# Patient Record
Sex: Female | Born: 1945 | Race: White | Hispanic: No | Marital: Married | Smoking: Never smoker
Health system: Southern US, Community
[De-identification: ages and names within clinical notes are randomized; demographics above are authoritative.]

## PROBLEM LIST (undated history)

## (undated) DIAGNOSIS — E119 Type 2 diabetes mellitus without complications: Secondary | ICD-10-CM

## (undated) DIAGNOSIS — C55 Malignant neoplasm of uterus, part unspecified: Secondary | ICD-10-CM

## (undated) DIAGNOSIS — Z950 Presence of cardiac pacemaker: Secondary | ICD-10-CM

## (undated) DIAGNOSIS — E782 Mixed hyperlipidemia: Secondary | ICD-10-CM

## (undated) DIAGNOSIS — I2510 Atherosclerotic heart disease of native coronary artery without angina pectoris: Secondary | ICD-10-CM

## (undated) DIAGNOSIS — E785 Hyperlipidemia, unspecified: Secondary | ICD-10-CM

## (undated) DIAGNOSIS — I509 Heart failure, unspecified: Secondary | ICD-10-CM

## (undated) DIAGNOSIS — I10 Essential (primary) hypertension: Secondary | ICD-10-CM

## (undated) DIAGNOSIS — G4730 Sleep apnea, unspecified: Secondary | ICD-10-CM

## (undated) DIAGNOSIS — J189 Pneumonia, unspecified organism: Secondary | ICD-10-CM

## (undated) DIAGNOSIS — I119 Hypertensive heart disease without heart failure: Secondary | ICD-10-CM

## (undated) DIAGNOSIS — J45909 Unspecified asthma, uncomplicated: Secondary | ICD-10-CM

## (undated) DIAGNOSIS — N200 Calculus of kidney: Secondary | ICD-10-CM

## (undated) DIAGNOSIS — I480 Paroxysmal atrial fibrillation: Secondary | ICD-10-CM

## (undated) DIAGNOSIS — G459 Transient cerebral ischemic attack, unspecified: Secondary | ICD-10-CM

## (undated) HISTORY — DX: Hyperlipidemia, unspecified: E78.5

## (undated) HISTORY — DX: Essential (primary) hypertension: I10

## (undated) HISTORY — DX: Transient cerebral ischemic attack, unspecified: G45.9

## (undated) HISTORY — PX: LAPAROSCOPIC CHOLECYSTECTOMY: SUR755

## (undated) HISTORY — DX: Malignant neoplasm of uterus, part unspecified: C55

## (undated) HISTORY — PX: INSERT / REPLACE / REMOVE PACEMAKER: SUR710

---
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
## 1973-03-17 HISTORY — PX: VAGINAL HYSTERECTOMY: SUR661

## 2003-03-18 HISTORY — PX: CORONARY ARTERY BYPASS GRAFT: SHX141

## 2003-03-18 HISTORY — PX: CARDIAC CATHETERIZATION: SHX172

## 2003-09-10 ENCOUNTER — Inpatient Hospital Stay (HOSPITAL_COMMUNITY): Admission: EM | Admit: 2003-09-10 | Discharge: 2003-09-15 | Payer: Self-pay | Admitting: Emergency Medicine

## 2003-10-18 ENCOUNTER — Inpatient Hospital Stay (HOSPITAL_COMMUNITY)
Admission: RE | Admit: 2003-10-18 | Discharge: 2003-10-26 | Payer: Self-pay | Admitting: Thoracic Surgery (Cardiothoracic Vascular Surgery)

## 2005-10-10 ENCOUNTER — Ambulatory Visit: Payer: Self-pay | Admitting: Cardiology

## 2010-05-23 ENCOUNTER — Emergency Department (HOSPITAL_COMMUNITY): Payer: Medicaid Other

## 2010-05-23 ENCOUNTER — Observation Stay (HOSPITAL_COMMUNITY)
Admission: EM | Admit: 2010-05-23 | Discharge: 2010-05-27 | Disposition: A | Discharge status: Home / Self Care | Payer: Medicaid Other | Attending: Internal Medicine | Admitting: Internal Medicine

## 2010-05-23 DIAGNOSIS — I10 Essential (primary) hypertension: Secondary | ICD-10-CM | POA: Insufficient documentation

## 2010-05-23 DIAGNOSIS — I2510 Atherosclerotic heart disease of native coronary artery without angina pectoris: Secondary | ICD-10-CM | POA: Insufficient documentation

## 2010-05-23 DIAGNOSIS — G4733 Obstructive sleep apnea (adult) (pediatric): Secondary | ICD-10-CM | POA: Insufficient documentation

## 2010-05-23 DIAGNOSIS — Z7982 Long term (current) use of aspirin: Secondary | ICD-10-CM | POA: Insufficient documentation

## 2010-05-23 DIAGNOSIS — Z79899 Other long term (current) drug therapy: Secondary | ICD-10-CM | POA: Insufficient documentation

## 2010-05-23 DIAGNOSIS — R6889 Other general symptoms and signs: Secondary | ICD-10-CM | POA: Insufficient documentation

## 2010-05-23 DIAGNOSIS — Z794 Long term (current) use of insulin: Secondary | ICD-10-CM | POA: Insufficient documentation

## 2010-05-23 DIAGNOSIS — Z23 Encounter for immunization: Secondary | ICD-10-CM | POA: Insufficient documentation

## 2010-05-23 DIAGNOSIS — E119 Type 2 diabetes mellitus without complications: Secondary | ICD-10-CM | POA: Insufficient documentation

## 2010-05-23 DIAGNOSIS — G459 Transient cerebral ischemic attack, unspecified: Principal | ICD-10-CM | POA: Insufficient documentation

## 2010-05-23 LAB — URINALYSIS, ROUTINE W REFLEX MICROSCOPIC
Hgb urine dipstick: NEGATIVE
Urobilinogen, UA: 1 mg/dL (ref 0.0–1.0)

## 2010-05-23 LAB — COMPREHENSIVE METABOLIC PANEL
Albumin: 3.9 g/dL (ref 3.5–5.2)
Alkaline Phosphatase: 69 U/L (ref 39–117)
BUN: 13 mg/dL (ref 6–23)
CO2: 31 mEq/L (ref 19–32)
Creatinine, Ser: 0.77 mg/dL (ref 0.4–1.2)
GFR calc non Af Amer: >60 mL/min (ref >60)
Potassium: 3.7 mEq/L (ref 3.5–5.1)
Sodium: 135 mEq/L (ref 135–145)
Total Bilirubin: 0.5 mg/dL (ref 0.3–1.2)

## 2010-05-23 LAB — APTT: aPTT: 29 seconds (ref 24–37)

## 2010-05-23 LAB — TROPONIN I: Troponin I: 0.01 ng/mL (ref 0.00–0.06)

## 2010-05-23 LAB — CBC
RBC: 4.39 MIL/uL (ref 3.87–5.11)
WBC: 11.4 10*3/uL — ABNORMAL HIGH (ref 4.0–10.5)

## 2010-05-23 LAB — PROTIME-INR
INR: 0.98 (ref 0.00–1.49)
Prothrombin Time: 13.2 seconds (ref 11.6–15.2)

## 2010-05-23 LAB — CK TOTAL AND CKMB (NOT AT ARMC)
CK, MB: 1.4 ng/mL (ref 0.3–4.0)
Relative Index: INVALID (ref 0.0–2.5)

## 2010-05-23 LAB — DIFFERENTIAL
Basophils Relative: 0 % (ref 0–1)
Eosinophils Absolute: 0.1 10*3/uL (ref 0.0–0.7)

## 2010-05-24 ENCOUNTER — Other Ambulatory Visit (HOSPITAL_COMMUNITY): Payer: Self-pay

## 2010-05-24 ENCOUNTER — Encounter (HOSPITAL_COMMUNITY): Payer: Self-pay | Admitting: Radiology

## 2010-05-24 ENCOUNTER — Inpatient Hospital Stay (HOSPITAL_COMMUNITY): Payer: Medicaid Other

## 2010-05-24 LAB — LIPID PANEL
HDL: 42 mg/dL (ref >39)
Triglycerides: 614 mg/dL — ABNORMAL HIGH (ref <150)
VLDL: UNDETERMINED mg/dL (ref 0–40)

## 2010-05-24 LAB — BRAIN NATRIURETIC PEPTIDE: Pro B Natriuretic peptide (BNP): 51 pg/mL (ref 0.0–100.0)

## 2010-05-24 LAB — GLUCOSE, CAPILLARY
Glucose-Capillary: 134 mg/dL — ABNORMAL HIGH (ref 70–99)
Glucose-Capillary: 137 mg/dL — ABNORMAL HIGH (ref 70–99)
Glucose-Capillary: 141 mg/dL — ABNORMAL HIGH (ref 70–99)

## 2010-05-24 LAB — URINE CULTURE
Culture  Setup Time: 201203082029
Culture: NO GROWTH

## 2010-05-24 LAB — HEMOGLOBIN A1C: Mean Plasma Glucose: 177 mg/dL — ABNORMAL HIGH (ref <117)

## 2010-05-24 MED ORDER — GADOBENATE DIMEGLUMINE 529 MG/ML IV SOLN
20.0000 mL | Freq: Once | INTRAVENOUS | Status: AC
  Administered 2010-05-24: 20 mL via INTRAVENOUS

## 2010-05-25 ENCOUNTER — Observation Stay (HOSPITAL_COMMUNITY): Payer: Medicaid Other

## 2010-05-25 DIAGNOSIS — G459 Transient cerebral ischemic attack, unspecified: Secondary | ICD-10-CM

## 2010-05-25 LAB — COMPREHENSIVE METABOLIC PANEL
Alkaline Phosphatase: 59 U/L (ref 39–117)
BUN: 13 mg/dL (ref 6–23)
Calcium: 9.5 mg/dL (ref 8.4–10.5)
Creatinine, Ser: 0.69 mg/dL (ref 0.4–1.2)
Glucose, Bld: 158 mg/dL — ABNORMAL HIGH (ref 70–99)
Total Protein: 6.3 g/dL (ref 6.0–8.3)

## 2010-05-25 LAB — CBC
HCT: 37.7 % (ref 36.0–46.0)
MCH: 29.5 pg (ref 26.0–34.0)
MCHC: 32.9 g/dL (ref 30.0–36.0)
MCV: 89.5 fL (ref 78.0–100.0)
RDW: 14.8 % (ref 11.5–15.5)

## 2010-05-25 LAB — GLUCOSE, CAPILLARY

## 2010-05-25 LAB — DIFFERENTIAL
Eosinophils Relative: 2 % (ref 0–5)
Lymphocytes Relative: 42 % (ref 12–46)
Lymphs Abs: 3.7 10*3/uL (ref 0.7–4.0)
Monocytes Absolute: 0.6 10*3/uL (ref 0.1–1.0)

## 2010-05-25 LAB — PHOSPHORUS: Phosphorus: 5.2 mg/dL — ABNORMAL HIGH (ref 2.3–4.6)

## 2010-05-25 LAB — MAGNESIUM: Magnesium: 1.8 mg/dL (ref 1.5–2.5)

## 2010-05-26 LAB — PHOSPHORUS: Phosphorus: 4.6 mg/dL (ref 2.3–4.6)

## 2010-05-26 LAB — BASIC METABOLIC PANEL
BUN: 12 mg/dL (ref 6–23)
Chloride: 101 mEq/L (ref 96–112)
Potassium: 4 mEq/L (ref 3.5–5.1)

## 2010-05-26 LAB — MAGNESIUM: Magnesium: 1.9 mg/dL (ref 1.5–2.5)

## 2010-05-26 LAB — GLUCOSE, CAPILLARY: Glucose-Capillary: 195 mg/dL — ABNORMAL HIGH (ref 70–99)

## 2010-05-27 ENCOUNTER — Other Ambulatory Visit (HOSPITAL_COMMUNITY): Payer: Self-pay

## 2010-06-17 NOTE — H&P (Signed)
NAMEBUFFY, EHLER              ACCOUNT NO.:  [id number]  MEDICAL RECORD NO.:  [id number]           PATIENT TYPE:  E  LOCATION:  [facility]                         FACILITY:  [facility]  PHYSICIAN:  Rise Patience, MDDATE OF BIRTH:  [birth year]  DATE OF ADMISSION:  05/23/2010 DATE OF DISCHARGE:                             HISTORY & PHYSICAL   PRIMARY CARE PHYSICIAN:  Dr. [name].  CHIEF COMPLAINT:  Left-sided numbness.  HISTORY OF PRESENT ILLNESS:  A 65 year old female with history of CAD status post CABG, history of atrial fibrillation status post Maze therapy, history of diabetes mellitus type 2, has been experiencing left- sided numbness for last 24 hours, the patient states whenever she stands up, she experiences whole left upper and lower extremity and body getting numb with also some facial numbness.  If she lies on flat, the symptoms resolves.  She has not lost functional for upper or lower extremities, did not loose consciousness, had no difficulty with swallowing, speaking, or any visual symptoms.  Denies any headaches at this time.  Denies chest pain, shortness of breath, cough or phlegm, abdominal pain, dysuria, discharge, or diarrhea.  The patient had a CT of the head which was negative.  Dr. Jannifer Franklin of Neurology had already seen the patient and has advised observation to rule out TIA with MRI and MRA of the brain.  PAST MEDICAL HISTORY:  History of CAD status post CABG, atrial fibrillation status post Maze therapy, presently in AFib, history of hypertension, and diabetes mellitus type 2.  PAST SURGICAL HISTORY:  CAD, status post CABG, Maze therapy, and cholecystectomy.  MEDICATIONS PRIOR TO ADMISSION: 1. Metoprolol 50 mg p.o. daily. 2. Lovaza 1 g 4 capsules daily. 3. NovoLog mix 70/30, takes 20 units subcutaneous twice daily. 4. Vitamin B 1000 units daily. 5. Aspirin 81 mg daily. 6. Vitamin B12. 7. Potassium chloride. 8. Furosemide 40 mg daily. 9.  Metformin 500 mg twice daily.  ALLERGIES:  No known drug allergies.  FAMILY HISTORY:  Significant for the patient's dad having dementia and stroke.  SOCIAL HISTORY:  The patient lives with her husband.  Denies smoking cigarette, drink alcohol, or use illegal drugs.  REVIEW OF SYSTEMS:  As per history of present illness, nothing else significant.  PHYSICAL EXAMINATION:  GENERAL:  The patient examined at bedside, not in acute distress. VITAL SIGNS:  Blood pressure is 148/85, pulse is 26 per minute, temperature 97.5, respirations 18, and O2 sat is 97%. HEENT:  Anicteric.  No pallor.  No facial asymmetry.  Tongue is midline. NECK:  No neck rigidity. CHEST:  Bilateral air entry present.  No rhonchi.  No crepitation. HEART:  S1 and S2 heard. ABDOMEN:  Soft and nontender.  Bowel sounds heard. CNS:  Alert, awake, oriented to time, place, and person.  Moves upper and lower extremities 5/5.  There is no pronator drift.  There is no dysarthria or ataxia.  These were done when the patient was sitting and lying down. EXTREMITIES:  Peripheral pulses felt.  No edema.  LABORATORY DATA:  EKG shows atrial fibrillation with rate around 108 beats per minute with nonspecific ST-T changes.  There has been another EKG done later which shows atrial fibrillation, rate around 72 beats per minute with nonspecific ST-T changes.  CT of the head without contrast shows no acute intracranial abnormalities.  Chest x-rays shows thyromegaly, mild pulmonary vascular congestion, and diffuse interstitial prominence.  Interstitial prominence could be chronic or could reflect mild interstitial pulmonary edema.  CBC, WBC is 11.4, hemoglobin 13.2, hematocrit 39.1, and platelets 286.  PT/INR is 13.2 and 0.9.  Basic metabolic panel, sodium 980, potassium 3.5, chloride 97, carbon dioxide 31, glucose 145, BUN 13, creatinine 0.7, total bilirubin 0.5, alk phos 69, AST 40, ALT 34, total bilirubin 7.3, albumin 3.9, calcium  8.2, CK is 60, CK-MB 1.4, troponin 0.1.  UA is negative for glucose, ketones 15, otherwise negative nitrites and leukocytes.  ASSESSMENT: 1. Possible transient ischemic attack. 2. Abnormal chest x-ray. 3. History of diabetes mellitus type 2. 4. History of coronary artery disease status post coronary artery     bypass graft. 5. History of atrial fibrillation status post Maze therapy.  Presently     heart rate is moderately controlled. 6. History of hyperlipidemia.  PLAN: 1. At this time, we will admit the patient to telemetry. 2. For her possible TIA, that has been already evaluated by Dr.     Jannifer Franklin.  We will be getting MRI and MRA of the brain, 2D echo,     carotid Doppler and at this time I did discuss with Dr. Jannifer Franklin,     about AFib.  We will observe the patient, not to start on any     Coumadin at this time. 3. Abnormal chest x-ray.  I am going to check BNP.  The patient is not     short of breath.  The patient does say that she gets cough everyday     in the morning probably has mild bronchitis.  I am going to start     Avelox 400 mg p.o. daily and we will also get a CT chest without     contrast to rule out any further abnormal disease of the lung like     interstitial lung disease.  If the BNP is high, then probably we     will need to follow her 2D echo. 4. Further recommendations as condition evolves. 5. The patient is a full code.     Rise Patience, MD     ANK/MEDQ  D:  05/23/2010  T:  05/24/2010  Job:  [id number]  cc:   [name]  Electronically Signed by Gean Birchwood MD on 06/17/2010 07:39:23 AM

## 2010-06-18 NOTE — Consult Note (Signed)
[name], Paul              ACCOUNT NO.:  [id number]  MEDICAL RECORD NO.:  [id number]           PATIENT TYPE:  I  LOCATION:  3012                         FACILITY:  [facility]  PHYSICIAN:  Jill Alexanders, M.D.  DATE OF BIRTH:  11-22-1945  DATE OF CONSULTATION:  05/23/2010 DATE OF DISCHARGE:                                CONSULTATION   HISTORY OF PRESENT ILLNESS:  [name] is a 65 year old right- handed white female born on 01-17-1946.  This patient does have history diabetes and hypertension, comes into the emergency room for evaluation of episodes of left-sided numbness that began this morning. The patient was last seen normal on the evening of the May 22, 2010. The patient claims when she woke this morning she noted some right occipital headache.  Since she got out of bed, the patient felt numbness on the face, arm, and leg on the left.  When the patient tried to walk, she would veer to the left, felt as if her left foot was floppy.  The patient did not fall.  The patient notes when she lies down the numbness goes away, but comes back again when she stands up.  The patient denies any visual field changes, still has a bit of a headache on the top of her head.  The patient has undergone a CT scan of the brain that is unremarkable, but does show calcification within the posterior circulation vessels.  Neurology was asked to see this patient for further evaluation.  The patient has no symptoms while lying down.  NIH stroke scale score is zero.  PAST MEDICAL HISTORY:  Significant for: 1. Diabetes. 2. TIA events with left-sided numbness. 3. CABG procedure. 4. Obesity. 5. Hysterectomy. 6. Gallbladder resection. 7. Multiple episodes of pneumonia. 8. History of left face shingles in the past.  MEDICATIONS: 1. NPH 20 units daily. 2. Lasix 40 mg daily. 3. Metformin 500 mg twice daily. 4. Metoprolol 50 mg daily. 5. Potassium 10 mEq daily. 6. Vitamin D 1000 units  daily. 7. B complex vitamins 1 tablet daily. 8. Aspirin 81 mg daily. 9. Lovaza 1000 mg 2 tablets twice daily.  The patient has no known allergies.  SOCIAL HISTORY:  The patient is married; is retired; lives in the [place] area; has two children.  One daughter has degenerative arthritis.  One sister has obesity.  Does not smoke or drink.  FAMILY MEDICAL HISTORY:  Notable that mother is still living with history of coronary artery disease, status post CABG procedure.  Father died with Alzheimer disease, strokes, heart attack.  The patient has one brother, four sisters, siblings have hypertension.  REVIEW OF SYSTEMS:  Notable for no recent fevers, chills.  The patient does note headache.  Denies shortness of breath, chest pains, abdominal pain, nausea, vomiting.  The patient denies any blackout episodes or seizure event.  The patient denies any preexisting history of cerebrovascular disease.  PHYSICAL EXAMINATION:  VITAL SIGNS:  Blood pressure is 136/75, heart rate 68, respiratory rate 20, temperature afebrile.GENERAL:  The patient is a moderately to markedly obese white female who is alert and  cooperative at the time of examination. HEENT:  Head is atraumatic.  Eyes, pupils are equal, round, and reactive to light. NECK:  Supple.  No carotid bruits noted. RESPIRATORY:  Clear. CARDIOVASCULAR:  Regular rate and rhythm.  No obvious murmurs or rubs noted. EXTREMITIES:  Without significant edema. NEUROLOGIC:  Cranial nerves as above.  Facial symmetry is present.  The patient has good sensation of face to pinprick, soft touch bilaterally. She has good strength in facial muscle, muscles of head and trunk, and shoulder shrug bilaterally.  Speech is well enunciated, not aphasic. Motor testing reveals 5/5 strength in all fours.  Good symmetric motor tone is noted throughout.  Sensory testing is intact to pinprick, soft touch, and vibratory sensation throughout.  The  patient has good finger- nose-finger, heel-to-shin.  Gait was not tested.  Deep tendon reflexes are depressed but symmetric.  Toes neutral bilaterally.  No drift seen on the arms or legs.  No evidence of extinction to double simultaneous stimulation is noted.  LABORATORY DATA:  Notable for a white count of 11.4, hemoglobin of 13.2, hematocrit 39.1, MCV of 89.1, platelets of 286, INR 0.98.  Sodium 135, potassium 3.7, chloride of 97, CO2 of 31, glucose of 145, BUN of 13, creatinine of 0.77, total bili 0.5, alk phosphatase 69, SGOT 40, SGPT of 34, total protein 7.3, albumin 3.9, calcium 8.7.  Specific gravity 1.025, pH 5.5, 15 mg/dL ketones, otherwise unremarkable.  IMPRESSION: 1. Probable transient ischemic attack events. 2. Diabetes. 3. Hypertension.  This patient has had multiple events today.  Apparently, all are with standing involving the left face, arm, and leg with some possible weakness on that side as well and gait instability.  The symptoms resolve with lying down.  This patient could be having minor drops in blood pressure with standing associated with a fixed vessel stenosis that resulted in symptomatology.  The patient will deserve further workup for stroke.  NIH stroke scale score is zero.  TPA is not indicated secondary to minimal deficit and duration of symptoms.  1. The patient will be admitted for evaluation. 2. MRI of the head. 3. MRI angiogram of the head and neck. 4. 2D echocardiogram. 5. Aspirin therapy for now.  The patient will be followed while in-house.     Jill Alexanders, M.D.     CKW/MEDQ  D:  05/23/2010  T:  05/24/2010  Job:  [id number]  cc:   [name] [facility]  Electronically Signed by Roxy Cedar M.D. on 06/18/2010 08:29:36 AM

## 2010-06-25 NOTE — Discharge Summary (Signed)
[name], [name]              ACCOUNT NO.:  [id number]  MEDICAL RECORD NO.:  [id number]           PATIENT TYPE:  I  LOCATION:  3012                         FACILITY:  [facility]  PHYSICIAN:  Leana Gamer, MDDATE OF BIRTH:  03/04/1946  DATE OF ADMISSION:  05/23/2010 DATE OF DISCHARGE:  05/27/2010                              DISCHARGE SUMMARY   DISCHARGE DISPOSITION:  Home.  FINAL DISCHARGE DIAGNOSES: 1. Transient ischemic attack. 2. Atrial fibrillation. 3. Diabetes type 2. 4. Hypertension. 5. Abnormal lipids. 6. Coronary artery disease, status post CABG. 7. Numbness on the left lower extremity. 8. Chronic diastolic dysfunction. 9. Right external carotid artery stenosis.  DISCHARGE MEDICATIONS: 1. Aspirin 325 mg p.o. daily. 2. Pravastatin 10 mg p.o. nightly. 3. Lasix 40 mg p.o. daily. 4. Lovaza 1 g 4 capsules p.o. daily. 5. Metformin 500 mg p.o. b.i.d. 6. Metoprolol 50 mg p.o. daily. 7. NovoLog 70/30 20 units subcutaneously b.i.d. 8. Potassium 10 mEq p.o. daily. 9. Vitamin B12 over-the-counter 1 tab p.o. daily. 10.Vitamin D 1000 units p.o. daily.  CONSULTANTS:  Pramod P. Leonie Man, MD, Neurology.  PROCEDURES:  None.  DIAGNOSTIC STUDIES: 1. A 2-D echocardiogram which shows systolic function retained with an     ejection fraction of 65-70% and mild left ventricular hypertrophy.     Impression:  Grade 2 diastolic dysfunction. 2. Bilateral carotid duplex which shows no evidence of left external     carotid artery stenosis and the right ECA demonstrates stenosis.     The vertebral flow is antegrade bilaterally. 3. Two-view chest x-ray on admission which shows mild pulmonary     vascular congestion diffuse interstitial prominence which could be     chronic and could reflect mild interstitial pulmonary edema. 4. CT of the head without contrast which shows no acute intracranial     abnormality. 5. CT of the chest without contrast which shows mild cardiomegaly and  biatrial prominence, aortic valve calcifications and mitral annular     stenosis.  Prior CABG.  Negative for pulmonary edema.  Linear scar     atelectasis in the lingula. 6. MRI of the brain with and without contrast which shows mild     atherosclerotic disease in the carotid artery bilaterally without     significant carotid stenosis. 7. Probable severe stenosis at the origin of the right vertebral     artery, this could be due to artifact. 8. MRA of the head and neck which shows mild atherosclerotic disease     in the carotid artery bilaterally without carotid stenosis.     Probable severe stenosis at the origin of the right vertebral     artery, although this could be due to artifact.  CODE STATUS:  Full code.  ALLERGIES:  No known drug allergies.  PRIMARY CARE PHYSICIAN:  Dr. [name].  CHIEF COMPLAINT:  Left-sided numbness and tingling.  HISTORY OF PRESENT ILLNESS:  Please refer to the H and P by Dr. Hal Hope for details of the HPI.  However in short, this patient is a 65 year old with multiple risk factors including atrial fibrillation and diabetes who presented with complaints of left-sided numbness  for 24 hours.  HOSPITAL COURSE: 1. TIA.  The patient presented with left-sided numbness which resolved     spontaneously.  The patient went to a workup to evaluate for stroke     and no CVA was found.  Risk stratification was done on the patient     and modifications include adding a statin to her medications.  The     patient was in atrial fibrillation when she arrived to the hospital     and I discussed the possibilities of Coumadin with the patient.     The patient is adamant that she does not want to start Coumadin at     this time as she had a maze procedure and feels that keeping her     heart rate under control is as much she needs to do.  She has     agreed that she will discuss with her primary care physician, Dr.     [name] who is known for some time  and between Dr. Melina Copa     and Dr. Alvan Dame, we will make a decision about her being on Coumadin.     Thus at this time, this patient is being discharged without     Coumadin.  However, her heart rate is well controlled at 66 and     please note that currently the patient is in sinus rhythm. 2. Diabetes type 2.  The patient is continued on her usual     medications, and I will defer to her primary care physician to     further titrate her medications as outpatient.  However, her     hemoglobin A1c was elevated to 7.8 showing poor control of diabetes     as an outpatient. 3. Hyperlipidemia.  The patient was found to have markedly elevated     triglycerides of 614 which is a independent risk factor for both     coronary artery disease and CVA.  The patient is on Lovaza.  I     spoke with the patient about starting the statin and we are going     to start her on Zocor 10 mg p.o. nightly.  The patient should have     her lipids rechecked in about 4 weeks with further titration of the     medication for maximum benefits of the medication. 4. Hypertension.  Blood pressures were adequately controlled with the     usual medications with a systolic in the range of 970Y.  CONDITION AT THE TIME OF DISCHARGE:  Stable.  PHYSICAL EXAMINATION:  VITAL SIGNS:  Temperature is 98, heart rate 66, blood pressure 123/75, respiratory rate 16, O2 sats 95% on room air. HEENT:  She is normocephalic, atraumatic.  Pupils are equally round and reactive to light and accommodation.  Extraocular movements are intact. Oropharynx is moist.  No exudate, erythema or lesions noted. NECK:  Trachea is midline.  No masses, no thyromegaly, no JVD, no carotid bruit. RESPIRATORY:  The patient has a normal respiratory effort with equal excursion bilaterally.  No wheezing or rhonchi noted. CARDIOVASCULAR:  She has got a normal S1 and S2.  No murmurs, rubs or gallops noted.  PMI is nondisplaced.  No heaves, thrills or rubs  on palpation. ABDOMEN:  Obese, soft, nontender and nondistended.  No masses, no hepatosplenomegaly. EXTREMITIES:  No clubbing, cyanosis or edema. PSYCHIATRIC:  She is alert and oriented x3.  Good insight and cognition. Good recent and remote recall.  FOLLOWUP:  The  patient is to follow up with her primary care physician, Dr. [name] in 1 week for further discussion regarding use of Coumadin.  The patient was scheduled to have a MR of her CT scan here in the hospital to evaluate for any spinal stenosis.  However, due to her claustrophobia, she actively refused the MR even with sedation.  So I will defer to her primary care physician, Dr. Melina Copa for reordering that MR of the cervical spine in an open MRI if she deems it necessary.  DIETARY RESTRICTIONS:  The patient should be on a heart-healthy diabetic diet.  PHYSICAL RESTRICTIONS:  Activity as tolerated.     Leana Gamer, MD     MAM/MEDQ  D:  05/27/2010  T:  05/28/2010  Job:  [id number]  cc:   [name]  Electronically Signed by Liston Alba MD on 06/25/2010 07:58:38 PM

## 2010-12-10 DIAGNOSIS — Z0271 Encounter for disability determination: Secondary | ICD-10-CM

## 2012-09-28 ENCOUNTER — Encounter: Payer: Self-pay | Admitting: Cardiovascular Disease

## 2012-09-28 ENCOUNTER — Encounter (HOSPITAL_COMMUNITY): Payer: Self-pay | Admitting: Cardiovascular Disease

## 2012-09-28 ENCOUNTER — Ambulatory Visit (INDEPENDENT_AMBULATORY_CARE_PROVIDER_SITE_OTHER): Payer: Medicare Other | Admitting: Cardiovascular Disease

## 2012-09-28 VITALS — BP 160/90 | Ht 64.5 in | Wt 239.6 lb

## 2012-09-28 DIAGNOSIS — R0989 Other specified symptoms and signs involving the circulatory and respiratory systems: Secondary | ICD-10-CM

## 2012-09-28 DIAGNOSIS — E119 Type 2 diabetes mellitus without complications: Secondary | ICD-10-CM | POA: Insufficient documentation

## 2012-09-28 DIAGNOSIS — I4891 Unspecified atrial fibrillation: Secondary | ICD-10-CM

## 2012-09-28 DIAGNOSIS — R0609 Other forms of dyspnea: Secondary | ICD-10-CM

## 2012-09-28 DIAGNOSIS — I2510 Atherosclerotic heart disease of native coronary artery without angina pectoris: Secondary | ICD-10-CM

## 2012-09-28 DIAGNOSIS — R5381 Other malaise: Secondary | ICD-10-CM

## 2012-09-28 DIAGNOSIS — R5383 Other fatigue: Secondary | ICD-10-CM

## 2012-09-28 DIAGNOSIS — Z6836 Body mass index (BMI) 36.0-36.9, adult: Secondary | ICD-10-CM | POA: Insufficient documentation

## 2012-09-28 DIAGNOSIS — I480 Paroxysmal atrial fibrillation: Secondary | ICD-10-CM | POA: Insufficient documentation

## 2012-09-28 DIAGNOSIS — E6609 Other obesity due to excess calories: Secondary | ICD-10-CM | POA: Insufficient documentation

## 2012-09-28 DIAGNOSIS — E782 Mixed hyperlipidemia: Secondary | ICD-10-CM | POA: Insufficient documentation

## 2012-09-28 DIAGNOSIS — Z951 Presence of aortocoronary bypass graft: Secondary | ICD-10-CM | POA: Insufficient documentation

## 2012-09-28 MED ORDER — FENOFIBRATE 160 MG PO TABS: 160.0000 mg | ORAL_TABLET | Freq: Every day | ORAL | Status: DC

## 2012-09-28 MED ORDER — METOPROLOL SUCCINATE ER 50 MG PO TB24: ORAL_TABLET | ORAL | Status: DC

## 2012-09-28 MED ORDER — LOSARTAN POTASSIUM 50 MG PO TABS: 50.0000 mg | ORAL_TABLET | Freq: Every day | ORAL | Status: DC

## 2012-09-28 MED ORDER — OMEGA-3-ACID ETHYL ESTERS 1 G PO CAPS: 2.0000 g | ORAL_CAPSULE | Freq: Two times a day (BID) | ORAL | Status: DC

## 2012-09-28 NOTE — Patient Instructions (Signed)
Your physician recommends that you return for lab work in: 2-3 weeks.  Your physician has recommended that you have a sleep study. This test records several body functions during sleep, including: brain activity, eye movement, oxygen and carbon dioxide blood levels, heart rate and rhythm, breathing rate and rhythm, the flow of air through your mouth and nose, snoring, body muscle movements, and chest and belly movement.  Your physician has requested that you have an echocardiogram. Echocardiography is a painless test that uses sound waves to create images of your heart. It provides your doctor with information about the size and shape of your heart and how well your heart's chambers and valves are working. This procedure takes approximately one hour. There are no restrictions for this procedure.  Your physician has requested that you have a lexiscan myoview. For further information please visit [email or web address]. Please follow instruction sheet, as given.  Your physician has recommended you make the following change in your medication: [place]. START NEW PRESRIPTION  FOR LOSARTAN 50 MG. METOPROLOL 50 MG TAKE 1 AND 1/2 TABLET DAILY. These prescriptions has already been sent to your pharmacy.  Your physician recommends that you schedule a follow-up appointment in: 4 WEEKS.

## 2012-09-28 NOTE — Progress Notes (Signed)
Patient ID: [name], female   DOB: 01/14/1946, 67 y.o.   MRN: [id number]     PATIENT PROFILE:  [name] is a 67 year old female who is referred to me courtesy of Dr. [name] for cardiology evaluation.   HPI: [name] is a 13 -year-old female who admits to a history of diabetes mellitus since 2005. Apparently he also underwent CABG revascularization surgery x2 at that time by Dr. Roxy Manns. She states she also had a means procedure for atrial fibrillation. Remotely she had been followed by Dr. began to appear she does not recall any recent stress test or echo studies she states approximately ago she experienced 2 episodes of palpitations where she felt her heart was temporarily out of rhythm. She denied associated chest tightness. She does note some shortness of breath. On further questioning, the patient does admit to poor sleep. She wakes up several times per night. She does snore loudly. Her sleep is nonrestorative.  She does note some mild leg swelling.  Past Medical History  Diagnosis Date  . Hypertension   . Hyperlipidemia   . Uterine cancer   . Coronary artery disease     Past Surgical History  Procedure Laterality Date  . Abdominal hysterectomy    . Coronary artery bypass graft  2005  . Gallblader      No Known Allergies  Current Outpatient Prescriptions  Medication Sig Dispense Refill  . aspirin 325 MG EC tablet Take 325 mg by mouth daily.      . B Complex-Biotin-FA (B-COMPLEX PO) Take by mouth.      . Cholecalciferol (VITAMIN D3) 2000 UNITS TABS Take by mouth daily.      [name] co-enzyme Q-10 30 MG capsule Take 30 mg by mouth 3 (three) times daily.      . furosemide (LASIX) 40 MG tablet Take 40 mg by mouth 2 (two) times daily. Prn      . gabapentin (NEURONTIN) 100 MG capsule Take 100 mg by mouth daily. 1-2 tablets      . insulin aspart protamine- aspart (NOVOLOG MIX 70/30) (70-30) 100 UNIT/ML injection Inject into the skin 2 (two) times daily with a  meal. 25 units      . metFORMIN (GLUCOPHAGE) 500 MG tablet Take 500 mg by mouth 2 (two) times daily with a meal. 2 tablets      . potassium chloride (K-DUR,KLOR-CON) 10 MEQ tablet Take 10 mEq by mouth daily.      . fenofibrate 160 MG tablet Take 1 tablet (160 mg total) by mouth daily.  30 tablet  6  . losartan (COZAAR) 50 MG tablet Take 1 tablet (50 mg total) by mouth daily.  30 tablet  3  . metoprolol succinate (TOPROL XL) 50 MG 24 hr tablet Take 1 & 1/2 tablet daily.  45 tablet  6  . omega-3 acid ethyl esters (LOVAZA) 1 G capsule Take 2 capsules (2 g total) by mouth 2 (two) times daily.  120 capsule  6   No current facility-administered medications for this visit.    Socially she is married to [employer] She denied 47 years. Has 2 children one grandchild. She is retired. She completed ninth grade education. Has no history of tobacco use. She is not drink alcohol. She does walk approximately 2-3 times per week.  Family History  Problem Relation Age of Onset  . Hypertension Mother   . Hyperlipidemia Mother   . Heart disease Mother   . Diabetes Father   .  Stroke Father   . Heart disease Father   . Hypertension Sister     3 sisters  . Arthritis Brother   . Heart attack Maternal Grandfather   . Stroke Paternal Grandfather   . Hypertension Daughter     ROS is negative for fever chills or night sweats. She admits to long-standing obesity per she denies presyncope or syncope. She denies wheezing she denies recent chest tightness. She is status post CABG surgery 9 years ago. She does admit to anginal indigestion. She denies change in bowel or bladder habits. She does note some mild leg swelling. She denies paresthesias. Other system review is negative.  PE BP 160/90 General: Alert, oriented, no distress. Normocephalic atraumatic.  Skin: normal turgor, no rashes HEENT: Normocephalic, atraumatic. Pupils round and reactive; sclera anicteric; bilateral xanthelasmas. Extraocular muscles  are intact. Fundi mild arteriolar narrowing without hemorrhages or exudates. Nose without nasal septal hypertrophy Mouth/Parynx benign; Mallinpatti scale at least a 3. Neck: No JVD, no carotid briuts Lungs: clear to ausculatation and percussion; no wheezing or rales Heart: RRR, s1 s2 normal 6 systolic murmur per Abdomen: Moderate obesity;soft, nontender; no hepatosplenomehaly, BS+; abdominal aorta nontender and not dilated by palpation. Pulses 2+; no bruits. Extremities: Trace edema bilaterally, no clubbinbg cyanosis, Homan's sign negative  Neurologic: grossly nonfocal Psychologic: Normal affect  ECG: Sinus rhythm at 69 beats per minute. QTc interval 445 ms.  LABS:  BMET    Component Value Date/Time   NA 138 05/26/2010 0348   K 4.0 HEMOLYZED SPECIMEN, RESULTS MAY BE AFFECTED 05/26/2010 0348   CL 101 05/26/2010 0348   CO2 28 05/26/2010 0348   GLUCOSE 131* 05/26/2010 0348   BUN 12 05/26/2010 0348   CREATININE 0.63 05/26/2010 0348   CALCIUM 9.2 05/26/2010 0348   GFRNONAA >60 05/26/2010 0348   GFRAA  Value: >60        The eGFR has been calculated using the MDRD equation. This calculation has not been validated in all clinical situations. eGFR's persistently <60 mL/min signify possible Chronic Kidney Disease. 05/26/2010 0348     Hepatic Function Panel     Component Value Date/Time   PROT 6.3 05/25/2010 0450   ALBUMIN 3.3* 05/25/2010 0450   AST 43* 05/25/2010 0450   ALT 35 05/25/2010 0450   ALKPHOS 59 05/25/2010 0450   BILITOT 0.6 05/25/2010 0450     CBC    Component Value Date/Time   WBC 8.8 05/25/2010 0450   RBC 4.21 05/25/2010 0450   HGB 12.4 05/25/2010 0450   HCT 37.7 05/25/2010 0450   PLT 253 05/25/2010 0450   MCV 89.5 05/25/2010 0450   MCH 29.5 05/25/2010 0450   MCHC 32.9 05/25/2010 0450   RDW 14.8 05/25/2010 0450   LYMPHSABS 3.7 05/25/2010 0450   MONOABS 0.6 05/25/2010 0450   EOSABS 0.2 05/25/2010 0450   BASOSABS 0.0 05/25/2010 0450     BNP    Component Value Date/Time   PROBNP  51.0 05/24/2010 0013    Lipid Panel     Component Value Date/Time   CHOL  Value: 223        ATP III CLASSIFICATION:  <200     mg/dL   Desirable  200-239  mg/dL   Borderline High  >=240    mg/dL   High       * 05/24/2010 0535   TRIG 614* 05/24/2010 0535   HDL 42 05/24/2010 0535   CHOLHDL 5.3 05/24/2010 0535   VLDL UNABLE TO CALCULATE IF TRIGLYCERIDE OVER  400 mg/dL 05/24/2010 0535   LDLCALC  Value: UNABLE TO CALCULATE IF TRIGLYCERIDE OVER 400 mg/dL        Total Cholesterol/HDL:CHD Risk Coronary Heart Disease Risk Table                     Men   Women  1/2 Average Risk   3.4   3.3  Average Risk       5.0   4.4  2 X Average Risk   9.6   7.1  3 X Average Risk  23.4   11.0        Use the calculated Patient Ratio above and the CHD Risk Table to determine the patient's CHD Risk.        ATP III CLASSIFICATION (LDL):  <100     mg/dL   Optimal  100-129  mg/dL   Near or Above                    Optimal  130-159  mg/dL   Borderline  160-189  mg/dL   High  >190     mg/dL   Very High 05/24/2010 0535     RADIOLOGY: No results found.   ASSESSMENT AND PLAN:  My impression is that Ms. Kato is a 67 year old female who has at least a 9 year history of type 2 diabetes mellitus and is status post CBG surgery x2 by Dr. Roxy Manns in 2005. She did have a history of atrial fibrillation and underwent a Maze procedure at the time of her surgery. She has noticed some recurrent episodes of sensation that her heart rate has been out of rhythm. Her EKG today, however, does show sinus rhythm. Recent laboratory by Dr. Jenny Reichmann about her has shown marked hyperlipidemia with total cholesterol 228, HDL cholesterol 41. Triglycerides were 750. On NMR lipid profile she had an LDL small particle number of 1347 her LDL particle number was [id number]. Presently, I'm recommending the addition of fenofibrate 160 mg per medical regimen and increasing her low positive for capsules daily in 8 to capsule twice a day regimen. I scheduled her for a 2-D echo Doppler study  to reassess her LV systolic and diastolic function as well as valvular architecture. She does have a 2/6 systolic murmur. She is now 9 years status post CBG anesthetization surgery and I'm scheduling her for a nuclear perfusion scan. I am  highly suspect that she may have significant sleep apnea which may be contributory to her potential for paroxysmal atrial fibrillation. I scheduled her for a diagnostic polysomnogram. Blood pressure is elevated. I am initiating ARB therapy particularly with her diabetes and we'll start her on losartan 50 mg daily. I will further titrate her beta blocker and increase her Toprol to 75 mg daily. I will see her in 4 weeks for followup evaluation.    Troy Sine, MD, [facility] 09/28/2012 12:34 PM

## 2012-09-29 ENCOUNTER — Encounter: Payer: Self-pay | Admitting: Cardiovascular Disease

## 2012-10-01 ENCOUNTER — Encounter: Payer: Self-pay | Admitting: Cardiovascular Disease

## 2012-10-05 ENCOUNTER — Ambulatory Visit (HOSPITAL_COMMUNITY)
Admission: RE | Admit: 2012-10-05 | Discharge: 2012-10-05 | Disposition: A | Discharge status: Home / Self Care | Payer: Medicare Other | Source: Ambulatory Visit | Attending: Cardiology | Admitting: Cardiology

## 2012-10-05 DIAGNOSIS — I2510 Atherosclerotic heart disease of native coronary artery without angina pectoris: Secondary | ICD-10-CM | POA: Insufficient documentation

## 2012-10-05 DIAGNOSIS — E785 Hyperlipidemia, unspecified: Secondary | ICD-10-CM | POA: Insufficient documentation

## 2012-10-05 DIAGNOSIS — I4891 Unspecified atrial fibrillation: Secondary | ICD-10-CM

## 2012-10-05 NOTE — Progress Notes (Signed)
2D Echo Performed 10/05/2012    Marygrace Drought, RCS

## 2012-10-07 ENCOUNTER — Ambulatory Visit (HOSPITAL_COMMUNITY)
Admission: RE | Admit: 2012-10-07 | Discharge: 2012-10-07 | Disposition: A | Discharge status: Home / Self Care | Payer: Medicare Other | Source: Ambulatory Visit | Attending: Cardiovascular Disease | Admitting: Cardiovascular Disease

## 2012-10-07 DIAGNOSIS — R079 Chest pain, unspecified: Secondary | ICD-10-CM | POA: Insufficient documentation

## 2012-10-07 DIAGNOSIS — R5381 Other malaise: Secondary | ICD-10-CM | POA: Insufficient documentation

## 2012-10-07 DIAGNOSIS — R5383 Other fatigue: Secondary | ICD-10-CM | POA: Insufficient documentation

## 2012-10-07 DIAGNOSIS — R42 Dizziness and giddiness: Secondary | ICD-10-CM | POA: Insufficient documentation

## 2012-10-07 DIAGNOSIS — I4891 Unspecified atrial fibrillation: Secondary | ICD-10-CM

## 2012-10-07 DIAGNOSIS — R0602 Shortness of breath: Secondary | ICD-10-CM | POA: Insufficient documentation

## 2012-10-07 DIAGNOSIS — I2510 Atherosclerotic heart disease of native coronary artery without angina pectoris: Secondary | ICD-10-CM

## 2012-10-07 DIAGNOSIS — R0609 Other forms of dyspnea: Secondary | ICD-10-CM | POA: Insufficient documentation

## 2012-10-07 DIAGNOSIS — R0989 Other specified symptoms and signs involving the circulatory and respiratory systems: Secondary | ICD-10-CM | POA: Insufficient documentation

## 2012-10-07 DIAGNOSIS — R002 Palpitations: Secondary | ICD-10-CM | POA: Insufficient documentation

## 2012-10-07 MED ORDER — TECHNETIUM TC 99M SESTAMIBI GENERIC - CARDIOLITE
30.0000 | Freq: Once | INTRAVENOUS | Status: AC | PRN
  Administered 2012-10-07: 30 via INTRAVENOUS

## 2012-10-07 MED ORDER — TECHNETIUM TC 99M SESTAMIBI GENERIC - CARDIOLITE
10.0000 | Freq: Once | INTRAVENOUS | Status: AC | PRN
  Administered 2012-10-07: 10 via INTRAVENOUS

## 2012-10-07 NOTE — Procedures (Addendum)
[facility] [address] [place] [address] [phone]  Cardiology Nuclear Med Study  [name] is a 67 y.o. female     MRN : [id number]     DOB: January 24, 1946  Procedure Date: 10/07/2012  Nuclear Med Background Indication for Stress Test:  Graft Patency History:  CAD, CABG x2 2005 Cardiac Risk Factors: Family History - CAD, Hypertension, IDDM Type 2, Lipids, Obesity and TIA  Symptoms:  Chest Pain, Dizziness, DOE, Fatigue, Light-Headedness, Palpitations and SOB   Nuclear Pre-Procedure Caffeine/Decaff Intake:  7:00pm NPO After: 5:00am   IV Site: R Forearm  IV 0.9% NS with Angio Cath:  22g  Chest Size (in):  n/a IV Started by: Azucena Cecil, RN  Height: _0  (1.626 m)  Cup Size: C  BMI:  Body mass index is 41.18 kg/(m^2). Weight:  240 lb (108.863 kg)   Tech Comments:  Pt held metoprolol 24 hrs prior to testing    Nuclear Med Study 1 or 2 day study: 1 day  Stress Test Type:  [place] Provider:  Shelva Majestic, MD   Resting Radionuclide: Technetium 31mSestamibi  Resting Radionuclide Dose: 10.8 mCi   Stress Radionuclide:  Technetium 945mestamibi  Stress Radionuclide Dose: 30.2 mCi           Stress Protocol Rest HR: 74 Stress HR: 137  Rest BP: 208/96 Stress BP: 216/99  Exercise Time (min): 4:00 METS: 5.8   Predicted Max HR: 154 bpm % Max HR: 88.96 bpm Rate Pressure Product: 29592  Dose of Adenosine (mg):  n/a Dose of Lexiscan: n/a mg  Dose of Atropine (mg): n/a Dose of Dobutamine: n/a mcg/kg/min (at max HR)  Stress Test Technologist: TeLeane ParaCCT Nuclear Technologist: RoOtho PerlCNMT   Rest Procedure:  Myocardial perfusion imaging was performed at rest 45 minutes following the intravenous administration of Technetium 9934mstamibi. Stress Procedure:  The patient performed treadmill exercise using a Bruce  Protocol for 4 minutes. The patient stopped due to SOB and Leg Fatigue  and denied any chest pain.  There were no significant ST-T wave changes.  Technetium 3m85mtamibi was injected at peak exercise and myocardial perfusion imaging was performed after a brief delay.  Transient Ischemic Dilatation (Normal <1.22):  0.90 Lung/Heart Ratio (Normal <0.45):  0.41 QGS EDV:  n/a ml QGS ESV:  n/a ml LV Ejection Fraction: Study not gated  Signed by      Rest ECG: NSR - Normal EKG  Stress ECG: No ischemic changes with runs of NSVT  QPS Raw Data Images:  Normal; no motion artifact; normal heart/lung ratio. Stress Images:  There is decreased uptake in the anterior wall. Rest Images:  There is decreased uptake in the anterior wall. Subtraction (SDS):  There is a fixed anteriour defect that is most consistent with breast attenuation.  Impression Exercise Capacity:  Fair exercise capacity. BP Response:  Normal blood pressure response. Clinical Symptoms:  No significant symptoms noted. ECG Impression:  No significant ST segment change suggestive of ischemia. Comparison with Prior Nuclear Study: No images to compare  Overall Impression:  Low risk stress nuclear study Breast attenuation artifact.  LV Wall Motion:  NL LV Function; NL Wall Motion   [name]  10/07/2012 6:15 PM

## 2012-10-08 DIAGNOSIS — G4733 Obstructive sleep apnea (adult) (pediatric): Secondary | ICD-10-CM

## 2012-10-11 ENCOUNTER — Encounter: Payer: Self-pay | Admitting: *Deleted

## 2012-10-25 ENCOUNTER — Ambulatory Visit (INDEPENDENT_AMBULATORY_CARE_PROVIDER_SITE_OTHER): Payer: Medicare Other | Admitting: Cardiovascular Disease

## 2012-10-25 ENCOUNTER — Ambulatory Visit: Payer: Medicaid Other | Admitting: Cardiovascular Disease

## 2012-10-25 ENCOUNTER — Encounter: Payer: Self-pay | Admitting: Cardiovascular Disease

## 2012-10-25 VITALS — BP 134/64 | HR 63 | Ht 64.0 in | Wt 237.8 lb

## 2012-10-25 DIAGNOSIS — I2510 Atherosclerotic heart disease of native coronary artery without angina pectoris: Secondary | ICD-10-CM

## 2012-10-25 DIAGNOSIS — E782 Mixed hyperlipidemia: Secondary | ICD-10-CM

## 2012-10-25 DIAGNOSIS — R5381 Other malaise: Secondary | ICD-10-CM

## 2012-10-25 DIAGNOSIS — I4891 Unspecified atrial fibrillation: Secondary | ICD-10-CM

## 2012-10-25 DIAGNOSIS — E119 Type 2 diabetes mellitus without complications: Secondary | ICD-10-CM

## 2012-10-25 NOTE — Progress Notes (Signed)
Patient ID: [name], female   DOB: 11/23/1945, 67 y.o.   MRN: [id number]    HPI: [name] is a 43 -year-old female who I saw for initial cardiology evaluation on 09/29/1998 1420 courtesy of Dr. [name].  [name] admits to a history of diabetes mellitus since 2005. She underwent CABG revascularization surgery x2 with a maze procedure for atrial fibrillation at that time by Dr. Roxy Manns.  She experienced 2 episodes of palpitations where she felt her heart was temporarily out of rhythm. She denied associated chest tightness. She does note some shortness of breath. On further questioning, the patient does admit to poor sleep. She wakes up several times per night. She does snore loudly. Her sleep is nonrestorative.  She does note some mild leg swelling. Since her initial evaluation, she underwent a 2-D echo Doppler study on 10/05/2012. This showed normal systolic function with an ejection fraction of 55-60% appear she did have mild tissue Doppler abnormality. Her aortic valve was trileaflet and sclerotic with very mild stenosis with a mean gradient of 10 and a peak gradient of 20 mm respectively. There is trivial AR. She also had posterior calcified mitral annulus with mild MR. A nuclear perfusion study was done 9 years status post bypass surgery and this revealed mild breast attenuation but otherwise normal perfusion. Study is not gated to assess EF. She was in sinus rhythm but did have ectopy. When I last saw [name], I further titrate her Toprol to 75 mg and started her on losartan 50 mg daily. She states her blood pressure has been well-controlled. She is unaware of any significant palpitations.  She went for a diagnostic polysomnogram last week to evaluate for obstructive sleep apnea.  Past Medical History  Diagnosis Date  . Hypertension   . Hyperlipidemia   . Uterine cancer   . Coronary artery disease     Past Surgical History  Procedure Laterality Date  . Abdominal  hysterectomy    . Coronary artery bypass graft  2005  . Gallblader      No Known Allergies  Current Outpatient Prescriptions  Medication Sig Dispense Refill  . aspirin 325 MG EC tablet Take 325 mg by mouth daily.      . B Complex-Biotin-FA (B-COMPLEX PO) Take by mouth.      . Cholecalciferol (VITAMIN D3) 2000 UNITS TABS Take by mouth daily.      [name] co-enzyme Q-10 30 MG capsule Take 30 mg by mouth 3 (three) times daily.      . fenofibrate 160 MG tablet Take 1 tablet (160 mg total) by mouth daily.  30 tablet  6  . furosemide (LASIX) 40 MG tablet Take 40 mg by mouth 2 (two) times daily. Prn      . gabapentin (NEURONTIN) 100 MG capsule Take 100 mg by mouth daily. 1-2 tablets      . insulin aspart protamine- aspart (NOVOLOG MIX 70/30) (70-30) 100 UNIT/ML injection Inject into the skin 2 (two) times daily with a meal. 25 units      . losartan (COZAAR) 50 MG tablet Take 1 tablet (50 mg total) by mouth daily.  30 tablet  3  . metFORMIN (GLUCOPHAGE) 500 MG tablet Take 500 mg by mouth 2 (two) times daily with a meal. 2 tablets      . metoprolol succinate (TOPROL XL) 50 MG 24 hr tablet Take 1 & 1/2 tablet daily.  45 tablet  6  . omega-3 acid ethyl esters (LOVAZA) 1 G  capsule Take 2 capsules (2 g total) by mouth 2 (two) times daily.  120 capsule  6  . potassium chloride (K-DUR,KLOR-CON) 10 MEQ tablet Take 10 mEq by mouth daily.       No current facility-administered medications for this visit.    Socially she is married to [employer] She denied 47 years. Has 2 children one grandchild. She is retired. She completed ninth grade education. Has no history of tobacco use. She is not drink alcohol. She does walk approximately 2-3 times per week.  Family History  Problem Relation Age of Onset  . Hypertension Mother   . Hyperlipidemia Mother   . Heart disease Mother   . Diabetes Father   . Stroke Father   . Heart disease Father   . Hypertension Sister     3 sisters  . Arthritis Brother   .  Heart attack Maternal Grandfather   . Stroke Paternal Grandfather   . Hypertension Daughter     ROS is negative for fever chills or night sweats. She admits to long-standing obesity per she denies presyncope or syncope. She denies wheezing she denies recent chest tightness. She is status post CABG surgery 9 years ago. Her palpitations have improved with increased beta blocker therapy . She denies change in bowel or bladder habits. She does note some mild leg swelling. She denies paresthesias. Other system review is negative.  PE BP 134/64  Pulse 63  Ht _0  (1.626 m)  Wt 237 lb 12.8 oz (107.865 kg)  BMI 40.8 kg/m2 General: Alert, oriented, no distress. Normocephalic atraumatic.  Skin: normal turgor, no rashes HEENT: Normocephalic, atraumatic. Pupils round and reactive; sclera anicteric; bilateral xanthelasmas. Extraocular muscles are intact. Fundi mild arteriolar narrowing without hemorrhages or exudates. Nose without nasal septal hypertrophy Mouth/Parynx benign; Mallinpatti scale at least a 3. Neck: No JVD, no carotid briuts Lungs: clear to ausculatation and percussion; no wheezing or rales Heart: RRR, s1 s2 normal 1/6 systolic murmur aortic area. Abdomen: Moderate obesity;soft, nontender; no hepatosplenomehaly, BS+; abdominal aorta nontender and not dilated by palpation. Pulses 2+; no bruits. Extremities: Trivial edema bilaterally, no clubbinbg cyanosis, Homan's sign negative  Neurologic: grossly nonfocal Psychologic: Normal affect  ECG: Sinus rhythm at 69 beats per minute. QTc interval 445 ms.  LABS:  BMET    Component Value Date/Time   NA 138 05/26/2010 0348   K 4.0 HEMOLYZED SPECIMEN, RESULTS MAY BE AFFECTED 05/26/2010 0348   CL 101 05/26/2010 0348   CO2 28 05/26/2010 0348   GLUCOSE 131* 05/26/2010 0348   BUN 12 05/26/2010 0348   CREATININE 0.63 05/26/2010 0348   CALCIUM 9.2 05/26/2010 0348   GFRNONAA >60 05/26/2010 0348   GFRAA  Value: >60        The eGFR has been calculated  using the MDRD equation. This calculation has not been validated in all clinical situations. eGFR's persistently <60 mL/min signify possible Chronic Kidney Disease. 05/26/2010 0348     Hepatic Function Panel     Component Value Date/Time   PROT 6.3 05/25/2010 0450   ALBUMIN 3.3* 05/25/2010 0450   AST 43* 05/25/2010 0450   ALT 35 05/25/2010 0450   ALKPHOS 59 05/25/2010 0450   BILITOT 0.6 05/25/2010 0450     CBC    Component Value Date/Time   WBC 8.8 05/25/2010 0450   RBC 4.21 05/25/2010 0450   HGB 12.4 05/25/2010 0450   HCT 37.7 05/25/2010 0450   PLT 253 05/25/2010 0450   MCV 89.5 05/25/2010 0450  MCH 29.5 05/25/2010 0450   MCHC 32.9 05/25/2010 0450   RDW 14.8 05/25/2010 0450   LYMPHSABS 3.7 05/25/2010 0450   MONOABS 0.6 05/25/2010 0450   EOSABS 0.2 05/25/2010 0450   BASOSABS 0.0 05/25/2010 0450     BNP    Component Value Date/Time   PROBNP 51.0 05/24/2010 0013    Lipid Panel     Component Value Date/Time   CHOL  Value: 223        ATP III CLASSIFICATION:  <200     mg/dL   Desirable  200-239  mg/dL   Borderline High  >=240    mg/dL   High       * 05/24/2010 0535   TRIG 614* 05/24/2010 0535   HDL 42 05/24/2010 0535   CHOLHDL 5.3 05/24/2010 0535   VLDL UNABLE TO CALCULATE IF TRIGLYCERIDE OVER 400 mg/dL 05/24/2010 0535   LDLCALC  Value: UNABLE TO CALCULATE IF TRIGLYCERIDE OVER 400 mg/dL        Total Cholesterol/HDL:CHD Risk Coronary Heart Disease Risk Table                     Men   Women  1/2 Average Risk   3.4   3.3  Average Risk       5.0   4.4  2 X Average Risk   9.6   7.1  3 X Average Risk  23.4   11.0        Use the calculated Patient Ratio above and the CHD Risk Table to determine the patient's CHD Risk.        ATP III CLASSIFICATION (LDL):  <100     mg/dL   Optimal  100-129  mg/dL   Near or Above                    Optimal  130-159  mg/dL   Borderline  160-189  mg/dL   High  >190     mg/dL   Very High 05/24/2010 0535     RADIOLOGY: No results found.   ASSESSMENT AND PLAN:  My impression is  that Ms. Wignall is a 67 year old female who has at least a 9 year history of type 2 diabetes mellitus and is status post CBG surgery x2 by Dr. Roxy Manns in 2005. She did have a history of atrial fibrillation and underwent a Maze procedure at the time of her surgery. She had noticed some recurrent episodes of sensation that her heart rate has been out of rhythm. Her EKG one month ago and today continues to show sinus rhythm she did have PACs.  Her palpitations have improved with her increased Toprol dose to 75 mg daily. Her blood pressure today is significantly improved with the addition of losartan to medical regimen.  Laboratory by Dr. Melina Copa had shown marked hyperlipidemia with total cholesterol 228, HDL cholesterol 41. Triglycerides were 750. On NMR lipid profile she had an LDL small particle number of 1347 her LDL particle number was [id number]. She is tolerating the addition of fenofibrate 160 mg per medical regimen and increased fish oil. A recent nuclear perfusion study suggest continued graft patency. Her echo Doppler study shows normal systolic function with aortic sclerosis and very mild aortic stenosis as well as posterior mitral annular calcification. We did discuss the importance of weight loss. She also did have a sleep study done last week. We will need to obtain the results of this study. I will see her back in  the in 6 months for followup evaluation but she tells me Dr. Melina Copa will be rechecking laboratory.  Troy Sine, MD, [facility] 10/25/2012 11:17 AM

## 2012-10-25 NOTE — Patient Instructions (Signed)
Your physician recommends that you schedule a follow-up appointment in: 6 MONTHS. No changes has been made today.

## 2012-11-11 DIAGNOSIS — G4730 Sleep apnea, unspecified: Secondary | ICD-10-CM

## 2013-02-01 ENCOUNTER — Other Ambulatory Visit: Payer: Self-pay | Admitting: Cardiovascular Disease

## 2013-02-01 NOTE — Telephone Encounter (Signed)
Rx was sent to pharmacy electronically.

## 2013-08-09 ENCOUNTER — Other Ambulatory Visit: Payer: Self-pay | Admitting: Cardiovascular Disease

## 2013-08-09 NOTE — Telephone Encounter (Signed)
Rx was sent to pharmacy electronically.

## 2013-10-03 ENCOUNTER — Ambulatory Visit (INDEPENDENT_AMBULATORY_CARE_PROVIDER_SITE_OTHER): Payer: Medicare Other | Admitting: Cardiovascular Disease

## 2013-10-03 VITALS — BP 138/72 | HR 72 | Ht 64.0 in | Wt 239.0 lb

## 2013-10-03 DIAGNOSIS — I25119 Atherosclerotic heart disease of native coronary artery with unspecified angina pectoris: Secondary | ICD-10-CM

## 2013-10-03 DIAGNOSIS — I480 Paroxysmal atrial fibrillation: Secondary | ICD-10-CM

## 2013-10-03 DIAGNOSIS — I2510 Atherosclerotic heart disease of native coronary artery without angina pectoris: Secondary | ICD-10-CM

## 2013-10-03 DIAGNOSIS — I4891 Unspecified atrial fibrillation: Secondary | ICD-10-CM

## 2013-10-03 DIAGNOSIS — E782 Mixed hyperlipidemia: Secondary | ICD-10-CM

## 2013-10-03 DIAGNOSIS — I209 Angina pectoris, unspecified: Secondary | ICD-10-CM

## 2013-10-03 DIAGNOSIS — E119 Type 2 diabetes mellitus without complications: Secondary | ICD-10-CM

## 2013-10-03 NOTE — Patient Instructions (Addendum)
Your physician recommends that you schedule a follow-up appointment in: ONE YEAR with Dr. Claiborne Billings

## 2013-10-05 ENCOUNTER — Encounter: Payer: Self-pay | Admitting: Cardiovascular Disease

## 2013-10-05 NOTE — Progress Notes (Signed)
Patient ID: [name], female   DOB: 03-28-1945, 68 y.o.   MRN: [id number]    HPI: [name] is a 68 -year-old female who resides for one-year follow-up cardiology evaluation.  She is followed by Dr. Ellene Route for primary care.  [name] admits to a history of diabetes mellitus since 2005. She underwent CABG revascularization surgery x2 with a maze procedure for atrial fibrillation at that time by Dr. Roxy Manns.  She experienced 2 episodes of palpitations where she felt her heart was temporarily out of rhythm. She denied associated chest tightness. She does note some shortness of breath. . She underwent a 2-D echo Doppler study on 10/05/2012 which showed normal systolic function with an ejection fraction of 55-60% with mild tissue Doppler abnormality. Her aortic valve was trileaflet and sclerotic with very mild stenosis with a mean gradient of 10 and a peak gradient of 20 mm respectively. There is trivial AR. She also had posterior calcified mitral annulus with mild MR. A nuclear perfusion study was done 9 years status post bypass surgery and this revealed mild breast attenuation but otherwise normal perfusion. Study is not gated to assess EF. She was in sinus rhythm but did have ectopy. Last year, I further titrate her Toprol to 75 mg and started her on losartan 50 mg daily. She states her blood pressure has been well-controlled. She is unaware of any significant palpitations.   In March.  She underwent blood work by Dr. Melina Copa, which showed a glucose of 219.  Her cholesterol was 223, HDL 46, LDL 100.  She denies chest pain.  She does have peripheral neuropathy, for which he takes gabapentin.  She has been on losartan, 50 mg and Toprol-XL 75 mg for blood pressure.  She has been taking fenofibrate 160 mg daily.  Past Medical History  Diagnosis Date  . Hypertension   . Hyperlipidemia   . Uterine cancer   . Coronary artery disease     Past Surgical History  Procedure Laterality Date   . Abdominal hysterectomy    . Coronary artery bypass graft  2005  . Gallblader      No Known Allergies  Current Outpatient Prescriptions  Medication Sig Dispense Refill  . aspirin 325 MG EC tablet Take 325 mg by mouth daily.      . B Complex-Biotin-FA (B-COMPLEX PO) Take by mouth.      . Cholecalciferol (VITAMIN D3) 2000 UNITS TABS Take 4,000 Units by mouth daily.       [name] co-enzyme Q-10 30 MG capsule Take 30 mg by mouth 3 (three) times daily.      . fenofibrate 160 MG tablet Take 1 tablet (160 mg total) by mouth daily.  30 tablet  6  . furosemide (LASIX) 40 MG tablet Take 40 mg by mouth daily. 1-2 daily as needed.      . gabapentin (NEURONTIN) 100 MG capsule Take 300 mg by mouth at bedtime.       . insulin aspart protamine- aspart (NOVOLOG MIX 70/30) (70-30) 100 UNIT/ML injection Inject into the skin 2 (two) times daily with a meal. 25 units      . losartan (COZAAR) 50 MG tablet TAKE 1 TABLET DAILY  30 tablet  3  . metFORMIN (GLUCOPHAGE) 500 MG tablet Take 500 mg by mouth 2 (two) times daily with a meal. 2 tablets      . metoprolol succinate (TOPROL XL) 50 MG 24 hr tablet Take 1 & 1/2 tablet daily.  45 tablet  6  . omega-3 acid ethyl esters (LOVAZA) 1 G capsule Take 2 capsules (2 g total) by mouth 2 (two) times daily.  120 capsule  6  . potassium chloride (K-DUR,KLOR-CON) 10 MEQ tablet Take 10 mEq by mouth daily.       No current facility-administered medications for this visit.    Socially she is married to [employer] She denied 47 years. Has 2 children one grandchild. She is retired. She completed ninth grade education. Has no history of tobacco use. She is not drink alcohol. She does walk approximately 2-3 times per week.  Family History  Problem Relation Age of Onset  . Hypertension Mother   . Hyperlipidemia Mother   . Heart disease Mother   . Diabetes Father   . Stroke Father   . Heart disease Father   . Hypertension Sister     3 sisters  . Arthritis Brother   .  Heart attack Maternal Grandfather   . Stroke Paternal Grandfather   . Hypertension Daughter    ROS General: Negative; No fevers, chills, or night sweats;  HEENT: Negative; No changes in vision or hearing, sinus congestion, difficulty swallowing Pulmonary: Negative; No cough, wheezing, shortness of breath, hemoptysis Cardiovascular: Negative; No chest pain, presyncope, syncope, palpitations GI: Negative; No nausea, vomiting, diarrhea, or abdominal pain GU: Negative; No dysuria, hematuria, or difficulty voiding Musculoskeletal: Negative; no myalgias, joint pain, or weakness Hematologic/Oncology: Negative; no easy bruising, bleeding Endocrine: Negative; no heat/cold intolerance; no diabetes Neuro: Negative; no changes in balance, headaches Skin: Negative; No rashes or skin lesions Psychiatric: Negative; No behavioral problems, depression Sleep: Positive for snoring, no daytime sleepiness, hypersomnolence, bruxism, restless legs, hypnogognic hallucinations, no cataplexy Other comprehensive 14 point system review is negative.   PE BP 138/72  Pulse 72  Ht _0  (1.626 m)  Wt 239 lb (108.41 kg)  BMI 41.00 kg/m2 General: Alert, oriented, no distress. Normocephalic atraumatic.  Skin: normal turgor, no rashes HEENT: Normocephalic, atraumatic. Pupils round and reactive; sclera anicteric; bilateral xanthelasmas. Extraocular muscles are intact. Fundi mild arteriolar narrowing without hemorrhages or exudates. Nose without nasal septal hypertrophy Mouth/Parynx benign; Mallinpatti scale at least a 3. Neck: No JVD, no carotid bruits with normal carotid upstroke Lungs: clear to ausculatation and percussion; no wheezing or rales Heart: RRR, s1 s2 normal 1/6 systolic murmur aortic area.;  No diastolic murmur Abdomen: Moderate obesity;soft, nontender; no hepatosplenomehaly, BS+; abdominal aorta nontender and not dilated by palpation. Back: No CVA tenderness Pulses 2+; no bruits. Extremities: Trivial  edema bilaterally, no clubbinbg cyanosis, Homan's sign negative  Neurologic: grossly nonfocal Psychologic: Normal affect  ECG (independently read by me): Sinus rhythm with short PR 88 ms.  Nonspecific T changes.  Incomplete right bundle branch block  10/2013 ECG: Sinus rhythm at 69 beats per minute. QTc interval 445 ms.  LABS:  BMET    Component Value Date/Time   NA 138 05/26/2010 0348   K 4.0 HEMOLYZED SPECIMEN, RESULTS MAY BE AFFECTED 05/26/2010 0348   CL 101 05/26/2010 0348   CO2 28 05/26/2010 0348   GLUCOSE 131* 05/26/2010 0348   BUN 12 05/26/2010 0348   CREATININE 0.63 05/26/2010 0348   CALCIUM 9.2 05/26/2010 0348   GFRNONAA >60 05/26/2010 0348   GFRAA  Value: >60        The eGFR has been calculated using the MDRD equation. This calculation has not been validated in all clinical situations. eGFR's persistently <60 mL/min signify possible Chronic Kidney Disease. 05/26/2010 5035  Hepatic Function Panel     Component Value Date/Time   PROT 6.3 05/25/2010 0450   ALBUMIN 3.3* 05/25/2010 0450   AST 43* 05/25/2010 0450   ALT 35 05/25/2010 0450   ALKPHOS 59 05/25/2010 0450   BILITOT 0.6 05/25/2010 0450     CBC    Component Value Date/Time   WBC 8.8 05/25/2010 0450   RBC 4.21 05/25/2010 0450   HGB 12.4 05/25/2010 0450   HCT 37.7 05/25/2010 0450   PLT 253 05/25/2010 0450   MCV 89.5 05/25/2010 0450   MCH 29.5 05/25/2010 0450   MCHC 32.9 05/25/2010 0450   RDW 14.8 05/25/2010 0450   LYMPHSABS 3.7 05/25/2010 0450   MONOABS 0.6 05/25/2010 0450   EOSABS 0.2 05/25/2010 0450   BASOSABS 0.0 05/25/2010 0450     BNP    Component Value Date/Time   PROBNP 51.0 05/24/2010 0013    Lipid Panel     Component Value Date/Time   CHOL  Value: 223        ATP III CLASSIFICATION:  <200     mg/dL   Desirable  200-239  mg/dL   Borderline High  >=240    mg/dL   High       * 05/24/2010 0535   TRIG 614* 05/24/2010 0535   HDL 42 05/24/2010 0535   CHOLHDL 5.3 05/24/2010 0535   VLDL UNABLE TO CALCULATE IF TRIGLYCERIDE  OVER 400 mg/dL 05/24/2010 0535   LDLCALC  Value: UNABLE TO CALCULATE IF TRIGLYCERIDE OVER 400 mg/dL        Total Cholesterol/HDL:CHD Risk Coronary Heart Disease Risk Table                     Men   Women  1/2 Average Risk   3.4   3.3  Average Risk       5.0   4.4  2 X Average Risk   9.6   7.1  3 X Average Risk  23.4   11.0        Use the calculated Patient Ratio above and the CHD Risk Table to determine the patient's CHD Risk.        ATP III CLASSIFICATION (LDL):  <100     mg/dL   Optimal  100-129  mg/dL   Near or Above                    Optimal  130-159  mg/dL   Borderline  160-189  mg/dL   High  >190     mg/dL   Very High 05/24/2010 0535     RADIOLOGY: No results found.   ASSESSMENT AND PLAN:    Ms. Kueker is a 68 year old female who has at least a 10 year history of type 2 diabetes mellitus and is status post CBG surgery x2 by Dr. Roxy Manns in 2005. She has  a history of atrial fibrillation and underwent a Maze procedure at the time of her surgery.  She is diabetic and recent blood sugar was elevated.  She currently is on metformin 1000 mg twice a day.  Her most recent lipid studies showing cholesterol elevated at 223.  She tells me repeat blood work is to be done by Dr. Melina Copa.  Her blood pressure today is well controlled on her current therapy.  She's not having any further awareness of palpitations or recurrent atrial fibrillation.  She is taking aspirin and I suggested she can reduce this to 81 mg.  We discussed  the importance of weight loss and increased exercise.  She is previously noted very mild aortic sclerosis/stenosis.  I will see her in one year for followup evaluation or sooner if problems arise.    Troy Sine, MD, [facility] 10/05/2013 9:06 PM

## 2013-10-14 ENCOUNTER — Other Ambulatory Visit: Payer: Self-pay | Admitting: Cardiovascular Disease

## 2013-10-14 NOTE — Telephone Encounter (Signed)
Rx was sent to pharmacy electronically.

## 2013-11-28 ENCOUNTER — Other Ambulatory Visit: Payer: Self-pay | Admitting: Cardiovascular Disease

## 2013-12-10 ENCOUNTER — Other Ambulatory Visit: Payer: Self-pay | Admitting: Cardiovascular Disease

## 2013-12-12 NOTE — Telephone Encounter (Signed)
Rx was sent to pharmacy electronically.

## 2014-06-04 ENCOUNTER — Inpatient Hospital Stay (HOSPITAL_COMMUNITY)
Admission: EM | Admit: 2014-06-04 | Discharge: 2014-06-06 | DRG: 243 | Disposition: A | Discharge status: Home / Self Care | Payer: Medicare Other | Attending: Cardiovascular Disease | Admitting: Cardiovascular Disease

## 2014-06-04 ENCOUNTER — Encounter (HOSPITAL_COMMUNITY): Payer: Self-pay | Admitting: *Deleted

## 2014-06-04 DIAGNOSIS — I110 Hypertensive heart disease with heart failure: Secondary | ICD-10-CM | POA: Insufficient documentation

## 2014-06-04 DIAGNOSIS — R002 Palpitations: Secondary | ICD-10-CM | POA: Diagnosis present

## 2014-06-04 DIAGNOSIS — E8770 Fluid overload, unspecified: Secondary | ICD-10-CM | POA: Diagnosis present

## 2014-06-04 DIAGNOSIS — E1142 Type 2 diabetes mellitus with diabetic polyneuropathy: Secondary | ICD-10-CM | POA: Diagnosis present

## 2014-06-04 DIAGNOSIS — Z8249 Family history of ischemic heart disease and other diseases of the circulatory system: Secondary | ICD-10-CM

## 2014-06-04 DIAGNOSIS — Z823 Family history of stroke: Secondary | ICD-10-CM | POA: Diagnosis not present

## 2014-06-04 DIAGNOSIS — E669 Obesity, unspecified: Secondary | ICD-10-CM | POA: Diagnosis present

## 2014-06-04 DIAGNOSIS — I4891 Unspecified atrial fibrillation: Secondary | ICD-10-CM | POA: Diagnosis not present

## 2014-06-04 DIAGNOSIS — Z794 Long term (current) use of insulin: Secondary | ICD-10-CM

## 2014-06-04 DIAGNOSIS — I480 Paroxysmal atrial fibrillation: Secondary | ICD-10-CM | POA: Diagnosis present

## 2014-06-04 DIAGNOSIS — Z8673 Personal history of transient ischemic attack (TIA), and cerebral infarction without residual deficits: Secondary | ICD-10-CM

## 2014-06-04 DIAGNOSIS — I495 Sick sinus syndrome: Secondary | ICD-10-CM | POA: Diagnosis present

## 2014-06-04 DIAGNOSIS — Z951 Presence of aortocoronary bypass graft: Secondary | ICD-10-CM | POA: Diagnosis not present

## 2014-06-04 DIAGNOSIS — I119 Hypertensive heart disease without heart failure: Secondary | ICD-10-CM | POA: Diagnosis present

## 2014-06-04 DIAGNOSIS — E782 Mixed hyperlipidemia: Secondary | ICD-10-CM | POA: Diagnosis present

## 2014-06-04 DIAGNOSIS — Z950 Presence of cardiac pacemaker: Secondary | ICD-10-CM

## 2014-06-04 DIAGNOSIS — Z6841 Body Mass Index (BMI) 40.0 and over, adult: Secondary | ICD-10-CM

## 2014-06-04 DIAGNOSIS — Z8542 Personal history of malignant neoplasm of other parts of uterus: Secondary | ICD-10-CM

## 2014-06-04 DIAGNOSIS — Z833 Family history of diabetes mellitus: Secondary | ICD-10-CM | POA: Diagnosis not present

## 2014-06-04 DIAGNOSIS — I10 Essential (primary) hypertension: Secondary | ICD-10-CM

## 2014-06-04 DIAGNOSIS — Z7982 Long term (current) use of aspirin: Secondary | ICD-10-CM

## 2014-06-04 DIAGNOSIS — I2510 Atherosclerotic heart disease of native coronary artery without angina pectoris: Secondary | ICD-10-CM | POA: Diagnosis present

## 2014-06-04 HISTORY — DX: Paroxysmal atrial fibrillation: I48.0

## 2014-06-04 HISTORY — DX: Atherosclerotic heart disease of native coronary artery without angina pectoris: I25.10

## 2014-06-04 HISTORY — DX: Mixed hyperlipidemia: E78.2

## 2014-06-04 HISTORY — DX: Hypertensive heart disease without heart failure: I11.9

## 2014-06-04 LAB — URINALYSIS, ROUTINE W REFLEX MICROSCOPIC
BILIRUBIN URINE: NEGATIVE
Glucose, UA: >1000 mg/dL — AB
Hgb urine dipstick: NEGATIVE
KETONES UR: 15 mg/dL — AB
LEUKOCYTES UA: NEGATIVE
Nitrite: NEGATIVE
Protein, ur: NEGATIVE mg/dL
Specific Gravity, Urine: 1.027 (ref 1.005–1.030)
Urobilinogen, UA: 1 mg/dL (ref 0.0–1.0)
pH: 7 (ref 5.0–8.0)

## 2014-06-04 LAB — CBC WITH DIFFERENTIAL/PLATELET
BASOS PCT: 1 % (ref 0–1)
Basophils Absolute: 0.1 10*3/uL (ref 0.0–0.1)
EOS PCT: 1 % (ref 0–5)
Eosinophils Absolute: 0.1 10*3/uL (ref 0.0–0.7)
HEMATOCRIT: 40.1 % (ref 36.0–46.0)
HEMOGLOBIN: 13.3 g/dL (ref 12.0–15.0)
LYMPHS ABS: 3.5 10*3/uL (ref 0.7–4.0)
Lymphocytes Relative: 34 % (ref 12–46)
MCH: 30.4 pg (ref 26.0–34.0)
MCHC: 33.2 g/dL (ref 30.0–36.0)
MCV: 91.8 fL (ref 78.0–100.0)
MONOS PCT: 5 % (ref 3–12)
Monocytes Absolute: 0.5 10*3/uL (ref 0.1–1.0)
NEUTROS ABS: 6 10*3/uL (ref 1.7–7.7)
Neutrophils Relative %: 59 % (ref 43–77)
Platelets: 299 10*3/uL (ref 150–400)
RBC: 4.37 MIL/uL (ref 3.87–5.11)
RDW: 13.7 % (ref 11.5–15.5)
WBC: 10.2 10*3/uL (ref 4.0–10.5)

## 2014-06-04 LAB — I-STAT CHEM 8, ED
BUN: 16 mg/dL (ref 6–23)
CREATININE: 0.6 mg/dL (ref 0.50–1.10)
Calcium, Ion: 1.15 mmol/L (ref 1.13–1.30)
Chloride: 99 mmol/L (ref 96–112)
Glucose, Bld: 264 mg/dL — ABNORMAL HIGH (ref 70–99)
HCT: 41 % (ref 36.0–46.0)
HEMOGLOBIN: 13.9 g/dL (ref 12.0–15.0)
POTASSIUM: 4.1 mmol/L (ref 3.5–5.1)
Sodium: 137 mmol/L (ref 135–145)
TCO2: 24 mmol/L (ref 0–100)

## 2014-06-04 LAB — HEPATIC FUNCTION PANEL
ALK PHOS: 65 U/L (ref 39–117)
ALT: 31 U/L (ref 0–35)
AST: 36 U/L (ref 0–37)
Albumin: 3.9 g/dL (ref 3.5–5.2)
Bilirubin, Direct: 0.2 mg/dL (ref 0.0–0.5)
Indirect Bilirubin: 0.3 mg/dL (ref 0.3–0.9)
Total Bilirubin: 0.5 mg/dL (ref 0.3–1.2)
Total Protein: 6.9 g/dL (ref 6.0–8.3)

## 2014-06-04 LAB — I-STAT TROPONIN, ED: Troponin i, poc: 0 ng/mL (ref 0.00–0.08)

## 2014-06-04 LAB — URINE MICROSCOPIC-ADD ON

## 2014-06-04 LAB — CBG MONITORING, ED: Glucose-Capillary: 183 mg/dL — ABNORMAL HIGH (ref 70–99)

## 2014-06-04 LAB — GLUCOSE, CAPILLARY: GLUCOSE-CAPILLARY: 275 mg/dL — AB (ref 70–99)

## 2014-06-04 LAB — TROPONIN I: Troponin I: <0.03 ng/mL (ref <0.031)

## 2014-06-04 MED ORDER — DILTIAZEM HCL 100 MG IV SOLR
5.0000 mg/h | INTRAVENOUS | Status: DC
  Filled 2014-06-04: qty 100

## 2014-06-04 MED ORDER — INSULIN ASPART 100 UNIT/ML ~~LOC~~ SOLN
0.0000 [IU] | Freq: Three times a day (TID) | SUBCUTANEOUS | Status: DC
  Administered 2014-06-05: 5 [IU] via SUBCUTANEOUS
  Administered 2014-06-05: 3 [IU] via SUBCUTANEOUS
  Administered 2014-06-05: 8 [IU] via SUBCUTANEOUS
  Administered 2014-06-06 (×2): 3 [IU] via SUBCUTANEOUS

## 2014-06-04 MED ORDER — FLECAINIDE ACETATE 100 MG PO TABS
300.0000 mg | ORAL_TABLET | Freq: Once | ORAL | Status: DC
  Filled 2014-06-04: qty 3

## 2014-06-04 MED ORDER — LIDOCAINE-EPINEPHRINE (PF) 2 %-1:200000 IJ SOLN: 20.0000 mL | Freq: Once | INTRAMUSCULAR | Status: DC

## 2014-06-04 MED ORDER — DILTIAZEM LOAD VIA INFUSION
15.0000 mg | Freq: Once | INTRAVENOUS | Status: AC
  Administered 2014-06-04: 15 mg via INTRAVENOUS
  Filled 2014-06-04: qty 15

## 2014-06-04 MED ORDER — GABAPENTIN 300 MG PO CAPS
300.0000 mg | ORAL_CAPSULE | Freq: Every day | ORAL | Status: DC
  Administered 2014-06-05 (×2): 300 mg via ORAL
  Filled 2014-06-04 (×3): qty 1

## 2014-06-04 MED ORDER — ONDANSETRON HCL 4 MG/2ML IJ SOLN
4.0000 mg | Freq: Four times a day (QID) | INTRAMUSCULAR | Status: DC | PRN
  Administered 2014-06-05: 4 mg via INTRAVENOUS
  Filled 2014-06-04: qty 2

## 2014-06-04 MED ORDER — POTASSIUM CHLORIDE CRYS ER 10 MEQ PO TBCR
10.0000 meq | EXTENDED_RELEASE_TABLET | Freq: Every day | ORAL | Status: DC
  Administered 2014-06-05 – 2014-06-06 (×2): 10 meq via ORAL
  Filled 2014-06-04 (×2): qty 1

## 2014-06-04 MED ORDER — DILTIAZEM HCL 100 MG IV SOLR
5.0000 mg/h | INTRAVENOUS | Status: DC
  Administered 2014-06-04: 15 mg/h via INTRAVENOUS
  Administered 2014-06-04: 5 mg/h via INTRAVENOUS

## 2014-06-04 MED ORDER — SODIUM CHLORIDE 0.9 % IV SOLN
INTRAVENOUS | Status: DC
  Administered 2014-06-04: 12:00:00 via INTRAVENOUS
  Administered 2014-06-05: 125 mL/h via INTRAVENOUS

## 2014-06-04 MED ORDER — SODIUM CHLORIDE 0.9 % IV BOLUS (SEPSIS)
500.0000 mL | Freq: Once | INTRAVENOUS | Status: AC
  Administered 2014-06-04: 500 mL via INTRAVENOUS

## 2014-06-04 MED ORDER — INSULIN ASPART PROT & ASPART (70-30 MIX) 100 UNIT/ML ~~LOC~~ SUSP
25.0000 [IU] | Freq: Two times a day (BID) | SUBCUTANEOUS | Status: DC
  Administered 2014-06-05 – 2014-06-06 (×2): 25 [IU] via SUBCUTANEOUS
  Filled 2014-06-04: qty 10

## 2014-06-04 MED ORDER — METFORMIN HCL 500 MG PO TABS
500.0000 mg | ORAL_TABLET | Freq: Two times a day (BID) | ORAL | Status: DC
Start: 2014-06-05 — End: 2014-06-06
  Administered 2014-06-05 – 2014-06-06 (×2): 500 mg via ORAL
  Filled 2014-06-04 (×5): qty 1

## 2014-06-04 MED ORDER — LOSARTAN POTASSIUM 50 MG PO TABS
50.0000 mg | ORAL_TABLET | Freq: Every day | ORAL | Status: DC
  Administered 2014-06-05 – 2014-06-06 (×2): 50 mg via ORAL
  Filled 2014-06-04 (×2): qty 1

## 2014-06-04 MED ORDER — FENOFIBRATE 160 MG PO TABS
160.0000 mg | ORAL_TABLET | Freq: Every day | ORAL | Status: DC
  Administered 2014-06-05 – 2014-06-06 (×2): 160 mg via ORAL
  Filled 2014-06-04 (×2): qty 1

## 2014-06-04 MED ORDER — METOPROLOL TARTRATE 50 MG PO TABS
50.0000 mg | ORAL_TABLET | Freq: Two times a day (BID) | ORAL | Status: DC
  Administered 2014-06-04: 50 mg via ORAL
  Filled 2014-06-04 (×2): qty 1
  Filled 2014-06-04: qty 2

## 2014-06-04 MED ORDER — ACETAMINOPHEN 325 MG PO TABS
650.0000 mg | ORAL_TABLET | ORAL | Status: DC | PRN
  Administered 2014-06-05 – 2014-06-06 (×3): 650 mg via ORAL
  Filled 2014-06-04 (×5): qty 2

## 2014-06-04 MED ORDER — METOPROLOL TARTRATE 1 MG/ML IV SOLN
5.0000 mg | Freq: Once | INTRAVENOUS | Status: AC
  Administered 2014-06-04: 5 mg via INTRAVENOUS
  Filled 2014-06-04: qty 5

## 2014-06-04 NOTE — Progress Notes (Signed)
ANTICOAGULATION CONSULT NOTE - Initial Consult  Pharmacy Consult for Heparin  Indication: atrial fibrillation, new onset  No Known Allergies  Patient Measurements: Height: _0  (162.6 cm) Weight: 242 lb 1 oz (109.8 kg) IBW/kg (Calculated) : 54.7  Vital Signs: Temp: 98.5 F (36.9 C) (03/20 2032) Temp Source: Oral (03/20 2032) BP: 143/81 mmHg (03/20 2032) Pulse Rate: 149 (03/20 2032)  Labs:  Recent Labs  06/04/14 1040 06/04/14 1103 06/04/14 2055  HGB 13.3 13.9  --   HCT 40.1 41.0  --   PLT 299  --   --   CREATININE  --  0.60  --   TROPONINI  --   --  <0.03    Estimated Creatinine Clearance: 81.5 mL/min (by C-G formula based on Cr of 0.6).   Medical History: Past Medical History  Diagnosis Date  . Hypertension   . Hyperlipidemia   . Uterine cancer     age 69 with partial hysterectomy  . Coronary artery disease   . 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 Paroxysmal atrial fibrillation     Maze procedure done at surgery in 2005  Recurrence in March 2016 CHADS2VASC score  5      . Mixed hyperlipidemia   . Coronary atherosclerosis of native coronary artery     Coronary artery bypass grafting by Dr. Roxy Manns in 20052, Maze procedure done at that time   . Hypertensive heart disease     Assessment: Heparin for new onset afib CBC/Renal function good  Goal of Therapy:  Heparin level 0.3-0.7 units/ml Monitor platelets by anticoagulation protocol: Yes   Plan -Heparin 4000 units BOLUS -Start heparin drip at 1100 units/hr -0800 HL -Daily CBC/HL -Monitor for bleeding  Narda Bonds 06/05/2014,12:00 AM

## 2014-06-04 NOTE — ED Notes (Signed)
CBG: 183, Tray ordered per MD

## 2014-06-04 NOTE — ED Notes (Signed)
Dinner tray ordered, carb modified, heart healthy

## 2014-06-04 NOTE — H&P (Addendum)
History and Physical   Admit date: 06/04/2014 Name:  [name] Medical record number: [id number] DOB/Age:  68-Feb-1947  69 y.o. female  Referring Physician:   [facility]  Primary Cardiologist:  Dr. Shelva Majestic  Primary Physician:   Dr. [name]  Chief complaint/reason for admission: Rapid heart rate  HPI:  This 69 year old female has a history of diabetes mellitus with peripheral neuropathy, coronary artery disease with previous bypass grafting in 2005 with a Maze procedure being done at the same time, hypertension and hyperlipidemia as well as obesity.  She has been getting along fairly well except will have intermittent palpitations and rapid heartbeat that may improve if she rests.  Her last episode was a few weeks ago.  She has been under situational stress recently but had been feeling well otherwise without shortness of breath or chest pain.  She awoke last evening with rapid heart rate around 2:30 in the morning but did not tell her family.  She checked her blood pressure and found to be quite elevated at 190/120 and had some left-sided chest discomfort.  She waited until her husband woke up the next morning and because of rapid heart rate, feeling weak and left-sided neck discomfort was brought to [facility] emergency room.  She was started on treatment with intravenous diltiazem but her rate remains up.  She was giving a dose of Lopressor 5 mg IV but still remains elevated.  Complains of mild shortness of breath.  She is admitted to the hospital at this time for treatment of rapid atrial fibrillation.  She denies PND, orthopnea, syncope, or claudication.   Diagnosis  . Hypertension  . Hyperlipidemia  . Uterine cancer  .  type 2 diabetes with peripheral neuropathy    . 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 Paroxysmal atrial fibrillation  . Mixed hyperlipidemia  . Coronary atherosclerosis of  native coronary artery  . Hypertensive heart disease       Obesity   Past Surgical History  Procedure Laterality Date  . Abdominal hysterectomy    . Coronary artery bypass graft  2005  . Gallblader     Allergies: has No Known Allergies.   Medications: Prior to Admission medications   Medication Sig Start Date End Date Taking? Authorizing Provider  aspirin 325 MG EC tablet Take 325 mg by mouth daily.   Yes Historical Provider, MD  B Complex-Biotin-FA (B-COMPLEX PO) Take by mouth.   Yes Historical Provider, MD  Cholecalciferol (VITAMIN D3) 2000 UNITS TABS Take 4,000 Units by mouth daily.    Yes Historical Provider, MD  co-enzyme Q-10 30 MG capsule Take 30 mg by mouth 3 (three) times daily.   Yes Historical Provider, MD  fenofibrate 160 MG tablet TAKE 1 TABLET DAILY 11/28/13  Yes Troy Sine, MD  furosemide (LASIX) 40 MG tablet Take 40 mg by mouth daily. 1-2 daily as needed.   Yes Historical Provider, MD  gabapentin (NEURONTIN) 100 MG capsule Take 300 mg by mouth at bedtime.    Yes Historical Provider, MD  insulin aspart protamine- aspart (NOVOLOG MIX 70/30) (70-30) 100 UNIT/ML injection Inject into the skin 2 (two) times daily with a meal. 25 units   Yes Historical Provider, MD  metFORMIN (GLUCOPHAGE) 500 MG tablet Take 500 mg by mouth 2 (two) times daily with a meal. 2 tablets   Yes Historical Provider, MD  metoprolol succinate (TOPROL-XL) 50 MG 24 hr tablet Take 1.5  tablets (40m) by mouth once daily 10/14/13  Yes TTroy Sine MD  potassium chloride (K-DUR,KLOR-CON) 10 MEQ tablet Take 10 mEq by mouth daily.   Yes Historical Provider, MD  losartan (COZAAR) 50 MG tablet TAKE 1 TABLET DAILY Patient not taking: Reported on 06/04/2014 12/12/13   TTroy Sine MD  omega-3 acid ethyl esters (LOVAZA) 1 G capsule Take 2 capsules (2 g total) by mouth 2 (two) times daily. Patient not taking: Reported on 06/04/2014 09/28/12   TTroy Sine MD   Family History:  Family Status  Relation Status  Death Age  . Mother Alive   . Father Deceased   . Sister Alive   . Brother Alive   . Maternal Grandfather Deceased   . Paternal Grandfather Deceased   . Daughter Alive     Social History:   reports that she has never smoked. She has never used smokeless tobacco. She reports that she does not drink alcohol.   History   Social History Narrative    Review of Systems: She has been obese for many years.  No eye problems, she has significant painful peripheral neuropathy and has been on gabapentin.  Mild dyspnea and occasional edema that she will take furosemide 4. Other than as noted above, the remainder of the review of systems is normal  Physical Exam: BP 115/82 mmHg  Pulse 135  Temp(Src) 97.9 F (36.6 C) (Oral)  Resp 24  SpO2 96% General appearance: Pleasant obese white female in no acute distress Head: Normocephalic, without obvious abnormality, atraumatic Eyes: conjunctivae/corneas clear. PERRL, EOM's intact. Fundi not examined  Neck: no adenopathy, no carotid bruit, no JVD and supple, symmetrical, trachea midline Lungs: clear to auscultation bilaterally Heart: Rapid irregular rhythm, normal S1-S2 no S3 Abdomen: soft, non-tender; bowel sounds normal; no masses,  no organomegaly and Quite large Pelvic: deferred Extremities: 1+ peripheral edema Pulses: 2+ and symmetric Skin: Skin color, texture, turgor normal. No rashes or lesions Neurologic: Grossly normal  Labs: CBC  Recent Labs  06/04/14 1040 06/04/14 1103  WBC 10.2  --   RBC 4.37  --   HGB 13.3 13.9  HCT 40.1 41.0  PLT 299  --   MCV 91.8  --   MCH 30.4  --   MCHC 33.2  --   RDW 13.7  --   LYMPHSABS 3.5  --   MONOABS 0.5  --   EOSABS 0.1  --   BASOSABS 0.1  --    CMP   Recent Labs  06/04/14 1040 06/04/14 1103  NA  --  137  K  --  4.1  CL  --  99  GLUCOSE  --  264*  BUN  --  16  CREATININE  --  0.60  PROT 6.9  --   ALBUMIN 3.9  --   AST 36  --   ALT 31  --   ALKPHOS 65  --   BILITOT 0.5   --    BNP (last 3 results)  Cardiac Panel (last 3 results) Troponin (Point of Care Test)  Recent Labs  06/04/14 1102  TROPIPOC 0.00   EKG: Atrial fibrillation with rapid ventricular response, nonspecific ST changes  Radiology: No x-ray available for review at time of admission   IMPRESSIONS: 1.  Paroxysmal atrial fibrillation with rapid ventricular response, CHADS2VASC score is 7 2.  Coronary artery disease is previous bypass grafting 10 years ago 3.  Hypertensive heart disease 4.  Type 2 diabetes with peripheral neuropathy 5.  Hyperlipidemia 6.  History of TIA and stroke 7.  Obesity  PLAN: Her heart rate is still rapid despite intravenous diltiazem.  She'll be continued on a diltiazem drip and we will increase her beta blockers.  The duration of onset is less than 24 hours and will keep nothing by mouth for possible cardioversion tomorrow.  Check echocardiogram in the morning.  She will need to be anticoagulated systemically and will defer agent of choice to primary team in order to allow cardioversion or possible TEE cardioversion tomorrow.  Obtain chest x-ray, cycle enzymes and in the meantime begin on intravenous heparin.  Change to short-acting beta blockers so that we can titrate dose up higher.  Signed: Kerry Hough MD [facility]  06/04/2014, 6:28 PM

## 2014-06-04 NOTE — ED Notes (Signed)
Pt stated that her heart started racing about 2:30am. She has also had left arm and back pain for a couple of months.

## 2014-06-04 NOTE — ED Provider Notes (Signed)
CSN: [id number]     Arrival date & time 06/04/14  0957 History   First MD Initiated Contact with Patient 06/04/14 1006     Chief Complaint  Patient presents with  . Atrial Fibrillation     (Consider location/radiation/quality/duration/timing/severity/associated sxs/prior Treatment) HPI   [name] is a 69 y.o. female who presents for evaluation of palpitations, which started today. No preceding chest pain, weakness or dizziness. She has had intermittent pain in her left arm and back for several months. She's been eating well. She denies changes in bowel or urinary habits. There are no other known modifying factors.   Past Medical History  Diagnosis Date  . Hypertension   . Hyperlipidemia   . Uterine cancer   . Coronary artery disease    Past Surgical History  Procedure Laterality Date  . Abdominal hysterectomy    . Coronary artery bypass graft  2005  . Gallblader     Family History  Problem Relation Age of Onset  . Hypertension Mother   . Hyperlipidemia Mother   . Heart disease Mother   . Diabetes Father   . Stroke Father   . Heart disease Father   . Hypertension Sister     3 sisters  . Arthritis Brother   . Heart attack Maternal Grandfather   . Stroke Paternal Grandfather   . Hypertension Daughter    History  Substance Use Topics  . Smoking status: Never Smoker   . Smokeless tobacco: Never Used  . Alcohol Use: No   OB History    No data available     Review of Systems    Allergies  Review of patient's allergies indicates no known allergies.  Home Medications   Prior to Admission medications   Medication Sig Start Date End Date Taking? Authorizing Provider  aspirin 325 MG EC tablet Take 325 mg by mouth daily.   Yes Historical Provider, MD  B Complex-Biotin-FA (B-COMPLEX PO) Take by mouth.   Yes Historical Provider, MD  Cholecalciferol (VITAMIN D3) 2000 UNITS TABS Take 4,000 Units by mouth daily.    Yes Historical Provider, MD  co-enzyme Q-10  30 MG capsule Take 30 mg by mouth 3 (three) times daily.   Yes Historical Provider, MD  fenofibrate 160 MG tablet TAKE 1 TABLET DAILY 11/28/13  Yes Troy Sine, MD  furosemide (LASIX) 40 MG tablet Take 40 mg by mouth daily. 1-2 daily as needed.   Yes Historical Provider, MD  gabapentin (NEURONTIN) 100 MG capsule Take 300 mg by mouth at bedtime.    Yes Historical Provider, MD  insulin aspart protamine- aspart (NOVOLOG MIX 70/30) (70-30) 100 UNIT/ML injection Inject into the skin 2 (two) times daily with a meal. 25 units   Yes Historical Provider, MD  metFORMIN (GLUCOPHAGE) 500 MG tablet Take 500 mg by mouth 2 (two) times daily with a meal. 2 tablets   Yes Historical Provider, MD  metoprolol succinate (TOPROL-XL) 50 MG 24 hr tablet Take 1.5 tablets (67m) by mouth once daily 10/14/13  Yes TTroy Sine MD  potassium chloride (K-DUR,KLOR-CON) 10 MEQ tablet Take 10 mEq by mouth daily.   Yes Historical Provider, MD  losartan (COZAAR) 50 MG tablet TAKE 1 TABLET DAILY Patient not taking: Reported on 06/04/2014 12/12/13   TTroy Sine MD  omega-3 acid ethyl esters (LOVAZA) 1 G capsule Take 2 capsules (2 g total) by mouth 2 (two) times daily. Patient not taking: Reported on 06/04/2014 09/28/12   TTroy Sine MD  BP 124/69 mmHg  Pulse 145  Temp(Src) 97.9 F (36.6 C) (Oral)  Resp 22  SpO2 96% Physical Exam  ED Course  Procedures (including critical care time)  Initial evaluation consistent with recurrent, new onset, atrial fibrillation  CHA2DS2VASc Score- 4 ( DM,  HTN )-  4.8% Risk CVA   Medications  diltiazem (CARDIZEM) 1 mg/mL load via infusion 15 mg (15 mg Intravenous New Bag/Given 06/04/14 1434)    And  diltiazem (CARDIZEM) 100 mg in dextrose 5 % 100 mL (1 mg/mL) infusion (15 mg/hr Intravenous Rate/Dose Change 06/04/14 1611)  0.9 %  sodium chloride infusion ( Intravenous New Bag/Given 06/04/14 1152)  flecainide (TAMBOCOR) tablet 300 mg (not administered)  sodium chloride 0.9 % bolus  500 mL (0 mLs Intravenous Stopped 06/04/14 1151)  metoprolol (LOPRESSOR) injection 5 mg (5 mg Intravenous Given 06/04/14 1109)  sodium chloride 0.9 % bolus 500 mL (0 mLs Intravenous Stopped 06/04/14 1437)    Patient Vitals for the past 24 hrs:  BP Temp Temp src Pulse Resp SpO2  06/04/14 1630 124/69 mmHg - - (!) 145 22 96 %  06/04/14 1615 123/74 mmHg - - (!) 145 21 97 %  06/04/14 1600 118/94 mmHg - - (!) 147 20 96 %  06/04/14 1545 111/75 mmHg - - (!) 145 18 96 %  06/04/14 1530 138/85 mmHg - - (!) 145 19 96 %  06/04/14 1515 126/80 mmHg - - (!) 140 20 95 %  06/04/14 1500 149/91 mmHg - - (!) 144 16 97 %  06/04/14 1445 128/81 mmHg - - (!) 145 18 96 %  06/04/14 1430 148/90 mmHg - - (!) 143 20 96 %  06/04/14 1415 129/87 mmHg - - (!) 143 19 97 %  06/04/14 1400 - - - (!) 146 26 97 %  06/04/14 1345 155/96 mmHg - - (!) 137 21 96 %  06/04/14 1330 146/91 mmHg - - (!) 142 20 97 %  06/04/14 1315 134/95 mmHg - - (!) 137 15 96 %  06/04/14 1300 145/99 mmHg - - (!) 140 23 97 %  06/04/14 1245 (!) 151/104 mmHg - - (!) 141 22 96 %  06/04/14 1230 149/100 mmHg - - (!) 141 22 97 %  06/04/14 1215 153/98 mmHg - - (!) 140 20 97 %  06/04/14 1200 (!) 153/105 mmHg - - (!) 139 20 97 %  06/04/14 1130 143/99 mmHg - - (!) 137 21 97 %  06/04/14 1115 138/95 mmHg - - (!) 142 19 96 %  06/04/14 1100 (!) 153/102 mmHg - - (!) 143 18 97 %  06/04/14 1045 143/93 mmHg - - (!) 148 17 97 %  06/04/14 1015 (!) 120/108 mmHg - - - - -  06/04/14 1003 147/94 mmHg 97.9 F (36.6 C) Oral (!) 144 21 96 %    11:47 AM Reevaluation with update and discussion. After initial assessment and treatment, an updated evaluation reveals no significant improvement with IV dose of Lopressor, patient remains tachycardic, but fairly comfortable. Family updated. Telissa Palmisano L   12:00- case discussed with cardiology, Dr. Radford Pax, who recommends continuing on Cardizem treatment, to control heart rate. If the heart rate can be controlled with Cardizem, she  suggests admission by hospitalist. At the heart rate cannot be controlled with Cardizem. She will treat the patient with amiodarone, and admit her.  17:15- case discussed with cardiology, Dr. Wynonia Lawman will see the patient in the emergency department.   CRITICAL CARE Performed by: Richarda Blade Total critical care time:  45 minutes Critical care time was exclusive of separately billable procedures and treating other patients. Critical care was necessary to treat or prevent imminent or life-threatening deterioration. Critical care was time spent personally by me on the following activities: development of treatment plan with patient and/or surrogate as well as nursing, discussions with consultants, evaluation of patient's response to treatment, examination of patient, obtaining history from patient or surrogate, ordering and performing treatments and interventions, ordering and review of laboratory studies, ordering and review of radiographic studies, pulse oximetry and re-evaluation of patient's condition.  Labs Review Labs Reviewed  URINALYSIS, ROUTINE W REFLEX MICROSCOPIC - Abnormal; Notable for the following:    Glucose, UA >1000 (*)    Ketones, ur 15 (*)    All other components within normal limits  URINE MICROSCOPIC-ADD ON - Abnormal; Notable for the following:    Squamous Epithelial / LPF FEW (*)    Crystals CA OXALATE CRYSTALS (*)    All other components within normal limits  I-STAT CHEM 8, ED - Abnormal; Notable for the following:    Glucose, Bld 264 (*)    All other components within normal limits  CBG MONITORING, ED - Abnormal; Notable for the following:    Glucose-Capillary 183 (*)    All other components within normal limits  URINE CULTURE  CBC WITH DIFFERENTIAL/PLATELET  HEPATIC FUNCTION PANEL  I-STAT TROPOININ, ED    Imaging Review No results found.   EKG Interpretation   Date/Time:  Sunday June 04 2014 17:30:37 EDT Ventricular Rate:  138 PR Interval:    QRS  Duration: 95 QT Interval:  287 QTC Calculation: 435 R Axis:   75 Text Interpretation:  Atrial fibrillation Low voltage, precordial leads  Borderline repolarization abnormality Since last tracing of earlier today  essentially unchanged (rate slightly slower) Confirmed by Eulis Foster  MD,  Dyanne Yorks ([id number]) on 06/04/2014 5:36:07 PM         MDM   Final diagnoses:  Atrial fibrillation, new onset  Atrial fibrillation with RVR      Atrial fibrillation, with rapid ventricular response, new onset, and no clear cause. Doubt ACS, PE or pneumonia.  Nursing Notes Reviewed/ Care Coordinated, and agree without changes. Applicable Imaging Reviewed.  Interpretation of Laboratory Data incorporated into ED treatment  Plan: Admit    Daleen Bo, MD 06/04/14 [phone]

## 2014-06-04 NOTE — ED Notes (Signed)
Attempted to draw blood unable to do so. RN made aware.

## 2014-06-05 ENCOUNTER — Inpatient Hospital Stay (HOSPITAL_COMMUNITY): Payer: Medicare Other

## 2014-06-05 ENCOUNTER — Encounter (HOSPITAL_COMMUNITY)
Admission: EM | Disposition: A | Discharge status: Home / Self Care | Payer: Self-pay | Source: Home / Self Care | Attending: Cardiovascular Disease

## 2014-06-05 DIAGNOSIS — I4891 Unspecified atrial fibrillation: Secondary | ICD-10-CM | POA: Insufficient documentation

## 2014-06-05 DIAGNOSIS — I480 Paroxysmal atrial fibrillation: Secondary | ICD-10-CM

## 2014-06-05 DIAGNOSIS — I495 Sick sinus syndrome: Secondary | ICD-10-CM | POA: Diagnosis not present

## 2014-06-05 HISTORY — PX: PERMANENT PACEMAKER INSERTION: SHX5480

## 2014-06-05 LAB — CBC
HCT: 36.5 % (ref 36.0–46.0)
Hemoglobin: 11.8 g/dL — ABNORMAL LOW (ref 12.0–15.0)
MCH: 29.9 pg (ref 26.0–34.0)
MCHC: 32.3 g/dL (ref 30.0–36.0)
MCV: 92.4 fL (ref 78.0–100.0)
Platelets: 252 10*3/uL (ref 150–400)
RBC: 3.95 MIL/uL (ref 3.87–5.11)
RDW: 14 % (ref 11.5–15.5)
WBC: 9.5 10*3/uL (ref 4.0–10.5)

## 2014-06-05 LAB — URINE CULTURE
COLONY COUNT: NO GROWTH
Culture: NO GROWTH
Special Requests: NORMAL

## 2014-06-05 LAB — HEPARIN LEVEL (UNFRACTIONATED)

## 2014-06-05 LAB — BASIC METABOLIC PANEL
ANION GAP: 7 (ref 5–15)
BUN: 9 mg/dL (ref 6–23)
CALCIUM: 8 mg/dL — AB (ref 8.4–10.5)
CHLORIDE: 106 mmol/L (ref 96–112)
CO2: 25 mmol/L (ref 19–32)
Creatinine, Ser: 0.76 mg/dL (ref 0.50–1.10)
GFR calc non Af Amer: 85 mL/min — ABNORMAL LOW (ref >90)
Glucose, Bld: 266 mg/dL — ABNORMAL HIGH (ref 70–99)
Potassium: 3.8 mmol/L (ref 3.5–5.1)
SODIUM: 138 mmol/L (ref 135–145)

## 2014-06-05 LAB — GLUCOSE, CAPILLARY
GLUCOSE-CAPILLARY: 244 mg/dL — AB (ref 70–99)
GLUCOSE-CAPILLARY: 152 mg/dL — AB (ref 70–99)
Glucose-Capillary: 261 mg/dL — ABNORMAL HIGH (ref 70–99)
Glucose-Capillary: 172 mg/dL — ABNORMAL HIGH (ref 70–99)

## 2014-06-05 LAB — BRAIN NATRIURETIC PEPTIDE: B Natriuretic Peptide: 236.3 pg/mL — ABNORMAL HIGH (ref 0.0–100.0)

## 2014-06-05 LAB — TSH: TSH: 2.84 u[IU]/mL (ref 0.350–4.500)

## 2014-06-05 LAB — TROPONIN I
Troponin I: <0.03 ng/mL (ref <0.031)
Troponin I: <0.03 ng/mL (ref <0.031)

## 2014-06-05 SURGERY — PERMANENT PACEMAKER INSERTION

## 2014-06-05 MED ORDER — MIDAZOLAM HCL 5 MG/5ML IJ SOLN
INTRAMUSCULAR | Status: AC
  Filled 2014-06-05: qty 5

## 2014-06-05 MED ORDER — HEPARIN (PORCINE) IN NACL 100-0.45 UNIT/ML-% IJ SOLN
1350.0000 [IU]/h | INTRAMUSCULAR | Status: DC
  Administered 2014-06-05: 1100 [IU]/h via INTRAVENOUS
  Filled 2014-06-05 (×2): qty 250

## 2014-06-05 MED ORDER — HEPARIN BOLUS VIA INFUSION
4000.0000 [IU] | Freq: Once | INTRAVENOUS | Status: AC
  Administered 2014-06-05: 4000 [IU] via INTRAVENOUS
  Filled 2014-06-05: qty 4000

## 2014-06-05 MED ORDER — SODIUM CHLORIDE 0.9 % IV SOLN: INTRAVENOUS | Status: DC

## 2014-06-05 MED ORDER — HEPARIN BOLUS VIA INFUSION
2000.0000 [IU] | Freq: Once | INTRAVENOUS | Status: AC
Start: 2014-06-05 — End: 2014-06-05
  Administered 2014-06-05: 2000 [IU] via INTRAVENOUS
  Filled 2014-06-05: qty 2000

## 2014-06-05 MED ORDER — FUROSEMIDE 10 MG/ML IJ SOLN
20.0000 mg | Freq: Once | INTRAMUSCULAR | Status: AC
  Administered 2014-06-05: 20 mg via INTRAVENOUS
  Filled 2014-06-05: qty 2

## 2014-06-05 MED ORDER — CHLORHEXIDINE GLUCONATE 4 % EX LIQD
60.0000 mL | Freq: Once | CUTANEOUS | Status: AC
  Filled 2014-06-05: qty 60

## 2014-06-05 MED ORDER — FENTANYL CITRATE 0.05 MG/ML IJ SOLN
INTRAMUSCULAR | Status: AC
  Filled 2014-06-05: qty 2

## 2014-06-05 MED ORDER — GENTAMICIN SULFATE 40 MG/ML IJ SOLN
80.0000 mg | INTRAMUSCULAR | Status: DC
  Filled 2014-06-05: qty 2

## 2014-06-05 MED ORDER — METOPROLOL TARTRATE 25 MG PO TABS
25.0000 mg | ORAL_TABLET | Freq: Two times a day (BID) | ORAL | Status: DC
  Administered 2014-06-05 – 2014-06-06 (×3): 25 mg via ORAL
  Filled 2014-06-05 (×4): qty 1

## 2014-06-05 MED ORDER — CEFAZOLIN SODIUM-DEXTROSE 2-3 GM-% IV SOLR
INTRAVENOUS | Status: AC
  Filled 2014-06-05: qty 50

## 2014-06-05 MED ORDER — CHLORHEXIDINE GLUCONATE 4 % EX LIQD
60.0000 mL | Freq: Once | CUTANEOUS | Status: AC
  Administered 2014-06-05: 4 via TOPICAL
  Filled 2014-06-05: qty 60

## 2014-06-05 MED ORDER — CEFAZOLIN SODIUM-DEXTROSE 2-3 GM-% IV SOLR
2.0000 g | INTRAVENOUS | Status: DC
  Filled 2014-06-05: qty 50

## 2014-06-05 NOTE — Progress Notes (Signed)
Pt c/o dizzy, diaphoretic. Heartrate dropped to low 40s with 7 second pause. Has converted back to NSR/SB. Cardizem gtt stopped upon RN arrival to room. Patient heartrate slowly rising to low 60. MD Wynonia Lawman notified and agreed with stop of Cardizem. BP, O2 sats stable. Pt states feels better at this time.Will continue to monitor closely.

## 2014-06-05 NOTE — CV Procedure (Signed)
SURGEON:  Cristopher Peru, MD     PREPROCEDURE DIAGNOSIS:  Symptomatic Bradycardia due to sinus node dysfunction    POSTPROCEDURE DIAGNOSIS:  Same as preprocedure diagnosis     PROCEDURES:   1. Pacemaker implantation.     INTRODUCTION: [name] is a 69 y.o. female  with a history of bradycardia who presents today for pacemaker implantation.  The patient reports intermittent episodes of dizziness over the past few months.  No reversible causes have been identified.  The patient therefore presents today for pacemaker implantation.     DESCRIPTION OF PROCEDURE:  Informed written consent was obtained, and   the patient was brought to the electrophysiology lab in a fasting state.  The patient required no sedation for the procedure today.  The patients left chest was prepped and draped in the usual sterile fashion by the EP lab staff. The skin overlying the left deltopectoral region was infiltrated with lidocaine for local analgesia.  A 4-cm incision was made over the left deltopectoral region.  A left subcutaneous pacemaker pocket was fashioned using a combination of sharp and blunt dissection. Electrocautery was required to assure hemostasis.    RA/RV Lead Placement: The left axillary vein was therefore directly visualized and cannulated.  Through the left axillary vein, a medtronic (serial number [id number]) right atrial lead and a Medtronic (serial number [id number]) right ventricular lead were advanced with fluoroscopic visualization into the right atrial appendage and right ventricular apical septal positions respectively.  Initial atrial lead P- waves measured 1.4 mV with impedance of 631 ohms and a threshold of 1.2 V at 0.5 msec.  Right ventricular lead R-waves measured 11 mV with an impedance of 800 ohms and a threshold of 1 V at 0.5 msec.  Both leads were secured to the pectoralis fascia using #2-0 silk over the suture sleeves.   Device Placement:  The leads were then connected to a  Medtronic MRI compatible (serial number [id number]) pacemaker.  The pocket was irrigated with copious gentamicin solution.  The pacemaker was then placed into the pocket.  The pocket was then closed in 2 layers with 2.0 Vicryl suture for the subcutaneous and subcuticular layers.  Steri-Strips and a sterile dressing were then applied.  There were no early apparent complications.     CONCLUSIONS:   1. Successful implantation of a Medtronic dual-chamber pacemaker for symptomatic bradycardia due to sinus node dysfunction with long post termination pauses.  2. No early apparent complications.           Cristopher Peru, MD 06/05/2014 5:39 PM

## 2014-06-05 NOTE — Progress Notes (Signed)
ANTICOAGULATION CONSULT NOTE - Initial Consult  Pharmacy Consult for heparin  Indication: atrial fibrillation  No Known Allergies  Patient Measurements: Height: 5' 4" (162.6 cm) Weight: 242 lb 1 oz (109.8 kg) IBW/kg (Calculated) : 54.7 Heparin Dosing Weight: 80 kg  Vital Signs: Temp: 98 F (36.7 C) (03/21 0439) Temp Source: Oral (03/21 0439) BP: 140/61 mmHg (03/21 0439) Pulse Rate: 59 (03/21 0439)  Labs:  Recent Labs  06/04/14 1040 06/04/14 1103 06/04/14 2055 06/05/14 0040 06/05/14 0645 06/05/14 0832  HGB 13.3 13.9  --   --  11.8*  --   HCT 40.1 41.0  --   --  36.5  --   PLT 299  --   --   --  252  --   HEPARINUNFRC  --   --   --   --   --  <0.10*  CREATININE  --  0.60  --   --  0.76  --   TROPONINI  --   --  <0.03 <0.03 <0.03  --     Estimated Creatinine Clearance: 81.5 mL/min (by C-G formula based on Cr of 0.76).   Medical History: Past Medical History  Diagnosis Date  . Hypertension   . Hyperlipidemia   . Uterine cancer     age 73 with partial hysterectomy  . Coronary artery disease   . 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 Paroxysmal atrial fibrillation     Maze procedure done at surgery in 2005  Recurrence in March 2016 CHADS2VASC score  5      . Mixed hyperlipidemia   . Coronary atherosclerosis of native coronary artery     Coronary artery bypass grafting by Dr. Roxy Manns in [address], Maze procedure done at that time   . Hypertensive heart disease     Medications:  See EMR  Assessment: 69 yo female with new onset AFib. Pt was taking aspirin 325 mg po PTA, no other anticoagulation. Heparin level was undetectable this morning. CBC stable and wnl.  Goal of Therapy:  Heparin level 0.3-0.7 units/ml Monitor platelets by anticoagulation protocol: Yes   Plan:  -Heparin bolus 2000 units -Increase heparin to 1350 units/hr -Check level this afternoon -Daily HL, CBC -F/u transition to PO  anticoag   Hughes Better, PharmD, BCPS Clinical Pharmacist Pager: [phone] 06/05/2014 10:11 AM

## 2014-06-05 NOTE — Progress Notes (Signed)
Utilization review completed

## 2014-06-05 NOTE — Progress Notes (Signed)
Patient Name: [name] Date of Encounter: 06/05/2014  Active Problems:   Paroxysmal atrial fibrillation   History of uterine cancer   Primary Cardiologist: Dr. Claiborne Billings  Patient Profile: 69 yo female w/ hx CABG 2005, DM, HTN, HL, obesity, was admitted 03/20 w/ palpitations, atrial fib, RVR.  SUBJECTIVE: Felt like she was going to pass out at 3 am when she converted  OBJECTIVE Filed Vitals:   06/04/14 1915 06/04/14 1928 06/04/14 2032 06/05/14 0439  BP: 127/89 127/89 143/81 140/61  Pulse: 122 104 149 59  Temp:   98.5 F (36.9 C) 98 F (36.7 C)  TempSrc:   Oral Oral  Resp: 25   18  Height:  _0  (1.626 m)    Weight:  242 lb 1 oz (109.8 kg)    SpO2: 97%  96% 96%    Intake/Output Summary (Last 24 hours) at 06/05/14 3734 Last data filed at 06/05/14 0345  Gross per 24 hour  Intake      0 ml  Output      5 ml  Net     -5 ml   Filed Weights   06/04/14 1928  Weight: 242 lb 1 oz (109.8 kg)    PHYSICAL EXAM General: Well developed, well nourished, female in no acute distress. Head: Normocephalic, atraumatic.  Neck: Supple without bruits, JVD slightly elevated, difficult to assess 2nd body habitus. Lungs:  Resp regular and unlabored, decreased BS bases, few rales, occ upper airway wheeze. Heart: RRR, S1, S2, no S3, S4, soft murmur; no rub. Abdomen: Soft, non-tender, non-distended, BS + x 4.  Extremities: No clubbing, cyanosis, trace edema.  Neuro: Alert and oriented X 3. Moves all extremities spontaneously. Psych: Normal affect.  LABS: CBC: Recent Labs  06/04/14 1040 06/04/14 1103 06/05/14 0645  WBC 10.2  --  9.5  NEUTROABS 6.0  --   --   HGB 13.3 13.9 11.8*  HCT 40.1 41.0 36.5  MCV 91.8  --  92.4  PLT 299  --  287   Basic Metabolic Panel: Recent Labs  06/04/14 1103 06/05/14 0645  NA 137 138  K 4.1 3.8  CL 99 106  CO2  --  25  GLUCOSE 264* 266*  BUN 16 9  CREATININE 0.60 0.76  CALCIUM  --  8.0*   Liver Function Tests: Recent Labs  06/04/14 1040  AST 36  ALT 31  ALKPHOS 65  BILITOT 0.5  PROT 6.9  ALBUMIN 3.9   Cardiac Enzymes: Recent Labs  06/04/14 2055 06/05/14 0040 06/05/14 0645  TROPONINI <0.03 <0.03 <0.03    Recent Labs  06/04/14 1102  TROPIPOC 0.00   BNP:  B NATRIURETIC PEPTIDE  Date/Time Value Ref Range Status  06/05/2014 12:40 AM 236.3* 0.0 - 100.0 pg/mL Final   Thyroid Function Tests: Recent Labs  06/05/14 0040  TSH 2.840    TELE:  Rapid afib>>sinus brady w/ 7.76 sec post-termination pause      ECG: sinus brady, 58, lateral T wave changes from 2015  Radiology/Studies: Portable Chest X-ray 1 View 06/05/2014   CLINICAL DATA:  Shortness of breath and atrial fibrillation  EXAM: PORTABLE CHEST - 1 VIEW  COMPARISON:  May 23, 2010 chest radiograph and May 23, 2010 chest CT  FINDINGS: There is cardiomegaly with mild pulmonary venous hypertension. There is mild generalized interstitial edema. There is no airspace consolidation. No adenopathy. Patient is status post coronary artery bypass grafting.  IMPRESSION: Findings consistent with a degree of congestive heart failure.  No airspace consolidation.   Electronically Signed   By: Lowella Grip III M.D.   On: 06/05/2014 07:17   Current Medications:  . fenofibrate  160 mg Oral Daily  . gabapentin  300 mg Oral QHS  . insulin aspart  0-15 Units Subcutaneous TID WC  . insulin aspart protamine- aspart  25 Units Subcutaneous BID WC  . losartan  50 mg Oral Daily  . metFORMIN  500 mg Oral BID WC  . metoprolol tartrate  50 mg Oral BID  . potassium chloride  10 mEq Oral Daily   . sodium chloride 125 mL/hr (06/05/14 0406)  . diltiazem (CARDIZEM) infusion Stopped (06/05/14 0305)  . heparin 1,100 Units/hr (06/05/14 0141)    ASSESSMENT AND PLAN: Active Problems:   Paroxysmal atrial fibrillation - post termination pause 7.76 sec, see tele strips - cardizem d/c'd - on metoprolol 50 mg bid, home dose was Toprol XL 75 mg qd - will decrease  metoprolol to 25 mg bid    History of uterine cancer - follow    Volume overload - Takes Lasix 40 mg prn, will give 20 mg IV x 1  Plan - ambulate and see how tolerated, will decrease BB dose to 25 mg bid, have texted GT about pause to see if he thinks PPM needed.  D/C when medically stable.  Signed, Rosaria Ferries , PA-C 8:33 AM 06/05/2014  Patient seen, examined. Available data reviewed. Agree with findings, assessment, and plan as outlined by Rosaria Ferries, PA-C. Exam reveals a pleasant, obese woman in no distress. Lungs are clear. Heart is regular rate and rhythm without murmur or gallop. There is trace pretibial edema.  I have personally reviewed hospital notes and telemetry. The patient had a 7 second posttermination pause this morning. This was followed by a period of marked junctional bradycardia. She was highly symptomatic with this with near syncope and decreased cognition per nursing staff. I will obtain an electrophysiology consultation for consideration of permanent pacemaker placement. IV diltiazem was appropriately discontinued. Suspect she would have ongoing risk of similar episodes in the setting of paroxysmal atrial fibrillation.  Sherren Mocha, M.D. 06/05/2014 11:16 AM

## 2014-06-05 NOTE — H&P (Signed)
Admit date: 06/04/2014 Name: [name] Medical record number: [id number] DOB/Age: 68-Mar-1947 68 y.o. female  Referring Physician:Dr. Burt Knack  Primary Cardiologist: Dr. Shelva Majestic  Primary Physician: Dr. [name]  Chief complaint/reason for admission: Rapid heart rate  HPI:  This 69 year old female has a history of diabetes mellitus with peripheral neuropathy, coronary artery disease with previous bypass grafting in 2005 with a Maze procedure being done at the same time, hypertension and hyperlipidemia as well as obesity. She has been getting along fairly well except will have intermittent palpitations and rapid heartbeat that may improve if she rests. Her last episode was a few weeks ago. She has been under situational stress recently but had been feeling well otherwise without shortness of breath or chest pain. She awoke last evening with rapid heart rate around 2:30 in the morning but did not tell her family. She checked her blood pressure and found to be quite elevated at 190/120 and had some left-sided chest discomfort. She waited until her husband woke up the next morning and because of rapid heart rate, feeling weak and left-sided neck discomfort was brought to [facility] emergency room. She was started on treatment with intravenous diltiazem but her rate remains up. She was giving a dose of Lopressor 5 mg IV but still remains elevated. Complains of mild shortness of breath. She is admitted to the hospital at this time for treatment of rapid atrial fibrillation. She denies PND, orthopnea, syncope, or claudication. Since admit, the patient has had recurrent pauses out of atrial fib and back to NSR, the longest of which was 8 seconds. The patient has been on low dose cardizem but also has atrial fib with an RVR.  Diagnosis  . Hypertension  . Hyperlipidemia  . Uterine cancer  . type 2 diabetes with peripheral neuropathy    . 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 Paroxysmal atrial fibrillation  . Mixed hyperlipidemia  . Coronary atherosclerosis of native coronary artery  . Hypertensive heart disease   Obesity  Past Surgical History  Procedure Laterality Date  . Abdominal hysterectomy    . Coronary artery bypass graft  2005  . Gallblader     Allergies: has No Known Allergies.  Medications: Prior to Admission medications   Medication Sig Start Date End Date Taking? Authorizing Provider  aspirin 325 MG EC tablet Take 325 mg by mouth daily.   Yes Historical Provider, MD  B Complex-Biotin-FA (B-COMPLEX PO) Take by mouth.   Yes Historical Provider, MD  Cholecalciferol (VITAMIN D3) 2000 UNITS TABS Take 4,000 Units by mouth daily.    Yes Historical Provider, MD  co-enzyme Q-10 30 MG capsule Take 30 mg by mouth 3 (three) times daily.   Yes Historical Provider, MD  fenofibrate 160 MG tablet TAKE 1 TABLET DAILY 11/28/13  Yes Troy Sine, MD  furosemide (LASIX) 40 MG tablet Take 40 mg by mouth daily. 1-2 daily as needed.   Yes Historical Provider, MD  gabapentin (NEURONTIN) 100 MG capsule Take 300 mg by mouth at bedtime.    Yes Historical Provider, MD  insulin aspart protamine- aspart (NOVOLOG MIX 70/30) (70-30) 100 UNIT/ML injection Inject into the skin 2 (two) times daily with a meal. 25 units   Yes Historical Provider, MD  metFORMIN (GLUCOPHAGE) 500 MG tablet Take 500 mg by mouth 2 (two) times daily with a meal. 2 tablets   Yes Historical Provider, MD  metoprolol succinate (TOPROL-XL) 50 MG 24  hr tablet Take 1.5 tablets (39m) by mouth once daily 10/14/13  Yes TTroy Sine MD  potassium chloride (K-DUR,KLOR-CON) 10 MEQ tablet Take 10 mEq by mouth daily.   Yes Historical Provider, MD  losartan (COZAAR) 50 MG tablet TAKE 1 TABLET DAILY Patient not taking: Reported  on 06/04/2014 12/12/13   TTroy Sine MD  omega-3 acid ethyl esters (LOVAZA) 1 G capsule Take 2 capsules (2 g total) by mouth 2 (two) times daily. Patient not taking: Reported on 06/04/2014 09/28/12   TTroy Sine MD   Family History:  Family Status  Relation Status Death Age  . Mother Alive   . Father Deceased   . Sister Alive   . Brother Alive   . Maternal Grandfather Deceased   . Paternal Grandfather Deceased   . Daughter Alive     Social History:  reports that she has never smoked. She has never used smokeless tobacco. She reports that she does not drink alcohol.  History   Social History Narrative   Review of Systems: She has been obese for many years. No eye problems, she has significant painful peripheral neuropathy and has been on gabapentin. Mild dyspnea and occasional edema that she will take furosemide 4. Other than as noted above, the remainder of the review of systems is normal  Physical Exam: BP 115/82 mmHg  Pulse 135  Temp(Src) 97.9 F (36.6 C) (Oral)  Resp 24  SpO2 96% General appearance: Pleasant obese white female in no acute distress Head: Normocephalic, without obvious abnormality, atraumatic Eyes: conjunctivae/corneas clear. PERRL, EOM's intact. Fundi not examined  Neck: no adenopathy, no carotid bruit, no JVD and supple, symmetrical, trachea midline Lungs: clear to auscultation bilaterally Heart: Rapid irregular rhythm, normal S1-S2 no S3 Abdomen: soft, non-tender; bowel sounds normal; no masses, no organomegaly and Quite large Pelvic: deferred Extremities: 1+ peripheral edema Pulses: 2+ and symmetric Skin: Skin color, texture, turgor normal. No rashes or lesions Neurologic: Grossly normal  Labs: CBC  Recent Labs (last 2 labs)      Recent Labs  06/04/14 1040 06/04/14 1103  WBC 10.2 --   RBC 4.37 --   HGB 13.3 13.9  HCT 40.1 41.0  PLT 299 --   MCV 91.8 --    MCH 30.4 --   MCHC 33.2 --   RDW 13.7 --   LYMPHSABS 3.5 --   MONOABS 0.5 --   EOSABS 0.1 --   BASOSABS 0.1 --      CMP  Recent Labs (last 2 labs)      Recent Labs  06/04/14 1040 06/04/14 1103  NA --  137  K --  4.1  CL --  99  GLUCOSE --  264*  BUN --  16  CREATININE --  0.60  PROT 6.9 --   ALBUMIN 3.9 --   AST 36 --   ALT 31 --   ALKPHOS 65 --   BILITOT 0.5 --      BNP (last 3 results)  Cardiac Panel (last 3 results) Troponin (Point of Care Test)  Recent Labs (last 2 labs)      Recent Labs  06/04/14 1102  TROPIPOC 0.00     EKG: Atrial fibrillation with rapid ventricular response, nonspecific ST changes  Radiology: No x-ray available for review at time of admission  IMPRESSIONS: 1. Paroxysmal atrial fibrillation with rapid ventricular response, CHADS2VASC score is 7, with long post termination  Pauses out of atrial fib. 2. Coronary artery disease is previous bypass grafting  10 years ago 3. Hypertensive heart disease 4. Type 2 diabetes with peripheral neuropathy 5. Hyperlipidemia 6. History of TIA and stroke 7. Obesity  PLAN: Her heart rate was rapid despite intravenous diltiazem and she has had a long post termination pause.  She'll be continued on a diltiazem drip and we will increase her beta blockers. She will need a ppm and adjustment of her AV nodal blocking drugs.       Cristopher Peru, M.D.

## 2014-06-06 ENCOUNTER — Inpatient Hospital Stay (HOSPITAL_COMMUNITY): Payer: Medicare Other

## 2014-06-06 ENCOUNTER — Encounter (HOSPITAL_COMMUNITY): Payer: Self-pay | Admitting: Internal Medicine

## 2014-06-06 LAB — CBC
HEMATOCRIT: 38.1 % (ref 36.0–46.0)
Hemoglobin: 12.3 g/dL (ref 12.0–15.0)
MCH: 29.5 pg (ref 26.0–34.0)
MCHC: 32.3 g/dL (ref 30.0–36.0)
MCV: 91.4 fL (ref 78.0–100.0)
Platelets: 272 10*3/uL (ref 150–400)
RBC: 4.17 MIL/uL (ref 3.87–5.11)
RDW: 14.1 % (ref 11.5–15.5)
WBC: 8.3 10*3/uL (ref 4.0–10.5)

## 2014-06-06 LAB — GLUCOSE, CAPILLARY
Glucose-Capillary: 175 mg/dL — ABNORMAL HIGH (ref 70–99)
Glucose-Capillary: 155 mg/dL — ABNORMAL HIGH (ref 70–99)

## 2014-06-06 LAB — HEMOGLOBIN A1C
HEMOGLOBIN A1C: 9.4 % — AB (ref 4.8–5.6)
Mean Plasma Glucose: 223 mg/dL

## 2014-06-06 MED ORDER — ASPIRIN EC 81 MG PO TBEC: 81.0000 mg | DELAYED_RELEASE_TABLET | Freq: Every day | ORAL | Status: DC

## 2014-06-06 MED ORDER — APIXABAN 5 MG PO TABS: 5.0000 mg | ORAL_TABLET | Freq: Two times a day (BID) | ORAL | Status: DC

## 2014-06-06 MED ORDER — DILTIAZEM HCL ER COATED BEADS 120 MG PO CP24
120.0000 mg | ORAL_CAPSULE | Freq: Every day | ORAL | Status: DC
  Administered 2014-06-06: 120 mg via ORAL
  Filled 2014-06-06: qty 1

## 2014-06-06 MED ORDER — OFF THE BEAT BOOK
Freq: Once | Status: AC
  Administered 2014-06-06: 1
  Filled 2014-06-06: qty 1

## 2014-06-06 MED ORDER — VANCOMYCIN HCL 10 G IV SOLR
1500.0000 mg | Freq: Once | INTRAVENOUS | Status: AC
  Administered 2014-06-06: 1500 mg via INTRAVENOUS
  Filled 2014-06-06: qty 1500

## 2014-06-06 MED ORDER — VANCOMYCIN HCL IN DEXTROSE 1-5 GM/200ML-% IV SOLN: 1000.0000 mg | Freq: Once | INTRAVENOUS | Status: DC

## 2014-06-06 MED ORDER — DILTIAZEM HCL ER COATED BEADS 120 MG PO CP24: 120.0000 mg | ORAL_CAPSULE | Freq: Every day | ORAL | Status: DC

## 2014-06-06 MED ORDER — VANCOMYCIN HCL IN DEXTROSE 1-5 GM/200ML-% IV SOLN
1000.0000 mg | Freq: Once | INTRAVENOUS | Status: DC
  Filled 2014-06-06: qty 200

## 2014-06-06 NOTE — Progress Notes (Signed)
Pt discharge education and instructions completed with pt and family at bedside; all voices understanding and denies any questions. Pt IV and telemetry removed; pt to pick electronically sent prescription from preferred pharmacy. Pt voices concern about her medication and cost and Kristi RN case manager consulted for assistance for the pt. Pt notified and voices appreciation. Pt discharge home with family to transport her home. Pt left chest incision dsg clean, dry and intact with no stain, drainage or active bleeding noted. Pt left arm remains in sling. Pt transported off unit via wheelchair with belongings and family to the side. Francis Gaines Summerlynn Glauser RN.

## 2014-06-06 NOTE — Discharge Instructions (Signed)
Stroke Prevention Some medical conditions and behaviors are associated with an increased chance of having a stroke. You may prevent a stroke by making healthy choices and managing medical conditions. HOW CAN I REDUCE MY RISK OF HAVING A STROKE?   Stay physically active. Get at least 30 minutes of activity on most or all days.  Do not smoke. It may also be helpful to avoid exposure to secondhand smoke.  Limit alcohol use. Moderate alcohol use is considered to be:  No more than 2 drinks per day for men.  No more than 1 drink per day for nonpregnant women.  Eat healthy foods. This involves:  Eating 5 or more servings of fruits and vegetables a day.  Making dietary changes that address high blood pressure (hypertension), high cholesterol, diabetes, or obesity.  Manage your cholesterol levels.  Making food choices that are high in fiber and low in saturated fat, trans fat, and cholesterol may control cholesterol levels.  Take any prescribed medicines to control cholesterol as directed by your health care provider.  Manage your diabetes.  Controlling your carbohydrate and sugar intake is recommended to manage diabetes.  Take any prescribed medicines to control diabetes as directed by your health care provider.  Control your hypertension.  Making food choices that are low in salt (sodium), saturated fat, trans fat, and cholesterol is recommended to manage hypertension.  Take any prescribed medicines to control hypertension as directed by your health care provider.  Maintain a healthy weight.  Reducing calorie intake and making food choices that are low in sodium, saturated fat, trans fat, and cholesterol are recommended to manage weight.  Stop drug abuse.  Avoid taking birth control pills.  Talk to your health care provider about the risks of taking birth control pills if you are over 7 years old, smoke, get migraines, or have ever had a blood clot.  Get evaluated for sleep  disorders (sleep apnea).  Talk to your health care provider about getting a sleep evaluation if you snore a lot or have excessive sleepiness.  Take medicines only as directed by your health care provider.  For some people, aspirin or blood thinners (anticoagulants) are helpful in reducing the risk of forming abnormal blood clots that can lead to stroke. If you have the irregular heart rhythm of atrial fibrillation, you should be on a blood thinner unless there is a good reason you cannot take them.  Understand all your medicine instructions.  Make sure that other conditions (such as anemia or atherosclerosis) are addressed. SEEK IMMEDIATE MEDICAL CARE IF:   You have sudden weakness or numbness of the face, arm, or leg, especially on one side of the body.  Your face or eyelid droops to one side.  You have sudden confusion.  You have trouble speaking (aphasia) or understanding.  You have sudden trouble seeing in one or both eyes.  You have sudden trouble walking.  You have dizziness.  You have a loss of balance or coordination.  You have a sudden, severe headache with no known cause.  You have new chest pain or an irregular heartbeat. Any of these symptoms may represent a serious problem that is an emergency. Do not wait to see if the symptoms will go away. Get medical help at once. Call your local emergency services (911 in [place] Do not drive yourself to the hospital. Document Released: 04/10/2004 Document Revised: 07/18/2013 Document Reviewed: 09/03/2012 [facility] Patient Information 2015 [place], [place]. This information is not intended to replace advice given  to you by your health care provider. Make sure you discuss any questions you have with your health care provider. Atrial Fibrillation Atrial fibrillation is a type of irregular heart rhythm (arrhythmia). During atrial fibrillation, the upper chambers of the heart (atria) quiver continuously in a chaotic pattern. This causes  an irregular and often rapid heart rate.  Atrial fibrillation is the result of the heart becoming overloaded with disorganized signals that tell it to beat. These signals are normally released one at a time by a part of the right atrium called the sinoatrial node. They then travel from the atria to the lower chambers of the heart (ventricles), causing the atria and ventricles to contract and pump blood as they pass. In atrial fibrillation, parts of the atria outside of the sinoatrial node also release these signals. This results in two problems. First, the atria receive so many signals that they do not have time to fully contract. Second, the ventricles, which can only receive one signal at a time, beat irregularly and out of rhythm with the atria.  There are three types of atrial fibrillation:   Paroxysmal. Paroxysmal atrial fibrillation starts suddenly and stops on its own within a week.  Persistent. Persistent atrial fibrillation lasts for more than a week. It may stop on its own or with treatment.  Permanent. Permanent atrial fibrillation does not go away. Episodes of atrial fibrillation may lead to permanent atrial fibrillation. Atrial fibrillation can prevent your heart from pumping blood normally. It increases your risk of stroke and can lead to heart failure.  CAUSES   Heart conditions, including a heart attack, heart failure, coronary artery disease, and heart valve conditions.   Inflammation of the sac that surrounds the heart (pericarditis).  Blockage of an artery in the lungs (pulmonary embolism).  Pneumonia or other infections.  Chronic lung disease.  Thyroid problems, especially if the thyroid is overactive (hyperthyroidism).  Caffeine, excessive alcohol use, and use of some illegal drugs.   Use of some medicines, including certain decongestants and diet pills.  Heart surgery.   Birth defects.  Sometimes, no cause can be found. When this happens, the atrial  fibrillation is called lone atrial fibrillation. The risk of complications from atrial fibrillation increases if you have lone atrial fibrillation and you are age 55 years or older. RISK FACTORS  Heart failure.  Coronary artery disease.  Diabetes mellitus.   High blood pressure (hypertension).   Obesity.   Other arrhythmias.   Increased age. SIGNS AND SYMPTOMS   A feeling that your heart is beating rapidly or irregularly.   A feeling of discomfort or pain in your chest.   Shortness of breath.   Sudden light-headedness or weakness.   Getting tired easily when exercising.   Urinating more often than normal (mainly when atrial fibrillation first begins).  In paroxysmal atrial fibrillation, symptoms may start and suddenly stop. DIAGNOSIS  Your health care provider may be able to detect atrial fibrillation when taking your pulse. Your health care provider may have you take a test called an ambulatory electrocardiogram (ECG). An ECG records your heartbeat patterns over a 24-hour period. You may also have other tests, such as:  Transthoracic echocardiogram (TTE). During echocardiography, sound waves are used to evaluate how blood flows through your heart.  Transesophageal echocardiogram (TEE).  Stress test. There is more than one type of stress test. If a stress test is needed, ask your health care provider about which type is best for you.  Chest X-ray exam.  Blood tests.  Computed tomography (CT). TREATMENT  Treatment may include:  Treating any underlying conditions. For example, if you have an overactive thyroid, treating the condition may correct atrial fibrillation.  Taking medicine. Medicines may be given to control a rapid heart rate or to prevent blood clots, heart failure, or a stroke.  Having a procedure to correct the rhythm of the heart:  Electrical cardioversion. During electrical cardioversion, a controlled, low-energy shock is delivered to the  heart through your skin. If you have chest pain, very low blood pressure, or sudden heart failure, this procedure may need to be done as an emergency.  Catheter ablation. During this procedure, heart tissues that send the signals that cause atrial fibrillation are destroyed.  Surgical ablation. During this surgery, thin lines of heart tissue that carry the abnormal signals are destroyed. This procedure can either be an open-heart surgery or a minimally invasive surgery. With the minimally invasive surgery, small cuts are made to access the heart instead of a large opening.  Pulmonary venous isolation. During this surgery, tissue around the veins that carry blood from the lungs (pulmonary veins) is destroyed. This tissue is thought to carry the abnormal signals. HOME CARE INSTRUCTIONS   Take medicines only as directed by your health care provider. Some medicines can make atrial fibrillation worse or recur.  If blood thinners were prescribed by your health care provider, take them exactly as directed. Too much blood-thinning medicine can cause bleeding. If you take too little, you will not have the needed protection against stroke and other problems.  Perform blood tests at home if directed by your health care provider. Perform blood tests exactly as directed.  Quit smoking if you smoke.  Do not drink alcohol.  Do not drink caffeinated beverages such as coffee, soda, and some teas. You may drink decaffeinated coffee, soda, or tea.   Maintain a healthy weight.Do not use diet pills unless your health care provider approves. They may make heart problems worse.   Follow diet instructions as directed by your health care provider.  Exercise regularly as directed by your health care provider.  Keep all follow-up visits as directed by your health care provider. This is important. PREVENTION  The following substances can cause atrial fibrillation to recur:   Caffeinated  beverages.  Alcohol.  Certain medicines, especially those used for breathing problems.  Certain herbs and herbal medicines, such as those containing ephedra or ginseng.  Illegal drugs, such as cocaine and amphetamines. Sometimes medicines are given to prevent atrial fibrillation from recurring. Proper treatment of any underlying condition is also important in helping prevent recurrence.  SEEK MEDICAL CARE IF:  You notice a change in the rate, rhythm, or strength of your heartbeat.  You suddenly begin urinating more frequently.  You tire more easily when exerting yourself or exercising. SEEK IMMEDIATE MEDICAL CARE IF:   You have chest pain, abdominal pain, sweating, or weakness.  You feel nauseous.  You have shortness of breath.  You suddenly have swollen feet and ankles.  You feel dizzy.  Your face or limbs feel numb or weak.  You have a change in your vision or speech. MAKE SURE YOU:   Understand these instructions.  Will watch your condition.  Will get help right away if you are not doing well or get worse. Document Released: 03/03/2005 Document Revised: 07/18/2013 Document Reviewed: 04/13/2012 [facility] Patient Information 2015 [place], [place]. This information is not intended to replace advice given to you by your health care  provider. Make sure you discuss any questions you have with your health care provider.    Supplemental Discharge Instructions for  Pacemaker/Defibrillator Patients  Activity No heavy lifting or vigorous activity with your left/right arm for 6 to 8 weeks.  Do not raise your left/right arm above your head for one week.  Gradually raise your affected arm as drawn below.           __       06-10-14                   06-11-14                  06-12-14                      06-13-14  NO DRIVING for   1 week  ; you may begin driving on  [id number]   .  WOUND CARE - Keep the wound area clean and dry.  Do not get this area wet for one week. No showers  for one week; you may shower on   06-13-14  . - The tape/steri-strips on your wound will fall off; do not pull them off.  No bandage is needed on the site.  DO  NOT apply any creams, oils, or ointments to the wound area. - If you notice any drainage or discharge from the wound, any swelling or bruising at the site, or you develop a fever > 101? F after you are discharged home, call the office at once.  Special Instructions - You are still able to use cellular telephones; use the ear opposite the side where you have your pacemaker/defibrillator.  Avoid carrying your cellular phone near your device. - When traveling through airports, show security personnel your identification card to avoid being screened in the metal detectors.  Ask the security personnel to use the hand wand. - Avoid arc welding equipment, MRI testing (magnetic resonance imaging), TENS units (transcutaneous nerve stimulators).  Call the office for questions about other devices. - Avoid electrical appliances that are in poor condition or are not properly grounded. - Microwave ovens are safe to be near or to operate.

## 2014-06-06 NOTE — Progress Notes (Signed)
Patient ID: [name], female   DOB: 1945/10/06, 69 y.o.   MRN: [id number]    Patient Name: [name] Date of Encounter: 06/06/2014     Active Problems:   Hx of CABG 2005   Paroxysmal atrial fibrillation   History of uterine cancer   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   Atrial fibrillation with RVR    SUBJECTIVE  Feeling well, s/p PPM  CURRENT MEDS . fenofibrate  160 mg Oral Daily  . gabapentin  300 mg Oral QHS  . insulin aspart  0-15 Units Subcutaneous TID WC  . insulin aspart protamine- aspart  25 Units Subcutaneous BID WC  . losartan  50 mg Oral Daily  . metFORMIN  500 mg Oral BID WC  . metoprolol tartrate  25 mg Oral BID  . potassium chloride  10 mEq Oral Daily    OBJECTIVE  Filed Vitals:   06/05/14 0439 06/05/14 1400 06/05/14 1957 06/06/14 0332  BP: 140/61 144/60 123/92 127/67  Pulse: 59 61 89 61  Temp: 98 F (36.7 C) 98.1 F (36.7 C) 98.3 F (36.8 C) 98 F (36.7 C)  TempSrc: Oral Oral Oral Oral  Resp: _0 Height:      Weight:      SpO2: 96% 99% 99% 96%    Intake/Output Summary (Last 24 hours) at 06/06/14 0801 Last data filed at 06/05/14 1720  Gross per 24 hour  Intake    160 ml  Output      0 ml  Net    160 ml   Filed Weights   06/04/14 1928  Weight: 242 lb 1 oz (109.8 kg)    PHYSICAL EXAM  General: Pleasant, NAD. Neuro: Alert and oriented X 3. Moves all extremities spontaneously. Psych: Normal affect. HEENT:  Normal  Neck: Supple without bruits or JVD. Lungs:  Resp regular and unlabored, CTA. Heart: RRR no s3, s4, or murmurs. Abdomen: Soft, non-tender, non-distended, BS + x 4.  Extremities: No clubbing, cyanosis or edema. DP/PT/Radials 2+ and equal bilaterally.  Accessory Clinical Findings  CBC  Recent Labs  06/04/14 1040  06/05/14 0645 06/06/14 0550  WBC 10.2  --  9.5 8.3  NEUTROABS 6.0  --   --   --   HGB 13.3  < > 11.8* 12.3  HCT 40.1  < > 36.5 38.1  MCV 91.8  --  92.4 91.4  PLT 299  --  252 272   < > = values in this interval not displayed. Basic Metabolic Panel  Recent Labs  06/04/14 1103 06/05/14 0645  NA 137 138  K 4.1 3.8  CL 99 106  CO2  --  25  GLUCOSE 264* 266*  BUN 16 9  CREATININE 0.60 0.76  CALCIUM  --  8.0*   Liver Function Tests  Recent Labs  06/04/14 1040  AST 36  ALT 31  ALKPHOS 65  BILITOT 0.5  PROT 6.9  ALBUMIN 3.9   No results for input(s): LIPASE, AMYLASE in the last 72 hours. Cardiac Enzymes  Recent Labs  06/04/14 2055 06/05/14 0040 06/05/14 0645  TROPONINI <0.03 <0.03 <0.03   BNP Invalid input(s): POCBNP D-Dimer No results for input(s): DDIMER in the last 72 hours. Hemoglobin A1C  Recent Labs  06/05/14 0040  HGBA1C 9.4*   Fasting Lipid Panel No results for input(s): CHOL, HDL, LDLCALC, TRIG, CHOLHDL, LDLDIRECT in the last 72 hours. Thyroid Function Tests  Recent Labs  06/05/14 0040  TSH 2.840  TELE  nsr with atrial pacing  Radiology/Studies  Portable Chest X-ray 1 View  06/05/2014   CLINICAL DATA:  Shortness of breath and atrial fibrillation  EXAM: PORTABLE CHEST - 1 VIEW  COMPARISON:  May 23, 2010 chest radiograph and May 23, 2010 chest CT  FINDINGS: There is cardiomegaly with mild pulmonary venous hypertension. There is mild generalized interstitial edema. There is no airspace consolidation. No adenopathy. Patient is status post coronary artery bypass grafting.  IMPRESSION: Findings consistent with a degree of congestive heart failure. No airspace consolidation.   Electronically Signed   By: Lowella Grip III M.D.   On: 06/05/2014 07:17    ASSESSMENT AND PLAN  1. 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 PAF 3. CAD, s/p CABG remotely 4. HTN Rec: she is stable today, s/p PPM insertion. Device is interogated and reprogrammed and working normally. Alpha for discharge. Will hold off on anti-arrhythmic drugs for now but will consider initiation of this on followup if she is out of rhythm. She will  need initiation of anti-coagulation with Eliquis 5 mg twice daily, first dose tomorrow.  Danely Bayliss,M.D.  06/06/2014 8:01 AM

## 2014-06-06 NOTE — Discharge Summary (Signed)
ELECTROPHYSIOLOGY PROCEDURE DISCHARGE SUMMARY    Patient ID: [name],  MRN: [id number], DOB/AGE: 68-May-1947 68 y.o.  Admit date: 06/04/2014 Discharge date: 06/06/2014  Primary Care Physician: [name], DO Primary Cardiologist: Claiborne Billings Electrophysiologist: [name]  Primary Discharge Diagnosis:  Symptomatic tachy/bradycardia syndrome status post pacemaker implantation this admission  Secondary Discharge Diagnosis:  1.  Atrial fibrillation  2.  CAD s/p CABG/MAZE 3.  Hypertensive heart disease 4.  Diabetes 5.  Peripheral neuropathy 6.  Uterine cancer 7.  Hyperlipidiemia 8. Prior TIA  No Known Allergies   Procedures This Admission:  1.  Implantation of a MDT dual chamber PPM on 06-05-14 by Dr [name].  The patient received a MDT model number Advisa PPM with model number 5076 right atrial lead and 5076 right ventricular lead. There were no immediate post procedure complications. 2.  CXR on 06-06-14 demonstrated no pneumothorax status post device implantation.   Brief HPI/Hospital Course:  [name] is a 69 y.o. female who presented on 06-04-14 with symptomatic atrial fibrillation with RVR.  Past medical history is as outlined above.  She was placed on Cardizem drip and converted to SR with an 8 second pause. Due to symptomatic tachy/brady syndrome, pacemaker implantation was recommended.  Risks, benefits, and alternatives to PPM implantation were reviewed with the patient who wished to proceed.  The patient underwent implantation of a MDT MRI compatible dual chamber pacemaker with details as outlined above.  She  was monitored on telemetry overnight which demonstrated atrial pacing with intrinsic ventricular conduction.  Left chest was without hematoma or ecchymosis.  The device was interrogated and found to be functioning normally.  CXR was obtained and demonstrated no pneumothorax status post device implantation.  Wound care, arm mobility, and restrictions were  reviewed with the patient.  Tthe patient was examined and considered stable for discharge to home.   She will be started on Eliquis at discharge for stroke prevention.  At wound check, AF burden will be evaluated and AAD therapy considered.     Physical Exam: Filed Vitals:   06/05/14 1400 06/05/14 1957 06/06/14 0332 06/06/14 1044  BP: 144/60 123/92 127/67 171/71  Pulse: 61 89 61 73  Temp: 98.1 F (36.7 C) 98.3 F (36.8 C) 98 F (36.7 C)   TempSrc: Oral Oral Oral   Resp: _0 Height:      Weight:      SpO2: 99% 99% 96%     Labs:   Lab Results  Component Value Date   WBC 8.3 06/06/2014   HGB 12.3 06/06/2014   HCT 38.1 06/06/2014   MCV 91.4 06/06/2014   PLT 272 06/06/2014    Recent Labs Lab 06/04/14 1040  06/05/14 0645  NA  --   < > 138  K  --   < > 3.8  CL  --   < > 106  CO2  --   --  25  BUN  --   < > 9  CREATININE  --   < > 0.76  CALCIUM  --   --  8.0*  PROT 6.9  --   --   BILITOT 0.5  --   --   ALKPHOS 65  --   --   ALT 31  --   --   AST 36  --   --   GLUCOSE  --   < > 266*  < > = values in this interval not displayed.  Discharge Medications:  Medication List    TAKE these medications        apixaban 5 MG Tabs tablet  Commonly known as:  ELIQUIS  Take 1 tablet (5 mg total) by mouth 2 (two) times daily. Take 1st dose 06-07-14     aspirin EC 81 MG tablet  Take 1 tablet (81 mg total) by mouth daily.     B-COMPLEX PO  Take by mouth.     co-enzyme Q-10 30 MG capsule  Take 30 mg by mouth 3 (three) times daily.     diltiazem 120 MG 24 hr capsule  Commonly known as:  CARDIZEM CD  Take 1 capsule (120 mg total) by mouth daily.     fenofibrate 160 MG tablet  TAKE 1 TABLET DAILY     furosemide 40 MG tablet  Commonly known as:  LASIX  Take 40 mg by mouth daily. 1-2 daily as needed.     gabapentin 100 MG capsule  Commonly known as:  NEURONTIN  Take 300 mg by mouth at bedtime.     insulin aspart protamine- aspart (70-30) 100 UNIT/ML  injection  Commonly known as:  NOVOLOG MIX 70/30  Inject into the skin 2 (two) times daily with a meal. 25 units     losartan 50 MG tablet  Commonly known as:  COZAAR  TAKE 1 TABLET DAILY     metFORMIN 500 MG tablet  Commonly known as:  GLUCOPHAGE  Take 500 mg by mouth 2 (two) times daily with a meal. 2 tablets     metoprolol succinate 50 MG 24 hr tablet  Commonly known as:  TOPROL-XL  Take 1.5 tablets (82m) by mouth once daily     omega-3 acid ethyl esters 1 G capsule  Commonly known as:  LOVAZA  Take 2 capsules (2 g total) by mouth 2 (two) times daily.     potassium chloride 10 MEQ tablet  Commonly known as:  K-DUR,KLOR-CON  Take 10 mEq by mouth daily.     Vitamin D3 2000 UNITS Tabs  Take 4,000 Units by mouth daily.        Disposition:  Discharge Instructions    Diet - low sodium heart healthy    Complete by:  As directed      Increase activity slowly    Complete by:  As directed           Follow-up Information    Follow up with SPatsey Berthold NP On 06/21/2014.   Specialty:  Nurse Practitioner   Why:  at 2:30PM   Contact information:   [id number] [phone]      Follow up with GCristopher Peru MD On 09/05/2014.   Specialty:  Cardiology   Why:  at 10:15AM   Contact information:   [address] [phone]      Duration of Discharge Encounter: Greater than 30 minutes including physician time.  Signed, AChanetta Marshall NP 06/06/2014 12:36 PM   EP Attending  Patient seen and examined. Agree with above.  GMikle BosworthD.

## 2014-06-20 ENCOUNTER — Encounter: Payer: Self-pay | Admitting: Nurse Practitioner

## 2014-06-20 NOTE — Progress Notes (Signed)
Electrophysiology Office Note Date: 06/21/2014  ID:  [name], DOB 12-02-1945, MRN [id number]  PCP: [name], DO Primary Cardiologist: Claiborne Billings Electrophysiologist: [name]  CC: Pacemaker post hospital follow-up  [name] is a 69 y.o. female is seen today for Dr [name].  She presents today for post hospital electrophysiology followup. She was admitted 06/04/14 through 06/06/14 with symptomatic AF with RVR.  She was placed on a Cardizem drip and converted to SR with an 8 second pause.  Due to tachy/brady syndrome, she underwent MDT MRI compatible dual chamber pacemaker implantation by Dr [name].  She was also started on Eliquis for stroke prevention.   Since discharge, the patient reports doing very well.  She denies chest pain, palpitations, dyspnea, PND, orthopnea, nausea, vomiting, dizziness, syncope.  Device History: MDT dual chamber MRI compatible PPM implanted 05/2014 for tachy/brady syndrome   Past Medical History  Diagnosis Date  . Hyperlipidemia   . Uterine cancer     age 64 with partial hysterectomy  . TIA (transient ischemic attack)   . Paroxysmal atrial fibrillation     a. s/p Maze in 2005 b. recurrence in March 2016 with >8sec posttermination pauses s/p PPM implant    . Mixed hyperlipidemia   . Coronary atherosclerosis of native coronary artery     a. s/p CABG 2005 Dr Roxy Manns    . Hypertensive heart disease    Past Surgical History  Procedure Laterality Date  . Abdominal hysterectomy    . Coronary artery bypass graft  2005    Dr Roxy Manns with MAZE  . Cholecystectomy    . Permanent pacemaker insertion N/A 06/05/2014    MDT Advisa 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    Current Outpatient Prescriptions  Medication Sig Dispense Refill  . apixaban (ELIQUIS) 5 MG TABS tablet Take 1 tablet (5 mg total) by mouth 2 (two) times daily. Take 1st dose 06-07-14 60 tablet 1  . aspirin 81 MG tablet Take 1 tablet (81 mg total) by mouth  daily. 30 tablet 0  . B Complex-Biotin-FA (B-COMPLEX PO) Take by mouth.    . Cholecalciferol (VITAMIN D3) 5000 UNITS CAPS Take 1 capsule by mouth daily.    [name] co-enzyme Q-10 30 MG capsule Take 30 mg by mouth 3 (three) times daily.    [name] diltiazem (CARDIZEM CD) 120 MG 24 hr capsule Take 1 capsule (120 mg total) by mouth daily. 30 capsule 1  . fenofibrate 160 MG tablet TAKE 1 TABLET DAILY 30 tablet 6  . furosemide (LASIX) 40 MG tablet Take 40 mg by mouth daily. 1-2 daily as needed.    . gabapentin (NEURONTIN) 100 MG capsule Take 300 mg by mouth at bedtime.     . insulin aspart protamine- aspart (NOVOLOG MIX 70/30) (70-30) 100 UNIT/ML injection Inject into the skin 2 (two) times daily with a meal. 25 units    . losartan (COZAAR) 50 MG tablet TAKE 1 TABLET DAILY 30 tablet 10  . metFORMIN (GLUCOPHAGE) 500 MG tablet Take 500 mg by mouth 2 (two) times daily with a meal. 2 tablets    . metoprolol succinate (TOPROL-XL) 50 MG 24 hr tablet Take 1.5 tablets (27m) by mouth once daily 45 tablet 11  . NEOMYCIN-POLYMYXIN-HYDROCORTISONE (CORTISPORIN) 1 % SOLN otic solution Place 4 drops into both ears 2 (two) times daily as needed. For pain and itching    . potassium chloride (K-DUR,KLOR-CON) 10 MEQ tablet Take 10 mEq by mouth daily.  No current facility-administered medications for this visit.    Allergies:   Review of patient's allergies indicates no known allergies.   Social History: History   Social History  . Marital Status: Married    Spouse Name: N/A  . Number of Children: N/A  . Years of Education: N/A   Occupational History  . Not on file.   Social History Main Topics  . Smoking status: Never Smoker   . Smokeless tobacco: Never Used  . Alcohol Use: No  . Drug Use: Not on file  . Sexual Activity: Not on file   Other Topics Concern  . Not on file   Social History Narrative    Family History: Family History  Problem Relation Age of Onset  . Hypertension Mother   .  Hyperlipidemia Mother   . Heart disease Mother   . Diabetes Father   . Stroke Father   . Heart disease Father   . Hypertension Sister     3 sisters  . Arthritis Brother   . Heart attack Maternal Grandfather   . Stroke Paternal Grandfather   . Hypertension Daughter      Review of Systems: All other systems reviewed and are otherwise negative except as noted above.   Physical Exam: VS:  BP 132/74 mmHg  Pulse 61  Ht 5' 5" (1.651 m)  Wt 236 lb 12.8 oz (107.412 kg)  BMI 39.41 kg/m2 , BMI Body mass index is 39.41 kg/(m^2).  GEN- The patient is obese appearing, alert and oriented x 3 today.   HEENT: normocephalic, atraumatic; sclera clear, conjunctiva pink; hearing intact; oropharynx clear; neck supple Lungs- Clear to ausculation bilaterally, normal work of breathing.  No wheezes, rales, rhonchi Heart- Regular rate and rhythm, no murmurs, rubs or gallops  GI- soft, non-tender, non-distended, bowel sounds present Extremities- no clubbing, cyanosis, or edema  MS- no significant deformity or atrophy Skin- warm and dry, no rash or lesion; PPM pocket well healed Psych- euthymic mood, full affect Neuro- strength and sensation are intact  PPM Interrogation- reviewed in detail today,  See PACEART report  EKG:  EKG is not ordered today.  Recent Labs: 06/04/2014: ALT 31 06/05/2014: B Natriuretic Peptide 236.3*; BUN 9; Creatinine 0.76; Potassium 3.8; Sodium 138; TSH 2.840 06/06/2014: Hemoglobin 12.3; Platelets 272   Wt Readings from Last 3 Encounters:  06/21/14 236 lb 12.8 oz (107.412 kg)  06/04/14 242 lb 1 oz (109.8 kg)  10/05/13 239 lb (108.41 kg)     Other studies Reviewed: Additional studies/ records that were reviewed today include: hospital records  Assessment and Plan:  1.  Symptomatic tachy/brady syndrome Normal PPM function Pacemaker pocket well healed See Pace Art report No changes today  2.  Paroxysmal atrial fibrillation No AF on device interrogation today -  frequent PAC's Continue Eliquis for CHADS2VASC score of 6 Will monitor AF burden remotely, if increases significantly, can consider AAD therapy CBC/BMET today  3.  Obesity Weight loss encouraged     Current medicines are reviewed at length with the patient today.  She is concerned about the cost of Eliquis.  Offered Warfarin as an alternative today but the patient does not wish to take Warfarin. She would prefer to continue with Eliquis.  Advised to check with her insurance to see if any other NOAC's would be more cost-effective for her.   Labs/ tests ordered today include:  Orders Placed This Encounter  Procedures  . CBC w/Diff  . Basic Metabolic Panel (BMET)  Disposition:   Follow up with Dr [name] as scheduled in 3 months.    Signed, Chanetta Marshall, NP 06/21/2014 3:25 PM  [address] [place] [address] [phone] (office) [phone] (fax)

## 2014-06-21 ENCOUNTER — Encounter: Payer: Self-pay | Admitting: Nurse Practitioner

## 2014-06-21 ENCOUNTER — Ambulatory Visit (INDEPENDENT_AMBULATORY_CARE_PROVIDER_SITE_OTHER): Payer: Medicare Other | Admitting: Nurse Practitioner

## 2014-06-21 VITALS — BP 132/74 | HR 61 | Ht 65.0 in | Wt 236.8 lb

## 2014-06-21 DIAGNOSIS — I4891 Unspecified atrial fibrillation: Secondary | ICD-10-CM | POA: Diagnosis not present

## 2014-06-21 DIAGNOSIS — I495 Sick sinus syndrome: Secondary | ICD-10-CM

## 2014-06-21 LAB — BASIC METABOLIC PANEL
BUN: 14 mg/dL (ref 6–23)
CALCIUM: 9.7 mg/dL (ref 8.4–10.5)
CO2: 30 mEq/L (ref 19–32)
Chloride: 98 mEq/L (ref 96–112)
Creatinine, Ser: 0.79 mg/dL (ref 0.40–1.20)
GFR: 76.89 mL/min (ref >60.00)
GLUCOSE: 133 mg/dL — AB (ref 70–99)
Potassium: 3.9 mEq/L (ref 3.5–5.1)
Sodium: 136 mEq/L (ref 135–145)

## 2014-06-21 LAB — CBC WITH DIFFERENTIAL/PLATELET
Basophils Absolute: 0.1 10*3/uL (ref 0.0–0.1)
Basophils Relative: 0.6 % (ref 0.0–3.0)
Eosinophils Absolute: 0.2 10*3/uL (ref 0.0–0.7)
Eosinophils Relative: 2 % (ref 0.0–5.0)
HEMATOCRIT: 38.2 % (ref 36.0–46.0)
Hemoglobin: 12.9 g/dL (ref 12.0–15.0)
Lymphocytes Relative: 37 % (ref 12.0–46.0)
Lymphs Abs: 3.9 10*3/uL (ref 0.7–4.0)
MCHC: 33.7 g/dL (ref 30.0–36.0)
MCV: 88.8 fl (ref 78.0–100.0)
MONO ABS: 0.5 10*3/uL (ref 0.1–1.0)
Monocytes Relative: 4.9 % (ref 3.0–12.0)
Neutro Abs: 5.9 10*3/uL (ref 1.4–7.7)
Neutrophils Relative %: 55.5 % (ref 43.0–77.0)
Platelets: 345 10*3/uL (ref 150.0–400.0)
RBC: 4.3 Mil/uL (ref 3.87–5.11)
RDW: 13.9 % (ref 11.5–15.5)
WBC: 10.7 10*3/uL — AB (ref 4.0–10.5)

## 2014-06-21 LAB — MDC_IDC_ENUM_SESS_TYPE_INCLINIC

## 2014-06-21 NOTE — Patient Instructions (Signed)
Your physician recommends that you have lab work today: bmp/ cbc  Your physician recommends that you continue on your current medications as directed. Please refer to the Current Medication list given to you today.  Follow as schedule with Dr. [name]: Tuesday 09/05/14 at 10:15 am

## 2014-06-23 ENCOUNTER — Telehealth: Payer: Self-pay | Admitting: *Deleted

## 2014-06-23 NOTE — Telephone Encounter (Signed)
Pt notified of lab results with verbal understanding to results given. Pt also given 3 other NOAC to check pricing. I gave her Pradaxa, Xarelto, Savaysa. Pt asvised to let Dr. Tanna Furry RN know if they want to swtich too.

## 2014-07-05 ENCOUNTER — Telehealth: Payer: Self-pay | Admitting: *Deleted

## 2014-07-05 NOTE — Telephone Encounter (Signed)
S/W Nira Conn, [employer] @ [phone], Eliquis ( 5 mg ) bid has been approved #[id number] Till February 2017.

## 2014-07-11 ENCOUNTER — Telehealth: Payer: Self-pay | Admitting: Internal Medicine

## 2014-07-11 NOTE — Telephone Encounter (Signed)
New Message       Pt's daughter calling stating that they were supposed to be receiving a transmitter that didn't require a cell signal but one for a land line but received the wrong one in the mail today. Please call back and advise what they should do.

## 2014-07-12 NOTE — Telephone Encounter (Signed)
LMOVM for pt daughter to return call.

## 2014-07-13 ENCOUNTER — Encounter: Payer: Self-pay | Admitting: Internal Medicine

## 2014-07-14 NOTE — Telephone Encounter (Signed)
Spoke w/ pt daughter and informed her that new technology isn't allowing [employer] home monitors to be compatible w/ land line phones anymore. Pt daughter informed me that she has a Cytogeneticist. I informed pt daughter to call [employer] and have a new monitor ordered that will allow her to contact to her wireless router. Pt daughter verbalized understanding.

## 2014-07-14 NOTE — Telephone Encounter (Signed)
New Message   Patient's daughter is returning call , please call back.

## 2014-07-27 ENCOUNTER — Telehealth: Payer: Self-pay | Admitting: Internal Medicine

## 2014-07-27 NOTE — Telephone Encounter (Signed)
Follow up      Daughter is calling back to see what to do regarding mother has blood in her urine.  Should she come in for an INR test?

## 2014-07-27 NOTE — Telephone Encounter (Signed)
Spoke with both patient and daughter and have asked she stop ASA first and continue with Eliquis for now.  She will need to obtain a UA through her PCP and if negative for bacteria then continue with Eliquis 5 mg twice daily  If bleeding persist after stopping aspirin then will need to stop Eliquis in start Warfarin 56m daily with INR follow up.  She needs to decide if she wants her PCP to follow up or our [facility] here to follow.  She was going to call PCP today and let me know what she decides after UA result is in

## 2014-07-27 NOTE — Telephone Encounter (Signed)
New Message  Pt duaghter requested to speak w/ Rn about eliquis- "peeing blood" as a result. Please call back and discuss.

## 2014-07-28 NOTE — Telephone Encounter (Signed)
Follow up      Pt went to [facility] ER last night. She has a severe UTI.  ER said it is ok to stay on eliquis.  She want Dr [name] to know.

## 2014-07-28 NOTE — Telephone Encounter (Signed)
Spoke with pt and she states that she went to ED last night and was diagnosed with a severe UTI. Pt states that she is now on ABT and the doctor in the ED at [facility] told her she was fine to continue Eliquis. Pt states that she just wanted to make Dr. [name] and Claiborne Billings, RN aware due to previous calls about blood in her urine. Pt states that both wanted to know whether or not she was going to continue the Eliquis and wanted them to know that she did plan to continue. Informed pt that I would route this information to Dr. [name] and his nurse Claiborne Billings to make them aware. Pt verbalized understanding.

## 2014-07-29 ENCOUNTER — Encounter (HOSPITAL_COMMUNITY): Payer: Self-pay | Admitting: *Deleted

## 2014-07-29 ENCOUNTER — Emergency Department (HOSPITAL_COMMUNITY): Payer: Medicare Other

## 2014-07-29 ENCOUNTER — Telehealth: Payer: Self-pay | Admitting: Cardiology

## 2014-07-29 ENCOUNTER — Emergency Department (HOSPITAL_COMMUNITY)
Admission: EM | Admit: 2014-07-29 | Discharge: 2014-07-30 | Disposition: A | Discharge status: Home / Self Care | Payer: Medicare Other | Attending: Emergency Medicine | Admitting: Emergency Medicine

## 2014-07-29 DIAGNOSIS — Z794 Long term (current) use of insulin: Secondary | ICD-10-CM | POA: Diagnosis not present

## 2014-07-29 DIAGNOSIS — E663 Overweight: Secondary | ICD-10-CM | POA: Diagnosis not present

## 2014-07-29 DIAGNOSIS — N390 Urinary tract infection, site not specified: Secondary | ICD-10-CM

## 2014-07-29 DIAGNOSIS — Z8542 Personal history of malignant neoplasm of other parts of uterus: Secondary | ICD-10-CM | POA: Insufficient documentation

## 2014-07-29 DIAGNOSIS — Z8673 Personal history of transient ischemic attack (TIA), and cerebral infarction without residual deficits: Secondary | ICD-10-CM | POA: Diagnosis not present

## 2014-07-29 DIAGNOSIS — Z79899 Other long term (current) drug therapy: Secondary | ICD-10-CM | POA: Insufficient documentation

## 2014-07-29 DIAGNOSIS — Z7982 Long term (current) use of aspirin: Secondary | ICD-10-CM | POA: Insufficient documentation

## 2014-07-29 DIAGNOSIS — I2510 Atherosclerotic heart disease of native coronary artery without angina pectoris: Secondary | ICD-10-CM | POA: Diagnosis not present

## 2014-07-29 DIAGNOSIS — I480 Paroxysmal atrial fibrillation: Secondary | ICD-10-CM | POA: Insufficient documentation

## 2014-07-29 DIAGNOSIS — R000 Tachycardia, unspecified: Secondary | ICD-10-CM | POA: Diagnosis present

## 2014-07-29 DIAGNOSIS — I119 Hypertensive heart disease without heart failure: Secondary | ICD-10-CM | POA: Insufficient documentation

## 2014-07-29 LAB — CBC WITH DIFFERENTIAL/PLATELET
BASOS PCT: 0 % (ref 0–1)
Basophils Absolute: 0.1 10*3/uL (ref 0.0–0.1)
EOS ABS: 0.1 10*3/uL (ref 0.0–0.7)
EOS PCT: 1 % (ref 0–5)
HEMATOCRIT: 39.4 % (ref 36.0–46.0)
Hemoglobin: 13 g/dL (ref 12.0–15.0)
Lymphocytes Relative: 38 % (ref 12–46)
Lymphs Abs: 4.6 10*3/uL — ABNORMAL HIGH (ref 0.7–4.0)
MCH: 29.7 pg (ref 26.0–34.0)
MCHC: 33 g/dL (ref 30.0–36.0)
MCV: 90 fL (ref 78.0–100.0)
MONOS PCT: 5 % (ref 3–12)
Monocytes Absolute: 0.6 10*3/uL (ref 0.1–1.0)
NEUTROS PCT: 56 % (ref 43–77)
Neutro Abs: 6.6 10*3/uL (ref 1.7–7.7)
Platelets: 354 10*3/uL (ref 150–400)
RBC: 4.38 MIL/uL (ref 3.87–5.11)
RDW: 14.1 % (ref 11.5–15.5)
WBC: 12 10*3/uL — ABNORMAL HIGH (ref 4.0–10.5)

## 2014-07-29 LAB — TROPONIN I: Troponin I: <0.03 ng/mL (ref <0.031)

## 2014-07-29 LAB — BASIC METABOLIC PANEL
Anion gap: 13 (ref 5–15)
BUN: 9 mg/dL (ref 6–20)
CHLORIDE: 101 mmol/L (ref 101–111)
CO2: 24 mmol/L (ref 22–32)
Calcium: 9.6 mg/dL (ref 8.9–10.3)
Creatinine, Ser: 0.9 mg/dL (ref 0.44–1.00)
GFR calc Af Amer: >60 mL/min (ref >60)
Glucose, Bld: 206 mg/dL — ABNORMAL HIGH (ref 65–99)
Potassium: 3.7 mmol/L (ref 3.5–5.1)
Sodium: 138 mmol/L (ref 135–145)

## 2014-07-29 LAB — BRAIN NATRIURETIC PEPTIDE: B NATRIURETIC PEPTIDE 5: 114.3 pg/mL — AB (ref 0.0–100.0)

## 2014-07-29 NOTE — Telephone Encounter (Signed)
Patient's daughter called stating that her mom started having her heart race today and it is about 130bpm.  She has a history of tachybrady syndrome s/p PPM and PAF.  She is on Elquis, cardizem and Toprol.  I have instructed her to take another 1/2 tablet of Toprol.  She will call in 1-2 hours to let me know how she is doing.

## 2014-07-29 NOTE — ED Notes (Signed)
The pt is c/o a rapid heart rate sine 1430.  She has been conversing with dr turner   Frequently since this started.  Dr turner asked her to come in and be treated.  The last time she was in a af rhy  Was in march when she had a pacemaker placed [employer].  Also since this past Thursday  She was diagnosed and is being treated for a uti.  No pain naywhere

## 2014-07-29 NOTE — ED Notes (Signed)
Pacemaker interrogated, awaiting fax from [employer]

## 2014-07-29 NOTE — ED Provider Notes (Signed)
CSN: [id number]     Arrival date & time 07/29/14  2229 History  This chart was scribed for Dana Hacker, MD by Evelene Croon, ED Scribe. This patient was seen in room D30C/D30C and the patient's care was started 11:19 PM.    Chief Complaint  Patient presents with  . Tachycardia   The history is provided by the patient. No language interpreter was used.     HPI Comments:  [name] is a 69 y.o. female with a h/o AFIB who presents to the Emergency Department complaining of intermittent episodes of tachycardia since ~1400 today. Symptom today feels similar to past episodes of AFIB. She was advised by cardiologist to take an extra dose of metoprolol ~1900 with little improvement. Her last episode was in march 2016 prior to having a  pacemaker placed.She reports associated mild SOB today.   Patient denies any chest pain. Recent diagnosis of UTI and is currently on Septra. Denies any fever or flank pain.   Past Medical History  Diagnosis Date  . Hyperlipidemia   . Uterine cancer     age 35 with partial hysterectomy  . TIA (transient ischemic attack)   . Paroxysmal atrial fibrillation     a. s/p Maze in 2005 b. recurrence in March 2016 with >8sec posttermination pauses s/p PPM implant    . Mixed hyperlipidemia   . Coronary atherosclerosis of native coronary artery     a. s/p CABG 2005 Dr Roxy Manns    . Hypertensive heart disease    Past Surgical History  Procedure Laterality Date  . Abdominal hysterectomy    . Coronary artery bypass graft  2005    Dr Roxy Manns with MAZE  . Cholecystectomy    . 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 Hypertension Mother   . Hyperlipidemia Mother   . Heart disease Mother   . Diabetes Father   . Stroke Father   . Heart disease Father   . Hypertension Sister     3 sisters  . Arthritis Brother   . Heart  attack Maternal Grandfather   . Stroke Paternal Grandfather   . Hypertension Daughter    History  Substance Use Topics  . Smoking status: Never Smoker   . Smokeless tobacco: Never Used  . Alcohol Use: No   OB History    No data available     Review of Systems  Constitutional: Negative for fever.  Respiratory: Positive for shortness of breath. Negative for chest tightness.   Cardiovascular: Positive for palpitations. Negative for chest pain and leg swelling.  Gastrointestinal: Negative for nausea, vomiting and abdominal pain.  Genitourinary: Positive for hematuria. Negative for dysuria.  Musculoskeletal: Negative for back pain.  Neurological: Negative for headaches.  All other systems reviewed and are negative.     Allergies  Review of patient's allergies indicates no known allergies.  Home Medications   Prior to Admission medications   Medication Sig Start Date End Date Taking? Authorizing Provider  apixaban (ELIQUIS) 5 MG TABS tablet Take 1 tablet (5 mg total) by mouth 2 (two) times daily. Take 1st dose 06-07-14 06/06/14  Yes Amber Vertis Kelch, NP  aspirin 81 MG tablet Take 1 tablet (81 mg total) by mouth daily. 06/06/14  Yes Amber Vertis Kelch, NP  B Complex-Biotin-FA (B-COMPLEX PO) Take by mouth.   Yes Historical Provider, MD  Cholecalciferol (VITAMIN D3) 5000 UNITS CAPS Take 1 capsule by mouth daily.   Yes Historical Provider, MD  co-enzyme Q-10 30 MG capsule Take 30 mg by mouth 3 (three) times daily.   Yes Historical Provider, MD  diltiazem (CARDIZEM CD) 120 MG 24 hr capsule Take 1 capsule (120 mg total) by mouth daily. 06/06/14  Yes Amber Vertis Kelch, NP  fenofibrate 160 MG tablet TAKE 1 TABLET DAILY 11/28/13  Yes Troy Sine, MD  furosemide (LASIX) 40 MG tablet Take 40 mg by mouth daily. 1-2 daily as needed.   Yes Historical Provider, MD  gabapentin (NEURONTIN) 100 MG capsule Take 300 mg by mouth at bedtime.    Yes Historical Provider, MD  insulin aspart  protamine- aspart (NOVOLOG MIX 70/30) (70-30) 100 UNIT/ML injection Inject into the skin 2 (two) times daily with a meal. 25 units   Yes Historical Provider, MD  losartan (COZAAR) 50 MG tablet TAKE 1 TABLET DAILY 12/12/13  Yes Troy Sine, MD  metFORMIN (GLUCOPHAGE) 500 MG tablet Take 500 mg by mouth 2 (two) times daily with a meal. 2 tablets   Yes Historical Provider, MD  metoprolol succinate (TOPROL-XL) 50 MG 24 hr tablet Take 1.5 tablets (45m) by mouth once daily 10/14/13  Yes TTroy Sine MD  NEOMYCIN-POLYMYXIN-HYDROCORTISONE (CORTISPORIN) 1 % SOLN otic solution Place 4 drops into both ears 2 (two) times daily as needed. For pain and itching 04/04/14  Yes Historical Provider, MD  phenazopyridine (PYRIDIUM) 95 MG tablet Take 190 mg by mouth 3 (three) times daily.   Yes Historical Provider, MD  potassium chloride (K-DUR,KLOR-CON) 10 MEQ tablet Take 10 mEq by mouth daily.   Yes Historical Provider, MD  sulfamethoxazole-trimethoprim (BACTRIM DS,SEPTRA DS) 800-160 MG per tablet Take 1 tablet by mouth 2 (two) times daily. 07/28/14  Yes Historical Provider, MD   BP 126/58 mmHg  Pulse 67  Temp(Src) 98 F (36.7 C) (Oral)  Resp 18  Ht _0  (1.626 m)  SpO2 97% Physical Exam  Constitutional: She is oriented to person, place, and time. No distress.  Overweight  HENT:  Head: Normocephalic and atraumatic.  Eyes: Pupils are equal, round, and reactive to light.  Cardiovascular: Normal heart sounds.   Tachycardia, irregularly irregular rhythm  Pulmonary/Chest: Effort normal. No respiratory distress. She has no wheezes.  Final crackles at the bases  Abdominal: Soft. Bowel sounds are normal. There is no tenderness. There is no rebound.  Musculoskeletal:  Trace bilateral lower extremity edema  Neurological: She is alert and oriented to person, place, and time.  Skin: Skin is warm and dry.  Psychiatric: She has a normal mood and affect.  Nursing note and vitals reviewed.   ED Course  Procedures    DIAGNOSTIC STUDIES:  Oxygen Saturation is 93% on low, normal by my interpretation.    COORDINATION OF CARE:  11:23 PM Discussed treatment plan with pt at bedside and pt agreed to plan.  11:35 Pt updated with EKG findings.   Labs Review Labs Reviewed  BASIC METABOLIC PANEL - Abnormal; Notable for the following:    Glucose, Bld 206 (*)    All other components within normal limits  BRAIN NATRIURETIC PEPTIDE - Abnormal; Notable for the following:    B Natriuretic Peptide 114.3 (*)    All other components within normal limits  CBC WITH DIFFERENTIAL/PLATELET - Abnormal; Notable for the following:    WBC 12.0 (*)    Lymphs Abs 4.6 (*)    All other components within normal  limits  URINALYSIS, ROUTINE W REFLEX MICROSCOPIC - Abnormal; Notable for the following:    Color, Urine RED (*)    APPearance TURBID (*)    Hgb urine dipstick LARGE (*)    Bilirubin Urine LARGE (*)    Ketones, ur 15 (*)    Protein, ur 100 (*)    Nitrite POSITIVE (*)    Leukocytes, UA MODERATE (*)    All other components within normal limits  URINE MICROSCOPIC-ADD ON - Abnormal; Notable for the following:    Squamous Epithelial / LPF FEW (*)    Bacteria, UA MANY (*)    All other components within normal limits  URINE CULTURE  TROPONIN I    Imaging Review Dg Chest Portable 1 View  07/29/2014   CLINICAL DATA:  Intermittent episodes of palpitations and tachycardia since 1400 today. Symptoms feel similar to passed episodes of atrial fibrillation.  EXAM: PORTABLE CHEST - 1 VIEW  COMPARISON:  06/06/2014  FINDINGS: Postoperative changes in the mediastinum. Cardiac pacemaker. Shallow inspiration. Cardiac enlargement with mild pulmonary vascular congestion similar to prior study. No evidence of edema or consolidation. No blunting of costophrenic angles. No pneumothorax.  IMPRESSION: Cardiac enlargement with mild vascular congestion. No edema or consolidation.   Electronically Signed   By: Lucienne Capers M.D.   On:  07/29/2014 23:52     EKG Interpretation   Date/Time:  Saturday Jul 29 2014 22:38:37 EDT Ventricular Rate:  132 PR Interval:    QRS Duration: 96 QT Interval:  340 QTC Calculation: 503 R Axis:   63 Text Interpretation:  Atrial fibrillation with rapid ventricular response  with premature ventricular or aberrantly conducted complexes Incomplete  right bundle branch block Nonspecific ST and T wave abnormality Abnormal  ECG No in atrial fibrillation when compared to prior Confirmed by Kjerstin Abrigo   MD, [place] ([id number]) on 07/29/2014 10:53:59 PM      MDM   Final diagnoses:  Paroxysmal atrial fibrillation  UTI (lower urinary tract infection)    Patient presents with A. fib with RVR. Palpitations without chest pain. Reports shortness of breath. Currently in atrial fibrillation with a rate of 130s. After I left the room, patient spontaneously converted to normal sinus rhythm. Pacemaker interrogated to show the patient was in atrial fibrillation since 2 PM this afternoon.  Otherwise, patient's pacer appears to be functioning incorrectly.  Troponin and EKG otherwise reassuring. Chest x-ray does suggest mild pulmonary vascular congestion. Patient is on daily Lasix. Patient given one IV dose of Lasix given her shortness of breath. She does not overtly appear volume overloaded. Urinalysis still appears infected and is positive for nitrites.  Culture sent and patient given IV Rocephin. Will continue Septra until culture results obtained. Will have patient follow up closely with cardiology.  After history, exam, and medical workup I feel the patient has been appropriately medically screened and is safe for discharge home. Pertinent diagnoses were discussed with the patient. Patient was given return precautions.   I personally performed the services described in this documentation, which was scribed in my presence. The recorded information has been reviewed and is accurate.    Dana Hacker,  MD 07/30/14 [phone]

## 2014-07-30 LAB — URINALYSIS, ROUTINE W REFLEX MICROSCOPIC
Glucose, UA: NEGATIVE mg/dL
KETONES UR: 15 mg/dL — AB
NITRITE: POSITIVE — AB
PROTEIN: 100 mg/dL — AB
SPECIFIC GRAVITY, URINE: 1.022 (ref 1.005–1.030)
Urobilinogen, UA: 1 mg/dL (ref 0.0–1.0)
pH: 6 (ref 5.0–8.0)

## 2014-07-30 LAB — URINE MICROSCOPIC-ADD ON

## 2014-07-30 MED ORDER — FUROSEMIDE 10 MG/ML IJ SOLN
40.0000 mg | Freq: Once | INTRAMUSCULAR | Status: AC
  Administered 2014-07-30: 40 mg via INTRAVENOUS
  Filled 2014-07-30: qty 4

## 2014-07-30 MED ORDER — DEXTROSE 5 % IV SOLN
1.0000 g | Freq: Once | INTRAVENOUS | Status: AC
  Administered 2014-07-30: 1 g via INTRAVENOUS
  Filled 2014-07-30: qty 10

## 2014-07-30 NOTE — Telephone Encounter (Signed)
thx

## 2014-07-30 NOTE — ED Notes (Signed)
Pt self converted to paced rhythm, EKG shot and given to Dr. Dina Rich.

## 2014-07-30 NOTE — Discharge Instructions (Signed)
You were seen today for atrial fibrillation. Her lab work is reassuring and your heart rate spontaneously changed back to sinus rhythm. You do have evidence of urinary tract infection. You can continue your Septra at home. You were given one dose of Rocephin. See return precautions below.  Urinary Tract Infection Urinary tract infections (UTIs) can develop anywhere along your urinary tract. Your urinary tract is your body's drainage system for removing wastes and extra water. Your urinary tract includes two kidneys, two ureters, a bladder, and a urethra. Your kidneys are a pair of bean-shaped organs. Each kidney is about the size of your fist. They are located below your ribs, one on each side of your spine. CAUSES Infections are caused by microbes, which are microscopic organisms, including fungi, viruses, and bacteria. These organisms are so small that they can only be seen through a microscope. Bacteria are the microbes that most commonly cause UTIs. SYMPTOMS  Symptoms of UTIs may vary by age and gender of the patient and by the location of the infection. Symptoms in young women typically include a frequent and intense urge to urinate and a painful, burning feeling in the bladder or urethra during urination. Older women and men are more likely to be tired, shaky, and weak and have muscle aches and abdominal pain. A fever may mean the infection is in your kidneys. Other symptoms of a kidney infection include pain in your back or sides below the ribs, nausea, and vomiting. DIAGNOSIS To diagnose a UTI, your caregiver will ask you about your symptoms. Your caregiver also will ask to provide a urine sample. The urine sample will be tested for bacteria and white blood cells. White blood cells are made by your body to help fight infection. TREATMENT  Typically, UTIs can be treated with medication. Because most UTIs are caused by a bacterial infection, they usually can be treated with the use of antibiotics.  The choice of antibiotic and length of treatment depend on your symptoms and the type of bacteria causing your infection. HOME CARE INSTRUCTIONS  If you were prescribed antibiotics, take them exactly as your caregiver instructs you. Finish the medication even if you feel better after you have only taken some of the medication.  Drink enough water and fluids to keep your urine clear or pale yellow.  Avoid caffeine, tea, and carbonated beverages. They tend to irritate your bladder.  Empty your bladder often. Avoid holding urine for long periods of time.  Empty your bladder before and after sexual intercourse.  After a bowel movement, women should cleanse from front to back. Use each tissue only once. SEEK MEDICAL CARE IF:   You have back pain.  You develop a fever.  Your symptoms do not begin to resolve within 3 days. SEEK IMMEDIATE MEDICAL CARE IF:   You have severe back pain or lower abdominal pain.  You develop chills.  You have nausea or vomiting.  You have continued burning or discomfort with urination. MAKE SURE YOU:   Understand these instructions.  Will watch your condition.  Will get help right away if you are not doing well or get worse. Document Released: 12/11/2004 Document Revised: 09/02/2011 Document Reviewed: 04/11/2011 [facility] Patient Information 2015 [place], [place]. This information is not intended to replace advice given to you by your health care provider. Make sure you discuss any questions you have with your health care provider. Atrial Fibrillation Atrial fibrillation is a type of irregular heart rhythm (arrhythmia). During atrial fibrillation, the upper chambers  of the heart (atria) quiver continuously in a chaotic pattern. This causes an irregular and often rapid heart rate.  Atrial fibrillation is the result of the heart becoming overloaded with disorganized signals that tell it to beat. These signals are normally released one at a time by a part of  the right atrium called the sinoatrial node. They then travel from the atria to the lower chambers of the heart (ventricles), causing the atria and ventricles to contract and pump blood as they pass. In atrial fibrillation, parts of the atria outside of the sinoatrial node also release these signals. This results in two problems. First, the atria receive so many signals that they do not have time to fully contract. Second, the ventricles, which can only receive one signal at a time, beat irregularly and out of rhythm with the atria.  There are three types of atrial fibrillation:   Paroxysmal. Paroxysmal atrial fibrillation starts suddenly and stops on its own within a week.  Persistent. Persistent atrial fibrillation lasts for more than a week. It may stop on its own or with treatment.  Permanent. Permanent atrial fibrillation does not go away. Episodes of atrial fibrillation may lead to permanent atrial fibrillation. Atrial fibrillation can prevent your heart from pumping blood normally. It increases your risk of stroke and can lead to heart failure.  CAUSES   Heart conditions, including a heart attack, heart failure, coronary artery disease, and heart valve conditions.   Inflammation of the sac that surrounds the heart (pericarditis).  Blockage of an artery in the lungs (pulmonary embolism).  Pneumonia or other infections.  Chronic lung disease.  Thyroid problems, especially if the thyroid is overactive (hyperthyroidism).  Caffeine, excessive alcohol use, and use of some illegal drugs.   Use of some medicines, including certain decongestants and diet pills.  Heart surgery.   Birth defects.  Sometimes, no cause can be found. When this happens, the atrial fibrillation is called lone atrial fibrillation. The risk of complications from atrial fibrillation increases if you have lone atrial fibrillation and you are age 13 years or older. RISK FACTORS  Heart failure.  Coronary  artery disease.  Diabetes mellitus.   High blood pressure (hypertension).   Obesity.   Other arrhythmias.   Increased age. SIGNS AND SYMPTOMS   A feeling that your heart is beating rapidly or irregularly.   A feeling of discomfort or pain in your chest.   Shortness of breath.   Sudden light-headedness or weakness.   Getting tired easily when exercising.   Urinating more often than normal (mainly when atrial fibrillation first begins).  In paroxysmal atrial fibrillation, symptoms may start and suddenly stop. DIAGNOSIS  Your health care provider may be able to detect atrial fibrillation when taking your pulse. Your health care provider may have you take a test called an ambulatory electrocardiogram (ECG). An ECG records your heartbeat patterns over a 24-hour period. You may also have other tests, such as:  Transthoracic echocardiogram (TTE). During echocardiography, sound waves are used to evaluate how blood flows through your heart.  Transesophageal echocardiogram (TEE).  Stress test. There is more than one type of stress test. If a stress test is needed, ask your health care provider about which type is best for you.  Chest X-ray exam.  Blood tests.  Computed tomography (CT). TREATMENT  Treatment may include:  Treating any underlying conditions. For example, if you have an overactive thyroid, treating the condition may correct atrial fibrillation.  Taking medicine. Medicines may be  given to control a rapid heart rate or to prevent blood clots, heart failure, or a stroke.  Having a procedure to correct the rhythm of the heart:  Electrical cardioversion. During electrical cardioversion, a controlled, low-energy shock is delivered to the heart through your skin. If you have chest pain, very low blood pressure, or sudden heart failure, this procedure may need to be done as an emergency.  Catheter ablation. During this procedure, heart tissues that send the  signals that cause atrial fibrillation are destroyed.  Surgical ablation. During this surgery, thin lines of heart tissue that carry the abnormal signals are destroyed. This procedure can either be an open-heart surgery or a minimally invasive surgery. With the minimally invasive surgery, small cuts are made to access the heart instead of a large opening.  Pulmonary venous isolation. During this surgery, tissue around the veins that carry blood from the lungs (pulmonary veins) is destroyed. This tissue is thought to carry the abnormal signals. HOME CARE INSTRUCTIONS   Take medicines only as directed by your health care provider. Some medicines can make atrial fibrillation worse or recur.  If blood thinners were prescribed by your health care provider, take them exactly as directed. Too much blood-thinning medicine can cause bleeding. If you take too little, you will not have the needed protection against stroke and other problems.  Perform blood tests at home if directed by your health care provider. Perform blood tests exactly as directed.  Quit smoking if you smoke.  Do not drink alcohol.  Do not drink caffeinated beverages such as coffee, soda, and some teas. You may drink decaffeinated coffee, soda, or tea.   Maintain a healthy weight.Do not use diet pills unless your health care provider approves. They may make heart problems worse.   Follow diet instructions as directed by your health care provider.  Exercise regularly as directed by your health care provider.  Keep all follow-up visits as directed by your health care provider. This is important. PREVENTION  The following substances can cause atrial fibrillation to recur:   Caffeinated beverages.  Alcohol.  Certain medicines, especially those used for breathing problems.  Certain herbs and herbal medicines, such as those containing ephedra or ginseng.  Illegal drugs, such as cocaine and amphetamines. Sometimes medicines  are given to prevent atrial fibrillation from recurring. Proper treatment of any underlying condition is also important in helping prevent recurrence.  SEEK MEDICAL CARE IF:  You notice a change in the rate, rhythm, or strength of your heartbeat.  You suddenly begin urinating more frequently.  You tire more easily when exerting yourself or exercising. SEEK IMMEDIATE MEDICAL CARE IF:   You have chest pain, abdominal pain, sweating, or weakness.  You feel nauseous.  You have shortness of breath.  You suddenly have swollen feet and ankles.  You feel dizzy.  Your face or limbs feel numb or weak.  You have a change in your vision or speech. MAKE SURE YOU:   Understand these instructions.  Will watch your condition.  Will get help right away if you are not doing well or get worse. Document Released: 03/03/2005 Document Revised: 07/18/2013 Document Reviewed: 04/13/2012 [facility] Patient Information 2015 [place], [place]. This information is not intended to replace advice given to you by your health care provider. Make sure you discuss any questions you have with your health care provider.

## 2014-07-31 LAB — URINE CULTURE

## 2014-08-03 ENCOUNTER — Other Ambulatory Visit: Payer: Self-pay | Admitting: *Deleted

## 2014-08-03 MED ORDER — LOSARTAN POTASSIUM 50 MG PO TABS
50.0000 mg | ORAL_TABLET | Freq: Every day | ORAL | Status: DC
Start: 2014-08-03 — End: 2015-04-03

## 2014-08-03 NOTE — Telephone Encounter (Signed)
Rx(s) sent to pharmacy electronically.

## 2014-09-01 ENCOUNTER — Other Ambulatory Visit: Payer: Self-pay

## 2014-09-05 ENCOUNTER — Ambulatory Visit (INDEPENDENT_AMBULATORY_CARE_PROVIDER_SITE_OTHER): Payer: Medicare Other | Admitting: Internal Medicine

## 2014-09-05 ENCOUNTER — Other Ambulatory Visit: Payer: Self-pay

## 2014-09-05 ENCOUNTER — Encounter: Payer: Self-pay | Admitting: Internal Medicine

## 2014-09-05 DIAGNOSIS — I495 Sick sinus syndrome: Secondary | ICD-10-CM | POA: Diagnosis not present

## 2014-09-05 DIAGNOSIS — I480 Paroxysmal atrial fibrillation: Secondary | ICD-10-CM | POA: Diagnosis not present

## 2014-09-05 DIAGNOSIS — Z01810 Encounter for preprocedural cardiovascular examination: Secondary | ICD-10-CM

## 2014-09-05 LAB — CUP PACEART INCLINIC DEVICE CHECK
Battery Remaining Longevity: 136 mo
Battery Voltage: 3.06 V
Brady Statistic AP VP Percent: 0.32 %
Brady Statistic AS VS Percent: 27.56 %
Brady Statistic RA Percent Paced: 72.43 %
Brady Statistic RV Percent Paced: 0.33 %
Lead Channel Impedance Value: 399 Ohm
Lead Channel Impedance Value: 532 Ohm
Lead Channel Impedance Value: 532 Ohm
Lead Channel Pacing Threshold Amplitude: 0.75 V
Lead Channel Pacing Threshold Pulse Width: 0.4 ms
Lead Channel Pacing Threshold Pulse Width: 0.4 ms
Lead Channel Sensing Intrinsic Amplitude: 17.25 mV
Lead Channel Sensing Intrinsic Amplitude: 2 mV
Lead Channel Setting Pacing Amplitude: 2.5 V
Lead Channel Setting Sensing Sensitivity: 0.9 mV
MDC IDC MSMT LEADCHNL RV IMPEDANCE VALUE: 646 Ohm
MDC IDC MSMT LEADCHNL RV PACING THRESHOLD AMPLITUDE: 0.5 V
MDC IDC SESS DTM: 20160621103609
MDC IDC SET LEADCHNL RA PACING AMPLITUDE: 2 V
MDC IDC SET LEADCHNL RV PACING PULSEWIDTH: 0.4 ms
MDC IDC STAT BRADY AP VS PERCENT: 72.11 %
MDC IDC STAT BRADY AS VP PERCENT: 0.01 %
Zone Setting Detection Interval: 400 ms
Zone Setting Detection Interval: 400 ms

## 2014-09-05 NOTE — Progress Notes (Signed)
HPI [name] returns today for followup. She is a pleasant 69 yo woman with a h/o PAF and long postermination pauses, s/p PPM insertion. She also had HTN. In the interim, she has developed kidney stones and is pending lithotripsy.   No Known Allergies   Current Outpatient Prescriptions  Medication Sig Dispense Refill  . apixaban (ELIQUIS) 5 MG TABS tablet Take 1 tablet (5 mg total) by mouth 2 (two) times daily. Take 1st dose 06-07-14 60 tablet 1  . B Complex-Biotin-FA (B-COMPLEX PO) Take 1 capsule by mouth daily.     . Cholecalciferol (VITAMIN D3) 5000 UNITS CAPS Take 1 capsule by mouth daily.    [name] diltiazem (CARDIZEM CD) 120 MG 24 hr capsule Take 1 capsule (120 mg total) by mouth daily. 30 capsule 1  . fenofibrate 160 MG tablet TAKE 1 TABLET DAILY (Patient taking differently: TAKE 1 TABLET BY MOUTH DAILY) 30 tablet 6  . furosemide (LASIX) 40 MG tablet Take 40 mg by mouth daily. Can take 1 extra tablet by mouth daily as needed for swelling/edema    . gabapentin (NEURONTIN) 300 MG capsule Take 1 capsule by mouth at bedtime.    . insulin aspart protamine- aspart (NOVOLOG MIX 70/30) (70-30) 100 UNIT/ML injection Inject 25-30 Units into the skin 2 (two) times daily with a meal.     . losartan (COZAAR) 50 MG tablet Take 1 tablet (50 mg total) by mouth daily. 90 tablet 0  . metFORMIN (GLUCOPHAGE) 500 MG tablet Take 500 mg by mouth 2 (two) times daily with a meal.     . metoprolol succinate (TOPROL-XL) 50 MG 24 hr tablet Take 1.5 tablets (74m) by mouth once daily 45 tablet 11  . NEOMYCIN-POLYMYXIN-HYDROCORTISONE (CORTISPORIN) 1 % SOLN otic solution Place 4 drops into both ears 2 (two) times daily as needed. For pain and itching    . potassium chloride (K-DUR,KLOR-CON) 10 MEQ tablet Take 10 mEq by mouth daily.     No current facility-administered medications for this visit.     Past Medical History  Diagnosis Date  . Hyperlipidemia   . Uterine cancer     age [age]with partial  hysterectomy  . TIA (transient ischemic attack)   . Paroxysmal atrial fibrillation     a. s/p Maze in 2005 b. recurrence in March 2016 with >8sec posttermination pauses s/p PPM implant    . Mixed hyperlipidemia   . Coronary atherosclerosis of native coronary artery     a. s/p CABG 2005 Dr ORoxy Manns   . Hypertensive heart disease     ROS:   All systems reviewed and negative except as noted in the HPI.   Past Surgical History  Procedure Laterality Date  . Abdominal hysterectomy    . Coronary artery bypass graft  2005    Dr ORoxy Mannswith MAZE  . Cholecystectomy    . 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 Hypertension Mother   . Hyperlipidemia Mother   . Heart disease Mother   . Diabetes Father   . Stroke Father   . Heart disease Father   . Hypertension Sister     3 sisters  . Arthritis Brother   . Heart attack Maternal Grandfather   . Stroke Paternal Grandfather   . Hypertension Daughter      History  Social History  . Marital Status: Married    Spouse Name: N/A  . Number of Children: N/A  . Years of Education: N/A   Occupational History  . Not on file.   Social History Main Topics  . Smoking status: Never Smoker   . Smokeless tobacco: Never Used  . Alcohol Use: No  . Drug Use: Not on file  . Sexual Activity: Not on file   Other Topics Concern  . Not on file   Social History Narrative     BP 152/70 mmHg  Pulse 64  Ht 5' 4" (1.626 m)  Wt 240 lb (108.863 kg)  BMI 41.18 kg/m2  Physical Exam:  Well appearing NAD HEENT: Unremarkable Neck:  No JVD, no thyromegally Lymphatics:  No adenopathy Back:  No CVA tenderness Lungs:  Clear with no wheezes HEART:  Regular rate rhythm, no murmurs, no rubs, no clicks Abd:  soft, positive bowel sounds, no organomegally, no rebound, no guarding Ext:  2 plus pulses, no edema,  no cyanosis, no clubbing Skin:  No rashes no nodules Neuro:  CN II through XII intact, motor grossly intact   DEVICE  Normal device function.  See PaceArt for details.   Assess/Plan:

## 2014-09-05 NOTE — Assessment & Plan Note (Signed)
She is maintaining NSR 99% of the time. She will continue her current meds but as her bp is up, will uptitrate cardizem to 240 daily.

## 2014-09-05 NOTE — Assessment & Plan Note (Signed)
She is encouraged to lose weight. Will follow.

## 2014-09-05 NOTE — Assessment & Plan Note (Signed)
She is low risk for cardiac complications from pending urologic surgery. She may stop her anti-coagulation 2 days prior to the procedure. Anti-coagulation may be restarted as soon as it is thought to be safe from a bleeding perspective.

## 2014-09-05 NOTE — Patient Instructions (Addendum)
Medication Instructions:  Your physician has recommended you make the following change in your medication:  1) Increase Diltiazem to 240 mg daily    Labwork: None ordered  Testing/Procedures: None ordered  Follow-Up: Your physician wants you to follow-up in: March with Dr Knox Saliva will receive a reminder letter in the mail two months in advance. If you don't receive a letter, please call our office to schedule the follow-up appointment.  Remote monitoring is used to monitor your Pacemaker or ICD from home. This monitoring reduces the number of office visits required to check your device to one time per year. It allows [place] to keep an eye on the functioning of your device to ensure it is working properly. You are scheduled for a device check from home on 12/05/14. You may send your transmission at any time that day. If you have a wireless device, the transmission will be sent automatically. After your physician reviews your transmission, you will receive a postcard with your next transmission date.    Any Other Special Instructions Will Be Listed Below (If Applicable).

## 2014-09-05 NOTE — Assessment & Plan Note (Signed)
She denies anginal symptoms. Will follow.

## 2014-09-06 ENCOUNTER — Other Ambulatory Visit: Payer: Self-pay

## 2014-09-06 MED ORDER — DILTIAZEM HCL ER COATED BEADS 240 MG PO CP24: 240.0000 mg | ORAL_CAPSULE | Freq: Every day | ORAL | Status: DC

## 2014-09-07 ENCOUNTER — Telehealth: Payer: Self-pay | Admitting: Internal Medicine

## 2014-09-07 ENCOUNTER — Encounter (HOSPITAL_COMMUNITY): Payer: Self-pay | Admitting: *Deleted

## 2014-09-07 ENCOUNTER — Telehealth: Payer: Self-pay | Admitting: *Deleted

## 2014-09-07 ENCOUNTER — Other Ambulatory Visit: Payer: Self-pay | Admitting: Urology

## 2014-09-07 NOTE — Telephone Encounter (Signed)
Requesting surgical clearance:   1. Type of surgery: LEFT ureteroscopic stone abstraction  2. Surgeon: [facility]                   3. Surgical date: RECEIVED A FAXED INFORMING CLEARANCE NO LONGER NEEDED.  4. Medications that need to be held:                        Special instructions: Note received will be scanned in under Media for review if needed.

## 2014-09-07 NOTE — Progress Notes (Signed)
Called requested release of orders in Epic to sign and held for Same day surgery 09-08-14 Thanks

## 2014-09-07 NOTE — Telephone Encounter (Signed)
New message    Request for surgical clearance:  1. What type of surgery is being performed? Kidney stone   2. When is this surgery scheduled? Pending   3. Are there any medications that need to be held prior to surgery and how long? Eliquis   4. Name of physician performing surgery? Dr. Alyson Ingles   5. What is your office phone and fax number? selita calling from [facility] [phone] / ext [phone]. fax [phone]

## 2014-09-07 NOTE — Telephone Encounter (Signed)
Per Dr. Tanna Furry OV note on 09/05/14:   She is low risk for cardiac complications from pending urologic surgery. She may stop her anti-coagulation 2 days prior to the procedure. Anti-coagulation may be restarted as soon as it is thought to be safe from a bleeding perspective.  Will fax copy to [facility]

## 2014-09-08 ENCOUNTER — Ambulatory Visit (HOSPITAL_COMMUNITY)
Admission: RE | Admit: 2014-09-08 | Discharge: 2014-09-09 | Disposition: A | Discharge status: Home / Self Care | Payer: Medicare Other | Source: Ambulatory Visit | Attending: Urology | Admitting: Urology

## 2014-09-08 ENCOUNTER — Encounter (HOSPITAL_COMMUNITY): Payer: Self-pay | Admitting: General Practice

## 2014-09-08 ENCOUNTER — Ambulatory Visit (HOSPITAL_COMMUNITY): Payer: Medicare Other | Admitting: Certified Registered"

## 2014-09-08 ENCOUNTER — Encounter (HOSPITAL_COMMUNITY)
Admission: RE | Disposition: A | Discharge status: Home / Self Care | Payer: Self-pay | Source: Ambulatory Visit | Attending: Urology

## 2014-09-08 DIAGNOSIS — E782 Mixed hyperlipidemia: Secondary | ICD-10-CM | POA: Diagnosis not present

## 2014-09-08 DIAGNOSIS — Z7901 Long term (current) use of anticoagulants: Secondary | ICD-10-CM | POA: Diagnosis not present

## 2014-09-08 DIAGNOSIS — Z8673 Personal history of transient ischemic attack (TIA), and cerebral infarction without residual deficits: Secondary | ICD-10-CM | POA: Insufficient documentation

## 2014-09-08 DIAGNOSIS — Z951 Presence of aortocoronary bypass graft: Secondary | ICD-10-CM | POA: Diagnosis not present

## 2014-09-08 DIAGNOSIS — E119 Type 2 diabetes mellitus without complications: Secondary | ICD-10-CM | POA: Diagnosis not present

## 2014-09-08 DIAGNOSIS — N200 Calculus of kidney: Secondary | ICD-10-CM | POA: Diagnosis not present

## 2014-09-08 DIAGNOSIS — I480 Paroxysmal atrial fibrillation: Secondary | ICD-10-CM | POA: Insufficient documentation

## 2014-09-08 DIAGNOSIS — Z794 Long term (current) use of insulin: Secondary | ICD-10-CM | POA: Insufficient documentation

## 2014-09-08 DIAGNOSIS — Z79899 Other long term (current) drug therapy: Secondary | ICD-10-CM | POA: Insufficient documentation

## 2014-09-08 DIAGNOSIS — E785 Hyperlipidemia, unspecified: Secondary | ICD-10-CM | POA: Insufficient documentation

## 2014-09-08 DIAGNOSIS — N201 Calculus of ureter: Secondary | ICD-10-CM | POA: Diagnosis present

## 2014-09-08 DIAGNOSIS — I10 Essential (primary) hypertension: Secondary | ICD-10-CM | POA: Insufficient documentation

## 2014-09-08 DIAGNOSIS — I2510 Atherosclerotic heart disease of native coronary artery without angina pectoris: Secondary | ICD-10-CM | POA: Insufficient documentation

## 2014-09-08 DIAGNOSIS — Z950 Presence of cardiac pacemaker: Secondary | ICD-10-CM | POA: Diagnosis not present

## 2014-09-08 HISTORY — PX: HOLMIUM LASER APPLICATION: SHX5852

## 2014-09-08 HISTORY — PX: CYSTOSCOPY WITH URETEROSCOPY, STONE BASKETRY AND STENT PLACEMENT: SHX6378

## 2014-09-08 LAB — BASIC METABOLIC PANEL
ANION GAP: 10 (ref 5–15)
BUN: 17 mg/dL (ref 6–20)
CHLORIDE: 102 mmol/L (ref 101–111)
CO2: 26 mmol/L (ref 22–32)
Calcium: 9.4 mg/dL (ref 8.9–10.3)
Creatinine, Ser: 0.8 mg/dL (ref 0.44–1.00)
GFR calc Af Amer: >60 mL/min (ref >60)
GFR calc non Af Amer: >60 mL/min (ref >60)
Glucose, Bld: 177 mg/dL — ABNORMAL HIGH (ref 65–99)
POTASSIUM: 3.8 mmol/L (ref 3.5–5.1)
SODIUM: 138 mmol/L (ref 135–145)

## 2014-09-08 LAB — GLUCOSE, CAPILLARY
GLUCOSE-CAPILLARY: 132 mg/dL — AB (ref 65–99)
Glucose-Capillary: 156 mg/dL — ABNORMAL HIGH (ref 65–99)
Glucose-Capillary: 169 mg/dL — ABNORMAL HIGH (ref 65–99)

## 2014-09-08 LAB — CBC
HEMATOCRIT: 37.5 % (ref 36.0–46.0)
Hemoglobin: 12.3 g/dL (ref 12.0–15.0)
MCH: 29.5 pg (ref 26.0–34.0)
MCHC: 32.8 g/dL (ref 30.0–36.0)
MCV: 89.9 fL (ref 78.0–100.0)
Platelets: 313 10*3/uL (ref 150–400)
RBC: 4.17 MIL/uL (ref 3.87–5.11)
RDW: 14.1 % (ref 11.5–15.5)
WBC: 9.9 10*3/uL (ref 4.0–10.5)

## 2014-09-08 SURGERY — CYSTOSCOPY, WITH CALCULUS MANIPULATION OR REMOVAL
Anesthesia: General | Site: Ureter | Laterality: Left

## 2014-09-08 MED ORDER — INSULIN ASPART 100 UNIT/ML ~~LOC~~ SOLN
0.0000 [IU] | Freq: Three times a day (TID) | SUBCUTANEOUS | Status: DC
  Administered 2014-09-09: 3 [IU] via SUBCUTANEOUS

## 2014-09-08 MED ORDER — ONDANSETRON HCL 4 MG/2ML IJ SOLN
INTRAMUSCULAR | Status: AC
  Filled 2014-09-08: qty 2

## 2014-09-08 MED ORDER — ZOLPIDEM TARTRATE 5 MG PO TABS: 5.0000 mg | ORAL_TABLET | Freq: Every evening | ORAL | Status: DC | PRN

## 2014-09-08 MED ORDER — ONDANSETRON HCL 4 MG/2ML IJ SOLN
4.0000 mg | INTRAMUSCULAR | Status: DC | PRN
  Filled 2014-09-08: qty 2

## 2014-09-08 MED ORDER — FENTANYL CITRATE (PF) 100 MCG/2ML IJ SOLN
25.0000 ug | INTRAMUSCULAR | Status: AC | PRN
  Administered 2014-09-08 (×6): 25 ug via INTRAVENOUS

## 2014-09-08 MED ORDER — DIPHENHYDRAMINE HCL 50 MG/ML IJ SOLN: 12.5000 mg | Freq: Four times a day (QID) | INTRAMUSCULAR | Status: DC | PRN

## 2014-09-08 MED ORDER — POTASSIUM CHLORIDE CRYS ER 10 MEQ PO TBCR: 10.0000 meq | EXTENDED_RELEASE_TABLET | Freq: Every morning | ORAL | Status: DC

## 2014-09-08 MED ORDER — DIPHENHYDRAMINE HCL 12.5 MG/5ML PO ELIX: 12.5000 mg | ORAL_SOLUTION | Freq: Four times a day (QID) | ORAL | Status: DC | PRN

## 2014-09-08 MED ORDER — LIDOCAINE HCL (CARDIAC) 20 MG/ML IV SOLN
INTRAVENOUS | Status: DC | PRN
  Administered 2014-09-08: 100 mg via INTRAVENOUS

## 2014-09-08 MED ORDER — DILTIAZEM HCL ER COATED BEADS 240 MG PO CP24: 240.0000 mg | ORAL_CAPSULE | Freq: Every evening | ORAL | Status: DC

## 2014-09-08 MED ORDER — LACTATED RINGERS IV SOLN
INTRAVENOUS | Status: DC
Start: 2014-09-08 — End: 2014-09-08
  Administered 2014-09-08: 1000 mL via INTRAVENOUS
  Administered 2014-09-08: 21:00:00 via INTRAVENOUS

## 2014-09-08 MED ORDER — FENTANYL CITRATE (PF) 100 MCG/2ML IJ SOLN
INTRAMUSCULAR | Status: AC
  Administered 2014-09-08: 25 ug via INTRAVENOUS
  Filled 2014-09-08: qty 2

## 2014-09-08 MED ORDER — CEFAZOLIN SODIUM-DEXTROSE 2-3 GM-% IV SOLR
2.0000 g | INTRAVENOUS | Status: AC
  Administered 2014-09-08: 2 g via INTRAVENOUS

## 2014-09-08 MED ORDER — ONDANSETRON HCL 4 MG/2ML IJ SOLN
INTRAMUSCULAR | Status: DC | PRN
  Administered 2014-09-08: 4 mg via INTRAVENOUS

## 2014-09-08 MED ORDER — CEFAZOLIN SODIUM-DEXTROSE 2-3 GM-% IV SOLR
INTRAVENOUS | Status: AC
  Filled 2014-09-08: qty 50

## 2014-09-08 MED ORDER — OXYCODONE-ACETAMINOPHEN 5-325 MG PO TABS: 1.0000 | ORAL_TABLET | ORAL | Status: DC | PRN

## 2014-09-08 MED ORDER — LIDOCAINE HCL (CARDIAC) 20 MG/ML IV SOLN
INTRAVENOUS | Status: AC
Start: 2014-09-08 — End: 2014-09-08
  Filled 2014-09-08: qty 5

## 2014-09-08 MED ORDER — MEPERIDINE HCL 50 MG/ML IJ SOLN
6.2500 mg | INTRAMUSCULAR | Status: DC | PRN
  Administered 2014-09-08: 6.25 mg via INTRAVENOUS

## 2014-09-08 MED ORDER — SENNOSIDES-DOCUSATE SODIUM 8.6-50 MG PO TABS
2.0000 | ORAL_TABLET | Freq: Every day | ORAL | Status: DC
  Administered 2014-09-08: 2 via ORAL
  Filled 2014-09-08: qty 2

## 2014-09-08 MED ORDER — OXYCODONE-ACETAMINOPHEN 5-325 MG PO TABS
1.0000 | ORAL_TABLET | ORAL | Status: DC | PRN
  Administered 2014-09-08 – 2014-09-09 (×2): 1 via ORAL
  Filled 2014-09-08: qty 2
  Filled 2014-09-08: qty 1

## 2014-09-08 MED ORDER — TAMSULOSIN HCL 0.4 MG PO CAPS: 0.4000 mg | ORAL_CAPSULE | Freq: Every day | ORAL | Status: DC

## 2014-09-08 MED ORDER — PROMETHAZINE HCL 25 MG/ML IJ SOLN
6.2500 mg | INTRAMUSCULAR | Status: AC | PRN
Start: 2014-09-08 — End: 2014-09-08
  Administered 2014-09-08 (×2): 6.25 mg via INTRAVENOUS

## 2014-09-08 MED ORDER — OXYCODONE-ACETAMINOPHEN 5-325 MG PO TABS
1.0000 | ORAL_TABLET | Freq: Once | ORAL | Status: AC
  Administered 2014-09-08: 1 via ORAL

## 2014-09-08 MED ORDER — FENTANYL CITRATE (PF) 100 MCG/2ML IJ SOLN
INTRAMUSCULAR | Status: DC | PRN
  Administered 2014-09-08 (×2): 50 ug via INTRAVENOUS

## 2014-09-08 MED ORDER — FENOFIBRATE 160 MG PO TABS
160.0000 mg | ORAL_TABLET | Freq: Every day | ORAL | Status: DC
  Filled 2014-09-08: qty 1

## 2014-09-08 MED ORDER — ACETAMINOPHEN 325 MG PO TABS: 650.0000 mg | ORAL_TABLET | ORAL | Status: DC | PRN

## 2014-09-08 MED ORDER — PHENYLEPHRINE HCL 10 MG/ML IJ SOLN
INTRAMUSCULAR | Status: DC | PRN
  Administered 2014-09-08 (×2): 40 ug via INTRAVENOUS

## 2014-09-08 MED ORDER — PROPOFOL 10 MG/ML IV BOLUS
INTRAVENOUS | Status: AC
  Filled 2014-09-08: qty 20

## 2014-09-08 MED ORDER — OXYBUTYNIN CHLORIDE ER 10 MG PO TB24
10.0000 mg | ORAL_TABLET | Freq: Every day | ORAL | Status: DC
  Administered 2014-09-08: 10 mg via ORAL
  Filled 2014-09-08 (×2): qty 1

## 2014-09-08 MED ORDER — MEPERIDINE HCL 50 MG/ML IJ SOLN
INTRAMUSCULAR | Status: AC
  Filled 2014-09-08: qty 1

## 2014-09-08 MED ORDER — HYOSCYAMINE SULFATE 0.125 MG SL SUBL
0.1250 mg | SUBLINGUAL_TABLET | SUBLINGUAL | Status: DC | PRN
  Filled 2014-09-08: qty 1

## 2014-09-08 MED ORDER — HYDROMORPHONE HCL 1 MG/ML IJ SOLN
0.5000 mg | INTRAMUSCULAR | Status: DC | PRN
  Filled 2014-09-08: qty 1

## 2014-09-08 MED ORDER — PROPOFOL 10 MG/ML IV BOLUS
INTRAVENOUS | Status: DC | PRN
Start: 2014-09-08 — End: 2014-09-08
  Administered 2014-09-08: 170 mg via INTRAVENOUS

## 2014-09-08 MED ORDER — FENTANYL CITRATE (PF) 100 MCG/2ML IJ SOLN
INTRAMUSCULAR | Status: AC
Start: 2014-09-08 — End: 2014-09-08
  Administered 2014-09-08: 25 ug via INTRAVENOUS
  Filled 2014-09-08: qty 2

## 2014-09-08 MED ORDER — IOHEXOL 300 MG/ML  SOLN
INTRAMUSCULAR | Status: DC | PRN
  Administered 2014-09-08: 10 mL via INTRAVENOUS

## 2014-09-08 MED ORDER — LOSARTAN POTASSIUM 50 MG PO TABS: 50.0000 mg | ORAL_TABLET | Freq: Every day | ORAL | Status: DC

## 2014-09-08 MED ORDER — KETOROLAC TROMETHAMINE 15 MG/ML IJ SOLN
15.0000 mg | Freq: Four times a day (QID) | INTRAMUSCULAR | Status: DC
  Administered 2014-09-08 – 2014-09-09 (×2): 15 mg via INTRAVENOUS
  Filled 2014-09-08 (×2): qty 1

## 2014-09-08 MED ORDER — GABAPENTIN 300 MG PO CAPS
300.0000 mg | ORAL_CAPSULE | Freq: Every day | ORAL | Status: DC
  Administered 2014-09-08: 300 mg via ORAL
  Filled 2014-09-08: qty 1

## 2014-09-08 MED ORDER — PROMETHAZINE HCL 25 MG/ML IJ SOLN
INTRAMUSCULAR | Status: AC
  Administered 2014-09-08: 6.25 mg via INTRAVENOUS
  Filled 2014-09-08: qty 1

## 2014-09-08 MED ORDER — OXYCODONE-ACETAMINOPHEN 5-325 MG PO TABS
ORAL_TABLET | ORAL | Status: AC
  Administered 2014-09-08: 1 via ORAL
  Filled 2014-09-08: qty 1

## 2014-09-08 MED ORDER — FUROSEMIDE 40 MG PO TABS: 40.0000 mg | ORAL_TABLET | Freq: Every day | ORAL | Status: DC

## 2014-09-08 MED ORDER — METOPROLOL SUCCINATE ER 50 MG PO TB24: 75.0000 mg | ORAL_TABLET | Freq: Every day | ORAL | Status: DC

## 2014-09-08 MED ORDER — FENTANYL CITRATE (PF) 100 MCG/2ML IJ SOLN
INTRAMUSCULAR | Status: AC
  Filled 2014-09-08: qty 2

## 2014-09-08 SURGICAL SUPPLY — 20 items
BAG URO CATCHER STRL LF (DRAPE) ×3 IMPLANT
BASKET ZERO TIP NITINOL 2.4FR (BASKET) IMPLANT
CATH INTERMIT  6FR 70CM (CATHETERS) IMPLANT
CLOTH BEACON ORANGE TIMEOUT ST (SAFETY) ×3 IMPLANT
EXTRACTOR STONE NITINOL NGAGE (UROLOGICAL SUPPLIES) ×3 IMPLANT
FIBER LASER FLEXIVA 200 (UROLOGICAL SUPPLIES) ×3 IMPLANT
FIBER LASER FLEXIVA 365 (UROLOGICAL SUPPLIES) IMPLANT
FIBER LASER TRAC TIP (UROLOGICAL SUPPLIES) IMPLANT
GLOVE BIOGEL M STRL SZ7.5 (GLOVE) ×3 IMPLANT
GOWN STRL REUS W/TWL LRG LVL3 (GOWN DISPOSABLE) ×6 IMPLANT
GUIDEWIRE ANG ZIPWIRE 038X150 (WIRE) IMPLANT
GUIDEWIRE STR DUAL SENSOR (WIRE) ×3 IMPLANT
IV NS 1000ML (IV SOLUTION) ×2
IV NS 1000ML BAXH (IV SOLUTION) ×1 IMPLANT
MANIFOLD NEPTUNE II (INSTRUMENTS) ×3 IMPLANT
PACK CYSTO (CUSTOM PROCEDURE TRAY) ×3 IMPLANT
SHEATH ACCESS URETERAL 38CM (SHEATH) ×3 IMPLANT
STENT CONTOUR 6FRX26X.038 (STENTS) ×3 IMPLANT
TUBING CONNECTING 10 (TUBING) ×2 IMPLANT
TUBING CONNECTING 10' (TUBING) ×1

## 2014-09-08 NOTE — Anesthesia Procedure Notes (Signed)
Procedure Name: LMA Insertion Date/Time: 09/08/2014 5:22 PM Performed by: Montel Clock Pre-anesthesia Checklist: Patient identified, Emergency Drugs available, Suction available, Patient being monitored and Timeout performed Patient Re-evaluated:Patient Re-evaluated prior to inductionOxygen Delivery Method: Circle system utilized Preoxygenation: Pre-oxygenation with 100% oxygen Intubation Type: IV induction Ventilation: Mask ventilation without difficulty LMA: LMA with gastric port inserted LMA Size: 3.0 Number of attempts: 1 Dental Injury: Teeth and Oropharynx as per pre-operative assessment

## 2014-09-08 NOTE — Progress Notes (Signed)
After ambulation to bathroom, pt nauseous and c/o increased back pain now on left side. Pt placed back in bed, IVF restarted. Pt medicated for nausea and shivering. Resolution of shivers and nausea easing. Pain persists, medicated for pain. Nasal cannula restarted for oxygen sats 89-90%. Family in at bedside. Pt resting, will continue to monitor.

## 2014-09-08 NOTE — Anesthesia Preprocedure Evaluation (Signed)
Anesthesia Evaluation  Patient identified by MRN, date of birth, ID band Patient awake    Reviewed: Allergy & Precautions, NPO status , Patient's Chart, lab work & pertinent test results  Airway Mallampati: II  TM Distance: >3 FB Neck ROM: Full    Dental no notable dental hx.    Pulmonary neg pulmonary ROS,  breath sounds clear to auscultation  Pulmonary exam normal       Cardiovascular hypertension, Pt. on medications Normal cardiovascular exam+ dysrhythmias Atrial Fibrillation + pacemaker Rhythm:Regular Rate:Normal     Neuro/Psych TIAnegative neurological ROS  negative psych ROS   GI/Hepatic negative GI ROS, Neg liver ROS,   Endo/Other  diabetes, Type 2, Oral Hypoglycemic Agents, Insulin Dependent  Renal/GU negative Renal ROS  negative genitourinary   Musculoskeletal negative musculoskeletal ROS (+)   Abdominal   Peds negative pediatric ROS (+)  Hematology negative hematology ROS (+)   Anesthesia Other Findings   Reproductive/Obstetrics negative OB ROS                             Anesthesia Physical Anesthesia Plan  ASA: III  Anesthesia Plan: General   Post-op Pain Management:    Induction: Intravenous  Airway Management Planned: LMA  Additional Equipment:   Intra-op Plan:   Post-operative Plan:   Informed Consent: I have reviewed the patients History and Physical, chart, labs and discussed the procedure including the risks, benefits and alternatives for the proposed anesthesia with the patient or authorized representative who has indicated his/her understanding and acceptance.   Dental advisory given  Plan Discussed with: CRNA  Anesthesia Plan Comments:         Anesthesia Quick Evaluation

## 2014-09-08 NOTE — H&P (Signed)
Urology Admission H&P  Chief Complaint: left flank pain  History of Present Illness: [name] is a 69yo with a known left ureteral stone here for ureteroscopic stone extraction. She currently has sharp, intermittent, nonradiating flank pain. She denies fevers/chills/sweats. She had episodes of gross hematuria.  Past Medical History  Diagnosis Date  . Hyperlipidemia   . TIA (transient ischemic attack)   . Paroxysmal atrial fibrillation     a. s/p Maze in 2005 b. recurrence in March 2016 with >8sec posttermination pauses s/p PPM implant    . Mixed hyperlipidemia   . Coronary atherosclerosis of native coronary artery     a. s/p CABG 2005 Dr Roxy Manns    . Hypertensive heart disease   . Hypertension   . Uterine cancer     age 27 with partial hysterectomy   Past Surgical History  Procedure Laterality Date  . Abdominal hysterectomy    . Coronary artery bypass graft  2005    Dr Roxy Manns with MAZE  . Cholecystectomy    . Permanent pacemaker insertion N/A 06/05/2014    MDT Advisa 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    Home Medications:  Prescriptions prior to admission  Medication Sig Dispense Refill Last Dose  . acetaminophen (TYLENOL) 500 MG tablet Take 500-1,000 mg by mouth every 6 (six) hours as needed for moderate pain.   Past Week at Unknown time  . apixaban (ELIQUIS) 5 MG TABS tablet Take 1 tablet (5 mg total) by mouth 2 (two) times daily. Take 1st dose 06-07-14 60 tablet 1 09/07/2014 at 0900  . B Complex-Biotin-FA (B-COMPLEX PO) Take 1 capsule by mouth every morning.    09/07/2014 at Unknown time  . Cholecalciferol (VITAMIN D3) 5000 UNITS CAPS Take 1 capsule by mouth every morning.    09/07/2014 at Unknown time  . diltiazem (CARDIZEM CD) 240 MG 24 hr capsule Take 1 capsule (240 mg total) by mouth daily. (Patient taking differently: Take 240 mg by mouth every evening. ) 30 capsule 6 09/07/2014 at Unknown time  . fenofibrate 160 MG tablet TAKE 1 TABLET DAILY  (Patient taking differently: TAKE 1 TABLET BY MOUTH DAILY) 30 tablet 6 09/08/2014 at 0900  . furosemide (LASIX) 40 MG tablet Take 40 mg by mouth daily. Can take 1 extra tablet by mouth daily as needed for swelling/edema   09/07/2014 at Unknown time  . gabapentin (NEURONTIN) 300 MG capsule Take 1 capsule by mouth at bedtime.   09/07/2014 at Unknown time  . insulin aspart protamine- aspart (NOVOLOG MIX 70/30) (70-30) 100 UNIT/ML injection Inject 25-30 Units into the skin 2 (two) times daily with a meal.    09/07/2014 at Unknown time  . losartan (COZAAR) 50 MG tablet Take 1 tablet (50 mg total) by mouth daily. 90 tablet 0 09/07/2014 at Unknown time  . metFORMIN (GLUCOPHAGE) 500 MG tablet Take 500 mg by mouth 2 (two) times daily with a meal.    09/07/2014 at Unknown time  . metoprolol succinate (TOPROL-XL) 50 MG 24 hr tablet Take 1.5 tablets (71m) by mouth once daily 45 tablet 11 09/08/2014 at 0900  . NEOMYCIN-POLYMYXIN-HYDROCORTISONE (CORTISPORIN) 1 % SOLN otic solution Place 4 drops into both ears 2 (two) times daily as needed. For pain and itching   09/07/2014 at Unknown time  . potassium chloride (K-DUR,KLOR-CON) 10 MEQ tablet Take 10 mEq by mouth every morning.    09/07/2014 at Unknown time   Allergies: No Known Allergies  Family History  Problem Relation  Age of Onset  . Hypertension Mother   . Hyperlipidemia Mother   . Heart disease Mother   . Diabetes Father   . Stroke Father   . Heart disease Father   . Hypertension Sister     3 sisters  . Arthritis Brother   . Heart attack Maternal Grandfather   . Stroke Paternal Grandfather   . Hypertension Daughter    Social History:  reports that she has never smoked. She has never used smokeless tobacco. She reports that she does not drink alcohol or use illicit drugs.  Review of Systems  All other systems reviewed and are negative.   Physical Exam:  Vital signs in last 24 hours: Temp:  [98.1 F (36.7 C)] 98.1 F (36.7 C) (06/24 1414) Pulse  Rate:  [97] 97 (06/24 1414) Resp:  [16] 16 (06/24 1414) BP: (156)/(84) 156/84 mmHg (06/24 1414) SpO2:  [99 %] 99 % (06/24 1414) Weight:  [107.616 kg (237 lb 4 oz)] 107.616 kg (237 lb 4 oz) (06/24 1439) Physical Exam  Constitutional: She is oriented to person, place, and time. She appears well-developed and well-nourished.  HENT:  Head: Normocephalic and atraumatic.  Eyes: EOM are normal. Pupils are equal, round, and reactive to light.  Neck: Normal range of motion. Neck supple.  Cardiovascular: Normal rate and regular rhythm.   Respiratory: Effort normal and breath sounds normal.  GI: Soft. She exhibits no distension. There is no tenderness.  Musculoskeletal: Normal range of motion. She exhibits no edema.  Neurological: She is alert and oriented to person, place, and time.  Skin: Skin is warm and dry.  Psychiatric: She has a normal mood and affect. Her behavior is normal. Judgment and thought content normal.    Laboratory Data:  Results for orders placed or performed during the hospital encounter of 09/08/14 (from the past 24 hour(s))  Glucose, capillary     Status: Abnormal   Collection Time: 09/08/14  2:17 PM  Result Value Ref Range   Glucose-Capillary 156 (H) 65 - 99 mg/dL  CBC     Status: None   Collection Time: 09/08/14  2:35 PM  Result Value Ref Range   WBC 9.9 4.0 - 10.5 K/uL   RBC 4.17 3.87 - 5.11 MIL/uL   Hemoglobin 12.3 12.0 - 15.0 g/dL   HCT 37.5 36.0 - 46.0 %   MCV 89.9 78.0 - 100.0 fL   MCH 29.5 26.0 - 34.0 pg   MCHC 32.8 30.0 - 36.0 g/dL   RDW 14.1 11.5 - 15.5 %   Platelets 313 150 - 400 K/uL  Basic metabolic panel     Status: Abnormal   Collection Time: 09/08/14  2:35 PM  Result Value Ref Range   Sodium 138 135 - 145 mmol/L   Potassium 3.8 3.5 - 5.1 mmol/L   Chloride 102 101 - 111 mmol/L   CO2 26 22 - 32 mmol/L   Glucose, Bld 177 (H) 65 - 99 mg/dL   BUN 17 6 - 20 mg/dL   Creatinine, Ser 0.80 0.44 - 1.00 mg/dL   Calcium 9.4 8.9 - 10.3 mg/dL   GFR calc  non Af Amer >60 >60 mL/min   GFR calc Af Amer >60 >60 mL/min   Anion gap 10 5 - 15   No results found for this or any previous visit (from the past 240 hour(s)). Creatinine:  Recent Labs  09/08/14 1435  CREATININE 0.80    Impression/Assessment:  Left ureteral stone  Plan:  1. Proceed with  left ureteroscopic stone manipulation. Risks/benefits/alternatives to left URS was explained to the patient and she understands and wishes to proceed with surgery.  Havoc Sanluis L 09/08/2014, 5:06 PM

## 2014-09-08 NOTE — Progress Notes (Signed)
Patient and patient's family given update on surgery being delayed.

## 2014-09-08 NOTE — Transfer of Care (Signed)
Immediate Anesthesia Transfer of Care Note  Patient: [name]  Procedure(s) Performed: Procedure(s): CYSTOSCOPY WITH URETEROSCOPY, STONE BASKETRY AND STENT PLACEMENT (Left) HOLMIUM LASER APPLICATION (Left)  Patient Location: PACU  Anesthesia Type:General  Level of Consciousness:  sedated, patient cooperative and responds to stimulation  Airway & Oxygen Therapy:Patient Spontanous Breathing and Patient connected to face mask oxgen  Post-op Assessment:  Report given to PACU RN and Post -op Vital signs reviewed and stable  Post vital signs:  Reviewed and stable  Last Vitals:  Filed Vitals:   09/08/14 1414  BP: 156/84  Pulse: 97  Temp: 36.7 C  Resp: 16    Complications: No apparent anesthesia complications

## 2014-09-08 NOTE — Brief Op Note (Signed)
09/08/2014  6:08 PM  PATIENT:  [name]  69 y.o. female  PRE-OPERATIVE DIAGNOSIS:  LEFT URETERAL STONE  POST-OPERATIVE DIAGNOSIS:  left ureteral stone  PROCEDURE:  Procedure(s): CYSTOSCOPY WITH URETEROSCOPY, STONE BASKETRY AND STENT PLACEMENT (Left) HOLMIUM LASER APPLICATION (Left)  SURGEON:  Surgeon(s) and Role:    * Cleon Gustin, MD - Primary  PHYSICIAN ASSISTANT:   ASSISTANTS: none   ANESTHESIA:   general  EBL:     BLOOD ADMINISTERED:none  DRAINS: 6x26 JJ ureteral stent  LOCAL MEDICATIONS USED:  NONE  SPECIMEN:  Source of Specimen:  left ureteral stone  DISPOSITION OF SPECIMEN:  PATHOLOGY  COUNTS:  YES  TOURNIQUET:  * No tourniquets in log *  DICTATION: .Other Dictation: Dictation Number [id number]  PLAN OF CARE: Discharge to home after PACU  PATIENT DISPOSITION:  PACU - hemodynamically stable.   Delay start of Pharmacological VTE agent (>24hrs) due to surgical blood loss or risk of bleeding: not applicable

## 2014-09-09 DIAGNOSIS — N200 Calculus of kidney: Secondary | ICD-10-CM | POA: Diagnosis not present

## 2014-09-09 LAB — GLUCOSE, CAPILLARY: GLUCOSE-CAPILLARY: 199 mg/dL — AB (ref 65–99)

## 2014-09-09 NOTE — Anesthesia Postprocedure Evaluation (Signed)
  Anesthesia Post-op Note  Patient: [name]  Procedure(s) Performed: Procedure(s) (LRB): CYSTOSCOPY WITH URETEROSCOPY, STONE BASKETRY AND STENT PLACEMENT (Left) HOLMIUM LASER APPLICATION (Left)  Patient Location: PACU  Anesthesia Type: General  Level of Consciousness: awake and alert   Airway and Oxygen Therapy: Patient Spontanous Breathing  Post-op Pain: mild  Post-op Assessment: Post-op Vital signs reviewed, Patient's Cardiovascular Status Stable, Respiratory Function Stable, Patent Airway and No signs of Nausea or vomiting  Last Vitals:  Filed Vitals:   09/09/14 0532  BP: 134/60  Pulse: 61  Temp: 36.6 C  Resp: 20    Post-op Vital Signs: stable   Complications: No apparent anesthesia complications

## 2014-09-09 NOTE — Discharge Instructions (Signed)
1 - You may have urinary urgency (bladder spasms) and bloody urine on / off with stent in place. This is normal.  2 - Call MD or go to ER for fever >102, severe pain / nausea / vomiting not relieved by medications, or acute change in medical status

## 2014-09-09 NOTE — Progress Notes (Signed)
Reviewed discharge information with patient and caregiver. Answered all questions. Patient/caregiver able to teach back medications and reasons to contact MD/911. Patient verbalizes importance of urology and PCP follow up appointment.  [name]. Brigitte Pulse, RN

## 2014-09-09 NOTE — Discharge Summary (Signed)
Physician Discharge Summary  Patient ID: [name] MRN: [id number] DOB/AGE: 1945/05/24 69 y.o.  Admit date: 09/08/2014 Discharge date: 09/09/2014  Admission Diagnoses: Left Ureteral Stone  Discharge Diagnoses:  Active Problems:   Ureteral stone   Discharged Condition: good  Hospital Course:   1 - Left Ureteral Stone - underwent left ureteroscopic stone manipulation with stent placement on 6/24, the day of admission, without acute complications. Observed overnight for post-op pain control. By AM of POD 1, the day of discharge, she is ambulatory, pain controlled with PO meds, voiding w/o complaints, and felt to be adequate for discharge.   Consults: None  Significant Diagnostic Studies: labs: none  Treatments: surgery:  left ureteroscopic stone manipulation with stent placement on 6/24  Discharge Exam: Blood pressure 134/60, pulse 61, temperature 97.8 F (36.6 C), temperature source Oral, resp. rate 20, height _0  (1.676 m), weight 109.09 kg (240 lb 8 oz), SpO2 98 %. General appearance: alert, cooperative, appears stated age and family at bedside Eyes: negative Nose: Nares normal. Septum midline. Mucosa normal. No drainage or sinus tenderness. Neck: supple, symmetrical, trachea midline Back: symmetric, no curvature. ROM normal. No CVA tenderness. Resp: non-labored on room air Cardio: Nl rate GI: soft, non-tender; bowel sounds normal; no masses,  no organomegaly Extremities: extremities normal, atraumatic, no cyanosis or edema Pulses: 2+ and symmetric Skin: Skin color, texture, turgor normal. No rashes or lesions Neurologic: Grossly normal  Disposition: 01-Home or Self Care     Medication List    TAKE these medications        acetaminophen 500 MG tablet  Commonly known as:  TYLENOL  Take 500-1,000 mg by mouth every 6 (six) hours as needed for moderate pain.     apixaban 5 MG Tabs tablet  Commonly known as:  ELIQUIS  Take 1 tablet (5 mg total) by mouth 2  (two) times daily. Take 1st dose 06-07-14     B-COMPLEX PO  Take 1 capsule by mouth every morning.     diltiazem 240 MG 24 hr capsule  Commonly known as:  CARDIZEM CD  Take 1 capsule (240 mg total) by mouth daily.     fenofibrate 160 MG tablet  TAKE 1 TABLET DAILY     furosemide 40 MG tablet  Commonly known as:  LASIX  Take 40 mg by mouth daily. Can take 1 extra tablet by mouth daily as needed for swelling/edema     gabapentin 300 MG capsule  Commonly known as:  NEURONTIN  Take 1 capsule by mouth at bedtime.     insulin aspart protamine- aspart (70-30) 100 UNIT/ML injection  Commonly known as:  NOVOLOG MIX 70/30  Inject 25-30 Units into the skin 2 (two) times daily with a meal.     losartan 50 MG tablet  Commonly known as:  COZAAR  Take 1 tablet (50 mg total) by mouth daily.     metFORMIN 500 MG tablet  Commonly known as:  GLUCOPHAGE  Take 500 mg by mouth 2 (two) times daily with a meal.     metoprolol succinate 50 MG 24 hr tablet  Commonly known as:  TOPROL-XL  Take 1.5 tablets (53m) by mouth once daily     NEOMYCIN-POLYMYXIN-HYDROCORTISONE 1 % Soln otic solution  Commonly known as:  CORTISPORIN  Place 4 drops into both ears 2 (two) times daily as needed. For pain and itching     oxyCODONE-acetaminophen 5-325 MG per tablet  Commonly known as:  ROXICET  Take 1 tablet by  mouth every 4 (four) hours as needed for severe pain.     potassium chloride 10 MEQ tablet  Commonly known as:  K-DUR,KLOR-CON  Take 10 mEq by mouth every morning.     tamsulosin 0.4 MG Caps capsule  Commonly known as:  FLOMAX  Take 1 capsule (0.4 mg total) by mouth at bedtime.     Vitamin D3 5000 UNITS Caps  Take 1 capsule by mouth every morning.         SignedAlexis Frock 09/09/2014, 7:28 AM

## 2014-09-11 ENCOUNTER — Encounter (HOSPITAL_COMMUNITY): Payer: Self-pay | Admitting: Urology

## 2014-09-13 ENCOUNTER — Other Ambulatory Visit: Payer: Self-pay | Admitting: Urology

## 2014-09-13 NOTE — Progress Notes (Signed)
Please put orders in Epic for Same Day surgery 09-15-14 Thanks

## 2014-09-14 ENCOUNTER — Encounter (HOSPITAL_COMMUNITY): Payer: Self-pay | Admitting: *Deleted

## 2014-09-15 ENCOUNTER — Encounter (HOSPITAL_COMMUNITY)
Admission: RE | Disposition: A | Discharge status: Home / Self Care | Payer: Self-pay | Source: Ambulatory Visit | Attending: Urology

## 2014-09-15 ENCOUNTER — Ambulatory Visit (HOSPITAL_COMMUNITY): Payer: Medicare Other | Admitting: Anesthesiology

## 2014-09-15 ENCOUNTER — Ambulatory Visit (HOSPITAL_COMMUNITY)
Admission: RE | Admit: 2014-09-15 | Discharge: 2014-09-15 | Disposition: A | Discharge status: Home / Self Care | Payer: Medicare Other | Source: Ambulatory Visit | Attending: Urology | Admitting: Urology

## 2014-09-15 ENCOUNTER — Encounter (HOSPITAL_COMMUNITY): Payer: Self-pay | Admitting: *Deleted

## 2014-09-15 DIAGNOSIS — Z794 Long term (current) use of insulin: Secondary | ICD-10-CM | POA: Insufficient documentation

## 2014-09-15 DIAGNOSIS — Z950 Presence of cardiac pacemaker: Secondary | ICD-10-CM | POA: Diagnosis not present

## 2014-09-15 DIAGNOSIS — Z87442 Personal history of urinary calculi: Secondary | ICD-10-CM | POA: Insufficient documentation

## 2014-09-15 DIAGNOSIS — Z8673 Personal history of transient ischemic attack (TIA), and cerebral infarction without residual deficits: Secondary | ICD-10-CM | POA: Insufficient documentation

## 2014-09-15 DIAGNOSIS — Z7901 Long term (current) use of anticoagulants: Secondary | ICD-10-CM | POA: Insufficient documentation

## 2014-09-15 DIAGNOSIS — E119 Type 2 diabetes mellitus without complications: Secondary | ICD-10-CM | POA: Diagnosis not present

## 2014-09-15 DIAGNOSIS — E785 Hyperlipidemia, unspecified: Secondary | ICD-10-CM | POA: Insufficient documentation

## 2014-09-15 DIAGNOSIS — I10 Essential (primary) hypertension: Secondary | ICD-10-CM | POA: Insufficient documentation

## 2014-09-15 DIAGNOSIS — Z8542 Personal history of malignant neoplasm of other parts of uterus: Secondary | ICD-10-CM | POA: Diagnosis not present

## 2014-09-15 DIAGNOSIS — R109 Unspecified abdominal pain: Secondary | ICD-10-CM | POA: Diagnosis present

## 2014-09-15 DIAGNOSIS — Z951 Presence of aortocoronary bypass graft: Secondary | ICD-10-CM | POA: Diagnosis not present

## 2014-09-15 DIAGNOSIS — I2510 Atherosclerotic heart disease of native coronary artery without angina pectoris: Secondary | ICD-10-CM | POA: Insufficient documentation

## 2014-09-15 DIAGNOSIS — Z6838 Body mass index (BMI) 38.0-38.9, adult: Secondary | ICD-10-CM | POA: Diagnosis not present

## 2014-09-15 DIAGNOSIS — I480 Paroxysmal atrial fibrillation: Secondary | ICD-10-CM | POA: Diagnosis not present

## 2014-09-15 DIAGNOSIS — Z79899 Other long term (current) drug therapy: Secondary | ICD-10-CM | POA: Diagnosis not present

## 2014-09-15 DIAGNOSIS — Z79891 Long term (current) use of opiate analgesic: Secondary | ICD-10-CM | POA: Diagnosis not present

## 2014-09-15 DIAGNOSIS — N200 Calculus of kidney: Secondary | ICD-10-CM | POA: Insufficient documentation

## 2014-09-15 HISTORY — PX: HOLMIUM LASER APPLICATION: SHX5852

## 2014-09-15 HISTORY — PX: CYSTOSCOPY/URETEROSCOPY/HOLMIUM LASER/STENT PLACEMENT: SHX6546

## 2014-09-15 LAB — GLUCOSE, CAPILLARY: Glucose-Capillary: 199 mg/dL — ABNORMAL HIGH (ref 65–99)

## 2014-09-15 SURGERY — CYSTOSCOPY/URETEROSCOPY/HOLMIUM LASER/STENT PLACEMENT
Anesthesia: General | Laterality: Left

## 2014-09-15 MED ORDER — DEXAMETHASONE SODIUM PHOSPHATE 10 MG/ML IJ SOLN
INTRAMUSCULAR | Status: AC
  Filled 2014-09-15: qty 1

## 2014-09-15 MED ORDER — OXYCODONE-ACETAMINOPHEN 5-325 MG PO TABS: 1.0000 | ORAL_TABLET | ORAL | Status: DC | PRN

## 2014-09-15 MED ORDER — FENTANYL CITRATE (PF) 100 MCG/2ML IJ SOLN: 25.0000 ug | INTRAMUSCULAR | Status: DC | PRN

## 2014-09-15 MED ORDER — LIDOCAINE HCL (CARDIAC) 20 MG/ML IV SOLN
INTRAVENOUS | Status: DC | PRN
  Administered 2014-09-15: 100 mg via INTRAVENOUS

## 2014-09-15 MED ORDER — MIDAZOLAM HCL 2 MG/2ML IJ SOLN
INTRAMUSCULAR | Status: AC
  Filled 2014-09-15: qty 2

## 2014-09-15 MED ORDER — MIDAZOLAM HCL 5 MG/5ML IJ SOLN
INTRAMUSCULAR | Status: DC | PRN
  Administered 2014-09-15 (×2): 1 mg via INTRAVENOUS

## 2014-09-15 MED ORDER — PROPOFOL 10 MG/ML IV BOLUS
INTRAVENOUS | Status: AC
  Filled 2014-09-15: qty 20

## 2014-09-15 MED ORDER — ONDANSETRON HCL 4 MG/2ML IJ SOLN
INTRAMUSCULAR | Status: DC | PRN
  Administered 2014-09-15: 4 mg via INTRAVENOUS

## 2014-09-15 MED ORDER — FENTANYL CITRATE (PF) 100 MCG/2ML IJ SOLN
INTRAMUSCULAR | Status: DC | PRN
  Administered 2014-09-15 (×2): 50 ug via INTRAVENOUS

## 2014-09-15 MED ORDER — KETOROLAC TROMETHAMINE 30 MG/ML IJ SOLN
INTRAMUSCULAR | Status: AC
  Filled 2014-09-15: qty 1

## 2014-09-15 MED ORDER — CEFTRIAXONE SODIUM 1 G IJ SOLR
1.0000 g | INTRAMUSCULAR | Status: AC
  Administered 2014-09-15: 1 g via INTRAVENOUS
  Filled 2014-09-15: qty 10

## 2014-09-15 MED ORDER — LIDOCAINE HCL (CARDIAC) 20 MG/ML IV SOLN
INTRAVENOUS | Status: AC
  Filled 2014-09-15: qty 5

## 2014-09-15 MED ORDER — PROMETHAZINE HCL 25 MG/ML IJ SOLN
6.2500 mg | INTRAMUSCULAR | Status: DC | PRN
Start: 2014-09-15 — End: 2014-09-15

## 2014-09-15 MED ORDER — 0.9 % SODIUM CHLORIDE (POUR BTL) OPTIME
TOPICAL | Status: DC | PRN
  Administered 2014-09-15: 1000 mL

## 2014-09-15 MED ORDER — KETOROLAC TROMETHAMINE 30 MG/ML IJ SOLN
30.0000 mg | Freq: Once | INTRAMUSCULAR | Status: AC | PRN
  Administered 2014-09-15: 30 mg via INTRAVENOUS

## 2014-09-15 MED ORDER — LACTATED RINGERS IV SOLN
INTRAVENOUS | Status: DC
  Administered 2014-09-15: 1000 mL via INTRAVENOUS

## 2014-09-15 MED ORDER — DEXAMETHASONE SODIUM PHOSPHATE 10 MG/ML IJ SOLN
INTRAMUSCULAR | Status: DC | PRN
  Administered 2014-09-15: 10 mg via INTRAVENOUS

## 2014-09-15 MED ORDER — SODIUM CHLORIDE 0.9 % IR SOLN
Status: DC | PRN
Start: 2014-09-15 — End: 2014-09-15
  Administered 2014-09-15: 4000 mL

## 2014-09-15 MED ORDER — IOHEXOL 300 MG/ML  SOLN
INTRAMUSCULAR | Status: DC | PRN
  Administered 2014-09-15: 3 mL

## 2014-09-15 MED ORDER — FENTANYL CITRATE (PF) 100 MCG/2ML IJ SOLN
INTRAMUSCULAR | Status: AC
  Filled 2014-09-15: qty 2

## 2014-09-15 MED ORDER — OXYCODONE-ACETAMINOPHEN 5-325 MG PO TABS: 1.0000 | ORAL_TABLET | Freq: Once | ORAL | Status: DC

## 2014-09-15 MED ORDER — PROPOFOL 10 MG/ML IV BOLUS
INTRAVENOUS | Status: DC | PRN
  Administered 2014-09-15: 200 mg via INTRAVENOUS

## 2014-09-15 MED ORDER — SODIUM CHLORIDE 0.9 % IJ SOLN
INTRAMUSCULAR | Status: AC
  Filled 2014-09-15: qty 10

## 2014-09-15 MED ORDER — ONDANSETRON HCL 4 MG/2ML IJ SOLN
INTRAMUSCULAR | Status: AC
  Filled 2014-09-15: qty 2

## 2014-09-15 SURGICAL SUPPLY — 24 items
BASKET LASER NITINOL 1.9FR (BASKET) IMPLANT
BASKET STNLS GEMINI 4WIRE 3FR (BASKET) IMPLANT
BASKET ZERO TIP NITINOL 2.4FR (BASKET) IMPLANT
CATH INTERMIT  6FR 70CM (CATHETERS) ×3 IMPLANT
CLOTH BEACON ORANGE TIMEOUT ST (SAFETY) ×3 IMPLANT
ELECT REM PT RETURN 9FT ADLT (ELECTROSURGICAL)
ELECTRODE REM PT RTRN 9FT ADLT (ELECTROSURGICAL) IMPLANT
EXTRACTOR STONE NITINOL NGAGE (UROLOGICAL SUPPLIES) ×3 IMPLANT
FIBER LASER FLEXIVA 1000 (UROLOGICAL SUPPLIES) IMPLANT
FIBER LASER FLEXIVA 200 (UROLOGICAL SUPPLIES) ×3 IMPLANT
FIBER LASER FLEXIVA 365 (UROLOGICAL SUPPLIES) IMPLANT
FIBER LASER FLEXIVA 550 (UROLOGICAL SUPPLIES) IMPLANT
FIBER LASER TRAC TIP (UROLOGICAL SUPPLIES) ×3 IMPLANT
GLOVE BIOGEL M STRL SZ7.5 (GLOVE) ×3 IMPLANT
GOWN STRL REUS W/TWL XL LVL3 (GOWN DISPOSABLE) ×6 IMPLANT
GUIDEWIRE ANG ZIPWIRE 038X150 (WIRE) ×6 IMPLANT
GUIDEWIRE STR DUAL SENSOR (WIRE) ×3 IMPLANT
IV NS IRRIG 3000ML ARTHROMATIC (IV SOLUTION) ×3 IMPLANT
PACK CYSTO (CUSTOM PROCEDURE TRAY) ×3 IMPLANT
SHEATH ACCESS URETERAL 38CM (SHEATH) ×3 IMPLANT
STENT CONTOUR 6FRX26X.038 (STENTS) ×3 IMPLANT
SYRINGE 10CC LL (SYRINGE) IMPLANT
SYRINGE IRR TOOMEY STRL 70CC (SYRINGE) IMPLANT
TUBE FEEDING 8FR 16IN STR KANG (MISCELLANEOUS) ×3 IMPLANT

## 2014-09-15 NOTE — Anesthesia Procedure Notes (Signed)
Procedure Name: LMA Insertion Performed by: Gean Maidens Pre-anesthesia Checklist: Patient identified, Emergency Drugs available, Suction available, Timeout performed and Patient being monitored Patient Re-evaluated:Patient Re-evaluated prior to inductionOxygen Delivery Method: Circle system utilized Preoxygenation: Pre-oxygenation with 100% oxygen Intubation Type: IV induction Ventilation: Mask ventilation without difficulty LMA: LMA inserted LMA Size: 4.0 Number of attempts: 1 Airway Equipment and Method: Stylet Placement Confirmation: ETT inserted through vocal cords under direct vision,  positive ETCO2,  CO2 detector and breath sounds checked- equal and bilateral Tube secured with: Tape Dental Injury: Teeth and Oropharynx as per pre-operative assessment

## 2014-09-15 NOTE — Brief Op Note (Signed)
09/15/2014  2:57 PM  PATIENT:  [name]  69 y.o. female  PRE-OPERATIVE DIAGNOSIS:  LEFT RENAL STONE  POST-OPERATIVE DIAGNOSIS:  LEFT RENAL STONE  PROCEDURE:  Procedure(s): CYSTOSCOPY/RETROGRADE/URETEROSCOPY/HOLMIUM LASER/ STONE EXTRACTION /STENT EXCHANGE (Left) HOLMIUM LASER APPLICATION (Left)  SURGEON:  Surgeon(s) and Role:    * Cleon Gustin, MD - Primary  PHYSICIAN ASSISTANT:   ASSISTANTS: none   ANESTHESIA:   general  EBL:     BLOOD ADMINISTERED:none  DRAINS: 6x26 JJ stent without tether  LOCAL MEDICATIONS USED:  NONE  SPECIMEN:  Stone for analysis  DISPOSITION OF SPECIMEN:  N/A  COUNTS:  YES  TOURNIQUET:  * No tourniquets in log *  DICTATION: .Dragon Dictation  PLAN OF CARE: Discharge to home after PACU  PATIENT DISPOSITION:  PACU - hemodynamically stable.   Delay start of Pharmacological VTE agent (>24hrs) due to surgical blood loss or risk of bleeding: no

## 2014-09-15 NOTE — H&P (Signed)
Urology Admission H&P  Chief Complaint: left flank pain  History of Present Illness: Ms Kleinschmidt is a 69yo with a hx of left nephrolithiasis s/p L URS 1 week ago. She returns today for repeat ureteroscopy for residual stone fragments. She denies fevers/chills/sweats. She is having intermittent gross hematuria with the ureteral stent in place.  Past Medical History  Diagnosis Date  . Hyperlipidemia   . TIA (transient ischemic attack)   . Paroxysmal atrial fibrillation     a. s/p Maze in 2005 b. recurrence in March 2016 with >8sec posttermination pauses s/p PPM implant    . Mixed hyperlipidemia   . Coronary atherosclerosis of native coronary artery     a. s/p CABG 2005 Dr Roxy Manns    . Hypertensive heart disease   . Hypertension   . Uterine cancer     age 57 with partial hysterectomy   Past Surgical History  Procedure Laterality Date  . Abdominal hysterectomy    . Coronary artery bypass graft  2005    Dr Roxy Manns with MAZE  . Cholecystectomy    . 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 Cystoscopy with ureteroscopy, stone basketry and stent placement Left 09/08/2014    Procedure: CYSTOSCOPY WITH URETEROSCOPY, [place];  Surgeon: Cleon Gustin, MD;  Location: [facility];  Service: Urology;  Laterality: Left;  . Holmium laser application Left 9/51/8841    Procedure: HOLMIUM LASER APPLICATION;  Surgeon: Cleon Gustin, MD;  Location: [facility];  Service: Urology;  Laterality: Left;    Home Medications:  Prescriptions prior to admission  Medication Sig Dispense Refill Last Dose  . acetaminophen (TYLENOL) 500 MG tablet Take 500-1,000 mg by mouth every 6 (six) hours as needed for moderate pain.   09/01/2014  . apixaban (ELIQUIS) 5 MG TABS tablet Take 1 tablet (5 mg total) by mouth 2 (two) times daily. Take 1st dose 06-07-14 60 tablet 1 09/13/2014  . B Complex-Biotin-FA (B-COMPLEX  PO) Take 1 capsule by mouth every morning.    09/14/2014 at 1430  . Cholecalciferol (VITAMIN D3) 5000 UNITS CAPS Take 5,000 Units by mouth every morning.    09/14/2014 at 1430  . diltiazem (CARDIZEM CD) 240 MG 24 hr capsule Take 1 capsule (240 mg total) by mouth daily. (Patient taking differently: Take 240 mg by mouth every evening. ) 30 capsule 6 09/14/2014 at 1900  . fenofibrate 160 MG tablet TAKE 1 TABLET DAILY (Patient taking differently: TAKE 160 MG BY MOUTH DAILY.) 30 tablet 6 09/15/2014 at 0800  . furosemide (LASIX) 40 MG tablet Take 40 mg by mouth daily. Can take 1 extra tablet by mouth daily as needed for swelling/edema   09/14/2014 at 0700  . gabapentin (NEURONTIN) 300 MG capsule Take 300 mg by mouth at bedtime.    09/14/2014 at 1900  . insulin aspart protamine- aspart (NOVOLOG MIX 70/30) (70-30) 100 UNIT/ML injection Inject 25-30 Units into the skin 2 (two) times daily with a meal.    09/14/2014 at 1500  . losartan (COZAAR) 50 MG tablet Take 1 tablet (50 mg total) by mouth daily. 90 tablet 0 09/14/2014 at 1430  . metFORMIN (GLUCOPHAGE) 500 MG tablet Take 500 mg by mouth 2 (two) times daily with a meal.    09/14/2014 at 1500  . metoprolol succinate (TOPROL-XL) 50 MG 24 hr tablet Take 1.5 tablets (75m) by mouth once daily 45 tablet 11 09/15/2014 at  0800  . NEOMYCIN-POLYMYXIN-HYDROCORTISONE (CORTISPORIN) 1 % SOLN otic solution Place 4 drops into both ears 2 (two) times daily as needed. For pain and itching   unknown  . oxyCODONE-acetaminophen (ROXICET) 5-325 MG per tablet Take 1 tablet by mouth every 4 (four) hours as needed for severe pain. 30 tablet 0 09/14/2014 at 0700  . potassium chloride (K-DUR,KLOR-CON) 10 MEQ tablet Take 10 mEq by mouth every morning.    09/14/2014 at 1445  . tamsulosin (FLOMAX) 0.4 MG CAPS capsule Take 1 capsule (0.4 mg total) by mouth at bedtime. 30 capsule 0 09/14/2014 at 1900   Allergies: No Known Allergies  Family History  Problem Relation Age of Onset  . Hypertension  Mother   . Hyperlipidemia Mother   . Heart disease Mother   . Diabetes Father   . Stroke Father   . Heart disease Father   . Hypertension Sister     3 sisters  . Arthritis Brother   . Heart attack Maternal Grandfather   . Stroke Paternal Grandfather   . Hypertension Daughter    Social History:  reports that she has never smoked. She has never used smokeless tobacco. She reports that she does not drink alcohol or use illicit drugs.  Review of Systems  All other systems reviewed and are negative.   Physical Exam:  Vital signs in last 24 hours: Temp:  [97.9 F (36.6 C)] 97.9 F (36.6 C) (07/01 1050) Pulse Rate:  [76] 76 (07/01 1050) Resp:  [18] 18 (07/01 1050) BP: (156)/(76) 156/76 mmHg (07/01 1050) SpO2:  [100 %] 100 % (07/01 1050) Weight:  [107.106 kg (236 lb 2 oz)] 107.106 kg (236 lb 2 oz) (07/01 1050) Physical Exam  Constitutional: She is oriented to person, place, and time. She appears well-developed and well-nourished.  HENT:  Head: Normocephalic and atraumatic.  Eyes: EOM are normal. Pupils are equal, round, and reactive to light.  Neck: Normal range of motion. Neck supple.  Cardiovascular: Normal rate and regular rhythm.   Respiratory: Effort normal. No respiratory distress.  GI: Soft. She exhibits no distension.  Musculoskeletal: Normal range of motion.  Neurological: She is alert and oriented to person, place, and time.  Skin: Skin is warm and dry.  Psychiatric: She has a normal mood and affect. Her behavior is normal. Judgment and thought content normal.    Laboratory Data:  Results for orders placed or performed during the hospital encounter of 09/15/14 (from the past 24 hour(s))  Glucose, capillary     Status: Abnormal   Collection Time: 09/15/14 10:53 AM  Result Value Ref Range   Glucose-Capillary 199 (H) 65 - 99 mg/dL   Comment 1 Notify RN    No results found for this or any previous visit (from the past 240 hour(s)). Creatinine:  Recent Labs   09/08/14 1435  CREATININE 0.80    Impression/Assessment:  Left renal stone  Plan:  1. Left ureteroscopic stone extraction planned for today  Shavontae Gibeault L 09/15/2014, 1:30 PM

## 2014-09-15 NOTE — Transfer of Care (Signed)
Immediate Anesthesia Transfer of Care Note  Patient: [name]  Procedure(s) Performed: Procedure(s): CYSTOSCOPY/RETROGRADE/URETEROSCOPY/HOLMIUM LASER/ STONE EXTRACTION /STENT EXCHANGE (Left) HOLMIUM LASER APPLICATION (Left)  Patient Location: PACU  Anesthesia Type:General  Level of Consciousness: awake, alert  and oriented  Airway & Oxygen Therapy: Patient Spontanous Breathing and Patient connected to face mask oxygen  Post-op Assessment: Report given to RN and Post -op Vital signs reviewed and stable  Post vital signs: Reviewed and stable  Last Vitals:  Filed Vitals:   09/15/14 1050  BP: 156/76  Pulse: 76  Temp: 36.6 C  Resp: 18    Complications: No apparent anesthesia complications

## 2014-09-15 NOTE — Discharge Instructions (Signed)
Cystoscopy, Care After Refer to this sheet in the next few weeks. These instructions provide you with information on caring for yourself after your procedure. Your caregiver may also give you more specific instructions. Your treatment has been planned according to current medical practices, but problems sometimes occur. Call your caregiver if you have any problems or questions after your procedure. HOME CARE INSTRUCTIONS  Things you can do to ease any discomfort after your procedure include:  Drinking enough water and fluids to keep your urine clear or pale yellow.  Taking a warm bath to relieve any burning feelings. SEEK IMMEDIATE MEDICAL CARE IF:   You have an increase in blood in your urine.  You notice blood clots in your urine.  You have difficulty passing urine.  You have the chills.  You have abdominal pain.  You have a fever or persistent symptoms for more than 2-3 days.  You have a fever and your symptoms suddenly get worse. MAKE SURE YOU:   Understand these instructions.  Will watch your condition.  Will get help right away if you are not doing well or get worse. Document Released: 09/20/2004 Document Revised: 11/03/2012 Document Reviewed: 08/25/2011 [facility] Patient Information 2015 [place], [place]. This information is not intended to replace advice given to you by your health care provider. Make sure you discuss any questions you have with your health care provider.  Ureteral Stent Implantation, Care After Refer to this sheet in the next few weeks. These instructions provide you with information on caring for yourself after your procedure. Your health care provider may also give you more specific instructions. Your treatment has been planned according to current medical practices, but problems sometimes occur. Call your health care provider if you have any problems or questions after your procedure. WHAT TO EXPECT AFTER THE PROCEDURE You should be back to normal  activity within 48 hours after the procedure. Nausea and vomiting may occur and are commonly the result of anesthesia. It is common to experience sharp pain in the back or lower abdomen and penis with voiding. This is caused by movement of the ends of the stent with the act of urinating.It usually goes away within minutes after you have stopped urinating. HOME CARE INSTRUCTIONS Make sure to drink plenty of fluids. You may have small amounts of bleeding, causing your urine to be red. This is normal. Certain movements may trigger pain or a feeling that you need to urinate. You may be given medicines to prevent infection or bladder spasms. Be sure to take all medicines as directed. Only take over-the-counter or prescription medicines for pain, discomfort, or fever as directed by your health care provider. Do not take aspirin, as this can make bleeding worse. Your stent will be left in until the blockage is resolved. This may take 2 weeks or longer, depending on the reason for stent implantation. You may have an X-ray exam to make sure your ureter is open and that the stent has not moved out of position (migrated). The stent can be removed by your health care provider in the office. Medicines may be given for comfort while the stent is being removed. Be sure to keep all follow-up appointments so your health care provider can check that you are healing properly. SEEK MEDICAL CARE IF:  You experience increasing pain.  Your pain medicine is not working. SEEK IMMEDIATE MEDICAL CARE IF:  Your urine is dark red or has blood clots.  You are leaking urine (incontinent).  You have a  fever, chills, feeling sick to your stomach (nausea), or vomiting.  Your pain is not relieved by pain medicine.  The end of the stent comes out of the urethra.  You are unable to urinate. Document Released: 11/03/2012 Document Revised: 03/08/2013 Document Reviewed: 11/03/2012 [facility] Patient Information 2015 [place], [place].  This information is not intended to replace advice given to you by your health care provider. Make sure you discuss any questions you have with your health care provider.

## 2014-09-15 NOTE — Anesthesia Preprocedure Evaluation (Addendum)
Anesthesia Evaluation  Patient identified by MRN, date of birth, ID band Patient awake    Reviewed: Allergy & Precautions, NPO status , Patient's Chart, lab work & pertinent test results  Airway Mallampati: II  TM Distance: <3 FB Neck ROM: Full    Dental no notable dental hx.    Pulmonary neg pulmonary ROS,  breath sounds clear to auscultation  Pulmonary exam normal       Cardiovascular hypertension, + CAD and + CABG Normal cardiovascular exam+ dysrhythmias Atrial Fibrillation + pacemaker Rhythm:Regular Rate:Normal     Neuro/Psych negative neurological ROS  negative psych ROS   GI/Hepatic negative GI ROS, Neg liver ROS,   Endo/Other  diabetesMorbid obesity  Renal/GU negative Renal ROS  negative genitourinary   Musculoskeletal negative musculoskeletal ROS (+)   Abdominal   Peds negative pediatric ROS (+)  Hematology negative hematology ROS (+)   Anesthesia Other Findings   Reproductive/Obstetrics negative OB ROS                            Anesthesia Physical Anesthesia Plan  ASA: III  Anesthesia Plan: General   Post-op Pain Management:    Induction: Intravenous  Airway Management Planned: LMA  Additional Equipment:   Intra-op Plan:   Post-operative Plan: Extubation in OR  Informed Consent: I have reviewed the patients History and Physical, chart, labs and discussed the procedure including the risks, benefits and alternatives for the proposed anesthesia with the patient or authorized representative who has indicated his/her understanding and acceptance.   Dental advisory given  Plan Discussed with: CRNA and Surgeon  Anesthesia Plan Comments:         Anesthesia Quick Evaluation

## 2014-09-15 NOTE — Anesthesia Postprocedure Evaluation (Signed)
  Anesthesia Post-op Note  Patient: [name]  Procedure(s) Performed: Procedure(s) (LRB): CYSTOSCOPY/RETROGRADE/URETEROSCOPY/HOLMIUM LASER/ STONE EXTRACTION /STENT EXCHANGE (Left) HOLMIUM LASER APPLICATION (Left)  Patient Location: PACU  Anesthesia Type: General  Level of Consciousness: awake and alert   Airway and Oxygen Therapy: Patient Spontanous Breathing  Post-op Pain: mild  Post-op Assessment: Post-op Vital signs reviewed, Patient's Cardiovascular Status Stable, Respiratory Function Stable, Patent Airway and No signs of Nausea or vomiting  Last Vitals:  Filed Vitals:   09/15/14 1545  BP: 155/69  Pulse: 59  Temp:   Resp: 19    Post-op Vital Signs: stable   Complications: No apparent anesthesia complications

## 2014-09-19 ENCOUNTER — Encounter (HOSPITAL_COMMUNITY): Payer: Self-pay | Admitting: Urology

## 2014-09-19 NOTE — Op Note (Addendum)
Preoperative diagnosis: Left renal stone  Postoperative diagnosis: Same  Procedure: 1 cystoscopy 2. Left retrograde pyelography 3.  Intraoperative fluoroscopy, under one hour, with interpretation 4.  Left ureteroscopic stone manipulation with laser lithotripsy 5.  Left 6 x 26 JJ stent placement  Attending: Rosie Fate  Anesthesia: General  Estimated blood loss: None  Drains: Left 6 x 26 JJ ureteral stent without tether  Antibiotics: Rocephin  Specimen: stone for analysis  Findings: left UPJ stone. No hydronephrosis. No masses/lesions in the bladder. Ureteral orifices in normal anatomic location.  Indications: Patient is a 68 year old female/female with a history of left renal stone and who has persistent left flank pain. She underwent left stone extraction and presents today to remove residual fragments. After discussing treatment options, she decided proceed with right ureteroscopic stone manipulation.  Procedure her in detail: The patient was brought to the operating room and a brief timeout was done to ensure correct patient, correct procedure, correct site.  General anesthesia was administered patient was placed in dorsal lithotomy position.  Her genitalia was then prepped and draped in usual sterile fashion.  A rigid 65 French cystoscope was passed in the urethra and the bladder.  Bladder was inspected and free of masses or lesions.  the ureteral orifices were in the normal orthotopic locations.  Using a grasped the left ureteral stent was brought to the urethral meatus. A zip wire was then advanced through the stent up to the renal pelvis. a 6 french ureteral catheter was then instilled into the left ureteral orifice.  a gentle retrograde was obtained and findings noted above.  we then removed the cystoscope and cannulated the left ureteral orifice with a semirigid ureteroscope.  No stone was found in the ureter. Once we reached the UPJ a sensor wire was advanced in to the renal  pelvis. We then removed the ureteroscope and advanced a 12/14 x 38cm access sheath up to the UPJ. A flexible ureteroscope was then advanced through the sheath. We encountered the stone in the renal pelvis/UPJ.  using using a 200 nm laser fiber and fragmented the stone into smaller pieces.  the pieces were then removed with a Ngage basket.  We then placed and good coil was noted in the the renal pelvis under fluoroscopy and the bladder under direct vision.   once all stone fragments were removed we then placed a 6 x 26 double-j ureteral stent over the original zip wire.   the bladder was then drained and this concluded the procedure which was well tolerated by patient.  Complications: None  Condition: Stable, extubated, transferred to PACU  Plan: Patient is to be discharged home as to follow-up in one week for stent removal.

## 2014-09-19 NOTE — Op Note (Signed)
Preoperative diagnosis: Left renal stone  Postoperative diagnosis: Same  Procedure: 1 cystoscopy 2. Left retrograde pyelography 3.  Intraoperative fluoroscopy, under one hour, with interpretation 4.  Left ureteroscopic stone manipulation with laser lithotripsy 5.  Left 6 x 26 JJ stent placement  Attending: Rosie Fate  Anesthesia: General  Estimated blood loss: None  Drains: Left 6 x 26 JJ ureteral stent without tether  Specimens: Stone for analysis  Antibiotics: ancef  Findings: left UPJ stone. No hydronephrosis. No masses/lesions in the bladder. Ureteral orifices in normal anatomic location.  Indications: Patient is a 69 year old female/female with a history of left renal stone and who has persistent left flank pain.  After discussing treatment options, she decided proceed with left ureteroscopic stone manipulation.  Procedure her in detail: The patient was brought to the operating room and a brief timeout was done to ensure correct patient, correct procedure, correct site.  General anesthesia was administered patient was placed in dorsal lithotomy position.  Her genitalia was then prepped and draped in usual sterile fashion.  A rigid 92 French cystoscope was passed in the urethra and the bladder.  Bladder was inspected free masses or lesions.  the ureteral orifices were in the normal orthotopic locations.  a 6 french ureteral catheter was then instilled into the left ureteral orifice.  a gentle retrograde was obtained and findings noted above.  we then placed a zip wire through the ureteral catheter and advanced up to the renal pelvis.  we then removed the cystoscope and cannulated the left ureteral orifice with a semirigid ureteroscope.  No stone was found in the ureter. Once we reached the UPJ a sensor wire was advanced in to the renal pelvis. We then removed the ureteroscope and advanced a flexible ureteroscope over the sensor wire. We encountered the stone in the renal  pelvis/UPJ.  using using a 200 nm laser fiber and fragmented the stone into smaller pieces.  Several pieces were then removed with a engage basket.  We then placed and good coil was noted in the the renal pelvis under fluoroscopy and the bladder under direct vision.  The caliber of her ureter made it difficult to advance the felxible scope multiple times and the decision was made to abandon the case and place a stent.  We then placed a 6 x 26 double-j ureteral stent over the original zip wire.  the stone fragments were then removed from the bladder and sent for analysis.   the bladder was then drained and this concluded the procedure which was well tolerated by patient.  Complications: None  Condition: Stable, extubated, transferred to PACU  Plan: Patient is to be discharged home as to follow-up in one week left ureteroscopic stone manipulation

## 2014-11-21 ENCOUNTER — Other Ambulatory Visit: Payer: Self-pay | Admitting: Cardiovascular Disease

## 2014-11-30 ENCOUNTER — Ambulatory Visit (INDEPENDENT_AMBULATORY_CARE_PROVIDER_SITE_OTHER): Payer: Medicare Other | Admitting: Cardiovascular Disease

## 2014-11-30 ENCOUNTER — Encounter: Payer: Self-pay | Admitting: Cardiovascular Disease

## 2014-11-30 VITALS — BP 130/80 | HR 63 | Ht 65.0 in | Wt 237.6 lb

## 2014-11-30 DIAGNOSIS — E782 Mixed hyperlipidemia: Secondary | ICD-10-CM

## 2014-11-30 DIAGNOSIS — I119 Hypertensive heart disease without heart failure: Secondary | ICD-10-CM

## 2014-11-30 DIAGNOSIS — I480 Paroxysmal atrial fibrillation: Secondary | ICD-10-CM

## 2014-11-30 DIAGNOSIS — I25810 Atherosclerosis of coronary artery bypass graft(s) without angina pectoris: Secondary | ICD-10-CM | POA: Diagnosis not present

## 2014-11-30 DIAGNOSIS — Z79899 Other long term (current) drug therapy: Secondary | ICD-10-CM

## 2014-11-30 DIAGNOSIS — Z951 Presence of aortocoronary bypass graft: Secondary | ICD-10-CM

## 2014-11-30 DIAGNOSIS — E1165 Type 2 diabetes mellitus with hyperglycemia: Secondary | ICD-10-CM

## 2014-11-30 MED ORDER — DILTIAZEM HCL ER COATED BEADS 120 MG PO CP24: 120.0000 mg | ORAL_CAPSULE | Freq: Every day | ORAL | Status: DC

## 2014-11-30 MED ORDER — METOPROLOL SUCCINATE ER 100 MG PO TB24: 100.0000 mg | ORAL_TABLET | Freq: Every day | ORAL | Status: DC

## 2014-11-30 MED ORDER — ATORVASTATIN CALCIUM 10 MG PO TABS: ORAL_TABLET | ORAL | Status: DC

## 2014-11-30 NOTE — Patient Instructions (Signed)
Your physician has recommended you make the following change in your medication:   1.) the diltiazem has been decreased to 120 mg nightly. ( this has been sent to your pharmacy)  2.) the metoprolol has been increased to 100 mg daily.  3.) start atorvastatin 10 mg. Every other day. ( This has been sent to your pharmacy.)  4.) get some CoQ10 over the counter take 20-300 mg daily. Also purchase fish oil over the counter 2 capsules daily.  Your physician recommends that you return for lab work in: 2 months.  Your physician wants you to follow-up in: 6 months or sooner if needed. You will receive a reminder letter in the mail two months in advance. If you don't receive a letter, please call our office to schedule the follow-up appointment.

## 2014-12-02 ENCOUNTER — Encounter: Payer: Self-pay | Admitting: Cardiovascular Disease

## 2014-12-02 DIAGNOSIS — E1165 Type 2 diabetes mellitus with hyperglycemia: Secondary | ICD-10-CM | POA: Insufficient documentation

## 2014-12-02 NOTE — Progress Notes (Signed)
Patient ID: [name], female   DOB: [birth year], 69 y.o.   MRN: [id number]   Primary M.D.: Dr. [name]  HPI: [name] is a 69 -year-old female who resides for one-year follow-up cardiology evaluation.    [name] admits to a history of CAD, diabetes mellitus, peripheral neuropathy, and hypertension.   She underwent CABG revascularization surgery x2 with a maze procedure for atrial fibrillation at that time by Dr. Roxy Manns.  She experienced 2 episodes of palpitations where she felt her heart was temporarily out of rhythm. She denied associated chest tightness. She does note some shortness of breath. . A 2-D echo Doppler study on 10/05/2012 showed normal systolic function with an ejection fraction of 55-60% with mild tissue Doppler abnormality. Her aortic valve was trileaflet and sclerotic with very mild stenosis with a mean gradient of 10 and a peak gradient of 20 mm respectively. There is trivial AR. She also had posterior calcified mitral annulus with mild MR. A nuclear perfusion study in 2014 revealed mild breast attenuation but otherwise normal perfusion.   Since I last saw her, she underwent permanent pacemaker insertion by Dr. Crissie Sickles.  She also has had issues with kidney stones and underwent 2 urological procedures in June and July 2016 on her left kidney by Dr. Nicolette Bang.  She denies recent chest pressure.  She had recent blood work drawn by Dr. [name].  These are noted below in the laboratory section.  She notes some mild shortness of breath and mild leg swelling.  She denies any awareness of palpitations.  Past Medical History  Diagnosis Date  . Hyperlipidemia   . TIA (transient ischemic attack)   . Paroxysmal atrial fibrillation     a. s/p Maze in 2005 b. recurrence in March 2016 with >8sec posttermination pauses s/p PPM implant    . Mixed hyperlipidemia   . Coronary atherosclerosis of native coronary artery     a. s/p CABG 2005 Dr Roxy Manns    .  Hypertensive heart disease   . Hypertension   . Uterine cancer     age 42 with partial hysterectomy    Past Surgical History  Procedure Laterality Date  . Abdominal hysterectomy    . Coronary artery bypass graft  2005    Dr Roxy Manns with MAZE  . Cholecystectomy    . 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 Cystoscopy with ureteroscopy, stone basketry and stent placement Left 09/08/2014    Procedure: CYSTOSCOPY WITH URETEROSCOPY, [place];  Surgeon: Cleon Gustin, MD;  Location: [facility];  Service: Urology;  Laterality: Left;  . Holmium laser application Left 09/18/8887    Procedure: HOLMIUM LASER APPLICATION;  Surgeon: Cleon Gustin, MD;  Location: [facility];  Service: Urology;  Laterality: Left;  . Cystoscopy/ureteroscopy/holmium laser/stent placement Left 09/15/2014    Procedure: CYSTOSCOPY/RETROGRADE/URETEROSCOPY/HOLMIUM LASER/ STONE EXTRACTION /STENT EXCHANGE;  Surgeon: Cleon Gustin, MD;  Location: [facility];  Service: Urology;  Laterality: Left;  . Holmium laser application Left 03/22/9448    Procedure: HOLMIUM LASER APPLICATION;  Surgeon: Cleon Gustin, MD;  Location: [facility];  Service: Urology;  Laterality: Left;    No Known Allergies  Current Outpatient Prescriptions  Medication Sig Dispense Refill  . acetaminophen (TYLENOL) 500 MG tablet Take 500-1,000 mg by mouth every 6 (six) hours as needed for moderate pain.    [name] apixaban (ELIQUIS) 5 MG  TABS tablet Take 1 tablet (5 mg total) by mouth 2 (two) times daily. Take 1st dose 06-07-14 60 tablet 1  . B Complex-Biotin-FA (B-COMPLEX PO) Take 1 capsule by mouth every morning.     . Cholecalciferol (VITAMIN D3) 1000 UNITS CAPS Take 1,000 Units by mouth daily.    . fenofibrate 160 MG tablet TAKE 1 TABLET DAILY 30 tablet 0  . furosemide (LASIX) 40 MG tablet Take 40 mg by mouth daily. Can take 1 extra tablet by mouth  daily as needed for swelling/edema    . gabapentin (NEURONTIN) 300 MG capsule Take 300 mg by mouth at bedtime.     . insulin aspart protamine- aspart (NOVOLOG MIX 70/30) (70-30) 100 UNIT/ML injection Inject 25-30 Units into the skin 2 (two) times daily with a meal.     . losartan (COZAAR) 50 MG tablet Take 1 tablet (50 mg total) by mouth daily. 90 tablet 0  . metFORMIN (GLUCOPHAGE) 500 MG tablet Take 500 mg by mouth 2 (two) times daily with a meal.     . NEOMYCIN-POLYMYXIN-HYDROCORTISONE (CORTISPORIN) 1 % SOLN otic solution Place 4 drops into both ears 2 (two) times daily as needed. For pain and itching    . potassium chloride (K-DUR,KLOR-CON) 10 MEQ tablet Take 10 mEq by mouth every morning.     [name] atorvastatin (LIPITOR) 10 MG tablet Take 1 tablet every other day 60 tablet 3  . diltiazem (CARDIZEM CD) 120 MG 24 hr capsule Take 1 capsule (120 mg total) by mouth at bedtime. 30 capsule 6  . metoprolol succinate (TOPROL-XL) 100 MG 24 hr tablet Take 1 tablet (100 mg total) by mouth daily. Take with or immediately following a meal. 30 tablet 6   No current facility-administered medications for this visit.    Socially she is married to [employer] She denied 47 years. Has 2 children one grandchild. She is retired. She completed ninth grade education. Has no history of tobacco use. She is not drink alcohol. She does walk approximately 2-3 times per week.  Family History  Problem Relation Age of Onset  . Hypertension Mother   . Hyperlipidemia Mother   . Heart disease Mother   . Diabetes Father   . Stroke Father   . Heart disease Father   . Hypertension Sister     3 sisters  . Arthritis Brother   . Heart attack Maternal Grandfather   . Stroke Paternal Grandfather   . Hypertension Daughter    ROS General: Negative; No fevers, chills, or night sweats;  HEENT: Negative; No changes in vision or hearing, sinus congestion, difficulty swallowing Pulmonary: Negative; No cough, wheezing, shortness  of breath, hemoptysis Cardiovascular: Negative; No chest pain, presyncope, syncope, palpitations GI: Negative; No nausea, vomiting, diarrhea, or abdominal pain GU: Positive for recent left uteroscopic stone manipulation with laser lithotripsy for left kidney stone Musculoskeletal: Negative; no myalgias, joint pain, or weakness Hematologic/Oncology: Negative; no easy bruising, bleeding Endocrine: Positive for diabetes mellitus Neuro: Negative; no changes in balance, headaches Skin: Negative; No rashes or skin lesions Psychiatric: Negative; No behavioral problems, depression Sleep: Positive for snoring, no daytime sleepiness, hypersomnolence, bruxism, restless legs, hypnogognic hallucinations, no cataplexy Other comprehensive 14 point system review is negative.   PE BP 130/80 mmHg  Pulse 63  Ht 5' 5" (1.651 m)  Wt 237 lb 9.6 oz (107.775 kg)  BMI 39.54 kg/m2   Wt Readings from Last 3 Encounters:  11/30/14 237 lb 9.6 oz (107.775 kg)  09/15/14 236 lb 2 oz (  107.106 kg)  09/08/14 240 lb 8 oz (109.09 kg)   General: Alert, oriented, no distress. Normocephalic atraumatic.  Skin: normal turgor, no rashes HEENT: Normocephalic, atraumatic. Pupils round and reactive; sclera anicteric; bilateral xanthelasmas. Extraocular muscles are intact. Fundi mild arteriolar narrowing without hemorrhages or exudates. Nose without nasal septal hypertrophy Mouth/Parynx benign; Mallinpatti scale 3. Neck: No JVD, no carotid bruits with normal carotid upstroke Lungs: clear to ausculatation and percussion; no wheezing or rales Heart: RRR, s1 s2 normal 1/6 systolic murmur aortic area.;  No diastolic murmur; no rubs thrills or heaves Abdomen: Moderate obesity;soft, nontender; no hepatosplenomehaly, BS+; abdominal aorta nontender and not dilated by palpation. Back: No CVA tenderness Pulses 2+; no bruits. Extremities: Trace to 1+ pretibial edema bilaterally, no clubbing cyanosis, Homan's sign negative  Neurologic:  grossly nonfocal Psychologic: Normal affect and mood.  ECG (independently read by me): A paced rhythm at 63 with prolonged AV conduction with a PR 214.  QTC normal at 446 ms.  July 2015 ECG (independently read by me): Sinus rhythm with short PR 88 ms.  Nonspecific T changes.  Incomplete right bundle branch block  10/2013 ECG: Sinus rhythm at 69 beats per minute. QTc interval 445 ms.  LABS: BMP Latest Ref Rng 09/08/2014 07/29/2014 06/21/2014  Glucose 65 - 99 mg/dL 177(H) 206(H) 133(H)  BUN 6 - 20 mg/dL _0 Creatinine 0.44 - 1.00 mg/dL 0.80 0.90 0.79  Sodium 135 - 145 mmol/L 138 138 136  Potassium 3.5 - 5.1 mmol/L 3.8 3.7 3.9  Chloride 101 - 111 mmol/L 102 101 98  CO2 22 - 32 mmol/L _1 Calcium 8.9 - 10.3 mg/dL 9.4 9.6 9.7   Hepatic Function Latest Ref Rng 06/04/2014 05/25/2010 05/23/2010  Total Protein 6.0 - 8.3 g/dL 6.9 6.3 7.3  Albumin 3.5 - 5.2 g/dL 3.9 3.3(L) 3.9  AST 0 - 37 U/L 36 43(H) 40(H)  ALT 0 - 35 U/L 31 35 34  Alk Phosphatase 39 - 117 U/L 65 59 69  Total Bilirubin 0.3 - 1.2 mg/dL 0.5 0.6 0.5  Bilirubin, Direct 0.0 - 0.5 mg/dL 0.2 - -   CBC Latest Ref Rng 09/08/2014 07/29/2014 06/21/2014  WBC 4.0 - 10.5 K/uL 9.9 12.0(H) 10.7(H)  Hemoglobin 12.0 - 15.0 g/dL 12.3 13.0 12.9  Hematocrit 36.0 - 46.0 % 37.5 39.4 38.2  Platelets 150 - 400 K/uL 313 354 345.0   Lab Results  Component Value Date   MCV 89.9 09/08/2014   MCV 90.0 07/29/2014   MCV 88.8 06/21/2014   Lab Results  Component Value Date   TSH 2.840 06/05/2014   Lab Results  Component Value Date   HGBA1C 9.4* 06/05/2014   Lipid Panel     Component Value Date/Time   CHOL * 05/24/2010 0535    223        ATP III CLASSIFICATION:  <200     mg/dL   Desirable  200-239  mg/dL   Borderline High  >=240    mg/dL   High          TRIG 614* 05/24/2010 0535   HDL 42 05/24/2010 0535   CHOLHDL 5.3 05/24/2010 0535   VLDL UNABLE TO CALCULATE IF TRIGLYCERIDE OVER 400 mg/dL 05/24/2010 0535   LDLCALC  05/24/2010 0535      UNABLE TO CALCULATE IF TRIGLYCERIDE OVER 400 mg/dL        Total Cholesterol/HDL:CHD Risk Coronary Heart Disease Risk Table  Men   Women  1/2 Average Risk   3.4   3.3  Average Risk       5.0   4.4  2 X Average Risk   9.6   7.1  3 X Average Risk  23.4   11.0        Use the calculated Patient Ratio above and the CHD Risk Table to determine the patient's CHD Risk.        ATP III CLASSIFICATION (LDL):  <100     mg/dL   Optimal  100-129  mg/dL   Near or Above                    Optimal  130-159  mg/dL   Borderline  160-189  mg/dL   High  >190     mg/dL   Very High    I personally reviewed.  Laboratory from Dr. Hughie Closs office from 11/27/2014: Fasting glucose 220, BUN 16, creatinine 0.66.  CO2 30.5.  Hemoglobin 12.1, hematocrit 37.3.  Hemoglobin A1c 9.4.  Total cholesterol 204, triglycerides 261, HDL 44, LDL 108, VLDL 52.  RADIOLOGY: No results found.   ASSESSMENT AND PLAN:    [name] is a 69 year old female who has at least a 11 year history of type 2 diabetes mellitus and is status post CABG surgery x2 by Dr. Roxy Manns in 2005. She has  a history of atrial fibrillation and underwent a Maze procedure at the time of her surgery.  Since I last saw her, she required permanent pacemaker insertion which was done by Dr. [name].  She is on eloquence for anticoagulation.  Her ECG today shows an atrially paced rhythm without recurrent AF.  Her diabetes is not well controlled, as manifested by her hemoglobin A1c.  She has an atherogenic dyslipidemic lipid panel and is on fenofibrate  160 mg daily and has xanthelasmas on physical examination.  I also have added fish oil capsules 2 daily.  Remotely, she had issues with statins.  I have suggested she try adding back very low-dose atorvastatin 10 mg initially every third day and then increase the frequency as tolerated.  I suggested coenzyme Q10 supplementation.  She does have mild edema and I am reducing her Cardizem from 240  mg to 120 mg, which should improve this.  I will further titrate her Toprol-XL from 75 mg to 100 mg, which should continue to control her blood pressure.  She has a pacemaker and bradycardia.  We'll no longer be an issue.  She will continue with her furosemide daily.  She is on eliquis anticoagulation and is without bleeding.   Time spent: 30 minutes Troy Sine, MD, [facility] 12/02/2014 11:32 AM

## 2014-12-05 ENCOUNTER — Ambulatory Visit (INDEPENDENT_AMBULATORY_CARE_PROVIDER_SITE_OTHER): Payer: Medicare Other | Admitting: *Deleted

## 2014-12-05 ENCOUNTER — Telehealth: Payer: Self-pay | Admitting: Cardiology

## 2014-12-05 DIAGNOSIS — I495 Sick sinus syndrome: Secondary | ICD-10-CM | POA: Diagnosis not present

## 2014-12-05 NOTE — Telephone Encounter (Signed)
LMOVM reminding pt to send remote transmission.   

## 2014-12-06 NOTE — Progress Notes (Signed)
Remote pacemaker transmission.

## 2014-12-11 LAB — CUP PACEART REMOTE DEVICE CHECK
Brady Statistic AP VS Percent: 49.54 %
Brady Statistic AS VP Percent: 0.04 %
Brady Statistic RA Percent Paced: 49.8 %
Date Time Interrogation Session: 20160920204551
Lead Channel Impedance Value: 361 Ohm
Lead Channel Impedance Value: 475 Ohm
Lead Channel Impedance Value: 494 Ohm
Lead Channel Pacing Threshold Amplitude: 0.625 V
Lead Channel Pacing Threshold Pulse Width: 0.4 ms
Lead Channel Pacing Threshold Pulse Width: 0.4 ms
Lead Channel Sensing Intrinsic Amplitude: 1.625 mV
Lead Channel Sensing Intrinsic Amplitude: 15.75 mV
Lead Channel Sensing Intrinsic Amplitude: 15.75 mV
Lead Channel Setting Pacing Amplitude: 2 V
Lead Channel Setting Pacing Amplitude: 2.5 V
MDC IDC MSMT BATTERY REMAINING LONGEVITY: 128 mo
MDC IDC MSMT BATTERY VOLTAGE: 3.04 V
MDC IDC MSMT LEADCHNL RA SENSING INTR AMPL: 1.625 mV
MDC IDC MSMT LEADCHNL RV IMPEDANCE VALUE: 570 Ohm
MDC IDC MSMT LEADCHNL RV PACING THRESHOLD AMPLITUDE: 0.625 V
MDC IDC SET LEADCHNL RV PACING PULSEWIDTH: 0.4 ms
MDC IDC SET LEADCHNL RV SENSING SENSITIVITY: 0.9 mV
MDC IDC SET ZONE DETECTION INTERVAL: 400 ms
MDC IDC STAT BRADY AP VP PERCENT: 0.26 %
MDC IDC STAT BRADY AS VS PERCENT: 50.16 %
MDC IDC STAT BRADY RV PERCENT PACED: 0.3 %
Zone Setting Detection Interval: 400 ms

## 2014-12-12 ENCOUNTER — Encounter: Payer: Self-pay | Admitting: Cardiovascular Disease

## 2014-12-23 ENCOUNTER — Other Ambulatory Visit: Payer: Self-pay | Admitting: Cardiovascular Disease

## 2014-12-25 NOTE — Telephone Encounter (Signed)
REFILL

## 2014-12-26 ENCOUNTER — Encounter: Payer: Self-pay | Admitting: Cardiology

## 2015-01-04 ENCOUNTER — Encounter: Payer: Self-pay | Admitting: Internal Medicine

## 2015-03-13 ENCOUNTER — Ambulatory Visit (INDEPENDENT_AMBULATORY_CARE_PROVIDER_SITE_OTHER): Payer: Medicare Other | Admitting: *Deleted

## 2015-03-13 DIAGNOSIS — I495 Sick sinus syndrome: Secondary | ICD-10-CM | POA: Diagnosis not present

## 2015-03-13 NOTE — Progress Notes (Signed)
Remote pacemaker transmission.

## 2015-03-14 LAB — CUP PACEART REMOTE DEVICE CHECK
Battery Voltage: 3.03 V
Brady Statistic AP VP Percent: 0.25 %
Brady Statistic AP VS Percent: 51.19 %
Brady Statistic AS VP Percent: 0.01 %
Brady Statistic AS VS Percent: 48.55 %
Brady Statistic RA Percent Paced: 51.44 %
Date Time Interrogation Session: 20161227202714
Implantable Lead Location: 753860
Implantable Lead Model: 5076
Lead Channel Impedance Value: 551 Ohm
Lead Channel Pacing Threshold Amplitude: 0.5 V
Lead Channel Pacing Threshold Amplitude: 0.625 V
Lead Channel Pacing Threshold Pulse Width: 0.4 ms
Lead Channel Pacing Threshold Pulse Width: 0.4 ms
Lead Channel Sensing Intrinsic Amplitude: 1.375 mV
Lead Channel Sensing Intrinsic Amplitude: 13.25 mV
Lead Channel Setting Pacing Amplitude: 2 V
Lead Channel Setting Pacing Amplitude: 2.5 V
Lead Channel Setting Sensing Sensitivity: 0.9 mV
MDC IDC LEAD IMPLANT DT: 20160321
MDC IDC LEAD IMPLANT DT: 20160321
MDC IDC LEAD LOCATION: 753859
MDC IDC MSMT BATTERY REMAINING LONGEVITY: 123 mo
MDC IDC MSMT LEADCHNL RA IMPEDANCE VALUE: 361 Ohm
MDC IDC MSMT LEADCHNL RA IMPEDANCE VALUE: 494 Ohm
MDC IDC MSMT LEADCHNL RA SENSING INTR AMPL: 1.375 mV
MDC IDC MSMT LEADCHNL RV IMPEDANCE VALUE: 494 Ohm
MDC IDC MSMT LEADCHNL RV SENSING INTR AMPL: 13.25 mV
MDC IDC SET LEADCHNL RV PACING PULSEWIDTH: 0.4 ms
MDC IDC STAT BRADY RV PERCENT PACED: 0.26 %

## 2015-03-15 ENCOUNTER — Encounter: Payer: Self-pay | Admitting: Cardiology

## 2015-04-03 ENCOUNTER — Other Ambulatory Visit: Payer: Self-pay | Admitting: Cardiovascular Disease

## 2015-04-03 NOTE — Telephone Encounter (Signed)
Rx request sent to pharmacy.

## 2015-05-31 ENCOUNTER — Ambulatory Visit (INDEPENDENT_AMBULATORY_CARE_PROVIDER_SITE_OTHER): Payer: Medicare Other | Admitting: Cardiovascular Disease

## 2015-05-31 ENCOUNTER — Encounter: Payer: Self-pay | Admitting: Cardiovascular Disease

## 2015-05-31 VITALS — BP 144/72 | HR 87 | Ht 64.0 in | Wt 234.0 lb

## 2015-05-31 DIAGNOSIS — I480 Paroxysmal atrial fibrillation: Secondary | ICD-10-CM

## 2015-05-31 DIAGNOSIS — I119 Hypertensive heart disease without heart failure: Secondary | ICD-10-CM | POA: Diagnosis not present

## 2015-05-31 DIAGNOSIS — E782 Mixed hyperlipidemia: Secondary | ICD-10-CM

## 2015-05-31 DIAGNOSIS — Z951 Presence of aortocoronary bypass graft: Secondary | ICD-10-CM | POA: Diagnosis not present

## 2015-05-31 MED ORDER — OMEGA-3-ACID ETHYL ESTERS 1 G PO CAPS: 2.0000 g | ORAL_CAPSULE | Freq: Two times a day (BID) | ORAL | Status: DC

## 2015-05-31 MED ORDER — ATORVASTATIN CALCIUM 10 MG PO TABS: ORAL_TABLET | ORAL | Status: DC

## 2015-05-31 NOTE — Patient Instructions (Signed)
Your physician recommends that you return for lab work in: 2 months with dr Melina Copa.  Your physician has recommended you make the following change in your medication:   1.) start Lovaza as directed. This has been sent to your pharmacy.  2.) the atorvastatin has been increased to every day.  Your physician wants you to follow-up in: 6 months or sooner if needed. You will receive a reminder letter in the mail two months in advance. If you don't receive a letter, please call our office to schedule the follow-up appointment.  If you need a refill on your cardiac medications before your next appointment, please call your pharmacy.

## 2015-05-31 NOTE — Progress Notes (Signed)
Patient ID: [name], female   DOB: [birth year], 70 y.o.   MRN: [id number]   Primary M.D.: Dr. [name]  HPI: [name] is a 66 -year-old female who resides for a 6 month follow-up cardiology evaluation.    [name] admits to a history of CAD, diabetes mellitus, peripheral neuropathy, and hypertension.   She underwent CABG revascularization surgery x2 with a maze procedure for atrial fibrillation at that time by Dr. Roxy Manns.  She experienced 2 episodes of palpitations where she felt her heart was temporarily out of rhythm. She denied associated chest tightness. She does note some shortness of breath. . A 2-D echo Doppler study on 10/05/2012 showed normal systolic function with an ejection fraction of 55-60% with mild tissue Doppler abnormality. Her aortic valve was trileaflet and sclerotic with very mild stenosis with a mean gradient of 10 and a peak gradient of 20 mm respectively. There is trivial AR. She also had posterior calcified mitral annulus with mild MR.  A nuclear perfusion study in 2014 revealed mild breast attenuation but otherwise normal perfusion.   She underwent permanent pacemaker insertion by Dr. Crissie Sickles.  She also has had issues with kidney stones and underwent 2 urological procedures in June and July 2016 on her left kidney by Dr. Nicolette Bang.  She denies recent chest pressure.  She had recent blood work drawn by Dr. [name].  These are noted below in the laboratory section.  She notes some mild shortness of breath and mild leg swelling.  She denies any awareness of palpitations.  She presents to the office today stating that she is concerned about taking Eliquis due to cost. Is wondering about switching to aspirin. She recently had issues with elevation of her blood sugars.  She states that she has significant changed her diet. She had recent  lab work done by Dr. Melina Copa on 02/20/2017and I reviewed these laboratory.  Lipid studies revealed a total  cholesterol 186, LDL cholesterol 80, triglycerides 333 and VLDL cholesterol 67. Her glucose was 283. She presents for evaluation.  Past Medical History  Diagnosis Date  . Hyperlipidemia   . TIA (transient ischemic attack)   . Paroxysmal atrial fibrillation ([place])     a. s/p Maze in 2005 b. recurrence in March 2016 with >8sec posttermination pauses s/p PPM implant    . Mixed hyperlipidemia   . Coronary atherosclerosis of native coronary artery     a. s/p CABG 2005 Dr Roxy Manns    . Hypertensive heart disease   . Hypertension   . Uterine cancer [facility])     age 65 with partial hysterectomy    Past Surgical History  Procedure Laterality Date  . Abdominal hysterectomy    . Coronary artery bypass graft  2005    Dr Roxy Manns with MAZE  . Cholecystectomy    . 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 Cystoscopy with ureteroscopy, stone basketry and stent placement Left 09/08/2014    Procedure: CYSTOSCOPY WITH URETEROSCOPY, [place];  Surgeon: Cleon Gustin, MD;  Location: [facility];  Service: Urology;  Laterality: Left;  . Holmium laser application Left 0/96/2836    Procedure: HOLMIUM LASER APPLICATION;  Surgeon: Cleon Gustin, MD;  Location: [facility];  Service: Urology;  Laterality: Left;  . Cystoscopy/ureteroscopy/holmium laser/stent placement Left 09/15/2014    Procedure: CYSTOSCOPY/RETROGRADE/URETEROSCOPY/HOLMIUM LASER/ STONE EXTRACTION /STENT EXCHANGE;  Surgeon: Cleon Gustin,  MD;  Location: [facility];  Service: Urology;  Laterality: Left;  . Holmium laser application Left 03/25/2991    Procedure: HOLMIUM LASER APPLICATION;  Surgeon: Cleon Gustin, MD;  Location: [facility];  Service: Urology;  Laterality: Left;    No Known Allergies  Current Outpatient Prescriptions  Medication Sig Dispense Refill  . acetaminophen (TYLENOL) 500 MG tablet Take 500-1,000 mg by mouth every 6  (six) hours as needed for moderate pain.    [name] apixaban (ELIQUIS) 5 MG TABS tablet Take 1 tablet (5 mg total) by mouth 2 (two) times daily. Take 1st dose 06-07-14 60 tablet 1  . atorvastatin (LIPITOR) 10 MG tablet Take 1 tablet every  day 60 tablet 3  . B Complex-Biotin-FA (B-COMPLEX PO) Take 1 capsule by mouth every morning.     . Cholecalciferol (VITAMIN D3) 1000 UNITS CAPS Take 1,000 Units by mouth daily.    [name] diltiazem (CARDIZEM CD) 120 MG 24 hr capsule Take 1 capsule (120 mg total) by mouth at bedtime. 30 capsule 6  . fenofibrate 160 MG tablet TAKE 1 TABLET DAILY 30 tablet 2  . furosemide (LASIX) 40 MG tablet Take 40 mg by mouth daily. Can take 1 extra tablet by mouth daily as needed for swelling/edema    . gabapentin (NEURONTIN) 300 MG capsule Take 300 mg by mouth at bedtime.     . insulin aspart protamine- aspart (NOVOLOG MIX 70/30) (70-30) 100 UNIT/ML injection Inject 25-30 Units into the skin 2 (two) times daily with a meal.     . losartan (COZAAR) 50 MG tablet TAKE 1 TABLET DAILY 90 tablet 0  . metFORMIN (GLUCOPHAGE) 500 MG tablet Take 500 mg by mouth 2 (two) times daily with a meal.     . metoprolol succinate (TOPROL-XL) 100 MG 24 hr tablet Take 1 tablet (100 mg total) by mouth daily. Take with or immediately following a meal. 30 tablet 6  . NEOMYCIN-POLYMYXIN-HYDROCORTISONE (CORTISPORIN) 1 % SOLN otic solution Place 4 drops into both ears 2 (two) times daily as needed. For pain and itching    . potassium chloride (K-DUR,KLOR-CON) 10 MEQ tablet Take 10 mEq by mouth every morning.     [name] omega-3 acid ethyl esters (LOVAZA) 1 g capsule Take 2 capsules (2 g total) by mouth 2 (two) times daily. 120 capsule 11   No current facility-administered medications for this visit.    Socially she is married to [employer]; she has 2 children one grandchild. She is retired. She completed ninth grade education. Has no history of tobacco use. She is not drink alcohol. She does walk approximately 2-3  times per week.  Family History  Problem Relation Age of Onset  . Hypertension Mother   . Hyperlipidemia Mother   . Heart disease Mother   . Diabetes Father   . Stroke Father   . Heart disease Father   . Hypertension Sister     3 sisters  . Arthritis Brother   . Heart attack Maternal Grandfather   . Stroke Paternal Grandfather   . Hypertension Daughter    ROS General: Negative; No fevers, chills, or night sweats;  HEENT: Negative; No changes in vision or hearing, sinus congestion, difficulty swallowing Pulmonary: Negative; No cough, wheezing, shortness of breath, hemoptysis Cardiovascular: Negative; No chest pain, presyncope, syncope, palpitations GI: Negative; No nausea, vomiting, diarrhea, or abdominal pain GU: Positive for recent left uteroscopic stone manipulation with laser lithotripsy for left kidney stone Musculoskeletal: Negative; no myalgias, joint pain, or weakness Hematologic/Oncology:  Negative; no easy bruising, bleeding Endocrine: Positive for diabetes mellitus Neuro: Negative; no changes in balance, headaches Skin: Negative; No rashes or skin lesions Psychiatric: Negative; No behavioral problems, depression Sleep: Positive for snoring, no daytime sleepiness, hypersomnolence, bruxism, restless legs, hypnogognic hallucinations, no cataplexy Other comprehensive 14 point system review is negative.   PE BP 144/72 mmHg  Pulse 87  Ht _0  (1.626 m)  Wt 234 lb (106.142 kg)  BMI 40.15 kg/m2   Repeat blood pressure 150/70  Wt Readings from Last 3 Encounters:  05/31/15 234 lb (106.142 kg)  11/30/14 237 lb 9.6 oz (107.775 kg)  09/15/14 236 lb 2 oz (107.106 kg)   General: Alert, oriented, no distress. Normocephalic atraumatic.  Skin: normal turgor, no rashes HEENT: Normocephalic, atraumatic. Pupils round and reactive; sclera anicteric; bilateral xanthelasmas. Extraocular muscles are intact. Fundi mild arteriolar narrowing without hemorrhages or exudates. Nose  without nasal septal hypertrophy Mouth/Parynx benign; Mallinpatti scale 3. Neck: No JVD, no carotid bruits with normal carotid upstroke Lungs: clear to ausculatation and percussion; no wheezing or rales Heart: RRR, s1 s2 normal 1/6 systolic murmur aortic area.;  No diastolic murmur; no rubs thrills or heaves Abdomen: Moderate obesity;soft, nontender; no hepatosplenomehaly, BS+; abdominal aorta nontender and not dilated by palpation. Back: No CVA tenderness Pulses 2+; no bruits. Extremities: Trace pretibial edema bilaterally, no clubbing cyanosis, Homan's sign negative  Neurologic: grossly nonfocal Psychologic: Normal affect and mood.  ECG (independently read by me):  Atrially paced rhythm with prolonged AV conduction with PR interval 232 ms.  Nonspecific ST-T changes.  September 2016 ECG (independently read by me): A paced rhythm at 63 with prolonged AV conduction with a PR 214.  QTC normal at 446 ms.  July 2015 ECG (independently read by me): Sinus rhythm with short PR 88 ms.  Nonspecific T changes.  Incomplete right bundle branch block  10/2013 ECG: Sinus rhythm at 69 beats per minute. QTc interval 445 ms.  LABS: BMP Latest Ref Rng 09/08/2014 07/29/2014 06/21/2014  Glucose 65 - 99 mg/dL 177(H) 206(H) 133(H)  BUN 6 - 20 mg/dL _1 Creatinine 0.44 - 1.00 mg/dL 0.80 0.90 0.79  Sodium 135 - 145 mmol/L 138 138 136  Potassium 3.5 - 5.1 mmol/L 3.8 3.7 3.9  Chloride 101 - 111 mmol/L 102 101 98  CO2 22 - 32 mmol/L _2 Calcium 8.9 - 10.3 mg/dL 9.4 9.6 9.7   Hepatic Function Latest Ref Rng 06/04/2014 05/25/2010 05/23/2010  Total Protein 6.0 - 8.3 g/dL 6.9 6.3 7.3  Albumin 3.5 - 5.2 g/dL 3.9 3.3(L) 3.9  AST 0 - 37 U/L 36 43(H) 40(H)  ALT 0 - 35 U/L 31 35 34  Alk Phosphatase 39 - 117 U/L 65 59 69  Total Bilirubin 0.3 - 1.2 mg/dL 0.5 0.6 0.5  Bilirubin, Direct 0.0 - 0.5 mg/dL 0.2 - -   CBC Latest Ref Rng 09/08/2014 07/29/2014 06/21/2014  WBC 4.0 - 10.5 K/uL 9.9 12.0(H) 10.7(H)  Hemoglobin  12.0 - 15.0 g/dL 12.3 13.0 12.9  Hematocrit 36.0 - 46.0 % 37.5 39.4 38.2  Platelets 150 - 400 K/uL 313 354 345.0   Lab Results  Component Value Date   MCV 89.9 09/08/2014   MCV 90.0 07/29/2014   MCV 88.8 06/21/2014   Lab Results  Component Value Date   TSH 2.840 06/05/2014   Lab Results  Component Value Date   HGBA1C 9.4* 06/05/2014   Lipid Panel     Component Value Date/Time   CHOL *  05/24/2010 0535    223        ATP III CLASSIFICATION:  <200     mg/dL   Desirable  200-239  mg/dL   Borderline High  >=240    mg/dL   High          TRIG 614* 05/24/2010 0535   HDL 42 05/24/2010 0535   CHOLHDL 5.3 05/24/2010 0535   VLDL UNABLE TO CALCULATE IF TRIGLYCERIDE OVER 400 mg/dL 05/24/2010 0535   LDLCALC  05/24/2010 0535    UNABLE TO CALCULATE IF TRIGLYCERIDE OVER 400 mg/dL        Total Cholesterol/HDL:CHD Risk Coronary Heart Disease Risk Table                     Men   Women  1/2 Average Risk   3.4   3.3  Average Risk       5.0   4.4  2 X Average Risk   9.6   7.1  3 X Average Risk  23.4   11.0        Use the calculated Patient Ratio above and the CHD Risk Table to determine the patient's CHD Risk.        ATP III CLASSIFICATION (LDL):  <100     mg/dL   Optimal  100-129  mg/dL   Near or Above                    Optimal  130-159  mg/dL   Borderline  160-189  mg/dL   High  >190     mg/dL   Very High   I personally reviewed.  Laboratory from Dr. Hughie Closs office from   February 22,017.  I personally reviewed.  Laboratory from Dr. Hughie Closs office from 11/27/2014: Fasting glucose 220, BUN 16, creatinine 0.66.  CO2 30.5.  Hemoglobin 12.1, hematocrit 37.3.  Hemoglobin A1c 9.4.  Total cholesterol 204, triglycerides 261, HDL 44, LDL 108, VLDL 52.   RADIOLOGY: No results found.   ASSESSMENT AND PLAN:    [name] is a 69 year old female who has a 12 year history of type 2 diabetes mellitus and is status post CABG surgery x2 by Dr. Roxy Manns in 2005. She has  a  history of atrial fibrillation and underwent a Maze procedure at the time of her surgery.  She required permanent pacemaker insertion which was done by Dr. [name].  She is on eliquis for anticoagulation.  Her ECG today shows an atrially paced rhythm without recurrent AF.  She was very concerned about the cost of eloquence and was wondering about switching to aspirin. She has a cha2ds2Vasc score of  at least 5 by virtue of her female sex, age, CAD, diabetes mellitus and hypertensive history. Her diabetes is not well controlled, as manifested by her hemoglobin A1c.  She has an atherogenic dyslipidemic lipid panel and is on fenofibrate  160 mg daily and has xanthelasmas on physical examination.  When I last saw her, I recommended the addition of fish oil but she has not been utilizing this.  I also recommended a retrial of statin therapy, which reportedly she had not tolerated well in the past.  She has been able to take Lipitor 10 mg and has been taking this every other day along with coenzyme Q10 and denies any myalgias.  I will further titrate this to 10 mg daily.  I'm adding lovaza 2 capsules twice a day to her medical regimen.   Her  edema has improved from last evaluation with the reduced dose of Cardizem. She is tolerated the increase Toprol dose to 100 mg.  Her ECG is stable with a chili paced rhythm with a ventricular rate at 87 bpm.  She is morbidly obese with a body mass index of 40.15.  I again discussed importance of weight loss. She will be seeing Dr. [name] in 2 months.  I will ask that  follow-up laboratory be obtained at that time.  I will see her in 6 months for cardiology reevaluation.   Time spent: 25 minutes   Troy Sine, MD, [facility] 05/31/2015 3:56 PM

## 2015-06-07 ENCOUNTER — Encounter (HOSPITAL_COMMUNITY): Payer: Self-pay | Admitting: Emergency Medicine

## 2015-06-07 ENCOUNTER — Emergency Department (HOSPITAL_COMMUNITY): Payer: Medicare Other

## 2015-06-07 ENCOUNTER — Inpatient Hospital Stay (HOSPITAL_COMMUNITY)
Admission: EM | Admit: 2015-06-07 | Discharge: 2015-06-08 | DRG: 310 | Disposition: A | Discharge status: Home / Self Care | Payer: Medicare Other | Attending: Cardiovascular Disease | Admitting: Cardiovascular Disease

## 2015-06-07 ENCOUNTER — Encounter: Payer: Medicare Other | Admitting: Internal Medicine

## 2015-06-07 DIAGNOSIS — Z951 Presence of aortocoronary bypass graft: Secondary | ICD-10-CM

## 2015-06-07 DIAGNOSIS — Z7982 Long term (current) use of aspirin: Secondary | ICD-10-CM | POA: Diagnosis not present

## 2015-06-07 DIAGNOSIS — Z6836 Body mass index (BMI) 36.0-36.9, adult: Secondary | ICD-10-CM | POA: Diagnosis present

## 2015-06-07 DIAGNOSIS — G4730 Sleep apnea, unspecified: Secondary | ICD-10-CM | POA: Diagnosis present

## 2015-06-07 DIAGNOSIS — I480 Paroxysmal atrial fibrillation: Secondary | ICD-10-CM | POA: Diagnosis present

## 2015-06-07 DIAGNOSIS — I2510 Atherosclerotic heart disease of native coronary artery without angina pectoris: Secondary | ICD-10-CM | POA: Diagnosis present

## 2015-06-07 DIAGNOSIS — Z6839 Body mass index (BMI) 39.0-39.9, adult: Secondary | ICD-10-CM | POA: Diagnosis not present

## 2015-06-07 DIAGNOSIS — E119 Type 2 diabetes mellitus without complications: Secondary | ICD-10-CM

## 2015-06-07 DIAGNOSIS — I119 Hypertensive heart disease without heart failure: Secondary | ICD-10-CM | POA: Diagnosis present

## 2015-06-07 DIAGNOSIS — Z7901 Long term (current) use of anticoagulants: Secondary | ICD-10-CM

## 2015-06-07 DIAGNOSIS — Z9114 Patient's other noncompliance with medication regimen: Secondary | ICD-10-CM

## 2015-06-07 DIAGNOSIS — Z8542 Personal history of malignant neoplasm of other parts of uterus: Secondary | ICD-10-CM | POA: Diagnosis not present

## 2015-06-07 DIAGNOSIS — N200 Calculus of kidney: Secondary | ICD-10-CM

## 2015-06-07 DIAGNOSIS — Z8673 Personal history of transient ischemic attack (TIA), and cerebral infarction without residual deficits: Secondary | ICD-10-CM

## 2015-06-07 DIAGNOSIS — E782 Mixed hyperlipidemia: Secondary | ICD-10-CM | POA: Diagnosis present

## 2015-06-07 DIAGNOSIS — I110 Hypertensive heart disease with heart failure: Secondary | ICD-10-CM | POA: Diagnosis present

## 2015-06-07 DIAGNOSIS — Z950 Presence of cardiac pacemaker: Secondary | ICD-10-CM | POA: Diagnosis present

## 2015-06-07 DIAGNOSIS — Z794 Long term (current) use of insulin: Secondary | ICD-10-CM | POA: Diagnosis not present

## 2015-06-07 DIAGNOSIS — I4891 Unspecified atrial fibrillation: Secondary | ICD-10-CM | POA: Diagnosis present

## 2015-06-07 DIAGNOSIS — R002 Palpitations: Secondary | ICD-10-CM | POA: Diagnosis present

## 2015-06-07 DIAGNOSIS — Z8249 Family history of ischemic heart disease and other diseases of the circulatory system: Secondary | ICD-10-CM | POA: Diagnosis not present

## 2015-06-07 DIAGNOSIS — Z79899 Other long term (current) drug therapy: Secondary | ICD-10-CM | POA: Diagnosis not present

## 2015-06-07 DIAGNOSIS — E8770 Fluid overload, unspecified: Secondary | ICD-10-CM | POA: Diagnosis present

## 2015-06-07 DIAGNOSIS — E6609 Other obesity due to excess calories: Secondary | ICD-10-CM | POA: Diagnosis present

## 2015-06-07 HISTORY — DX: Calculus of kidney: N20.0

## 2015-06-07 LAB — CBC WITH DIFFERENTIAL/PLATELET
BASOS ABS: 0 10*3/uL (ref 0.0–0.1)
BASOS PCT: 0 %
EOS ABS: 0.2 10*3/uL (ref 0.0–0.7)
Eosinophils Relative: 2 %
HCT: 37.2 % (ref 36.0–46.0)
Hemoglobin: 12.3 g/dL (ref 12.0–15.0)
Lymphocytes Relative: 31 %
Lymphs Abs: 2.7 10*3/uL (ref 0.7–4.0)
MCH: 29.8 pg (ref 26.0–34.0)
MCHC: 33.1 g/dL (ref 30.0–36.0)
MCV: 90.1 fL (ref 78.0–100.0)
MONO ABS: 0.5 10*3/uL (ref 0.1–1.0)
MONOS PCT: 6 %
NEUTROS ABS: 5.1 10*3/uL (ref 1.7–7.7)
Neutrophils Relative %: 61 %
Platelets: 283 10*3/uL (ref 150–400)
RBC: 4.13 MIL/uL (ref 3.87–5.11)
RDW: 14.3 % (ref 11.5–15.5)
WBC: 8.4 10*3/uL (ref 4.0–10.5)

## 2015-06-07 LAB — BASIC METABOLIC PANEL
Anion gap: 10 (ref 5–15)
BUN: 17 mg/dL (ref 6–20)
CO2: 24 mmol/L (ref 22–32)
CREATININE: 0.74 mg/dL (ref 0.44–1.00)
Calcium: 9.2 mg/dL (ref 8.9–10.3)
Chloride: 104 mmol/L (ref 101–111)
GFR calc Af Amer: >60 mL/min (ref >60)
Glucose, Bld: 202 mg/dL — ABNORMAL HIGH (ref 65–99)
Potassium: 4 mmol/L (ref 3.5–5.1)
Sodium: 138 mmol/L (ref 135–145)

## 2015-06-07 LAB — I-STAT TROPONIN, ED: TROPONIN I, POC: 0.01 ng/mL (ref 0.00–0.08)

## 2015-06-07 LAB — GLUCOSE, CAPILLARY: GLUCOSE-CAPILLARY: 205 mg/dL — AB (ref 65–99)

## 2015-06-07 LAB — CBG MONITORING, ED: GLUCOSE-CAPILLARY: 162 mg/dL — AB (ref 65–99)

## 2015-06-07 MED ORDER — ATORVASTATIN CALCIUM 10 MG PO TABS: 10.0000 mg | ORAL_TABLET | Freq: Every day | ORAL | Status: DC

## 2015-06-07 MED ORDER — ZOLPIDEM TARTRATE 5 MG PO TABS: 5.0000 mg | ORAL_TABLET | Freq: Every evening | ORAL | Status: DC | PRN

## 2015-06-07 MED ORDER — INSULIN ASPART 100 UNIT/ML ~~LOC~~ SOLN
0.0000 [IU] | Freq: Every day | SUBCUTANEOUS | Status: DC
  Administered 2015-06-07: 2 [IU] via SUBCUTANEOUS

## 2015-06-07 MED ORDER — APIXABAN 5 MG PO TABS
5.0000 mg | ORAL_TABLET | Freq: Two times a day (BID) | ORAL | Status: DC
  Administered 2015-06-07 – 2015-06-08 (×2): 5 mg via ORAL
  Filled 2015-06-07 (×5): qty 1

## 2015-06-07 MED ORDER — ALPRAZOLAM 0.25 MG PO TABS: 0.2500 mg | ORAL_TABLET | Freq: Two times a day (BID) | ORAL | Status: DC | PRN

## 2015-06-07 MED ORDER — FENOFIBRATE 160 MG PO TABS
160.0000 mg | ORAL_TABLET | Freq: Every day | ORAL | Status: DC
  Administered 2015-06-08: 160 mg via ORAL
  Filled 2015-06-07: qty 1

## 2015-06-07 MED ORDER — METFORMIN HCL 500 MG PO TABS
500.0000 mg | ORAL_TABLET | Freq: Two times a day (BID) | ORAL | Status: DC
  Administered 2015-06-08: 500 mg via ORAL
  Filled 2015-06-07: qty 1

## 2015-06-07 MED ORDER — DILTIAZEM LOAD VIA INFUSION
20.0000 mg | Freq: Once | INTRAVENOUS | Status: AC
  Administered 2015-06-07: 20 mg via INTRAVENOUS
  Filled 2015-06-07: qty 20

## 2015-06-07 MED ORDER — SODIUM CHLORIDE 0.9 % IV SOLN: 250.0000 mL | INTRAVENOUS | Status: DC | PRN

## 2015-06-07 MED ORDER — ASPIRIN 81 MG PO TABS: 81.0000 mg | ORAL_TABLET | Freq: Once | ORAL | Status: DC

## 2015-06-07 MED ORDER — SODIUM CHLORIDE 0.9% FLUSH: 3.0000 mL | INTRAVENOUS | Status: DC | PRN

## 2015-06-07 MED ORDER — DILTIAZEM HCL 100 MG IV SOLR
7.5000 mg | Freq: Once | INTRAVENOUS | Status: AC
  Administered 2015-06-07: 7.5 mg via INTRAVENOUS

## 2015-06-07 MED ORDER — METOPROLOL SUCCINATE ER 100 MG PO TB24
100.0000 mg | ORAL_TABLET | Freq: Every day | ORAL | Status: DC
  Administered 2015-06-08: 100 mg via ORAL
  Filled 2015-06-07: qty 1

## 2015-06-07 MED ORDER — INSULIN ASPART PROT & ASPART (70-30 MIX) 100 UNIT/ML ~~LOC~~ SUSP
25.0000 [IU] | Freq: Two times a day (BID) | SUBCUTANEOUS | Status: DC
  Administered 2015-06-08: 25 [IU] via SUBCUTANEOUS
  Filled 2015-06-07: qty 10

## 2015-06-07 MED ORDER — ACETAMINOPHEN 325 MG PO TABS: 650.0000 mg | ORAL_TABLET | ORAL | Status: DC | PRN

## 2015-06-07 MED ORDER — INSULIN ASPART 100 UNIT/ML ~~LOC~~ SOLN
0.0000 [IU] | Freq: Three times a day (TID) | SUBCUTANEOUS | Status: DC
  Administered 2015-06-08: 2 [IU] via SUBCUTANEOUS
  Administered 2015-06-08: 3 [IU] via SUBCUTANEOUS

## 2015-06-07 MED ORDER — OMEGA-3-ACID ETHYL ESTERS 1 G PO CAPS
2.0000 g | ORAL_CAPSULE | Freq: Two times a day (BID) | ORAL | Status: DC
  Administered 2015-06-07 – 2015-06-08 (×2): 2 g via ORAL
  Filled 2015-06-07 (×2): qty 2

## 2015-06-07 MED ORDER — FUROSEMIDE 10 MG/ML IJ SOLN
20.0000 mg | Freq: Once | INTRAMUSCULAR | Status: DC
  Filled 2015-06-07: qty 2

## 2015-06-07 MED ORDER — GABAPENTIN 300 MG PO CAPS
300.0000 mg | ORAL_CAPSULE | Freq: Every day | ORAL | Status: DC
  Administered 2015-06-07: 300 mg via ORAL
  Filled 2015-06-07: qty 1

## 2015-06-07 MED ORDER — DEXTROSE 5 % IV SOLN
5.0000 mg/h | INTRAVENOUS | Status: DC
  Administered 2015-06-07: 5 mg/h via INTRAVENOUS
  Administered 2015-06-07: 10 mg/h via INTRAVENOUS
  Administered 2015-06-08: 12.5 mg/h via INTRAVENOUS
  Filled 2015-06-07 (×4): qty 100

## 2015-06-07 MED ORDER — SODIUM CHLORIDE 0.9% FLUSH
3.0000 mL | Freq: Two times a day (BID) | INTRAVENOUS | Status: DC
Start: 2015-06-07 — End: 2015-06-08

## 2015-06-07 MED ORDER — POTASSIUM CHLORIDE CRYS ER 10 MEQ PO TBCR
10.0000 meq | EXTENDED_RELEASE_TABLET | Freq: Every morning | ORAL | Status: DC
  Administered 2015-06-08: 10 meq via ORAL
  Filled 2015-06-07: qty 1

## 2015-06-07 MED ORDER — NITROGLYCERIN 0.4 MG SL SUBL: 0.4000 mg | SUBLINGUAL_TABLET | SUBLINGUAL | Status: DC | PRN

## 2015-06-07 MED ORDER — DIPHENHYDRAMINE-APAP (SLEEP) 25-500 MG PO TABS: 1.0000 | ORAL_TABLET | Freq: Every evening | ORAL | Status: DC | PRN

## 2015-06-07 MED ORDER — ONDANSETRON HCL 4 MG/2ML IJ SOLN: 4.0000 mg | Freq: Four times a day (QID) | INTRAMUSCULAR | Status: DC | PRN

## 2015-06-07 MED ORDER — ASPIRIN EC 81 MG PO TBEC
81.0000 mg | DELAYED_RELEASE_TABLET | Freq: Every day | ORAL | Status: DC
  Administered 2015-06-08: 81 mg via ORAL
  Filled 2015-06-07: qty 1

## 2015-06-07 MED ORDER — LOSARTAN POTASSIUM 50 MG PO TABS
50.0000 mg | ORAL_TABLET | Freq: Every day | ORAL | Status: DC
  Administered 2015-06-08: 50 mg via ORAL
  Filled 2015-06-07: qty 1

## 2015-06-07 NOTE — ED Notes (Signed)
Per [facility] EMS patient woke up at 0230 this morning with a foot cramp and states "it felt like my heart was out of rhythm" so she called 911.  On EMS arrival patient was in afib RVR with a rate of in the 150's.  EMS gave the patient 20 mg of cardizem and the rate decreased into the low 100's.  Patient's rate at this time is between 105-125.  Patient denies SOB or chest pain at this time.  Patient is alert and oriented at this time.

## 2015-06-07 NOTE — ED Provider Notes (Signed)
CSN: [id number]     Arrival date & time 06/07/15  0801 History   First MD Initiated Contact with Patient 06/07/15 [phone]     Chief Complaint  Patient presents with  . Irregular Heart Beat   70 year old female with multiple medical problems presents with acute onset of palpitations. She has a history of CAD (CABGx2 in 2005), A.fib, DM, TIA, pacemaker, HLD. She last saw her cardiologist Dr. Claiborne Billings on 3/16. She reports she was up at [place] last night after a cramp in her foot woke her up when she felt like she was "out of rhythm". She reports associated fatigue, SOB, cough with one episode of blood tinged sputum, nausea, L arm weakness, and URI symptoms. She states she had missed her Cardizem dose earlier that day. She took an extra Toprol 5m as directed by her cardiologist, took her missed dose of Cardizem 1271m and 4 ASA as directed by EMS. When EMS arrived they gave her Cardizem 2028mV. Denies fever, chills, diaphoresis, chest pain or pressure, abdominal pain, vomiting, diarrhea, increased lower leg edema. Last ST was 2014 which was non-ischemic and last echo was 2014 which showed mild AS and EF 55-60%. 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  HPI  Past Medical History  Diagnosis Date  . Hyperlipidemia   . TIA (transient ischemic attack)   . [place]   a. s/p Maze in 2005 b. recurrence in March 2016 with >8sec posttermination pauses s/p PPM implant    . Mixed hyperlipidemia   . Coronary atherosclerosis of native coronary artery     a. s/p CABG 2005 Dr OweRoxy Manns . Hypertensive heart disease   . Hypertension   . [facility]   age [age][facility] partial hysterectomy   Past Surgical History  Procedure Laterality Date  . Abdominal hysterectomy    . Coronary artery bypass graft  2005    Dr OweRoxy Mannsth MAZE  . Cholecystectomy    . Permanent pacemaker insertion N/A 06/05/2014    MDT Advisa  dual chamber pacemaker implanted by Dr TayLovena Ler tachy-brady syndrome  . Cystoscopy with ureteroscopy, stone basketry and stent placement Left 09/08/2014    Procedure: CYSTOSCOPY WITH URETEROSCOPY, [place] PatCleon GustinD;  [facility];  Service: Urology;  Laterality: Left;  . Holmium laser application Left 09/11/28/9407 Procedure: HOLMIUM LASER APPLICATION;  Surgeon: PatCleon GustinD;  [facility];  Service: Urology;  Laterality: Left;  . Cystoscopy/ureteroscopy/holmium laser/stent placement Left 09/15/2014    Procedure: CYSTOSCOPY/RETROGRADE/URETEROSCOPY/HOLMIUM LASER/ STONE EXTRACTION /STENT EXCHANGE;  Surgeon: PatCleon GustinD;  [facility];  Service: Urology;  Laterality: Left;  . Holmium laser application Left 7/16/10/879 Procedure: HOLMIUM LASER APPLICATION;  Surgeon: PatCleon GustinD;  [facility];  Service: Urology;  Laterality: Left;   Family History  Problem Relation Age of Onset  . Hypertension Mother   . Hyperlipidemia Mother   . Heart disease Mother   . Diabetes Father   . Stroke Father   . Heart disease Father   . Hypertension Sister     3 sisters  . Arthritis Brother   . Heart attack Maternal Grandfather   . Stroke Paternal Grandfather   . Hypertension Daughter    Social History  Substance Use Topics  .  Smoking status: Never Smoker   . Smokeless tobacco: Never Used  . Alcohol Use: No   OB History    No data available     Review of Systems  Constitutional: Positive for fatigue. Negative for fever, chills and diaphoresis.  HENT: Positive for rhinorrhea. Negative for congestion and sinus pressure.   Eyes: Negative for visual disturbance.  Respiratory: Positive for cough, shortness of breath and wheezing. Negative for chest tightness.   Cardiovascular: Positive for palpitations. Negative for chest pain and leg swelling.  Gastrointestinal: Positive for nausea and vomiting. Negative for  abdominal pain and diarrhea.  Musculoskeletal: Positive for arthralgias.       Lower leg pain bilaterally  Skin: Negative for rash.  Neurological: Negative for syncope and weakness.  All other systems reviewed and are negative.     Allergies  Review of patient's allergies indicates no known allergies.  Home Medications   Prior to Admission medications   Medication Sig Start Date End Date Taking? Authorizing Provider  acetaminophen (TYLENOL) 500 MG tablet Take 500-1,000 mg by mouth every 6 (six) hours as needed for moderate pain.   Yes Historical Provider, MD  apixaban (ELIQUIS) 5 MG TABS tablet Take 1 tablet (5 mg total) by mouth 2 (two) times daily. Take 1st dose 06-07-14 06/06/14  Yes Amber Sena Slate, NP  aspirin 81 MG tablet Take 81 mg by mouth once.   Yes Historical Provider, MD  atorvastatin (LIPITOR) 10 MG tablet Take 1 tablet every  day 05/31/15  Yes Troy Sine, MD  B Complex-Biotin-FA (B-COMPLEX PO) Take 1 capsule by mouth every morning.    Yes Historical Provider, MD  Cholecalciferol (VITAMIN D3) 1000 UNITS CAPS Take 1,000 Units by mouth daily.   Yes Historical Provider, MD  diltiazem (CARDIZEM CD) 120 MG 24 hr capsule Take 1 capsule (120 mg total) by mouth at bedtime. 11/30/14  Yes Troy Sine, MD  diphenhydramine-acetaminophen (TYLENOL PM) 25-500 MG TABS tablet Take 1 tablet by mouth at bedtime as needed (sleep).   Yes Historical Provider, MD  fenofibrate 160 MG tablet TAKE 1 TABLET DAILY 04/03/15  Yes Troy Sine, MD  furosemide (LASIX) 40 MG tablet Take 40 mg by mouth daily. Can take 1 extra tablet by mouth daily as needed for swelling/edema   Yes Historical Provider, MD  gabapentin (NEURONTIN) 300 MG capsule Take 300 mg by mouth at bedtime.  07/05/14  Yes Historical Provider, MD  insulin aspart protamine- aspart (NOVOLOG MIX 70/30) (70-30) 100 UNIT/ML injection Inject 25-30 Units into the skin 2 (two) times daily with a meal.    Yes Historical Provider, MD  losartan  (COZAAR) 50 MG tablet TAKE 1 TABLET DAILY 04/03/15  Yes Troy Sine, MD  metFORMIN (GLUCOPHAGE) 500 MG tablet Take 500 mg by mouth 2 (two) times daily with a meal.    Yes Historical Provider, MD  metoprolol succinate (TOPROL-XL) 100 MG 24 hr tablet Take 1 tablet (100 mg total) by mouth daily. Take with or immediately following a meal. 11/30/14  Yes Troy Sine, MD  NEOMYCIN-POLYMYXIN-HYDROCORTISONE (CORTISPORIN) 1 % SOLN otic solution Place 4 drops into both ears 2 (two) times daily as needed. For pain and itching 04/04/14  Yes Historical Provider, MD  omega-3 acid ethyl esters (LOVAZA) 1 g capsule Take 2 capsules (2 g total) by mouth 2 (two) times daily. 05/31/15  Yes Troy Sine, MD  potassium chloride (K-DUR,KLOR-CON) 10 MEQ tablet Take 10 mEq by mouth every morning.  Yes Historical Provider, MD   BP 130/74 mmHg  Pulse 99  Temp(Src) 98 F (36.7 C) (Oral)  Resp 20  SpO2 100%   Physical Exam  Constitutional: She is oriented to person, place, and time. She appears well-developed and well-nourished. No distress.  HENT:  Head: Normocephalic and atraumatic.  Eyes: Conjunctivae are normal. Pupils are equal, round, and reactive to light. Left eye exhibits no discharge. No scleral icterus.  Neck: No JVD present. Carotid bruit is not present.  Cardiovascular: An irregularly irregular rhythm present.  No murmur heard. Pulses:      Radial pulses are 2+ on the right side, and 2+ on the left side.  Pulmonary/Chest: Effort normal. No accessory muscle usage. No respiratory distress. She has no decreased breath sounds. She has no wheezes. She has no rhonchi. She has no rales. She exhibits no tenderness.  Neurological: She is alert and oriented to person, place, and time.  Skin: Skin is warm and dry.  Psychiatric: She has a normal mood and affect.    ED Course  Procedures (including critical care time) Labs Review Labs Reviewed  BASIC METABOLIC PANEL - Abnormal; Notable for the following:     Glucose, Bld 202 (*)    All other components within normal limits  CBG MONITORING, ED - Abnormal; Notable for the following:    Glucose-Capillary 162 (*)    All other components within normal limits  CBC WITH DIFFERENTIAL/PLATELET  Randolm Idol, ED    Imaging Review Dg Chest 2 View  06/07/2015  CLINICAL DATA:  Chest pain and tachycardia starting at 3 a.m. Pacemaker placed in March. Prior CABG. EXAM: CHEST  2 VIEW COMPARISON:  07/29/2014 FINDINGS: Dual lead pacer with leads in satisfactory position. Mild to moderate enlargement of the cardiopericardial silhouette with indistinct pulmonary vasculature and faint interstitial accentuation including Kerley B-lines. Prior CABG. Atherosclerotic calcification aortic arch. Mild thoracic spondylosis.  No blunting of the costophrenic angles. IMPRESSION: 1. Mild-to-moderate enlargement of the cardiopericardial silhouette with interstitial pulmonary edema. Electronically Signed   By: Van Clines M.D.   On: 06/07/2015 08:59   I have personally reviewed and evaluated these images and lab results as part of my medical decision-making.   EKG Interpretation   Date/Time:  Thursday June 07 2015 09:39:35 EDT Ventricular Rate:  74 PR Interval:    QRS Duration: 110 QT Interval:  426 QTC Calculation: 473 R Axis:   54 Text Interpretation:  Atrial fibrillation Low voltage, precordial leads  RSR' in V1 or V2, right VCD or RVH Nonspecific T abnrm, anterolateral  leads Since last tracing rate slower Confirmed by MILLER  MD, BRIAN  [id number]) on 06/07/2015 11:00:42 AM     Cardizem drip started - pt is still feeling SOB and fatigued although she is now rate controlled. Cardiology consulted who will interrogate pacemaker.  MDM   Final diagnoses:  Atrial fibrillation with RVR ([place])    70 year old female with acute onset of A. fib with RVR. After therapy she is still feeling SOB and fatigued. Cardiology was consulted who recommended admission due to  continued poor rate control despite being on a drip and plan for possible conversion tomorrow. She is non-toxic, NAD. She is afebrile, labs are unremarkable, currently rate controlled. CXR showed mild volume overload. Cardiology will give extra dose of Lasix. Pt seen in conjunction with Dr. Sabra Heck.   CRITICAL CARE Performed by: Recardo Evangelist   Total critical care time: 30 minutes  Critical care time was exclusive of  separately billable procedures and treating other patients.  Critical care was necessary to treat or prevent imminent or life-threatening deterioration.  Critical care was time spent personally by me on the following activities: development of treatment plan with patient and/or surrogate as well as nursing, discussions with consultants, evaluation of patient's response to treatment, examination of patient, obtaining history from patient or surrogate, ordering and performing treatments and interventions, ordering and review of laboratory studies, ordering and review of radiographic studies, pulse oximetry and re-evaluation of patient's condition.     Recardo Evangelist, PA-C 06/07/15 1459  Noemi Chapel, MD 06/09/15 [phone]

## 2015-06-07 NOTE — H&P (Signed)
Patient ID: [name] MRN: [id number], DOB/AGE: [birth year]   Admit date: 06/07/2015   Primary Physician: [name], DO Primary Cardiologist: Dr Claiborne Billings  HPI: 70 y/o morbidly obese female from [facility], followed by Dr Dannielle Burn in the past, now Dr Claiborne Billings. She has a history of CABG x 2 with MAZE in 2005. Myoview in July 2014 was low risk. Echo in 2014 showed EF to be 55-60% with mild AS. In March 2016 she presented with AF with RVR and had a long pause with conversion. She underwent placement of a MDT dual chambner pacemaker.  She has had some problems with kidney stones and had ureteroscopy by Dr Alyson Ingles in July. She just saw Dr Claiborne Billings 05/31/15 and was stable from a cardiac standpoint.         The pt presented to the ED last PM with palpations. She was in AF with VR > 100. She admits she forgot to take her Diltiazem yesterday. She also says she has been battling a sinus infection and has been on antibiotics and received a steroid injection from her PCP. She denies any OTC decongestants.              Problem List: Past Medical History  Diagnosis Date  . Hyperlipidemia   . TIA (transient ischemic attack)   . Paroxysmal atrial fibrillation ([place])     a. s/p Maze in 2005 b. recurrence in March 2016 with >8sec posttermination pauses s/p PPM implant    . Mixed hyperlipidemia   . Coronary atherosclerosis of native coronary artery     a. s/p CABG 2005 Dr Roxy Manns    . Hypertensive heart disease   . Hypertension   . Uterine cancer [facility])     age 48 with partial hysterectomy    Past Surgical History  Procedure Laterality Date  . Abdominal hysterectomy    . Coronary artery bypass graft  2005    Dr Roxy Manns with MAZE  . Cholecystectomy    . 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 Cystoscopy with ureteroscopy, stone basketry and stent placement Left 09/08/2014    Procedure: CYSTOSCOPY WITH  URETEROSCOPY, [place];  Surgeon: Cleon Gustin, MD;  [facility];  Service: Urology;  Laterality: Left;  . Holmium laser application Left 6/00/4599    Procedure: HOLMIUM LASER APPLICATION;  Surgeon: Cleon Gustin, MD;  [facility];  Service: Urology;  Laterality: Left;  . Cystoscopy/ureteroscopy/holmium laser/stent placement Left 09/15/2014    Procedure: CYSTOSCOPY/RETROGRADE/URETEROSCOPY/HOLMIUM LASER/ STONE EXTRACTION /STENT EXCHANGE;  Surgeon: Cleon Gustin, MD;  [facility];  Service: Urology;  Laterality: Left;  . Holmium laser application Left 09/20/4140    Procedure: HOLMIUM LASER APPLICATION;  Surgeon: Cleon Gustin, MD;  [facility];  Service: Urology;  Laterality: Left;     Allergies: No Known Allergies   Home Medications Prior to Admission medications   Medication Sig Start Date End Date Taking? Authorizing Provider  acetaminophen (TYLENOL) 500 MG tablet Take 500-1,000 mg by mouth every 6 (six) hours as needed for moderate pain.   Yes Historical Provider, MD  apixaban (ELIQUIS) 5 MG TABS tablet Take 1 tablet (5 mg total) by mouth 2 (two) times daily. Take 1st dose 06-07-14 06/06/14  Yes Amber Sena Slate, NP  aspirin 81 MG tablet Take 81 mg by mouth once.   Yes Historical Provider, MD  atorvastatin (  LIPITOR) 10 MG tablet Take 1 tablet every  day 05/31/15  Yes Troy Sine, MD  B Complex-Biotin-FA (B-COMPLEX PO) Take 1 capsule by mouth every morning.    Yes Historical Provider, MD  Cholecalciferol (VITAMIN D3) 1000 UNITS CAPS Take 1,000 Units by mouth daily.   Yes Historical Provider, MD  diltiazem (CARDIZEM CD) 120 MG 24 hr capsule Take 1 capsule (120 mg total) by mouth at bedtime. 11/30/14  Yes Troy Sine, MD  diphenhydramine-acetaminophen (TYLENOL PM) 25-500 MG TABS tablet Take 1 tablet by mouth at bedtime as needed (sleep).   Yes Historical Provider, MD  fenofibrate 160 MG tablet TAKE 1 TABLET DAILY 04/03/15  Yes Troy Sine, MD  furosemide (LASIX) 40 MG tablet Take 40 mg by mouth daily. Can take 1 extra tablet by mouth daily as needed for swelling/edema   Yes Historical Provider, MD  gabapentin (NEURONTIN) 300 MG capsule Take 300 mg by mouth at bedtime.  07/05/14  Yes Historical Provider, MD  insulin aspart protamine- aspart (NOVOLOG MIX 70/30) (70-30) 100 UNIT/ML injection Inject 25-30 Units into the skin 2 (two) times daily with a meal.    Yes Historical Provider, MD  losartan (COZAAR) 50 MG tablet TAKE 1 TABLET DAILY 04/03/15  Yes Troy Sine, MD  metFORMIN (GLUCOPHAGE) 500 MG tablet Take 500 mg by mouth 2 (two) times daily with a meal.    Yes Historical Provider, MD  metoprolol succinate (TOPROL-XL) 100 MG 24 hr tablet Take 1 tablet (100 mg total) by mouth daily. Take with or immediately following a meal. 11/30/14  Yes Troy Sine, MD  NEOMYCIN-POLYMYXIN-HYDROCORTISONE (CORTISPORIN) 1 % SOLN otic solution Place 4 drops into both ears 2 (two) times daily as needed. For pain and itching 04/04/14  Yes Historical Provider, MD  omega-3 acid ethyl esters (LOVAZA) 1 g capsule Take 2 capsules (2 g total) by mouth 2 (two) times daily. 05/31/15  Yes Troy Sine, MD  potassium chloride (K-DUR,KLOR-CON) 10 MEQ tablet Take 10 mEq by mouth every morning.    Yes Historical Provider, MD     Family History  Problem Relation Age of Onset  . Hypertension Mother   . Hyperlipidemia Mother   . Heart disease Mother   . Diabetes Father   . Stroke Father   . Heart disease Father   . Hypertension Sister     3 sisters  . Arthritis Brother   . Heart attack Maternal Grandfather   . Stroke Paternal Grandfather   . Hypertension Daughter      Social History   Social History  . Marital Status: Married    Spouse Name: N/A  . Number of Children: N/A  . Years of Education: N/A   Occupational History  . Not on file.   Social History Main Topics  . Smoking status: Never Smoker   . Smokeless tobacco: Never Used  .  Alcohol Use: No  . Drug Use: No  . Sexual Activity: Not on file   Other Topics Concern  . Not on file   Social History Narrative     Review of Systems: General: negative for chills, fever, night sweats or weight changes.  Cardiovascular: negative for chest pain, dyspnea on exertion, edema, orthopnea, palpitations, paroxysmal nocturnal dyspnea or shortness of breath HEENT: negative for any visual disturbances, blindness, glaucoma Dermatological: negative for rash Respiratory: negative for cough, hemoptysis, or wheezing Urologic: negative for hematuria or dysuria Abdominal: negative for nausea, vomiting, diarrhea, bright red blood per rectum,  melena, or hematemesis Neurologic: negative for visual changes, syncope, or dizziness Musculoskeletal: negative for back pain, joint pain, or swelling Psych: cooperative and appropriate All other systems reviewed and are otherwise negative except as noted above.  Physical Exam: Blood pressure 130/68, pulse 72, temperature 98 F (36.7 C), temperature source Oral, resp. rate 19, SpO2 98 %.  General appearance: alert, cooperative, no distress and morbidly obese Neck: no carotid bruit and no JVD Lungs: clear to auscultation bilaterally Heart: irregularly irregular rhythm Abdomen: obese, non tender Extremities: no edema Pulses: 2+ and symmetric Skin: Skin color, texture, turgor normal. No rashes or lesions Neurologic: Grossly normal    Labs:   Results for orders placed or performed during the hospital encounter of 06/07/15 (from the past 24 hour(s))  Basic metabolic panel     Status: Abnormal   Collection Time: 06/07/15  8:41 AM  Result Value Ref Range   Sodium 138 135 - 145 mmol/L   Potassium 4.0 3.5 - 5.1 mmol/L   Chloride 104 101 - 111 mmol/L   CO2 24 22 - 32 mmol/L   Glucose, Bld 202 (H) 65 - 99 mg/dL   BUN 17 6 - 20 mg/dL   Creatinine, Ser 0.74 0.44 - 1.00 mg/dL   Calcium 9.2 8.9 - 10.3 mg/dL   GFR calc non Af Amer >60 >60  mL/min   GFR calc Af Amer >60 >60 mL/min   Anion gap 10 5 - 15  CBC with Differential     Status: None   Collection Time: 06/07/15  8:41 AM  Result Value Ref Range   WBC 8.4 4.0 - 10.5 K/uL   RBC 4.13 3.87 - 5.11 MIL/uL   Hemoglobin 12.3 12.0 - 15.0 g/dL   HCT 37.2 36.0 - 46.0 %   MCV 90.1 78.0 - 100.0 fL   MCH 29.8 26.0 - 34.0 pg   MCHC 33.1 30.0 - 36.0 g/dL   RDW 14.3 11.5 - 15.5 %   Platelets 283 150 - 400 K/uL   Neutrophils Relative % 61 %   Neutro Abs 5.1 1.7 - 7.7 K/uL   Lymphocytes Relative 31 %   Lymphs Abs 2.7 0.7 - 4.0 K/uL   Monocytes Relative 6 %   Monocytes Absolute 0.5 0.1 - 1.0 K/uL   Eosinophils Relative 2 %   Eosinophils Absolute 0.2 0.0 - 0.7 K/uL   Basophils Relative 0 %   Basophils Absolute 0.0 0.0 - 0.1 K/uL  I-stat troponin, ED     Status: None   Collection Time: 06/07/15  8:42 AM  Result Value Ref Range   Troponin i, poc 0.01 0.00 - 0.08 ng/mL   Comment 3             Radiology/Studies: Dg Chest 2 View  06/07/2015  CLINICAL DATA:  Chest pain and tachycardia starting at 3 a.m. Pacemaker placed in March. Prior CABG. EXAM: CHEST  2 VIEW COMPARISON:  07/29/2014 FINDINGS: Dual lead pacer with leads in satisfactory position. Mild to moderate enlargement of the cardiopericardial silhouette with indistinct pulmonary vasculature and faint interstitial accentuation including Kerley B-lines. Prior CABG. Atherosclerotic calcification aortic arch. Mild thoracic spondylosis.  No blunting of the costophrenic angles. IMPRESSION: 1. Mild-to-moderate enlargement of the cardiopericardial silhouette with interstitial pulmonary edema. Electronically Signed   By: Van Clines M.D.   On: 06/07/2015 08:59    EKG: AF with VR 118 on admission  ASSESSMENT AND PLAN:  Principal Problem:   Atrial fibrillation with RVR (HCC)   Palpitations  Active Problems:   PAF- CHADs VASc= 7 for age, sex, DM, TIA, Vasc disease, HTN   Hx of CABG 2005 x 2-low risk Myoview July 2014   Type  2 diabetes mellitus ([place])   Morbid obesity   Mixed hyperlipidemia   Hx TIA   Pacemaker- March 2016    Chronic anticoagulation-Eliquis   PLAN:  I reviewed her past EKGs. She has been in AF in the past and asymptomatic as long as her rate is controlled. I wonder if her rate just went up because she forgot her Diltiazem I will contact pacer rep to check her pacemaker and AF burden (she was supposed to see Dr [name] today). Consider starting Amiodarone.    Henri Medal, PA-C 06/07/2015, 11:17 AM [phone]

## 2015-06-07 NOTE — ED Notes (Signed)
Patient states she normally takes cardizem every day but forgot to take it yesterday.

## 2015-06-07 NOTE — Progress Notes (Signed)
06/07/2015 1815 Received pt to room 2W04 from ED with AFIB with RVR.  Pt is A&OX4.  No c/o voiced.  Tele monitor applied and CCMD notified.  Oriented to room, cal llliight and bed.  Call bell in reach, family at bedside. [place]

## 2015-06-07 NOTE — ED Provider Notes (Signed)
The patient is a 70 year old female, she has multiple comorbidities including coronary disease status post bypass grafting 12 years ago, pacemaker placement status post tachybradycardia syndrome and paroxysmal atrial fibrillation. She is on anticoagulation but only takes it once a day instead of twice a day, she states that it is too expensive and so she tries to make it last. She reports that last night about 3:30 in the morning she developed acute onset of palpitations and feeling like her heart beat was irregular. This has been persistent throughout the morning, associated with shortness of breath and some left arm discomfort. On exam the patient does have an irregularly irregular rhythm however it does seem to be going a bit slower, she was given her home dose of Cardizem which she did not take yesterday at 4 AM, she was given a beta blocker at home which she is supposed to take for breakthrough palpitations as well as getting an IV dose of Cardizem from the paramedics. She states that now that her heart rate is slowing down she no longer has shortness of breath or left arm symptoms. She has no peripheral edema, no asymmetry of the legs, no tenderness of the abdomen, her lungs are clear, she does not have a murmur, she is in no acute distress. We'll repeat the EKG now that the heart rate is slower, labs pending, will discuss with cardiology. Currently she is on a Cardizem drip secondary to her persistent tachycardia  CRITICAL CARE Performed by: Johnna Acosta Total critical care time: 35 minutes Critical care time was exclusive of separately billable procedures and treating other patients. Critical care was necessary to treat or prevent imminent or life-threatening deterioration. Critical care was time spent personally by me on the following activities: development of treatment plan with patient and/or surrogate as well as nursing, discussions with consultants, evaluation of patient's response to  treatment, examination of patient, obtaining history from patient or surrogate, ordering and performing treatments and interventions, ordering and review of laboratory studies, ordering and review of radiographic studies, pulse oximetry and re-evaluation of patient's condition.   D/w Wannetta Sender of cardiology service at 10:20 AM - they will come to see the pt.  Medical screening examination/treatment/procedure(s) were conducted as a shared visit with non-physician practitioner(s) and myself.  I personally evaluated the patient during the encounter.  Clinical Impression:   Final diagnoses:  Atrial fibrillation with RVR ([place])     Meds given in ED:  Medications  diltiazem (CARDIZEM) 1 mg/mL load via infusion 20 mg (20 mg Intravenous Given 06/07/15 0923)  diltiazem (CARDIZEM) injection 7.5 mg (0 mg Intravenous Stopped 06/07/15 1400)       Noemi Chapel, MD 06/09/15 [phone]

## 2015-06-08 ENCOUNTER — Encounter (HOSPITAL_COMMUNITY)
Admission: EM | Disposition: A | Discharge status: Home / Self Care | Payer: Self-pay | Source: Home / Self Care | Attending: Cardiovascular Disease

## 2015-06-08 ENCOUNTER — Inpatient Hospital Stay (HOSPITAL_COMMUNITY): Payer: Medicare Other | Admitting: Anesthesiology

## 2015-06-08 DIAGNOSIS — I119 Hypertensive heart disease without heart failure: Secondary | ICD-10-CM

## 2015-06-08 LAB — COMPREHENSIVE METABOLIC PANEL
ALT: 30 U/L (ref 14–54)
AST: 34 U/L (ref 15–41)
Albumin: 3.2 g/dL — ABNORMAL LOW (ref 3.5–5.0)
Alkaline Phosphatase: 41 U/L (ref 38–126)
Anion gap: 8 (ref 5–15)
BUN: 12 mg/dL (ref 6–20)
CO2: 25 mmol/L (ref 22–32)
Calcium: 8.9 mg/dL (ref 8.9–10.3)
Chloride: 106 mmol/L (ref 101–111)
Creatinine, Ser: 0.78 mg/dL (ref 0.44–1.00)
GFR calc Af Amer: >60 mL/min (ref >60)
GFR calc non Af Amer: >60 mL/min (ref >60)
Glucose, Bld: 172 mg/dL — ABNORMAL HIGH (ref 65–99)
Potassium: 3.6 mmol/L (ref 3.5–5.1)
Sodium: 139 mmol/L (ref 135–145)
Total Bilirubin: 0.7 mg/dL (ref 0.3–1.2)
Total Protein: 6.1 g/dL — ABNORMAL LOW (ref 6.5–8.1)

## 2015-06-08 LAB — GLUCOSE, CAPILLARY
GLUCOSE-CAPILLARY: 182 mg/dL — AB (ref 65–99)
GLUCOSE-CAPILLARY: 132 mg/dL — AB (ref 65–99)

## 2015-06-08 LAB — TSH: TSH: 1.651 u[IU]/mL (ref 0.350–4.500)

## 2015-06-08 LAB — MAGNESIUM: Magnesium: 1.6 mg/dL — ABNORMAL LOW (ref 1.7–2.4)

## 2015-06-08 SURGERY — CANCELLED PROCEDURE
Anesthesia: Monitor Anesthesia Care

## 2015-06-08 MED ORDER — DILTIAZEM HCL ER COATED BEADS 120 MG PO CP24: 120.0000 mg | ORAL_CAPSULE | Freq: Every day | ORAL | Status: DC

## 2015-06-08 MED ORDER — LACTATED RINGERS IV SOLN: INTRAVENOUS | Status: DC

## 2015-06-08 MED ORDER — DILTIAZEM HCL ER COATED BEADS 120 MG PO CP24
120.0000 mg | ORAL_CAPSULE | Freq: Every day | ORAL | Status: DC
  Administered 2015-06-08: 120 mg via ORAL
  Filled 2015-06-08: qty 1

## 2015-06-08 MED ORDER — DILTIAZEM HCL ER COATED BEADS 240 MG PO CP24: 240.0000 mg | ORAL_CAPSULE | Freq: Every day | ORAL | Status: DC

## 2015-06-08 NOTE — Anesthesia Preprocedure Evaluation (Deleted)
Anesthesia Evaluation  Patient identified by MRN, date of birth, ID band Patient awake    Reviewed: Allergy & Precautions, NPO status , Patient's Chart, lab work & pertinent test results  Airway Mallampati: II  TM Distance: <3 FB Neck ROM: Full    Dental no notable dental hx. (+) Dental Advisory Given, Teeth Intact   Pulmonary neg pulmonary ROS, sleep apnea ,    Pulmonary exam normal breath sounds clear to auscultation       Cardiovascular hypertension, Pt. on medications + CAD and + CABG  Normal cardiovascular exam+ dysrhythmias Atrial Fibrillation + pacemaker  Rhythm:Regular Rate:Normal  ECG -  AF   Neuro/Psych TIAnegative neurological ROS  negative psych ROS   GI/Hepatic negative GI ROS, Neg liver ROS,   Endo/Other  diabetes, Well Controlled, Type 2, Oral Hypoglycemic AgentsMorbid obesity  Renal/GU negative Renal ROS  negative genitourinary   Musculoskeletal negative musculoskeletal ROS (+)   Abdominal (+) + obese,   Peds negative pediatric ROS (+)  Hematology negative hematology ROS (+)   Anesthesia Other Findings   Reproductive/Obstetrics negative OB ROS                            Anesthesia Physical Anesthesia Plan  ASA: III  Anesthesia Plan: MAC   Post-op Pain Management:    Induction:   Airway Management Planned:   Additional Equipment:   Intra-op Plan:   Post-operative Plan:   Informed Consent:   Plan Discussed with: Surgeon  Anesthesia Plan Comments:         Anesthesia Quick Evaluation

## 2015-06-08 NOTE — Discharge Instructions (Signed)

## 2015-06-08 NOTE — Discharge Summary (Signed)
Discharge Summary    Patient ID: [name],  MRN: [id number], DOB/AGE: 08/10/1945 70 y.o.  Admit date: 06/07/2015 Discharge date: 06/08/2015  Primary Care Provider: [name] Primary Cardiologist: Dr. Claiborne Billings.   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 She has a history of CABG x 2 with MAZE in 2005. Myoview in July 2014 was low risk. Echo in 2014 showed EF to be 55-60% with mild AS. In March 2016 she presented with AF with RVR and had a long pause with conversion. She underwent placement of a MDT dual chambner pacemaker, which is followed by Dr. [name]. She is on chronic anticoagulation with Eliquis but admits to issues with compliance recenly.   She presented to [facility] on [facility]07[facility]15 with a compliant of dyspnea and palpitations and was noted to be in recurent atrial fibrillation with RVR. She admitted to missing several recent doses of her Cardizem. She was admitted to telemetry for further management.    Hospital Course     Admitted 06/07/15 with symptomatic atrial fibrillation w/ RVR. She was placed on IV Cardizem and continued on PO metoprolol. TSH and K were both WNL. Her rate improved to with IV Cardizem and she spontaneously converted to NSR. Her symptoms resolved. Her  diltiazem infusion was up to 12.71m/hr, which is 300 mg daily. Thus her home dose was increased from 120 mg to 240 mg daily. Her first PO dose was given with 2hr IV overlap. She was continued on 100 mg of Metoprolol XL daily. She was monitored on telemetry and remained in SR. Her HR also remained well controlled. She was last seen and examined by Dr. ROval Linseywho determined she was stable for discharge home. She was counsled on the importance of full medication compliance with both her Cardizem and Eliquis. Post hospital f/u will be arranged in 1-2 weeks.   Consultants: none   Discharge Vitals Blood pressure 118/59, pulse 77, temperature 98.3 F (36.8 C), temperature source Oral, resp. rate 19, height 5' 4" (1.626 m), weight 232 lb 11.2 oz (105.552 kg), SpO2 100 %.  Filed Weights   06/07/15 1815  Weight: 232 lb 11.2 oz (105.552 kg)    Labs & Radiologic Studies    CBC  Recent Labs  06/07/15 0841  WBC 8.4  NEUTROABS 5.1  HGB 12.3  HCT 37.2  MCV 90.1  PLT 2986  Basic Metabolic Panel  Recent Labs  06/07/15 0841 06/08/15 0500  NA 138 139  K 4.0 3.6  CL 104 106  CO2 24 25  GLUCOSE 202* 172*  BUN 17 12  CREATININE 0.74 0.78  CALCIUM 9.2 8.9  MG  --  1.6*   Liver Function Tests  Recent Labs  06/08/15 0500  AST 34  ALT 30  ALKPHOS 41  BILITOT 0.7  PROT 6.1*  ALBUMIN 3.2*   No results for input(s): LIPASE, AMYLASE in the last 72 hours. Cardiac Enzymes No results for input(s): CKTOTAL, CKMB, CKMBINDEX, TROPONINI in the last 72 hours. BNP Invalid input(s): POCBNP D-Dimer No results for input(s): DDIMER in the last 72 hours. Hemoglobin A1C No results for input(s): HGBA1C in the last 72 hours. Fasting Lipid Panel No results for input(s): CHOL, HDL, LDLCALC, TRIG, CHOLHDL, LDLDIRECT in the last 72 hours. Thyroid Function Tests  Recent Labs  06/08/15 0500  TSH 1.651   _____________  Dg Chest 2 View  06/07/2015  CLINICAL DATA:  Chest pain and tachycardia  starting at 3 a.m. Pacemaker placed in March. Prior CABG. EXAM: CHEST  2 VIEW COMPARISON:  07/29/2014 FINDINGS: Dual lead pacer with leads in satisfactory position. Mild to moderate enlargement of the cardiopericardial silhouette with indistinct pulmonary vasculature and faint interstitial accentuation including Kerley B-lines. Prior CABG. Atherosclerotic calcification aortic arch. Mild thoracic spondylosis.  No blunting of the costophrenic angles. IMPRESSION: 1. Mild-to-moderate enlargement of the cardiopericardial silhouette with interstitial pulmonary edema. Electronically Signed   By: Van Clines M.D.   On: 06/07/2015 08:59   Disposition   Pt is being discharged home today in good condition.  Follow-up Plans & Appointments    Follow-up Information    Follow up with Troy Sine, MD.   Specialty:  Cardiology   Why:  our office will call you with a follow-up in 1-2 weeks.    Contact information:   [address] [place] [address] [phone]      Discharge Instructions    Diet - low sodium heart healthy    Complete by:  As directed      Increase activity slowly    Complete by:  As directed            Discharge Medications   Current Discharge Medication List    CONTINUE these medications which have CHANGED   Details  diltiazem (CARDIZEM CD) 240 MG 24 hr capsule Take 1 capsule (240 mg total) by mouth at bedtime. Qty: 30 capsule, Refills: 5      CONTINUE these medications which have NOT CHANGED   Details  acetaminophen (TYLENOL) 500 MG tablet Take 500-1,000 mg by mouth every 6 (six) hours as needed for moderate pain.    apixaban (ELIQUIS) 5 MG TABS tablet Take 1 tablet (5 mg total) by mouth 2 (two) times daily. Take 1st dose 06-07-14 Qty: 60 tablet, Refills: 1    aspirin 81 MG tablet Take 81 mg by mouth once.    atorvastatin (LIPITOR) 10 MG tablet Take 1 tablet every  day Qty: 60 tablet, Refills: 3    B Complex-Biotin-FA (B-COMPLEX PO) Take  1 capsule by mouth every morning.     Cholecalciferol (VITAMIN D3) 1000 UNITS CAPS Take 1,000 Units by mouth daily.    diphenhydramine-acetaminophen (TYLENOL PM) 25-500 MG TABS tablet Take 1 tablet by mouth at bedtime as needed (sleep).    fenofibrate 160 MG tablet TAKE 1 TABLET DAILY Qty: 30 tablet, Refills: 2    furosemide (LASIX) 40 MG tablet Take 40 mg by mouth daily. Can take 1 extra tablet by mouth daily as needed for swelling/edema    gabapentin (NEURONTIN) 300 MG capsule Take 300 mg by mouth at bedtime.     insulin aspart protamine- aspart (NOVOLOG MIX 70/30) (70-30) 100 UNIT/ML injection Inject 25-30 Units into the skin 2 (  two) times daily with a meal.     losartan (COZAAR) 50 MG tablet TAKE 1 TABLET DAILY Qty: 90 tablet, Refills: 0    metFORMIN (GLUCOPHAGE) 500 MG tablet Take 500 mg by mouth 2 (two) times daily with a meal.     metoprolol succinate (TOPROL-XL) 100 MG 24 hr tablet Take 1 tablet (100 mg total) by mouth daily. Take with or immediately following a meal. Qty: 30 tablet, Refills: 6    NEOMYCIN-POLYMYXIN-HYDROCORTISONE (CORTISPORIN) 1 % SOLN otic solution Place 4 drops into both ears 2 (two) times daily as needed. For pain and itching    omega-3 acid ethyl esters (LOVAZA) 1 g capsule Take 2 capsules (2 g total) by mouth 2 (two) times daily. Qty: 120 capsule, Refills: 11    potassium chloride (K-DUR,KLOR-CON) 10 MEQ tablet Take 10 mEq by mouth every morning.         Outstanding Labs/Studies    Duration of Discharge Encounter   Greater than 30 minutes including physician time.  Janee Morn PA-C  06/08/2015, 4:37 PM

## 2015-06-08 NOTE — Progress Notes (Addendum)
Patient Profile: 70 y/o morbidly obese female from [facility], followed by Dr Dannielle Burn in the past, now Dr Claiborne Billings. She has a history of CABG x 2 with MAZE in 2005. Myoview in July 2014 was low risk. Echo in 2014 showed EF to be 55-60% with mild AS. In March 2016 she presented with AF with RVR and had a long pause with conversion. She underwent placement of a MDT dual chambner pacemaker. On chronic anticoagulation with Eliquis but admits to issues with compliance. Admitted 06/07/15 with symptomatic atrial fibrillation w/ RVR.   Subjective: Spontaneous conversion to NSR overnight. Symptoms resolved. Patient admits that she skipped a few doses of Cardizem at home. She also notes recent cold-like symptoms 1-2 weeks ago.   Objective: Vital signs in last 24 hours: Temp:  [97.6 F (36.4 C)-98.8 F (37.1 C)] 97.6 F (36.4 C) (03/24 0417) Pulse Rate:  [72-137] 83 (03/24 0417) Resp:  [12-26] 18 (03/24 0417) BP: (97-130)/(53-91) 114/53 mmHg (03/24 0417) SpO2:  [93 %-100 %] 96 % (03/24 0417) Weight:  [232 lb 11.2 oz (105.552 kg)] 232 lb 11.2 oz (105.552 kg) (03/23 1815) Last BM Date: 06/07/15  Intake/Output from previous day: 03/23 0701 - 03/24 0700 In: -  Out: 300 [Urine:300] Intake/Output this shift:    Medications Current Facility-Administered Medications  Medication Dose Route Frequency Provider Last Rate Last Dose  . 0.9 %  sodium chloride infusion  250 mL Intravenous PRN Erlene Quan, PA-C      . acetaminophen (TYLENOL) tablet 650 mg  650 mg Oral Q4H PRN Erlene Quan, PA-C      . ALPRAZolam Duanne Moron) tablet 0.25 mg  0.25 mg Oral BID PRN Erlene Quan, PA-C      . apixaban (ELIQUIS) tablet 5 mg  5 mg Oral BID Erlene Quan, PA-C   5 mg at 06/07/15 2255  . aspirin EC tablet 81 mg  81 mg Oral Daily Luke K Kilroy, PA-C      . atorvastatin (LIPITOR) tablet 10 mg  10 mg Oral q1800 Doreene Burke Kilroy, PA-C      . diltiazem (CARDIZEM) 100 mg in dextrose 5 % 100 mL (1 mg/mL) infusion  5-15 mg/hr  Intravenous Continuous Recardo Evangelist, PA-C 12.5 mL/hr at 06/08/15 0155 12.5 mg/hr at 06/08/15 0155  . fenofibrate tablet 160 mg  160 mg Oral Daily Luke K Kilroy, PA-C      . furosemide (LASIX) injection 20 mg  20 mg Intravenous Once Skeet Latch, MD   Stopped at 06/07/15 1358  . gabapentin (NEURONTIN) capsule 300 mg  300 mg Oral QHS Erlene Quan, PA-C   300 mg at 06/07/15 2255  . insulin aspart (novoLOG) injection 0-15 Units  0-15 Units Subcutaneous TID WC Erlene Quan, PA-C   3 Units at 06/08/15 [phone]  . insulin aspart (novoLOG) injection 0-5 Units  0-5 Units Subcutaneous QHS Doreene Burke [place], PA-C   2 Units at 06/07/15 2259  . insulin aspart protamine- aspart (NOVOLOG MIX 70/30) injection 25 Units  25 Units Subcutaneous BID WC Luke K Kilroy, PA-C      . losartan (COZAAR) tablet 50 mg  50 mg Oral Daily Luke K Kilroy, PA-C      . metFORMIN (GLUCOPHAGE) tablet 500 mg  500 mg Oral BID WC Luke K Kilroy, PA-C      . metoprolol succinate (TOPROL-XL) 24 hr tablet 100 mg  100 mg Oral Daily [employer], PA-C      .  nitroGLYCERIN (NITROSTAT) SL tablet 0.4 mg  0.4 mg Sublingual Q5 Min x 3 PRN Doreene Burke Kilroy, PA-C      . omega-3 acid ethyl esters (LOVAZA) capsule 2 g  2 g Oral BID Erlene Quan, PA-C   2 g at 06/07/15 2255  . ondansetron (ZOFRAN) injection 4 mg  4 mg Intravenous Q6H PRN Doreene Burke Kilroy, PA-C      . potassium chloride SA (K-DUR,KLOR-CON) CR tablet 10 mEq  10 mEq Oral q morning - 10a Luke K Kilroy, PA-C      . sodium chloride flush (NS) 0.9 % injection 3 mL  3 mL Intravenous Q12H Erlene Quan, PA-C   3 mL at 06/07/15 1252  . sodium chloride flush (NS) 0.9 % injection 3 mL  3 mL Intravenous PRN Luke K Kilroy, PA-C      . zolpidem (AMBIEN) tablet 5 mg  5 mg Oral QHS PRN Erlene Quan, PA-C        PE: General appearance: alert, cooperative, no distress and moderately obese Neck: no carotid bruit and no JVD Lungs: clear to auscultation bilaterally Heart: regular rate and rhythm, S1, S2  normal, no murmur, click, rub or gallop Extremities: no LEE Pulses: 2+ and symmetric Skin: warm and dry Neurologic: Grossly normal  Lab Results:   Recent Labs  06/07/15 0841  WBC 8.4  HGB 12.3  HCT 37.2  PLT 283   BMET  Recent Labs  06/07/15 0841 06/08/15 0500  NA 138 139  K 4.0 3.6  CL 104 106  CO2 24 25  GLUCOSE 202* 172*  BUN 17 12  CREATININE 0.74 0.78  CALCIUM 9.2 8.9   Cardiac Panel (last 3 results) No results for input(s): CKTOTAL, CKMB, TROPONINI, RELINDX in the last 72 hours.    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 Atrial Fibrillation w/ RVR:  TSH and K are both WNL. Patient spontaneously converted to NSR overnight. HR is controlled in the 70s-80s. Patient admits that she has not been fully compliant with diltiazem at home and also had recent cold/URI last week. Medication noncompliance in the setting of recent viral illness may be possible cause of exacerbation. We discussed the importance of full medication compliance and avoidance of triggers. We will restart her home diltiazem and will continue to run IV drip x 2 hrs to allow PO dose to take affect. Continue metoprolol and Eliquis. Ambulate with PT today. Possible d/c home this afternoon if rate and rhythm remain stable. F/u with Dr. Claiborne Billings.      LOS: 1 day    Brittainy M. Ladoris Gene 06/08/2015 7:55 AM    Addendum:   Ms. Delene Ruffini heart rate is now much better-controlled.  Her diltiazem infusion  was up to 12.43m/hr, which is 300 mg daily.  We will increase her home dose of 1224mto 240 mg.  BP has been slightly above goal, so this should help as well.  Continue losartan and Apixaban.  We discussed the  importance of taking all medications as prescribed and not missing her diltiazem or Apixaban.  If her HR remains well-controlled on PO diltiazem, plan to discharge home this afternoon.   Anis Cinelli C. Oval Linsey, MD, [facility]  06/08/2015  12:25 PM

## 2015-06-13 ENCOUNTER — Other Ambulatory Visit: Payer: Self-pay | Admitting: *Deleted

## 2015-06-13 MED ORDER — ATORVASTATIN CALCIUM 10 MG PO TABS: 10.0000 mg | ORAL_TABLET | Freq: Every day | ORAL | Status: DC

## 2015-06-18 ENCOUNTER — Other Ambulatory Visit: Payer: Self-pay | Admitting: *Deleted

## 2015-06-20 MED ORDER — ATORVASTATIN CALCIUM 10 MG PO TABS: 10.0000 mg | ORAL_TABLET | Freq: Every day | ORAL | Status: DC

## 2015-06-20 NOTE — Telephone Encounter (Signed)
Rx(s) sent to pharmacy electronically.  

## 2015-06-27 ENCOUNTER — Ambulatory Visit: Payer: Medicare Other | Admitting: Cardiology

## 2015-07-02 ENCOUNTER — Other Ambulatory Visit: Payer: Self-pay | Admitting: Cardiovascular Disease

## 2015-07-02 NOTE — Telephone Encounter (Signed)
Rx(s) sent to pharmacy electronically.  

## 2015-07-09 ENCOUNTER — Other Ambulatory Visit: Payer: Self-pay | Admitting: Cardiovascular Disease

## 2015-07-09 NOTE — Telephone Encounter (Signed)
Rx request sent to pharmacy.  

## 2015-08-06 ENCOUNTER — Other Ambulatory Visit: Payer: Self-pay | Admitting: Cardiovascular Disease

## 2015-08-07 NOTE — Telephone Encounter (Signed)
Rx(s) sent to pharmacy electronically.

## 2015-09-04 ENCOUNTER — Other Ambulatory Visit: Payer: Self-pay

## 2015-09-04 ENCOUNTER — Emergency Department (HOSPITAL_COMMUNITY): Payer: Medicare Other

## 2015-09-04 ENCOUNTER — Encounter (HOSPITAL_COMMUNITY): Payer: Self-pay

## 2015-09-04 ENCOUNTER — Observation Stay (HOSPITAL_COMMUNITY)
Admission: EM | Admit: 2015-09-04 | Discharge: 2015-09-05 | Disposition: A | Discharge status: Home / Self Care | Payer: Medicare Other | Attending: Interventional Cardiology | Admitting: Interventional Cardiology

## 2015-09-04 DIAGNOSIS — Z794 Long term (current) use of insulin: Secondary | ICD-10-CM | POA: Diagnosis not present

## 2015-09-04 DIAGNOSIS — J810 Acute pulmonary edema: Secondary | ICD-10-CM | POA: Diagnosis not present

## 2015-09-04 DIAGNOSIS — I4891 Unspecified atrial fibrillation: Secondary | ICD-10-CM | POA: Diagnosis not present

## 2015-09-04 DIAGNOSIS — Z7982 Long term (current) use of aspirin: Secondary | ICD-10-CM | POA: Insufficient documentation

## 2015-09-04 DIAGNOSIS — Z951 Presence of aortocoronary bypass graft: Secondary | ICD-10-CM | POA: Insufficient documentation

## 2015-09-04 DIAGNOSIS — Z8673 Personal history of transient ischemic attack (TIA), and cerebral infarction without residual deficits: Secondary | ICD-10-CM | POA: Diagnosis not present

## 2015-09-04 DIAGNOSIS — Z950 Presence of cardiac pacemaker: Secondary | ICD-10-CM | POA: Diagnosis not present

## 2015-09-04 DIAGNOSIS — R002 Palpitations: Secondary | ICD-10-CM

## 2015-09-04 DIAGNOSIS — Z7984 Long term (current) use of oral hypoglycemic drugs: Secondary | ICD-10-CM | POA: Insufficient documentation

## 2015-09-04 DIAGNOSIS — I509 Heart failure, unspecified: Secondary | ICD-10-CM | POA: Diagnosis not present

## 2015-09-04 DIAGNOSIS — E119 Type 2 diabetes mellitus without complications: Secondary | ICD-10-CM | POA: Diagnosis not present

## 2015-09-04 DIAGNOSIS — I2510 Atherosclerotic heart disease of native coronary artery without angina pectoris: Secondary | ICD-10-CM

## 2015-09-04 DIAGNOSIS — Z7901 Long term (current) use of anticoagulants: Secondary | ICD-10-CM

## 2015-09-04 DIAGNOSIS — Z79899 Other long term (current) drug therapy: Secondary | ICD-10-CM | POA: Insufficient documentation

## 2015-09-04 DIAGNOSIS — Z8542 Personal history of malignant neoplasm of other parts of uterus: Secondary | ICD-10-CM | POA: Diagnosis not present

## 2015-09-04 DIAGNOSIS — I110 Hypertensive heart disease with heart failure: Secondary | ICD-10-CM | POA: Insufficient documentation

## 2015-09-04 DIAGNOSIS — D68318 Other hemorrhagic disorder due to intrinsic circulating anticoagulants, antibodies, or inhibitors: Secondary | ICD-10-CM | POA: Insufficient documentation

## 2015-09-04 HISTORY — DX: Sleep apnea, unspecified: G47.30

## 2015-09-04 HISTORY — DX: Pneumonia, unspecified organism: J18.9

## 2015-09-04 HISTORY — DX: Calculus of kidney: N20.0

## 2015-09-04 HISTORY — DX: Presence of cardiac pacemaker: Z95.0

## 2015-09-04 HISTORY — DX: Type 2 diabetes mellitus without complications: E11.9

## 2015-09-04 HISTORY — DX: Heart failure, unspecified: I50.9

## 2015-09-04 HISTORY — DX: Unspecified asthma, uncomplicated: J45.909

## 2015-09-04 LAB — TROPONIN I: Troponin I: <0.03 ng/mL (ref <0.031)

## 2015-09-04 LAB — CBC
HCT: 39.3 % (ref 36.0–46.0)
HEMOGLOBIN: 12.7 g/dL (ref 12.0–15.0)
MCH: 29.5 pg (ref 26.0–34.0)
MCHC: 32.3 g/dL (ref 30.0–36.0)
MCV: 91.4 fL (ref 78.0–100.0)
Platelets: 306 10*3/uL (ref 150–400)
RBC: 4.3 MIL/uL (ref 3.87–5.11)
RDW: 14.4 % (ref 11.5–15.5)
WBC: 10.2 10*3/uL (ref 4.0–10.5)

## 2015-09-04 LAB — GLUCOSE, CAPILLARY
GLUCOSE-CAPILLARY: 237 mg/dL — AB (ref 65–99)
Glucose-Capillary: 369 mg/dL — ABNORMAL HIGH (ref 65–99)

## 2015-09-04 LAB — BASIC METABOLIC PANEL
ANION GAP: 9 (ref 5–15)
BUN: 14 mg/dL (ref 6–20)
CHLORIDE: 101 mmol/L (ref 101–111)
CO2: 25 mmol/L (ref 22–32)
Calcium: 9.3 mg/dL (ref 8.9–10.3)
Creatinine, Ser: 0.85 mg/dL (ref 0.44–1.00)
GFR calc non Af Amer: >60 mL/min (ref >60)
Glucose, Bld: 249 mg/dL — ABNORMAL HIGH (ref 65–99)
Potassium: 3.9 mmol/L (ref 3.5–5.1)
Sodium: 135 mmol/L (ref 135–145)

## 2015-09-04 LAB — PROTIME-INR
INR: 1.09 (ref 0.00–1.49)
Prothrombin Time: 14.3 seconds (ref 11.6–15.2)

## 2015-09-04 LAB — MAGNESIUM: MAGNESIUM: 1.4 mg/dL — AB (ref 1.7–2.4)

## 2015-09-04 LAB — TSH: TSH: 3.281 u[IU]/mL (ref 0.350–4.500)

## 2015-09-04 MED ORDER — ATORVASTATIN CALCIUM 10 MG PO TABS
10.0000 mg | ORAL_TABLET | Freq: Every day | ORAL | Status: DC
  Administered 2015-09-05: 10 mg via ORAL
  Filled 2015-09-04: qty 1

## 2015-09-04 MED ORDER — INSULIN ASPART 100 UNIT/ML ~~LOC~~ SOLN
0.0000 [IU] | Freq: Every day | SUBCUTANEOUS | Status: DC
  Administered 2015-09-04: 5 [IU] via SUBCUTANEOUS

## 2015-09-04 MED ORDER — METFORMIN HCL 500 MG PO TABS
500.0000 mg | ORAL_TABLET | Freq: Two times a day (BID) | ORAL | Status: DC
  Administered 2015-09-04 – 2015-09-05 (×2): 500 mg via ORAL
  Filled 2015-09-04 (×2): qty 1

## 2015-09-04 MED ORDER — METOPROLOL SUCCINATE ER 100 MG PO TB24
100.0000 mg | ORAL_TABLET | Freq: Every day | ORAL | Status: DC
  Administered 2015-09-04 – 2015-09-05 (×2): 100 mg via ORAL
  Filled 2015-09-04 (×2): qty 1

## 2015-09-04 MED ORDER — DILTIAZEM HCL 100 MG IV SOLR
5.0000 mg/h | INTRAVENOUS | Status: DC
  Administered 2015-09-04: 12.5 mg/h via INTRAVENOUS
  Administered 2015-09-05: 5 mg/h via INTRAVENOUS
  Filled 2015-09-04 (×2): qty 100

## 2015-09-04 MED ORDER — GABAPENTIN 300 MG PO CAPS
300.0000 mg | ORAL_CAPSULE | Freq: Every day | ORAL | Status: DC
  Administered 2015-09-04: 300 mg via ORAL
  Filled 2015-09-04: qty 1

## 2015-09-04 MED ORDER — INSULIN ASPART 100 UNIT/ML ~~LOC~~ SOLN
0.0000 [IU] | Freq: Three times a day (TID) | SUBCUTANEOUS | Status: DC
  Administered 2015-09-04: 5 [IU] via SUBCUTANEOUS

## 2015-09-04 MED ORDER — OMEGA-3-ACID ETHYL ESTERS 1 G PO CAPS
2.0000 g | ORAL_CAPSULE | Freq: Two times a day (BID) | ORAL | Status: DC
  Administered 2015-09-04 – 2015-09-05 (×2): 2 g via ORAL
  Filled 2015-09-04 (×2): qty 2

## 2015-09-04 MED ORDER — ACETAMINOPHEN 500 MG PO TABS: 500.0000 mg | ORAL_TABLET | Freq: Four times a day (QID) | ORAL | Status: DC | PRN

## 2015-09-04 MED ORDER — APIXABAN 5 MG PO TABS
5.0000 mg | ORAL_TABLET | Freq: Two times a day (BID) | ORAL | Status: DC
  Administered 2015-09-04 – 2015-09-05 (×2): 5 mg via ORAL
  Filled 2015-09-04 (×7): qty 1

## 2015-09-04 MED ORDER — INSULIN ASPART 100 UNIT/ML ~~LOC~~ SOLN
0.0000 [IU] | Freq: Three times a day (TID) | SUBCUTANEOUS | Status: DC
  Administered 2015-09-05 (×2): 11 [IU] via SUBCUTANEOUS

## 2015-09-04 MED ORDER — FENOFIBRATE 160 MG PO TABS
160.0000 mg | ORAL_TABLET | Freq: Every day | ORAL | Status: DC
  Administered 2015-09-05: 160 mg via ORAL
  Filled 2015-09-04: qty 1

## 2015-09-04 MED ORDER — ONDANSETRON HCL 4 MG/2ML IJ SOLN: 4.0000 mg | Freq: Four times a day (QID) | INTRAMUSCULAR | Status: DC | PRN

## 2015-09-04 MED ORDER — DILTIAZEM HCL 25 MG/5ML IV SOLN
20.0000 mg | Freq: Once | INTRAVENOUS | Status: AC
  Administered 2015-09-04: 20 mg via INTRAVENOUS
  Filled 2015-09-04: qty 5

## 2015-09-04 MED ORDER — FUROSEMIDE 10 MG/ML IJ SOLN
60.0000 mg | Freq: Once | INTRAMUSCULAR | Status: AC
  Administered 2015-09-04: 60 mg via INTRAVENOUS
  Filled 2015-09-04: qty 6

## 2015-09-04 MED ORDER — AMIODARONE HCL 200 MG PO TABS
400.0000 mg | ORAL_TABLET | Freq: Two times a day (BID) | ORAL | Status: DC
  Administered 2015-09-04 – 2015-09-05 (×3): 400 mg via ORAL
  Filled 2015-09-04 (×3): qty 2

## 2015-09-04 MED ORDER — LOSARTAN POTASSIUM 50 MG PO TABS
50.0000 mg | ORAL_TABLET | Freq: Every day | ORAL | Status: DC
  Administered 2015-09-05: 50 mg via ORAL
  Filled 2015-09-04: qty 1

## 2015-09-04 MED ORDER — VITAMIN D3 25 MCG (1000 UNIT) PO TABS
1000.0000 [IU] | ORAL_TABLET | Freq: Every day | ORAL | Status: DC
  Administered 2015-09-05: 1000 [IU] via ORAL
  Filled 2015-09-04 (×2): qty 1

## 2015-09-04 MED ORDER — DEXTROSE 5 % IV SOLN
5.0000 mg/h | Freq: Once | INTRAVENOUS | Status: AC
  Administered 2015-09-04: 5 mg/h via INTRAVENOUS
  Filled 2015-09-04: qty 100

## 2015-09-04 MED ORDER — POTASSIUM CHLORIDE CRYS ER 20 MEQ PO TBCR
20.0000 meq | EXTENDED_RELEASE_TABLET | Freq: Two times a day (BID) | ORAL | Status: DC
  Administered 2015-09-04 – 2015-09-05 (×2): 20 meq via ORAL
  Filled 2015-09-04 (×2): qty 1

## 2015-09-04 MED ORDER — FUROSEMIDE 10 MG/ML IJ SOLN
40.0000 mg | Freq: Two times a day (BID) | INTRAMUSCULAR | Status: DC
  Administered 2015-09-04 – 2015-09-05 (×2): 40 mg via INTRAVENOUS
  Filled 2015-09-04 (×2): qty 4

## 2015-09-04 MED ORDER — ASPIRIN EC 81 MG PO TBEC
81.0000 mg | DELAYED_RELEASE_TABLET | Freq: Once | ORAL | Status: AC
  Administered 2015-09-04: 81 mg via ORAL
  Filled 2015-09-04: qty 1

## 2015-09-04 MED ORDER — ASPIRIN EC 81 MG PO TBEC
81.0000 mg | DELAYED_RELEASE_TABLET | Freq: Every day | ORAL | Status: DC
  Administered 2015-09-05: 81 mg via ORAL
  Filled 2015-09-04: qty 1

## 2015-09-04 MED ORDER — ACETAMINOPHEN 325 MG PO TABS: 650.0000 mg | ORAL_TABLET | ORAL | Status: DC | PRN

## 2015-09-04 NOTE — ED Notes (Signed)
Attempted report.

## 2015-09-04 NOTE — ED Notes (Signed)
Pt. Coming from home via POV for palpitations starting last night at 9pm. Pt. Hx of afib, but reports she always converts on her own. Pt. Is on eliquis. Pt. Hr 132bpm on monitor. EKG shot.

## 2015-09-04 NOTE — ED Notes (Signed)
Pt. Transported to xray at this time.  

## 2015-09-04 NOTE — ED Provider Notes (Signed)
CSN: [id number]     Arrival date & time 09/04/15  0706 History   First MD Initiated Contact with Patient 09/04/15 0715     Chief Complaint  Patient presents with  . Palpitations  PT IS A 70 YO WF WITH A HX OF A.FIB (CHADVASC SCORE 8).  SHE IS ON ELIQUIS.  SHE SAID THAT SHE DRANK SOME APPLE JUICE AROUND 2130 PM.  SHE SAID THAT AFTER THAT, HER HEART WENT "CRAZY."  SHE HAS NOT SKIPPED ANY OF HER MEDS.  SHE SAID THAT HER HEART WAS GOING FAST ALL NIGHT.  SHE TOOK A 50 MG METOPROLOL AROUND 0200 TO SEE IF THAT WOULD HELP AND IT DID NOT.  SHE DENIES ANY CP, BUT DOES HAVE SOB ASSOCIATED WITH IT.   (Consider location/radiation/quality/duration/timing/severity/associated sxs/prior Treatment) Patient is a 70 y.o. female presenting with palpitations. The history is provided by the patient and a relative.  Palpitations Palpitations quality:  Irregular Onset quality:  Sudden Timing:  Constant Progression:  Unchanged Chronicity:  Recurrent Relieved by:  Nothing Ineffective treatments:  Beta blockers and bed rest Associated symptoms: shortness of breath   Risk factors: diabetes mellitus, heart disease and hx of atrial fibrillation     Past Medical History  Diagnosis Date  . Hyperlipidemia   . TIA (transient ischemic attack)   . Paroxysmal atrial fibrillation ([place])     a. s/p Maze in 2005 b. recurrence in March 2016 with >8sec posttermination pauses s/p PPM implant    . Mixed hyperlipidemia   . Coronary atherosclerosis of native coronary artery     a. s/p CABG 2005 Dr Roxy Manns    . Hypertensive heart disease   . Hypertension   . Uterine cancer [facility])     age 35 with partial hysterectomy   Past Surgical History  Procedure Laterality Date  . Abdominal hysterectomy    . Coronary artery bypass graft  2005    Dr Roxy Manns with MAZE  . Cholecystectomy    . 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 Cystoscopy with  ureteroscopy, stone basketry and stent placement Left 09/08/2014    Procedure: CYSTOSCOPY WITH URETEROSCOPY, [place];  Surgeon: Cleon Gustin, MD;  Location: [facility];  Service: Urology;  Laterality: Left;  . Holmium laser application Left 2/70/6237    Procedure: HOLMIUM LASER APPLICATION;  Surgeon: Cleon Gustin, MD;  Location: [facility];  Service: Urology;  Laterality: Left;  . Cystoscopy/ureteroscopy/holmium laser/stent placement Left 09/15/2014    Procedure: CYSTOSCOPY/RETROGRADE/URETEROSCOPY/HOLMIUM LASER/ STONE EXTRACTION /STENT EXCHANGE;  Surgeon: Cleon Gustin, MD;  Location: [facility];  Service: Urology;  Laterality: Left;  . Holmium laser application Left 08/16/8313    Procedure: HOLMIUM LASER APPLICATION;  Surgeon: Cleon Gustin, MD;  Location: [facility];  Service: Urology;  Laterality: Left;   Family History  Problem Relation Age of Onset  . Hypertension Mother   . Hyperlipidemia Mother   . Heart disease Mother   . Diabetes Father   . Stroke Father   . Heart disease Father   . Hypertension Sister     3 sisters  . Arthritis Brother   . Heart attack Maternal Grandfather   . Stroke Paternal Grandfather   . Hypertension Daughter    Social History  Substance Use Topics  . Smoking status: Never Smoker   . Smokeless tobacco: Never Used  . Alcohol Use: No   OB History  No data available     Review of Systems  Respiratory: Positive for shortness of breath.   Cardiovascular: Positive for palpitations.  All other systems reviewed and are negative.     Allergies  Review of patient's allergies indicates no known allergies.  Home Medications   Prior to Admission medications   Medication Sig Start Date End Date Taking? Authorizing Provider  acetaminophen (TYLENOL) 500 MG tablet Take 500-1,000 mg by mouth every 6 (six) hours as needed for moderate pain.    Historical Provider, MD  apixaban (ELIQUIS) 5 MG TABS tablet Take 1 tablet (5 mg  total) by mouth 2 (two) times daily. Take 1st dose 06-07-14 06/06/14   Patsey Berthold, NP  aspirin 81 MG tablet Take 81 mg by mouth once.    Historical Provider, MD  atorvastatin (LIPITOR) 10 MG tablet Take 1 tablet (10 mg total) by mouth daily. 06/20/15   Troy Sine, MD  B Complex-Biotin-FA (B-COMPLEX PO) Take 1 capsule by mouth every morning.     Historical Provider, MD  Cholecalciferol (VITAMIN D3) 1000 UNITS CAPS Take 1,000 Units by mouth daily.    Historical Provider, MD  diltiazem (CARDIZEM CD) 240 MG 24 hr capsule Take 1 capsule (240 mg total) by mouth at bedtime. 06/08/15   Brittainy M Simmons, PA-C  diphenhydramine-acetaminophen (TYLENOL PM) 25-500 MG TABS tablet Take 1 tablet by mouth at bedtime as needed (sleep).    Historical Provider, MD  fenofibrate 160 MG tablet TAKE 1 TABLET DAILY 07/09/15   Troy Sine, MD  furosemide (LASIX) 40 MG tablet Take 40 mg by mouth daily. Can take 1 extra tablet by mouth daily as needed for swelling/edema    Historical Provider, MD  gabapentin (NEURONTIN) 300 MG capsule Take 300 mg by mouth at bedtime.  07/05/14   Historical Provider, MD  insulin aspart protamine- aspart (NOVOLOG MIX 70/30) (70-30) 100 UNIT/ML injection Inject 25-30 Units into the skin 2 (two) times daily with a meal.     Historical Provider, MD  losartan (COZAAR) 50 MG tablet Take 1 tablet (50 mg total) by mouth daily. 07/02/15   Troy Sine, MD  metFORMIN (GLUCOPHAGE) 500 MG tablet Take 500 mg by mouth 2 (two) times daily with a meal.     Historical Provider, MD  metoprolol succinate (TOPROL-XL) 100 MG 24 hr tablet TAKE 1 TABLET ONCE DAILY AFTER MEAL 08/07/15   Troy Sine, MD  NEOMYCIN-POLYMYXIN-HYDROCORTISONE (CORTISPORIN) 1 % SOLN otic solution Place 4 drops into both ears 2 (two) times daily as needed. For pain and itching 04/04/14   Historical Provider, MD  omega-3 acid ethyl esters (LOVAZA) 1 g capsule Take 2 capsules (2 g total) by mouth 2 (two) times daily. 05/31/15   Troy Sine, MD  potassium chloride (K-DUR,KLOR-CON) 10 MEQ tablet Take 10 mEq by mouth every morning.     Historical Provider, MD   BP 125/73 mmHg  Pulse 101  Temp(Src) 98.1 F (36.7 C) (Oral)  Resp 18  Ht _0  (1.676 m)  Wt 232 lb (105.235 kg)  BMI 37.46 kg/m2  SpO2 94% Physical Exam  Constitutional: She is oriented to person, place, and time. She appears well-developed and well-nourished.  HENT:  Head: Normocephalic and atraumatic.  Right Ear: External ear normal.  Left Ear: External ear normal.  Nose: Nose normal.  Mouth/Throat: Oropharynx is clear and moist.  Eyes: Conjunctivae and EOM are normal. Pupils are equal, round, and reactive to light.  Neck: Normal range  of motion. Neck supple.  Cardiovascular: An irregularly irregular rhythm present. Tachycardia present.   Pulmonary/Chest: Effort normal and breath sounds normal.  Abdominal: Soft. Bowel sounds are normal.  Musculoskeletal: Normal range of motion.  Neurological: She is alert and oriented to person, place, and time.  Skin: Skin is warm and dry.  Psychiatric: She has a normal mood and affect. Her behavior is normal. Judgment and thought content normal.  Nursing note and vitals reviewed.   ED Course  Procedures (including critical care time) Labs Review Labs Reviewed  BASIC METABOLIC PANEL - Abnormal; Notable for the following:    Glucose, Bld 249 (*)    All other components within normal limits  MAGNESIUM - Abnormal; Notable for the following:    Magnesium 1.4 (*)    All other components within normal limits  PROTIME-INR  TROPONIN I  CBC  TSH    Imaging Review Dg Chest 2 View  09/04/2015  CLINICAL DATA:  Shortness of breath beginning last night. Atrial fibrillation. EXAM: CHEST  2 VIEW COMPARISON:  PA and lateral chest 06/07/2015 and 05/23/2010. CT chest 05/23/2010. FINDINGS: There is cardiomegaly and pulmonary edema. No pneumothorax or pleural effusion. The patient is status post CABG with a pacing device in  place. IMPRESSION: Cardiomegaly and pulmonary edema. Electronically Signed   By: Inge Rise M.D.   On: 09/04/2015 07:51   I have personally reviewed and evaluated these images and lab results as part of my medical decision-making.   EKG Interpretation   Date/Time:  Tuesday September 04 2015 07:11:49 EDT Ventricular Rate:  129 PR Interval:    QRS Duration: 96 QT Interval:  285 QTC Calculation: 418 R Axis:   40 Text Interpretation:  Atrial fibrillation Low voltage, precordial leads  Borderline repolarization abnormality Confirmed by Temesha Queener MD, Nasire Reali  ([id number]) on 09/04/2015 7:58:55 AM      MDM  1 DOSE OF 20 MG CARDIZEM LOWERED HR TO UPPER 90S, BUT WHEN PT GETS UP TO MOVE, HER HR GOES RIGHT BACK UP.  PT PLACED ON A CARDIZEM DRIP.  THE PT WAS D/W DR. Evette Georges PA WHO WILL COME AND EVAL PT. Final diagnoses:  Atrial fibrillation with RVR ([place])  Chronic anticoagulation      Isla Pence, MD 09/04/15 [phone]

## 2015-09-04 NOTE — H&P (Signed)
Cardiology H & P     Patient ID: [name] MRN: [id number], DOB/AGE: October 08, 1945   Admit date: 09/04/2015 Date of Consult: 09/04/2015  Primary Physician: [name], DO Reason for Consult: Afib RVR Primary Cardiologist: Dr. Claiborne Billings Requesting Provider: Dr. Gilford Raid  Patient Profile  [name] is a 70 year old female with a past medical history of HLD, TIA, PAF s/p MDT PPM on Eliquis, CAD s/p CABG in 2005 with maze procedure, and HTN. She presented to the ED on 09/04/15 with palpitations and found to be in Afib with rate in 120-130's.   History of Present Illness  [name] says that last night she drank some apple juice around 9:30 pm and shortly after she felt her heart start palpating. She felt like her heart was "going crazy" and developed some chest pressure as well. She took an extra 14m Metoprolol to see if that would help. She did not notice any improvement so she came to the ED.   EKG in ED showed Afib RVR with rate of 129. She was started on IV diltiazem and her rate initially improved with reduction of rate to the 90's however currently it is in the 100-120 range.  She was hospitalized recently in March of this year with Afib RVR after missing several doses of her Cardizem. She converted to NSR on IV Cardizem and her home po Cardizem was increased to 2416mdaily. She states that since the increase of her Cardizem she has noticed increasing shortness of breath with activity. In the past 3 months she has not been able to lay flat for sleep, she uses 3 pillows for sleep. She does describe some PND.  She also endorses increasing lower extremity edema.  She is minimally active at home. Does not get regular exercise. She is a nonsmoker, denies drug and alcohol use.  Past Medical History   Past Medical History  Diagnosis Date  . Hyperlipidemia   . TIA (transient ischemic attack)   . Paroxysmal atrial fibrillation ([place]    a. s/p Maze in 2005 b. recurrence in March  2016 with >8sec posttermination pauses s/p PPM implant    . Mixed hyperlipidemia   . Coronary atherosclerosis of native coronary artery     a. s/p CABG 2005 Dr OwRoxy Manns  . Hypertensive heart disease   . Hypertension   [facility]    age 89ith partial hysterectomy    Past Surgical History  Procedure Laterality Date  . Abdominal hysterectomy    . Coronary artery bypass graft  2005    Dr OwRoxy Mannsith MAZE  . Cholecystectomy    . Permanent pacemaker insertion N/A 06/05/2014    MDT Advisa dual chamber pacemaker implanted by Dr TaLovena Leor tachy-brady syndrome  . Cystoscopy with ureteroscopy, stone basketry and stent placement Left 09/08/2014    Procedure: CYSTOSCOPY WITH URETEROSCOPY, [place] Surgeon: PaCleon GustinMD;  Location: [facility];  Service: Urology;  Laterality: Left;  . Holmium laser application Left 08/16/85/8676  Procedure: HOLMIUM LASER APPLICATION;  Surgeon: PaCleon GustinMD;  Location[facility];  Service: Urology;  Laterality: Left;  . Cystoscopy/ureteroscopy/holmium laser/stent placement Left 09/15/2014    Procedure: CYSTOSCOPY/RETROGRADE/URETEROSCOPY/HOLMIUM LASER/ STONE EXTRACTION /STENT EXCHANGE;  Surgeon: PaCleon GustinMD;  Location[facility];  Service: Urology;  Laterality: Left;  . Holmium laser application Left 7/09/15/945  Procedure: HOLMIUM LASER APPLICATION;  Surgeon: PaCandee Furbish  McKenzie, MD;  Location: [facility];  Service: Urology;  Laterality: Left;     Allergies  No Known Allergies        Family History    Family History  Problem Relation Age of Onset  . Hypertension Mother   . Hyperlipidemia Mother   . Heart disease Mother   . Diabetes Father   . Stroke Father   . Heart disease Father   . Hypertension Sister     3 sisters  . Arthritis Brother   . Heart attack Maternal Grandfather   . Stroke Paternal Grandfather   . Hypertension Daughter     Social History    Social History   Social History  .  Marital Status: Married    Spouse Name: N/A  . Number of Children: N/A  . Years of Education: N/A   Occupational History  . Not on file.   Social History Main Topics  . Smoking status: Never Smoker   . Smokeless tobacco: Never Used  . Alcohol Use: No  . Drug Use: No  . Sexual Activity: Not on file   Other Topics Concern  . Not on file   Social History Narrative     Review of Systems    General:  No chills, fever, night sweats or weight changes.  Cardiovascular:  No chest pain, has dyspnea on exertion, +1-+2 pretibial edema, + orthopnea,  + palpitations, + paroxysmal nocturnal dyspnea. Dermatological: No rash, lesions/masses Respiratory: No cough, respirations slightly labored.  Urologic: No hematuria, dysuria Abdominal:   + nausea,  No vomiting or diarrhea, no bright red blood per rectum, melena, or hematemesis Neurologic:  No visual changes, wkns, changes in mental status. All other systems reviewed and are otherwise negative except as noted above.  Physical Exam    Blood pressure 125/73, pulse 101, temperature 98.1 F (36.7 C), temperature source Oral, resp. rate 18, height _0  (1.676 m), weight 232 lb (105.235 kg), SpO2 94 %.  General: Pleasant, obese female in NAD Psych: Normal affect. Neuro: Alert and oriented X 3. Moves all extremities spontaneously. HEENT: Normal  Neck: Supple without bruits or No JVD. Lungs:  Resp regular, labored  Heart: RRR no s3, s4, or murmurs. Abdomen: Soft, non-tender, non-distended, BS + x 4.  Extremities: No clubbing, cyanosis. +2 pretibial edema. DP/PT/Radials 2+ and equal bilaterally.  Labs     Recent Labs  09/04/15 0729  TROPONINI <0.03   Lab Results  Component Value Date   WBC 10.2 09/04/2015   HGB 12.7 09/04/2015   HCT 39.3 09/04/2015   MCV 91.4 09/04/2015   PLT 306 09/04/2015    Recent Labs Lab 09/04/15 0729  NA 135  K 3.9  CL 101  CO2 25  BUN 14  CREATININE 0.85  CALCIUM 9.3  GLUCOSE 249*       Radiology Studies    Dg Chest 2 View  09/04/2015  CLINICAL DATA:  Shortness of breath beginning last night. Atrial fibrillation. EXAM: CHEST  2 VIEW COMPARISON:  PA and lateral chest 06/07/2015 and 05/23/2010. CT chest 05/23/2010. FINDINGS: There is cardiomegaly and pulmonary edema. No pneumothorax or pleural effusion. The patient is status post CABG with a pacing device in place. IMPRESSION: Cardiomegaly and pulmonary edema. Electronically Signed   By: Inge Rise M.D.   On: 09/04/2015 07:51    EKG & Cardiac Imaging    EKG: Afib rate 129, ST segment flattening in anterior leads.   Echocardiogram: 10/05/12 Study Conclusions  - Left ventricle: The  cavity size was normal. Wall thickness was increased in a pattern of moderate LVH. Systolic function was normal. The estimated ejection fraction was in the range of 55% to 60%. Wall motion was normal; there were no regional wall motion abnormalities. The study is not technically sufficient to allow evaluation of LV diastolic function. The E/e' ratio is >10, suggesting elevated LV filling pressure. - Aortic valve: Sclerosis with very mild stenosis - peak and mean gradients of 20 mmHg and 10 mmHg, respectively. Trivial regurgitation. - Mitral valve: Posterior calcified annulus. Mild regurgitation. Valve area by pressure half-time: 1.55cm^2. - Left atrium: LA Volume/ BSA = 26.8 ml/m2 The atrium was at the upper limits of normal in size. - Tricuspid valve: Trivial regurgitation. - Pulmonary arteries: PA peak pressure: 74m Hg (S). - Inferior vena cava: The vessel was normal in size; the respirophasic diameter changes were in the normal range (= 50%); findings are consistent with normal central venous pressure.    Assessment & Plan    1. Paroxysmal atrial fibrillation: Patient has history of atrial fibrillation. She developed palpitations and chest pressure approximately 9:30 last night after drinking some  cold juice. Of note her recent hospitalization in May of this year was also preceded by drinking cold juice. She continues to be in rapid A. fib with rates greater than 100. She is on a diltiazem drip can titrate to keep heart rate less than 100. She has a [employer] device. Will interrogate to assess A. fib burden. Continue diltiazem drip for now. Can consider adding po Amiodarone as well.   This patients CHA2DS2-VASc Score and unadjusted Ischemic Stroke Rate (% per year) is equal to 11.2 % stroke rate/year from a score of 7 Above score calculated as 1 point each if present [CHF, HTN, DM, Vascular=MI/PAD/Aortic Plaque, Age if 65-74, or Female], 2 points each if present [Age > 75, or Stroke/TIA/TE]  She is on Eliquis.   2. Dyspnea on exertion: Patient describes increased shortness of breath at rest, and dyspnea with exertion since her home Cardizem was increased to 3 months ago. Her last echo was in 2014, report above, will repeat echo this admission. Need to reassess the use of calcium channel blocker if her EF is reduced.  3. History of sleep apnea: Patient has a history of sleep apnea. Had sleep study done in August 2014. At that time she was fitted for a CPAP machine however she did not have the funds to pay for the machine. I suspect that her sleep apnea is worsening her atrial fibrillation. She states that her financial situation has changed and would be open to repeating sleep study and obtaining CPAP machine.  4. Left shoulder pain: Mrs. GLingerfeltstates that her left shoulder has had decreased range of motion and pain since the insertion of her pacemaker. She cannot lay on that side as it causes her pain. She describes the pain as "nerve pain", with numbness and tingling radiating to her fingers. She has not had any formal workup for this. She takes Tylenol for the pain with relief. Can follow up with PCP about this with possible plans for imaging to further evaluate.  5. History of CAD s/p CABG:  Patient presents with chest pressure in the setting of atrial fibrillation with RVR. She denies having chest pain before this episode. She had a low risk nuclear study in July 2014.  6. DM: Last A1C was 9.4 one year ago. Insulin dependent. Will order sliding scale.   7. HLD: On low  intensity statin.Last lipid panel in chart from 2012,  will order for the am.     Signed, Arbutus Leas, NP 09/04/2015, 11:06 AM Pager: [phone]  I have examined the patient and reviewed assessment and plan and discussed with patient.  Agree with above as stated.  SHe has heart failure with AFib.  Several episodes in the past year.  Start Amio to try to maintain NSR.  Dilt for rate control.  Continue beta blocker.  Diurese with Lasix given pulmonary edema by chest xray. Check LFTs, TSH.  Will need regular CXRs. Cc: Dr. Claiborne Billings.  Larae Grooms

## 2015-09-04 NOTE — ED Notes (Signed)
Pt. Ambulated in hallway. Pt. Oxygen stayed at 97%, but heart rate increased to 122 bpm. EDP made aware.

## 2015-09-04 NOTE — ED Notes (Signed)
Attempted report 

## 2015-09-05 ENCOUNTER — Observation Stay (HOSPITAL_BASED_OUTPATIENT_CLINIC_OR_DEPARTMENT_OTHER): Payer: Medicare Other

## 2015-09-05 DIAGNOSIS — I110 Hypertensive heart disease with heart failure: Secondary | ICD-10-CM | POA: Diagnosis not present

## 2015-09-05 DIAGNOSIS — I2510 Atherosclerotic heart disease of native coronary artery without angina pectoris: Secondary | ICD-10-CM | POA: Diagnosis not present

## 2015-09-05 DIAGNOSIS — I509 Heart failure, unspecified: Secondary | ICD-10-CM

## 2015-09-05 DIAGNOSIS — I4891 Unspecified atrial fibrillation: Secondary | ICD-10-CM | POA: Diagnosis not present

## 2015-09-05 DIAGNOSIS — D68318 Other hemorrhagic disorder due to intrinsic circulating anticoagulants, antibodies, or inhibitors: Secondary | ICD-10-CM | POA: Diagnosis not present

## 2015-09-05 LAB — BASIC METABOLIC PANEL
ANION GAP: 9 (ref 5–15)
BUN: 16 mg/dL (ref 6–20)
CO2: 27 mmol/L (ref 22–32)
Calcium: 9.4 mg/dL (ref 8.9–10.3)
Chloride: 100 mmol/L — ABNORMAL LOW (ref 101–111)
Creatinine, Ser: 0.9 mg/dL (ref 0.44–1.00)
GFR calc Af Amer: >60 mL/min (ref >60)
GLUCOSE: 275 mg/dL — AB (ref 65–99)
POTASSIUM: 3.7 mmol/L (ref 3.5–5.1)
SODIUM: 136 mmol/L (ref 135–145)

## 2015-09-05 LAB — LIPID PANEL
CHOL/HDL RATIO: 4.5 ratio
CHOLESTEROL: 150 mg/dL (ref 0–200)
HDL: 33 mg/dL — AB (ref >40)
LDL Cholesterol: 70 mg/dL (ref 0–99)
TRIGLYCERIDES: 235 mg/dL — AB (ref <150)
VLDL: 47 mg/dL — ABNORMAL HIGH (ref 0–40)

## 2015-09-05 LAB — TSH: TSH: 2.088 u[IU]/mL (ref 0.350–4.500)

## 2015-09-05 LAB — GLUCOSE, CAPILLARY
GLUCOSE-CAPILLARY: 296 mg/dL — AB (ref 65–99)
Glucose-Capillary: 262 mg/dL — ABNORMAL HIGH (ref 65–99)
Glucose-Capillary: 288 mg/dL — ABNORMAL HIGH (ref 65–99)

## 2015-09-05 LAB — HEPATIC FUNCTION PANEL
ALK PHOS: 47 U/L (ref 38–126)
ALT: 24 U/L (ref 14–54)
AST: 33 U/L (ref 15–41)
Albumin: 3.7 g/dL (ref 3.5–5.0)
BILIRUBIN DIRECT: 0.2 mg/dL (ref 0.1–0.5)
BILIRUBIN INDIRECT: 0.3 mg/dL (ref 0.3–0.9)
BILIRUBIN TOTAL: 0.5 mg/dL (ref 0.3–1.2)
Total Protein: 6.8 g/dL (ref 6.5–8.1)

## 2015-09-05 LAB — HEMOGLOBIN A1C
Hgb A1c MFr Bld: 8.9 % — ABNORMAL HIGH (ref 4.8–5.6)
MEAN PLASMA GLUCOSE: 209 mg/dL

## 2015-09-05 LAB — ECHOCARDIOGRAM COMPLETE
HEIGHTINCHES: 66 in
Weight: 3766.4 oz

## 2015-09-05 MED ORDER — PERFLUTREN LIPID MICROSPHERE
INTRAVENOUS | Status: AC
  Filled 2015-09-05: qty 10

## 2015-09-05 MED ORDER — AMIODARONE HCL 400 MG PO TABS: 400.0000 mg | ORAL_TABLET | Freq: Two times a day (BID) | ORAL | Status: DC

## 2015-09-05 MED ORDER — DILTIAZEM HCL ER COATED BEADS 240 MG PO CP24
240.0000 mg | ORAL_CAPSULE | Freq: Every day | ORAL | Status: DC
  Administered 2015-09-05: 240 mg via ORAL
  Filled 2015-09-05: qty 1

## 2015-09-05 MED ORDER — AMIODARONE HCL 400 MG PO TABS: 400.0000 mg | ORAL_TABLET | Freq: Every day | ORAL | Status: DC

## 2015-09-05 MED ORDER — PERFLUTREN LIPID MICROSPHERE
1.0000 mL | Freq: Once | INTRAVENOUS | Status: AC
  Administered 2015-09-05: 4 mL via INTRAVENOUS

## 2015-09-05 NOTE — Progress Notes (Addendum)
Patient Name: [name] Date of Encounter: 09/05/2015  Active Problems:   Atrial fibrillation with rapid ventricular response [facility])   Primary Cardiologist: Dr. 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 She presented to the ED on 09/04/15 with palpitations and found to be in Afib with rate in 120-130's.   SUBJECTIVE: Feels well, denies palpitations. No chest pain or nausea.    OBJECTIVE Filed Vitals:   09/05/15 0413 09/05/15 0414 09/05/15 0500 09/05/15 0602  BP:  118/63 138/74 125/53  Pulse:  59 59 59  Temp: 98 F (36.7 C)     TempSrc: Oral     Resp:  _0 Height:      Weight: 235 lb 6.4 oz (106.777 kg)     SpO2:  96% 96% 97%    Intake/Output Summary (Last 24 hours) at 09/05/15 0739 Last data filed at 09/05/15 0021  Gross per 24 hour  Intake  74.91 ml  Output   1950 ml  Net -1875.09 ml   Filed Weights   09/04/15 0713 09/04/15 1438 09/05/15 0413  Weight: 232 lb (105.235 kg) 236 lb (107.049 kg) 235 lb 6.4 oz (106.777 kg)    PHYSICAL EXAM General: Well developed, well nourished, female in no acute distress. Head: Normocephalic, atraumatic.  Neck: Supple without bruits, no JVD. Lungs:  Resp regular and unlabored, CTA. Heart: RRR, S1, S2, no S3, S4, or murmur; no rub. Abdomen: Soft, non-tender, non-distended, BS + x 4.  Extremities: No clubbing, cyanosis, no edema.  Neuro: Alert and oriented X 3. Moves all extremities spontaneously. Psych: Normal affect.  LABS: CBC: Recent Labs  09/04/15 0729  WBC 10.2  HGB 12.7  HCT 39.3  MCV 91.4  PLT 306   INR: Recent Labs  09/04/15 0729  INR 8.75   Basic Metabolic Panel: Recent Labs  09/04/15 0729 09/05/15 0528  NA 135 136  K 3.9 3.7  CL 101 100*  CO2 25 27  GLUCOSE 249* 275*  BUN 14 16  CREATININE 0.85 0.90  CALCIUM 9.3 9.4  MG 1.4*  --    Liver  Function Tests: Recent Labs  09/05/15 0528  AST 33  ALT 24  ALKPHOS 47  BILITOT 0.5  PROT 6.8  ALBUMIN 3.7   Cardiac Enzymes: Recent Labs  09/04/15 0729  TROPONINI <0.03   Hemoglobin A1C: Recent Labs  09/04/15 1539  HGBA1C 8.9*   Fasting Lipid Panel: Recent Labs  09/05/15 0528  CHOL 150  HDL 33*  LDLCALC 70  TRIG 235*  CHOLHDL 4.5   Thyroid Function Tests: Recent Labs  09/05/15 0528  TSH 2.088     Current facility-administered medications:  .  acetaminophen (TYLENOL) tablet 650 mg, 650 mg, Oral, Q4H PRN, Arbutus Leas, NP .  amiodarone (PACERONE) tablet 400 mg, 400 mg, Oral, BID, Arbutus Leas, NP, 400 mg at 09/04/15 2118 .  apixaban (ELIQUIS) tablet 5 mg, 5 mg, Oral, BID, Arbutus Leas, NP, 5 mg at 09/04/15 2117 .  aspirin EC tablet 81 mg, 81 mg, Oral, Daily, Arbutus Leas, NP .  atorvastatin (LIPITOR) tablet 10 mg, 10 mg, Oral, Daily, Arbutus Leas, NP .  cholecalciferol (VITAMIN D) tablet 1,000 Units, 1,000 Units, Oral, Daily, Arbutus Leas, NP .  diltiazem (CARDIZEM) 100 mg in dextrose 5 %  100 mL (1 mg/mL) infusion, 5-15 mg/hr, Intravenous, Titrated, Arbutus Leas, NP, Last Rate: 5 mL/hr at 09/05/15 0259, 5 mg/hr at 09/05/15 0259 .  fenofibrate tablet 160 mg, 160 mg, Oral, Daily, Arbutus Leas, NP .  furosemide (LASIX) injection 40 mg, 40 mg, Intravenous, BID, Arbutus Leas, NP, 40 mg at 09/04/15 1730 .  gabapentin (NEURONTIN) capsule 300 mg, 300 mg, Oral, QHS, Arbutus Leas, NP, 300 mg at 09/04/15 2119 .  insulin aspart (novoLOG) injection 0-20 Units, 0-20 Units, Subcutaneous, TID WC, Christopher End, MD .  insulin aspart (novoLOG) injection 0-5 Units, 0-5 Units, Subcutaneous, QHS, Nelva Bush, MD, 5 Units at 09/04/15 2120 .  losartan (COZAAR) tablet 50 mg, 50 mg, Oral, Daily, Arbutus Leas, NP .  metFORMIN (GLUCOPHAGE) tablet 500 mg, 500 mg, Oral, BID WC, Arbutus Leas, NP, 500 mg at 09/04/15 1729 .  metoprolol succinate (TOPROL-XL) 24 hr tablet 100 mg, 100 mg,  Oral, Daily, Arbutus Leas, NP, 100 mg at 09/04/15 1729 .  omega-3 acid ethyl esters (LOVAZA) capsule 2 g, 2 g, Oral, BID, Arbutus Leas, NP, 2 g at 09/04/15 2118 .  ondansetron (ZOFRAN) injection 4 mg, 4 mg, Intravenous, Q6H PRN, Arbutus Leas, NP .  potassium chloride SA (K-DUR,KLOR-CON) CR tablet 20 mEq, 20 mEq, Oral, BID, Arbutus Leas, NP, 20 mEq at 09/04/15 2239 . diltiazem (CARDIZEM) infusion 5 mg/hr (09/05/15 0259)   TELE:  A paced   ECG: A paced.   Radiology/Studies: Dg Chest 2 View  09/04/2015  CLINICAL DATA:  Shortness of breath beginning last night. Atrial fibrillation. EXAM: CHEST  2 VIEW COMPARISON:  PA and lateral chest 06/07/2015 and 05/23/2010. CT chest 05/23/2010. FINDINGS: There is cardiomegaly and pulmonary edema. No pneumothorax or pleural effusion. The patient is status post CABG with a pacing device in place. IMPRESSION: Cardiomegaly and pulmonary edema. Electronically Signed   By: Inge Rise M.D.   On: 09/04/2015 07:51     Current Medications:  . amiodarone  400 mg Oral BID  . apixaban  5 mg Oral BID  . aspirin EC  81 mg Oral Daily  . atorvastatin  10 mg Oral Daily  . cholecalciferol  1,000 Units Oral Daily  . fenofibrate  160 mg Oral Daily  . furosemide  40 mg Intravenous BID  . gabapentin  300 mg Oral QHS  . insulin aspart  0-20 Units Subcutaneous TID WC  . insulin aspart  0-5 Units Subcutaneous QHS  . losartan  50 mg Oral Daily  . metFORMIN  500 mg Oral BID WC  . metoprolol succinate  100 mg Oral Daily  . omega-3 acid ethyl esters  2 g Oral BID  . potassium chloride  20 mEq Oral BID   . diltiazem (CARDIZEM) infusion 5 mg/hr (09/05/15 0259)    ASSESSMENT AND PLAN: Active Problems:   Atrial fibrillation with rapid ventricular response ([place])  1. Atrial fibrillation with RVR: Patient converted to atrial pacing last night at 23:30. She can be switched to po diltiazem today and gtt can be discontinued. Amiodarone 440m BID added yesterday, continue same  for today.   This patients CHA2DS2-VASc Score and unadjusted Ischemic Stroke Rate (% per year) is equal to 11.2 % stroke rate/year from a score of 7 Above score calculated as 1 point each if present [CHF, HTN, DM, Vascular=MI/PAD/Aortic Plaque, Age if 65-74, or Female], 2 points each if present [Age > 75, or Stroke/TIA/TE]  She is on Eliquis for anticoagulation.  2. Pulmonary edema: Breathing better this am after IV Lasix. Negative 1.8L. She looks more comfortable. MD to advise switching to po Lasix today. Echo pending.   3. History of CAD s/p CABG: Patient presents with chest pressure in the setting of atrial fibrillation with RVR. She denies having chest pain before this episode. She had a low risk nuclear study in July 2014.  6. DM: A1C is 8.9. Continue sliding scale.   7. HLD: LDL is 70, continue atorvastatin.  Signed, Arbutus Leas , NP 7:39 AM 09/05/2015 Pager [phone]  I have examined the patient and reviewed assessment and plan and discussed with patient.  Agree with above as stated.  Acute on chronic diastolic heart failure improved.  Afib resolved.  She feels better.  Change to oral lasix.  Stop diltiazem IV.  Restart oral.  COntinue amio.  Possible d/c later today if she feels well.  WOuld send home on Amio 400 mg daily.  This could be decreased in a month at a clinic visit.    Larae Grooms

## 2015-09-05 NOTE — Care Management Note (Addendum)
Case Management Note  Patient Details  Name: [name] MRN: [id number] Date of Birth: 1945-10-05  Subjective/Objective: Pt in for A Fib RVR- Pt is from home with husband. Plan will be to return home once stable.                    Action/Plan: CM did receive referral for CPAP- and not being able to afford. Pt had sleep study in 2014 @ [place]. Order was sent to [facility] for DME delivery- per pt she was not able to make payments at the time. Pt is now able to make payments. CM did call [facility] to see if information on file for CPAP and the process needed to get her a new one. CM will continue to monitor.    Expected Discharge Date:  09/05/15               Expected Discharge Plan:  Home/Self Care  In-House Referral:     Discharge planning Services  CM Consult  Post Acute Care Choice:  Durable Medical Equipment Choice offered to:  Patient  DME Arranged:  Continuous positive airway pressure (CPAP) DME Agency:     HH Arranged:  NA HH Agency:  NA  Status of Service:  Completed, signed off  If discussed at [place] of Stay Meetings, dates discussed:    Additional Comments:1654 09-05-15 Jacqlyn Krauss, RN, BSN [phone] CM did speak with Staff RN Janett Billow. Pt stated that she will set up Sleep Study outpatient. No further needs from CM at this time.    1521 09-05-15 Jacqlyn Krauss, RN,BSN [phone] Pt will need a new Sleep Study in order to get a new CPAP- Per Insurance.   Bethena Roys, RN 09/05/2015, 11:16 AM

## 2015-09-05 NOTE — Discharge Summary (Signed)
Discharge Summary    Patient ID: [name],  MRN: [id number], DOB/AGE: [birth year] 69 y.o.  Admit date: 09/04/2015 Discharge date: 09/05/2015  Primary Care Provider: [name] Primary Cardiologist: Dr. Claiborne Billings  Discharge Diagnoses    Active Problems:   Atrial fibrillation with rapid ventricular response ([place])   Allergies No Known Allergies  Diagnostic Studies/Procedures   _____________   History of Present Illness   Ms. [name] is a 70 year old female with a past medical history of HLD, TIA, PAF s/p MDT PPM on Eliquis, CAD s/p CABG in 2005 with maze procedure, and HTN. She presented to the ED on 09/04/15 with palpitations and found to be in Afib with rate in 120-130's.   Ms. [name] says at 9:30 pm on 09/03/15 she drank some very cold apple juice and shortly after she felt her heart start palpating. She felt like her heart was "going crazy" and developed some chest pressure as well. She took an extra 71m Metoprolol around 2 am. to see if that would help. She did not notice any improvement so she came to the ED.   EKG in ED showed Afib RVR with rate of 129. She was started on IV diltiazem and her rate initially improved with reduction of rate to the 90's, but it increased to the 120-130's shortly after.   She was hospitalized recently in March of this year with Afib RVR after missing several doses of her Cardizem. She converted to NSR on IV Cardizem and her home po Cardizem was increased to 2493mdaily. She states that since the increase of her Cardizem she has noticed increasing shortness of breath with activity. In the past 3 months she has not been able to lay flat for sleep, she uses 3 pillows for sleep. She does describe some PND. She also endorses increasing lower extremity edema.  Hospital Course  She was admitted for further observation. IV Diltiazem was continued. PO amiodarone was added for antiarrythmic control. Over night while in the hospital she converted to  atrial pacing. Her home dose Diltiazem was restarted. Her SOB improved with IV diuresis and as her rate improved. She will continue her home furosemide. TSH was within normal range.   She will continue 40089mo Amiodarone daily and can be reduced to the 200m8mily dosing in one month.   Echo was completed this admission. Results were pending at the time of discharge. She will need to follow up with Dr. KellClaiborne Billingshin 2 weeks.   She was seen today by Dr. VaraIrish Paul deemed suitable for discharge.  _____________  Discharge Vitals Blood pressure 144/76, pulse 70, temperature 98 F (36.7 C), temperature source Oral, resp. rate 18, height 5' 6" (1.676 m), weight 235 lb 6.4 oz (106.777 kg), SpO2 97 %.  Filed Weights   09/04/15 0713 09/04/15 1438 09/05/15 0413  Weight: 232 lb (105.235 kg) 236 lb (107.049 kg) 235 lb 6.4 oz (106.777 kg)    Labs & Radiologic Studies     CBC  Recent Labs  09/04/15 0729  WBC 10.2  HGB 12.7  HCT 39.3  MCV 91.4  PLT 306 391asic Metabolic Panel  Recent Labs  09/04/15 0729 09/05/15 0528  NA 135 136  K 3.9 3.7  CL 101 100*  CO2 25 27  GLUCOSE 249* 275*  BUN 14 16  CREATININE 0.85 0.90  CALCIUM 9.3 9.4  MG 1.4*  --    Liver Function Tests  Recent Labs  09/05/15  0528  AST 33  ALT 24  ALKPHOS 47  BILITOT 0.5  PROT 6.8  ALBUMIN 3.7   Cardiac Enzymes  Recent Labs  09/04/15 0729  TROPONINI <0.03   BNP Hemoglobin A1C  Recent Labs  09/04/15 1539  HGBA1C 8.9*   Fasting Lipid Panel  Recent Labs  09/05/15 0528  CHOL 150  HDL 33*  LDLCALC 70  TRIG 235*  CHOLHDL 4.5   Thyroid Function Tests  Recent Labs  09/05/15 0528  TSH 2.088    Dg Chest 2 View  09/04/2015  CLINICAL DATA:  Shortness of breath beginning last night. Atrial fibrillation. EXAM: CHEST  2 VIEW COMPARISON:  PA and lateral chest 06/07/2015 and 05/23/2010. CT chest 05/23/2010. FINDINGS: There is cardiomegaly and pulmonary edema. No pneumothorax or pleural  effusion. The patient is status post CABG with a pacing device in place. IMPRESSION: Cardiomegaly and pulmonary edema. Electronically Signed   By: Inge Rise M.D.   On: 09/04/2015 07:51    Disposition   Pt is being discharged home today in good condition.  Follow-up Plans & Appointments    Follow-up Information    Follow up with [name], PA-C On 09/19/2015.   Specialties:  Cardiology, Radiology   Why:  at 11:00am for follow up    Contact information:   [address] [place] [address] [phone]      Discharge Instructions    Diet - low sodium heart healthy    Complete by:  As directed      Increase activity slowly    Complete by:  As directed            Discharge Medications   Current Discharge Medication List    START taking these medications   Details  amiodarone (PACERONE) 400 MG tablet Take 1 tablet (400 mg total) by mouth daily. Qty: 30 tablet, Refills: 12      CONTINUE these medications which have NOT CHANGED   Details  acetaminophen (TYLENOL) 500 MG tablet Take 500-1,000 mg by mouth every 6 (six) hours as needed for moderate pain.    apixaban (ELIQUIS) 5 MG TABS tablet Take 1 tablet (5 mg total) by mouth 2 (two) times daily. Take 1st dose 06-07-14 Qty: 60 tablet, Refills: 1    aspirin 81 MG tablet Take 81 mg by mouth daily.     atorvastatin (LIPITOR) 10 MG tablet Take 1 tablet (10 mg total) by mouth daily. Qty: 30 tablet, Refills: 11    B Complex-Biotin-FA (B-COMPLEX PO) Take 1 capsule by mouth every morning.     BIOTIN PO Take 1 tablet by mouth daily.    Cholecalciferol (VITAMIN D3) 1000 UNITS CAPS Take 1,000 Units by mouth daily.    Coenzyme Q10 (CO Q-10) 200 MG CAPS Take 200 mg by mouth daily.    diltiazem (CARDIZEM CD) 240 MG 24 hr capsule Take 1 capsule (240 mg total) by mouth at bedtime. Qty: 30 capsule, Refills: 5    fenofibrate 160 MG tablet TAKE 1 TABLET DAILY Qty: 30 tablet, Refills: 5    furosemide (LASIX) 40  MG tablet Take 40 mg by mouth daily. Can take 1 extra tablet by mouth daily as needed for swelling/edema    gabapentin (NEURONTIN) 300 MG capsule Take 300 mg by mouth at bedtime.     insulin aspart protamine- aspart (NOVOLOG MIX 70/30) (70-30) 100 UNIT/ML injection Inject 35 Units into the skin 2 (two) times daily with a meal.     losartan (COZAAR)  50 MG tablet Take 1 tablet (50 mg total) by mouth daily. Qty: 90 tablet, Refills: 3    metFORMIN (GLUCOPHAGE) 500 MG tablet Take 500 mg by mouth 2 (two) times daily with a meal.     metoprolol succinate (TOPROL-XL) 100 MG 24 hr tablet TAKE 1 TABLET ONCE DAILY AFTER MEAL Qty: 30 tablet, Refills: 10    Omega-3 Fatty Acids (FISH OIL PO) Take 2 capsules by mouth 2 (two) times daily.    potassium chloride (K-DUR,KLOR-CON) 10 MEQ tablet Take 10 mEq by mouth every morning.     diphenhydramine-acetaminophen (TYLENOL PM) 25-500 MG TABS tablet Take 1 tablet by mouth at bedtime as needed (sleep).    NEOMYCIN-POLYMYXIN-HYDROCORTISONE (CORTISPORIN) 1 % SOLN otic solution Place 4 drops into both ears 2 (two) times daily as needed. For pain and itching      STOP taking these medications     omega-3 acid ethyl esters (LOVAZA) 1 g capsule             Outstanding Labs/Studies  Please review Echo results as they were still pending at the time of discharge.   Duration of Discharge Encounter   Greater than 30 minutes including physician time.  Signed, Arbutus Leas NP 09/05/2015, 3:59 PM   I have examined the patient and reviewed assessment and plan and discussed with patient. Agree with above as stated. Acute on chronic diastolic heart failure improved.  Afib resolved. She feels better. Change to oral lasix.  Restart oral diltiazem. COntinue amio. Possible d/c later today if she feels well. WOuld send home on Amio 400 mg daily. This could be decreased in a month at a clinic visit.   F/u with Dr. Claiborne Billings.  Larae Grooms

## 2015-09-05 NOTE — Progress Notes (Signed)
Inpatient Diabetes Program Recommendations  AACE/ADA: New Consensus Statement on Inpatient Glycemic Control (2015)  Target Ranges:  Prepandial:   less than 140 mg/dL      Peak postprandial:   less than 180 mg/dL (1-2 hours)      Critically ill patients:  140 - 180 mg/dL  Results for [name], [name] (MRN [id number]) as of 09/05/2015 13:14  Ref. Range 09/04/2015 17:20 09/04/2015 19:48 09/05/2015 07:47 09/05/2015 11:10  Glucose-Capillary Latest Ref Range: 65-99 mg/dL 237 (H) 369 (H) 296 (H) 262 (H)   Results for [name], [name] (MRN [id number]) as of 09/05/2015 13:14  Ref. Range 09/04/2015 15:39  Hemoglobin A1C Latest Ref Range: 4.8-5.6 % 8.9 (H)    Review of Glycemic Control  Diabetes history: DM 2 Outpatient Diabetes medications: 70/30 insulin 35 units bid ac meals + Metformin 500 mg bid Current orders for Inpatient glycemic control: Metformin 500 mg bid + Novolog correction 0-20 units tid + 0-5 units hs  Inpatient Diabetes Program Recommendations:  Noted elevated CBGs. Please consider approx. 50% of home insulin regimen 70/30 20 units bid ac meals.  Thank you, Nani Gasser. Marjan Rosman, RN, MSN, CDE Inpatient Glycemic Control Team Team Pager [phone] (8am-5pm) 09/05/2015 1:19 PM

## 2015-09-05 NOTE — Care Management Obs Status (Signed)
[facility] NOTIFICATION   Patient Details  Name: [name] MRN: [id number] Date of Birth: 1945/07/04   Medicare Observation Status Notification Given:  Yes    Bethena Roys, RN 09/05/2015, 10:27 AM

## 2015-09-05 NOTE — Discharge Instructions (Signed)

## 2015-09-05 NOTE — Research (Signed)
Apixaban Validation Study (ClinicalTrials.gov Identifier: [id number]) RESEARCH SUBJECT. Purpose: Obtain fresh samples that will be used to assess the performances of STA-Apixaban Calibrator and STA-Apixaban Control in combination with the STA-Liquid Anti-Xa to determine the quantity of apixaban in plasma samples by measurement of its direct anti-Xa activity. Apixaban Validation Study is sponsored by [employer].The fresh samples will collected by completing a one time blood draw on approved patients.   Inclusion Criteria: Must meet AT LEAST one of the following:  weight </= 60kg or >/= 75 years or Hct <39% for female or < 36% for female or renal impairment or co-medication with ASA/NSAIDs, or co-medication with anti-platelet agents.  Apixaban Validation Study Informed Consent   Subject Name: [name]  This patient, [name], has been consented to the above clinical trial according to FDA regulations, GCP guidelines and [employer]'s SOPs. The informed consent form and study design have been explained to this patient by this study coordinator at 8:35 AM on 09/05/2015. The patient demonstrated comprehension of this clinical trial and study requirements/expectations. No study procedures have been initiated before consenting of this patient. The patient was given sufficient time for reading the consent form. All risks, benefits and options have been thoroughly discussed and all questions were answered per the patient's satisfaction. This patient was not coerced in any way to participate in this clinical trial. This patient has voluntarily signed consent version 1.0 at 9:47 AM on 09/05/2015. A copy of the signed consent form was given to the patient and a copy was placed in the subject's medical record. Subject was thanked for their participation in research and contribution to science.  Dennison Mascot, RN Clinical Research Nurse  [place], [facility] Office: [phone]

## 2015-09-05 NOTE — Progress Notes (Signed)
  Echocardiogram 2D Echocardiogram has been performed.  [name] 09/05/2015, 3:36 PM

## 2015-09-05 NOTE — Progress Notes (Addendum)
The client started A pacing at a heart rate of 60. She is on a Cardizem gtt. Upon looking at the telemetry reading I can not visualize any P waves still. Getting an EKG for conformation of continued A fib. After notifying Cardioloy MD End on call he ordered the EKG and to decrease gtt to 69m/hr. Will await orders after EKG conformation.  Addendum:   EKG has been review by MD End. Ordered to continue Cardizem gtt at 565mhr and morning team will get the client changed back over to PO medication if they see fit at that time. I will continue to monitor the client closely.  TiSaddie BendersN

## 2015-09-19 ENCOUNTER — Ambulatory Visit (INDEPENDENT_AMBULATORY_CARE_PROVIDER_SITE_OTHER): Payer: Medicare Other | Admitting: Cardiology

## 2015-09-19 ENCOUNTER — Encounter: Payer: Self-pay | Admitting: Cardiology

## 2015-09-19 VITALS — BP 132/72 | HR 79 | Ht 66.0 in | Wt 241.2 lb

## 2015-09-19 DIAGNOSIS — Z950 Presence of cardiac pacemaker: Secondary | ICD-10-CM

## 2015-09-19 DIAGNOSIS — Z7901 Long term (current) use of anticoagulants: Secondary | ICD-10-CM

## 2015-09-19 DIAGNOSIS — Z951 Presence of aortocoronary bypass graft: Secondary | ICD-10-CM

## 2015-09-19 DIAGNOSIS — I480 Paroxysmal atrial fibrillation: Secondary | ICD-10-CM

## 2015-09-19 MED ORDER — DILTIAZEM HCL ER COATED BEADS 120 MG PO CP24: 120.0000 mg | ORAL_CAPSULE | Freq: Every day | ORAL | Status: DC

## 2015-09-19 MED ORDER — AMIODARONE HCL 200 MG PO TABS: 200.0000 mg | ORAL_TABLET | Freq: Every day | ORAL | Status: DC

## 2015-09-19 NOTE — Assessment & Plan Note (Addendum)
CHADs VASc= 8- on Eliquis

## 2015-09-19 NOTE — Progress Notes (Signed)
09/19/2015 [name]   [birth year]  [id number]  Primary Physician [name], DO Primary Cardiologist: Dr Kelly/ Dr [name]  HPI:  70 year old morbidly obese female with a past medical history of OSA- C-pap intol, HLD, TIA, PAF s/p MDT PPM on Eliquis, CAD s/p CABG in 2005 with maze procedure, and HTN. She was admitted in March 2017 with CHF and AF with RVR. Her Diltiazem was increased then. She then presented to the ED on 09/04/15 with palpitations and found to be in Afib with rate in 120-130's. Amiodarone was added. She is seen in the office for follow up. She is A paced. She denies any further tachycardia or dyspnea. She does complain about LE edema since her Diltiazem was increased.    Current Outpatient Prescriptions  Medication Sig Dispense Refill  . acetaminophen (TYLENOL) 500 MG tablet Take 500-1,000 mg by mouth every 6 (six) hours as needed for moderate pain.    [name] amiodarone (PACERONE) 200 MG tablet Take 1 tablet (200 mg total) by mouth daily. 30 tablet 9  . apixaban (ELIQUIS) 5 MG TABS tablet Take 1 tablet (5 mg total) by mouth 2 (two) times daily. Take 1st dose 06-07-14 60 tablet 1  . aspirin 81 MG tablet Take 81 mg by mouth daily.     [name] atorvastatin (LIPITOR) 10 MG tablet Take 1 tablet (10 mg total) by mouth daily. 30 tablet 11  . B Complex-Biotin-FA (B-COMPLEX PO) Take 1 capsule by mouth every morning.     [name] BIOTIN PO Take 1 tablet by mouth daily.    . Cholecalciferol (VITAMIN D3) 1000 UNITS CAPS Take 1,000 Units by mouth daily.    . Coenzyme Q10 (CO Q-10) 200 MG CAPS Take 200 mg by mouth daily.    [name] diltiazem (CARDIZEM CD) 120 MG 24 hr capsule Take 1 capsule (120 mg total) by mouth at bedtime. 30 capsule 9  . diphenhydramine-acetaminophen (TYLENOL PM) 25-500 MG TABS tablet Take 1 tablet by mouth at bedtime as needed (sleep).    . fenofibrate 160 MG tablet Take 160 mg by mouth daily.    . furosemide (LASIX) 40 MG tablet Take 40 mg by mouth daily. Can take 1 extra tablet  by mouth daily as needed for swelling/edema    . gabapentin (NEURONTIN) 300 MG capsule Take 300 mg by mouth at bedtime.     . insulin aspart protamine- aspart (NOVOLOG MIX 70/30) (70-30) 100 UNIT/ML injection Inject 35 Units into the skin 2 (two) times daily with a meal.     . losartan (COZAAR) 50 MG tablet Take 1 tablet (50 mg total) by mouth daily. 90 tablet 3  . metFORMIN (GLUCOPHAGE) 500 MG tablet Take 500 mg by mouth 2 (two) times daily with a meal.     . metoprolol succinate (TOPROL XL) 100 MG 24 hr tablet Take 100 mg by mouth daily. Take with or immediately following a meal.    . NEOMYCIN-POLYMYXIN-HYDROCORTISONE (CORTISPORIN) 1 % SOLN otic solution Place 4 drops into both ears 2 (two) times daily as needed. For pain and itching    . Omega-3 Fatty Acids (FISH OIL PO) Take 2 capsules by mouth 2 (two) times daily.    . potassium chloride (K-DUR,KLOR-CON) 10 MEQ tablet Take 10 mEq by mouth every morning.      No current facility-administered medications for this visit.    No Known Allergies  Social History   Social History  . Marital Status: Married    Spouse Name:  N/A  . Number of Children: N/A  . Years of Education: N/A   Occupational History  . Not on file.   Social History Main Topics  . Smoking status: Never Smoker   . Smokeless tobacco: Never Used  . Alcohol Use: No  . Drug Use: No  . Sexual Activity: Not on file   Other Topics Concern  . Not on file   Social History Narrative     Review of Systems: General: negative for chills, fever, night sweats or weight changes.  Cardiovascular: negative for chest pain, dyspnea on exertion, edema, orthopnea, palpitations, paroxysmal nocturnal dyspnea or shortness of breath Dermatological: negative for rash Respiratory: negative for cough or wheezing Urologic: negative for hematuria Abdominal: negative for nausea, vomiting, diarrhea, bright red blood per rectum, melena, or hematemesis Neurologic: negative for visual  changes, syncope, or dizziness All other systems reviewed and are otherwise negative except as noted above.    Blood pressure 132/72, pulse 79, height _0  (1.676 m), weight 241 lb 3.2 oz (109.408 kg).  General appearance: alert, cooperative, no distress and morbidly obese Lungs: few basilar crackles Heart: regular rate and rhythm and 2/6 systolic murmur AOV Extremities: 1+ edema Neurologic: Grossly normal  EKG A paced  ASSESSMENT AND PLAN:   Paroxysmal atrial fibrillation (HCC) Maze procedure done at surgery in 2005  Recurrence in March 2016 CHADS2VASC score  5  Recurrence 09/04/15- Amiodarone added   Pacemaker- March 2016  Due for pacer check.   Chronic anticoagulation CHADs VASc= 8- on Eliquis  Hx of CABG 2005 Coronary artery bypass grafting by Dr. Roxy Manns [place] [address], Maze procedure done at that time Low risk Myoview 2014   PLAN  I cut Ms Tarry's Diltiazem to 120 mg daily secondary to LE edema. She will decrease her Amiodarone to 200 mg daily in two weeks. She is due for a pacemaker check with Dr [name].   [name] PA-C 09/19/2015 11:31 AM

## 2015-09-19 NOTE — Assessment & Plan Note (Signed)
Due for pacer check.

## 2015-09-19 NOTE — Assessment & Plan Note (Signed)
Coronary artery bypass grafting by Dr. Roxy Manns in [address], Maze procedure done at that time Low risk Myoview 2014

## 2015-09-19 NOTE — Assessment & Plan Note (Signed)
Maze procedure done at surgery in 2005  Recurrence in March 2016 CHADS2VASC score  5  Recurrence 09/04/15- Amiodarone added

## 2015-09-19 NOTE — Patient Instructions (Addendum)
Medication Instructions:  DECREASE Diltiazem 120 mg daily and Amiodarone 200 mg daily  Labwork: NONE  Testing/Procedures: NONE  Follow-Up: Your physician recommends that you schedule a follow-up appointment in: with Dr [name] for Pacer check  Any Other Special Instructions Will Be Listed Below (If Applicable).   If you need a refill on your cardiac medications before your next appointment, please call your pharmacy.

## 2015-10-05 ENCOUNTER — Encounter: Payer: Medicare Other | Admitting: Internal Medicine

## 2015-10-08 ENCOUNTER — Encounter (HOSPITAL_COMMUNITY): Payer: Self-pay

## 2015-10-10 ENCOUNTER — Telehealth: Payer: Self-pay

## 2015-10-10 NOTE — Telephone Encounter (Signed)
Medication samples have been provided to the patient. Drug name: Eliquis 2.5 mg Qty: 28 tabs LOT: [id number] Exp.Date: 11/2016  Patient aware to take 2 tablets twice daily.  Chelley, CMA 12:57 PM 10/10/2015

## 2015-10-11 ENCOUNTER — Encounter: Payer: Medicare Other | Admitting: Internal Medicine

## 2015-12-04 ENCOUNTER — Ambulatory Visit (INDEPENDENT_AMBULATORY_CARE_PROVIDER_SITE_OTHER): Payer: Medicare Other | Admitting: Internal Medicine

## 2015-12-04 ENCOUNTER — Encounter: Payer: Self-pay | Admitting: Internal Medicine

## 2015-12-04 ENCOUNTER — Encounter (INDEPENDENT_AMBULATORY_CARE_PROVIDER_SITE_OTHER): Payer: Self-pay

## 2015-12-04 VITALS — BP 153/81 | HR 83 | Ht 64.5 in | Wt 250.8 lb

## 2015-12-04 DIAGNOSIS — I495 Sick sinus syndrome: Secondary | ICD-10-CM

## 2015-12-04 DIAGNOSIS — I480 Paroxysmal atrial fibrillation: Secondary | ICD-10-CM

## 2015-12-04 LAB — CUP PACEART INCLINIC DEVICE CHECK
Battery Remaining Longevity: 106 mo
Battery Voltage: 3.02 V
Brady Statistic AP VS Percent: 83.88 %
Brady Statistic AS VP Percent: 0.02 %
Brady Statistic AS VS Percent: 15.74 %
Implantable Lead Implant Date: 20160321
Implantable Lead Implant Date: 20160321
Implantable Lead Location: 753859
Implantable Lead Model: 5076
Lead Channel Impedance Value: 361 Ohm
Lead Channel Impedance Value: 475 Ohm
Lead Channel Impedance Value: 570 Ohm
Lead Channel Pacing Threshold Pulse Width: 0.4 ms
Lead Channel Pacing Threshold Pulse Width: 0.4 ms
Lead Channel Setting Pacing Amplitude: 2.5 V
MDC IDC LEAD LOCATION: 753860
MDC IDC MSMT LEADCHNL RA PACING THRESHOLD AMPLITUDE: 0.5 V
MDC IDC MSMT LEADCHNL RV IMPEDANCE VALUE: 513 Ohm
MDC IDC MSMT LEADCHNL RV PACING THRESHOLD AMPLITUDE: 0.5 V
MDC IDC MSMT LEADCHNL RV SENSING INTR AMPL: 19.75 mV
MDC IDC SESS DTM: 20170919110004
MDC IDC SET LEADCHNL RA PACING AMPLITUDE: 2 V
MDC IDC SET LEADCHNL RV PACING PULSEWIDTH: 0.4 ms
MDC IDC SET LEADCHNL RV SENSING SENSITIVITY: 0.9 mV
MDC IDC STAT BRADY AP VP PERCENT: 0.37 %
MDC IDC STAT BRADY RA PERCENT PACED: 84.24 %
MDC IDC STAT BRADY RV PERCENT PACED: 0.39 %

## 2015-12-04 NOTE — Progress Notes (Signed)
HPI Mrs. Lingenfelter returns today for followup. She is a pleasant 70 yo woman with a h/o PAF and long postermination pauses, s/p PPM insertion. Her atrial arrhythmias have been treated with amiodarone. She also had HTN. She also has kidney stones. She has had some bronchitis.    No Known Allergies   Current Outpatient Prescriptions  Medication Sig Dispense Refill  . acetaminophen (TYLENOL) 500 MG tablet Take 500-1,000 mg by mouth every 6 (six) hours as needed for moderate pain.    [name] amiodarone (PACERONE) 200 MG tablet Take 1 tablet (200 mg total) by mouth daily. 30 tablet 9  . apixaban (ELIQUIS) 5 MG TABS tablet Take 1 tablet (5 mg total) by mouth 2 (two) times daily. Take 1st dose 06-07-14 60 tablet 1  . aspirin 81 MG tablet Take 81 mg by mouth daily.     [name] atorvastatin (LIPITOR) 10 MG tablet Take 1 tablet (10 mg total) by mouth daily. 30 tablet 11  . B Complex-Biotin-FA (B-COMPLEX PO) Take 1 capsule by mouth every morning.     [name] BIOTIN PO Take 1 tablet by mouth daily.    . Cholecalciferol (VITAMIN D3) 1000 UNITS CAPS Take 1,000 Units by mouth daily.    . Coenzyme Q10 (CO Q-10) 200 MG CAPS Take 200 mg by mouth daily.    [name] diltiazem (CARDIZEM CD) 120 MG 24 hr capsule Take 1 capsule (120 mg total) by mouth at bedtime. 30 capsule 9  . diphenhydramine-acetaminophen (TYLENOL PM) 25-500 MG TABS tablet Take 1 tablet by mouth at bedtime as needed (sleep).    . fenofibrate 160 MG tablet Take 160 mg by mouth daily.    . furosemide (LASIX) 40 MG tablet Take 40 mg by mouth daily. Can take 1 extra tablet by mouth daily as needed for swelling/edema    . gabapentin (NEURONTIN) 300 MG capsule Take 300 mg by mouth at bedtime.     . GuaiFENesin (TUSSIN PO) Take by mouth 2 (two) times daily as needed (Cough/congestion). Pt unsure of strength    . insulin aspart protamine- aspart (NOVOLOG MIX 70/30) (70-30) 100 UNIT/ML injection Inject 35 Units into the skin 2 (two) times daily with a meal.     . losartan  (COZAAR) 50 MG tablet Take 1 tablet (50 mg total) by mouth daily. 90 tablet 3  . metFORMIN (GLUCOPHAGE) 500 MG tablet Take 500 mg by mouth 2 (two) times daily with a meal.     . metoprolol succinate (TOPROL XL) 100 MG 24 hr tablet Take 100 mg by mouth daily. Take with or immediately following a meal.    . NEOMYCIN-POLYMYXIN-HYDROCORTISONE (CORTISPORIN) 1 % SOLN otic solution Place 4 drops into both ears 2 (two) times daily as needed. For pain and itching    . Omega-3 Fatty Acids (FISH OIL PO) Take 2 capsules by mouth 2 (two) times daily.    . potassium chloride (K-DUR,KLOR-CON) 10 MEQ tablet Take 10 mEq by mouth every morning.      No current facility-administered medications for this visit.      Past Medical History:  Diagnosis Date  . Asthmatic bronchitis   . CHF (congestive heart failure) ([place]) dx'd 2016  . Coronary atherosclerosis of native coronary artery    a. s/p CABG 2005 Dr Roxy Manns    . Hyperlipidemia   . Hypertension   . Hypertensive heart disease   . Kidney stones   . Mixed hyperlipidemia   . Paroxysmal atrial fibrillation (HCC)  a. s/p Maze in 2005 b. recurrence in March 2016 with >8sec posttermination pauses s/p PPM implant    . Pneumonia 1950   "double"  . Presence of permanent cardiac pacemaker   . Sleep apnea    "tested; mask ordered; couldn't afford it; will get it now" (09/04/2015)  . TIA (transient ischemic attack)   . Type II diabetes mellitus ([place])   . Uterine cancer [facility])    age 24 with partial hysterectomy    ROS:   All systems reviewed and negative except as noted in the HPI.   Past Surgical History:  Procedure Laterality Date  . CARDIAC CATHETERIZATION  2005  . CORONARY ARTERY BYPASS GRAFT  2005   Dr Roxy Manns with MAZE  . CYSTOSCOPY WITH URETEROSCOPY, STONE BASKETRY AND STENT PLACEMENT Left 09/08/2014   Procedure: CYSTOSCOPY WITH URETEROSCOPY, STONE BASKETRY AND STENT PLACEMENT;  Surgeon: Cleon Gustin, MD;  Location: [facility];  Service: Urology;   Laterality: Left;  . CYSTOSCOPY/URETEROSCOPY/HOLMIUM LASER/STENT PLACEMENT Left 09/15/2014   Procedure: CYSTOSCOPY/RETROGRADE/URETEROSCOPY/HOLMIUM LASER/ STONE EXTRACTION /STENT EXCHANGE;  Surgeon: Cleon Gustin, MD;  Location: [facility];  Service: Urology;  Laterality: Left;  . HOLMIUM LASER APPLICATION Left 2/63/7858   Procedure: HOLMIUM LASER APPLICATION;  Surgeon: Cleon Gustin, MD;  Location: [facility];  Service: Urology;  Laterality: Left;  . HOLMIUM LASER APPLICATION Left 10/20/275   Procedure: HOLMIUM LASER APPLICATION;  Surgeon: Cleon Gustin, MD;  Location: [facility];  Service: Urology;  Laterality: Left;  . INSERT / REPLACE / REMOVE PACEMAKER    . LAPAROSCOPIC CHOLECYSTECTOMY    . 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 VAGINAL HYSTERECTOMY  1975     Family History  Problem Relation Age of Onset  . Hypertension Mother   . Hyperlipidemia Mother   . Heart disease Mother   . Diabetes Father   . Stroke Father   . Heart disease Father   . Hypertension Sister     3 sisters  . Arthritis Brother   . Heart attack Maternal Grandfather   . Stroke Paternal Grandfather   . Hypertension Daughter      Social History   Social History  . Marital status: Married    Spouse name: N/A  . Number of children: N/A  . Years of education: N/A   Occupational History  . Not on file.   Social History Main Topics  . Smoking status: Never Smoker  . Smokeless tobacco: Never Used  . Alcohol use No  . Drug use: No  . Sexual activity: Not on file   Other Topics Concern  . Not on file   Social History Narrative  . No narrative on file     BP (!) 153/81   Pulse 83   Ht 5' 4.5" (1.638 m)   Wt 250 lb 12.8 oz (113.8 kg)   BMI 42.38 kg/m   Physical Exam:  Well appearing NAD HEENT: Unremarkable Neck:  No JVD, no thyromegally Lymphatics:  No adenopathy Back:  No CVA tenderness Lungs:   Clear with no wheezes HEART:  Regular rate rhythm, no murmurs, no rubs, no clicks Abd:  soft, positive bowel sounds, no organomegally, no rebound, no guarding Ext:  2 plus pulses, no edema, no cyanosis, no clubbing Skin:  No rashes no nodules Neuro:  CN II through XII intact, motor grossly intact   DEVICE  Normal device function.  See PaceArt for details.  Assess/Plan: 1. PAF - she is maintaining NSR over 99% of the time. Will continue amiodarone. 2. PPM - her Medtronic DDD PM is working normally.  3. HTN - Her blood pressure is elevated but she thinks that it is better at home. 4. CAD - she has no anginal symptoms. She is s/p CABG.   Mikle Bosworth.D.

## 2015-12-04 NOTE — Patient Instructions (Signed)
Medication Instructions:  Your physician recommends that you continue on your current medications as directed. Please refer to the Current Medication list given to you today.   Labwork: None ordered   Testing/Procedures: None ordered   Follow-Up: Your physician wants you to follow-up in: 12 months with Dr Knox Saliva will receive a reminder letter in the mail two months in advance. If you don't receive a letter, please call our office to schedule the follow-up appointment.   Remote monitoring is used to monitor your Pacemaker  from home. This monitoring reduces the number of office visits required to check your device to one time per year. It allows [place] to keep an eye on the functioning of your device to ensure it is working properly. You are scheduled for a device check from home on 03/04/16. You may send your transmission at any time that day. If you have a wireless device, the transmission will be sent automatically. After your physician reviews your transmission, you will receive a postcard with your next transmission date.     Any Other Special Instructions Will Be Listed Below (If Applicable).     If you need a refill on your cardiac medications before your next appointment, please call your pharmacy.

## 2015-12-13 ENCOUNTER — Ambulatory Visit (INDEPENDENT_AMBULATORY_CARE_PROVIDER_SITE_OTHER): Payer: Medicare Other | Admitting: Cardiovascular Disease

## 2015-12-13 ENCOUNTER — Encounter: Payer: Self-pay | Admitting: Cardiovascular Disease

## 2015-12-13 VITALS — BP 140/68 | HR 70 | Ht 64.0 in | Wt 249.4 lb

## 2015-12-13 DIAGNOSIS — Z7901 Long term (current) use of anticoagulants: Secondary | ICD-10-CM

## 2015-12-13 DIAGNOSIS — Z951 Presence of aortocoronary bypass graft: Secondary | ICD-10-CM

## 2015-12-13 DIAGNOSIS — I495 Sick sinus syndrome: Secondary | ICD-10-CM

## 2015-12-13 DIAGNOSIS — I2510 Atherosclerotic heart disease of native coronary artery without angina pectoris: Secondary | ICD-10-CM | POA: Diagnosis not present

## 2015-12-13 DIAGNOSIS — G4730 Sleep apnea, unspecified: Secondary | ICD-10-CM

## 2015-12-13 DIAGNOSIS — I10 Essential (primary) hypertension: Secondary | ICD-10-CM

## 2015-12-13 DIAGNOSIS — I480 Paroxysmal atrial fibrillation: Secondary | ICD-10-CM | POA: Diagnosis not present

## 2015-12-13 DIAGNOSIS — E782 Mixed hyperlipidemia: Secondary | ICD-10-CM | POA: Diagnosis not present

## 2015-12-13 NOTE — Patient Instructions (Addendum)
Medication Instructions:  Continue current medication  Labwork: None Ordered  Testing/Procedures: Your physician has recommended that you have a sleep study. This test records several body functions during sleep, including: brain activity, eye movement, oxygen and carbon dioxide blood levels, heart rate and rhythm, breathing rate and rhythm, the flow of air through your mouth and nose, snoring, body muscle movements, and chest and belly movement.  Follow-Up: Your physician wants you to follow-up in: 6 Months. You will receive a reminder letter in the mail two months in advance. If you don't receive a letter, please call our office to schedule the follow-up appointment.   Any Other Special Instructions Will Be Listed Below (If Applicable).   If you need a refill on your cardiac medications before your next appointment, please call your pharmacy.

## 2015-12-15 NOTE — Progress Notes (Signed)
Patient ID: [name], female   DOB: 06-Jun-1945, 70 y.o.   MRN: [id number]   Primary M.D.: Dr. [name]  HPI: [name] is a 70 year-old female who presents for a 6 month follow-up cardiology evaluation.    [name] admits to a history of CAD, diabetes mellitus, peripheral neuropathy, and hypertension.   She underwent CABG revascularization surgery x2 with a maze procedure for atrial fibrillation at that time by Dr. Roxy Manns.  She experienced 2 episodes of palpitations where she felt her heart was temporarily out of rhythm. She denied associated chest tightness. She does note some shortness of breath. . A 2-D echo Doppler study on 10/05/2012 showed normal systolic function with an ejection fraction of 55-60% with mild tissue Doppler abnormality. Her aortic valve was trileaflet and sclerotic with very mild stenosis with a mean gradient of 10 and a peak gradient of 20 mm respectively. There is trivial AR. She also had posterior calcified mitral annulus with mild MR.  A nuclear perfusion study in 2014 revealed mild breast attenuation but otherwise normal perfusion.   She underwent permanent pacemaker insertion by Dr. Crissie Sickles.  She also has had issues with kidney stones and underwent 2 urological procedures in June and July 2016 on her left kidney by Dr. Nicolette Bang.  She denies recent chest pressure.  She had recent blood work drawn by Dr. [name].  These are noted below in the laboratory section.  She notes some mild shortness of breath and mild leg swelling.  She denies any awareness of palpitations.  Since I last saw her, she states that she had gone into atrial fibrillation in March and again in June which occurred at night after drinking cold juice.  She does snore.  She was hospitalized overnight last on June 20 she presented with AF with a rapid ventricular rate at 129.  She was started on IV diltiazem with ultimate improvement and eventually her oral dose of Cardizem  was increased.  Troponins were negative.  An echo during that hospitalization showed an EF of 60-65%.  There was normal wall motion.  There was mild aortic valve stenosis and she had a mean gradient of 10 and peak gradient of 20 with a valve area of 1.35.  She was seen in follow-up by [name] and was remaining stable.  She recently saw Dr. [name] on 12/04/2015 and had normal pacemaker function and was maintaining normal sinus rhythm over 99% of the time on her current dose of amiodarone 200 mg.  She presents for follow-up cardiology evaluation.  Past Medical History:  Diagnosis Date  . Asthmatic bronchitis   . CHF (congestive heart failure) ([place]) dx'd 2016  . Coronary atherosclerosis of native coronary artery    a. s/p CABG 2005 Dr Roxy Manns    . Hyperlipidemia   . Hypertension   . Hypertensive heart disease   . Kidney stones   . Mixed hyperlipidemia   . Paroxysmal atrial fibrillation ([place])    a. s/p Maze in 2005 b. recurrence in March 2016 with >8sec posttermination pauses s/p PPM implant    . Pneumonia 1950   "double"  . Presence of permanent cardiac pacemaker   . Sleep apnea    "tested; mask ordered; couldn't afford it; will get it now" (09/04/2015)  . TIA (transient ischemic attack)   . Type II diabetes mellitus ([place])   . Uterine cancer [facility])    age 65 with partial hysterectomy    Past Surgical History:  Procedure Laterality Date  . CARDIAC CATHETERIZATION  2005  . CORONARY ARTERY BYPASS GRAFT  2005   Dr Roxy Manns with MAZE  . CYSTOSCOPY WITH URETEROSCOPY, STONE BASKETRY AND STENT PLACEMENT Left 09/08/2014   Procedure: CYSTOSCOPY WITH URETEROSCOPY, STONE BASKETRY AND STENT PLACEMENT;  Surgeon: Cleon Gustin, MD;  Location: [facility];  Service: Urology;  Laterality: Left;  . CYSTOSCOPY/URETEROSCOPY/HOLMIUM LASER/STENT PLACEMENT Left 09/15/2014   Procedure: CYSTOSCOPY/RETROGRADE/URETEROSCOPY/HOLMIUM LASER/ STONE EXTRACTION /STENT EXCHANGE;  Surgeon: Cleon Gustin, MD;  Location: [facility];  Service: Urology;  Laterality: Left;  . HOLMIUM LASER APPLICATION Left 2/59/5638   Procedure: HOLMIUM LASER APPLICATION;  Surgeon: Cleon Gustin, MD;  Location: [facility];  Service: Urology;  Laterality: Left;  . HOLMIUM LASER APPLICATION Left 09/19/6431   Procedure: HOLMIUM LASER APPLICATION;  Surgeon: Cleon Gustin, MD;  Location: [facility];  Service: Urology;  Laterality: Left;  . INSERT / REPLACE / REMOVE PACEMAKER    . LAPAROSCOPIC CHOLECYSTECTOMY    . 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 VAGINAL HYSTERECTOMY  1975    No Known Allergies  Current Outpatient Prescriptions  Medication Sig Dispense Refill  . acetaminophen (TYLENOL) 500 MG tablet Take 500-1,000 mg by mouth every 6 (six) hours as needed for moderate pain.    [name] amiodarone (PACERONE) 200 MG tablet Take 1 tablet (200 mg total) by mouth daily. 30 tablet 9  . apixaban (ELIQUIS) 5 MG TABS tablet Take 1 tablet (5 mg total) by mouth 2 (two) times daily. Take 1st dose 06-07-14 60 tablet 1  . aspirin 81 MG tablet Take 81 mg by mouth daily.     [name] atorvastatin (LIPITOR) 10 MG tablet Take 1 tablet (10 mg total) by mouth daily. 30 tablet 11  . B Complex-Biotin-FA (B-COMPLEX PO) Take 1 capsule by mouth every morning.     [name] BIOTIN PO Take 1 tablet by mouth daily.    . Cholecalciferol (VITAMIN D3) 1000 UNITS CAPS Take 1,000 Units by mouth daily.    . Coenzyme Q10 (CO Q-10) 200 MG CAPS Take 200 mg by mouth daily.    [name] diltiazem (CARDIZEM CD) 120 MG 24 hr capsule Take 1 capsule (120 mg total) by mouth at bedtime. 30 capsule 9  . diphenhydramine-acetaminophen (TYLENOL PM) 25-500 MG TABS tablet Take 1 tablet by mouth at bedtime as needed (sleep).    . fenofibrate 160 MG tablet Take 160 mg by mouth daily.    . furosemide (LASIX) 40 MG tablet Take 40 mg by mouth daily. Can take 1 extra tablet by mouth daily as needed for swelling/edema    .  gabapentin (NEURONTIN) 300 MG capsule Take 300 mg by mouth at bedtime.     . GuaiFENesin (TUSSIN PO) Take by mouth 2 (two) times daily as needed (Cough/congestion). Pt unsure of strength    . insulin aspart protamine- aspart (NOVOLOG MIX 70/30) (70-30) 100 UNIT/ML injection Inject 35 Units into the skin 2 (two) times daily with a meal.     . losartan (COZAAR) 50 MG tablet Take 1 tablet (50 mg total) by mouth daily. 90 tablet 3  . metFORMIN (GLUCOPHAGE) 500 MG tablet Take 500 mg by mouth 2 (two) times daily with a meal.     . metoprolol succinate (TOPROL XL) 100 MG 24 hr tablet Take 100 mg by mouth daily. Take with or immediately following a meal.    . NEOMYCIN-POLYMYXIN-HYDROCORTISONE (CORTISPORIN) 1 %  SOLN otic solution Place 4 drops into both ears 2 (two) times daily as needed. For pain and itching    . Omega-3 Fatty Acids (FISH OIL PO) Take 2 capsules by mouth 2 (two) times daily.    . potassium chloride (K-DUR,KLOR-CON) 10 MEQ tablet Take 10 mEq by mouth every morning.      No current facility-administered medications for this visit.     Socially she is married to [employer]; she has 2 children one grandchild. She is retired. She completed ninth grade education. Has no history of tobacco use. She is not drink alcohol. She does walk approximately 2-3 times per week.  Family History  Problem Relation Age of Onset  . Hypertension Mother   . Hyperlipidemia Mother   . Heart disease Mother   . Diabetes Father   . Stroke Father   . Heart disease Father   . Hypertension Sister     3 sisters  . Arthritis Brother   . Heart attack Maternal Grandfather   . Stroke Paternal Grandfather   . Hypertension Daughter    ROS General: Negative; No fevers, chills, or night sweats;  HEENT: Negative; No changes in vision or hearing, sinus congestion, difficulty swallowing Pulmonary: Negative; No cough, wheezing, shortness of breath, hemoptysis Cardiovascular: Negative; No chest pain, presyncope,  syncope, palpitations GI: Negative; No nausea, vomiting, diarrhea, or abdominal pain GU: Positive for recent left uteroscopic stone manipulation with laser lithotripsy for left kidney stone Musculoskeletal: Negative; no myalgias, joint pain, or weakness Hematologic/Oncology: Negative; no easy bruising, bleeding Endocrine: Positive for diabetes mellitus Neuro: Negative; no changes in balance, headaches Skin: Negative; No rashes or skin lesions Psychiatric: Negative; No behavioral problems, depression Sleep: Positive for snoring, no daytime sleepiness, hypersomnolence, bruxism, restless legs, hypnogognic hallucinations, no cataplexy Other comprehensive 14 point system review is negative.   PE BP 140/68 (BP Location: Left Arm, Patient Position: Sitting, Cuff Size: Large)   Pulse 70   Ht _0  (1.626 m)   Wt 249 lb 6 oz (113.1 kg)   BMI 42.81 kg/m    Repeat blood pressure 138/70  Wt Readings from Last 3 Encounters:  12/13/15 249 lb 6 oz (113.1 kg)  12/04/15 250 lb 12.8 oz (113.8 kg)  09/19/15 241 lb 3.2 oz (109.4 kg)   General: Alert, oriented, no distress. Normocephalic atraumatic.  Skin: normal turgor, no rashes HEENT: Normocephalic, atraumatic. Pupils round and reactive; sclera anicteric; bilateral xanthelasmas. Extraocular muscles are intact. Fundi mild arteriolar narrowing without hemorrhages or exudates. Nose without nasal septal hypertrophy Mouth/Parynx benign; Mallinpatti scale 3. Neck: No JVD, no carotid bruits with normal carotid upstroke Lungs: clear to ausculatation and percussion; no wheezing or rales Heart: RRR, s1 s2 normal 1/6 systolic murmur aortic area.;  No diastolic murmur; no rubs thrills or heaves Abdomen: Moderate obesity;soft, nontender; no hepatosplenomehaly, BS+; abdominal aorta nontender and not dilated by palpation. Back: No CVA tenderness Pulses 2+; no bruits. Extremities: Trace pretibial edema bilaterally, no clubbing cyanosis, Homan's sign negative    Neurologic: grossly nonfocal Psychologic: Normal affect and mood.  ECG (independently read by me): Atrially paced rhythm with a PR interval at 248.  Complete right bundle branch block.  QTc interval 458 ms.  March 2017 ECG (independently read by me):  Atrially paced rhythm with prolonged AV conduction with PR interval 232 ms.  Nonspecific ST-T changes.  September 2016 ECG (independently read by me): A paced rhythm at 63 with prolonged AV conduction with a PR 214.  QTC normal at 446  ms.  July 2015 ECG (independently read by me): Sinus rhythm with short PR 88 ms.  Nonspecific T changes.  Incomplete right bundle branch block  10/2013 ECG: Sinus rhythm at 69 beats per minute. QTc interval 445 ms.  LABS: BMP Latest Ref Rng & Units 09/05/2015 09/04/2015 06/08/2015  Glucose 65 - 99 mg/dL 275(H) 249(H) 172(H)  BUN 6 - 20 mg/dL _0 Creatinine 0.44 - 1.00 mg/dL 0.90 0.85 0.78  Sodium 135 - 145 mmol/L 136 135 139  Potassium 3.5 - 5.1 mmol/L 3.7 3.9 3.6  Chloride 101 - 111 mmol/L 100(L) 101 106  CO2 22 - 32 mmol/L _1 Calcium 8.9 - 10.3 mg/dL 9.4 9.3 8.9   Hepatic Function Latest Ref Rng & Units 09/05/2015 06/08/2015 06/04/2014  Total Protein 6.5 - 8.1 g/dL 6.8 6.1(L) 6.9  Albumin 3.5 - 5.0 g/dL 3.7 3.2(L) 3.9  AST 15 - 41 U/L 33 34 36  ALT 14 - 54 U/L _2 Alk Phosphatase 38 - 126 U/L 47 41 65  Total Bilirubin 0.3 - 1.2 mg/dL 0.5 0.7 0.5  Bilirubin, Direct 0.1 - 0.5 mg/dL 0.2 - 0.2   CBC Latest Ref Rng & Units 09/04/2015 06/07/2015 09/08/2014  WBC 4.0 - 10.5 K/uL 10.2 8.4 9.9  Hemoglobin 12.0 - 15.0 g/dL 12.7 12.3 12.3  Hematocrit 36.0 - 46.0 % 39.3 37.2 37.5  Platelets 150 - 400 K/uL 306 283 313   Lab Results  Component Value Date   MCV 91.4 09/04/2015   MCV 90.1 06/07/2015   MCV 89.9 09/08/2014   Lab Results  Component Value Date   TSH 2.088 09/05/2015   Lab Results  Component Value Date   HGBA1C 8.9 (H) 09/04/2015   Lipid Panel     Component Value Date/Time    CHOL 150 09/05/2015 0528   TRIG 235 (H) 09/05/2015 0528   HDL 33 (L) 09/05/2015 0528   CHOLHDL 4.5 09/05/2015 0528   VLDL 47 (H) 09/05/2015 0528   LDLCALC 70 09/05/2015 0528   I personally reviewed.  Laboratory from Dr. Hughie Closs office from   February 22,017. Lipid studies revealed a total cholesterol 186, LDL cholesterol 80, triglycerides 333 and VLDL cholesterol 67. Her glucose was 283.   I personally reviewed.  Laboratory from Dr. Hughie Closs office from 11/27/2014: Fasting glucose 220, BUN 16, creatinine 0.66.  CO2 30.5.  Hemoglobin 12.1, hematocrit 37.3.  Hemoglobin A1c 9.4.  Total cholesterol 204, triglycerides 261, HDL 44, LDL 108, VLDL 52.   RADIOLOGY: No results found.   ASSESSMENT AND PLAN:    [name] is a 70 year old female who has a history of type 2 diabetes mellitus and is status post CABG surgery x2 by Dr. Roxy Manns in 2005. She has  a history of atrial fibrillation and underwent a Maze procedure at the time of her surgery.  She required permanent pacemaker insertion which was done by Dr. [name].  She continues to beon eliquis for anticoagulation.  Her ECG today shows an atrially paced rhythm without recurrent AF.  Her cha2ds2Vasc score of  at least 5 by virtue of her female sex, age, CAD, diabetes mellitus and hypertensive history. Her diabetes is not well controlled, as manifested by her hemoglobin A1c.  She has an atherogenic dyslipidemic lipid panel and is on fenofibrate  160 mg daily and has xanthelasmas on physical examination.  When I previouslysaw her, I recommended the addition of fish oil and recommended a retrial of statin therapy, which  reportedly she had not tolerated well in the past.  I reviewed her recent ulceration.  Presently, she is maintaining sinus rhythm on amiodarone 200 mg daily, diltiazem CD 120 mg and Toprol-XL 100 mg daily.  She continues to be on valsartan 15 oh grams daily for additional blood pressure control and has been taking furosemide  40 mg twice a day chest controlled edema.  Her recent episodes occurred at night.  Comorbidities, and recurrent AF.  I am recommending that she undergo a sleep study to evaluate for sleep apnea.  She tells me that labs are to be done by her primary physician and I will try to obtain these.  I will see her in 6 months for cardiology reevaluation or see sooner in a sleep clinic if CPAP therapy is instituted.  Time spent: 25 minutes   Troy Sine, MD, [facility] 12/15/2015 1:55 PM

## 2015-12-17 ENCOUNTER — Other Ambulatory Visit: Payer: Self-pay | Admitting: Cardiology

## 2015-12-17 MED ORDER — DILTIAZEM HCL ER COATED BEADS 120 MG PO CP24: 120.0000 mg | ORAL_CAPSULE | Freq: Every day | ORAL | 3 refills | Status: DC

## 2015-12-17 NOTE — Telephone Encounter (Signed)
error

## 2015-12-18 ENCOUNTER — Other Ambulatory Visit: Payer: Self-pay

## 2015-12-18 MED ORDER — FENOFIBRATE 160 MG PO TABS: 160.0000 mg | ORAL_TABLET | Freq: Every day | ORAL | 3 refills | Status: DC

## 2015-12-18 MED ORDER — METOPROLOL SUCCINATE ER 100 MG PO TB24: 100.0000 mg | ORAL_TABLET | Freq: Every day | ORAL | 3 refills | Status: DC

## 2016-01-22 ENCOUNTER — Ambulatory Visit (HOSPITAL_BASED_OUTPATIENT_CLINIC_OR_DEPARTMENT_OTHER): Payer: Medicare Other | Attending: Cardiovascular Disease | Admitting: Cardiovascular Disease

## 2016-01-22 VITALS — Ht 64.0 in | Wt 250.0 lb

## 2016-01-22 DIAGNOSIS — Z7901 Long term (current) use of anticoagulants: Secondary | ICD-10-CM | POA: Diagnosis not present

## 2016-01-22 DIAGNOSIS — G4730 Sleep apnea, unspecified: Secondary | ICD-10-CM | POA: Diagnosis present

## 2016-01-22 DIAGNOSIS — Z7984 Long term (current) use of oral hypoglycemic drugs: Secondary | ICD-10-CM | POA: Insufficient documentation

## 2016-01-22 DIAGNOSIS — I493 Ventricular premature depolarization: Secondary | ICD-10-CM | POA: Diagnosis not present

## 2016-01-22 DIAGNOSIS — Z79899 Other long term (current) drug therapy: Secondary | ICD-10-CM | POA: Diagnosis not present

## 2016-01-22 DIAGNOSIS — Z7982 Long term (current) use of aspirin: Secondary | ICD-10-CM | POA: Insufficient documentation

## 2016-01-22 DIAGNOSIS — R0902 Hypoxemia: Secondary | ICD-10-CM | POA: Insufficient documentation

## 2016-01-22 DIAGNOSIS — G4733 Obstructive sleep apnea (adult) (pediatric): Secondary | ICD-10-CM | POA: Diagnosis not present

## 2016-01-27 NOTE — Procedures (Signed)
Patient Name: [name], [name] Date: 01/22/2016 Gender: Female D.O.B: [birth year] Age (years): 54 Referring Provider: Shelva Majestic MD, ABSM Height (inches): 69 Interpreting Physician: Shelva Majestic MD, ABSM Weight (lbs): 250 RPSGT: Laren Everts BMI: 77 MRN: [id number] Neck Size: 15.00  CLINICAL INFORMATION Sleep Study Type: NPSG Indication for sleep study: Diabetes, Fatigue, Hypertension, Obesity, OSA, Re-Evaluation, Snoring, Witnessed Apneas Epworth Sleepiness Score: 4  Most recent polysomnogram dated 10/08/2012 revealed an AHI of 29.5/h and RDI of 56.0/h. Most recent titration study dated 11/11/2012 was optimal at 9cm H2O with an AHI of 1.6/h.  SLEEP STUDY TECHNIQUE As per the AASM Manual for the Scoring of Sleep and Associated Events v2.3 (April 2016) with a hypopnea requiring 4% desaturations. The channels recorded and monitored were frontal, central and occipital EEG, electrooculogram (EOG), submentalis EMG (chin), nasal and oral airflow, thoracic and abdominal wall motion, anterior tibialis EMG, snore microphone, electrocardiogram, and pulse oximetry.  MEDICATIONS acetaminophen (TYLENOL) 500 MG tablet amiodarone (PACERONE) 200 MG tablet apixaban (ELIQUIS) 5 MG TABS tablet aspirin 81 MG tablet atorvastatin (LIPITOR) 10 MG tablet B Complex-Biotin-FA (B-COMPLEX PO) BIOTIN PO Cholecalciferol (VITAMIN D3) 1000 UNITS CAPS Coenzyme Q10 (CO Q-10) 200 MG CAPS diltiazem (CARDIZEM CD) 120 MG 24 hr capsule diphenhydramine-acetaminophen (TYLENOL PM) 25-500 MG TABS tablet fenofibrate 160 MG tablet furosemide (LASIX) 40 MG tablet gabapentin (NEURONTIN) 300 MG capsule GuaiFENesin (TUSSIN PO) insulin aspart protamine- aspart (NOVOLOG MIX 70/30) (70-30) 100 UNIT/ML injection losartan (COZAAR) 50 MG tablet metFORMIN (GLUCOPHAGE) 500 MG tablet metoprolol succinate (TOPROL XL) 100 MG 24 hr tablet NEOMYCIN-POLYMYXIN-HYDROCORTISONE (CORTISPORIN) 1 % SOLN otic  solution Omega-3 Fatty Acids (FISH OIL PO) potassium chloride (K-DUR,KLOR-CON) 10 MEQ tablet  Medications self-administered by patient taken the night of the study : TYLENOL PM  SLEEP ARCHITECTURE The study was initiated at 10:30:44 PM and ended at 5:37:29 AM. Sleep onset time was 29.7 minutes and the sleep efficiency was 55.7%. The total sleep time was 237.5 minutes. Stage REM latency was 249.0 minutes. The patient spent 21.68% of the night in stage N1 sleep, 65.68% in stage N2 sleep, 0.00% in stage N3 and 12.63% in REM. Alpha intrusion was absent. Supine sleep was 0.00%.  RESPIRATORY PARAMETERS The overall apnea/hypopnea index (AHI) was 15.4 per hour. There were 1 total apneas, including 1 obstructive, 0 central and 0 mixed apneas. There were 60 hypopneas and 17 RERAs. The AHI during Stage REM sleep was 36.0 per hour. AHI while supine was N/A per hour. The mean oxygen saturation was 92.66%. The minimum SpO2 during sleep was 83.00%. Soft snoring was noted during this study.  CARDIAC DATA The 2 lead EKG demonstrated sinus rhythm. The mean heart rate was 60.20 beats per minute. Other EKG findings include: PVCs.  LEG MOVEMENT DATA The total PLMS were 23 with a resulting PLMS index of 5.81. Associated arousal with leg movement index was 1.5 .  IMPRESSIONS - Moderate obstructive sleep apnea (AHI = 15.4/h) overall; however, severe sleep apnea during REM sleep ( AHI 36/h). - No significant central sleep apnea occurred during this study (CAI = 0.0/h). - Moderate oxygen desaturation to a nadir of 83.00%. - Reduced sleep efficiency. - Abnormal sleep architecture with absence of slow wave sleep and prolonged latency to REM sleep.  - The patient snored with Soft snoring volume. - EKG findings include PVCs. - Mild periodic limb movements of sleep occurred during the study. No significant associated arousals.  DIAGNOSIS - Obstructive Sleep Apnea (327.23 [G47.33 ICD-10]) - Nocturnal  Hypoxemia (327.26 [G47.36  ICD-10])  RECOMMENDATIONS - Therapeutic CPAP titration to determine optimal pressure required to alleviate sleep disordered breathing. - Efforts should be made to optimize nasal and oropharyngeal patency. - Avoid alcohol, sedatives and other CNS depressants that may worsen sleep apnea and disrupt normal sleep architecture. - Sleep hygiene should be reviewed to assess factors that may improve sleep quality. - Weight management and regular exercise should be initiated or continued if appropriate.  [Electronically signed] 01/27/2016 04:18 PM  Shelva Majestic MD, [facility], ABSM Diplomate, American Board of Sleep Medicine   NPI: [id number]  [place] PH: [phone]   FX: [phone] [place]

## 2016-01-28 ENCOUNTER — Telehealth: Payer: Self-pay | Admitting: *Deleted

## 2016-01-28 NOTE — Telephone Encounter (Signed)
Left message to return a call to discuss sleep study results and recommendations.

## 2016-01-29 ENCOUNTER — Telehealth: Payer: Self-pay | Admitting: Cardiovascular Disease

## 2016-01-29 NOTE — Telephone Encounter (Signed)
Returned call to patient.Stated she had a sleep study done 01/22/16, she was going to get results.Advised results not available.I will send message to [facility] and Mariann Laster.

## 2016-01-29 NOTE — Telephone Encounter (Signed)
New message      Returning a call to the nurse to get sleep study results

## 2016-01-31 ENCOUNTER — Other Ambulatory Visit: Payer: Self-pay | Admitting: *Deleted

## 2016-01-31 DIAGNOSIS — G4733 Obstructive sleep apnea (adult) (pediatric): Secondary | ICD-10-CM

## 2016-01-31 DIAGNOSIS — G4761 Periodic limb movement disorder: Secondary | ICD-10-CM

## 2016-01-31 NOTE — Telephone Encounter (Signed)
Patient came into office with her daughter. Sleep study results and recommendations were discussed at that time. CPAP titration study ordered.

## 2016-02-01 ENCOUNTER — Telehealth: Payer: Self-pay | Admitting: *Deleted

## 2016-02-01 NOTE — Telephone Encounter (Signed)
Left message regarding appointment for CPAP titration study--Monday 03/24/16 at 8:00 pm at [facility].  Also left call back number for Sleep Center if appointment date not convenient.

## 2016-02-20 IMAGING — CR DG CHEST 1V PORT
1 series · 1 of 1 positions shown · non-contrast
Comparison: May 23, 2010 chest radiograph and May 23, 2010 chest
CT

CLINICAL DATA: Shortness of breath and atrial fibrillation

EXAM:
PORTABLE CHEST - 1 VIEW

[AP]
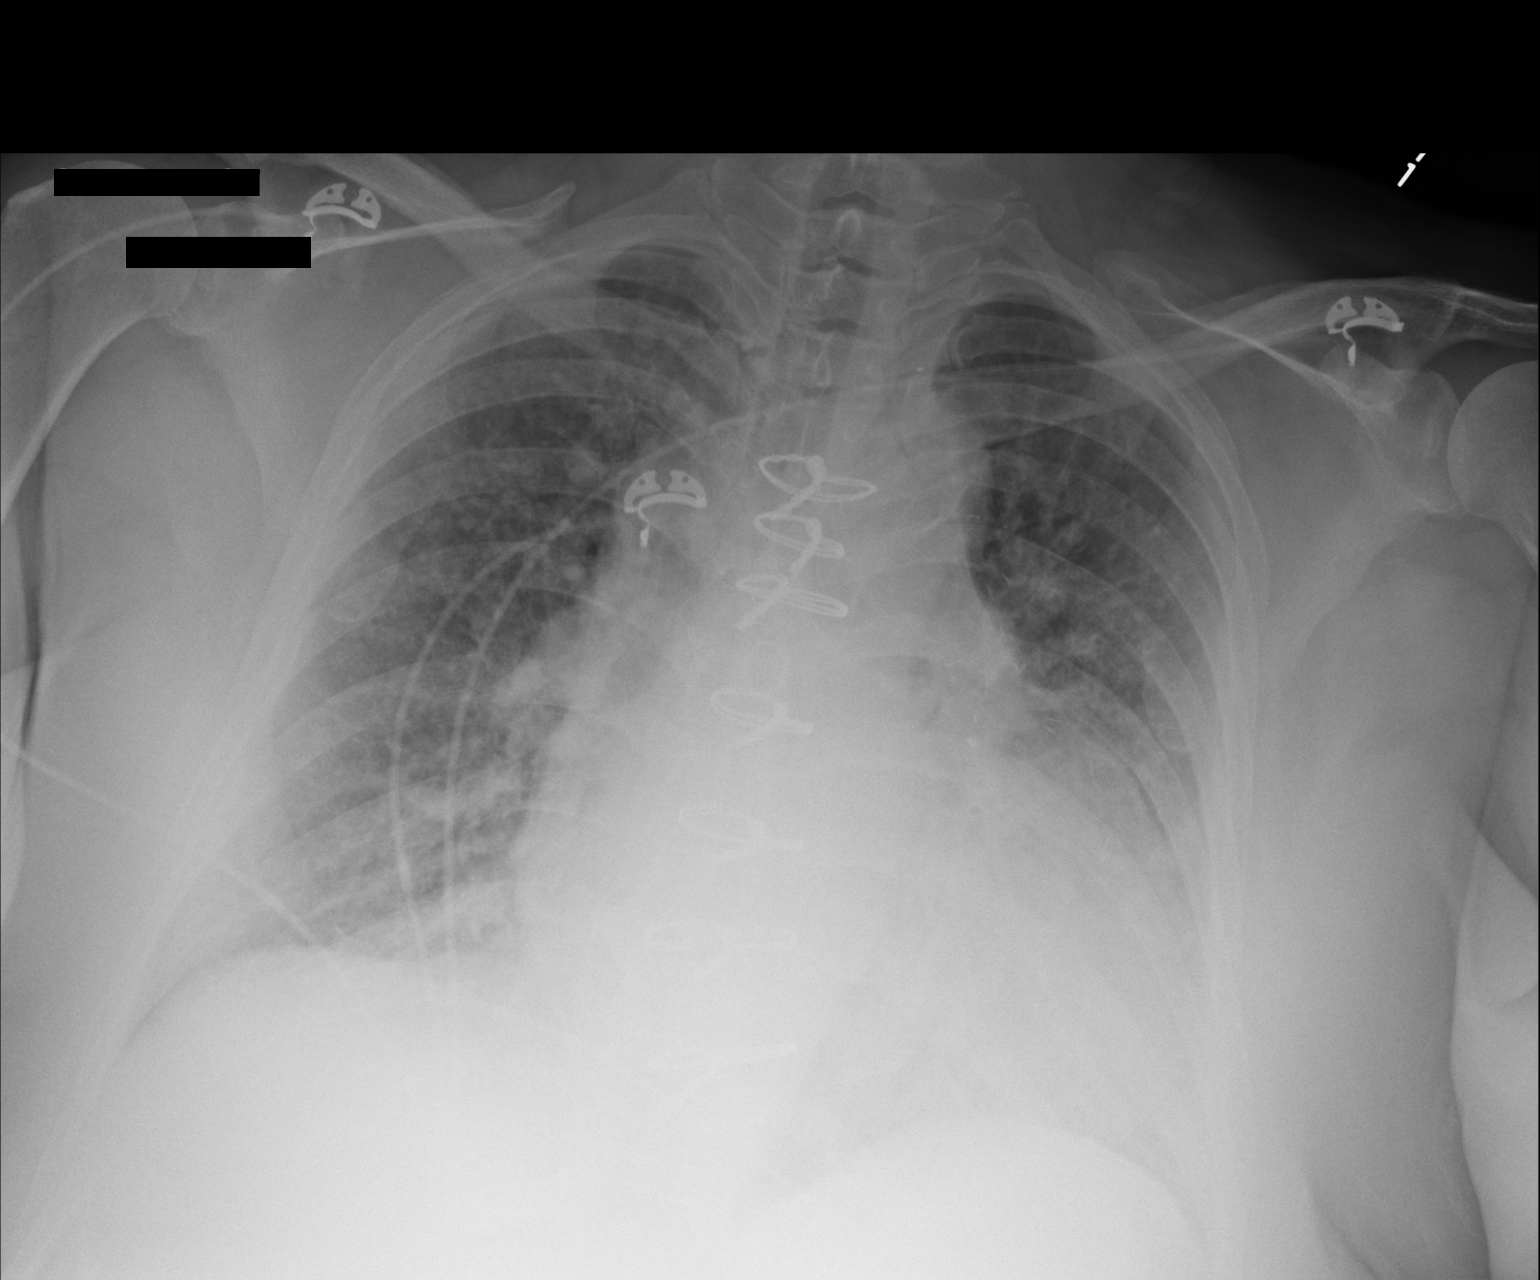

[1 of 1 positions shown; findings below may reference images not displayed]

FINDINGS: There is cardiomegaly with mild pulmonary venous hypertension. There
is mild generalized interstitial edema. There is no airspace
consolidation. No adenopathy. Patient is status post coronary artery
bypass grafting.
IMPRESSION: Findings consistent with a degree of congestive heart failure. No
airspace consolidation.

## 2016-02-21 IMAGING — DX DG CHEST 2V
2 series · 2 of 2 positions shown · non-contrast
Comparison: 06/05/1958

CLINICAL DATA: Status post pacemaker placement with left arm
soreness

EXAM:
CHEST  2 VIEW

[chest pa]
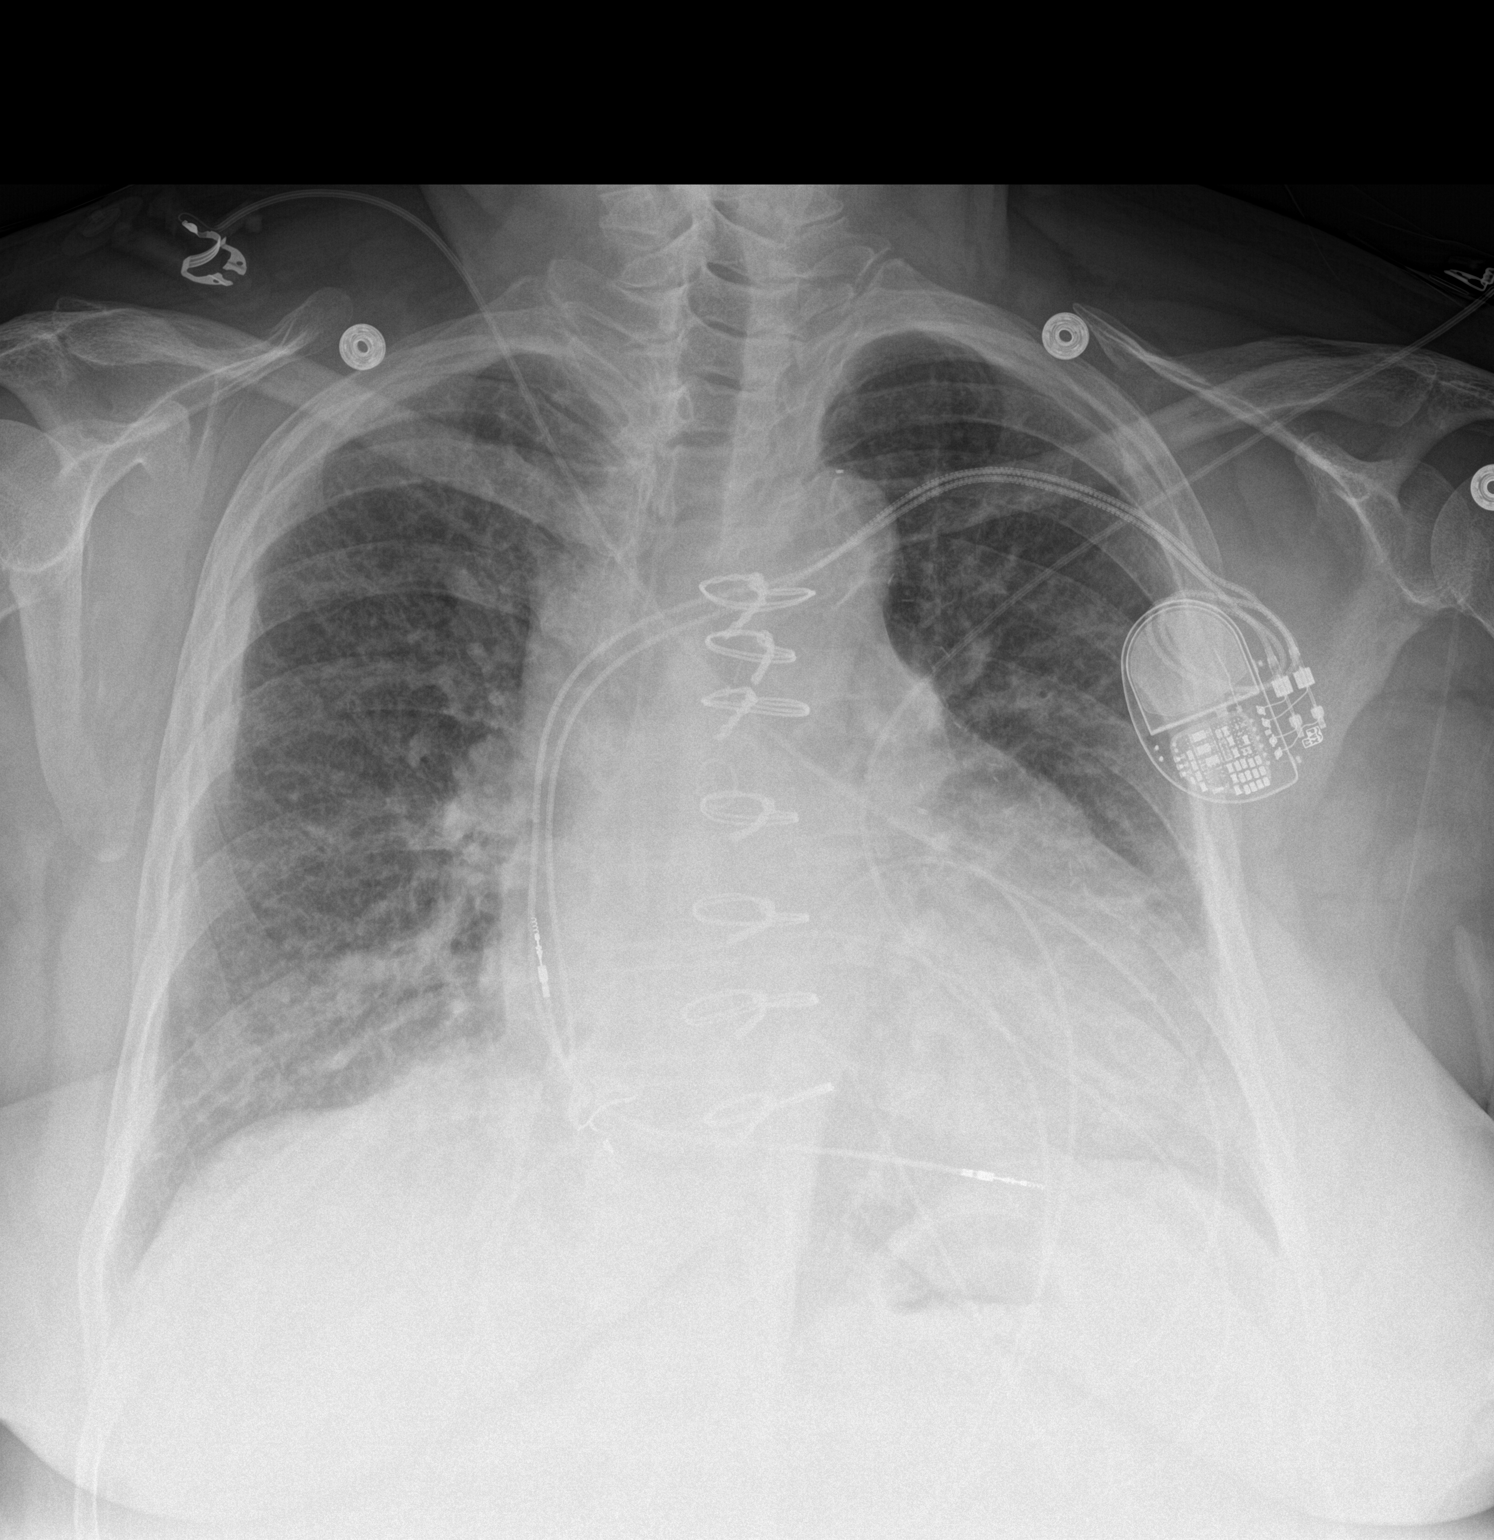

[chest lat]
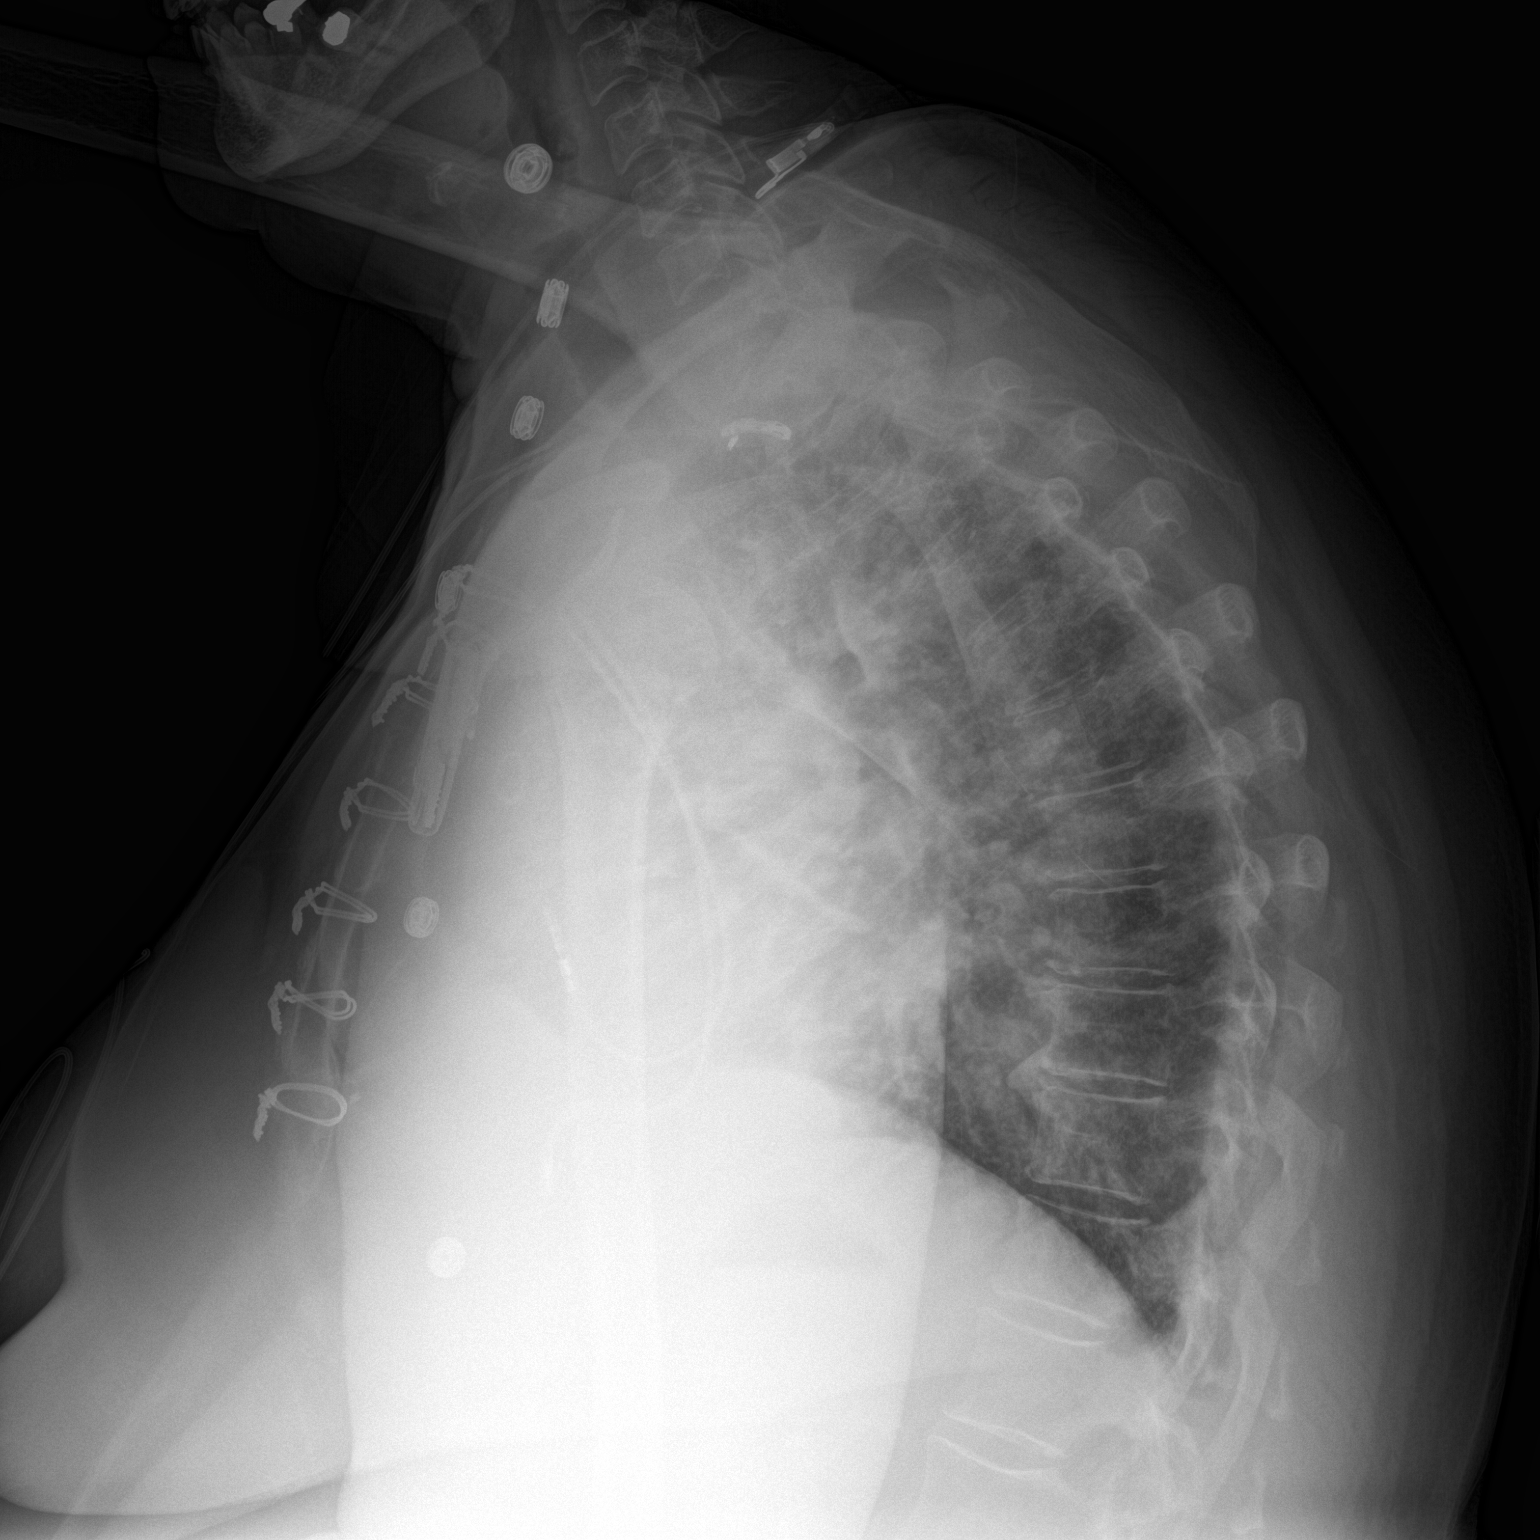

[2 of 2 positions shown; findings below may reference images not displayed]

FINDINGS: A pacing device is now seen. The cardiac shadow remains enlarged.
Postsurgical changes are noted. No pneumothorax is seen. Mild
vascular congestion remains. No acute abnormality is noted.
IMPRESSION: New pacing device.

Stable vascular congestion.

No acute abnormality is noted.

## 2016-03-04 ENCOUNTER — Encounter: Payer: Medicare Other | Admitting: *Deleted

## 2016-03-04 ENCOUNTER — Telehealth: Payer: Self-pay | Admitting: Cardiology

## 2016-03-04 NOTE — Telephone Encounter (Signed)
Confirmed remote transmission w/ pt husband.

## 2016-03-07 ENCOUNTER — Encounter: Payer: Self-pay | Admitting: Cardiology

## 2016-03-13 ENCOUNTER — Ambulatory Visit (INDEPENDENT_AMBULATORY_CARE_PROVIDER_SITE_OTHER): Payer: Medicare Other | Admitting: *Deleted

## 2016-03-13 DIAGNOSIS — I495 Sick sinus syndrome: Secondary | ICD-10-CM | POA: Diagnosis not present

## 2016-03-20 NOTE — Progress Notes (Signed)
Remote pacemaker transmission.

## 2016-03-21 ENCOUNTER — Encounter: Payer: Self-pay | Admitting: Cardiology

## 2016-03-21 LAB — CUP PACEART REMOTE DEVICE CHECK
Battery Remaining Longevity: 95 mo
Battery Voltage: 3.01 V
Brady Statistic AS VP Percent: 0.01 %
Brady Statistic RA Percent Paced: 94.96 %
Date Time Interrogation Session: 20171229002253
Implantable Lead Implant Date: 20160321
Implantable Lead Location: 753860
Implantable Lead Model: 5076
Implantable Pulse Generator Implant Date: 20160321
Lead Channel Impedance Value: 361 Ohm
Lead Channel Pacing Threshold Amplitude: 0.625 V
Lead Channel Pacing Threshold Pulse Width: 0.4 ms
Lead Channel Pacing Threshold Pulse Width: 0.4 ms
Lead Channel Sensing Intrinsic Amplitude: 1.625 mV
Lead Channel Sensing Intrinsic Amplitude: 1.625 mV
MDC IDC LEAD IMPLANT DT: 20160321
MDC IDC LEAD LOCATION: 753859
MDC IDC MSMT LEADCHNL RA IMPEDANCE VALUE: 475 Ohm
MDC IDC MSMT LEADCHNL RV IMPEDANCE VALUE: 532 Ohm
MDC IDC MSMT LEADCHNL RV IMPEDANCE VALUE: 570 Ohm
MDC IDC MSMT LEADCHNL RV PACING THRESHOLD AMPLITUDE: 0.625 V
MDC IDC MSMT LEADCHNL RV SENSING INTR AMPL: 19.5 mV
MDC IDC MSMT LEADCHNL RV SENSING INTR AMPL: 19.5 mV
MDC IDC SET LEADCHNL RA PACING AMPLITUDE: 2 V
MDC IDC SET LEADCHNL RV PACING AMPLITUDE: 2.5 V
MDC IDC SET LEADCHNL RV PACING PULSEWIDTH: 0.4 ms
MDC IDC SET LEADCHNL RV SENSING SENSITIVITY: 0.9 mV
MDC IDC STAT BRADY AP VP PERCENT: 0.17 %
MDC IDC STAT BRADY AP VS PERCENT: 94.92 %
MDC IDC STAT BRADY AS VS PERCENT: 4.91 %
MDC IDC STAT BRADY RV PERCENT PACED: 0.17 %

## 2016-03-24 ENCOUNTER — Encounter (HOSPITAL_BASED_OUTPATIENT_CLINIC_OR_DEPARTMENT_OTHER): Payer: Medicare Other

## 2016-06-03 ENCOUNTER — Other Ambulatory Visit: Payer: Self-pay | Admitting: *Deleted

## 2016-06-03 MED ORDER — ATORVASTATIN CALCIUM 10 MG PO TABS: 10.0000 mg | ORAL_TABLET | Freq: Every day | ORAL | 3 refills | Status: DC

## 2016-06-11 ENCOUNTER — Ambulatory Visit (INDEPENDENT_AMBULATORY_CARE_PROVIDER_SITE_OTHER): Payer: Medicare Other | Admitting: Cardiovascular Disease

## 2016-06-11 ENCOUNTER — Encounter: Payer: Self-pay | Admitting: Cardiovascular Disease

## 2016-06-11 VITALS — BP 132/70 | HR 81 | Ht 64.0 in | Wt 256.8 lb

## 2016-06-11 DIAGNOSIS — Z794 Long term (current) use of insulin: Secondary | ICD-10-CM

## 2016-06-11 DIAGNOSIS — G4733 Obstructive sleep apnea (adult) (pediatric): Secondary | ICD-10-CM

## 2016-06-11 DIAGNOSIS — Z7901 Long term (current) use of anticoagulants: Secondary | ICD-10-CM | POA: Diagnosis not present

## 2016-06-11 DIAGNOSIS — I110 Hypertensive heart disease with heart failure: Secondary | ICD-10-CM

## 2016-06-11 DIAGNOSIS — I350 Nonrheumatic aortic (valve) stenosis: Secondary | ICD-10-CM

## 2016-06-11 DIAGNOSIS — E782 Mixed hyperlipidemia: Secondary | ICD-10-CM | POA: Diagnosis not present

## 2016-06-11 DIAGNOSIS — I480 Paroxysmal atrial fibrillation: Secondary | ICD-10-CM | POA: Diagnosis not present

## 2016-06-11 DIAGNOSIS — E119 Type 2 diabetes mellitus without complications: Secondary | ICD-10-CM

## 2016-06-11 MED ORDER — LOSARTAN POTASSIUM 50 MG PO TABS: ORAL_TABLET | ORAL | 3 refills | Status: DC

## 2016-06-11 NOTE — Patient Instructions (Addendum)
Your physician has recommended you make the following change in your medication:   1.) the losartan has been increased from 50 mg daily to 75 mg (1 & 1/2 tab) daily for one week. Then take 50 mg twice daily, or 100 mg in the morning.  Your physician recommends that you return for a FASTING lipid profile and hepatic function panel in 2 weeks.    Your physician has requested that you have an echocardiogram. Echocardiography is a painless test that uses sound waves to create images of your heart. It provides your doctor with information about the size and shape of your heart and how well your heart's chambers and valves are working. This procedure takes approximately one hour. There are no restrictions for this procedure. This will be done at the [facility] location. [place]   Schedule an appointment for CPAP Titration and a 3 month follow-up appointment with Dr. Claiborne Billings.

## 2016-06-11 NOTE — Progress Notes (Signed)
Patient ID: [name], female   DOB: [birth year], 71 y.o.   MRN: [id number]   Primary M.D.: Dr. [name]  HPI: [name] is a 71 year-old female who presents for a 6 month follow-up cardiology evaluation.    [name] admits to a history of CAD, diabetes mellitus, peripheral neuropathy, and hypertension.   She underwent CABG revascularization surgery x2 with a maze procedure for atrial fibrillation at that time by Dr. Roxy Manns.  She experienced 2 episodes of palpitations where she felt her heart was temporarily out of rhythm. She denied associated chest tightness. She does note some shortness of breath. . A 2-D echo Doppler study on 10/05/2012 showed normal systolic function with an ejection fraction of 55-60% with mild tissue Doppler abnormality. Her aortic valve was trileaflet and sclerotic with very mild stenosis with a mean gradient of 10 and a peak gradient of 20 mm respectively. There is trivial AR. She also had posterior calcified mitral annulus with mild MR.  A nuclear perfusion study in 2014 revealed mild breast attenuation but otherwise normal perfusion.   She underwent permanent pacemaker insertion by Dr. Crissie Sickles.  She also has had issues with kidney stones and underwent 2 urological procedures in June and July 2016 on her left kidney by Dr. Nicolette Bang.  She denies recent chest pressure.  She had recent blood work drawn by Dr. [name].  These are noted below in the laboratory section.  She notes some mild shortness of breath and mild leg swelling.  She denies any awareness of palpitations.  She had gone into atrial fibrillation in March and again in June 2017 which occurred at night after drinking cold juice.  She does snore.  She was hospitalized overnight last on June 20 she presented with AF with a rapid ventricular rate at 129.  She was started on IV diltiazem with ultimate improvement and eventually her oral dose of Cardizem was increased.  Troponins were  negative.  An echo during that hospitalization showed an EF of 60-65%.  There was normal wall motion.  There was mild aortic valve stenosis and she had a mean gradient of 10 and peak gradient of 20 with a valve area of 1.35.  She was seen in follow-up by [name] and was remaining stable.  She saw Dr. [name] on 12/04/2015 and had normal pacemaker function and was maintaining normal sinus rhythm over 99% of the time on her current dose of amiodarone 200 mg.    When I last saw her, she was in atrial he paced rhythm.  The cardia vessel.  Her comorbidities and recurrent AF.  I recommended that she undergo a sleep study to evaluate for sleep apnea.  This was done on 01/22/2016 and revealed moderate sleep apnea overall within HI 15.4.  However, sleep apnea was severe during rems sleep at 36 per hour.  Her oxygen nadir was 83%.  She was scheduled to undergo a CPAP titration, but she canceled this appointment in January due to some other conflict.  She is not yet had a follow-up study.  She developed some edema, and stop taking diltiazem.  She believes her shortness of breath is better off diltiazem.  She denies chest pain.  She continues to take eloquence for anticoagulation and continues to be on amiodarone 200 mg for her PAF.  She presents for evaluation.  Past Medical History:  Diagnosis Date  . Asthmatic bronchitis   . CHF (congestive heart failure) ([place]) dx'd 2016  .  Coronary atherosclerosis of native coronary artery    a. s/p CABG 2005 Dr Roxy Manns    . Hyperlipidemia   . Hypertension   . Hypertensive heart disease   . Kidney stones   . Mixed hyperlipidemia   . Paroxysmal atrial fibrillation ([place])    a. s/p Maze in 2005 b. recurrence in March 2016 with >8sec posttermination pauses s/p PPM implant    . Pneumonia 1950   "double"  . Presence of permanent cardiac pacemaker   . Sleep apnea    "tested; mask ordered; couldn't afford it; will get it now" (09/04/2015)  . TIA (transient ischemic attack)   .  Type II diabetes mellitus ([place])   . Uterine cancer [facility])    age 72 with partial hysterectomy    Past Surgical History:  Procedure Laterality Date  . CARDIAC CATHETERIZATION  2005  . CORONARY ARTERY BYPASS GRAFT  2005   Dr Roxy Manns with MAZE  . CYSTOSCOPY WITH URETEROSCOPY, STONE BASKETRY AND STENT PLACEMENT Left 09/08/2014   Procedure: CYSTOSCOPY WITH URETEROSCOPY, STONE BASKETRY AND STENT PLACEMENT;  Surgeon: Cleon Gustin, MD;  Location: [facility];  Service: Urology;  Laterality: Left;  . CYSTOSCOPY/URETEROSCOPY/HOLMIUM LASER/STENT PLACEMENT Left 09/15/2014   Procedure: CYSTOSCOPY/RETROGRADE/URETEROSCOPY/HOLMIUM LASER/ STONE EXTRACTION /STENT EXCHANGE;  Surgeon: Cleon Gustin, MD;  Location: [facility];  Service: Urology;  Laterality: Left;  . HOLMIUM LASER APPLICATION Left 11/17/4095   Procedure: HOLMIUM LASER APPLICATION;  Surgeon: Cleon Gustin, MD;  Location: [facility];  Service: Urology;  Laterality: Left;  . HOLMIUM LASER APPLICATION Left 05/19/3297   Procedure: HOLMIUM LASER APPLICATION;  Surgeon: Cleon Gustin, MD;  Location: [facility];  Service: Urology;  Laterality: Left;  . INSERT / REPLACE / REMOVE PACEMAKER    . LAPAROSCOPIC CHOLECYSTECTOMY    . 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 VAGINAL HYSTERECTOMY  1975    No Known Allergies  Current Outpatient Prescriptions  Medication Sig Dispense Refill  . acetaminophen (TYLENOL) 500 MG tablet Take 500-1,000 mg by mouth every 6 (six) hours as needed for moderate pain.    [name] amiodarone (PACERONE) 200 MG tablet Take 1 tablet (200 mg total) by mouth daily. 30 tablet 9  . apixaban (ELIQUIS) 5 MG TABS tablet Take 1 tablet (5 mg total) by mouth 2 (two) times daily. Take 1st dose 06-07-14 60 tablet 1  . aspirin 81 MG tablet Take 81 mg by mouth daily.     [name] atorvastatin (LIPITOR) 10 MG tablet Take 1 tablet (10 mg total) by mouth daily. 90 tablet 3    . B Complex-Biotin-FA (B-COMPLEX PO) Take 1 capsule by mouth every morning.     [name] BIOTIN PO Take 1 tablet by mouth daily.    . Cholecalciferol (VITAMIN D3) 1000 UNITS CAPS Take 1,000 Units by mouth daily.    . Coenzyme Q10 (CO Q-10) 200 MG CAPS Take 200 mg by mouth daily.    [name] diltiazem (CARDIZEM CD) 120 MG 24 hr capsule Take 1 capsule (120 mg total) by mouth at bedtime. 90 capsule 3  . diphenhydramine-acetaminophen (TYLENOL PM) 25-500 MG TABS tablet Take 1 tablet by mouth at bedtime as needed (sleep).    . fenofibrate 160 MG tablet Take 1 tablet (160 mg total) by mouth daily. 90 tablet 3  . furosemide (LASIX) 40 MG tablet Take 40 mg by mouth daily. Can take 1 extra tablet by mouth daily as needed for  swelling/edema    . gabapentin (NEURONTIN) 300 MG capsule Take 300 mg by mouth at bedtime.     . GuaiFENesin (TUSSIN PO) Take by mouth 2 (two) times daily as needed (Cough/congestion). Pt unsure of strength    . insulin aspart protamine- aspart (NOVOLOG MIX 70/30) (70-30) 100 UNIT/ML injection Inject 35 Units into the skin 2 (two) times daily with a meal.     . losartan (COZAAR) 50 MG tablet Take 75 mg (1/2 tab) daily for one week. Then take 50 mg twice daily. 180 tablet 3  . metFORMIN (GLUCOPHAGE) 500 MG tablet Take 500 mg by mouth 2 (two) times daily with a meal.     . metoprolol succinate (TOPROL XL) 100 MG 24 hr tablet Take 1 tablet (100 mg total) by mouth daily. Take with or immediately following a meal. 90 tablet 3  . NEOMYCIN-POLYMYXIN-HYDROCORTISONE (CORTISPORIN) 1 % SOLN otic solution Place 4 drops into both ears 2 (two) times daily as needed. For pain and itching    . Omega-3 Fatty Acids (FISH OIL PO) Take 2 capsules by mouth 2 (two) times daily.    . potassium chloride (K-DUR,KLOR-CON) 10 MEQ tablet Take 10 mEq by mouth every morning.      No current facility-administered medications for this visit.     Socially she is married to [employer]; she has 2 children one grandchild. She  is retired. She completed ninth grade education. Has no history of tobacco use. She is not drink alcohol. She does walk approximately 2-3 times per week.  Family History  Problem Relation Age of Onset  . Hypertension Mother   . Hyperlipidemia Mother   . Heart disease Mother   . Diabetes Father   . Stroke Father   . Heart disease Father   . Hypertension Sister     3 sisters  . Arthritis Brother   . Heart attack Maternal Grandfather   . Stroke Paternal Grandfather   . Hypertension Daughter    ROS General: Negative; No fevers, chills, or night sweats;  HEENT: Negative; No changes in vision or hearing, sinus congestion, difficulty swallowing Pulmonary: Negative; No cough, wheezing, shortness of breath, hemoptysis Cardiovascular: He history of present illness GI: Negative; No nausea, vomiting, diarrhea, or abdominal pain GU: Positive for recent left uteroscopic stone manipulation with laser lithotripsy for left kidney stone Musculoskeletal: Negative; no myalgias, joint pain, or weakness Hematologic/Oncology: Negative; no easy bruising, bleeding Endocrine: Positive for diabetes mellitus Neuro: Negative; no changes in balance, headaches Skin: Negative; No rashes or skin lesions Psychiatric: Negative; No behavioral problems, depression Sleep: Positive for snoring, no daytime sleepiness, hypersomnolence, bruxism, restless legs, hypnogognic hallucinations, no cataplexy Other comprehensive 14 point system review is negative.   PE BP 132/70   Pulse 81   Ht _0  (1.626 m)   Wt 256 lb 12.8 oz (116.5 kg)   BMI 44.08 kg/m    Repeat blood pressure 138/76  Wt Readings from Last 3 Encounters:  06/11/16 256 lb 12.8 oz (116.5 kg)  01/22/16 250 lb (113.4 kg)  12/13/15 249 lb 6 oz (113.1 kg)   General: Alert, oriented, no distress. Normocephalic atraumatic.  Skin: normal turgor, no rashes HEENT: Normocephalic, atraumatic. Pupils round and reactive; sclera anicteric; bilateral  xanthelasmas. Extraocular muscles are intact. Fundi mild arteriolar narrowing without hemorrhages or exudates. Nose without nasal septal hypertrophy Mouth/Parynx benign; Mallinpatti scale 3. Neck: No JVD, no carotid bruits with normal carotid upstroke Lungs: clear to ausculatation and percussion; no wheezing or rales Heart:  RRR, s1 s2 normal 1/6 systolic murmur aortic area.;  No diastolic murmur; no rubs thrills or heaves Abdomen: Moderate obesity;soft, nontender; no hepatosplenomehaly, BS+; abdominal aorta nontender and not dilated by palpation. Back: No CVA tenderness Pulses 2+; no bruits. Extremities: Trace pretibial edema bilaterally, no clubbing cyanosis, Homan's sign negative  Neurologic: grossly nonfocal Psychologic: Normal affect and mood.  ECG (independently read by me): Atrially paced rhythm at 81 bpm.  Prolonged AV conduction with a PR interval at 258 ms.  QTc interval 473 ms.  Nonspecific ST-T changes.  September 2017 ECG (independently read by me): Atrially paced rhythm with a PR interval at 248.  Complete right bundle branch block.  QTc interval 458 ms.  March 2017 ECG (independently read by me):  Atrially paced rhythm with prolonged AV conduction with PR interval 232 ms.  Nonspecific ST-T changes.  September 2016 ECG (independently read by me): A paced rhythm at 63 with prolonged AV conduction with a PR 214.  QTC normal at 446 ms.  July 2015 ECG (independently read by me): Sinus rhythm with short PR 88 ms.  Nonspecific T changes.  Incomplete right bundle branch block  10/2013 ECG: Sinus rhythm at 69 beats per minute. QTc interval 445 ms.  LABS: BMP Latest Ref Rng & Units 09/05/2015 09/04/2015 06/08/2015  Glucose 65 - 99 mg/dL 275(H) 249(H) 172(H)  BUN 6 - 20 mg/dL _0 Creatinine 0.44 - 1.00 mg/dL 0.90 0.85 0.78  Sodium 135 - 145 mmol/L 136 135 139  Potassium 3.5 - 5.1 mmol/L 3.7 3.9 3.6  Chloride 101 - 111 mmol/L 100(L) 101 106  CO2 22 - 32 mmol/L _1 Calcium  8.9 - 10.3 mg/dL 9.4 9.3 8.9   Hepatic Function Latest Ref Rng & Units 09/05/2015 06/08/2015 06/04/2014  Total Protein 6.5 - 8.1 g/dL 6.8 6.1(L) 6.9  Albumin 3.5 - 5.0 g/dL 3.7 3.2(L) 3.9  AST 15 - 41 U/L 33 34 36  ALT 14 - 54 U/L _2 Alk Phosphatase 38 - 126 U/L 47 41 65  Total Bilirubin 0.3 - 1.2 mg/dL 0.5 0.7 0.5  Bilirubin, Direct 0.1 - 0.5 mg/dL 0.2 - 0.2   CBC Latest Ref Rng & Units 09/04/2015 06/07/2015 09/08/2014  WBC 4.0 - 10.5 K/uL 10.2 8.4 9.9  Hemoglobin 12.0 - 15.0 g/dL 12.7 12.3 12.3  Hematocrit 36.0 - 46.0 % 39.3 37.2 37.5  Platelets 150 - 400 K/uL 306 283 313   Lab Results  Component Value Date   MCV 91.4 09/04/2015   MCV 90.1 06/07/2015   MCV 89.9 09/08/2014   Lab Results  Component Value Date   TSH 2.088 09/05/2015   Lab Results  Component Value Date   HGBA1C 8.9 (H) 09/04/2015   Lipid Panel     Component Value Date/Time   CHOL 150 09/05/2015 0528   TRIG 235 (H) 09/05/2015 0528   HDL 33 (L) 09/05/2015 0528   CHOLHDL 4.5 09/05/2015 0528   VLDL 47 (H) 09/05/2015 0528   LDLCALC 70 09/05/2015 0528   Laboratory from Dr. Hughie Closs office from   February 22,017. Lipid studies revealed a total cholesterol 186, LDL cholesterol 80, triglycerides 333 and VLDL cholesterol 67. Her glucose was 283.   Laboratory from Dr. Hughie Closs office from 11/27/2014: Fasting glucose 220, BUN 16, creatinine 0.66.  CO2 30.5.  Hemoglobin 12.1, hematocrit 37.3.  Hemoglobin A1c 9.4.  Total cholesterol 204, triglycerides 261, HDL 44, LDL 108, VLDL 52.   RADIOLOGY: No  results found.  IMPRESSION:  1. Paroxysmal atrial fibrillation (H[facility])   2. Hypertensive heart disease with heart failure ([place])   3. Type 2 diabetes mellitus without complication, with long-term current use of insulin ([place])   4. Mixed hyperlipidemia   5. Aortic valve stenosis, etiology of cardiac valve disease unspecified   6. OSA (obstructive sleep apnea)   7. Morbid obesity ([place])   8. Chronic  anticoagulation     ASSESSMENT AND PLAN:    [name] is a 71 year old female who has a history of type 2 diabetes mellitus and is status post CABG surgery x2 by Dr. Roxy Manns in 2005. She has  a history of atrial fibrillation and underwent a Maze procedure at the time of her surgery.  She required permanent pacemaker insertion which was done by Dr. [name].  She continues to beon eliquis for anticoagulation.  Her ECG today shows an atrially paced rhythm without recurrent AF.  Her cha2ds2Vasc score is5 by virtue of her female sex, age, CAD, diabetes mellitus and hypertensive history. Her diabetes is not well controlled, as manifested by her hemoglobin A1c.  She has an atherogenic dyslipidemic lipid panel and is on fenofibrate  160 mg daily and has xanthelasmas on physical examination.  She is maintaining sinus rhythm and is atrially paced.  On amiodarone 200 mg daily, metoprolol, succinate 100 mg daily, and losartan 50 mg in addition to furosemide, both for her blood pressure and rhythm.   I recommended further titration of losartan to 75 mg for one week and then she will change this to 50 mg twice a day She is on eliquis anticoagulation without bleeding.  Her peripheral edema has improved off diltiazem. She is diabetic on metformin.  She is morbidly obese and weight loss was highly recommended.  I reviewed her sleep study in detail, which confirms moderate sleep apnea.  Overall, but severe sleep apnea with rim sleep.  I discussed the importance of pursuing the CPAP titration trial and discussed the adverse consequences with reference to her cardiovascular risk.  If her sleep apnea is untreated.  Specifically, I discussed the significant increased rate of recurrent atrial fibrillation.  I schedule her for her CPAP titration trial.  I will see her in several months for reevaluation. Time spent: 25 minutes   Troy Sine, MD, [facility] 06/11/2016 6:55 PM

## 2016-06-13 NOTE — Addendum Note (Signed)
Addended by: Milderd Meager on: 06/13/2016 04:17 PM   Modules accepted: Orders

## 2016-06-19 ENCOUNTER — Telehealth: Payer: Self-pay | Admitting: Cardiology

## 2016-06-19 ENCOUNTER — Ambulatory Visit (INDEPENDENT_AMBULATORY_CARE_PROVIDER_SITE_OTHER): Payer: Medicare Other | Admitting: *Deleted

## 2016-06-19 DIAGNOSIS — I495 Sick sinus syndrome: Secondary | ICD-10-CM | POA: Diagnosis not present

## 2016-06-19 NOTE — Telephone Encounter (Signed)
LMOVM reminding pt to send remote transmission.

## 2016-06-20 ENCOUNTER — Encounter: Payer: Self-pay | Admitting: Cardiology

## 2016-06-20 LAB — CUP PACEART REMOTE DEVICE CHECK
Brady Statistic AP VS Percent: 90.79 %
Brady Statistic AS VP Percent: 0 %
Brady Statistic AS VS Percent: 8.77 %
Brady Statistic RV Percent Paced: 0.45 %
Date Time Interrogation Session: 20180405225043
Implantable Lead Location: 753859
Implantable Lead Location: 753860
Implantable Lead Model: 5076
Lead Channel Impedance Value: 361 Ohm
Lead Channel Pacing Threshold Amplitude: 0.625 V
Lead Channel Pacing Threshold Amplitude: 0.625 V
Lead Channel Pacing Threshold Pulse Width: 0.4 ms
Lead Channel Pacing Threshold Pulse Width: 0.4 ms
Lead Channel Sensing Intrinsic Amplitude: 1.375 mV
Lead Channel Sensing Intrinsic Amplitude: 16.5 mV
Lead Channel Sensing Intrinsic Amplitude: 16.5 mV
Lead Channel Setting Pacing Amplitude: 2.5 V
Lead Channel Setting Pacing Pulse Width: 0.4 ms
MDC IDC LEAD IMPLANT DT: 20160321
MDC IDC LEAD IMPLANT DT: 20160321
MDC IDC MSMT BATTERY REMAINING LONGEVITY: 93 mo
MDC IDC MSMT BATTERY VOLTAGE: 3.01 V
MDC IDC MSMT LEADCHNL RA IMPEDANCE VALUE: 475 Ohm
MDC IDC MSMT LEADCHNL RA SENSING INTR AMPL: 1.375 mV
MDC IDC MSMT LEADCHNL RV IMPEDANCE VALUE: 551 Ohm
MDC IDC MSMT LEADCHNL RV IMPEDANCE VALUE: 589 Ohm
MDC IDC PG IMPLANT DT: 20160321
MDC IDC SET LEADCHNL RA PACING AMPLITUDE: 2 V
MDC IDC SET LEADCHNL RV SENSING SENSITIVITY: 0.9 mV
MDC IDC STAT BRADY AP VP PERCENT: 0.43 %
MDC IDC STAT BRADY RA PERCENT PACED: 91.13 %

## 2016-06-20 NOTE — Progress Notes (Signed)
Remote pacemaker transmission.

## 2016-07-03 ENCOUNTER — Ambulatory Visit (HOSPITAL_COMMUNITY): Payer: Medicare Other | Attending: Cardiology

## 2016-07-03 ENCOUNTER — Other Ambulatory Visit: Payer: Self-pay

## 2016-07-03 DIAGNOSIS — I050 Rheumatic mitral stenosis: Secondary | ICD-10-CM | POA: Insufficient documentation

## 2016-07-03 DIAGNOSIS — I350 Nonrheumatic aortic (valve) stenosis: Secondary | ICD-10-CM

## 2016-07-03 MED ORDER — PERFLUTREN LIPID MICROSPHERE
1.0000 mL | INTRAVENOUS | Status: AC | PRN
  Administered 2016-07-03: 2 mL via INTRAVENOUS

## 2016-07-07 ENCOUNTER — Encounter: Payer: Self-pay | Admitting: *Deleted

## 2016-07-30 ENCOUNTER — Ambulatory Visit (HOSPITAL_BASED_OUTPATIENT_CLINIC_OR_DEPARTMENT_OTHER): Payer: Medicare Other | Attending: Cardiovascular Disease | Admitting: Cardiovascular Disease

## 2016-07-30 VITALS — Ht 64.0 in | Wt 253.0 lb

## 2016-07-30 DIAGNOSIS — G4733 Obstructive sleep apnea (adult) (pediatric): Secondary | ICD-10-CM | POA: Diagnosis not present

## 2016-07-30 DIAGNOSIS — G4761 Periodic limb movement disorder: Secondary | ICD-10-CM

## 2016-08-29 NOTE — Procedures (Signed)
Patient Name: [name], [name] Date: 07/30/2016 Gender: Female D.O.B: 08/18/1945 Age (years): 15 Referring Provider: Shelva Majestic MD, ABSM Height (inches): 64 Interpreting Physician: Shelva Majestic MD, ABSM Weight (lbs): 253 RPSGT: Carolin Coy BMI: 81 MRN: [id number] Neck Size: 15.00  CLINICAL INFORMATION The patient is referred for a CPAP titration to treat sleep apnea.  Date of NPSG:  01/22/2016: AHI 15.4/h; RDI 19.7/h;  AHI during REM sleep 36/h. O2 desaturation nadir 83%.  SLEEP STUDY TECHNIQUE As per the AASM Manual for the Scoring of Sleep and Associated Events v2.3 (April 2016) with a hypopnea requiring 4% desaturations.  The channels recorded and monitored were frontal, central and occipital EEG, electrooculogram (EOG), submentalis EMG (chin), nasal and oral airflow, thoracic and abdominal wall motion, anterior tibialis EMG, snore microphone, electrocardiogram, and pulse oximetry. Continuous positive airway pressure (CPAP) was initiated at the beginning of the study and titrated to treat sleep-disordered breathing.  MEDICATIONS acetaminophen (TYLENOL) 500 MG tablet amiodarone (PACERONE) 200 MG tablet apixaban (ELIQUIS) 5 MG TABS tablet aspirin 81 MG tablet atorvastatin (LIPITOR) 10 MG tablet B Complex-Biotin-FA (B-COMPLEX PO) BIOTIN PO Cholecalciferol (VITAMIN D3) 1000 UNITS CAPS Coenzyme Q10 (CO Q-10) 200 MG CAPS diltiazem (CARDIZEM CD) 120 MG 24 hr capsule diphenhydramine-acetaminophen (TYLENOL PM) 25-500 MG TABS tablet fenofibrate 160 MG tablet furosemide (LASIX) 40 MG tablet gabapentin (NEURONTIN) 300 MG capsule GuaiFENesin (TUSSIN PO) insulin aspart protamine- aspart (NOVOLOG MIX 70/30) (70-30) 100 UNIT/ML injection losartan (COZAAR) 50 MG tablet metFORMIN (GLUCOPHAGE) 500 MG tablet metoprolol succinate (TOPROL XL) 100 MG 24 hr tablet NEOMYCIN-POLYMYXIN-HYDROCORTISONE (CORTISPORIN) 1 % SOLN otic solution Omega-3 Fatty Acids (FISH OIL  PO)  Medications self-administered by patient taken the night of the study : TYLENOL PM  TECHNICIAN COMMENTS Comments added by technician: PATIENT WAS ORDERED AS A CPAP TITRATION.   RESPIRATORY PARAMETERS Optimal PAP Pressure (cm): 15 AHI at Optimal Pressure (/hr): 0.0 Overall Minimal O2 (%): 89.00 Supine % at Optimal Pressure (%): 0 Minimal O2 at Optimal Pressure (%): 93.0    SLEEP ARCHITECTURE The study was initiated at 10:38:55 PM and ended at 4:51:16 AM.  Sleep onset time was 18.9 minutes and the sleep efficiency was 46.3%. The total sleep time was 172.5 minutes.  The patient spent 22.03% of the night in stage N1 sleep, 58.84% in stage N2 sleep, 0.00% in stage N3 and 19.13% in REM.Stage REM latency was 318.0 minutes  Wake after sleep onset was 180.9. Alpha intrusion was absent. Supine sleep was 0.00%.  CARDIAC DATA The 2 lead EKG demonstrated sinus rhythm. The mean heart rate was 60.44 beats per minute. Other EKG findings include: PVCs.  LEG MOVEMENT DATA The total Periodic Limb Movements of Sleep (PLMS) were 246. The PLMS index was 85.57. A PLMS index of <15 is considered normal in adults.  IMPRESSIONS - CPAP was instituted at 5 cm water pressure and was titrated up to 15 cm pressure. The optimal PAP pressure was 15 cm of water. - Central sleep apnea was not noted during this titration (CAI = 0.0/h). - Mild oxygen desaturations were observed during this titration (min O2 = 89.00%). - The patient snored with Moderate snoring volume during this titration study. - 2-lead EKG demonstrated: PVCs - Severe periodic limb movements were observed with a PLMS index of 85.57/h.  Arousals associated with PLMs were significant.  DIAGNOSIS - Obstructive Sleep Apnea (327.23 [G47.33 ICD-10]) - Periodic limb movement disorder of sleep  RECOMMENDATIONS - Recommend an initial trial of CPAP therapy with EPR of 3 at  15 cm H2O with heated humidification. A Small size Fisher[employer]Paykel Full Face  Mask Simplus mask was used for the titration study. - Effort should be made to optimize nasal and oropharyngeal patency. - Avoid alcohol, sedatives and other CNS depressants that may worsen sleep apnea and disrupt normal sleep architecture. - Sleep hygiene should be reviewed to assess factors that may improve sleep quality. - With a PLMS index of 85.57/h, consider a trial of pharmacotherapy if patient is symptomatically with restless legs. - Weight management and regular exercise should be initiated or continued. - Recommend a download be obtained in 30 days and a sleep clinic evaluation evaluation after 4 weeks of therapy.  [Electronically signed] 08/29/2016 06:48 PM  Shelva Majestic MD, [facility], [place], American Board of Sleep Medicine   NPI: [id number] [place] PH: [phone]   FX: [phone] [place]

## 2016-09-05 ENCOUNTER — Telehealth: Payer: Self-pay | Admitting: *Deleted

## 2016-09-05 NOTE — Telephone Encounter (Signed)
Left message for patient, CPAP orders and documentation faxed to [facility].

## 2016-09-08 LAB — HEPATIC FUNCTION PANEL
ALBUMIN: 4 g/dL (ref 3.6–5.1)
ALT: 26 U/L (ref 6–29)
AST: 44 U/L — ABNORMAL HIGH (ref 10–35)
Alkaline Phosphatase: 48 U/L (ref 33–130)
Bilirubin, Direct: 0.1 mg/dL (ref <=0.2)
Indirect Bilirubin: 0.2 mg/dL (ref 0.2–1.2)
TOTAL PROTEIN: 6.9 g/dL (ref 6.1–8.1)
Total Bilirubin: 0.3 mg/dL (ref 0.2–1.2)

## 2016-09-08 LAB — LIPID PANEL
CHOL/HDL RATIO: 4.7 ratio (ref <5.0)
Cholesterol: 218 mg/dL — ABNORMAL HIGH (ref <200)
HDL: 46 mg/dL — AB (ref >50)
LDL CALC: 130 mg/dL — AB (ref <100)
TRIGLYCERIDES: 212 mg/dL — AB (ref <150)
VLDL: 42 mg/dL — ABNORMAL HIGH (ref <30)

## 2016-09-12 ENCOUNTER — Encounter: Payer: Self-pay | Admitting: Cardiovascular Disease

## 2016-09-12 ENCOUNTER — Ambulatory Visit (INDEPENDENT_AMBULATORY_CARE_PROVIDER_SITE_OTHER): Payer: Medicare Other | Admitting: Cardiovascular Disease

## 2016-09-12 VITALS — BP 110/72 | HR 83 | Ht 64.0 in | Wt 247.0 lb

## 2016-09-12 DIAGNOSIS — Z7901 Long term (current) use of anticoagulants: Secondary | ICD-10-CM

## 2016-09-12 DIAGNOSIS — Z794 Long term (current) use of insulin: Secondary | ICD-10-CM | POA: Diagnosis not present

## 2016-09-12 DIAGNOSIS — I110 Hypertensive heart disease with heart failure: Secondary | ICD-10-CM

## 2016-09-12 DIAGNOSIS — I350 Nonrheumatic aortic (valve) stenosis: Secondary | ICD-10-CM | POA: Diagnosis not present

## 2016-09-12 DIAGNOSIS — I050 Rheumatic mitral stenosis: Secondary | ICD-10-CM | POA: Diagnosis not present

## 2016-09-12 DIAGNOSIS — G4733 Obstructive sleep apnea (adult) (pediatric): Secondary | ICD-10-CM

## 2016-09-12 DIAGNOSIS — I480 Paroxysmal atrial fibrillation: Secondary | ICD-10-CM

## 2016-09-12 DIAGNOSIS — E782 Mixed hyperlipidemia: Secondary | ICD-10-CM | POA: Diagnosis not present

## 2016-09-12 DIAGNOSIS — E119 Type 2 diabetes mellitus without complications: Secondary | ICD-10-CM | POA: Diagnosis not present

## 2016-09-12 NOTE — Patient Instructions (Signed)
A referral will be done for you to receive a CPAP machine. Once the company has gotten approval for your equipment they will contact her for set up.   Your physician recommends that you schedule a follow-up appointment 2 months after the set up and use of your CPAP machine.

## 2016-09-12 NOTE — Progress Notes (Signed)
Patient ID: [name], female   DOB: 03/26/1945, 71 y.o.   MRN: [id number]   Primary M.D.: Dr. [name]  HPI: [name] is a 71 year-old female who presents for a 3 month follow-up cardiology evaluation.    [name] admits to a history of CAD, diabetes mellitus, peripheral neuropathy, and hypertension.   She underwent CABG revascularization surgery x2 with a maze procedure for atrial fibrillation at that time by Dr. Roxy Manns.  She experienced 2 episodes of palpitations where she felt her heart was temporarily out of rhythm. She denied associated chest tightness. She does note some shortness of breath. . A 2-D echo Doppler study on 10/05/2012 showed normal systolic function with an ejection fraction of 55-60% with mild tissue Doppler abnormality. Her aortic valve was trileaflet and sclerotic with very mild stenosis with a mean gradient of 10 and a peak gradient of 20 mm respectively. There is trivial AR. She also had posterior calcified mitral annulus with mild MR.  A nuclear perfusion study in 2014 revealed mild breast attenuation but otherwise normal perfusion.   She underwent permanent pacemaker insertion by Dr. Crissie Sickles.  She also has had issues with kidney stones and underwent 2 urological procedures in June and July 2016 on her left kidney by Dr. Nicolette Bang.  She denies recent chest pressure.  She had recent blood work drawn by Dr. [name].  These are noted below in the laboratory section.  She notes some mild shortness of breath and mild leg swelling.  She denies any awareness of palpitations.  She had gone into atrial fibrillation in March and again in June 2017 which occurred at night after drinking cold juice.  She does snore.  She was hospitalized overnight last on June 20 she presented with AF with a rapid ventricular rate at 129.  She was started on IV diltiazem with ultimate improvement and eventually her oral dose of Cardizem was increased.  Troponins were  negative.  An echo during that hospitalization showed an EF of 60-65%.  There was normal wall motion.  There was mild aortic valve stenosis and she had a mean gradient of 10 and peak gradient of 20 with a valve area of 1.35.  She was seen in follow-up by [name] and was remaining stable.  She saw Dr. [name] on 12/04/2015 and had normal pacemaker function and was maintaining normal sinus rhythm over 99% of the time on her current dose of amiodarone 200 mg.    With her cardiovascular comorbidities and recurrent AF  I recommended that she undergo a sleep study to evaluate for sleep apnea.  This was done on 01/22/2016 and revealed moderate sleep apnea overall within AHI 15.4.  However, sleep apnea was severe during rems sleep at 36 per hour.  Her oxygen nadir was 83%.  She was scheduled to undergo a CPAP titration, but she canceled this appointment in January due to some other conflict.  When I last saw her, she had not yet had her CPAP titration.  I strongly recommended that she have this done.  He Pap titration was done on 07/30/2016 and she was titrated up to 15 cm water pressure.  She had a significant PLMS index of 85.6.  She has been on gabapentin for neuropathy, which will also treat restless legs.  She underwent an echo Doppler study on 07/03/2016 which showed an EF of 60-65%.  She had grade 2 diastolic dysfunction.  She had an upper normal to mildly increased.  The ascending aorta and a thickened mitral valve suggesting moderate stenosis.  There was a mild gradient across her aortic valve.  She had developed mild edema on diltiazem, which improved once this was discontinued.  She denies chest pain.  She continues to take eliquis for anticoagulation and continues to be on amiodarone 200 mg for her PAF.  She presents for evaluation.  Past Medical History:  Diagnosis Date  . Asthmatic bronchitis   . CHF (congestive heart failure) ([place]) dx'd 2016  . Coronary atherosclerosis of native coronary artery     a. s/p CABG 2005 Dr Roxy Manns    . Hyperlipidemia   . Hypertension   . Hypertensive heart disease   . Kidney stones   . Mixed hyperlipidemia   . Paroxysmal atrial fibrillation ([place])    a. s/p Maze in 2005 b. recurrence in March 2016 with >8sec posttermination pauses s/p PPM implant    . Pneumonia 1950   "double"  . Presence of permanent cardiac pacemaker   . Sleep apnea    "tested; mask ordered; couldn't afford it; will get it now" (09/04/2015)  . TIA (transient ischemic attack)   . Type II diabetes mellitus ([place])   . Uterine cancer [facility])    age 17 with partial hysterectomy    Past Surgical History:  Procedure Laterality Date  . CARDIAC CATHETERIZATION  2005  . CORONARY ARTERY BYPASS GRAFT  2005   Dr Roxy Manns with MAZE  . CYSTOSCOPY WITH URETEROSCOPY, STONE BASKETRY AND STENT PLACEMENT Left 09/08/2014   Procedure: CYSTOSCOPY WITH URETEROSCOPY, STONE BASKETRY AND STENT PLACEMENT;  Surgeon: Cleon Gustin, MD;  Location: [facility];  Service: Urology;  Laterality: Left;  . CYSTOSCOPY/URETEROSCOPY/HOLMIUM LASER/STENT PLACEMENT Left 09/15/2014   Procedure: CYSTOSCOPY/RETROGRADE/URETEROSCOPY/HOLMIUM LASER/ STONE EXTRACTION /STENT EXCHANGE;  Surgeon: Cleon Gustin, MD;  Location: [facility];  Service: Urology;  Laterality: Left;  . HOLMIUM LASER APPLICATION Left 06/23/1446   Procedure: HOLMIUM LASER APPLICATION;  Surgeon: Cleon Gustin, MD;  Location: [facility];  Service: Urology;  Laterality: Left;  . HOLMIUM LASER APPLICATION Left 03/24/5629   Procedure: HOLMIUM LASER APPLICATION;  Surgeon: Cleon Gustin, MD;  Location: [facility];  Service: Urology;  Laterality: Left;  . INSERT / REPLACE / REMOVE PACEMAKER    . LAPAROSCOPIC CHOLECYSTECTOMY    . 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 VAGINAL HYSTERECTOMY  1975    No Known Allergies  Current Outpatient Prescriptions  Medication Sig Dispense Refill  .  acetaminophen (TYLENOL) 500 MG tablet Take 500-1,000 mg by mouth every 6 (six) hours as needed for moderate pain.    [name] amiodarone (PACERONE) 200 MG tablet Take 1 tablet (200 mg total) by mouth daily. 30 tablet 9  . apixaban (ELIQUIS) 5 MG TABS tablet Take 1 tablet (5 mg total) by mouth 2 (two) times daily. Take 1st dose 06-07-14 60 tablet 1  . aspirin 81 MG tablet Take 81 mg by mouth daily.     [name] atorvastatin (LIPITOR) 10 MG tablet Take 1 tablet (10 mg total) by mouth daily. 90 tablet 3  . B Complex-Biotin-FA (B-COMPLEX PO) Take 1 capsule by mouth every morning.     [name] BIOTIN PO Take 1 tablet by mouth daily.    . Cholecalciferol (VITAMIN D3) 1000 UNITS CAPS Take 1,000 Units by mouth daily.    . Coenzyme Q10 (CO Q-10) 200 MG CAPS Take 200 mg by mouth daily.    [name]  diphenhydramine-acetaminophen (TYLENOL PM) 25-500 MG TABS tablet Take 1 tablet by mouth at bedtime as needed (sleep).    . fenofibrate 160 MG tablet Take 1 tablet (160 mg total) by mouth daily. 90 tablet 3  . furosemide (LASIX) 40 MG tablet Take 40 mg by mouth daily. Can take 1 extra tablet by mouth daily as needed for swelling/edema    . gabapentin (NEURONTIN) 300 MG capsule Take 300 mg by mouth at bedtime.     . GuaiFENesin (TUSSIN PO) Take by mouth 2 (two) times daily as needed (Cough/congestion). Pt unsure of strength    . insulin aspart protamine- aspart (NOVOLOG MIX 70/30) (70-30) 100 UNIT/ML injection Inject 35 Units into the skin 2 (two) times daily with a meal.     . losartan (COZAAR) 50 MG tablet Take 75 mg (1/2 tab) daily for one week. Then take 50 mg twice daily. 180 tablet 3  . metFORMIN (GLUCOPHAGE) 500 MG tablet Take 500 mg by mouth 2 (two) times daily with a meal.     . metoprolol succinate (TOPROL XL) 100 MG 24 hr tablet Take 1 tablet (100 mg total) by mouth daily. Take with or immediately following a meal. 90 tablet 3  . NEOMYCIN-POLYMYXIN-HYDROCORTISONE (CORTISPORIN) 1 % SOLN otic solution Place 4 drops into both ears 2  (two) times daily as needed. For pain and itching    . Omega-3 Fatty Acids (FISH OIL PO) Take 2 capsules by mouth 2 (two) times daily.    . potassium chloride (K-DUR,KLOR-CON) 10 MEQ tablet Take 10 mEq by mouth every morning.      No current facility-administered medications for this visit.     Socially she is married to [employer]; she has 2 children one grandchild. She is retired. She completed ninth grade education. Has no history of tobacco use. She is not drink alcohol. She does walk approximately 2-3 times per week.  Family History  Problem Relation Age of Onset  . Hypertension Mother   . Hyperlipidemia Mother   . Heart disease Mother   . Diabetes Father   . Stroke Father   . Heart disease Father   . Hypertension Sister        3 sisters  . Arthritis Brother   . Heart attack Maternal Grandfather   . Stroke Paternal Grandfather   . Hypertension Daughter    ROS General: Negative; No fevers, chills, or night sweats;  HEENT: Negative; No changes in vision or hearing, sinus congestion, difficulty swallowing Pulmonary: Negative; No cough, wheezing, shortness of breath, hemoptysis Cardiovascular: He history of present illness GI: Negative; No nausea, vomiting, diarrhea, or abdominal pain GU: Positive for recent left uteroscopic stone manipulation with laser lithotripsy for left kidney stone Musculoskeletal: Negative; no myalgias, joint pain, or weakness Hematologic/Oncology: Negative; no easy bruising, bleeding Endocrine: Positive for diabetes mellitus Neuro: Negative; no changes in balance, headaches Skin: Negative; No rashes or skin lesions Psychiatric: Negative; No behavioral problems, depression Sleep: Positive for OSA, not yet started on CPAP therapy; snoring, no daytime sleepiness, hypersomnolence, bruxism, restless legs, hypnogognic hallucinations, no cataplexy Other comprehensive 14 point system review is negative.   PE BP 110/72   Pulse 83   Ht _0  (1.626 m)    Wt 247 lb (112 kg)   BMI 42.40 kg/m    Repeat blood pressure 124/74  Wt Readings from Last 3 Encounters:  09/12/16 247 lb (112 kg)  07/30/16 253 lb (114.8 kg)  06/11/16 256 lb 12.8 oz (116.5 kg)  Physical Exam BP 110/72   Pulse 83   Ht _0  (1.626 m)   Wt 247 lb (112 kg)   BMI 42.40 kg/m  General: Alert, oriented, no distress.  Skin: normal turgor, no rashes, warm and dry HEENT: Normocephalic, atraumatic. Pupils equal round and reactive to light; sclera anicteric; extraocular muscles intact; Nose without nasal septal hypertrophy Mouth/Parynx benign; Mallinpatti scale 3 Neck: No JVD, no carotid bruits; normal carotid upstroke Lungs: clear to ausculatation and percussion; no wheezing or rales Chest wall: without tenderness to palpitation Heart: PMI not displaced, RRR, s1 s2 normal, 2/6 systolic murmur in the aortic area.  No diastolic rumble, no diastolic murmur, no rubs, gallops, thrills, or heaves Abdomen: soft, nontender; no hepatosplenomehaly, BS+; abdominal aorta nontender and not dilated by palpation. Back: no CVA tenderness Pulses 2+ Musculoskeletal: full range of motion, normal strength, no joint deformities Extremities: Trivial any residual ankle edema no clubbing cyanosis, Homan's sign negative  Neurologic: grossly nonfocal; Cranial nerves grossly wnl Psychologic: Normal mood and affect  ECG (independently read by me): Atrially paced rhythm at 83 bpm with prolonged AV conduction with a PR interval 284 ms.  QTc interval 484 ms.  Nonspecific ST changes.  March 2018 ECG (independently read by me): Atrially paced rhythm at 81 bpm.  Prolonged AV conduction with a PR interval at 258 ms.  QTc interval 473 ms.  Nonspecific ST-T changes.  September 2017 ECG (independently read by me): Atrially paced rhythm with a PR interval at 248.  Complete right bundle branch block.  QTc interval 458 ms.  March 2017 ECG (independently read by me):  Atrially paced rhythm with prolonged  AV conduction with PR interval 232 ms.  Nonspecific ST-T changes.  September 2016 ECG (independently read by me): A paced rhythm at 63 with prolonged AV conduction with a PR 214.  QTC normal at 446 ms.  July 2015 ECG (independently read by me): Sinus rhythm with short PR 88 ms.  Nonspecific T changes.  Incomplete right bundle branch block  10/2013 ECG: Sinus rhythm at 69 beats per minute. QTc interval 445 ms.  LABS: BMP Latest Ref Rng & Units 09/05/2015 09/04/2015 06/08/2015  Glucose 65 - 99 mg/dL 275(H) 249(H) 172(H)  BUN 6 - 20 mg/dL _1 Creatinine 0.44 - 1.00 mg/dL 0.90 0.85 0.78  Sodium 135 - 145 mmol/L 136 135 139  Potassium 3.5 - 5.1 mmol/L 3.7 3.9 3.6  Chloride 101 - 111 mmol/L 100(L) 101 106  CO2 22 - 32 mmol/L _2 Calcium 8.9 - 10.3 mg/dL 9.4 9.3 8.9   Hepatic Function Latest Ref Rng & Units 09/08/2016 09/05/2015 06/08/2015  Total Protein 6.1 - 8.1 g/dL 6.9 6.8 6.1(L)  Albumin 3.6 - 5.1 g/dL 4.0 3.7 3.2(L)  AST 10 - 35 U/L 44(H) 33 34  ALT 6 - 29 U/L _3 Alk Phosphatase 33 - 130 U/L 48 47 41  Total Bilirubin 0.2 - 1.2 mg/dL 0.3 0.5 0.7  Bilirubin, Direct <=0.2 mg/dL 0.1 0.2 -   CBC Latest Ref Rng & Units 09/04/2015 06/07/2015 09/08/2014  WBC 4.0 - 10.5 K/uL 10.2 8.4 9.9  Hemoglobin 12.0 - 15.0 g/dL 12.7 12.3 12.3  Hematocrit 36.0 - 46.0 % 39.3 37.2 37.5  Platelets 150 - 400 K/uL 306 283 313   Lab Results  Component Value Date   MCV 91.4 09/04/2015   MCV 90.1 06/07/2015   MCV 89.9 09/08/2014   Lab Results  Component Value Date  TSH 2.088 09/05/2015   Lab Results  Component Value Date   HGBA1C 8.9 (H) 09/04/2015   Lipid Panel     Component Value Date/Time   CHOL 218 (H) 09/08/2016 0910   TRIG 212 (H) 09/08/2016 0910   HDL 46 (L) 09/08/2016 0910   CHOLHDL 4.7 09/08/2016 0910   VLDL 42 (H) 09/08/2016 0910   LDLCALC 130 (H) 09/08/2016 0910   Laboratory from Dr. Hughie Closs office from   February 22,017. Lipid studies revealed a total  cholesterol 186, LDL cholesterol 80, triglycerides 333 and VLDL cholesterol 67. Her glucose was 283.   Laboratory from Dr. Hughie Closs office from 11/27/2014: Fasting glucose 220, BUN 16, creatinine 0.66.  CO2 30.5.  Hemoglobin 12.1, hematocrit 37.3.  Hemoglobin A1c 9.4.  Total cholesterol 204, triglycerides 261, HDL 44, LDL 108, VLDL 52.   RADIOLOGY: No results found.  IMPRESSION:  1. Paroxysmal atrial fibrillation (H[facility])   2. Chronic anticoagulation   3. Hypertensive heart disease with heart failure ([place])   4. OSA (obstructive sleep apnea)   5. Morbid obesity ([place])   6. Type 2 diabetes mellitus without complication, with long-term current use of insulin ([place])   7. Mixed hyperlipidemia   8. Aortic valve stenosis, etiology of cardiac valve disease unspecified   9. Mitral valve stenosis, unspecified etiology     ASSESSMENT AND PLAN:    [name] is a 71 year old female who has a history of type 2 diabetes mellitus and is status post CABG surgery x2 by Dr. Roxy Manns in 2005. She has  a history of atrial fibrillation and underwent a Maze procedure at the time of her surgery.  She required permanent pacemaker insertion which was done by Dr. [name].    Her diabetes is not well controlled, as manifested by her hemoglobin A1c.  She has an atherogenic dyslipidemic lipid panel and is on fenofibrate 160 mg daily and atorvastatin and has xanthelasmas on physical examination.  She had recently undergone repeat laboratory which showed a cholesterol of 218, triglycerides 212, HDL 46, VLDL 42, and LDL 130.  If she is able to tolerate, I will further titrate atorvastatin to 40 mg. She is maintaining sinus rhythm and is atrially paced on ECG on amiodarone 200 mg daily, metoprolol succinate 100 mg daily, and losartan , which was titrated ultimately up to 50 mg twice a day  at her last office visit in addition to furosemide, both for her blood pressure and rhythm.  She is on eliquis anticoagulation without  bleeding. Her cha2ds2Vasc score is 5 by virtue of her female sex, age, CAD, diabetes mellitus and hypertensive history.Her peripheral edema has improved off diltiazem. She is diabetic on metformin.  She is morbidly obese and weight loss was highly recommended.  I again reviewed her sleep study in detail, which confirmed moderate sleep apnea overall, but severe sleep apnea with REM sleep.  She underwent her CPAP titration and has not yet been contacted by the DME company for CPAP set up.  I will see her in sleep clinic following initiation of CPAP therapy within 90 days per Medicare requirements.   Time spent: 25 minutes   Troy Sine, MD, [facility] 09/14/2016 7:27 PM

## 2016-09-18 NOTE — Progress Notes (Signed)
Patient referral was sent to [facility] by Lynnda Child while I was out for vacation.

## 2016-09-22 ENCOUNTER — Telehealth: Payer: Self-pay | Admitting: Cardiology

## 2016-09-22 ENCOUNTER — Ambulatory Visit (INDEPENDENT_AMBULATORY_CARE_PROVIDER_SITE_OTHER): Payer: Medicare Other | Admitting: *Deleted

## 2016-09-22 DIAGNOSIS — I495 Sick sinus syndrome: Secondary | ICD-10-CM | POA: Diagnosis not present

## 2016-09-22 NOTE — Telephone Encounter (Signed)
LMOVM reminding pt to send remote transmission.   

## 2016-09-22 NOTE — Progress Notes (Signed)
Remote pacemaker transmission.   

## 2016-09-23 LAB — CUP PACEART REMOTE DEVICE CHECK
Battery Remaining Longevity: 91 mo
Brady Statistic AS VP Percent: 0 %
Brady Statistic RA Percent Paced: 97.92 %
Date Time Interrogation Session: 20180709163544
Implantable Lead Implant Date: 20160321
Implantable Lead Location: 753860
Implantable Lead Model: 5076
Implantable Lead Model: 5076
Lead Channel Impedance Value: 361 Ohm
Lead Channel Pacing Threshold Amplitude: 0.75 V
Lead Channel Pacing Threshold Pulse Width: 0.4 ms
Lead Channel Pacing Threshold Pulse Width: 0.4 ms
Lead Channel Sensing Intrinsic Amplitude: 1.25 mV
Lead Channel Sensing Intrinsic Amplitude: 1.25 mV
MDC IDC LEAD IMPLANT DT: 20160321
MDC IDC LEAD LOCATION: 753859
MDC IDC MSMT BATTERY VOLTAGE: 3.01 V
MDC IDC MSMT LEADCHNL RA IMPEDANCE VALUE: 475 Ohm
MDC IDC MSMT LEADCHNL RV IMPEDANCE VALUE: 570 Ohm
MDC IDC MSMT LEADCHNL RV IMPEDANCE VALUE: 608 Ohm
MDC IDC MSMT LEADCHNL RV PACING THRESHOLD AMPLITUDE: 0.625 V
MDC IDC MSMT LEADCHNL RV SENSING INTR AMPL: 17.875 mV
MDC IDC MSMT LEADCHNL RV SENSING INTR AMPL: 17.875 mV
MDC IDC PG IMPLANT DT: 20160321
MDC IDC SET LEADCHNL RA PACING AMPLITUDE: 2 V
MDC IDC SET LEADCHNL RV PACING AMPLITUDE: 2.5 V
MDC IDC SET LEADCHNL RV PACING PULSEWIDTH: 0.4 ms
MDC IDC SET LEADCHNL RV SENSING SENSITIVITY: 0.9 mV
MDC IDC STAT BRADY AP VP PERCENT: 0.16 %
MDC IDC STAT BRADY AP VS PERCENT: 97.83 %
MDC IDC STAT BRADY AS VS PERCENT: 2.01 %
MDC IDC STAT BRADY RV PERCENT PACED: 0.16 %

## 2016-09-26 ENCOUNTER — Encounter: Payer: Self-pay | Admitting: Cardiology

## 2016-12-03 ENCOUNTER — Encounter: Payer: Self-pay | Admitting: Pharmacist

## 2016-12-11 ENCOUNTER — Encounter: Payer: Self-pay | Admitting: Cardiovascular Disease

## 2016-12-11 ENCOUNTER — Ambulatory Visit (INDEPENDENT_AMBULATORY_CARE_PROVIDER_SITE_OTHER): Payer: Medicare Other | Admitting: Cardiovascular Disease

## 2016-12-11 VITALS — BP 149/79 | HR 82 | Ht 64.0 in | Wt 245.2 lb

## 2016-12-11 DIAGNOSIS — G4733 Obstructive sleep apnea (adult) (pediatric): Secondary | ICD-10-CM

## 2016-12-11 DIAGNOSIS — I480 Paroxysmal atrial fibrillation: Secondary | ICD-10-CM | POA: Diagnosis not present

## 2016-12-11 DIAGNOSIS — Z794 Long term (current) use of insulin: Secondary | ICD-10-CM

## 2016-12-11 DIAGNOSIS — E782 Mixed hyperlipidemia: Secondary | ICD-10-CM | POA: Diagnosis not present

## 2016-12-11 DIAGNOSIS — Z7901 Long term (current) use of anticoagulants: Secondary | ICD-10-CM

## 2016-12-11 DIAGNOSIS — E119 Type 2 diabetes mellitus without complications: Secondary | ICD-10-CM | POA: Diagnosis not present

## 2016-12-11 NOTE — Patient Instructions (Signed)
Your physician recommends that you continue on your current medications as directed. Please refer to the Current Medication list given to you today.   Your physician wants you to follow-up in: 6 MONTHS with Dr. Claiborne Billings. You will receive a reminder letter in the mail two months in advance. If you don't receive a letter, please call our office to schedule the follow-up appointment.

## 2016-12-11 NOTE — Progress Notes (Signed)
Patient ID: [name], female   DOB: 05-10-1945, 71 y.o.   MRN: [id number]   Primary M.D.: Dr. [name]  HPI: [name] is a 71 year-old female who presents for a 3 month follow-up cardiology and sleep evaluation.    [name] admits to a history of CAD, diabetes mellitus, peripheral neuropathy, and hypertension.   She underwent CABG revascularization surgery x2 with a maze procedure for atrial fibrillation at that time by Dr. Roxy Manns.  She experienced 2 episodes of palpitations where she felt her heart was temporarily out of rhythm. She denied associated chest tightness. She does note some shortness of breath. . A 2-D echo Doppler study on 10/05/2012 showed normal systolic function with an ejection fraction of 55-60% with mild tissue Doppler abnormality. Her aortic valve was trileaflet and sclerotic with very mild stenosis with a mean gradient of 10 and a peak gradient of 20 mm respectively. There is trivial AR. She also had posterior calcified mitral annulus with mild MR.  A nuclear perfusion study in 2014 revealed mild breast attenuation but otherwise normal perfusion.   She underwent permanent pacemaker insertion by Dr. Crissie Sickles.  She also has had issues with kidney stones and underwent 2 urological procedures in June and July 2016 on her left kidney by Dr. Nicolette Bang.  She denies recent chest pressure.  She had recent blood work drawn by Dr. [name].  These are noted below in the laboratory section.  She notes some mild shortness of breath and mild leg swelling.  She denies any awareness of palpitations.  She had gone into atrial fibrillation in March and again in June 2017 which occurred at night after drinking cold juice.  She does snore.  She was hospitalized overnight last on June 20 she presented with AF with a rapid ventricular rate at 129.  She was started on IV diltiazem with ultimate improvement and eventually her oral dose of Cardizem was increased.   Troponins were negative.  An echo during that hospitalization showed an EF of 60-65%.  There was normal wall motion.  There was mild aortic valve stenosis and she had a mean gradient of 10 and peak gradient of 20 with a valve area of 1.35.  She was seen in follow-up by [name] and was remaining stable.  She saw Dr. [name] on 12/04/2015 and had normal pacemaker function and was maintaining normal sinus rhythm over 99% of the time on her current dose of amiodarone 200 mg.    With her cardiovascular comorbidities and recurrent AF  I recommended that she undergo a sleep study to evaluate for sleep apnea.  This was done on 01/22/2016 and revealed moderate sleep apnea overall within AHI 15.4.  However, sleep apnea was severe during rems sleep at 36 per hour.  Her oxygen nadir was 83%.  She was scheduled to undergo a CPAP titration, but she canceled this appointment in January due to some other conflict.  When I last saw her, she had not yet had her CPAP titration.  I strongly recommended that she have this done.  He Pap titration was done on 07/30/2016 and she was titrated up to 15 cm water pressure.  She had a significant PLMS index of 85.6.  She has been on gabapentin for neuropathy, which will also treat restless legs.  She underwent an echo Doppler study on 07/03/2016 which showed an EF of 60-65%.  She had grade 2 diastolic dysfunction.  She had an upper normal to mildly  increased.  The ascending aorta and a thickened mitral valve suggesting moderate stenosis.  There was a mild gradient across her aortic valve.  She had developed mild edema on diltiazem, which improved once this was discontinued.  She denies chest pain.  She continues to take eliquis for anticoagulation and continues to be on amiodarone 200 mg for her PAF.   Since I last saw her, she has a new CPAP ResMed AirSense 10 AutoSet unit.  Percent update was 09/30/2016.  She has a ResMed airFit 20 medium tsize mask.  A download was obtained from  August 26 through 12/08/2016.  She is meeting compliance standards and had 93%.  Days of usage and 87% of days with usage greater than 4 hours.  However, she was only averaging 5 hours of sleep per night.  She is set at a 15 cm water pressure.  Her AHI is excellent at 0.1.  Used her machine were recently due to the fact that she has a broken tooth.  This had resulted in significant teeth pain during the night with her unit on going into this region.  She was recently started on antibiotics by a dentist and will need tooth extraction.  She is sleeping well with her CPAP.  She denies breakthrough snoring.  She denies daytime sleepiness.  A new Epworth Sleepiness Scale score was calculated in the office today and this endorsed at 5 with only a moderate chance of dozing while watching television or high chance of dozing when lying down to rest in the afternoon when circumstances permit.  She presents for evaluation.  Past Medical History:  Diagnosis Date  . Asthmatic bronchitis   . CHF (congestive heart failure) ([place]) dx'd 2016  . Coronary atherosclerosis of native coronary artery    a. s/p CABG 2005 Dr Roxy Manns    . Hyperlipidemia   . Hypertension   . Hypertensive heart disease   . Kidney stones   . Mixed hyperlipidemia   . Paroxysmal atrial fibrillation ([place])    a. s/p Maze in 2005 b. recurrence in March 2016 with >8sec posttermination pauses s/p PPM implant    . Pneumonia 1950   "double"  . Presence of permanent cardiac pacemaker   . Sleep apnea    "tested; mask ordered; couldn't afford it; will get it now" (09/04/2015)  . TIA (transient ischemic attack)   . Type II diabetes mellitus ([place])   . Uterine cancer [facility])    age 53 with partial hysterectomy    Past Surgical History:  Procedure Laterality Date  . CARDIAC CATHETERIZATION  2005  . CORONARY ARTERY BYPASS GRAFT  2005   Dr Roxy Manns with MAZE  . CYSTOSCOPY WITH URETEROSCOPY, STONE BASKETRY AND STENT PLACEMENT Left 09/08/2014   Procedure:  CYSTOSCOPY WITH URETEROSCOPY, STONE BASKETRY AND STENT PLACEMENT;  Surgeon: Cleon Gustin, MD;  [facility];  Service: Urology;  Laterality: Left;  . CYSTOSCOPY/URETEROSCOPY/HOLMIUM LASER/STENT PLACEMENT Left 09/15/2014   Procedure: CYSTOSCOPY/RETROGRADE/URETEROSCOPY/HOLMIUM LASER/ STONE EXTRACTION /STENT EXCHANGE;  Surgeon: Cleon Gustin, MD;  [facility];  Service: Urology;  Laterality: Left;  . HOLMIUM LASER APPLICATION Left 06/16/270   Procedure: HOLMIUM LASER APPLICATION;  Surgeon: Cleon Gustin, MD;  [facility];  Service: Urology;  Laterality: Left;  . HOLMIUM LASER APPLICATION Left 07/18/6642   Procedure: HOLMIUM LASER APPLICATION;  Surgeon: Cleon Gustin, MD;  [facility];  Service: Urology;  Laterality: Left;  . INSERT / REPLACE / REMOVE PACEMAKER    . LAPAROSCOPIC CHOLECYSTECTOMY    .  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 VAGINAL HYSTERECTOMY  1975    No Known Allergies  Current Outpatient Prescriptions  Medication Sig Dispense Refill  . acetaminophen (TYLENOL) 500 MG tablet Take 500-1,000 mg by mouth every 6 (six) hours as needed for moderate pain.    [name] amiodarone (PACERONE) 200 MG tablet Take 1 tablet (200 mg total) by mouth daily. 30 tablet 9  . apixaban (ELIQUIS) 5 MG TABS tablet Take 1 tablet (5 mg total) by mouth 2 (two) times daily. Take 1st dose 06-07-14 60 tablet 1  . aspirin 81 MG tablet Take 81 mg by mouth daily.     [name] atorvastatin (LIPITOR) 10 MG tablet Take 1 tablet (10 mg total) by mouth daily. 90 tablet 3  . B Complex-Biotin-FA (B-COMPLEX PO) Take 1 capsule by mouth every morning.     [name] BIOTIN PO Take 1 tablet by mouth daily.    . Cholecalciferol (VITAMIN D3) 1000 UNITS CAPS Take 1,000 Units by mouth daily.    . Coenzyme Q10 (CO Q-10) 200 MG CAPS Take 200 mg by mouth daily.    . diphenhydramine-acetaminophen (TYLENOL PM) 25-500 MG TABS  tablet Take 1 tablet by mouth at bedtime as needed (sleep).    . fenofibrate 160 MG tablet Take 1 tablet (160 mg total) by mouth daily. 90 tablet 3  . furosemide (LASIX) 40 MG tablet Take 40 mg by mouth daily. Can take 1 extra tablet by mouth daily as needed for swelling/edema    . gabapentin (NEURONTIN) 300 MG capsule Take 300 mg by mouth at bedtime.     . GuaiFENesin (TUSSIN PO) Take by mouth 2 (two) times daily as needed (Cough/congestion). Pt unsure of strength    . insulin aspart protamine- aspart (NOVOLOG MIX 70/30) (70-30) 100 UNIT/ML injection Inject 35 Units into the skin 2 (two) times daily with a meal.     . losartan (COZAAR) 50 MG tablet Take 75 mg (1/2 tab) daily for one week. Then take 50 mg twice daily. 180 tablet 3  . metFORMIN (GLUCOPHAGE) 500 MG tablet Take 500 mg by mouth 2 (two) times daily with a meal.     . metoprolol succinate (TOPROL XL) 100 MG 24 hr tablet Take 1 tablet (100 mg total) by mouth daily. Take with or immediately following a meal. 90 tablet 3  . NEOMYCIN-POLYMYXIN-HYDROCORTISONE (CORTISPORIN) 1 % SOLN otic solution Place 4 drops into both ears 2 (two) times daily as needed. For pain and itching    . Omega-3 Fatty Acids (FISH OIL PO) Take 2 capsules by mouth 2 (two) times daily.    . potassium chloride (K-DUR,KLOR-CON) 10 MEQ tablet Take 10 mEq by mouth every morning.      No current facility-administered medications for this visit.     Socially she is married to [employer]; she has 2 children one grandchild. She is retired. She completed ninth grade education. Has no history of tobacco use. She is not drink alcohol. She does walk approximately 2-3 times per week.  Family History  Problem Relation Age of Onset  . Hypertension Mother   . Hyperlipidemia Mother   . Heart disease Mother   . Diabetes Father   . Stroke Father   . Heart disease Father   . Hypertension Sister        3 sisters  . Arthritis Brother   . Heart attack Maternal Grandfather   .  Stroke Paternal Grandfather   . Hypertension Daughter    ROS General: Negative; No fevers, chills, or night sweats;  HEENT: Negative; No changes in vision or hearing, sinus congestion, difficulty swallowing Pulmonary: Negative; No cough, wheezing, shortness of breath, hemoptysis Cardiovascular: He history of present illness GI: Negative; No nausea, vomiting, diarrhea, or abdominal pain GU: Positive for recent left uteroscopic stone manipulation with laser lithotripsy for left kidney stone Musculoskeletal: Negative; no myalgias, joint pain, or weakness Hematologic/Oncology: Negative; no easy bruising, bleeding Endocrine: Positive for diabetes mellitus Neuro: Negative; no changes in balance, headaches Skin: Negative; No rashes or skin lesions Psychiatric: Negative; No behavioral problems, depression Sleep: Positive for OSA, Recently started on CPAP therapy;  no daytime sleepiness, hypersomnolence, bruxism, restless legs, hypnogognic hallucinations, no cataplexy Other comprehensive 14 point system review is negative.   PE BP (!) 149/79   Pulse 82   Ht _0  (1.626 m)   Wt 245 lb 3.2 oz (111.2 kg)   BMI 42.09 kg/m    Repeat blood pressure 120/70  Wt Readings from Last 3 Encounters:  12/11/16 245 lb 3.2 oz (111.2 kg)  09/12/16 247 lb (112 kg)  07/30/16 253 lb (114.8 kg)     Physical Exam BP (!) 149/79   Pulse 82   Ht _1  (1.626 m)   Wt 245 lb 3.2 oz (111.2 kg)   BMI 42.09 kg/m  General: Alert, oriented, no distress.  Skin: normal turgor, no rashes, warm and dry HEENT: Normocephalic, atraumatic. Pupils equal round and reactive to light; sclera anicteric; extraocular muscles intact;  Nose without nasal septal hypertrophy Mouth/Parynx benign; Mallinpatti scale 3 Neck: No JVD, no carotid bruits; normal carotid upstroke Lungs: clear to ausculatation and percussion; no wheezing or rales Chest wall: without tenderness to palpitation Heart: PMI not displaced, RRR, s1 s2  normal, 1/6 systolic murmur, no diastolic murmur, no rubs, gallops, thrills, or heaves Abdomen: Central adiposity soft, nontender; no hepatosplenomehaly, BS+; abdominal aorta nontender and not dilated by palpation. Back: no CVA tenderness Pulses 2+ Musculoskeletal: full range of motion, normal strength, no joint deformities Extremities: no clubbing cyanosis or edema, Homan's sign negative  Neurologic: grossly nonfocal; Cranial nerves grossly wnl Psychologic: Normal mood and affect   ECG (independently read by me): Atrially paced rhythm at 83 bpm with prolonged AV conduction with a PR interval 284 ms.  QTc interval 484 ms.  Nonspecific ST changes.  March 2018 ECG (independently read by me): Atrially paced rhythm at 81 bpm.  Prolonged AV conduction with a PR interval at 258 ms.  QTc interval 473 ms.  Nonspecific ST-T changes.  September 2017 ECG (independently read by me): Atrially paced rhythm with a PR interval at 248.  Complete right bundle branch block.  QTc interval 458 ms.  March 2017 ECG (independently read by me):  Atrially paced rhythm with prolonged AV conduction with PR interval 232 ms.  Nonspecific ST-T changes.  September 2016 ECG (independently read by me): A paced rhythm at 63 with prolonged AV conduction with a PR 214.  QTC normal at 446 ms.  July 2015 ECG (independently read by me): Sinus rhythm with short PR 88 ms.  Nonspecific T changes.  Incomplete right bundle branch block  10/2013 ECG: Sinus rhythm at 69 beats per minute. QTc interval 445 ms.  LABS: BMP Latest Ref Rng & Units 09/05/2015 09/04/2015 06/08/2015  Glucose 65 - 99 mg/dL 275(H) 249(H) 172(H)  BUN 6 - 20 mg/dL _2 Creatinine 0.44 - 1.00 mg/dL  0.90 0.85 0.78  Sodium 135 - 145 mmol/L 136 135 139  Potassium 3.5 - 5.1 mmol/L 3.7 3.9 3.6  Chloride 101 - 111 mmol/L 100(L) 101 106  CO2 22 - 32 mmol/L _0 Calcium 8.9 - 10.3 mg/dL 9.4 9.3 8.9   Hepatic Function Latest Ref Rng & Units 09/08/2016 09/05/2015  06/08/2015  Total Protein 6.1 - 8.1 g/dL 6.9 6.8 6.1(L)  Albumin 3.6 - 5.1 g/dL 4.0 3.7 3.2(L)  AST 10 - 35 U/L 44(H) 33 34  ALT 6 - 29 U/L _1 Alk Phosphatase 33 - 130 U/L 48 47 41  Total Bilirubin 0.2 - 1.2 mg/dL 0.3 0.5 0.7  Bilirubin, Direct <=0.2 mg/dL 0.1 0.2 -   CBC Latest Ref Rng & Units 09/04/2015 06/07/2015 09/08/2014  WBC 4.0 - 10.5 K/uL 10.2 8.4 9.9  Hemoglobin 12.0 - 15.0 g/dL 12.7 12.3 12.3  Hematocrit 36.0 - 46.0 % 39.3 37.2 37.5  Platelets 150 - 400 K/uL 306 283 313   Lab Results  Component Value Date   MCV 91.4 09/04/2015   MCV 90.1 06/07/2015   MCV 89.9 09/08/2014   Lab Results  Component Value Date   TSH 2.088 09/05/2015   Lab Results  Component Value Date   HGBA1C 8.9 (H) 09/04/2015   Lipid Panel     Component Value Date/Time   CHOL 218 (H) 09/08/2016 0910   TRIG 212 (H) 09/08/2016 0910   HDL 46 (L) 09/08/2016 0910   CHOLHDL 4.7 09/08/2016 0910   VLDL 42 (H) 09/08/2016 0910   LDLCALC 130 (H) 09/08/2016 0910   Laboratory from Dr. Hughie Closs office from   February 22,017. Lipid studies revealed a total cholesterol 186, LDL cholesterol 80, triglycerides 333 and VLDL cholesterol 67. Her glucose was 283.   Laboratory from Dr. Hughie Closs office from 11/27/2014: Fasting glucose 220, BUN 16, creatinine 0.66.  CO2 30.5.  Hemoglobin 12.1, hematocrit 37.3.  Hemoglobin A1c 9.4.  Total cholesterol 204, triglycerides 261, HDL 44, LDL 108, VLDL 52.   RADIOLOGY: No results found.  IMPRESSION:  1. OSA (obstructive sleep apnea)   2. Paroxysmal atrial fibrillation (HCC)   3. Chronic anticoagulation   4. Mixed hyperlipidemia   5. Type 2 diabetes mellitus without complication, with long-term current use of insulin ([place])   6. Morbid obesity ([place])     ASSESSMENT AND PLAN:    [name] is a 71 year old female who has a history of type 2 diabetes mellitus and is status post CABG surgery x2 by Dr. Roxy Manns in 2005. She has  a history of atrial  fibrillation and underwent a Maze procedure at the time of her surgery.  She is status post permanent pacemaker insertion by Dr. [name].    Her diabetes is not well controlled, as manifested by her hemoglobin A1c.  She has an atherogenic dyslipidemic lipid panel and is on fenofibrate 160 mg daily and atorvastatin and has xanthelasmas on physical examination.  She had recently undergone repeat laboratory which showed a cholesterol of 218, triglycerides 212, HDL 46, VLDL 42, and LDL 130.  I had recommended further titration of her lipid-lowering therapy as tolerated.   She is maintaining sinus rhythm and is atrially paced on ECG on amiodarone 200 mg daily, metoprolol succinate 100 mg daily, and losartan which had been increased to 50 mg twice a day..  She is on eliquis anticoagulation without bleeding. Her cha2ds2Vasc score is 5 by virtue of her female sex, age, CAD, diabetes mellitus  and hypertensive history.I  reviewed her sleep study in detail, which confirmed moderate sleep apnea overall, but severe sleep apnea with REM sleep.  Since initiating CPAP therapy, she has a broken tooth, which will need to be extracted.  The pressure has irritated this area while sleeping.  I have recommended hopefully that her tooth can be removed as soon as possible so that she can resume her CPAP with 100% compliance and with greater sleep duration.  Her BMI is consistent with morbid obesity.  Weight loss and exercise was strongly recommended.  I will see her in 6 months for reevaluation.  Time spent: 25 minutes   Troy Sine, MD, [facility] 12/13/2016 10:01 AM

## 2016-12-17 ENCOUNTER — Encounter: Payer: Self-pay | Admitting: Internal Medicine

## 2016-12-17 ENCOUNTER — Ambulatory Visit (INDEPENDENT_AMBULATORY_CARE_PROVIDER_SITE_OTHER): Payer: Medicare Other | Admitting: Internal Medicine

## 2016-12-17 VITALS — BP 160/84 | HR 94 | Ht 64.0 in | Wt 246.0 lb

## 2016-12-17 DIAGNOSIS — I480 Paroxysmal atrial fibrillation: Secondary | ICD-10-CM | POA: Diagnosis not present

## 2016-12-17 LAB — CUP PACEART INCLINIC DEVICE CHECK
Battery Voltage: 3.01 V
Brady Statistic AP VP Percent: 0.27 %
Brady Statistic AP VS Percent: 94.69 %
Brady Statistic AS VP Percent: 0 %
Brady Statistic AS VS Percent: 5.04 %
Brady Statistic RV Percent Paced: 0.28 %
Implantable Lead Implant Date: 20160321
Implantable Lead Location: 753859
Implantable Lead Model: 5076
Implantable Pulse Generator Implant Date: 20160321
Lead Channel Impedance Value: 361 Ohm
Lead Channel Impedance Value: 456 Ohm
Lead Channel Impedance Value: 532 Ohm
Lead Channel Impedance Value: 570 Ohm
Lead Channel Pacing Threshold Amplitude: 0.5 V
Lead Channel Pacing Threshold Amplitude: 0.75 V
Lead Channel Sensing Intrinsic Amplitude: 18 mV
Lead Channel Sensing Intrinsic Amplitude: 21.25 mV
Lead Channel Setting Pacing Amplitude: 2 V
Lead Channel Setting Pacing Amplitude: 2.5 V
Lead Channel Setting Pacing Pulse Width: 0.4 ms
Lead Channel Setting Sensing Sensitivity: 0.9 mV
MDC IDC LEAD IMPLANT DT: 20160321
MDC IDC LEAD LOCATION: 753860
MDC IDC MSMT BATTERY REMAINING LONGEVITY: 90 mo
MDC IDC MSMT LEADCHNL RA PACING THRESHOLD PULSEWIDTH: 0.4 ms
MDC IDC MSMT LEADCHNL RA SENSING INTR AMPL: 1.375 mV
MDC IDC MSMT LEADCHNL RA SENSING INTR AMPL: 1.625 mV
MDC IDC MSMT LEADCHNL RV PACING THRESHOLD PULSEWIDTH: 0.4 ms
MDC IDC SESS DTM: 20181003164218
MDC IDC STAT BRADY RA PERCENT PACED: 94.87 %

## 2016-12-17 MED ORDER — AMIODARONE HCL 200 MG PO TABS: ORAL_TABLET | ORAL | 3 refills | Status: DC

## 2016-12-17 NOTE — Progress Notes (Signed)
HPI [name] returns today for ongoing evaluation and management of her atrial fibrillation and post termination pauses, with syncope, s/p PPM insertion. Today she denies chest pain or sob.  She denies palpitations.  No Known Allergies   Current Outpatient Prescriptions  Medication Sig Dispense Refill  . acetaminophen (TYLENOL) 500 MG tablet Take 500-1,000 mg by mouth every 6 (six) hours as needed for moderate pain.    [name] amiodarone (PACERONE) 200 MG tablet Take 1 tablet (200 mg total) by mouth daily. 30 tablet 9  . apixaban (ELIQUIS) 5 MG TABS tablet Take 1 tablet (5 mg total) by mouth 2 (two) times daily. Take 1st dose 06-07-14 60 tablet 1  . aspirin 81 MG tablet Take 81 mg by mouth daily.     [name] atorvastatin (LIPITOR) 10 MG tablet Take 1 tablet (10 mg total) by mouth daily. 90 tablet 3  . B Complex-Biotin-FA (B-COMPLEX PO) Take 1 capsule by mouth every morning.     [name] BIOTIN PO Take 1 tablet by mouth daily.    . Cholecalciferol (VITAMIN D3) 1000 UNITS CAPS Take 1,000 Units by mouth daily.    . Coenzyme Q10 (CO Q-10) 200 MG CAPS Take 200 mg by mouth daily.    . diphenhydramine-acetaminophen (TYLENOL PM) 25-500 MG TABS tablet Take 1 tablet by mouth at bedtime as needed (sleep).    . fenofibrate 160 MG tablet Take 1 tablet (160 mg total) by mouth daily. 90 tablet 3  . furosemide (LASIX) 40 MG tablet Take 40 mg by mouth daily. Can take 1 extra tablet by mouth daily as needed for swelling/edema    . gabapentin (NEURONTIN) 300 MG capsule Take 300 mg by mouth at bedtime.     . GuaiFENesin (TUSSIN PO) Take by mouth 2 (two) times daily as needed (Cough/congestion). Pt unsure of strength    . insulin aspart protamine- aspart (NOVOLOG MIX 70/30) (70-30) 100 UNIT/ML injection Inject 35 Units into the skin 2 (two) times daily with a meal.     . losartan (COZAAR) 50 MG tablet Take 75 mg (1/2 tab) daily for one week. Then take 50 mg twice daily. 180 tablet 3  . metFORMIN (GLUCOPHAGE) 500 MG  tablet Take 500 mg by mouth 2 (two) times daily with a meal.     . metoprolol succinate (TOPROL XL) 100 MG 24 hr tablet Take 1 tablet (100 mg total) by mouth daily. Take with or immediately following a meal. 90 tablet 3  . NEOMYCIN-POLYMYXIN-HYDROCORTISONE (CORTISPORIN) 1 % SOLN otic solution Place 4 drops into both ears 2 (two) times daily as needed. For pain and itching    . Omega-3 Fatty Acids (FISH OIL PO) Take 2 capsules by mouth 2 (two) times daily.    . potassium chloride (K-DUR,KLOR-CON) 10 MEQ tablet Take 10 mEq by mouth every morning.      No current facility-administered medications for this visit.      Past Medical History:  Diagnosis Date  . Asthmatic bronchitis   . CHF (congestive heart failure) ([place]) dx'd 2016  . Coronary atherosclerosis of native coronary artery    a. s/p CABG 2005 Dr Roxy Manns    . Hyperlipidemia   . Hypertension   . Hypertensive heart disease   . Kidney stones   . Mixed hyperlipidemia   . Paroxysmal atrial fibrillation ([place])    a. s/p Maze in 2005 b. recurrence in March 2016 with >8sec posttermination pauses s/p PPM implant    . Pneumonia 1950   "  double"  . Presence of permanent cardiac pacemaker   . Sleep apnea    "tested; mask ordered; couldn't afford it; will get it now" (09/04/2015)  . TIA (transient ischemic attack)   . Type II diabetes mellitus ([place])   . Uterine cancer [facility])    age 71 with partial hysterectomy    ROS:   All systems reviewed and negative except as noted in the HPI.   Past Surgical History:  Procedure Laterality Date  . CARDIAC CATHETERIZATION  2005  . CORONARY ARTERY BYPASS GRAFT  2005   Dr Roxy Manns with MAZE  . CYSTOSCOPY WITH URETEROSCOPY, STONE BASKETRY AND STENT PLACEMENT Left 09/08/2014   Procedure: CYSTOSCOPY WITH URETEROSCOPY, STONE BASKETRY AND STENT PLACEMENT;  Surgeon: Cleon Gustin, MD;  [facility];  Service: Urology;  Laterality: Left;  . CYSTOSCOPY/URETEROSCOPY/HOLMIUM LASER/STENT PLACEMENT Left  09/15/2014   Procedure: CYSTOSCOPY/RETROGRADE/URETEROSCOPY/HOLMIUM LASER/ STONE EXTRACTION /STENT EXCHANGE;  Surgeon: Cleon Gustin, MD;  [facility];  Service: Urology;  Laterality: Left;  . HOLMIUM LASER APPLICATION Left 08/01/15   Procedure: HOLMIUM LASER APPLICATION;  Surgeon: Cleon Gustin, MD;  [facility];  Service: Urology;  Laterality: Left;  . HOLMIUM LASER APPLICATION Left 06/24/4494   Procedure: HOLMIUM LASER APPLICATION;  Surgeon: Cleon Gustin, MD;  [facility];  Service: Urology;  Laterality: Left;  . INSERT / REPLACE / REMOVE PACEMAKER    . LAPAROSCOPIC CHOLECYSTECTOMY    . 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 VAGINAL HYSTERECTOMY  1975     Family History  Problem Relation Age of Onset  . Hypertension Mother   . Hyperlipidemia Mother   . Heart disease Mother   . Diabetes Father   . Stroke Father   . Heart disease Father   . Hypertension Sister        3 sisters  . Arthritis Brother   . Heart attack Maternal Grandfather   . Stroke Paternal Grandfather   . Hypertension Daughter      Social History   Social History  . Marital status: Married    Spouse name: N/A  . Number of children: N/A  . Years of education: N/A   Occupational History  . Not on file.   Social History Main Topics  . Smoking status: Never Smoker  . Smokeless tobacco: Never Used  . Alcohol use No  . Drug use: No  . Sexual activity: Not on file   Other Topics Concern  . Not on file   Social History Narrative  . No narrative on file     BP (!) 160/84   Pulse 94   Ht _0  (1.626 m)   Wt 246 lb (111.6 kg)   SpO2 98%   BMI 42.23 kg/m   Physical Exam:  Well appearing NAD HEENT: Unremarkable Neck:  No JVD, no thyromegally Lymphatics:  No adenopathy Back:  No CVA tenderness Lungs:  Clear HEART:  Regular rate rhythm, no murmurs, no rubs, no clicks Abd:   soft, positive bowel sounds, no organomegally, no rebound, no guarding Ext:  2 plus pulses, no edema, no cyanosis, no clubbing Skin:  No rashes no nodules Neuro:  CN II through XII intact, motor grossly intact  DEVICE  Normal device function.  See PaceArt for details.   Assess/Plan: 1. Sinus node dysfunction - she is s/p PPM and is doing well.  2. PPM - her Medtronic DDD PM  is working normally. Will follow.  3. HTN - her blood pressure is elevated however it has been well controlled. She is overweight and I have asked her to lose weight. 4. PAF - interogation of her PPM demonstrates that she is in NSR 99.9% of the time.   Mikle Bosworth.D.

## 2016-12-17 NOTE — Patient Instructions (Signed)
Medication Instructions:  Your physician has recommended you make the following change in your medication:  1.  Decrease your amiodarone.   Start taking amiodarone 200 mg (one tablet) by mouth daily Monday through Saturday.  Do not take amiodarone on Sunday.  Labwork: None ordered.  Testing/Procedures: None ordered.  Follow-Up: Your physician wants you to follow-up in: one year with Dr. [name].   You will receive a reminder letter in the mail two months in advance. If you don't receive a letter, please call our office to schedule the follow-up appointment.  Remote monitoring is used to monitor your Pacemaker from home. This monitoring reduces the number of office visits required to check your device to one time per year. It allows [place] to keep an eye on the functioning of your device to ensure it is working properly. You are scheduled for a device check from home on 12/22/2016. You may send your transmission at any time that day. If you have a wireless device, the transmission will be sent automatically. After your physician reviews your transmission, you will receive a postcard with your next transmission date.    Any Other Special Instructions Will Be Listed Below (If Applicable).     If you need a refill on your cardiac medications before your next appointment, please call your pharmacy.

## 2016-12-18 ENCOUNTER — Telehealth: Payer: Self-pay

## 2016-12-18 NOTE — Telephone Encounter (Signed)
Pt had requested samples of Eliquis (Pt had never received any), office was out during office visit.  Samples received today, VM left for Pt notifying samples available for pick up at front desk.  Left this nurse name and # for call back if any questions.

## 2016-12-19 ENCOUNTER — Encounter: Payer: Medicare Other | Admitting: Internal Medicine

## 2016-12-22 ENCOUNTER — Ambulatory Visit (INDEPENDENT_AMBULATORY_CARE_PROVIDER_SITE_OTHER): Payer: Medicare Other | Admitting: *Deleted

## 2016-12-22 ENCOUNTER — Telehealth: Payer: Self-pay | Admitting: Cardiology

## 2016-12-22 DIAGNOSIS — I495 Sick sinus syndrome: Secondary | ICD-10-CM | POA: Diagnosis not present

## 2016-12-22 NOTE — Telephone Encounter (Signed)
LMOVM reminding pt to send remote transmission.

## 2016-12-23 LAB — CUP PACEART REMOTE DEVICE CHECK
Battery Remaining Longevity: 90 mo
Battery Voltage: 3.01 V
Brady Statistic RA Percent Paced: 96.1 %
Implantable Lead Implant Date: 20160321
Implantable Lead Location: 753860
Implantable Lead Model: 5076
Implantable Pulse Generator Implant Date: 20160321
Lead Channel Impedance Value: 361 Ohm
Lead Channel Impedance Value: 551 Ohm
Lead Channel Pacing Threshold Amplitude: 0.75 V
Lead Channel Pacing Threshold Pulse Width: 0.4 ms
Lead Channel Pacing Threshold Pulse Width: 0.4 ms
Lead Channel Sensing Intrinsic Amplitude: 1.625 mV
Lead Channel Sensing Intrinsic Amplitude: 1.625 mV
Lead Channel Setting Pacing Amplitude: 2 V
MDC IDC LEAD IMPLANT DT: 20160321
MDC IDC LEAD LOCATION: 753859
MDC IDC MSMT LEADCHNL RA IMPEDANCE VALUE: 475 Ohm
MDC IDC MSMT LEADCHNL RV IMPEDANCE VALUE: 570 Ohm
MDC IDC MSMT LEADCHNL RV PACING THRESHOLD AMPLITUDE: 0.625 V
MDC IDC MSMT LEADCHNL RV SENSING INTR AMPL: 20.875 mV
MDC IDC MSMT LEADCHNL RV SENSING INTR AMPL: 20.875 mV
MDC IDC SESS DTM: 20181008205334
MDC IDC SET LEADCHNL RV PACING AMPLITUDE: 2.5 V
MDC IDC SET LEADCHNL RV PACING PULSEWIDTH: 0.4 ms
MDC IDC SET LEADCHNL RV SENSING SENSITIVITY: 0.9 mV
MDC IDC STAT BRADY AP VP PERCENT: 0.39 %
MDC IDC STAT BRADY AP VS PERCENT: 95.73 %
MDC IDC STAT BRADY AS VP PERCENT: 0 %
MDC IDC STAT BRADY AS VS PERCENT: 3.88 %
MDC IDC STAT BRADY RV PERCENT PACED: 0.4 %

## 2016-12-23 NOTE — Progress Notes (Signed)
Remote pacemaker transmission.   

## 2016-12-26 ENCOUNTER — Encounter: Payer: Self-pay | Admitting: Cardiology

## 2017-02-19 ENCOUNTER — Other Ambulatory Visit: Payer: Self-pay | Admitting: Cardiovascular Disease

## 2017-03-23 ENCOUNTER — Ambulatory Visit (INDEPENDENT_AMBULATORY_CARE_PROVIDER_SITE_OTHER): Payer: Medicare Other | Admitting: *Deleted

## 2017-03-23 ENCOUNTER — Telehealth: Payer: Self-pay | Admitting: Cardiology

## 2017-03-23 DIAGNOSIS — I495 Sick sinus syndrome: Secondary | ICD-10-CM

## 2017-03-23 NOTE — Telephone Encounter (Signed)
Spoke with pt and reminded pt of remote transmission that is due today. Pt verbalized understanding.

## 2017-03-26 ENCOUNTER — Encounter: Payer: Self-pay | Admitting: Cardiology

## 2017-03-26 NOTE — Progress Notes (Signed)
Remote pacemaker transmission.   

## 2017-03-28 LAB — CUP PACEART REMOTE DEVICE CHECK
Battery Voltage: 3.01 V
Brady Statistic AP VP Percent: 0.15 %
Brady Statistic RA Percent Paced: 99.02 %
Brady Statistic RV Percent Paced: 0.15 %
Date Time Interrogation Session: 20190108164943
Implantable Lead Location: 753859
Implantable Lead Location: 753860
Implantable Lead Model: 5076
Implantable Lead Model: 5076
Implantable Pulse Generator Implant Date: 20160321
Lead Channel Impedance Value: 456 Ohm
Lead Channel Impedance Value: 513 Ohm
Lead Channel Impedance Value: 570 Ohm
Lead Channel Pacing Threshold Amplitude: 0.625 V
Lead Channel Pacing Threshold Pulse Width: 0.4 ms
Lead Channel Setting Pacing Amplitude: 2 V
Lead Channel Setting Sensing Sensitivity: 0.9 mV
MDC IDC LEAD IMPLANT DT: 20160321
MDC IDC LEAD IMPLANT DT: 20160321
MDC IDC MSMT BATTERY REMAINING LONGEVITY: 87 mo
MDC IDC MSMT LEADCHNL RA IMPEDANCE VALUE: 361 Ohm
MDC IDC MSMT LEADCHNL RA SENSING INTR AMPL: 1.125 mV
MDC IDC MSMT LEADCHNL RA SENSING INTR AMPL: 1.125 mV
MDC IDC MSMT LEADCHNL RV PACING THRESHOLD AMPLITUDE: 0.5 V
MDC IDC MSMT LEADCHNL RV PACING THRESHOLD PULSEWIDTH: 0.4 ms
MDC IDC MSMT LEADCHNL RV SENSING INTR AMPL: 19.125 mV
MDC IDC MSMT LEADCHNL RV SENSING INTR AMPL: 19.125 mV
MDC IDC SET LEADCHNL RV PACING AMPLITUDE: 2.5 V
MDC IDC SET LEADCHNL RV PACING PULSEWIDTH: 0.4 ms
MDC IDC STAT BRADY AP VS PERCENT: 98.9 %
MDC IDC STAT BRADY AS VP PERCENT: 0 %
MDC IDC STAT BRADY AS VS PERCENT: 0.95 %

## 2017-04-02 ENCOUNTER — Other Ambulatory Visit: Payer: Self-pay | Admitting: Cardiovascular Disease

## 2017-04-05 ENCOUNTER — Emergency Department (HOSPITAL_COMMUNITY)
Admission: EM | Admit: 2017-04-05 | Discharge: 2017-04-05 | Discharge status: Against Medical Advice | Payer: Medicare Other | Attending: Emergency Medicine | Admitting: Emergency Medicine

## 2017-04-05 ENCOUNTER — Encounter (HOSPITAL_COMMUNITY): Payer: Self-pay | Admitting: Emergency Medicine

## 2017-04-05 DIAGNOSIS — Z5321 Procedure and treatment not carried out due to patient leaving prior to being seen by health care provider: Secondary | ICD-10-CM | POA: Insufficient documentation

## 2017-04-05 DIAGNOSIS — R42 Dizziness and giddiness: Secondary | ICD-10-CM | POA: Insufficient documentation

## 2017-04-05 LAB — URINALYSIS, ROUTINE W REFLEX MICROSCOPIC
Bilirubin Urine: NEGATIVE
Glucose, UA: 50 mg/dL — AB
HGB URINE DIPSTICK: NEGATIVE
KETONES UR: NEGATIVE mg/dL
Nitrite: NEGATIVE
PROTEIN: NEGATIVE mg/dL
Specific Gravity, Urine: 1.021 (ref 1.005–1.030)
pH: 5 (ref 5.0–8.0)

## 2017-04-05 LAB — COMPREHENSIVE METABOLIC PANEL
ALK PHOS: 51 U/L (ref 38–126)
ALT: 32 U/L (ref 14–54)
AST: 64 U/L — AB (ref 15–41)
Albumin: 3.8 g/dL (ref 3.5–5.0)
Anion gap: 15 (ref 5–15)
BILIRUBIN TOTAL: 0.4 mg/dL (ref 0.3–1.2)
BUN: 19 mg/dL (ref 6–20)
CALCIUM: 9.7 mg/dL (ref 8.9–10.3)
CO2: 22 mmol/L (ref 22–32)
Chloride: 103 mmol/L (ref 101–111)
Creatinine, Ser: 1.21 mg/dL — ABNORMAL HIGH (ref 0.44–1.00)
GFR calc Af Amer: 51 mL/min — ABNORMAL LOW (ref >60)
GFR calc non Af Amer: 44 mL/min — ABNORMAL LOW (ref >60)
GLUCOSE: 197 mg/dL — AB (ref 65–99)
Potassium: 3.8 mmol/L (ref 3.5–5.1)
Sodium: 140 mmol/L (ref 135–145)
TOTAL PROTEIN: 6.9 g/dL (ref 6.5–8.1)

## 2017-04-05 LAB — CBC WITH DIFFERENTIAL/PLATELET
Basophils Absolute: 0 10*3/uL (ref 0.0–0.1)
Basophils Relative: 0 %
EOS PCT: 2 %
Eosinophils Absolute: 0.2 10*3/uL (ref 0.0–0.7)
HEMATOCRIT: 35.8 % — AB (ref 36.0–46.0)
Hemoglobin: 12 g/dL (ref 12.0–15.0)
LYMPHS ABS: 2.7 10*3/uL (ref 0.7–4.0)
Lymphocytes Relative: 34 %
MCH: 30.8 pg (ref 26.0–34.0)
MCHC: 33.5 g/dL (ref 30.0–36.0)
MCV: 91.8 fL (ref 78.0–100.0)
MONO ABS: 0.5 10*3/uL (ref 0.1–1.0)
MONOS PCT: 7 %
NEUTROS ABS: 4.5 10*3/uL (ref 1.7–7.7)
Neutrophils Relative %: 57 %
PLATELETS: 279 10*3/uL (ref 150–400)
RBC: 3.9 MIL/uL (ref 3.87–5.11)
RDW: 15.9 % — AB (ref 11.5–15.5)
WBC: 8 10*3/uL (ref 4.0–10.5)

## 2017-04-05 LAB — I-STAT TROPONIN, ED: Troponin i, poc: 0 ng/mL (ref 0.00–0.08)

## 2017-04-05 NOTE — ED Notes (Signed)
Called pt to reassess. No answer X2

## 2017-04-05 NOTE — ED Triage Notes (Signed)
Patient reports feeling lightheaded with generalized weakness and nausea this evening , intermittent left side neck pain and left upper arm pain for several weeks .

## 2017-04-05 NOTE — ED Notes (Signed)
Called Pt to reassess. Pt did not annswer

## 2017-04-05 NOTE — ED Notes (Signed)
Pt's family stated they can not wait any longer. Family and Pt left.

## 2017-04-20 NOTE — Progress Notes (Signed)
Cardiology Office Note:    Date:  04/21/2017   ID:  [name], DOB Feb 25, 1946, MRN [id number]  PCP:  Dana Graves, DO  Cardiologist:  No primary care provider on file.   Referring MD: Dana Graves, DO   Chief Complaint  Patient presents with  . Follow-up    pre-syncope    History of Present Illness:    [name] is a 72 y.o. female with a hx of CAD s/p CABG x 2 and Paul (2005) for paroxysmal atrial fibrillation on eliquis, DM, peripheral neuropathy, HTN, s/p PPM insertion 06/05/14 for SSS, negative myoview 2014, OSA, HLD, and obesity.  This is a follow up after reporting to the ED for lightheadedness and generalized weakness with nausea and vomiting. She also had intermittent left neck pian and left arm pain for several weeks. However, pt left ED waiting room before being seen.   Prior to this visit, she was seen by [name] in clinic on 12/26/16 and by [name] in clinic on 12/11/16. She was hospitalized  08/2015 for Afib RVR with rate control with diltiazem. Echo during that hospitalization with normal function and normal wall motion.  In follow-up with [name] on 11/2015 she had normal pacemaker function and was maintaining normal sinus rhythm on amiodarone 200 mg daily.  She has also seen [name] for sleep apnea.  Diltiazem was discontinued for mild edema.  She continues Eliquis for anticoagulation and continues amiodarone 200 mg for paroxysmal A. Fib.  Follows with [name] status post pacemaker insertion for posttermination pauses with syncope.  12/2016 with appropriate pacemaker function.  No medication changes.  She presents today for follow-up of an aborted ED visit.  According to the EDP notes, this visit was for nausea, vomiting, lightheadedness, dizziness, and overall weakness.  Pt states that this is incorrect.  She states that she had an episode of head numbness and then had a cold illness, presyncope.  She felt better sitting up however she also had  neck pain and left arm pain.  The episode lasted only seconds. They left the ER after waiting for 4 hours.  She had the same episode the following evening on April 06, 2017.  She states that she has been sick with upper respiratory infections since October 2018.  She was unable to send a manual transmission for her pacemaker because of no cell service in their home last manual transmission was on March 24, 2017, pacemaker battery and function was appropriate.  She denies chest pain, exertional symptoms, and palpitations.  She has not had any syncopal episodes.  She is at baseline short of breath with activity, this is not new for her.  She states that she has been clammy on and off.   EKG today shows atrial paced rhythm.  Pacemaker was interrogated today and showed adequate battery life normal PPM function. She is atrial pacing 99% of the time; no AT or Afib episodes.   She has been compliant on her Eliquis and aspirin, denies bleeding problems.  She is taking fenofibrate, but is no longer taking Lipitor.  We will repeat LFTs and lipid profile today.  I will refer her to the lipid clinic for a PCSK9 inhibitor.  She takes a daily Lasix for lower extremity swelling, echo in 2018 with normal left ventricular ejection fraction but grade 2 diastolic dysfunction.  She is compliant on her losartan and blood pressures have been controlled at home.  She continues amiodarone without issues.  Past Medical History:  Diagnosis Date  . Asthmatic bronchitis   . CHF (congestive heart failure) ([place]) dx'd 2016  . Coronary atherosclerosis of native coronary artery    a. s/p CABG 2005 Dr [name]    . Hyperlipidemia   . Hypertension   . Hypertensive heart disease   . Kidney stones   . Mixed hyperlipidemia   . Paroxysmal atrial fibrillation ([place])    a. s/p Paul in 2005 b. recurrence in March 2016 with >8sec posttermination pauses s/p PPM implant    . Pneumonia 1950   "double"  . Presence of permanent cardiac  pacemaker   . Sleep apnea    "tested; mask ordered; couldn't afford it; will get it now" (09/04/2015)  . TIA (transient ischemic attack)   . Type II diabetes mellitus ([place])   . Uterine cancer [facility])    age 69 with partial hysterectomy    Past Surgical History:  Procedure Laterality Date  . CARDIAC CATHETERIZATION  2005  . CORONARY ARTERY BYPASS GRAFT  2005   Dr [name]  . CYSTOSCOPY WITH URETEROSCOPY, STONE BASKETRY AND STENT PLACEMENT Left 09/08/2014   Procedure: CYSTOSCOPY WITH URETEROSCOPY, STONE BASKETRY AND STENT PLACEMENT;  Surgeon: Dana Gustin, MD;  Location: [facility];  Service: Urology;  Laterality: Left;  . CYSTOSCOPY/URETEROSCOPY/HOLMIUM LASER/STENT PLACEMENT Left 09/15/2014   Procedure: CYSTOSCOPY/RETROGRADE/URETEROSCOPY/HOLMIUM LASER/ STONE EXTRACTION /STENT EXCHANGE;  Surgeon: Dana Gustin, MD;  Location: [facility];  Service: Urology;  Laterality: Left;  . HOLMIUM LASER APPLICATION Left 9/32/6712   Procedure: HOLMIUM LASER APPLICATION;  Surgeon: Dana Gustin, MD;  Location: [facility];  Service: Urology;  Laterality: Left;  . HOLMIUM LASER APPLICATION Left 06/19/8097   Procedure: HOLMIUM LASER APPLICATION;  Surgeon: Dana Gustin, MD;  Location: [facility];  Service: Urology;  Laterality: Left;  . INSERT / REPLACE / REMOVE PACEMAKER    . LAPAROSCOPIC CHOLECYSTECTOMY    . 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 VAGINAL HYSTERECTOMY  1975    Current Medications: Current Meds  Medication Sig  . acetaminophen (TYLENOL) 500 MG tablet Take 500-1,000 mg by mouth every 6 (six) hours as needed for moderate pain.  [name] amiodarone (PACERONE) 200 MG tablet Take amiodarone 200 mg (one tablet) by mouth daily Monday through Saturday.  Do not take amiodarone on Sunday.  [name] apixaban (ELIQUIS) 5 MG TABS tablet Take 1 tablet (5 mg total) by mouth 2 (two) times daily. Take 1st dose 06-07-14  .  aspirin 81 MG tablet Take 81 mg by mouth daily.   . B Complex-Biotin-FA (B-COMPLEX PO) Take 1 capsule by mouth every morning.   [name] BIOTIN PO Take 1 tablet by mouth daily.  . Cholecalciferol (VITAMIN D3) 1000 UNITS CAPS Take 1,000 Units by mouth daily.  . Coenzyme Q10 (CO Q-10) 200 MG CAPS Take 200 mg by mouth daily.  . diphenhydramine-acetaminophen (TYLENOL PM) 25-500 MG TABS tablet Take 1 tablet by mouth at bedtime as needed (sleep).  . fenofibrate 160 MG tablet Take 1 tablet (160 mg total) by mouth daily.  . furosemide (LASIX) 40 MG tablet Take 40 mg by mouth daily. Can take 1 extra tablet by mouth daily as needed for swelling/edema  . gabapentin (NEURONTIN) 300 MG capsule Take 300 mg by mouth at bedtime.   . GuaiFENesin (TUSSIN PO) Take by mouth 2 (two) times daily as needed (Cough/congestion). Pt unsure of strength  . insulin aspart  protamine- aspart (NOVOLOG MIX 70/30) (70-30) 100 UNIT/ML injection Inject 35 Units into the skin 2 (two) times daily with a meal.   . losartan (COZAAR) 50 MG tablet Take 75 mg (1/2 tab) daily for one week. Then take 50 mg twice daily.  . metFORMIN (GLUCOPHAGE) 500 MG tablet Take 500 mg by mouth 2 (two) times daily with a meal.   . metoprolol succinate (TOPROL-XL) 100 MG 24 hr tablet Take 1 tablet daily with or immediatelyfollowing a meal.  . NEOMYCIN-POLYMYXIN-HYDROCORTISONE (CORTISPORIN) 1 % SOLN otic solution Place 4 drops into both ears 2 (two) times daily as needed. For pain and itching  . Omega-3 Fatty Acids (FISH OIL PO) Take 2 capsules by mouth 2 (two) times daily.  . potassium chloride (K-DUR,KLOR-CON) 10 MEQ tablet Take 10 mEq by mouth every morning.   . [DISCONTINUED] atorvastatin (LIPITOR) 10 MG tablet Take 1 tablet (10 mg total) by mouth daily.     Allergies:   Patient has no known allergies.   Social History   Socioeconomic History  . Marital status: Married    Spouse name: Not on file  . Number of children: Not on file  . Years of education:  Not on file  . Highest education level: Not on file  Social Needs  . Financial resource strain: Not on file  . Food insecurity - worry: Not on file  . Food insecurity - inability: Not on file  . Transportation needs - medical: Not on file  . Transportation needs - non-medical: Not on file  Occupational History  . Not on file  Tobacco Use  . Smoking status: Never Smoker  . Smokeless tobacco: Never Used  Substance and Sexual Activity  . Alcohol use: No  . Drug use: No  . Sexual activity: Not on file  Other Topics Concern  . Not on file  Social History Narrative  . Not on file     Family History: The patient's family history includes Arthritis in her brother; Diabetes in her father; Heart attack in her maternal grandfather; Heart disease in her father and mother; Hyperlipidemia in her mother; Hypertension in her daughter, mother, and sister; Stroke in her father and paternal grandfather.  ROS:   Please see the history of present illness.     All other systems reviewed and are negative.  EKGs/Labs/Other Studies Reviewed:    The following studies were reviewed today:  Echo 07/03/16: Study Conclusions - Left ventricle: The cavity size was normal. There was moderate   concentric hypertrophy. Systolic function was vigorous. The   estimated ejection fraction was in the range of 65% to 70%. Wall   motion was normal; there were no regional wall motion   abnormalities. Features are consistent with a pseudonormal left   ventricular filling pattern, with concomitant abnormal relaxation   and increased filling pressure (grade 2 diastolic dysfunction). - Aortic valve: Transvalvular velocity was increased. There was no   stenosis. - Aorta: Aortic root dimension: 38 mm (ED). - Ascending aorta: The ascending aorta was mildly dilated. - Mitral valve: Calcified annulus. Mildly thickened leaflets . The   findings are consistent with moderate stenosis. - Right ventricle: The cavity size was  mildly dilated. Wall   thickness was normal.   EKG:  EKG is ordered today.  The ekg ordered today demonstrates what appears to be an A-paced rhythm, HR 67 bpm.  Recent Labs: 04/05/2017: ALT 32; BUN 19; Creatinine, Ser 1.21; Hemoglobin 12.0; Platelets 279; Potassium 3.8; Sodium 140  Recent  Lipid Panel    Component Value Date/Time   CHOL 218 (H) 09/08/2016 0910   TRIG 212 (H) 09/08/2016 0910   HDL 46 (L) 09/08/2016 0910   CHOLHDL 4.7 09/08/2016 0910   VLDL 42 (H) 09/08/2016 0910   LDLCALC 130 (H) 09/08/2016 0910    Physical Exam:    VS:  BP 134/82   Pulse 67   Ht _0  (1.626 m)   Wt 241 lb (109.3 kg)   BMI 41.37 kg/m     Wt Readings from Last 3 Encounters:  04/21/17 241 lb (109.3 kg)  04/05/17 242 lb (109.8 kg)  12/17/16 246 lb (111.6 kg)     GEN: Well nourished, well developed in no acute distress HEENT: Normal NECK: No JVD; + carotid bruits LYMPHATICS: No lymphadenopathy CARDIAC: RRR, no murmurs, rubs, gallops RESPIRATORY:  Clear to auscultation without rales, wheezing or rhonchi  ABDOMEN: Soft, non-tender, non-distended MUSCULOSKELETAL:  Trace edema; No deformity  SKIN: Warm and dry NEUROLOGIC:  Alert and oriented x 3 PSYCHIATRIC:  Normal affect   ASSESSMENT:    1. Pre-syncope   2. Paroxysmal atrial fibrillation (HCC) Chronic  3. 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   4. Pacemaker- March 2016    5. Hypertensive heart disease with heart failure (HCC) Chronic  6. Mixed hyperlipidemia   7. Hx of CABG 2005   8. Poorly controlled diabetes mellitus (HCC) Chronic  9. Morbid obesity ([place]) Chronic   PLAN:    In order of problems listed above:  1. Pre-syncope Pt states she had two episodes of feelings of presyncope. The episodes lasted only a few seconds. PPM interrogated with normal function. I have low suspicion that this was related to a cardiac problem. She has not had anymore episodes and questions if this was related to her recent URI.   2. Paroxysmal  Afib, SSS s/p pacemaker in place (2016) EKG today with A-pacing. Continue eliquis, amiodarone, and toprol. PPM interrogated today with adequate battery life and normal function. She atrial paces approximately 99% of the time. The PPM has captures no episodes of atrial tachycardia or atrial fibrillation.   3. HTN, hypertensive heart disease Continue losartan. Pressures have been well-controlled.   4. HLD No longer taking lipitor 10 mg; is taking fenofibrate. 09/08/2016: Cholesterol 218; HDL 46; LDL Cholesterol 130; Triglycerides 212; VLDL 42 LDL not at goal with her known disease.  Will check lipid profile today along with LFTs.  Will refer to lipid clinic for consideration of PCSK9 inhibitor therapy.  5. CAD s/p CABG x 2 (2005) No chest pain. HLD as above.  Given her report of symptoms on January 20 and January 21, I have low suspicion for an ACS process.  Will defer ischemic evaluation at this time.  6. Hx of poorly controlled diabetes A1c 8.9% 09/04/15. Will defer to PCP. Consider jardiance given her known CAD.  7. Obesity Continue to encourage weight loss. She needs to follow a low sodium diet.   Medication Adjustments/Labs and Tests Ordered: Current medicines are reviewed at length with the patient today.  Concerns regarding medicines are outlined above.  Orders Placed This Encounter  Procedures  . Comprehensive Metabolic Panel (CMET)  . Lipid Profile  . EKG 12-Lead   No orders of the defined types were placed in this encounter.   Signed, Ledora Bottcher, PA  04/21/2017 10:16 AM    [facility]

## 2017-04-21 ENCOUNTER — Ambulatory Visit: Payer: Medicare Other | Admitting: Physician Assistant

## 2017-04-21 ENCOUNTER — Encounter: Payer: Self-pay | Admitting: Physician Assistant

## 2017-04-21 VITALS — BP 134/82 | HR 67 | Ht 64.0 in | Wt 241.0 lb

## 2017-04-21 DIAGNOSIS — E782 Mixed hyperlipidemia: Secondary | ICD-10-CM

## 2017-04-21 DIAGNOSIS — I495 Sick sinus syndrome: Secondary | ICD-10-CM | POA: Diagnosis not present

## 2017-04-21 DIAGNOSIS — I110 Hypertensive heart disease with heart failure: Secondary | ICD-10-CM | POA: Diagnosis not present

## 2017-04-21 DIAGNOSIS — E1165 Type 2 diabetes mellitus with hyperglycemia: Secondary | ICD-10-CM

## 2017-04-21 DIAGNOSIS — R55 Syncope and collapse: Secondary | ICD-10-CM | POA: Diagnosis not present

## 2017-04-21 DIAGNOSIS — I480 Paroxysmal atrial fibrillation: Secondary | ICD-10-CM

## 2017-04-21 DIAGNOSIS — Z951 Presence of aortocoronary bypass graft: Secondary | ICD-10-CM

## 2017-04-21 DIAGNOSIS — Z950 Presence of cardiac pacemaker: Secondary | ICD-10-CM

## 2017-04-21 LAB — COMPREHENSIVE METABOLIC PANEL
ALK PHOS: 43 IU/L (ref 39–117)
ALT: 35 IU/L — ABNORMAL HIGH (ref 0–32)
AST: 50 IU/L — AB (ref 0–40)
Albumin/Globulin Ratio: 1.5 (ref 1.2–2.2)
Albumin: 4.4 g/dL (ref 3.5–4.8)
BILIRUBIN TOTAL: 0.3 mg/dL (ref 0.0–1.2)
BUN / CREAT RATIO: 24 (ref 12–28)
BUN: 20 mg/dL (ref 8–27)
CHLORIDE: 99 mmol/L (ref 96–106)
CO2: 25 mmol/L (ref 20–29)
CREATININE: 0.82 mg/dL (ref 0.57–1.00)
Calcium: 10 mg/dL (ref 8.7–10.3)
GFR calc Af Amer: 84 mL/min/{1.73_m2} (ref >59)
GFR calc non Af Amer: 73 mL/min/{1.73_m2} (ref >59)
GLOBULIN, TOTAL: 2.9 g/dL (ref 1.5–4.5)
Glucose: 188 mg/dL — ABNORMAL HIGH (ref 65–99)
POTASSIUM: 4.5 mmol/L (ref 3.5–5.2)
SODIUM: 142 mmol/L (ref 134–144)
Total Protein: 7.3 g/dL (ref 6.0–8.5)

## 2017-04-21 LAB — LIPID PANEL
CHOLESTEROL TOTAL: 241 mg/dL — AB (ref 100–199)
Chol/HDL Ratio: 4.8 ratio — ABNORMAL HIGH (ref 0.0–4.4)
HDL: 50 mg/dL (ref >39)
LDL CALC: 140 mg/dL — AB (ref 0–99)
TRIGLYCERIDES: 255 mg/dL — AB (ref 0–149)
VLDL Cholesterol Cal: 51 mg/dL — ABNORMAL HIGH (ref 5–40)

## 2017-04-21 NOTE — Patient Instructions (Addendum)
Medication Instructions:  DISCONTINUE Lipitor  Eliquis Samples  Labwork: Your physician recommends that you return for lab work in: TODAY-LIPIDS, CMET  Testing/Procedures: NONE  Follow-Up: KEEP FOLLOW UP APPOINTMENT AS [place].  Your physician recommends that you schedule a follow-up appointment in: [place] with Pharm D  Any Other Special Instructions Will Be Listed Below (If Applicable). If you need a refill on your cardiac medications before your next appointment, please call your pharmacy.

## 2017-04-21 NOTE — Progress Notes (Signed)
Comprehensive pacemaker check in office for recent episodes of pre-syncope. Normal device function: generator and lead parameters all normal. No recent episodes of atrial mode switch or high ventricular rates. 99% A paced-V sensed. Appropriate heart rate histogram.  Continue remote downloads Q 3 months

## 2017-04-27 LAB — CUP PACEART INCLINIC DEVICE CHECK
Date Time Interrogation Session: 20190211144056
Implantable Lead Implant Date: 20160321
Implantable Lead Location: 753859
Implantable Lead Model: 5076
Lead Channel Setting Pacing Amplitude: 2.5 V
MDC IDC LEAD IMPLANT DT: 20160321
MDC IDC LEAD LOCATION: 753860
MDC IDC PG IMPLANT DT: 20160321
MDC IDC SET LEADCHNL RA PACING AMPLITUDE: 2 V
MDC IDC SET LEADCHNL RV PACING PULSEWIDTH: 0.4 ms
MDC IDC SET LEADCHNL RV SENSING SENSITIVITY: 0.9 mV

## 2017-04-30 ENCOUNTER — Ambulatory Visit: Payer: Medicare Other

## 2017-05-07 ENCOUNTER — Ambulatory Visit (INDEPENDENT_AMBULATORY_CARE_PROVIDER_SITE_OTHER): Payer: Medicare Other | Admitting: Pharmacist

## 2017-05-07 ENCOUNTER — Encounter: Payer: Self-pay | Admitting: Pharmacist

## 2017-05-07 DIAGNOSIS — E782 Mixed hyperlipidemia: Secondary | ICD-10-CM

## 2017-05-07 MED ORDER — PRAVASTATIN SODIUM 20 MG PO TABS: 20.0000 mg | ORAL_TABLET | Freq: Every day | ORAL | 3 refills | Status: DC

## 2017-05-07 NOTE — Assessment & Plan Note (Addendum)
LDL remains elevated for secondary prevention and patient is unable to tolerate multiple statins including: atorvastatin, simvastatin and rosuvastatin. She will like trial with pitavastatin but this medication is NOT covered by her insurance and she will not be able to afford it.  She is willing to try pravastatin 38m daily plus lifestyle modifications for 3 months, then re-assess response. If her LDL drops < 90 mg/dL with statin alone, we will add ezetimibe. If her LDL remains >90 mg/dL after statin therapy, we will initiate prior-authorization for PCSK9i.   PCSK9i (Repatha/Praluent) were discussed during visit. Counseling included: Prior authorization process, storage, administration, common side effects, and financial responsibilities.

## 2017-05-07 NOTE — Progress Notes (Signed)
Patient ID: [name]                 DOB: 24-Apr-1945                    MRN: [id number]     HPI: [name] is a 72 y.o. female patient of Dana Claiborne Paul referred to lipid clinic by Dana Deforest PA. PMH is significant for HF, CAD s/p CABG 2005, hyperlipidemia, hypertension, Atrial fibrillation s/p MAZE in 2005, TIA, and DM-II. Noted intolerance to multiple statins in the past. Per patient history she tried atorvastatin, rosuvastatin, and simvastatin in the past. She will likea  trial with Lovalo (pitavastatin) prior to considering PCSK9i.  Current Medications: Fenofibrate 156m daily   Intolerances:  Atorvastatin 149mdaily Atorvastatin 1066mvery other day Simvastatin Rosuvastatin  LDL goal: <59m71m  Diet: working on better choices for diabetes and cholesterol amangement  Exercise: activities of daily leaving. Plan to start daily walks with daughter   Family History: Arthritis in her brother; Diabetes in her father; Heart attack in her maternal grandfather; Heart disease in her father and mother; Hyperlipidemia in her mother; Hypertension in her daughter, mother, and sister; Stroke in her father and paternal grandfather.  Social History: denies tobacco or alcohol use  Labs: 04/21/2017:  CHO 241; TG 255; HDL 50; LDL-c 140 (no pharmacotherapy)  Past Medical History:  Diagnosis Date  . Asthmatic bronchitis   . CHF (congestive heart failure) ([name])[place]'d 2016  . Coronary atherosclerosis of native coronary artery    a. s/p CABG 2005 [name]. Hyperlipidemia   . Hypertension   . Hypertensive heart disease   . Kidney stones   . Mixed hyperlipidemia   . Paroxysmal atrial fibrillation ([name])[name] a. s/p Maze in 2005 b. recurrence in March 2016 with >8sec posttermination pauses s/p PPM implant    . Pneumonia 1950   "double"  . Presence of permanent cardiac pacemaker   . Sleep apnea    "tested; mask ordered; couldn't afford it; will get it now" (09/04/2015)  . TIA (transient  ischemic attack)   . Type II diabetes mellitus ([name])[name] 65 [facility]    Current Outpatient Medications on File Prior to Visit  Medication Sig Dispense Refill  . acetaminophen (TYLENOL) 500 MG tablet Take 500-1,000 mg by mouth every 6 (six) hours as needed for moderate pain.    . amMarland Kitchenodarone (PACERONE) 200 MG tablet Take amiodarone 200 mg (one tablet) by mouth daily Monday through Saturday.  Do not take amiodarone on Sunday. 90 tablet 3  . apixaban (ELIQUIS) 5 MG TABS tablet Take 1 tablet (5 mg total) by mouth 2 (two) times daily. Take 1st dose 06-07-14 60 tablet 1  . aspirin 81 MG tablet Take 81 mg by mouth daily.     . B Complex-Biotin-FA (B-COMPLEX PO) Take 1 capsule by mouth every morning.     . BIMarland KitchenTIN PO Take 1 tablet by mouth daily.    . Cholecalciferol (VITAMIN D3) 1000 UNITS CAPS Take 1,000 Units by mouth daily.    . Coenzyme Q10 (CO Q-10) 200 MG CAPS Take 200 mg by mouth daily.    . diphenhydramine-acetaminophen (TYLENOL PM) 25-500 MG TABS tablet Take 1 tablet by mouth at bedtime as needed (sleep).    . fenofibrate 160 MG tablet Take 1 tablet (160 mg total) by mouth daily. 90 tablet 0  .  furosemide (LASIX) 40 MG tablet Take 40 mg by mouth daily. Can take 1 extra tablet by mouth daily as needed for swelling/edema    . gabapentin (NEURONTIN) 300 MG capsule Take 300 mg by mouth at bedtime.     . GuaiFENesin (TUSSIN PO) Take by mouth 2 (two) times daily as needed (Cough/congestion). Pt unsure of strength    . insulin aspart protamine- aspart (NOVOLOG MIX 70/30) (70-30) 100 UNIT/ML injection Inject 35 Units into the skin 2 (two) times daily with a meal.     . losartan (COZAAR) 50 MG tablet Take 75 mg (1/2 tab) daily for one week. Then take 50 mg twice daily. 180 tablet 3  . metFORMIN (GLUCOPHAGE) 500 MG tablet Take 500 mg by mouth 2 (two) times daily with a meal.     . metoprolol succinate (TOPROL-XL) 100 MG 24 hr tablet Take 1 tablet daily with  or immediatelyfollowing a meal. 30 tablet 0  . NEOMYCIN-POLYMYXIN-HYDROCORTISONE (CORTISPORIN) 1 % SOLN otic solution Place 4 drops into both ears 2 (two) times daily as needed. For pain and itching    . Omega-3 Fatty Acids (FISH OIL PO) Take 2 capsules by mouth 2 (two) times daily.    . potassium chloride (K-DUR,KLOR-CON) 10 MEQ tablet Take 10 mEq by mouth every morning.      No current facility-administered medications on file prior to visit.     No Known Allergies  Mixed hyperlipidemia LDL remains elevated for secondary prevention and patient is unable to tolerate multiple statins including: atorvastatin, simvastatin and rosuvastatin. She will like trial with pitavastatin but this medication is NOT covered by her insurance and she will not be able to afford it.  She is willing to try pravastatin 72m daily plus lifestyle modifications for 3 months, then re-assess response. If her LDL drops < 90 mg/dL with statin alone, we will add ezetimibe. If her LDL remains >90 mg/dL after statin therapy, we will initiate prior-authorization for PCSK9i.   PCSK9i (Repatha/Praluent) were discussed during visit. Counseling included: Prior authorization process, storage, administration, common side effects, and financial responsibilities.   [name] PharmD, BCPS, [id number]/21/2019 9:30 PM

## 2017-05-07 NOTE — Patient Instructions (Addendum)
  [facility] (pharmacist) Marquarius Lofton/Kristin [phone]  *Start pravastatin 12m every evening* *Repeat fasting blood work in 3 months if tolerating*  Cholesterol Cholesterol is a fat. Your body needs a small amount of cholesterol. Cholesterol (plaque) may build up in your blood vessels (arteries). That makes you more likely to have a heart attack or stroke. You cannot feel your cholesterol level. Having a blood test is the only way to find out if your level is high. Keep your test results. Work with your doctor to keep your cholesterol at a good level. What do the results mean?  Total cholesterol is how much cholesterol is in your blood.  LDL is bad cholesterol. This is the type that can build up. Try to have low LDL.  HDL is good cholesterol. It cleans your blood vessels and carries LDL away. Try to have high HDL.  Triglycerides are fat that the body can store or burn for energy. What are good levels of cholesterol?  Total cholesterol below 200.  LDL below 100 is good for people who have health risks. LDL below 70 is good for people who have very high risks.  HDL above 40 is good. It is best to have HDL of 60 or higher.  Triglycerides below 150. How can I lower my cholesterol? Diet Follow your diet program as told by your doctor.  Choose fish, white meat chicken, or t[place] is roasted or baked. Try not to eat red meat, fried foods, sausage, or lunch meats.  Eat lots of fresh fruits and vegetables.  Choose whole grains, beans, pasta, potatoes, and cereals.  Choose olive oil, corn oil, or canola oil. Only use small amounts.  Try not to eat butter, mayonnaise, shortening, or palm kernel oils.  Try not to eat foods with trans fats.  Choose low-fat or nonfat dairy foods. ? Drink skim or nonfat milk. ? Eat low-fat or nonfat yogurt and cheeses. ? Try not to drink whole milk or cream. ? Try not to eat ice cream, egg yolks, or full-fat cheeses.  Healthy desserts  include angel food cake, ginger snaps, animal crackers, hard candy, popsicles, and low-fat or nonfat frozen yogurt. Try not to eat pastries, cakes, pies, and cookies.  Exercise Follow your exercise program as told by your doctor.  Be more active. Try gardening, walking, and taking the stairs.  Ask your doctor about ways that you can be more active.  Medicine  Take over-the-counter and prescription medicines only as told by your doctor. This information is not intended to replace advice given to you by your health care provider. Make sure you discuss any questions you have with your health care provider. Document Released: 05/30/2008 Document Revised: 10/03/2015 Document Reviewed: 09/13/2015 Elsevier Interactive Patient Education  2[employer]

## 2017-06-02 ENCOUNTER — Other Ambulatory Visit: Payer: Self-pay

## 2017-06-02 ENCOUNTER — Telehealth: Payer: Self-pay | Admitting: *Deleted

## 2017-06-02 NOTE — Telephone Encounter (Signed)
SAMPLES OF ELIQUIS 5 MG #5 LOT [id number] EXP 6/21

## 2017-06-02 NOTE — Telephone Encounter (Signed)
Given to patient

## 2017-06-04 ENCOUNTER — Other Ambulatory Visit: Payer: Self-pay | Admitting: *Deleted

## 2017-06-04 MED ORDER — PRAVASTATIN SODIUM 20 MG PO TABS: 20.0000 mg | ORAL_TABLET | Freq: Every day | ORAL | 2 refills | Status: DC

## 2017-06-16 ENCOUNTER — Other Ambulatory Visit: Payer: Self-pay | Admitting: Cardiovascular Disease

## 2017-06-16 NOTE — Telephone Encounter (Signed)
Rx has been sent to the pharmacy electronically.

## 2017-06-22 ENCOUNTER — Telehealth: Payer: Self-pay | Admitting: Cardiology

## 2017-06-22 ENCOUNTER — Ambulatory Visit (INDEPENDENT_AMBULATORY_CARE_PROVIDER_SITE_OTHER): Payer: Medicare Other | Admitting: *Deleted

## 2017-06-22 DIAGNOSIS — I480 Paroxysmal atrial fibrillation: Secondary | ICD-10-CM

## 2017-06-22 NOTE — Telephone Encounter (Signed)
Spoke with pt and reminded pt of remote transmission that is due today. Pt verbalized understanding.   

## 2017-06-23 NOTE — Progress Notes (Signed)
Remote pacemaker transmission.

## 2017-07-13 LAB — CUP PACEART REMOTE DEVICE CHECK
Battery Remaining Longevity: 86 mo
Battery Voltage: 3.01 V
Brady Statistic AP VS Percent: 99.64 %
Brady Statistic AS VS Percent: 0.3 %
Date Time Interrogation Session: 20190408183652
Implantable Lead Implant Date: 20160321
Implantable Lead Location: 753859
Implantable Pulse Generator Implant Date: 20160321
Lead Channel Impedance Value: 475 Ohm
Lead Channel Pacing Threshold Amplitude: 0.625 V
Lead Channel Pacing Threshold Amplitude: 0.75 V
Lead Channel Pacing Threshold Pulse Width: 0.4 ms
Lead Channel Sensing Intrinsic Amplitude: 1.5 mV
Lead Channel Sensing Intrinsic Amplitude: 1.5 mV
Lead Channel Sensing Intrinsic Amplitude: 19.5 mV
Lead Channel Setting Pacing Amplitude: 2.5 V
MDC IDC LEAD IMPLANT DT: 20160321
MDC IDC LEAD LOCATION: 753860
MDC IDC MSMT LEADCHNL RA IMPEDANCE VALUE: 361 Ohm
MDC IDC MSMT LEADCHNL RA PACING THRESHOLD PULSEWIDTH: 0.4 ms
MDC IDC MSMT LEADCHNL RV IMPEDANCE VALUE: 570 Ohm
MDC IDC MSMT LEADCHNL RV IMPEDANCE VALUE: 608 Ohm
MDC IDC MSMT LEADCHNL RV SENSING INTR AMPL: 19.5 mV
MDC IDC SET LEADCHNL RA PACING AMPLITUDE: 2 V
MDC IDC SET LEADCHNL RV PACING PULSEWIDTH: 0.4 ms
MDC IDC SET LEADCHNL RV SENSING SENSITIVITY: 0.9 mV
MDC IDC STAT BRADY AP VP PERCENT: 0.06 %
MDC IDC STAT BRADY AS VP PERCENT: 0 %
MDC IDC STAT BRADY RA PERCENT PACED: 99.67 %
MDC IDC STAT BRADY RV PERCENT PACED: 0.06 %

## 2017-07-20 ENCOUNTER — Other Ambulatory Visit: Payer: Self-pay | Admitting: Cardiovascular Disease

## 2017-07-20 NOTE — Telephone Encounter (Signed)
REFILL

## 2017-07-28 ENCOUNTER — Ambulatory Visit: Payer: Medicare Other | Admitting: Cardiovascular Disease

## 2017-07-28 ENCOUNTER — Encounter: Payer: Self-pay | Admitting: Cardiovascular Disease

## 2017-07-28 VITALS — BP 140/76 | HR 67 | Ht 64.0 in | Wt 243.0 lb

## 2017-07-28 DIAGNOSIS — Z951 Presence of aortocoronary bypass graft: Secondary | ICD-10-CM | POA: Diagnosis not present

## 2017-07-28 DIAGNOSIS — Z79899 Other long term (current) drug therapy: Secondary | ICD-10-CM | POA: Diagnosis not present

## 2017-07-28 DIAGNOSIS — Z7901 Long term (current) use of anticoagulants: Secondary | ICD-10-CM | POA: Diagnosis not present

## 2017-07-28 DIAGNOSIS — I480 Paroxysmal atrial fibrillation: Secondary | ICD-10-CM | POA: Diagnosis not present

## 2017-07-28 DIAGNOSIS — Z950 Presence of cardiac pacemaker: Secondary | ICD-10-CM | POA: Diagnosis not present

## 2017-07-28 DIAGNOSIS — E782 Mixed hyperlipidemia: Secondary | ICD-10-CM

## 2017-07-28 DIAGNOSIS — G4733 Obstructive sleep apnea (adult) (pediatric): Secondary | ICD-10-CM | POA: Diagnosis not present

## 2017-07-28 DIAGNOSIS — I2510 Atherosclerotic heart disease of native coronary artery without angina pectoris: Secondary | ICD-10-CM | POA: Diagnosis not present

## 2017-07-28 MED ORDER — EZETIMIBE 10 MG PO TABS: 10.0000 mg | ORAL_TABLET | Freq: Every day | ORAL | 3 refills | Status: DC

## 2017-07-28 NOTE — Progress Notes (Signed)
Patient ID: [name], female   DOB: 09-15-1945, 72 y.o.   MRN: [id number]   Primary M.D.: Dr. [name]  HPI: [name] is a 72 year-old female who presents for an 8 month follow-up cardiology and sleep evaluation.    [name] admits to a history of CAD, diabetes mellitus, peripheral neuropathy, and hypertension.   She underwent CABG revascularization surgery x2 with a maze procedure for atrial fibrillation at that time by Dr. Roxy Manns.  She experienced 2 episodes of palpitations where she felt her heart was temporarily out of rhythm. She denied associated chest tightness. She does note some shortness of breath. . A 2-D echo Doppler study on 10/05/2012 showed normal systolic function with an ejection fraction of 55-60% with mild tissue Doppler abnormality. Her aortic valve was trileaflet and sclerotic with very mild stenosis with a mean gradient of 10 and a peak gradient of 20 mm respectively. There is trivial AR. She also had posterior calcified mitral annulus with mild MR.  A nuclear perfusion study in 2014 revealed mild breast attenuation but otherwise normal perfusion.   She underwent permanent pacemaker insertion by Dr. Crissie Sickles.  She also has had issues with kidney stones and underwent 2 urological procedures in June and July 2016 on her left kidney by Dr. Nicolette Bang.  She developed atrial fibrillation in March and again in June 2017 which occurred at night after drinking cold juice.  She does snore.  She was hospitalized overnight after she presented with AF with a rapid ventricular rate at 129.  She was started on IV diltiazem with ultimate improvement and eventually her oral dose of Cardizem was increased.  Troponins were negative.  An echo during that hospitalization showed an EF of 60-65%.  There was normal wall motion.  There was mild aortic valve stenosis and she had a mean gradient of 10 and peak gradient of 20 with a valve area of 1.35.  She was seen in follow-up by  [name] and was remaining stable.  She saw Dr. [name] on 12/04/2015 and had normal pacemaker function and was maintaining normal sinus rhythm over 99% of the time on her current dose of amiodarone 200 mg.    With her cardiovascular comorbidities and recurrent AF  I recommended that she undergo a sleep study to evaluate for sleep apnea.  This was done on 01/22/2016 and revealed moderate sleep apnea overall within AHI 15.4.  However, sleep apnea was severe during rems sleep at 36 per hour.  Her oxygen nadir was 83%.  She was scheduled to undergo a CPAP titration, but she canceled this appointment in January due to some other conflict.  When I last saw her, she had not yet had her CPAP titration.  I strongly recommended that she have this done.  He Pap titration was done on 07/30/2016 and she was titrated up to 15 cm water pressure.  She had a significant PLMS index of 85.6.  She has been on gabapentin for neuropathy, which will also treat restless legs.  She underwent an echo Doppler study on 07/03/2016 which showed an EF of 60-65%.  She had grade 2 diastolic dysfunction.  She had an upper normal to mildly increased.  The ascending aorta and a thickened mitral valve suggesting moderate stenosis.  There was a mild gradient across her aortic valve.  She had developed mild edema on diltiazem, which improved once this was discontinued.  She denies chest pain.  She continues to take eliquis for anticoagulation  and continues to be on amiodarone 200 mg for her PAF.   She has a new CPAP ResMed AirSense 10 AutoSet unit; setup was 09/30/2016.  She has a ResMed airFit 20 medium tsize mask.  A download was obtained from August 26 through 12/08/2016.  She is meeting compliance standards and had 93%.  Days of usage and 87% of days with usage greater than 4 hours.  However, she was only averaging 5 hours of sleep per night.  She is set at a 15 cm water pressure.  Her AHI is excellent at 0.1.  Used her machine were recently  due to the fact that she has a broken tooth.  This had resulted in significant teeth pain during the night with her unit on going into this region.  She was recently started on antibiotics by a dentist and will need tooth extraction.  She is sleeping well with her CPAP.  She denies breakthrough snoring.  She denies daytime sleepiness.    Since I last saw her, she developed an episode of head numbness with lightheadedness and dizziness which resulted in presenting to the emergency room in January 2019.  However, after waiting 4 hours in the waiting room after her blood work was obtained, she ultimately left.  She was seen in the office in follow-up by Fabian Sharp, [place] on April 21, 2017.  At that time her pacemaker was interrogated and showed adequate battery life with normal pacemaker function.  She was atrially paced 99% of the time.  There were no episodes of atrial tachycardia or atrial fibrillation.  She saw Dr. Crissie Sickles for follow-up evaluation and remote pacemaker device check from July 13, 2017 was normal.  She has a history of mixed hyperlipidemia and has had  issues with statin intolerance.  Laboratory in February 2019 showed a total cholesterol to 441, HDL 50, LDL 140, triglycerides 255.  VLDL was 51.  She was seen by Raquel Salvatore Decent and she was given a trial of pravastatin 20 mg to wear to her fenofibrate and make a 3 fatty acid.  He seems to tolerate this low dose.  She does have some feet and toe numbness which is most likely not related.  She presents for reevaluation.  Past Medical History:  Diagnosis Date  . Asthmatic bronchitis   . CHF (congestive heart failure) ([place]) dx'd 2016  . Coronary atherosclerosis of native coronary artery    a. s/p CABG 2005 Dr Roxy Manns    . Hyperlipidemia   . Hypertension   . Hypertensive heart disease   . Kidney stones   . Mixed hyperlipidemia   . Paroxysmal atrial fibrillation ([place])    a. s/p Maze in 2005 b. recurrence in March 2016 with >8sec  posttermination pauses s/p PPM implant    . Pneumonia 1950   "double"  . Presence of permanent cardiac pacemaker   . Sleep apnea    "tested; mask ordered; couldn't afford it; will get it now" (09/04/2015)  . TIA (transient ischemic attack)   . Type II diabetes mellitus ([place])   . Uterine cancer [facility])    age 25 with partial hysterectomy    Past Surgical History:  Procedure Laterality Date  . CARDIAC CATHETERIZATION  2005  . CORONARY ARTERY BYPASS GRAFT  2005   Dr Roxy Manns with MAZE  . CYSTOSCOPY WITH URETEROSCOPY, STONE BASKETRY AND STENT PLACEMENT Left 09/08/2014   Procedure: CYSTOSCOPY WITH URETEROSCOPY, STONE BASKETRY AND STENT PLACEMENT;  Surgeon: Cleon Gustin, MD;  Location: [facility];  Service: Urology;  Laterality: Left;  . CYSTOSCOPY/URETEROSCOPY/HOLMIUM LASER/STENT PLACEMENT Left 09/15/2014   Procedure: CYSTOSCOPY/RETROGRADE/URETEROSCOPY/HOLMIUM LASER/ STONE EXTRACTION /STENT EXCHANGE;  Surgeon: Cleon Gustin, MD;  Location: [facility];  Service: Urology;  Laterality: Left;  . HOLMIUM LASER APPLICATION Left 3/66/4403   Procedure: HOLMIUM LASER APPLICATION;  Surgeon: Cleon Gustin, MD;  Location: [facility] ORS;  Service: Urology;  Laterality: Left;  . HOLMIUM LASER APPLICATION Left 06/21/4257   Procedure: HOLMIUM LASER APPLICATION;  Surgeon: Cleon Gustin, MD;  Location: [facility] ORS;  Service: Urology;  Laterality: Left;  . INSERT / REPLACE / REMOVE PACEMAKER    . LAPAROSCOPIC CHOLECYSTECTOMY    . 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 VAGINAL HYSTERECTOMY  1975    No Known Allergies  Current Outpatient Medications  Medication Sig Dispense Refill  . acetaminophen (TYLENOL) 500 MG tablet Take 500-1,000 mg by mouth every 6 (six) hours as needed for moderate pain.    [name] amiodarone (PACERONE) 200 MG tablet Take amiodarone 200 mg (one tablet) by mouth daily Monday through Saturday.  Do not take  amiodarone on Sunday. 90 tablet 3  . apixaban (ELIQUIS) 5 MG TABS tablet Take 1 tablet (5 mg total) by mouth 2 (two) times daily. Take 1st dose 06-07-14 60 tablet 1  . aspirin 81 MG tablet Take 81 mg by mouth daily.     . B Complex-Biotin-FA (B-COMPLEX PO) Take 1 capsule by mouth every morning.     [name] BIOTIN PO Take 1 tablet by mouth daily.    . Cholecalciferol (VITAMIN D3) 1000 UNITS CAPS Take 1,000 Units by mouth daily.    . Coenzyme Q10 (CO Q-10) 200 MG CAPS Take 200 mg by mouth daily.    . diphenhydramine-acetaminophen (TYLENOL PM) 25-500 MG TABS tablet Take 1 tablet by mouth at bedtime as needed (sleep).    . fenofibrate 160 MG tablet Take 1 tablet (160 mg total) by mouth daily. 90 tablet 1  . furosemide (LASIX) 40 MG tablet Take 40 mg by mouth daily. Can take 1 extra tablet by mouth daily as needed for swelling/edema    . gabapentin (NEURONTIN) 300 MG capsule Take 300 mg by mouth at bedtime.     . GuaiFENesin (TUSSIN PO) Take by mouth 2 (two) times daily as needed (Cough/congestion). Pt unsure of strength    . insulin aspart protamine- aspart (NOVOLOG MIX 70/30) (70-30) 100 UNIT/ML injection Inject 35 Units into the skin 2 (two) times daily with a meal.     . losartan (COZAAR) 50 MG tablet TAKE 1 TABLET UPTO TWICE A DAY AS DIRECTED 180 tablet 2  . metFORMIN (GLUCOPHAGE) 500 MG tablet Take 500 mg by mouth 2 (two) times daily with a meal.     . metoprolol succinate (TOPROL-XL) 100 MG 24 hr tablet Take 1 tablet daily with or immediatelyfollowing a meal. 30 tablet 0  . NEOMYCIN-POLYMYXIN-HYDROCORTISONE (CORTISPORIN) 1 % SOLN otic solution Place 4 drops into both ears 2 (two) times daily as needed. For pain and itching    . Omega-3 Fatty Acids (FISH OIL PO) Take 2 capsules by mouth 2 (two) times daily.    . potassium chloride (K-DUR,KLOR-CON) 10 MEQ tablet Take 10 mEq by mouth every morning.     . pravastatin (PRAVACHOL) 20 MG tablet Take 1 tablet (20 mg total) by mouth daily. 90 tablet 2  .  ezetimibe (ZETIA) 10 MG tablet Take 1  tablet (10 mg total) by mouth daily. 90 tablet 3   No current facility-administered medications for this visit.     Socially she is married to [employer]; she has 2 children one grandchild. She is retired. She completed ninth grade education. Has no history of tobacco use. She is not drink alcohol. She does walk approximately 2-3 times per week.  Family History  Problem Relation Age of Onset  . Hypertension Mother   . Hyperlipidemia Mother   . Heart disease Mother   . Diabetes Father   . Stroke Father   . Heart disease Father   . Hypertension Sister        3 sisters  . Arthritis Brother   . Heart attack Maternal Grandfather   . Stroke Paternal Grandfather   . Hypertension Daughter    ROS General: Negative; No fevers, chills, or night sweats;  HEENT: Negative; No changes in vision or hearing, sinus congestion, difficulty swallowing Pulmonary: Negative; No cough, wheezing, shortness of breath, hemoptysis Cardiovascular: He history of present illness GI: Negative; No nausea, vomiting, diarrhea, or abdominal pain GU: Positive for recent left uteroscopic stone manipulation with laser lithotripsy for left kidney stone Musculoskeletal: Negative; no myalgias, joint pain, or weakness Hematologic/Oncology: Negative; no easy bruising, bleeding Endocrine: Positive for diabetes mellitus Neuro: Negative; no changes in balance, headaches Skin: Negative; No rashes or skin lesions Psychiatric: Negative; No behavioral problems, depression Sleep: Positive for OSA, Recently started on CPAP therapy;  no daytime sleepiness, hypersomnolence, bruxism, restless legs, hypnogognic hallucinations, no cataplexy Other comprehensive 14 point system review is negative.   PE BP 140/76   Pulse 67   Ht _0  (1.626 m)   Wt 243 lb (110.2 kg)   BMI 41.71 kg/m    Repeat blood pressure 136/76  Wt Readings from Last 3 Encounters:  07/28/17 243 lb (110.2 kg)    04/21/17 241 lb (109.3 kg)  04/05/17 242 lb (109.8 kg)   General: Alert, oriented, no distress.  Skin: normal turgor, no rashes, warm and dry HEENT: Normocephalic, atraumatic. Pupils equal round and reactive to light; sclera anicteric; extraocular muscles intact;  Nose without nasal septal hypertrophy Mouth/Parynx benign; Mallinpatti scale 3 Neck: No JVD, no carotid bruits; normal carotid upstroke Lungs: clear to ausculatation and percussion; no wheezing or rales Chest wall: without tenderness to palpitation Heart: PMI not displaced, RRR, s1 s2 normal, 1/6 systolic murmur, no diastolic murmur, no rubs, gallops, thrills, or heaves Abdomen moderate central adiposity; soft, nontender; no hepatosplenomehaly, BS+; abdominal aorta nontender and not dilated by palpation. Back: no CVA tenderness Pulses 2+ Musculoskeletal: full range of motion, normal strength, no joint deformities Extremities: no clubbing cyanosis or edema, Homan's sign negative  Neurologic: grossly nonfocal; Cranial nerves grossly wnl Psychologic: Normal mood and affect  ECG (independently read by me): Atrially paced rhythm with ventricular rate at 67 bpm.  Prolonged AV conduction with PR interval 272 ms.  QTC prolongation _1 ms.  Nonspecific ST-T changes.   September 2018 ECG (independently read by me): Atrially paced rhythm at 83 bpm with prolonged AV conduction with a PR interval 284 ms.  QTc interval 484 ms.  Nonspecific ST changes.  March 2018 ECG (independently read by me): Atrially paced rhythm at 81 bpm.  Prolonged AV conduction with a PR interval at 258 ms.  QTc interval 473 ms.  Nonspecific ST-T changes.  September 2017 ECG (independently read by me): Atrially paced rhythm with a PR interval at 248.  Complete right bundle branch  block.  QTc interval 458 ms.  March 2017 ECG (independently read by me):  Atrially paced rhythm with prolonged AV conduction with PR interval 232 ms.  Nonspecific ST-T  changes.  September 2016 ECG (independently read by me): A paced rhythm at 63 with prolonged AV conduction with a PR 214.  QTC normal at 446 ms.  July 2015 ECG (independently read by me): Sinus rhythm with short PR 88 ms.  Nonspecific T changes.  Incomplete right bundle branch block  10/2013 ECG: Sinus rhythm at 69 beats per minute. QTc interval 445 ms.  LABS: BMP Latest Ref Rng & Units 04/21/2017 04/05/2017 09/05/2015  Glucose 65 - 99 mg/dL 188(H) 197(H) 275(H)  BUN 8 - 27 mg/dL _0 Creatinine 0.57 - 1.00 mg/dL 0.82 1.21(H) 0.90  BUN/Creat Ratio 12 - 28 24 - -  Sodium 134 - 144 mmol/L 142 140 136  Potassium 3.5 - 5.2 mmol/L 4.5 3.8 3.7  Chloride 96 - 106 mmol/L 99 103 100(L)  CO2 20 - 29 mmol/L _1 Calcium 8.7 - 10.3 mg/dL 10.0 9.7 9.4   Hepatic Function Latest Ref Rng & Units 04/21/2017 04/05/2017 09/08/2016  Total Protein 6.0 - 8.5 g/dL 7.3 6.9 6.9  Albumin 3.5 - 4.8 g/dL 4.4 3.8 4.0  AST 0 - 40 IU/L 50(H) 64(H) 44(H)  ALT 0 - 32 IU/L 35(H) 32 26  Alk Phosphatase 39 - 117 IU/L 43 51 48  Total Bilirubin 0.0 - 1.2 mg/dL 0.3 0.4 0.3  Bilirubin, Direct <=0.2 mg/dL - - 0.1   CBC Latest Ref Rng & Units 04/05/2017 09/04/2015 06/07/2015  WBC 4.0 - 10.5 K/uL 8.0 10.2 8.4  Hemoglobin 12.0 - 15.0 g/dL 12.0 12.7 12.3  Hematocrit 36.0 - 46.0 % 35.8(L) 39.3 37.2  Platelets 150 - 400 K/uL 279 306 283   Lab Results  Component Value Date   MCV 91.8 04/05/2017   MCV 91.4 09/04/2015   MCV 90.1 06/07/2015   Lab Results  Component Value Date   TSH 2.088 09/05/2015   Lab Results  Component Value Date   HGBA1C 8.9 (H) 09/04/2015   Lipid Panel     Component Value Date/Time   CHOL 241 (H) 04/21/2017 0944   TRIG 255 (H) 04/21/2017 0944   HDL 50 04/21/2017 0944   CHOLHDL 4.8 (H) 04/21/2017 0944   CHOLHDL 4.7 09/08/2016 0910   VLDL 42 (H) 09/08/2016 0910   LDLCALC 140 (H) 04/21/2017 0944   Laboratory from Dr. Hughie Closs office from   February 22,017. Lipid studies revealed a  total cholesterol 186, LDL cholesterol 80, triglycerides 333 and VLDL cholesterol 67. Her glucose was 283.   Laboratory from Dr. Hughie Closs office from 11/27/2014: Fasting glucose 220, BUN 16, creatinine 0.66.  CO2 30.5.  Hemoglobin 12.1, hematocrit 37.3.  Hemoglobin A1c 9.4.  Total cholesterol 204, triglycerides 261, HDL 44, LDL 108, VLDL 52.   RADIOLOGY: No results found.  IMPRESSION:  1. Mixed hyperlipidemia   2. Coronary artery disease involving native coronary artery of native heart without angina pectoris   3. Hx of CABG 2005   4. Medication management   5. OSA (obstructive sleep apnea)   6. Paroxysmal atrial fibrillation ([facility])   7. Chronic anticoagulation   8. Pacemaker- March 2016    9. Morbid obesity ([place])     ASSESSMENT AND PLAN:    Ms. Hulsebus is a 72 year old female who has a history of type 2 diabetes mellitus and is status post CABG surgery  x2 by Dr. Roxy Manns in 2005. She has  a history of atrial fibrillation and underwent a Maze procedure at the time of her surgery.  She is status post permanent pacemaker insertion by Dr. [name].  Presently, with reference to blood pressure, BP is controlled on losartan 50 mg, furosemide 40 mg and Toprol-XL 100 mg daily.  She has not had any further atrial fibrillation and is on amiodarone 200 mg on Monday through Saturday and she does not take this on Sunday.  Her cha2ds2Vasc score is 5 by virtue of her female sex, age, CAD, diabetes mellitus and hypertensive history.She is on anticoagulation with Eliquis and denies bleeding.  I reviewed her most recent lipid studies.  She now is on low-dose pravastatin at 20 mg, fenofibrate, and omega-3 fatty acid.  I discussed with her today options of either increasing pravastatin to 40 mg which may only add an additional 7% LDL reduction compared to the current 20 mg dose or starting Zetia 10 mg which may at 20 to 25% additional reduction.  We will add Zetia 10 mg to her regimen.  In 2 months repeat  laboratory will be obtained with goal less than 70.  If she is not able to achieve less than 70 we will then try to further titrate pravastatin if she is able to tolerate it or consider PCSK9 inhibition.  She is morbidly obese.  We discussed her diet.  She loves eating chocolate.  Triglycerides VLDL are elevated.  We discussed exercise.  She has continued to use CPAP therapy.  Most recent download demonstrates good compliance however I have discussed improvement in sleep duration.  AHI is excellent at 0.5 at a 15 cm water pressure.  I will see her in 3 months for reevaluation.        Time spent: 25 minutes  Troy Sine, MD, [facility] 07/28/2017 10:27 AM

## 2017-07-28 NOTE — Patient Instructions (Signed)
Medication Instructions:  START Zetia 10 mg daily  Labwork: Please return for FASTING labs in 2 months (CMET,Lipid)  Our in office lab hours are Monday-Friday 8:00-4:00, closed for lunch 12:45-1:45 pm.  No appointment needed.  Follow-Up: 3 months with Dr. Claiborne Billings  Any Other Special Instructions Will Be Listed Below (If Applicable).     If you need a refill on your cardiac medications before your next appointment, please call your pharmacy.

## 2017-09-21 ENCOUNTER — Ambulatory Visit (INDEPENDENT_AMBULATORY_CARE_PROVIDER_SITE_OTHER): Payer: Medicare Other | Admitting: *Deleted

## 2017-09-21 ENCOUNTER — Telehealth: Payer: Self-pay | Admitting: Cardiology

## 2017-09-21 DIAGNOSIS — I480 Paroxysmal atrial fibrillation: Secondary | ICD-10-CM

## 2017-09-21 NOTE — Telephone Encounter (Signed)
LMOVM reminding pt to send remote transmission.

## 2017-09-22 NOTE — Progress Notes (Signed)
Remote pacemaker transmission.   

## 2017-09-25 LAB — CUP PACEART REMOTE DEVICE CHECK
Battery Remaining Longevity: 84 mo
Brady Statistic AP VS Percent: 99.74 %
Brady Statistic AS VP Percent: 0 %
Brady Statistic AS VS Percent: 0.21 %
Date Time Interrogation Session: 20190709135622
Implantable Lead Implant Date: 20160321
Implantable Lead Location: 753859
Implantable Lead Model: 5076
Implantable Lead Model: 5076
Lead Channel Impedance Value: 361 Ohm
Lead Channel Pacing Threshold Amplitude: 0.625 V
Lead Channel Pacing Threshold Pulse Width: 0.4 ms
Lead Channel Sensing Intrinsic Amplitude: 1.375 mV
Lead Channel Sensing Intrinsic Amplitude: 1.375 mV
Lead Channel Sensing Intrinsic Amplitude: 19 mV
Lead Channel Sensing Intrinsic Amplitude: 19 mV
Lead Channel Setting Pacing Amplitude: 2.5 V
Lead Channel Setting Pacing Pulse Width: 0.4 ms
MDC IDC LEAD IMPLANT DT: 20160321
MDC IDC LEAD LOCATION: 753860
MDC IDC MSMT BATTERY VOLTAGE: 3.01 V
MDC IDC MSMT LEADCHNL RA IMPEDANCE VALUE: 475 Ohm
MDC IDC MSMT LEADCHNL RA PACING THRESHOLD AMPLITUDE: 0.75 V
MDC IDC MSMT LEADCHNL RA PACING THRESHOLD PULSEWIDTH: 0.4 ms
MDC IDC MSMT LEADCHNL RV IMPEDANCE VALUE: 551 Ohm
MDC IDC MSMT LEADCHNL RV IMPEDANCE VALUE: 589 Ohm
MDC IDC PG IMPLANT DT: 20160321
MDC IDC SET LEADCHNL RA PACING AMPLITUDE: 2 V
MDC IDC SET LEADCHNL RV SENSING SENSITIVITY: 0.9 mV
MDC IDC STAT BRADY AP VP PERCENT: 0.05 %
MDC IDC STAT BRADY RA PERCENT PACED: 99.77 %
MDC IDC STAT BRADY RV PERCENT PACED: 0.05 %

## 2017-10-20 LAB — LIPID PANEL
CHOLESTEROL TOTAL: 188 mg/dL (ref 100–199)
Chol/HDL Ratio: 4.1 ratio (ref 0.0–4.4)
HDL: 46 mg/dL (ref >39)
LDL CALC: 95 mg/dL (ref 0–99)
TRIGLYCERIDES: 237 mg/dL — AB (ref 0–149)
VLDL CHOLESTEROL CAL: 47 mg/dL — AB (ref 5–40)

## 2017-10-20 LAB — COMPREHENSIVE METABOLIC PANEL
ALBUMIN: 4.3 g/dL (ref 3.5–4.8)
ALK PHOS: 46 IU/L (ref 39–117)
ALT: 27 IU/L (ref 0–32)
AST: 53 IU/L — AB (ref 0–40)
Albumin/Globulin Ratio: 1.6 (ref 1.2–2.2)
BUN / CREAT RATIO: 17 (ref 12–28)
BUN: 20 mg/dL (ref 8–27)
Bilirubin Total: 0.4 mg/dL (ref 0.0–1.2)
CO2: 26 mmol/L (ref 20–29)
CREATININE: 1.17 mg/dL — AB (ref 0.57–1.00)
Calcium: 10 mg/dL (ref 8.7–10.3)
Chloride: 94 mmol/L — ABNORMAL LOW (ref 96–106)
GFR calc Af Amer: 54 mL/min/{1.73_m2} — ABNORMAL LOW (ref >59)
GFR calc non Af Amer: 47 mL/min/{1.73_m2} — ABNORMAL LOW (ref >59)
Globulin, Total: 2.7 g/dL (ref 1.5–4.5)
Glucose: 259 mg/dL — ABNORMAL HIGH (ref 65–99)
Potassium: 4.9 mmol/L (ref 3.5–5.2)
Sodium: 137 mmol/L (ref 134–144)
Total Protein: 7 g/dL (ref 6.0–8.5)

## 2017-11-19 ENCOUNTER — Encounter: Payer: Self-pay | Admitting: Cardiovascular Disease

## 2017-11-19 ENCOUNTER — Ambulatory Visit: Payer: Medicare Other | Admitting: Cardiovascular Disease

## 2017-11-19 VITALS — BP 126/54 | HR 84 | Ht 64.0 in | Wt 241.6 lb

## 2017-11-19 DIAGNOSIS — I10 Essential (primary) hypertension: Secondary | ICD-10-CM

## 2017-11-19 DIAGNOSIS — Z79899 Other long term (current) drug therapy: Secondary | ICD-10-CM

## 2017-11-19 DIAGNOSIS — I480 Paroxysmal atrial fibrillation: Secondary | ICD-10-CM | POA: Diagnosis not present

## 2017-11-19 DIAGNOSIS — Z951 Presence of aortocoronary bypass graft: Secondary | ICD-10-CM

## 2017-11-19 DIAGNOSIS — I2510 Atherosclerotic heart disease of native coronary artery without angina pectoris: Secondary | ICD-10-CM

## 2017-11-19 DIAGNOSIS — E782 Mixed hyperlipidemia: Secondary | ICD-10-CM | POA: Diagnosis not present

## 2017-11-19 DIAGNOSIS — G4733 Obstructive sleep apnea (adult) (pediatric): Secondary | ICD-10-CM

## 2017-11-19 MED ORDER — PRAVASTATIN SODIUM 40 MG PO TABS: 40.0000 mg | ORAL_TABLET | Freq: Every day | ORAL | 3 refills | Status: DC

## 2017-11-19 NOTE — Patient Instructions (Signed)
Medication Instructions:  INCREASE pravastatin to 40 mg daily  Labwork: Please return for FASTING labs in 2 months (CM[facility],Lipid)  Our in office lab hours are Monday-Friday 8:00-4:00, closed for lunch 12:45-1:45 pm.  No appointment needed.  Follow-Up: Your physician wants you to follow-up in: 4 months with Dr. Claiborne Billings.  You will receive a reminder letter in the mail two months in advance. If you don't receive a letter, please call our office to schedule the follow-up appointment.   Any Other Special Instructions Will Be Listed Below (If Applicable).     If you need a refill on your cardiac medications before your next appointment, please call your pharmacy.

## 2017-11-19 NOTE — Progress Notes (Signed)
Patient ID: [name], female   DOB: October 21, 1945, 72 y.o.   MRN: [id number]   Primary M.D.: Dr. [name]  HPI: [name] is a 72 year-old female who presents for an 4 month follow-up cardiology and sleep evaluation.    [name] admits to a history of CAD, diabetes mellitus, peripheral neuropathy, and hypertension.   She underwent CABG revascularization surgery x2 with a maze procedure for atrial fibrillation at that time by Dr. Roxy Manns.  She experienced 2 episodes of palpitations where she felt her heart was temporarily out of rhythm. She denied associated chest tightness. She does note some shortness of breath. . A 2-D echo Doppler study on 10/05/2012 showed normal systolic function with an ejection fraction of 55-60% with mild tissue Doppler abnormality. Her aortic valve was trileaflet and sclerotic with very mild stenosis with a mean gradient of 10 and a peak gradient of 20 mm respectively. There is trivial AR. She also had posterior calcified mitral annulus with mild MR.  A nuclear perfusion study in 2014 revealed mild breast attenuation but otherwise normal perfusion.   She underwent permanent pacemaker insertion by Dr. Crissie Sickles.  She also has had issues with kidney stones and underwent 2 urological procedures in June and July 2016 on her left kidney by Dr. Nicolette Bang.  She developed atrial fibrillation in March and again in June 2017 which occurred at night after drinking cold juice.  She does snore.  She was hospitalized overnight after she presented with AF with a rapid ventricular rate at 129.  She was started on IV diltiazem with ultimate improvement and eventually her oral dose of Cardizem was increased.  Troponins were negative.  An echo during that hospitalization showed an EF of 60-65%.  There was normal wall motion.  There was mild aortic valve stenosis and she had a mean gradient of 10 and peak gradient of 20 with a valve area of 1.35.  She was seen in follow-up by  [name] and was remaining stable.  She saw Dr. [name] on 12/04/2015 and had normal pacemaker function and was maintaining normal sinus rhythm over 99% of the time on her current dose of amiodarone 200 mg.    With her cardiovascular comorbidities and recurrent AF  I recommended that she undergo a sleep study to evaluate for sleep apnea.  This was done on 01/22/2016 and revealed moderate sleep apnea overall within AHI 15.4.  However, sleep apnea was severe during rems sleep at 36 per hour.  Her oxygen nadir was 83%.  She was scheduled to undergo a CPAP titration, but she canceled this appointment in January due to some other conflict.  When I last saw her, she had not yet had her CPAP titration.  I strongly recommended that she have this done.  He Pap titration was done on 07/30/2016 and she was titrated up to 15 cm water pressure.  She had a significant PLMS index of 85.6.  She has been on gabapentin for neuropathy, which will also treat restless legs.  She underwent an echo Doppler study on 07/03/2016 which showed an EF of 60-65%.  She had grade 2 diastolic dysfunction.  She had an upper normal to mildly increased.  The ascending aorta and a thickened mitral valve suggesting moderate stenosis.  There was a mild gradient across her aortic valve.  She had developed mild edema on diltiazem, which improved once this was discontinued.  She denies chest pain.  She continues to take eliquis for anticoagulation  and continues to be on amiodarone 200 mg for her PAF.   She has a new CPAP ResMed AirSense 10 AutoSet unit; setup was 09/30/2016.  She has a ResMed airFit 20 medium tsize mask.  A download was obtained from August 26 through 12/08/2016.  She is meeting compliance standards and had 93%.  Days of usage and 87% of days with usage greater than 4 hours.  However, she was only averaging 5 hours of sleep per night.  She is set at a 15 cm water pressure.  Her AHI is excellent at 0.1.  Used her machine were recently  due to the fact that she has a broken tooth.  This had resulted in significant teeth pain during the night with her unit on going into this region.  She was recently started on antibiotics by a dentist and will need tooth extraction.  She is sleeping well with her CPAP.  She denies breakthrough snoring.  She denies daytime sleepiness.    Since I last saw her, she developed an episode of head numbness with lightheadedness and dizziness which resulted in presenting to the emergency room in January 2019.  However, after waiting 4 hours in the waiting room after her blood work was obtained, she ultimately left.  She was seen in the office in follow-up by Fabian Sharp, [place] on April 21, 2017.  At that time her pacemaker was interrogated and showed adequate battery life with normal pacemaker function.  She was atrially paced 99% of the time.  There were no episodes of atrial tachycardia or atrial fibrillation.  She saw Dr. Crissie Sickles for follow-up evaluation and remote pacemaker device check from July 13, 2017 was normal.  She has a history of mixed hyperlipidemia and has had  issues with statin intolerance.  Laboratory in February 2019 showed a total cholesterol to 441, HDL 50, LDL 140, triglycerides 255.  VLDL was 51.  She was seen by Raquel Salvatore Decent and she was given a trial of pravastatin 20 mg to take in addition to her fenofibrate and omega-3 3 fatty acid.    Since I last saw her in May 2019, she has had some issues with bronchitis.  She states that she believes she may be at times retaining some fluid.  She has been using sodium and typically add salt to tomatoes as well as apples.  She also eats food with high sodium content.  She denies any chest pressure.  She is unaware of palpitations.  She had undergone repeat laboratory since the initiation of pravastatin which showed significant improvement in her LDL cholesterol from 140 to 95 but LDL was still increased.  Triglycerides remain elevated at  237 with the LDL at 47.  She presents for evaluation.  Past Medical History:  Diagnosis Date  . Asthmatic bronchitis   . CHF (congestive heart failure) ([place]) dx'd 2016  . Coronary atherosclerosis of native coronary artery    a. s/p CABG 2005 Dr Roxy Manns    . Hyperlipidemia   . Hypertension   . Hypertensive heart disease   . Kidney stones   . Mixed hyperlipidemia   . Paroxysmal atrial fibrillation ([place])    a. s/p Maze in 2005 b. recurrence in March 2016 with >8sec posttermination pauses s/p PPM implant    . Pneumonia 1950   "double"  . Presence of permanent cardiac pacemaker   . Sleep apnea    "tested; mask ordered; couldn't afford it; will get it now" (09/04/2015)  . TIA (transient  ischemic attack)   . Type II diabetes mellitus ([place])   . Uterine cancer [facility])    age 41 with partial hysterectomy    Past Surgical History:  Procedure Laterality Date  . CARDIAC CATHETERIZATION  2005  . CORONARY ARTERY BYPASS GRAFT  2005   Dr Roxy Manns with MAZE  . CYSTOSCOPY WITH URETEROSCOPY, STONE BASKETRY AND STENT PLACEMENT Left 09/08/2014   Procedure: CYSTOSCOPY WITH URETEROSCOPY, STONE BASKETRY AND STENT PLACEMENT;  Surgeon: Cleon Gustin, MD;  Location: [facility];  Service: Urology;  Laterality: Left;  . CYSTOSCOPY/URETEROSCOPY/HOLMIUM LASER/STENT PLACEMENT Left 09/15/2014   Procedure: CYSTOSCOPY/RETROGRADE/URETEROSCOPY/HOLMIUM LASER/ STONE EXTRACTION /STENT EXCHANGE;  Surgeon: Cleon Gustin, MD;  Location: [facility];  Service: Urology;  Laterality: Left;  . HOLMIUM LASER APPLICATION Left 1/51/7616   Procedure: HOLMIUM LASER APPLICATION;  Surgeon: Cleon Gustin, MD;  Location: [facility];  Service: Urology;  Laterality: Left;  . HOLMIUM LASER APPLICATION Left 0/09/3708   Procedure: HOLMIUM LASER APPLICATION;  Surgeon: Cleon Gustin, MD;  Location: [facility];  Service: Urology;  Laterality: Left;  . INSERT / REPLACE / REMOVE PACEMAKER    . LAPAROSCOPIC CHOLECYSTECTOMY    . 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 VAGINAL HYSTERECTOMY  1975    No Known Allergies  Current Outpatient Medications  Medication Sig Dispense Refill  . acetaminophen (TYLENOL) 500 MG tablet Take 500-1,000 mg by mouth every 6 (six) hours as needed for moderate pain.    [name] amiodarone (PACERONE) 200 MG tablet Take amiodarone 200 mg (one tablet) by mouth daily Monday through Saturday.  Do not take amiodarone on Sunday. 90 tablet 3  . apixaban (ELIQUIS) 5 MG TABS tablet Take 1 tablet (5 mg total) by mouth 2 (two) times daily. Take 1st dose 06-07-14 60 tablet 1  . aspirin 81 MG tablet Take 81 mg by mouth daily.     . B Complex-Biotin-FA (B-COMPLEX PO) Take 1 capsule by mouth every morning.     [name] BIOTIN PO Take 1 tablet by mouth daily.    . Cholecalciferol (VITAMIN D3) 1000 UNITS CAPS Take 1,000 Units by mouth daily.    . Coenzyme Q10 (CO Q-10) 200 MG CAPS Take 200 mg by mouth daily.    . diphenhydramine-acetaminophen (TYLENOL PM) 25-500 MG TABS tablet Take 1 tablet by mouth at bedtime as needed (sleep).    . ezetimibe (ZETIA) 10 MG tablet Take 10 mg by mouth daily.    . fenofibrate 160 MG tablet Take 1 tablet (160 mg total) by mouth daily. 90 tablet 1  . furosemide (LASIX) 40 MG tablet Take 40 mg by mouth daily. Can take 1 extra tablet by mouth daily as needed for swelling/edema    . gabapentin (NEURONTIN) 300 MG capsule Take 300 mg by mouth at bedtime.     . GuaiFENesin (TUSSIN PO) Take by mouth 2 (two) times daily as needed (Cough/congestion). Pt unsure of strength    . insulin aspart protamine- aspart (NOVOLOG MIX 70/30) (70-30) 100 UNIT/ML injection Inject 35 Units into the skin 2 (two) times daily with a meal.     . losartan (COZAAR) 50 MG tablet TAKE 1 TABLET UPTO TWICE A DAY AS DIRECTED 180 tablet 2  . metFORMIN (GLUCOPHAGE) 500 MG tablet Take 500 mg by mouth 2 (two) times daily with a meal.     . metoprolol succinate  (TOPROL-XL) 100 MG 24 hr tablet Take 1 tablet  daily with or immediatelyfollowing a meal. 30 tablet 0  . NEOMYCIN-POLYMYXIN-HYDROCORTISONE (CORTISPORIN) 1 % SOLN otic solution Place 4 drops into both ears 2 (two) times daily as needed. For pain and itching    . Omega-3 Fatty Acids (FISH OIL PO) Take 2 capsules by mouth 2 (two) times daily.    . potassium chloride (K-DUR,KLOR-CON) 10 MEQ tablet Take 10 mEq by mouth every morning.     . pravastatin (PRAVACHOL) 40 MG tablet Take 1 tablet (40 mg total) by mouth daily. 90 tablet 3  . ezetimibe (ZETIA) 10 MG tablet Take 1 tablet (10 mg total) by mouth daily. 90 tablet 3   No current facility-administered medications for this visit.     Socially she is married to [employer]; she has 2 children one grandchild. She is retired. She completed ninth grade education. Has no history of tobacco use. She is not drink alcohol. She does walk approximately 2-3 times per week.  Family History  Problem Relation Age of Onset  . Hypertension Mother   . Hyperlipidemia Mother   . Heart disease Mother   . Diabetes Father   . Stroke Father   . Heart disease Father   . Hypertension Sister        3 sisters  . Arthritis Brother   . Heart attack Maternal Grandfather   . Stroke Paternal Grandfather   . Hypertension Daughter    ROS General: Negative; No fevers, chills, or night sweats;  HEENT: Negative; No changes in vision or hearing, sinus congestion, difficulty swallowing Pulmonary: Negative; No cough, wheezing, shortness of breath, hemoptysis Cardiovascular: He history of present illness GI: Negative; No nausea, vomiting, diarrhea, or abdominal pain GU: Positive for recent left uteroscopic stone manipulation with laser lithotripsy for left kidney stone Musculoskeletal: Negative; no myalgias, joint pain, or weakness Hematologic/Oncology: Negative; no easy bruising, bleeding Endocrine: Positive for diabetes mellitus Neuro: Negative; no changes in balance,  headaches Skin: Negative; No rashes or skin lesions Psychiatric: Negative; No behavioral problems, depression Sleep: Positive for OSA, Recently started on CPAP therapy;  no daytime sleepiness, hypersomnolence, bruxism, restless legs, hypnogognic hallucinations, no cataplexy Other comprehensive 14 point system review is negative.   PE BP (!) 126/54   Pulse 84   Ht _0  (1.626 m)   Wt 241 lb 9.6 oz (109.6 kg)   BMI 41.47 kg/m    Repeat blood pressure by me was 124/60  Wt Readings from Last 3 Encounters:  11/19/17 241 lb 9.6 oz (109.6 kg)  07/28/17 243 lb (110.2 kg)  04/21/17 241 lb (109.3 kg)   General: Alert, oriented, no distress.  Skin: normal turgor, no rashes, warm and dry HEENT: Normocephalic, atraumatic. Pupils equal round and reactive to light; sclera anicteric; extraocular muscles intact;  Nose without nasal septal hypertrophy Mouth/Parynx benign; Mallinpatti scale 3 Neck: No JVD, no carotid bruits; normal carotid upstroke Lungs: clear to ausculatation and percussion; no wheezing or rales Chest wall: without tenderness to palpitation Heart: PMI not displaced, RRR, s1 s2 normal, 1/6 systolic murmur, no diastolic murmur, no rubs, gallops, thrills, or heaves Abdomen: Central adiposity; soft, nontender; no hepatosplenomehaly, BS+; abdominal aorta nontender and not dilated by palpation. Back: no CVA tenderness Pulses 2+ Musculoskeletal: full range of motion, normal strength, no joint deformities Extremities: Trivial lower extremity edema; no clubbing,cyanosis, Homan's sign negative  Neurologic: grossly nonfocal; Cranial nerves grossly wnl Psychologic: Normal mood and affect    ECG (independently read by me): Atrially paced at 84 bpm.  Incomplete right bundle  branch block.  Anterolateral ST abnormality.  May 2019 ECG (independently read by me): Atrially paced rhythm with ventricular rate at 67 bpm.  Prolonged AV conduction with PR interval 272 ms.  QTC prolongation _0 ms.  Nonspecific ST-T changes.  September 2018 ECG (independently read by me): Atrially paced rhythm at 83 bpm with prolonged AV conduction with a PR interval 284 ms.  QTc interval 484 ms.  Nonspecific ST changes.  March 2018 ECG (independently read by me): Atrially paced rhythm at 81 bpm.  Prolonged AV conduction with a PR interval at 258 ms.  QTc interval 473 ms.  Nonspecific ST-T changes.  September 2017 ECG (independently read by me): Atrially paced rhythm with a PR interval at 248.  Complete right bundle branch block.  QTc interval 458 ms.  March 2017 ECG (independently read by me):  Atrially paced rhythm with prolonged AV conduction with PR interval 232 ms.  Nonspecific ST-T changes.  September 2016 ECG (independently read by me): A paced rhythm at 63 with prolonged AV conduction with a PR 214.  QTC normal at 446 ms.  July 2015 ECG (independently read by me): Sinus rhythm with short PR 88 ms.  Nonspecific T changes.  Incomplete right bundle branch block  10/2013 ECG: Sinus rhythm at 69 beats per minute. QTc interval 445 ms.  LABS: BMP Latest Ref Rng & Units 10/20/2017 04/21/2017 04/05/2017  Glucose 65 - 99 mg/dL 259(H) 188(H) 197(H)  BUN 8 - 27 mg/dL _1 Creatinine 0.57 - 1.00 mg/dL 1.17(H) 0.82 1.21(H)  BUN/Creat Ratio 12 - _2 -  Sodium 134 - 144 mmol/L 137 142 140  Potassium 3.5 - 5.2 mmol/L 4.9 4.5 3.8  Chloride 96 - 106 mmol/L 94(L) 99 103  CO2 20 - 29 mmol/L _3 Calcium 8.7 - 10.3 mg/dL 10.0 10.0 9.7   Hepatic Function Latest Ref Rng & Units 10/20/2017 04/21/2017 04/05/2017  Total Protein 6.0 - 8.5 g/dL 7.0 7.3 6.9  Albumin 3.5 - 4.8 g/dL 4.3 4.4 3.8  AST 0 - 40 IU/L 53(H) 50(H) 64(H)  ALT 0 - 32 IU/L 27 35(H) 32  Alk Phosphatase 39 - 117 IU/L 46 43 51  Total Bilirubin 0.0 - 1.2 mg/dL 0.4 0.3 0.4  Bilirubin, Direct <=0.2 mg/dL - - -   CBC Latest Ref Rng & Units 04/05/2017 09/04/2015 06/07/2015  WBC 4.0 - 10.5 K/uL 8.0 10.2 8.4  Hemoglobin 12.0 - 15.0 g/dL 12.0  12.7 12.3  Hematocrit 36.0 - 46.0 % 35.8(L) 39.3 37.2  Platelets 150 - 400 K/uL 279 306 283   Lab Results  Component Value Date   MCV 91.8 04/05/2017   MCV 91.4 09/04/2015   MCV 90.1 06/07/2015   Lab Results  Component Value Date   TSH 2.088 09/05/2015   Lab Results  Component Value Date   HGBA1C 8.9 (H) 09/04/2015   Lipid Panel     Component Value Date/Time   CHOL 188 10/20/2017 1009   TRIG 237 (H) 10/20/2017 1009   HDL 46 10/20/2017 1009   CHOLHDL 4.1 10/20/2017 1009   CHOLHDL 4.7 09/08/2016 0910   VLDL 42 (H) 09/08/2016 0910   [place] 95 10/20/2017 1009   Laboratory from Dr. Hughie Closs office from   February 22,017. Lipid studies revealed a total cholesterol 186, LDL cholesterol 80, triglycerides 333 and VLDL cholesterol 67. Her glucose was 283.   Laboratory from Dr. Hughie Closs office from 11/27/2014: Fasting glucose 220, BUN 16,  creatinine 0.66.  CO2 30.5.  Hemoglobin 12.1, hematocrit 37.3.  Hemoglobin A1c 9.4.  Total cholesterol 204, triglycerides 261, HDL 44, LDL 108, VLDL 52.   RADIOLOGY: No results found.  IMPRESSION:  1. Mixed hyperlipidemia   2. Medication management   3. Essential hypertension   4. Paroxysmal atrial fibrillation ([facility])   5. Coronary artery disease involving native coronary artery of native heart without angina pectoris   6. Hx of CABG 2005   7. Morbid obesity ([place])   8. OSA (obstructive sleep apnea)     ASSESSMENT AND PLAN:   Ms. Bok is a 72 year old female who has a history of type 2 diabetes mellitus and is status post CABG surgery x2 by Dr. Roxy Manns in 2005. She has  a history of atrial fibrillation and underwent a Maze procedure at the time of her surgery.  She is status post permanent pacemaker insertion by Dr. [name].  Her blood pressure today is improved on her medical regimen now consisting of furosemide 40 mg daily, losartan 50 mg twice a day, and Toprol-XL 100 mg daily.  With reference to her hyperlipidemia she now  is on pravastatin 20 mg, fenofibrate 160 mg, zetia  in addition to omega-3 fatty acid.  Lipid studies are improved.  LDL is now 95.  For this reason I have recommended further titration of pravastatin up to 40 mg.  She will continue with Zetia which I started at her last visit and has tolerated. I discussed her diet and her high sodium content.  I have recommended she reduce her salt intake significantly.  She is maintaining sinus rhythm on amiodarone 20 mg on Monday and Saturday and continues to be on Eliquis for anticoagulation.  Peripheral neuropathy is controlled with gabapentin.  She is diabetic on metformin.  We discussed the importance of weight loss with her morbid obesity.  She continues to use CPAP therapy with 100% compliance and previous downloads have demonstrated good compliance.  We discussed improved sleep duration.  I will see her back in the office in 4 months for reevaluation.  Time spent: 25 minutes  Troy Sine, MD, [facility] 11/20/2017 2:13 PM

## 2017-11-20 ENCOUNTER — Encounter: Payer: Self-pay | Admitting: Cardiovascular Disease

## 2017-12-22 ENCOUNTER — Ambulatory Visit (INDEPENDENT_AMBULATORY_CARE_PROVIDER_SITE_OTHER): Payer: Medicare Other | Admitting: *Deleted

## 2017-12-22 DIAGNOSIS — I495 Sick sinus syndrome: Secondary | ICD-10-CM

## 2017-12-22 DIAGNOSIS — I480 Paroxysmal atrial fibrillation: Secondary | ICD-10-CM

## 2017-12-23 NOTE — Progress Notes (Signed)
Remote pacemaker transmission.   

## 2017-12-25 ENCOUNTER — Encounter: Payer: Self-pay | Admitting: Internal Medicine

## 2017-12-25 ENCOUNTER — Ambulatory Visit: Payer: Medicare Other | Admitting: Internal Medicine

## 2017-12-25 VITALS — BP 144/78 | HR 79 | Ht 64.0 in | Wt 240.4 lb

## 2017-12-25 DIAGNOSIS — R0600 Dyspnea, unspecified: Secondary | ICD-10-CM

## 2017-12-25 DIAGNOSIS — Z950 Presence of cardiac pacemaker: Secondary | ICD-10-CM | POA: Diagnosis not present

## 2017-12-25 DIAGNOSIS — I495 Sick sinus syndrome: Secondary | ICD-10-CM | POA: Diagnosis not present

## 2017-12-25 DIAGNOSIS — I480 Paroxysmal atrial fibrillation: Secondary | ICD-10-CM | POA: Diagnosis not present

## 2017-12-25 DIAGNOSIS — I10 Essential (primary) hypertension: Secondary | ICD-10-CM | POA: Diagnosis not present

## 2017-12-25 LAB — CUP PACEART INCLINIC DEVICE CHECK
Battery Remaining Longevity: 84 mo
Battery Voltage: 3.01 V
Brady Statistic AP VS Percent: 99.19 %
Brady Statistic AS VP Percent: 0 %
Brady Statistic RA Percent Paced: 99.27 %
Brady Statistic RV Percent Paced: 0.11 %
Date Time Interrogation Session: 20191011100356
Implantable Lead Implant Date: 20160321
Implantable Lead Location: 753859
Implantable Lead Location: 753860
Implantable Lead Model: 5076
Implantable Pulse Generator Implant Date: 20160321
Lead Channel Impedance Value: 494 Ohm
Lead Channel Impedance Value: 532 Ohm
Lead Channel Pacing Threshold Pulse Width: 0.4 ms
Lead Channel Sensing Intrinsic Amplitude: 0.25 mV
Lead Channel Sensing Intrinsic Amplitude: 16.125 mV
Lead Channel Sensing Intrinsic Amplitude: 17.375 mV
Lead Channel Setting Pacing Amplitude: 2 V
Lead Channel Setting Pacing Amplitude: 2.5 V
Lead Channel Setting Pacing Pulse Width: 0.4 ms
Lead Channel Setting Sensing Sensitivity: 0.9 mV
MDC IDC LEAD IMPLANT DT: 20160321
MDC IDC MSMT LEADCHNL RA IMPEDANCE VALUE: 342 Ohm
MDC IDC MSMT LEADCHNL RA IMPEDANCE VALUE: 475 Ohm
MDC IDC MSMT LEADCHNL RA PACING THRESHOLD AMPLITUDE: 0.75 V
MDC IDC MSMT LEADCHNL RA SENSING INTR AMPL: 1.125 mV
MDC IDC MSMT LEADCHNL RV PACING THRESHOLD AMPLITUDE: 0.5 V
MDC IDC MSMT LEADCHNL RV PACING THRESHOLD PULSEWIDTH: 0.4 ms
MDC IDC STAT BRADY AP VP PERCENT: 0.1 %
MDC IDC STAT BRADY AS VS PERCENT: 0.7 %

## 2017-12-25 MED ORDER — AMIODARONE HCL 200 MG PO TABS: ORAL_TABLET | ORAL | 3 refills | Status: DC

## 2017-12-25 MED ORDER — FUROSEMIDE 40 MG PO TABS: 80.0000 mg | ORAL_TABLET | Freq: Every day | ORAL | 3 refills | Status: DC

## 2017-12-25 NOTE — Patient Instructions (Addendum)
Medication Instructions:  Your physician has recommended you make the following change in your medication:   1.  Increase your lasix 40 mg---Take TWO tablets by mouth in the morning (total of 80 mg)  2.  Decrease your amiodarone 200 mg---Take one tablet by mouth Monday through Friday.  DO NOT take on Saturday or Sunday.  Labwork: You will get blood work today:  SED rate  Testing/Procedures: Your physician has recommended that you have a pulmonary function test. Pulmonary Function Tests are a group of tests that measure how well air moves in and out of your lungs.  Please schedule for PFT's  Follow-Up: Your physician wants you to follow-up in: one year with Dr. [name].   You will receive a reminder letter in the mail two months in advance. If you don't receive a letter, please call our office to schedule the follow-up appointment.  Remote monitoring is used to monitor your Pacemaker from home. This monitoring reduces the number of office visits required to check your device to one time per year. It allows [place] to keep an eye on the functioning of your device to ensure it is working properly. You are scheduled for a device check from home on 03/23/2018. You may send your transmission at any time that day. If you have a wireless device, the transmission will be sent automatically. After your physician reviews your transmission, you will receive a postcard with your next transmission date.  Any Other Special Instructions Will Be Listed Below (If Applicable).  If you need a refill on your cardiac medications before your next appointment, please call your pharmacy.

## 2017-12-25 NOTE — Progress Notes (Signed)
HPI [name] returns today c/o sob. She is a pleasant 72 yo woman with profound sinus node dysfunction, s/p PPM insertion. She has HTN and HTNsive heart disease. She has bronchitic symptoms as well. She denies chest pain. She has peripheral edema and is taking lasix, usually 40 mg daily.  No Known Allergies   Current Outpatient Medications  Medication Sig Dispense Refill  . acetaminophen (TYLENOL) 500 MG tablet Take 500-1,000 mg by mouth every 6 (six) hours as needed for moderate pain.    [name] amiodarone (PACERONE) 200 MG tablet Take amiodarone 200 mg (one tablet) by mouth daily Monday through Saturday.  Do not take amiodarone on Sunday. 90 tablet 3  . apixaban (ELIQUIS) 5 MG TABS tablet Take 1 tablet (5 mg total) by mouth 2 (two) times daily. Take 1st dose 06-07-14 60 tablet 1  . aspirin 81 MG tablet Take 81 mg by mouth daily.     . B Complex-Biotin-FA (B-COMPLEX PO) Take 1 capsule by mouth every morning.     [name] BIOTIN PO Take 1 tablet by mouth daily.    . Cholecalciferol (VITAMIN D3) 1000 UNITS CAPS Take 1,000 Units by mouth daily.    . Coenzyme Q10 (CO Q-10) 200 MG CAPS Take 200 mg by mouth daily.    . diphenhydramine-acetaminophen (TYLENOL PM) 25-500 MG TABS tablet Take 1 tablet by mouth at bedtime as needed (sleep).    . ezetimibe (ZETIA) 10 MG tablet Take 10 mg by mouth daily.    . fenofibrate 160 MG tablet Take 1 tablet (160 mg total) by mouth daily. 90 tablet 1  . furosemide (LASIX) 40 MG tablet Take 40 mg by mouth daily. Can take 1 extra tablet by mouth daily as needed for swelling/edema    . gabapentin (NEURONTIN) 300 MG capsule Take 300 mg by mouth at bedtime.     . GuaiFENesin (TUSSIN PO) Take by mouth 2 (two) times daily as needed (Cough/congestion). Pt unsure of strength    . insulin aspart protamine- aspart (NOVOLOG MIX 70/30) (70-30) 100 UNIT/ML injection Inject 35 Units into the skin 2 (two) times daily with a meal.     . losartan (COZAAR) 50 MG tablet TAKE 1 TABLET  UPTO TWICE A DAY AS DIRECTED 180 tablet 2  . metFORMIN (GLUCOPHAGE) 500 MG tablet Take 500 mg by mouth 2 (two) times daily with a meal.     . metoprolol succinate (TOPROL-XL) 100 MG 24 hr tablet Take 1 tablet daily with or immediatelyfollowing a meal. 30 tablet 0  . NEOMYCIN-POLYMYXIN-HYDROCORTISONE (CORTISPORIN) 1 % SOLN otic solution Place 4 drops into both ears 2 (two) times daily as needed. For pain and itching    . Omega-3 Fatty Acids (FISH OIL PO) Take 2 capsules by mouth 2 (two) times daily.    . potassium chloride (K-DUR,KLOR-CON) 10 MEQ tablet Take 10 mEq by mouth every morning.     . pravastatin (PRAVACHOL) 40 MG tablet Take 1 tablet (40 mg total) by mouth daily. 90 tablet 3  . ezetimibe (ZETIA) 10 MG tablet Take 1 tablet (10 mg total) by mouth daily. 90 tablet 3   No current facility-administered medications for this visit.      Past Medical History:  Diagnosis Date  . Asthmatic bronchitis   . CHF (congestive heart failure) ([place]) dx'd 2016  . Coronary atherosclerosis of native coronary artery    a. s/p CABG 2005 Dr Roxy Manns    . Hyperlipidemia   . Hypertension   .  Hypertensive heart disease   . Kidney stones   . Mixed hyperlipidemia   . Paroxysmal atrial fibrillation ([place])    a. s/p Maze in 2005 b. recurrence in March 2016 with >8sec posttermination pauses s/p PPM implant    . Pneumonia 1950   "double"  . Presence of permanent cardiac pacemaker   . Sleep apnea    "tested; mask ordered; couldn't afford it; will get it now" (09/04/2015)  . TIA (transient ischemic attack)   . Type II diabetes mellitus ([place])   . Uterine cancer [facility])    age 89 with partial hysterectomy    ROS:   All systems reviewed and negative except as noted in the HPI.   Past Surgical History:  Procedure Laterality Date  . CARDIAC CATHETERIZATION  2005  . CORONARY ARTERY BYPASS GRAFT  2005   Dr Roxy Manns with MAZE  . CYSTOSCOPY WITH URETEROSCOPY, STONE BASKETRY AND STENT PLACEMENT Left 09/08/2014    Procedure: CYSTOSCOPY WITH URETEROSCOPY, STONE BASKETRY AND STENT PLACEMENT;  Surgeon: Cleon Gustin, MD;  Location: [facility];  Service: Urology;  Laterality: Left;  . CYSTOSCOPY/URETEROSCOPY/HOLMIUM LASER/STENT PLACEMENT Left 09/15/2014   Procedure: CYSTOSCOPY/RETROGRADE/URETEROSCOPY/HOLMIUM LASER/ STONE EXTRACTION /STENT EXCHANGE;  Surgeon: Cleon Gustin, MD;  [facility]: [facility];  Service: Urology;  Laterality: Left;  . HOLMIUM LASER APPLICATION Left 4/49/6759   Procedure: HOLMIUM LASER APPLICATION;  Surgeon: Cleon Gustin, MD;  Location: [facility];  Service: Urology;  Laterality: Left;  . HOLMIUM LASER APPLICATION Left 03/22/3844   Procedure: HOLMIUM LASER APPLICATION;  Surgeon: Cleon Gustin, MD;  [facility];  Service: Urology;  Laterality: Left;  . INSERT / REPLACE / REMOVE PACEMAKER    . LAPAROSCOPIC CHOLECYSTECTOMY    . 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 VAGINAL HYSTERECTOMY  1975     Family History  Problem Relation Age of Onset  . Hypertension Mother   . Hyperlipidemia Mother   . Heart disease Mother   . Diabetes Father   . Stroke Father   . Heart disease Father   . Hypertension Sister        3 sisters  . Arthritis Brother   . Heart attack Maternal Grandfather   . Stroke Paternal Grandfather   . Hypertension Daughter      Social History   Socioeconomic History  . Marital status: Married    Spouse name: Not on file  . Number of children: Not on file  . Years of education: Not on file  . Highest education level: Not on file  Occupational History  . Not on file  Social Needs  . Financial resource strain: Not on file  . Food insecurity:    Worry: Not on file    Inability: Not on file  . Transportation needs:    Medical: Not on file    Non-medical: Not on file  Tobacco Use  . Smoking status: Never Smoker  . Smokeless tobacco: Never Used  Substance and  Sexual Activity  . Alcohol use: No  . Drug use: No  . Sexual activity: Not on file  Lifestyle  . Physical activity:    Days per week: Not on file    Minutes per session: Not on file  . Stress: Not on file  Relationships  . Social connections:    Talks on phone: Not on file    Gets together: Not on file    Attends religious  service: Not on file    Active member of club or organization: Not on file    Attends meetings of clubs or organizations: Not on file    Relationship status: Not on file  . Intimate partner violence:    Fear of current or ex partner: Not on file    Emotionally abused: Not on file    Physically abused: Not on file    Forced sexual activity: Not on file  Other Topics Concern  . Not on file  Social History Narrative  . Not on file     BP (!) 144/78   Pulse 79   Ht _0  (1.626 m)   Wt 240 lb 6.4 oz (109 kg)   SpO2 98%   BMI 41.26 kg/m   Physical Exam:  Well appearing obese woman, NAD HEENT: Unremarkable Neck:  6 cm JVD, no thyromegally Lymphatics:  No adenopathy Back:  No CVA tenderness Lungs:  Clear with no wheezes HEART:  Regular rate rhythm, no murmurs, no rubs, no clicks Abd:  soft, positive bowel sounds, no organomegally, no rebound, no guarding Ext:  2 plus pulses, no edema, no cyanosis, no clubbing Skin:  No rashes no nodules Neuro:  CN II through XII intact, motor grossly intact  EKG - NSR with atrial pacing  DEVICE  Normal device function.  See PaceArt for details.   Assess/Plan: 1. Diastolic heart failure - review of her echo demonstrates diastolic dysfunction. I have asked that the patient reduce her salt intake, increase lasix to 80 mg daily, start walking every day. 2. Amiodarone - there is a remote chance that she has amio lung tox. I have recommended she undergo PFT's and DLCO and an ESR. 3. HTN - her blood pressure is up a bit. She will continue her current meds except as above.  4. Obesity - I have encouraged the patient to  lose weight.  [name].D.

## 2017-12-26 LAB — SEDIMENTATION RATE: Sed Rate: 16 mm/hr (ref 0–40)

## 2017-12-28 ENCOUNTER — Ambulatory Visit (INDEPENDENT_AMBULATORY_CARE_PROVIDER_SITE_OTHER): Payer: Medicare Other | Admitting: Internal Medicine

## 2017-12-28 DIAGNOSIS — R0600 Dyspnea, unspecified: Secondary | ICD-10-CM

## 2017-12-28 LAB — PULMONARY FUNCTION TEST
DL/VA % PRED: 121 %
DL/VA: 5.73 ml/min/mmHg/L
DLCO UNC: 15.97 ml/min/mmHg
DLCO unc % pred: 67 %
FEF 25-75 Post: 2.93 L/sec
FEF 25-75 Pre: 3 L/sec
FEF2575-%Change-Post: -2 %
FEF2575-%PRED-PRE: 167 %
FEF2575-%Pred-Post: 163 %
FEV1-%CHANGE-POST: 0 %
FEV1-%PRED-PRE: 68 %
FEV1-%Pred-Post: 67 %
FEV1-POST: 1.47 L
FEV1-Pre: 1.49 L
FEV1FVC-%Change-Post: 0 %
FEV1FVC-%PRED-PRE: 119 %
FEV6-%CHANGE-POST: -1 %
FEV6-%PRED-POST: 58 %
FEV6-%Pred-Pre: 59 %
FEV6-Post: 1.61 L
FEV6-Pre: 1.63 L
FEV6FVC-%PRED-POST: 105 %
FEV6FVC-%Pred-Pre: 105 %
FVC-%Change-Post: -1 %
FVC-%PRED-PRE: 56 %
FVC-%Pred-Post: 55 %
FVC-Post: 1.61 L
Post FEV1/FVC ratio: 91 %
Post FEV6/FVC ratio: 100 %
Pre FEV1/FVC ratio: 91 %
Pre FEV6/FVC Ratio: 100 %
RV % PRED: 52 %
RV: 1.17 L
TLC % PRED: 58 %
TLC: 2.93 L

## 2017-12-28 NOTE — Progress Notes (Signed)
PFT completed today. 12/28/17

## 2017-12-30 ENCOUNTER — Encounter: Payer: Self-pay | Admitting: Cardiology

## 2018-01-14 ENCOUNTER — Other Ambulatory Visit: Payer: Self-pay

## 2018-01-14 DIAGNOSIS — R942 Abnormal results of pulmonary function studies: Secondary | ICD-10-CM

## 2018-01-14 DIAGNOSIS — R0600 Dyspnea, unspecified: Secondary | ICD-10-CM

## 2018-01-14 LAB — CUP PACEART REMOTE DEVICE CHECK
Battery Remaining Longevity: 84 mo
Battery Voltage: 3.01 V
Brady Statistic AP VP Percent: 0.19 %
Brady Statistic AS VP Percent: 0 %
Brady Statistic RA Percent Paced: 98.41 %
Brady Statistic RV Percent Paced: 0.2 %
Date Time Interrogation Session: 20191008134329
Implantable Lead Implant Date: 20160321
Implantable Lead Implant Date: 20160321
Implantable Lead Location: 753859
Implantable Lead Model: 5076
Lead Channel Impedance Value: 513 Ohm
Lead Channel Impedance Value: 551 Ohm
Lead Channel Pacing Threshold Amplitude: 0.75 V
Lead Channel Pacing Threshold Pulse Width: 0.4 ms
Lead Channel Sensing Intrinsic Amplitude: 1.125 mV
Lead Channel Sensing Intrinsic Amplitude: 17.25 mV
Lead Channel Setting Pacing Amplitude: 2 V
Lead Channel Setting Sensing Sensitivity: 0.9 mV
MDC IDC LEAD LOCATION: 753860
MDC IDC MSMT LEADCHNL RA IMPEDANCE VALUE: 342 Ohm
MDC IDC MSMT LEADCHNL RA IMPEDANCE VALUE: 456 Ohm
MDC IDC MSMT LEADCHNL RA SENSING INTR AMPL: 1.125 mV
MDC IDC MSMT LEADCHNL RV PACING THRESHOLD AMPLITUDE: 0.625 V
MDC IDC MSMT LEADCHNL RV PACING THRESHOLD PULSEWIDTH: 0.4 ms
MDC IDC MSMT LEADCHNL RV SENSING INTR AMPL: 17.25 mV
MDC IDC PG IMPLANT DT: 20160321
MDC IDC SET LEADCHNL RV PACING AMPLITUDE: 2.5 V
MDC IDC SET LEADCHNL RV PACING PULSEWIDTH: 0.4 ms
MDC IDC STAT BRADY AP VS PERCENT: 98.25 %
MDC IDC STAT BRADY AS VS PERCENT: 1.56 %

## 2018-01-14 NOTE — Progress Notes (Signed)
Referral placed to [facility] pulmonology for abnormal PFT's per Dr. [name].

## 2018-02-04 ENCOUNTER — Ambulatory Visit: Payer: Medicare Other | Admitting: Pulmonary Disease

## 2018-02-04 ENCOUNTER — Encounter: Payer: Self-pay | Admitting: Pulmonary Disease

## 2018-02-04 VITALS — BP 148/66 | HR 102 | Ht 63.5 in | Wt 235.4 lb

## 2018-02-04 DIAGNOSIS — Z951 Presence of aortocoronary bypass graft: Secondary | ICD-10-CM

## 2018-02-04 DIAGNOSIS — G4733 Obstructive sleep apnea (adult) (pediatric): Secondary | ICD-10-CM

## 2018-02-04 DIAGNOSIS — R0602 Shortness of breath: Secondary | ICD-10-CM

## 2018-02-04 DIAGNOSIS — R0609 Other forms of dyspnea: Secondary | ICD-10-CM | POA: Diagnosis not present

## 2018-02-04 DIAGNOSIS — I480 Paroxysmal atrial fibrillation: Secondary | ICD-10-CM

## 2018-02-04 DIAGNOSIS — J849 Interstitial pulmonary disease, unspecified: Secondary | ICD-10-CM

## 2018-02-04 DIAGNOSIS — Z9189 Other specified personal risk factors, not elsewhere classified: Secondary | ICD-10-CM

## 2018-02-04 DIAGNOSIS — Z7901 Long term (current) use of anticoagulants: Secondary | ICD-10-CM

## 2018-02-04 DIAGNOSIS — Z79899 Other long term (current) drug therapy: Secondary | ICD-10-CM

## 2018-02-04 NOTE — Patient Instructions (Addendum)
Thank you for visiting Dr. Valeta Harms at [facility]. Today we recommend the following:  Orders Placed This Encounter  Procedures  . CT CHEST HIGH RESOLUTION   ILD Questionaire to fill out and bring back.   Return in about 4 weeks (around 03/04/2018), or if symptoms worsen or fail to improve.

## 2018-02-04 NOTE — Progress Notes (Signed)
Synopsis: Referred in November 2019 for shortness of breath dyspnea on exertion by [name], DO  Subjective:   PATIENT ID: [name] GENDER: female DOB: [birth year], MRN: [id number]  Chief Complaint  Patient presents with  . Consult    Has been having sob intermittenly x 1 year with cough and thick mucous. No inhalers. Has had PFT recently. Possible fibrosis?     The patient was referred by her PCP due to abnormal PFTs. She has daily cough which has been present in the morning and at night. Currently managed for chronic diastolic heart failure. She watches her weight daily. She occasionally wheezes and feels SOB with exertion. She can only walk to the mail box down hill but gets significantly short of breath with climbing the hill. Long hallways she will have to stop and rest for a minutes before continuing to go forward. Life long non-smoker. She does have OSA and wears her CPAP. Sometimes with significant congestion she cant wear the the mask. She has been on amiodarone for 2-3 years ago. Started out on 2 pills BID and now once daily. She worked in [employer] her whole life. She worked in a Equities trader for 17 years.   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 Asthmatic bronchitis   . CHF (congestive heart failure) ([place]) dx'd 2016  . Coronary atherosclerosis of native coronary artery    a. s/p CABG 2005 Dr Roxy Manns    . Hyperlipidemia   . Hypertension   . Hypertensive heart disease   . Kidney stones   . Mixed hyperlipidemia   . Paroxysmal atrial fibrillation ([place])    a. s/p Maze in 2005 b. recurrence in March 2016 with >8sec posttermination pauses s/p PPM implant    . Pneumonia 1950   "double"  . Presence of permanent cardiac pacemaker   . Sleep apnea    "tested; mask ordered; couldn't afford it; will get it now" (09/04/2015)  . TIA  (transient ischemic attack)   . Type II diabetes mellitus ([place])   . Uterine cancer [facility])    age 9 with partial hysterectomy     Family History  Problem Relation Age of Onset  . Hypertension Mother   . Hyperlipidemia Mother   . Heart disease Mother   . Diabetes Father   . Stroke Father   . Heart disease Father   . Hypertension Sister        3 sisters  . Arthritis Brother   . Heart attack Maternal Grandfather   . Stroke Paternal Grandfather   . Hypertension Daughter      Past Surgical History:  Procedure Laterality Date  . CARDIAC CATHETERIZATION  2005  . CORONARY ARTERY BYPASS GRAFT  2005   Dr Roxy Manns with MAZE  . CYSTOSCOPY WITH URETEROSCOPY, STONE BASKETRY AND STENT PLACEMENT Left 09/08/2014   Procedure: CYSTOSCOPY WITH URETEROSCOPY, STONE BASKETRY AND STENT PLACEMENT;  Surgeon: Cleon Gustin, MD;  [facility];  Service: Urology;  Laterality: Left;  . CYSTOSCOPY/URETEROSCOPY/HOLMIUM LASER/STENT PLACEMENT Left 09/15/2014   Procedure: CYSTOSCOPY/RETROGRADE/URETEROSCOPY/HOLMIUM LASER/ STONE EXTRACTION /STENT EXCHANGE;  Surgeon: Cleon Gustin, MD;  [facility];  Service: Urology;  Laterality: Left;  . HOLMIUM LASER APPLICATION Left 7/84/6962   Procedure: HOLMIUM LASER APPLICATION;  Surgeon: Cleon Gustin, MD;  [facility];  Service: Urology;  Laterality:  Left;  . HOLMIUM LASER APPLICATION Left 07/17/8411   Procedure: HOLMIUM LASER APPLICATION;  Surgeon: Cleon Gustin, MD;  [facility];  Service: Urology;  Laterality: Left;  . INSERT / REPLACE / REMOVE PACEMAKER    . LAPAROSCOPIC CHOLECYSTECTOMY    . 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 VAGINAL HYSTERECTOMY  1975    Social History   Socioeconomic History  . Marital status: Married    Spouse name: Not on file  . Number of children: Not on file  . Years of education: Not on file  . Highest education  level: Not on file  Occupational History  . Not on file  Social Needs  . Financial resource strain: Not on file  . Food insecurity:    Worry: Not on file    Inability: Not on file  . Transportation needs:    Medical: Not on file    Non-medical: Not on file  Tobacco Use  . Smoking status: Never Smoker  . Smokeless tobacco: Never Used  Substance and Sexual Activity  . Alcohol use: No  . Drug use: No  . Sexual activity: Not on file  Lifestyle  . Physical activity:    Days per week: Not on file    Minutes per session: Not on file  . Stress: Not on file  Relationships  . Social connections:    Talks on phone: Not on file    Gets together: Not on file    Attends religious service: Not on file    Active member of club or organization: Not on file    Attends meetings of clubs or organizations: Not on file    Relationship status: Not on file  . Intimate partner violence:    Fear of current or ex partner: Not on file    Emotionally abused: Not on file    Physically abused: Not on file    Forced sexual activity: Not on file  Other Topics Concern  . Not on file  Social History Narrative  . Not on file     No Known Allergies   Outpatient Medications Prior to Visit  Medication Sig Dispense Refill  . acetaminophen (TYLENOL) 500 MG tablet Take 500-1,000 mg by mouth every 6 (six) hours as needed for moderate pain.    [name] amiodarone (PACERONE) 200 MG tablet Take one tablet by mouth Monday through Friday.  Do NOT take on Saturday or Sunday. 90 tablet 3  . apixaban (ELIQUIS) 5 MG TABS tablet Take 1 tablet (5 mg total) by mouth 2 (two) times daily. Take 1st dose 06-07-14 60 tablet 1  . aspirin 81 MG tablet Take 81 mg by mouth daily.     . B Complex-Biotin-FA (B-COMPLEX PO) Take 1 capsule by mouth every morning.     [name] BIOTIN PO Take 1 tablet by mouth daily.    . Cholecalciferol (VITAMIN D3) 1000 UNITS CAPS Take 1,000 Units by mouth daily.    . Coenzyme Q10 (CO Q-10) 200 MG CAPS Take 200  mg by mouth daily.    . diphenhydramine-acetaminophen (TYLENOL PM) 25-500 MG TABS tablet Take 1 tablet by mouth at bedtime as needed (sleep).    . ezetimibe (ZETIA) 10 MG tablet Take 10 mg by mouth daily.    . fenofibrate 160 MG tablet Take 1 tablet (160 mg total) by mouth daily. 90 tablet 1  . furosemide (LASIX) 40 MG tablet Take  2 tablets (80 mg total) by mouth daily. 180 tablet 3  . gabapentin (NEURONTIN) 300 MG capsule Take 300 mg by mouth at bedtime.     . GuaiFENesin (TUSSIN PO) Take by mouth 2 (two) times daily as needed (Cough/congestion). Pt unsure of strength    . insulin aspart protamine- aspart (NOVOLOG MIX 70/30) (70-30) 100 UNIT/ML injection Inject 35 Units into the skin 2 (two) times daily with a meal.     . losartan (COZAAR) 50 MG tablet TAKE 1 TABLET UPTO TWICE A DAY AS DIRECTED (Patient taking differently: Take 50 mg by mouth daily. TAKE 1 TABLET UPTO TWICE A DAY AS DIRECTED) 180 tablet 2  . metFORMIN (GLUCOPHAGE) 500 MG tablet Take 500 mg by mouth 2 (two) times daily with a meal.     . metoprolol succinate (TOPROL-XL) 100 MG 24 hr tablet Take 1 tablet daily with or immediatelyfollowing a meal. 30 tablet 0  . NEOMYCIN-POLYMYXIN-HYDROCORTISONE (CORTISPORIN) 1 % SOLN otic solution Place 4 drops into both ears 2 (two) times daily as needed. For pain and itching    . Omega-3 Fatty Acids (FISH OIL PO) Take 2 capsules by mouth 2 (two) times daily.    . potassium chloride (K-DUR,KLOR-CON) 10 MEQ tablet Take 10 mEq by mouth every morning.     . pravastatin (PRAVACHOL) 40 MG tablet Take 1 tablet (40 mg total) by mouth daily. 90 tablet 3  . ezetimibe (ZETIA) 10 MG tablet Take 1 tablet (10 mg total) by mouth daily. 90 tablet 3   No facility-administered medications prior to visit.     Review of Systems  Constitutional: Negative.   HENT: Positive for congestion, sinus pain and sore throat. Negative for ear discharge, ear pain, hearing loss, nosebleeds and tinnitus.   Eyes: Negative.     Respiratory: Positive for cough, sputum production, shortness of breath and wheezing. Negative for hemoptysis and stridor.   Cardiovascular: Positive for leg swelling. Negative for chest pain, palpitations, orthopnea, claudication and PND.  Gastrointestinal: Negative.   Genitourinary: Negative.   Musculoskeletal: Negative.   Skin: Negative.   Neurological: Negative.   Endo/Heme/Allergies: Negative.   Psychiatric/Behavioral: Negative.      Objective:  Physical Exam  Constitutional: She is oriented to person, place, and time. She appears well-developed and well-nourished. No distress.  Obese   HENT:  Head: Normocephalic and atraumatic.  Mouth/Throat: Oropharynx is clear and moist.  Eyes: Pupils are equal, round, and reactive to light. Conjunctivae are normal. No scleral icterus.  Neck: Neck supple. No JVD present. No tracheal deviation present.  Large neck    Cardiovascular: Normal rate, regular rhythm, normal heart sounds and intact distal pulses.  No murmur heard. Pulmonary/Chest: Effort normal and breath sounds normal. No accessory muscle usage or stridor. No tachypnea. No respiratory distress. She has no wheezes. She has no rhonchi. She has no rales.  Abdominal: Soft. Bowel sounds are normal. She exhibits no distension. There is no tenderness.  Obese pannus   Musculoskeletal: She exhibits no edema or tenderness.  Lymphadenopathy:    She has no cervical adenopathy.  Neurological: She is alert and oriented to person, place, and time.  Skin: Skin is warm and dry. Capillary refill takes less than 2 seconds. No rash noted.  Psychiatric: She has a normal mood and affect. Her behavior is normal.  Vitals reviewed.    Vitals:   02/04/18 0903  BP: (!) 148/66  Pulse: (!) 102  SpO2: 92%  Weight: 235 lb 6.4 oz (106.8 kg)  Height: 5' 3.5" (1.613 m)   92% on RA BMI Readings from Last 3 Encounters:  02/04/18 41.05 kg/m  12/25/17 41.26 kg/m  11/19/17 41.47 kg/m   Wt  Readings from Last 3 Encounters:  02/04/18 235 lb 6.4 oz (106.8 kg)  12/25/17 240 lb 6.4 oz (109 kg)  11/19/17 241 lb 9.6 oz (109.6 kg)    CBC    Component Value Date/Time   WBC 8.0 04/05/2017 0138   RBC 3.90 04/05/2017 0138   HGB 12.0 04/05/2017 0138   HCT 35.8 (L) 04/05/2017 0138   PLT 279 04/05/2017 0138   MCV 91.8 04/05/2017 0138   MCH 30.8 04/05/2017 0138   MCHC 33.5 04/05/2017 0138   RDW 15.9 (H) 04/05/2017 0138   LYMPHSABS 2.7 04/05/2017 0138   MONOABS 0.5 04/05/2017 0138   EOSABS 0.2 04/05/2017 0138   BASOSABS 0.0 04/05/2017 0138   Chest Imaging: Chest x-ray 2017: Enlarged cardiac silhouette bilateral pulmonary edema The patient's images have been independently reviewed by me.    No axial CT imaging    Pulmonary Functions Testing Results: PFT Results Latest Ref Rng & Units 12/28/2017  FVC-Predicted Pre % 56  FVC-Post L 1.61  FVC-Predicted Post % 55  Pre FEV1/FVC % % 91  Post FEV1/FCV % % 91  FEV1-Pre L 1.49  FEV1-Predicted Pre % 68  FEV1-Post L 1.47  DLCO UNC% % 67  DLCO COR %Predicted % 121  TLC L 2.93  TLC % Predicted % 58  RV % Predicted % 52    FeNO: No results found for: NITRICOXIDE  Pathology: None   Echocardiogram: None   Heart Catheterization: None     Assessment & Plan:   DOE (dyspnea on exertion)  SOB (shortness of breath)  Morbid obesity (HCC)  Hx of CABG 2005  Chronic anticoagulation  Obstructive sleep apnea syndrome  Paroxysmal atrial fibrillation (HCC)  Interstitial pulmonary disease (HCC) - Plan: CT CHEST HIGH RESOLUTION  At risk for amiodarone toxicity with long term use  Discussion:  This is a 72 year old female with approximately 2 to 3-year history use of amiodarone.  Now presenting with significant dyspnea on exertion.  She has multiple reasons for her dyspnea as listed above.  I do believe that physical deconditioning and obesity is a component of this.  Her PFTs do reveal evidence of restriction with a TLC of  58% and a DLCO in the 60s.  Additionally, the patient has a significant occupational history as she has been exposed to dust, lint and fabric fibers for many years.  She worked in a Equities trader for 17 years.  I believe the next best step is to obtain axial CT imaging.  I have ordered an HRCT of the chest to include supine and prone imaging.  Pending on the axial CT of the chest will make further recommendations on evidence of ILD due to amiodarone toxicity.  There is little role for bronchoscopy and amiodarone toxicity diagnosis.  BAL will likely show lipid-laden macrophages which is only consistent with use of amiodarone.  Therefore, pending results of the CT will make recommendations on continuation of amiodarone and discussed with patient's EP and primary cardiologist.  Also discussed weight loss and daily exercise as this is important to maintain endurance.  Give the patient our ILD questionnaire to be completed and will review on her next office visit.  Return to clinic in 4 weeks following HRCT of the chest.  Greater than 50% of this patient 60-minute office visit was spent  face-to-face the above diagnostic recommendations for HRCT.  Also reviewing patient's pulmonary function tests face-to-face with the patient and family.   Current Outpatient Medications:  .  acetaminophen (TYLENOL) 500 MG tablet, Take 500-1,000 mg by mouth every 6 (six) hours as needed for moderate pain., Disp: , Rfl:  .  amiodarone (PACERONE) 200 MG tablet, Take one tablet by mouth Monday through Friday.  Do NOT take on Saturday or Sunday., Disp: 90 tablet, Rfl: 3 .  apixaban (ELIQUIS) 5 MG TABS tablet, Take 1 tablet (5 mg total) by mouth 2 (two) times daily. Take 1st dose 06-07-14, Disp: 60 tablet, Rfl: 1 .  aspirin 81 MG tablet, Take 81 mg by mouth daily. , Disp: , Rfl:  .  B Complex-Biotin-FA (B-COMPLEX PO), Take 1 capsule by mouth every morning. , Disp: , Rfl:  .  BIOTIN PO, Take 1 tablet by mouth daily., Disp: , Rfl:   .  Cholecalciferol (VITAMIN D3) 1000 UNITS CAPS, Take 1,000 Units by mouth daily., Disp: , Rfl:  .  Coenzyme Q10 (CO Q-10) 200 MG CAPS, Take 200 mg by mouth daily., Disp: , Rfl:  .  diphenhydramine-acetaminophen (TYLENOL PM) 25-500 MG TABS tablet, Take 1 tablet by mouth at bedtime as needed (sleep)., Disp: , Rfl:  .  ezetimibe (ZETIA) 10 MG tablet, Take 10 mg by mouth daily., Disp: , Rfl:  .  fenofibrate 160 MG tablet, Take 1 tablet (160 mg total) by mouth daily., Disp: 90 tablet, Rfl: 1 .  furosemide (LASIX) 40 MG tablet, Take 2 tablets (80 mg total) by mouth daily., Disp: 180 tablet, Rfl: 3 .  gabapentin (NEURONTIN) 300 MG capsule, Take 300 mg by mouth at bedtime. , Disp: , Rfl:  .  GuaiFENesin (TUSSIN PO), Take by mouth 2 (two) times daily as needed (Cough/congestion). Pt unsure of strength, Disp: , Rfl:  .  insulin aspart protamine- aspart (NOVOLOG MIX 70/30) (70-30) 100 UNIT/ML injection, Inject 35 Units into the skin 2 (two) times daily with a meal. , Disp: , Rfl:  .  losartan (COZAAR) 50 MG tablet, TAKE 1 TABLET UPTO TWICE A DAY AS DIRECTED (Patient taking differently: Take 50 mg by mouth daily. TAKE 1 TABLET UPTO TWICE A DAY AS DIRECTED), Disp: 180 tablet, Rfl: 2 .  metFORMIN (GLUCOPHAGE) 500 MG tablet, Take 500 mg by mouth 2 (two) times daily with a meal. , Disp: , Rfl:  .  metoprolol succinate (TOPROL-XL) 100 MG 24 hr tablet, Take 1 tablet daily with or immediatelyfollowing a meal., Disp: 30 tablet, Rfl: 0 .  NEOMYCIN-POLYMYXIN-HYDROCORTISONE (CORTISPORIN) 1 % SOLN otic solution, Place 4 drops into both ears 2 (two) times daily as needed. For pain and itching, Disp: , Rfl:  .  Omega-3 Fatty Acids (FISH OIL PO), Take 2 capsules by mouth 2 (two) times daily., Disp: , Rfl:  .  potassium chloride (K-DUR,KLOR-CON) 10 MEQ tablet, Take 10 mEq by mouth every morning. , Disp: , Rfl:  .  pravastatin (PRAVACHOL) 40 MG tablet, Take 1 tablet (40 mg total) by mouth daily., Disp: 90 tablet, Rfl:  3   Garner Nash, DO [facility] 02/04/2018 9:37 AM

## 2018-02-18 ENCOUNTER — Ambulatory Visit (INDEPENDENT_AMBULATORY_CARE_PROVIDER_SITE_OTHER)
Admission: RE | Admit: 2018-02-18 | Discharge: 2018-02-18 | Disposition: A | Discharge status: Home / Self Care | Payer: Medicare Other | Source: Ambulatory Visit | Attending: Pulmonary Disease | Admitting: Pulmonary Disease

## 2018-02-18 DIAGNOSIS — J849 Interstitial pulmonary disease, unspecified: Secondary | ICD-10-CM | POA: Diagnosis not present

## 2018-03-03 ENCOUNTER — Ambulatory Visit: Payer: Medicare Other | Admitting: Pulmonary Disease

## 2018-03-03 ENCOUNTER — Encounter: Payer: Self-pay | Admitting: Pulmonary Disease

## 2018-03-03 VITALS — BP 124/70 | HR 79 | Ht 63.5 in | Wt 231.4 lb

## 2018-03-03 DIAGNOSIS — J984 Other disorders of lung: Secondary | ICD-10-CM | POA: Insufficient documentation

## 2018-03-03 DIAGNOSIS — T462X5A Adverse effect of other antidysrhythmic drugs, initial encounter: Secondary | ICD-10-CM

## 2018-03-03 DIAGNOSIS — J849 Interstitial pulmonary disease, unspecified: Secondary | ICD-10-CM

## 2018-03-03 HISTORY — DX: Other disorders of lung: J98.4

## 2018-03-03 NOTE — Progress Notes (Signed)
Synopsis: Referred in November 2019 for shortness of breath dyspnea on exertion by Dana Graves, DO  Subjective:   PATIENT ID: [name] GENDER: female DOB: [birth year], MRN: [id number]  Chief Complaint  Patient presents with  . Follow-up    Here to discuss C results. States her SOB has improved just a little and her cough has improved.     The patient was referred by her PCP due to abnormal PFTs. She has daily cough which has been present in the morning and at night. Currently managed for chronic diastolic heart failure. She watches her weight daily. She occasionally wheezes and feels SOB with exertion. She can only walk to the mail box down hill but gets significantly short of breath with climbing the hill. Long hallways she will have to stop and rest for a minutes before continuing to go forward. Life long non-smoker. She does have OSA and wears her CPAP. Sometimes with significant congestion she cant wear the the mask. She has been on amiodarone for 2-3 years ago. Started out on 2 pills BID and now once daily. She worked in [employer] her whole life. She worked in a Equities trader for 17 years.   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  Overall her shortness of breath is currently at her baseline.  She does not have any new cough.  Denies hemoptysis.  Has been taking her amiodarone regularly.  Has not followed up yet with her cardiologist.  Patient denies shortness of breath at rest but does have some dyspnea with exertion or climbing stairs.  She has noticed some swelling in her lower extremities.  This is better when she takes her Lasix routinely.  She is compliant with her CPAP on occasion.  Usually gets a few hours per night.    Past Medical History:  Diagnosis Date  . Asthmatic bronchitis   . CHF (congestive heart  failure) ([place]) dx'd 2016  . Coronary atherosclerosis of native coronary artery    a. s/p CABG 2005 Dr Roxy Manns    . Hyperlipidemia   . Hypertension   . Hypertensive heart disease   . Kidney stones   . Mixed hyperlipidemia   . Paroxysmal atrial fibrillation ([place])    a. s/p Maze in 2005 b. recurrence in March 2016 with >8sec posttermination pauses s/p PPM implant    . Pneumonia 1950   "double"  . Presence of permanent cardiac pacemaker   . Sleep apnea    "tested; mask ordered; couldn't afford it; will get it now" (09/04/2015)  . TIA (transient ischemic attack)   . Type II diabetes mellitus ([place])   . Uterine cancer [facility])    age 72 with partial hysterectomy     Family History  Problem Relation Age of Onset  . Hypertension Mother   . Hyperlipidemia Mother   . Heart disease Mother   . Diabetes Father   . Stroke Father   . Heart disease Father   . Hypertension Sister        3 sisters  . Arthritis Brother   . Heart attack Maternal Grandfather   . Stroke Paternal Grandfather   . Hypertension Daughter      Past Surgical History:  Procedure Laterality Date  . CARDIAC CATHETERIZATION  2005  . CORONARY ARTERY BYPASS GRAFT  2005   Dr Roxy Manns with MAZE  .  CYSTOSCOPY WITH URETEROSCOPY, STONE BASKETRY AND STENT PLACEMENT Left 09/08/2014   Procedure: CYSTOSCOPY WITH URETEROSCOPY, STONE BASKETRY AND STENT PLACEMENT;  Surgeon: Cleon Gustin, MD;  Location: [facility];  Service: Urology;  Laterality: Left;  . CYSTOSCOPY/URETEROSCOPY/HOLMIUM LASER/STENT PLACEMENT Left 09/15/2014   Procedure: CYSTOSCOPY/RETROGRADE/URETEROSCOPY/HOLMIUM LASER/ STONE EXTRACTION /STENT EXCHANGE;  Surgeon: Cleon Gustin, MD;  Location: [facility];  Service: Urology;  Laterality: Left;  . HOLMIUM LASER APPLICATION Left 11/13/5619   Procedure: HOLMIUM LASER APPLICATION;  Surgeon: Cleon Gustin, MD;  Location: [facility];  Service: Urology;  Laterality: Left;  . HOLMIUM LASER APPLICATION Left 3/0/8657   Procedure: HOLMIUM  LASER APPLICATION;  Surgeon: Cleon Gustin, MD;  Location: [facility];  Service: Urology;  Laterality: Left;  . INSERT / REPLACE / REMOVE PACEMAKER    . LAPAROSCOPIC CHOLECYSTECTOMY    . 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 VAGINAL HYSTERECTOMY  1975    Social History   Socioeconomic History  . Marital status: Married    Spouse name: Not on file  . Number of children: Not on file  . Years of education: Not on file  . Highest education level: Not on file  Occupational History  . Not on file  Social Needs  . Financial resource strain: Not on file  . Food insecurity:    Worry: Not on file    Inability: Not on file  . Transportation needs:    Medical: Not on file    Non-medical: Not on file  Tobacco Use  . Smoking status: Never Smoker  . Smokeless tobacco: Never Used  Substance and Sexual Activity  . Alcohol use: No  . Drug use: No  . Sexual activity: Not on file  Lifestyle  . Physical activity:    Days per week: Not on file    Minutes per session: Not on file  . Stress: Not on file  Relationships  . Social connections:    Talks on phone: Not on file    Gets together: Not on file    Attends religious service: Not on file    Active member of club or organization: Not on file    Attends meetings of clubs or organizations: Not on file    Relationship status: Not on file  . Intimate partner violence:    Fear of current or ex partner: Not on file    Emotionally abused: Not on file    Physically abused: Not on file    Forced sexual activity: Not on file  Other Topics Concern  . Not on file  Social History Narrative  . Not on file     No Known Allergies   Outpatient Medications Prior to Visit  Medication Sig Dispense Refill  . acetaminophen (TYLENOL) 500 MG tablet Take 500-1,000 mg by mouth every 6 (six) hours as needed for moderate pain.    [name] amiodarone (PACERONE) 200 MG  tablet Take one tablet by mouth Monday through Friday.  Do NOT take on Saturday or Sunday. 90 tablet 3  . apixaban (ELIQUIS) 5 MG TABS tablet Take 1 tablet (5 mg total) by mouth 2 (two) times daily. Take 1st dose 06-07-14 60 tablet 1  . aspirin 81 MG tablet Take 81 mg by mouth daily.     . B Complex-Biotin-FA (B-COMPLEX PO) Take 1 capsule by mouth every morning.     [name] BIOTIN PO Take 1 tablet by mouth daily.    [name]  Cholecalciferol (VITAMIN D3) 1000 UNITS CAPS Take 1,000 Units by mouth daily.    . Coenzyme Q10 (CO Q-10) 200 MG CAPS Take 200 mg by mouth daily.    . diphenhydramine-acetaminophen (TYLENOL PM) 25-500 MG TABS tablet Take 1 tablet by mouth at bedtime as needed (sleep).    . ezetimibe (ZETIA) 10 MG tablet Take 10 mg by mouth daily.    . fenofibrate 160 MG tablet Take 1 tablet (160 mg total) by mouth daily. 90 tablet 1  . furosemide (LASIX) 40 MG tablet Take 2 tablets (80 mg total) by mouth daily. 180 tablet 3  . gabapentin (NEURONTIN) 300 MG capsule Take 300 mg by mouth at bedtime.     . GuaiFENesin (TUSSIN PO) Take by mouth 2 (two) times daily as needed (Cough/congestion). Pt unsure of strength    . insulin aspart protamine- aspart (NOVOLOG MIX 70/30) (70-30) 100 UNIT/ML injection Inject 35 Units into the skin 2 (two) times daily with a meal.     . losartan (COZAAR) 50 MG tablet TAKE 1 TABLET UPTO TWICE A DAY AS DIRECTED (Patient taking differently: Take 50 mg by mouth. TAKE 1 TABLET UPTO TWICE A DAY AS DIRECTED) 180 tablet 2  . metFORMIN (GLUCOPHAGE) 500 MG tablet Take 500 mg by mouth 2 (two) times daily with a meal.     . metoprolol succinate (TOPROL-XL) 100 MG 24 hr tablet Take 1 tablet daily with or immediatelyfollowing a meal. 30 tablet 0  . NEOMYCIN-POLYMYXIN-HYDROCORTISONE (CORTISPORIN) 1 % SOLN otic solution Place 4 drops into both ears 2 (two) times daily as needed. For pain and itching    . Omega-3 Fatty Acids (FISH OIL PO) Take 2 capsules by mouth 2 (two) times daily.    .  potassium chloride (K-DUR,KLOR-CON) 10 MEQ tablet Take 10 mEq by mouth every morning.     . pravastatin (PRAVACHOL) 40 MG tablet Take 1 tablet (40 mg total) by mouth daily. 90 tablet 3   No facility-administered medications prior to visit.     Review of Systems  Constitutional: Negative for chills, fever, malaise/fatigue and weight loss.  HENT: Negative for hearing loss, sore throat and tinnitus.   Eyes: Negative for blurred vision and double vision.  Respiratory: Positive for shortness of breath. Negative for cough, hemoptysis, sputum production, wheezing and stridor.   Cardiovascular: Negative for chest pain, palpitations, orthopnea, leg swelling and PND.  Gastrointestinal: Negative for abdominal pain, constipation, diarrhea, heartburn, nausea and vomiting.  Genitourinary: Negative for dysuria, hematuria and urgency.  Musculoskeletal: Negative for joint pain and myalgias.  Skin: Negative for itching and rash.  Neurological: Negative for dizziness, tingling, weakness and headaches.  Endo/Heme/Allergies: Negative for environmental allergies. Does not bruise/bleed easily.  Psychiatric/Behavioral: Negative for depression. The patient is not nervous/anxious and does not have insomnia.   All other systems reviewed and are negative.    Objective:  Physical Exam Vitals signs reviewed.  Constitutional:      General: She is not in acute distress.    Appearance: She is well-developed.  HENT:     Head: Normocephalic and atraumatic.  Eyes:     General: No scleral icterus.    Conjunctiva/sclera: Conjunctivae normal.     Pupils: Pupils are equal, round, and reactive to light.  Neck:     Musculoskeletal: Neck supple.     Vascular: No JVD.     Trachea: No tracheal deviation.  Cardiovascular:     Rate and Rhythm: Normal rate and regular rhythm.     Heart  sounds: Normal heart sounds. No murmur.  Pulmonary:     Effort: Pulmonary effort is normal. No tachypnea, accessory muscle usage or  respiratory distress.     Breath sounds: No stridor. No wheezing, rhonchi or rales.     Comments: Bilateral bronchial breath sounds, no crackles Abdominal:     General: Bowel sounds are normal. There is no distension.     Palpations: Abdomen is soft.     Tenderness: There is no abdominal tenderness.  Musculoskeletal:        General: No tenderness.  Lymphadenopathy:     Cervical: No cervical adenopathy.  Skin:    General: Skin is warm and dry.     Capillary Refill: Capillary refill takes less than 2 seconds.     Findings: No rash.  Neurological:     Mental Status: She is alert and oriented to person, place, and time.  Psychiatric:        Behavior: Behavior normal.      Vitals:   03/03/18 1319  BP: 124/70  Pulse: 79  SpO2: 98%  Weight: 231 lb 6.4 oz (105 kg)  Height: 5' 3.5" (1.613 m)   98% on RA BMI Readings from Last 3 Encounters:  03/03/18 40.35 kg/m  02/04/18 41.05 kg/m  12/25/17 41.26 kg/m   Wt Readings from Last 3 Encounters:  03/03/18 231 lb 6.4 oz (105 kg)  02/04/18 235 lb 6.4 oz (106.8 kg)  12/25/17 240 lb 6.4 oz (109 kg)    CBC    Component Value Date/Time   WBC 8.0 04/05/2017 0138   RBC 3.90 04/05/2017 0138   HGB 12.0 04/05/2017 0138   HCT 35.8 (L) 04/05/2017 0138   PLT 279 04/05/2017 0138   MCV 91.8 04/05/2017 0138   MCH 30.8 04/05/2017 0138   MCHC 33.5 04/05/2017 0138   RDW 15.9 (H) 04/05/2017 0138   LYMPHSABS 2.7 04/05/2017 0138   MONOABS 0.5 04/05/2017 0138   EOSABS 0.2 04/05/2017 0138   BASOSABS 0.0 04/05/2017 0138   Chest Imaging: Chest x-ray 2017: Enlarged cardiac silhouette bilateral pulmonary edema The patient's images have been independently reviewed by me.    High-resolution CT of the chest December 2019: Patient with bilateral fibrotic interstitial lung disease, patchy groundglass opacities and reticulation without a clear apical basilar gradient.  Findings are new compared to prior CT imaging.  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 The patient's images have been independently reviewed by me.     Pulmonary Functions Testing Results: PFT Results Latest Ref Rng & Units 12/28/2017  FVC-Predicted Pre % 56  FVC-Post L 1.61  FVC-Predicted Post % 55  Pre FEV1/FVC % % 91  Post FEV1/FCV % % 91  FEV1-Pre L 1.49  FEV1-Predicted Pre % 68  FEV1-Post L 1.47  DLCO UNC% % 67  DLCO COR %Predicted % 121  TLC L 2.93  TLC % Predicted % 58  RV % Predicted % 52    FeNO: No results found for: NITRICOXIDE  Pathology: None   Echocardiogram: None   Heart Catheterization: None     Assessment & Plan:   ILD (interstitial lung disease) ([place]) - Plan: Pulmonary function test  Amiodarone pulmonary toxicity  Restrictive lung disease  Discussion:  This is a 72 year old female with a longstanding history of use of amiodarone for her atrial fibrillation.  Additionally she has prior occupational exposure to dust as well as cotton and lint fibers.  She worked in a Equities trader for approximately 17 years.  She presents  to our clinic with restrictive lung disease as well as CT imaging consistent with interstitial lung disease.  Her imaging would be consistent with a drug-induced ILD such as amiodarone induced pulmonary toxicity.  I believe that we should mitigate the risk of any additional damage to her lungs at this time.  I have discussed the importance of her reaching out to her cardiologist about stopping the amiodarone and transitioning to a different medication for control of her atrial fibrillation.  I will also forward this office note to Dr. [name].  She is currently not having significant respiratory symptoms or undergoing an exacerbation that would require steroid therapy.  I believe that bronchoscopy would aid little to the diagnosis at this time.  BAL would likely show lipid-laden macrophages which would only be consistent with prior use of amiodarone.  She would also not be a good candidate for  surgical lung biopsy.  The most conservative approach would be to discontinue amiodarone and follow her clinically.  We will repeat pulmonary function tests on her next office visit.  Return to clinic in 6 months with full PFTs.  Greater than 50% of this patient's 40-minute office visit was spent face-to-face counseling and discussing the above recommendations and treatment plan as well as the decision to follow her symptoms clinically.   Current Outpatient Medications:  .  acetaminophen (TYLENOL) 500 MG tablet, Take 500-1,000 mg by mouth every 6 (six) hours as needed for moderate pain., Disp: , Rfl:  .  amiodarone (PACERONE) 200 MG tablet, Take one tablet by mouth Monday through Friday.  Do NOT take on Saturday or Sunday., Disp: 90 tablet, Rfl: 3 .  apixaban (ELIQUIS) 5 MG TABS tablet, Take 1 tablet (5 mg total) by mouth 2 (two) times daily. Take 1st dose 06-07-14, Disp: 60 tablet, Rfl: 1 .  aspirin 81 MG tablet, Take 81 mg by mouth daily. , Disp: , Rfl:  .  B Complex-Biotin-FA (B-COMPLEX PO), Take 1 capsule by mouth every morning. , Disp: , Rfl:  .  BIOTIN PO, Take 1 tablet by mouth daily., Disp: , Rfl:  .  Cholecalciferol (VITAMIN D3) 1000 UNITS CAPS, Take 1,000 Units by mouth daily., Disp: , Rfl:  .  Coenzyme Q10 (CO Q-10) 200 MG CAPS, Take 200 mg by mouth daily., Disp: , Rfl:  .  diphenhydramine-acetaminophen (TYLENOL PM) 25-500 MG TABS tablet, Take 1 tablet by mouth at bedtime as needed (sleep)., Disp: , Rfl:  .  ezetimibe (ZETIA) 10 MG tablet, Take 10 mg by mouth daily., Disp: , Rfl:  .  fenofibrate 160 MG tablet, Take 1 tablet (160 mg total) by mouth daily., Disp: 90 tablet, Rfl: 1 .  furosemide (LASIX) 40 MG tablet, Take 2 tablets (80 mg total) by mouth daily., Disp: 180 tablet, Rfl: 3 .  gabapentin (NEURONTIN) 300 MG capsule, Take 300 mg by mouth at bedtime. , Disp: , Rfl:  .  GuaiFENesin (TUSSIN PO), Take by mouth 2 (two) times daily as needed (Cough/congestion). Pt unsure of strength,  Disp: , Rfl:  .  insulin aspart protamine- aspart (NOVOLOG MIX 70/30) (70-30) 100 UNIT/ML injection, Inject 35 Units into the skin 2 (two) times daily with a meal. , Disp: , Rfl:  .  losartan (COZAAR) 50 MG tablet, TAKE 1 TABLET UPTO TWICE A DAY AS DIRECTED (Patient taking differently: Take 50 mg by mouth. TAKE 1 TABLET UPTO TWICE A DAY AS DIRECTED), Disp: 180 tablet, Rfl: 2 .  metFORMIN (GLUCOPHAGE) 500 MG tablet, Take 500 mg  by mouth 2 (two) times daily with a meal. , Disp: , Rfl:  .  metoprolol succinate (TOPROL-XL) 100 MG 24 hr tablet, Take 1 tablet daily with or immediatelyfollowing a meal., Disp: 30 tablet, Rfl: 0 .  NEOMYCIN-POLYMYXIN-HYDROCORTISONE (CORTISPORIN) 1 % SOLN otic solution, Place 4 drops into both ears 2 (two) times daily as needed. For pain and itching, Disp: , Rfl:  .  Omega-3 Fatty Acids (FISH OIL PO), Take 2 capsules by mouth 2 (two) times daily., Disp: , Rfl:  .  potassium chloride (K-DUR,KLOR-CON) 10 MEQ tablet, Take 10 mEq by mouth every morning. , Disp: , Rfl:  .  pravastatin (PRAVACHOL) 40 MG tablet, Take 1 tablet (40 mg total) by mouth daily., Disp: 90 tablet, Rfl: 3   Garner Nash, DO [facility] 03/03/2018 1:37 PM

## 2018-03-03 NOTE — Patient Instructions (Addendum)
Thank you for visiting Dr. Valeta Harms at [facility]. Today we recommend the following: Orders Placed This Encounter  Procedures  . Pulmonary function test   Please return in 6 months with PFT.   Return in about 6 months (around 09/02/2018).

## 2018-03-06 ENCOUNTER — Other Ambulatory Visit: Payer: Self-pay | Admitting: Cardiovascular Disease

## 2018-03-08 ENCOUNTER — Telehealth: Payer: Self-pay | Admitting: Internal Medicine

## 2018-03-08 NOTE — Telephone Encounter (Signed)
New message   Pt c/o medication issue:  1. Name of Medication: amiodarone (PACERONE) 200 MG tablet   2. How are you currently taking this medication (dosage and times per day)?1 time daily   3. Are you having a reaction (difficulty breathing--STAT)? No   4. What is your medication issue? Patient's daughter states that this medication is causing the problem with her lungs. Please call to discuss.

## 2018-03-08 NOTE — Telephone Encounter (Signed)
Returned call to Pt.  Advised would discuss with GT after his return from vacation and would call Pt back.  Pt indicates understanding.

## 2018-03-16 NOTE — Telephone Encounter (Signed)
Per Dr. [name]- have Pt discontinue amiodarone and follow up with him in 3-4 months.  Notified Pt.  Follow up appt made for June 15, 2018 at 2:00 pm.  Advised to call office if Pt has breakthrough arrhythmias prior to appt.  Pt and daughter indicate understanding.

## 2018-03-19 LAB — LIPID PANEL
Chol/HDL Ratio: 3.9 ratio (ref 0.0–4.4)
Cholesterol, Total: 188 mg/dL (ref 100–199)
HDL: 48 mg/dL (ref >39)
LDL Calculated: 94 mg/dL (ref 0–99)
Triglycerides: 228 mg/dL — ABNORMAL HIGH (ref 0–149)
VLDL CHOLESTEROL CAL: 46 mg/dL — AB (ref 5–40)

## 2018-03-19 LAB — COMPREHENSIVE METABOLIC PANEL
A/G RATIO: 1.8 (ref 1.2–2.2)
ALBUMIN: 4.5 g/dL (ref 3.5–4.8)
ALT: 23 IU/L (ref 0–32)
AST: 40 IU/L (ref 0–40)
Alkaline Phosphatase: 50 IU/L (ref 39–117)
BILIRUBIN TOTAL: 0.3 mg/dL (ref 0.0–1.2)
BUN / CREAT RATIO: 25 (ref 12–28)
BUN: 22 mg/dL (ref 8–27)
CO2: 26 mmol/L (ref 20–29)
Calcium: 9.7 mg/dL (ref 8.7–10.3)
Chloride: 98 mmol/L (ref 96–106)
Creatinine, Ser: 0.88 mg/dL (ref 0.57–1.00)
GFR calc non Af Amer: 66 mL/min/{1.73_m2} (ref >59)
GFR, EST AFRICAN AMERICAN: 76 mL/min/{1.73_m2} (ref >59)
GLOBULIN, TOTAL: 2.5 g/dL (ref 1.5–4.5)
Glucose: 204 mg/dL — ABNORMAL HIGH (ref 65–99)
POTASSIUM: 4.8 mmol/L (ref 3.5–5.2)
SODIUM: 139 mmol/L (ref 134–144)
TOTAL PROTEIN: 7 g/dL (ref 6.0–8.5)

## 2018-03-22 ENCOUNTER — Telehealth: Payer: Self-pay

## 2018-03-22 MED ORDER — ICOSAPENT ETHYL 1 G PO CAPS: 2.0000 g | ORAL_CAPSULE | Freq: Two times a day (BID) | ORAL | 1 refills | Status: DC

## 2018-03-22 NOTE — Addendum Note (Signed)
Addended by: Caprice Beaver T on: 03/22/2018 11:56 AM   Modules accepted: Orders

## 2018-03-22 NOTE — Telephone Encounter (Signed)
Gave patient lab results. Patient verbalized understanding.

## 2018-03-24 ENCOUNTER — Encounter: Payer: Self-pay | Admitting: Physician Assistant

## 2018-03-24 ENCOUNTER — Ambulatory Visit: Payer: Medicare Other | Admitting: Physician Assistant

## 2018-03-24 VITALS — BP 136/78 | HR 81 | Ht 63.5 in | Wt 232.0 lb

## 2018-03-24 DIAGNOSIS — E782 Mixed hyperlipidemia: Secondary | ICD-10-CM

## 2018-03-24 DIAGNOSIS — I480 Paroxysmal atrial fibrillation: Secondary | ICD-10-CM

## 2018-03-24 DIAGNOSIS — I10 Essential (primary) hypertension: Secondary | ICD-10-CM

## 2018-03-24 DIAGNOSIS — G4733 Obstructive sleep apnea (adult) (pediatric): Secondary | ICD-10-CM

## 2018-03-24 DIAGNOSIS — Z9989 Dependence on other enabling machines and devices: Secondary | ICD-10-CM

## 2018-03-24 DIAGNOSIS — I25810 Atherosclerosis of coronary artery bypass graft(s) without angina pectoris: Secondary | ICD-10-CM

## 2018-03-24 DIAGNOSIS — E1142 Type 2 diabetes mellitus with diabetic polyneuropathy: Secondary | ICD-10-CM

## 2018-03-24 DIAGNOSIS — Z950 Presence of cardiac pacemaker: Secondary | ICD-10-CM

## 2018-03-24 NOTE — Progress Notes (Signed)
Cardiology Office Note    Date:  03/24/2018   ID:  [name], DOB Jan 18, 1946, MRN [id number]  PCP:  Dana Graves, DO  Cardiologist: Dr. Claiborne Billings EP: Dr. [name]  Chief Complaint  Patient presents with  . Follow-up    discuss amiodarone    History of Present Illness:  [name] is a 73 y.o. female with PMH of CAD s/p CABG x 2 and MAZE 2005, PAF on eliquis, DM II, peripheral neuropathy, HTN, SSS s/p PPM 06/05/2014, HLD, OSA and obesity.  She had a negative Myoview in 2014.  She was hospitalized in June 2017 for A. fib with RVR.  Echocardiogram obtained during the admission showed normal wall motion and normal EF.  She had mild edema on diltiazem which was discontinued later.  PAF was controlled on amiodarone.  She had another echocardiogram obtained in April 2018 that showed EF 60 to 65%, grade 2 DD.  She was seen in the ED in January 2019 with dizziness.  After the ED visit, she was seen by Fabian Sharp PA-C in the clinic on 04/21/2017.  Device interrogation showed adequate battery life with normal pacemaker function, she is a true paced 99% of the time, no AT or A. fib episodes.  She was referred to the lipid clinic who added pravastatin 20 mg daily to help control her cholesterol.  Patient was last seen by Dr. Claiborne Billings on 11/19/2017, she was compliant with her CPAP therapy.  Her pravastatin was also increased to 40 mg daily.  She was most recently seen by Dr. [name] on 12/25/2017, her Lasix was increased to 80 mg daily.  Patient presents today for cardiology office visit.  Her amiodarone was recently stopped due to abnormal pulmonary function test obtained on 12/28/2017 suggesting severe restrictive etiology including possible pulmonary fibrosis.  She is scheduled to see Dr. [name] near the end of March to discuss alternative therapy.  She is currently maintaining sinus rhythm during the amiodarone washout period.  Her blood pressure is borderline elevated and also heart rate was in the 80s  this morning, however she did not take her metoprolol this morning.  Otherwise she denies any acute issue such as chest pain, shortness of breath, lower extremity edema, orthopnea or PND.  Her major complaint is still lower extremity peripheral neuropathy for which she is taking gabapentin.  I recommend she obtain fasting lipid panel prior to her visit with Dr. Claiborne Billings in March.   Past Medical History:  Diagnosis Date  . Asthmatic bronchitis   . CHF (congestive heart failure) ([place]) dx'd 2016  . Coronary atherosclerosis of native coronary artery    a. s/p CABG 2005 Dr Roxy Manns    . Hyperlipidemia   . Hypertension   . Hypertensive heart disease   . Kidney stones   . Mixed hyperlipidemia   . Paroxysmal atrial fibrillation ([place])    a. s/p Maze in 2005 b. recurrence in March 2016 with >8sec posttermination pauses s/p PPM implant    . Pneumonia 1950   "double"  . Presence of permanent cardiac pacemaker   . Sleep apnea    "tested; mask ordered; couldn't afford it; will get it now" (09/04/2015)  . TIA (transient ischemic attack)   . Type II diabetes mellitus ([place])   . Uterine cancer [facility])    age 54 with partial hysterectomy    Past Surgical History:  Procedure Laterality Date  . CARDIAC CATHETERIZATION  2005  . CORONARY ARTERY BYPASS GRAFT  2005  Dr Roxy Manns with MAZE  . CYSTOSCOPY WITH URETEROSCOPY, STONE BASKETRY AND STENT PLACEMENT Left 09/08/2014   Procedure: CYSTOSCOPY WITH URETEROSCOPY, STONE BASKETRY AND STENT PLACEMENT;  Surgeon: Cleon Gustin, MD;  Location: [facility];  Service: Urology;  Laterality: Left;  . CYSTOSCOPY/URETEROSCOPY/HOLMIUM LASER/STENT PLACEMENT Left 09/15/2014   Procedure: CYSTOSCOPY/RETROGRADE/URETEROSCOPY/HOLMIUM LASER/ STONE EXTRACTION /STENT EXCHANGE;  Surgeon: Cleon Gustin, MD;  Location: [facility];  Service: Urology;  Laterality: Left;  . HOLMIUM LASER APPLICATION Left 4/91/7915   Procedure: HOLMIUM LASER APPLICATION;  Surgeon: Cleon Gustin, MD;  Location:  [facility];  Service: Urology;  Laterality: Left;  . HOLMIUM LASER APPLICATION Left 0/07/6977   Procedure: HOLMIUM LASER APPLICATION;  Surgeon: Cleon Gustin, MD;  Location: [facility];  Service: Urology;  Laterality: Left;  . INSERT / REPLACE / REMOVE PACEMAKER    . LAPAROSCOPIC CHOLECYSTECTOMY    . 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 VAGINAL HYSTERECTOMY  1975    Current Medications: Outpatient Medications Prior to Visit  Medication Sig Dispense Refill  . acetaminophen (TYLENOL) 500 MG tablet Take 500-1,000 mg by mouth every 6 (six) hours as needed for moderate pain.    [name] apixaban (ELIQUIS) 5 MG TABS tablet Take 1 tablet (5 mg total) by mouth 2 (two) times daily. Take 1st dose 06-07-14 60 tablet 1  . aspirin 81 MG tablet Take 81 mg by mouth daily.     . B Complex-Biotin-FA (B-COMPLEX PO) Take 1 capsule by mouth every morning.     [name] BIOTIN PO Take 1 tablet by mouth daily.    . Cholecalciferol (VITAMIN D3) 1000 UNITS CAPS Take 1,000 Units by mouth daily.    . Coenzyme Q10 (CO Q-10) 200 MG CAPS Take 200 mg by mouth daily.    . diphenhydramine-acetaminophen (TYLENOL PM) 25-500 MG TABS tablet Take 1 tablet by mouth at bedtime as needed (sleep).    . ezetimibe (ZETIA) 10 MG tablet Take 10 mg by mouth daily.    . fenofibrate 160 MG tablet Take 1 tablet (160 mg total) by mouth daily. 90 tablet 1  . furosemide (LASIX) 40 MG tablet Take 2 tablets (80 mg total) by mouth daily. 180 tablet 3  . gabapentin (NEURONTIN) 300 MG capsule Take 300 mg by mouth at bedtime.     . GuaiFENesin (TUSSIN PO) Take by mouth 2 (two) times daily as needed (Cough/congestion). Pt unsure of strength    . Icosapent Ethyl (VASCEPA) 1 g CAPS Take 2 capsules (2 g total) by mouth 2 (two) times daily. 120 capsule 1  . insulin aspart protamine- aspart (NOVOLOG MIX 70/30) (70-30) 100 UNIT/ML injection Inject 35 Units into the skin 2 (two)  times daily with a meal.     . losartan (COZAAR) 50 MG tablet TAKE 1 TABLET UPTO TWICE A DAY AS DIRECTED (Patient taking differently: Take 50 mg by mouth. TAKE 1 TABLET UPTO TWICE A DAY AS DIRECTED) 180 tablet 2  . metFORMIN (GLUCOPHAGE) 500 MG tablet Take 500 mg by mouth 2 (two) times daily with a meal.     . metoprolol succinate (TOPROL-XL) 100 MG 24 hr tablet Take 1 tablet daily with or immediatelyfollowing a meal. 30 tablet 0  . NEOMYCIN-POLYMYXIN-HYDROCORTISONE (CORTISPORIN) 1 % SOLN otic solution Place 4 drops into both ears 2 (two) times daily as needed. For pain and itching    . Omega-3 Fatty Acids (FISH OIL PO) Take 2 capsules  by mouth 2 (two) times daily.    . potassium chloride (K-DUR,KLOR-CON) 10 MEQ tablet Take 10 mEq by mouth every morning.     . pravastatin (PRAVACHOL) 40 MG tablet Take 1 tablet (40 mg total) by mouth daily. 90 tablet 3  . HUMULIN 70/30 KWIKPEN (70-30) 100 UNIT/ML PEN Inject 35 Units into the muscle 2 (two) times daily at 8 am and 10 pm.     No facility-administered medications prior to visit.      Allergies:   Patient has no known allergies.   Social History   Socioeconomic History  . Marital status: Married    Spouse name: Not on file  . Number of children: Not on file  . Years of education: Not on file  . Highest education level: Not on file  Occupational History  . Not on file  Social Needs  . Financial resource strain: Not on file  . Food insecurity:    Worry: Not on file    Inability: Not on file  . Transportation needs:    Medical: Not on file    Non-medical: Not on file  Tobacco Use  . Smoking status: Never Smoker  . Smokeless tobacco: Never Used  Substance and Sexual Activity  . Alcohol use: No  . Drug use: No  . Sexual activity: Not on file  Lifestyle  . Physical activity:    Days per week: Not on file    Minutes per session: Not on file  . Stress: Not on file  Relationships  . Social connections:    Talks on phone: Not on file      Gets together: Not on file    Attends religious service: Not on file    Active member of club or organization: Not on file    Attends meetings of clubs or organizations: Not on file    Relationship status: Not on file  Other Topics Concern  . Not on file  Social History Narrative  . Not on file     Family History:  The patient's family history includes Arthritis in her brother; Diabetes in her father; Heart attack in her maternal grandfather; Heart disease in her father and mother; Hyperlipidemia in her mother; Hypertension in her daughter, mother, and sister; Stroke in her father and paternal grandfather.   ROS:   Please see the history of present illness.    ROS All other systems reviewed and are negative.   PHYSICAL EXAM:   VS:  BP 136/78   Pulse 81   Ht 5' 3.5" (1.613 m)   Wt 232 lb (105.2 kg)   BMI 40.45 kg/m    GEN: Well nourished, well developed, in no acute distress  HEENT: normal  Neck: no JVD, carotid bruits, or masses Cardiac: RRR; no murmurs, rubs, or gallops,no edema  Respiratory:  clear to auscultation bilaterally, normal work of breathing GI: soft, nontender, nondistended, + BS MS: no deformity or atrophy  Skin: warm and dry, no rash Neuro:  Alert and Oriented x 3, Strength and sensation are intact Psych: euthymic mood, full affect  Wt Readings from Last 3 Encounters:  03/24/18 232 lb (105.2 kg)  03/03/18 231 lb 6.4 oz (105 kg)  02/04/18 235 lb 6.4 oz (106.8 kg)      Studies/Labs Reviewed:   EKG:  EKG is ordered today.  The ekg ordered today demonstrates normal sinus rhythm, right bundle branch block.  Recent Labs: 04/05/2017: Hemoglobin 12.0; Platelets 279 03/19/2018: ALT 23; BUN 22; Creatinine, Ser  0.88; Potassium 4.8; Sodium 139   Lipid Panel    Component Value Date/Time   CHOL 188 03/19/2018 0931   TRIG 228 (H) 03/19/2018 0931   HDL 48 03/19/2018 0931   CHOLHDL 3.9 03/19/2018 0931   CHOLHDL 4.7 09/08/2016 0910   VLDL 42 (H) 09/08/2016  0910   LDLCALC 94 03/19/2018 0931    Additional studies/ records that were reviewed today include:   Echo 07/03/2016 LV EF: 65% -   70% Study Conclusions  - Left ventricle: The cavity size was normal. There was moderate   concentric hypertrophy. Systolic function was vigorous. The   estimated ejection fraction was in the range of 65% to 70%. Wall   motion was normal; there were no regional wall motion   abnormalities. Features are consistent with a pseudonormal left   ventricular filling pattern, with concomitant abnormal relaxation   and increased filling pressure (grade 2 diastolic dysfunction). - Aortic valve: Transvalvular velocity was increased. There was no   stenosis. - Aorta: Aortic root dimension: 38 mm (ED). - Ascending aorta: The ascending aorta was mildly dilated. - Mitral valve: Calcified annulus. Mildly thickened leaflets . The   findings are consistent with moderate stenosis. - Right ventricle: The cavity size was mildly dilated. Wall   thickness was normal.     ASSESSMENT:    1. Paroxysmal atrial fibrillation (HCC)   2. Mixed hyperlipidemia   3. Coronary artery disease involving coronary bypass graft of native heart without angina pectoris   4. Controlled type 2 diabetes mellitus with diabetic polyneuropathy, without long-term current use of insulin ([place])   5. Pacemaker   6. Essential hypertension   7. OSA on CPAP      PLAN:  In order of problems listed above:  1. Paroxysmal atrial fibrillation: Currently maintaining sinus rhythm on rate control therapy and Eliquis.  Amiodarone was recently discontinued as pulmonary function test obtained in October 2019 suggested possible pulmonary fibrosis.  Her shortness of breath is still at baseline however does have a cough.  She is scheduled to see Dr. [name] near the end of March to discuss alternative therapy.  Given her history of bypass surgery, option is quite limited, including sotalol versus Tikosyn  2. CAD  s/p CABG: Denies any recent anginal symptoms  3. Hypertension: Blood pressure borderline elevated today, however this is before she take any metoprolol  4. Hyperlipidemia: She is on pravastatin 40 mg daily, Zetia and Vascepa was recently added.  Will need a fasting lipid panel before her next visit with Dr. Claiborne Billings, if LDL remains elevated, consider increasing pravastatin to 80 mg daily  5. Sick sinus syndrome status post pacemaker: Followed by Dr. [name]  6. Obstructive sleep apnea on CPAP: Upcoming visit with Dr. Claiborne Billings in the sleep clinic.  She says she has 100% compliance on the CPAP therapy.    Medication Adjustments/Labs and Tests Ordered: Current medicines are reviewed at length with the patient today.  Concerns regarding medicines are outlined above.  Medication changes, Labs and Tests ordered today are listed in the Patient Instructions below. Patient Instructions  Medication Instructions:  Your physician recommends that you continue on your current medications as directed. Please refer to the Current Medication list given to you today. If you need a refill on your cardiac medications before your next appointment, please call your pharmacy.   Lab work: .Your physician recommends that you return for lab work in: 1 week before next appt FASTING LIPID If you have labs (blood work)  drawn today and your tests are completely normal, you will receive your results only by: [name] MyChart Message (if you have MyChart) OR . A paper copy in the mail If you have any lab test that is abnormal or we need to change your treatment, we will call you to review the results.  Testing/Procedures: None   Follow-Up: At [facility], you and your health needs are our priority.  As part of our continuing mission to provide you with exceptional heart care, we have created designated Provider Care Teams.  These Care Teams include your primary Cardiologist (physician) and Advanced Practice Providers (APPs -   Physician Assistants and Nurse Practitioners) who all work together to provide you with the care you need, when you need it. [name] Cristofher Livecchi RECOMMENDS YOU FOLLOW UP WITH DR Claiborne Billings AS SCHEDULED  Any Other Special Instructions Will Be Listed Below (If Applicable).      Hilbert Corrigan, [place]  03/24/2018 9:36 AM    [address], [place], [place]  [address] Phone: [phone]; Fax: [phone]

## 2018-03-24 NOTE — Patient Instructions (Signed)
Medication Instructions:  Your physician recommends that you continue on your current medications as directed. Please refer to the Current Medication list given to you today. If you need a refill on your cardiac medications before your next appointment, please call your pharmacy.   Lab work: .Your physician recommends that you return for lab work in: 1 week before next appt FASTING LIPID If you have labs (blood work) drawn today and your tests are completely normal, you will receive your results only by: [name] MyChart Message (if you have MyChart) OR . A paper copy in the mail If you have any lab test that is abnormal or we need to change your treatment, we will call you to review the results.  Testing/Procedures: None   Follow-Up: At [facility], you and your health needs are our priority.  As part of our continuing mission to provide you with exceptional heart care, we have created designated Provider Care Teams.  These Care Teams include your primary Cardiologist (physician) and Advanced Practice Providers (APPs -  Physician Assistants and Nurse Practitioners) who all work together to provide you with the care you need, when you need it. [name] HAO RECOMMENDS YOU FOLLOW UP WITH DR Claiborne Billings AS SCHEDULED  Any Other Special Instructions Will Be Listed Below (If Applicable).

## 2018-03-25 ENCOUNTER — Encounter: Payer: Self-pay | Admitting: Cardiology

## 2018-03-26 ENCOUNTER — Ambulatory Visit (INDEPENDENT_AMBULATORY_CARE_PROVIDER_SITE_OTHER): Payer: Medicare Other

## 2018-03-26 DIAGNOSIS — I495 Sick sinus syndrome: Secondary | ICD-10-CM | POA: Diagnosis not present

## 2018-03-26 DIAGNOSIS — I480 Paroxysmal atrial fibrillation: Secondary | ICD-10-CM

## 2018-03-29 NOTE — Progress Notes (Signed)
Remote pacemaker transmission.

## 2018-03-31 ENCOUNTER — Encounter: Payer: Self-pay | Admitting: Cardiology

## 2018-04-01 LAB — CUP PACEART REMOTE DEVICE CHECK
Battery Remaining Longevity: 74 mo
Brady Statistic AP VS Percent: 98.07 %
Brady Statistic AS VP Percent: 0 %
Brady Statistic RA Percent Paced: 98.25 %
Date Time Interrogation Session: 20200110202303
Implantable Lead Implant Date: 20160321
Implantable Lead Location: 753860
Implantable Lead Model: 5076
Implantable Lead Model: 5076
Implantable Pulse Generator Implant Date: 20160321
Lead Channel Impedance Value: 361 Ohm
Lead Channel Pacing Threshold Pulse Width: 0.4 ms
Lead Channel Pacing Threshold Pulse Width: 0.4 ms
Lead Channel Sensing Intrinsic Amplitude: 1.25 mV
Lead Channel Sensing Intrinsic Amplitude: 1.25 mV
Lead Channel Sensing Intrinsic Amplitude: 15 mV
Lead Channel Setting Pacing Pulse Width: 0.4 ms
MDC IDC LEAD IMPLANT DT: 20160321
MDC IDC LEAD LOCATION: 753859
MDC IDC MSMT BATTERY VOLTAGE: 3.01 V
MDC IDC MSMT LEADCHNL RA IMPEDANCE VALUE: 475 Ohm
MDC IDC MSMT LEADCHNL RA PACING THRESHOLD AMPLITUDE: 0.75 V
MDC IDC MSMT LEADCHNL RV IMPEDANCE VALUE: 513 Ohm
MDC IDC MSMT LEADCHNL RV IMPEDANCE VALUE: 551 Ohm
MDC IDC MSMT LEADCHNL RV PACING THRESHOLD AMPLITUDE: 0.5 V
MDC IDC MSMT LEADCHNL RV SENSING INTR AMPL: 15 mV
MDC IDC SET LEADCHNL RA PACING AMPLITUDE: 2 V
MDC IDC SET LEADCHNL RV PACING AMPLITUDE: 2.5 V
MDC IDC SET LEADCHNL RV SENSING SENSITIVITY: 0.9 mV
MDC IDC STAT BRADY AP VP PERCENT: 0.24 %
MDC IDC STAT BRADY AS VS PERCENT: 1.69 %
MDC IDC STAT BRADY RV PERCENT PACED: 0.25 %

## 2018-06-04 ENCOUNTER — Encounter: Payer: Self-pay | Admitting: Internal Medicine

## 2018-06-04 ENCOUNTER — Telehealth: Payer: Self-pay

## 2018-06-04 NOTE — Telephone Encounter (Signed)
   Cardiac Questionnaire:    Since your last visit or hospitalization: (sleep patient)    1. Have you been having new or worsening chest pain? NO   2. Have you been having new or worsening shortness of breath? NO 3. Have you been having new or worsening leg swelling, wt gain, or increase in abdominal girth (pants fitting more tightly)? NO   4. Have you had any passing out spells? NO         Primary Cardiologist:  No primary care provider on file.   Patient contacted.  History reviewed.  No symptoms to suggest any unstable cardiac conditions.  Based on discussion, with current pandemic situation, we will be postponing this appointment for [facility].  If symptoms change, she has been instructed to contact our office.   Routing to C19 CANCEL pool for tracking (P CV DIV CV19 CANCEL) and assigning priority (1 = 4-6 wks, 2 = 6-12 wks, 3 = >12 wks).  Priority 3  Ena Dawley, LPN  6/46/8032 1:22 PM         .

## 2018-06-08 ENCOUNTER — Ambulatory Visit: Payer: Medicare Other | Admitting: Cardiovascular Disease

## 2018-06-09 ENCOUNTER — Telehealth (INDEPENDENT_AMBULATORY_CARE_PROVIDER_SITE_OTHER): Payer: Medicare Other | Admitting: Internal Medicine

## 2018-06-09 ENCOUNTER — Other Ambulatory Visit: Payer: Self-pay

## 2018-06-09 DIAGNOSIS — I495 Sick sinus syndrome: Secondary | ICD-10-CM

## 2018-06-09 DIAGNOSIS — I480 Paroxysmal atrial fibrillation: Secondary | ICD-10-CM | POA: Diagnosis not present

## 2018-06-09 NOTE — Progress Notes (Signed)
Electrophysiology TeleHealth Note   Due to national recommendations of social distancing due to COVID 19, an audio/video telehealth visit is felt to be most appropriate for this patient at this time.  See MyChart message from today for the patient's consent to telehealth for [facility].   Date:  06/09/2018   ID:  [name], DOB November 10, 1945, MRN [id number]  Location: patient's home  Provider location: [address] [place]  Evaluation Performed: Follow-up visit  PCP:  [name], DO  Cardiologist:  No primary care provider on file. [name] Electrophysiologist:  [name]  Chief Complaint:  followup atrial fib  History of Present Illness:    [name] is a 73 y.o. female with sinus node dysfunction, s/p PPM, persistent atrial fib, on amiodarone who developed cough and was found to have evidence of amio lung disease. The patient had amio stopped several weeks ago and almost immediately began to feel better. her cough, sob, and edema are all much improved. She has not had symptomatic atrial fib. Today, she denies symptoms of palpitations, chest pain, shortness of breath,  lower extremity edema, dizziness, presyncope, or syncope.  The patient is otherwise without complaint today.  The patient denies symptoms of fevers, chills, cough, or new SOB worrisome for COVID 19.   Past Medical History:  Diagnosis Date  . Asthmatic bronchitis   . CHF (congestive heart failure) ([place]) dx'd 2016  . Coronary atherosclerosis of native coronary artery    a. s/p CABG 2005 Dr Roxy Manns    . Hyperlipidemia   . Hypertension   . Hypertensive heart disease   . Kidney stones   . Mixed hyperlipidemia   . Paroxysmal atrial fibrillation ([place])    a. s/p Maze in 2005 b. recurrence in March 2016 with >8sec posttermination pauses s/p PPM implant    . Pneumonia 1950   "double"  . Presence of permanent cardiac pacemaker   . Sleep apnea    "tested; mask ordered; couldn't afford it;  will get it now" (09/04/2015)  . TIA (transient ischemic attack)   . Type II diabetes mellitus ([place])   . Uterine cancer [facility])    age 4 with partial hysterectomy    Past Surgical History:  Procedure Laterality Date  . CARDIAC CATHETERIZATION  2005  . CORONARY ARTERY BYPASS GRAFT  2005   Dr Roxy Manns with MAZE  . CYSTOSCOPY WITH URETEROSCOPY, STONE BASKETRY AND STENT PLACEMENT Left 09/08/2014   Procedure: CYSTOSCOPY WITH URETEROSCOPY, STONE BASKETRY AND STENT PLACEMENT;  Surgeon: Cleon Gustin, MD;  Location: [facility];  Service: Urology;  Laterality: Left;  . CYSTOSCOPY/URETEROSCOPY/HOLMIUM LASER/STENT PLACEMENT Left 09/15/2014   Procedure: CYSTOSCOPY/RETROGRADE/URETEROSCOPY/HOLMIUM LASER/ STONE EXTRACTION /STENT EXCHANGE;  Surgeon: Cleon Gustin, MD;  Location: [facility];  Service: Urology;  Laterality: Left;  . HOLMIUM LASER APPLICATION Left 0/76/2263   Procedure: HOLMIUM LASER APPLICATION;  Surgeon: Cleon Gustin, MD;  Location: [facility];  Service: Urology;  Laterality: Left;  . HOLMIUM LASER APPLICATION Left 05/18/5454   Procedure: HOLMIUM LASER APPLICATION;  Surgeon: Cleon Gustin, MD;  Location: [facility];  Service: Urology;  Laterality: Left;  . INSERT / REPLACE / REMOVE PACEMAKER    . LAPAROSCOPIC CHOLECYSTECTOMY    . 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 VAGINAL HYSTERECTOMY  1975    Current Outpatient Medications  Medication Sig Dispense Refill  . acetaminophen (TYLENOL) 500 MG tablet Take  500-1,000 mg by mouth every 6 (six) hours as needed for moderate pain.    [name] apixaban (ELIQUIS) 5 MG TABS tablet Take 1 tablet (5 mg total) by mouth 2 (two) times daily. Take 1st dose 06-07-14 60 tablet 1  . aspirin 81 MG tablet Take 81 mg by mouth daily.     . B Complex-Biotin-FA (B-COMPLEX PO) Take 1 capsule by mouth every morning.     [name] BIOTIN PO Take 1 tablet by mouth daily.    . Cholecalciferol  (VITAMIN D3) 1000 UNITS CAPS Take 1,000 Units by mouth daily.    . Coenzyme Q10 (CO Q-10) 200 MG CAPS Take 200 mg by mouth daily.    . diphenhydramine-acetaminophen (TYLENOL PM) 25-500 MG TABS tablet Take 1 tablet by mouth at bedtime as needed (sleep).    . ezetimibe (ZETIA) 10 MG tablet Take 10 mg by mouth daily.    . fenofibrate 160 MG tablet Take 1 tablet (160 mg total) by mouth daily. 90 tablet 1  . furosemide (LASIX) 40 MG tablet Take 2 tablets (80 mg total) by mouth daily. 180 tablet 3  . gabapentin (NEURONTIN) 300 MG capsule Take 300 mg by mouth at bedtime.     . GuaiFENesin (TUSSIN PO) Take by mouth 2 (two) times daily as needed (Cough/congestion). Pt unsure of strength    . HUMULIN 70/30 KWIKPEN (70-30) 100 UNIT/ML PEN Inject 35 Units into the muscle 2 (two) times daily at 8 am and 10 pm.    . Icosapent Ethyl (VASCEPA) 1 g CAPS Take 2 capsules (2 g total) by mouth 2 (two) times daily. 120 capsule 1  . insulin aspart protamine- aspart (NOVOLOG MIX 70/30) (70-30) 100 UNIT/ML injection Inject 35 Units into the skin 2 (two) times daily with a meal.     . losartan (COZAAR) 50 MG tablet TAKE 1 TABLET UPTO TWICE A DAY AS DIRECTED (Patient taking differently: Take 50 mg by mouth. TAKE 1 TABLET UPTO TWICE A DAY AS DIRECTED) 180 tablet 2  . metFORMIN (GLUCOPHAGE) 500 MG tablet Take 500 mg by mouth 2 (two) times daily with a meal.     . metoprolol succinate (TOPROL-XL) 100 MG 24 hr tablet Take 1 tablet daily with or immediatelyfollowing a meal. 30 tablet 0  . NEOMYCIN-POLYMYXIN-HYDROCORTISONE (CORTISPORIN) 1 % SOLN otic solution Place 4 drops into both ears 2 (two) times daily as needed. For pain and itching    . Omega-3 Fatty Acids (FISH OIL PO) Take 2 capsules by mouth 2 (two) times daily.    . potassium chloride (K-DUR,KLOR-CON) 10 MEQ tablet Take 10 mEq by mouth every morning.     . pravastatin (PRAVACHOL) 40 MG tablet Take 1 tablet (40 mg total) by mouth daily. 90 tablet 3   No current  facility-administered medications for this visit.     Allergies:   Patient has no known allergies.   Social History:  The patient  reports that she has never smoked. She has never used smokeless tobacco. She reports that she does not drink alcohol or use drugs.   Family History:  The patient's  family history includes Arthritis in her brother; Diabetes in her father; Heart attack in her maternal grandfather; Heart disease in her father and mother; Hyperlipidemia in her mother; Hypertension in her daughter, mother, and sister; Stroke in her father and paternal grandfather.   ROS:  Please see the history of present illness.   All other systems are personally reviewed and negative.    Exam:  Vital Signs:  There were no vitals taken for this visit. She did not take her vitals at home today.   Well appearing, alert and conversant, regular work of breathing,  good skin color Eyes- anicteric, neuro- grossly intact, skin- no apparent rash or lesions or cyanosis, mouth- oral mucosa is pink   Labs/Other Tests and Data Reviewed:    Recent Labs: 03/19/2018: ALT 23; BUN 22; Creatinine, Ser 0.88; Potassium 4.8; Sodium 139   Wt Readings from Last 3 Encounters:  03/24/18 232 lb (105.2 kg)  03/03/18 231 lb 6.4 oz (105 kg)  02/04/18 235 lb 6.4 oz (106.8 kg)     Other studies personally reviewed: Additional studies/ records that were reviewed today include:   Review of the above records today demonstrates: a CT of the chest with interstitial infiltrates bilaterally.  Last device remote is reviewed from [facility] PDF dated 03/2018 which reveals normal device function, no arrhythmias    ASSESSMENT & PLAN:    1.  Sinus node dysfunction - she is maintaining NSR by history so far. She will continue to remain off of amiodarone. 2. Persistent atrial fib - she has not yet reverted back to NSR. I will see her back in 4 months. Hopefully when she goes back to NSR, she will be less symptomatic 3. amio lung  - she is improved now that she has been off amio for over a month. She will undergo watchful waiting. 4. HTN heart disease - she will continue her current meds.    COVID 19 screen The patient denies symptoms of COVID 19 at this time.  The importance of social distancing was discussed today.  Follow-up:  4 months Next remote: 06/2018  Current medicines are reviewed at length with the patient today.   The patient does not have concerns regarding her medicines.  The following changes were made today:  none  Labs/ tests ordered today include: none No orders of the defined types were placed in this encounter.    Patient Risk:  after full review of this patients clinical status, I feel that they are at moderate risk at this time.  Today, I have spent 15 minutes with the patient with telehealth technology discussing  .    Signed, Dana Peru, MD  06/09/2018 3:18 PM     [place] [address] [phone] (office) [phone] (fax)

## 2018-06-15 ENCOUNTER — Encounter: Payer: Medicare Other | Admitting: Internal Medicine

## 2018-06-25 ENCOUNTER — Other Ambulatory Visit: Payer: Self-pay

## 2018-06-25 ENCOUNTER — Ambulatory Visit (INDEPENDENT_AMBULATORY_CARE_PROVIDER_SITE_OTHER): Payer: Medicare Other | Admitting: *Deleted

## 2018-06-25 DIAGNOSIS — I495 Sick sinus syndrome: Secondary | ICD-10-CM | POA: Diagnosis not present

## 2018-06-25 DIAGNOSIS — I480 Paroxysmal atrial fibrillation: Secondary | ICD-10-CM

## 2018-06-26 LAB — CUP PACEART REMOTE DEVICE CHECK
Battery Remaining Longevity: 73 mo
Battery Voltage: 3.01 V
Brady Statistic AP VP Percent: 0.24 %
Brady Statistic AP VS Percent: 97.4 %
Brady Statistic AS VP Percent: 0.01 %
Brady Statistic AS VS Percent: 2.36 %
Brady Statistic RA Percent Paced: 97.51 %
Brady Statistic RV Percent Paced: 0.25 %
Date Time Interrogation Session: 20200410165413
Implantable Lead Implant Date: 20160321
Implantable Lead Implant Date: 20160321
Implantable Lead Location: 753859
Implantable Lead Location: 753860
Implantable Lead Model: 5076
Implantable Lead Model: 5076
Implantable Pulse Generator Implant Date: 20160321
Lead Channel Impedance Value: 361 Ohm
Lead Channel Impedance Value: 475 Ohm
Lead Channel Impedance Value: 513 Ohm
Lead Channel Impedance Value: 551 Ohm
Lead Channel Pacing Threshold Amplitude: 0.5 V
Lead Channel Pacing Threshold Amplitude: 0.75 V
Lead Channel Pacing Threshold Pulse Width: 0.4 ms
Lead Channel Pacing Threshold Pulse Width: 0.4 ms
Lead Channel Sensing Intrinsic Amplitude: 1.125 mV
Lead Channel Sensing Intrinsic Amplitude: 1.125 mV
Lead Channel Sensing Intrinsic Amplitude: 17.625 mV
Lead Channel Sensing Intrinsic Amplitude: 17.625 mV
Lead Channel Setting Pacing Amplitude: 2 V
Lead Channel Setting Pacing Amplitude: 2.5 V
Lead Channel Setting Pacing Pulse Width: 0.4 ms
Lead Channel Setting Sensing Sensitivity: 0.9 mV

## 2018-07-02 NOTE — Progress Notes (Signed)
Remote pacemaker transmission.   

## 2018-08-17 ENCOUNTER — Ambulatory Visit: Payer: Medicare Other | Admitting: Pulmonary Disease

## 2018-08-26 ENCOUNTER — Other Ambulatory Visit: Payer: Self-pay | Admitting: Cardiovascular Disease

## 2018-09-14 ENCOUNTER — Telehealth: Payer: Self-pay | Admitting: Cardiovascular Disease

## 2018-09-14 NOTE — Telephone Encounter (Signed)
COVID-19 Pre-Screening Questions:   In the past 7 to 10 days have you had a cough,  shortness of breath, headache, congestion, fever (100 or greater) body aches, chills, sore throat, or sudden loss of taste or sense of smell? no  Have you been around anyone with known Covid 19. No   Have you been around anyone who is awaiting Covid 19 test results in the past 7 to 10 days? no  Have you been around anyone who has been exposed to Covid 19, or has mentioned symptoms of Covid 19 within the past 7 to 10 days? no  If you have any concerns/questions about symptoms patients report during screening (either on the phone or at threshold). Contact the provider seeing the patient or DOD for further guidance.  If neither are available contact a member of the leadership team.  I called to confirm her appt with Dr Claiborne Billings on 09-15-18.  Pt says she needs her daughter to come in with her for appt, she is hard of hearing.

## 2018-09-15 ENCOUNTER — Ambulatory Visit (INDEPENDENT_AMBULATORY_CARE_PROVIDER_SITE_OTHER): Payer: Medicare Other | Admitting: Cardiovascular Disease

## 2018-09-15 ENCOUNTER — Telehealth: Payer: Self-pay | Admitting: Pulmonary Disease

## 2018-09-15 ENCOUNTER — Encounter: Payer: Self-pay | Admitting: Cardiovascular Disease

## 2018-09-15 ENCOUNTER — Other Ambulatory Visit: Payer: Self-pay

## 2018-09-15 VITALS — BP 126/64 | HR 85 | Temp 98.2°F | Ht 64.0 in | Wt 235.0 lb

## 2018-09-15 DIAGNOSIS — I495 Sick sinus syndrome: Secondary | ICD-10-CM

## 2018-09-15 DIAGNOSIS — Z9989 Dependence on other enabling machines and devices: Secondary | ICD-10-CM

## 2018-09-15 DIAGNOSIS — Z951 Presence of aortocoronary bypass graft: Secondary | ICD-10-CM

## 2018-09-15 DIAGNOSIS — I10 Essential (primary) hypertension: Secondary | ICD-10-CM

## 2018-09-15 DIAGNOSIS — E782 Mixed hyperlipidemia: Secondary | ICD-10-CM

## 2018-09-15 DIAGNOSIS — Z950 Presence of cardiac pacemaker: Secondary | ICD-10-CM

## 2018-09-15 DIAGNOSIS — I2510 Atherosclerotic heart disease of native coronary artery without angina pectoris: Secondary | ICD-10-CM

## 2018-09-15 DIAGNOSIS — T462X5A Adverse effect of other antidysrhythmic drugs, initial encounter: Secondary | ICD-10-CM

## 2018-09-15 DIAGNOSIS — G4733 Obstructive sleep apnea (adult) (pediatric): Secondary | ICD-10-CM

## 2018-09-15 DIAGNOSIS — E119 Type 2 diabetes mellitus without complications: Secondary | ICD-10-CM

## 2018-09-15 DIAGNOSIS — Z7901 Long term (current) use of anticoagulants: Secondary | ICD-10-CM

## 2018-09-15 DIAGNOSIS — I480 Paroxysmal atrial fibrillation: Secondary | ICD-10-CM

## 2018-09-15 DIAGNOSIS — Z794 Long term (current) use of insulin: Secondary | ICD-10-CM

## 2018-09-15 DIAGNOSIS — J984 Other disorders of lung: Secondary | ICD-10-CM

## 2018-09-15 NOTE — Progress Notes (Signed)
Patient ID: [name], female   DOB: 1945-05-31, 73 y.o.   MRN: [id number]   Primary M.D.: Dr. [name]  HPI: [name] is a 73 year-old female who presents for a 10 month follow-up cardiology and sleep evaluation.    [name] admits to a history of CAD, diabetes mellitus, peripheral neuropathy, and hypertension.   [name] underwent CABG revascularization surgery x2 with a maze procedure for atrial fibrillation at that time by Dr. Roxy Manns.  [name] experienced 2 episodes of palpitations where [name] felt her heart was temporarily out of rhythm. [name] denied associated chest tightness. [name] does note some shortness of breath. . A 2-D echo Doppler study on 10/05/2012 showed normal systolic function with an ejection fraction of 55-60% with mild tissue Doppler abnormality. Her aortic valve was trileaflet and sclerotic with very mild stenosis with a mean gradient of 10 and a peak gradient of 20 mm respectively. There is trivial AR. [name] also had posterior calcified mitral annulus with mild MR.  A nuclear perfusion study in 2014 revealed mild breast attenuation but otherwise normal perfusion.   [name] underwent permanent pacemaker insertion by Dr. Crissie Sickles.  [name] also has had issues with kidney stones and underwent 2 urological procedures in June and July 2016 on her left kidney by Dr. Nicolette Bang.  [name] developed atrial fibrillation in March and again in June 2017 which occurred at night after drinking cold juice.  [name] does snore.  [name] was hospitalized overnight after [name] presented with AF with a rapid ventricular rate at 129.  [name] was started on IV diltiazem with ultimate improvement and eventually her oral dose of Cardizem was increased.  Troponins were negative.  An echo during that hospitalization showed an EF of 60-65%.  There was normal wall motion.  There was mild aortic valve stenosis and [name] had a mean gradient of 10 and peak gradient of 20 with a valve area of 1.35.  [name] was seen in follow-up by  [name] and was remaining stable.  [name] saw Dr. [name] on 12/04/2015 and had normal pacemaker function and was maintaining normal sinus rhythm over 99% of the time on her current dose of amiodarone 200 mg.    With her cardiovascular comorbidities and recurrent AF  I recommended that [name] undergo a sleep study to evaluate for sleep apnea.  This was done on 01/22/2016 and revealed moderate sleep apnea overall within AHI 15.4.  However, sleep apnea was severe during rems sleep at 36 per hour.  Her oxygen nadir was 83%.  [name] was scheduled to undergo a CPAP titration, but [name] canceled this appointment in January due to some other conflict.  When I last saw her, [name] had not yet had her CPAP titration.  I strongly recommended that [name] have this done.  He Pap titration was done on 07/30/2016 and [name] was titrated up to 15 cm water pressure.  [name] had a significant PLMS index of 85.6.  [name] has been on gabapentin for neuropathy, which will also treat restless legs.  [name] underwent an echo Doppler study on 07/03/2016 which showed an EF of 60-65%.  [name] had grade 2 diastolic dysfunction.  [name] had an upper normal to mildly increased.  The ascending aorta and a thickened mitral valve suggesting moderate stenosis.  There was a mild gradient across her aortic valve.  [name] had developed mild edema on diltiazem, which improved once this was discontinued.  [name] denies chest pain.  [name] continues to take eliquis for anticoagulation  and continues to be on amiodarone 200 mg for her PAF.   [name] has a new CPAP ResMed AirSense 10 AutoSet unit; setup was 09/30/2016.  [name] has a ResMed airFit 20 medium tsize mask.  A download was obtained from August 26 through 12/08/2016.  [name] is meeting compliance standards and had 93%.  Days of usage and 87% of days with usage greater than 4 hours.  However, [name] was only averaging 5 hours of sleep per night.  [name] is set at a 15 cm water pressure.  Her AHI is excellent at 0.1.  Used her machine were recently  due to the fact that [name] has a broken tooth.  This had resulted in significant teeth pain during the night with her unit on going into this region.  [name] was recently started on antibiotics by a dentist and will need tooth extraction.  [name] is sleeping well with her CPAP.  [name] denies breakthrough snoring.  [name] denies daytime sleepiness.    [name] developed an episode of head numbness with lightheadedness and dizziness which resulted in presenting to the emergency room in January 2019.  However, after waiting 4 hours in the waiting room after her blood work was obtained, [name] ultimately left.  [name] was seen in the office in follow-up by Fabian Sharp, [place] on April 21, 2017.  At that time her pacemaker was interrogated and showed adequate battery life with normal pacemaker function.  [name] was atrially paced 99% of the time.  There were no episodes of atrial tachycardia or atrial fibrillation.  [name] saw Dr. Crissie Sickles for follow-up evaluation and remote pacemaker device check from July 13, 2017 was normal.  [name] has a history of mixed hyperlipidemia and has had  issues with statin intolerance.  Laboratory in February 2019 showed a total cholesterol to 441, HDL 50, LDL 140, triglycerides 255.  VLDL was 51.  [name] was seen by Raquel Salvatore Decent and [name] was given a trial of pravastatin 20 mg to take in addition to her fenofibrate and omega-3 3 fatty acid.    Since I last saw her in May 2019, [name] has had some issues with bronchitis.  [name] states that [name] believes [name] may be at times retaining some fluid.  [name] has been using sodium and typically add salt to tomatoes as well as apples.  [name] also eats food with high sodium content.  [name] denies any chest pressure.  [name] is unaware of palpitations.  [name] had undergone repeat laboratory since the initiation of pravastatin which showed significant improvement in her LDL cholesterol from 140 to 95 but LDL was still increased.  Triglycerides remained elevated at 237 with the LDL at  47.    I last saw her in September 2019.  At that time her blood pressure was improved on furosemide 40 mg daily, losartan 50 mg twice a day and Toprol-XL 100 mg.  Pravastatin was further titrated to 40 mg and [name] continued to be on Zetia with an LDL of 95.  [name] was maintaining sinus rhythm on amiodarone on Monday and Saturday and was on Eliquis for anticoagulation.  [name] continued to use CPAP with 100% compliance.  Since I saw her, her amiodarone was stopped due to abnormal pulmonary function tests done in October 2019 suggesting severe restrictive etiology including possible pulmonary fibrosis.  [name] saw Almyra Deforest, [place] in follow-up in January 2020.  [name] underwent a telemedicine visit with Dr. [name] on June 09, 2018 and was maintaining sinus rhythm.  Presently,  [name] has felt well.  [name] underwent bilateral cataract surgery on June 8 and June 22.  [name] has been evaluated by Dr. Valeta Harms regarding amiodarone toxicity and will be undergoing follow-up pulmonary function testing with subsequent office visit on September 23, 2018.  [name] has had issues with ankle edema that has been mild as well as peripheral neuropathy.  [name] recently had lab work done by her primary physician Dr. Melina Copa.  [name] presents for evaluation.  Past Medical History:  Diagnosis Date   Asthmatic bronchitis    CHF (congestive heart failure) ([place]) dx'd 2016   Coronary atherosclerosis of native coronary artery    a. s/p CABG 2005 Dr Roxy Manns     Hyperlipidemia    Hypertension    Hypertensive heart disease    Kidney stones    Mixed hyperlipidemia    Paroxysmal atrial fibrillation ([place])    a. s/p Maze in 2005 b. recurrence in March 2016 with >8sec posttermination pauses s/p PPM implant     Pneumonia 1950   "double"   Presence of permanent cardiac pacemaker    Sleep apnea    "tested; mask ordered; couldn't afford it; will get it now" (09/04/2015)   TIA (transient ischemic attack)    Type II diabetes mellitus ([place])    Uterine cancer  [facility])    age 89 with partial hysterectomy    Past Surgical History:  Procedure Laterality Date   CARDIAC CATHETERIZATION  2005   CORONARY ARTERY BYPASS GRAFT  2005   Dr Roxy Manns with MAZE   CYSTOSCOPY WITH URETEROSCOPY, STONE BASKETRY AND STENT PLACEMENT Left 09/08/2014   Procedure: CYSTOSCOPY WITH URETEROSCOPY, STONE BASKETRY AND STENT PLACEMENT;  Surgeon: Cleon Gustin, MD;  Location: [facility];  Service: Urology;  Laterality: Left;   CYSTOSCOPY/URETEROSCOPY/HOLMIUM LASER/STENT PLACEMENT Left 09/15/2014   Procedure: CYSTOSCOPY/RETROGRADE/URETEROSCOPY/HOLMIUM LASER/ STONE EXTRACTION /STENT EXCHANGE;  Surgeon: Cleon Gustin, MD;  Location: [facility];  Service: Urology;  Laterality: Left;   HOLMIUM LASER APPLICATION Left 8/67/6195   Procedure: HOLMIUM LASER APPLICATION;  Surgeon: Cleon Gustin, MD;  Location: [facility];  Service: Urology;  Laterality: Left;   HOLMIUM LASER APPLICATION Left 0/11/3265   Procedure: HOLMIUM LASER APPLICATION;  Surgeon: Cleon Gustin, MD;  Location: [facility];  Service: Urology;  Laterality: Left;   INSERT / REPLACE / REMOVE PACEMAKER     LAPAROSCOPIC CHOLECYSTECTOMY     PERMANENT PACEMAKER INSERTION N/A 06/05/2014   MDT 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     apixaban (ELIQUIS) 5 MG TABS tablet Take 1 tablet (5 mg total) by mouth 2 (two) times daily. Take 1st dose 06-07-14 60 tablet 1   aspirin 81 MG tablet Take 81 mg by mouth daily.      B Complex-Biotin-FA (B-COMPLEX PO) Take 1 capsule by mouth every morning.      BIOTIN PO Take 1 tablet by mouth daily.     Cholecalciferol (VITAMIN D3) 1000 UNITS CAPS Take 1,000 Units by mouth daily.     Coenzyme Q10 (CO Q-10) 200 MG  CAPS Take 200 mg by mouth daily.     diphenhydramine-acetaminophen (TYLENOL PM) 25-500 MG TABS tablet Take 1 tablet by mouth at bedtime as needed (sleep).     ezetimibe (  ZETIA) 10 MG tablet Take 10 mg by mouth daily.     fenofibrate 160 MG tablet Take 1 tablet (160 mg total) by mouth daily. 90 tablet 1   gabapentin (NEURONTIN) 300 MG capsule Take 300 mg by mouth at bedtime.      GuaiFENesin (TUSSIN PO) Take by mouth 2 (two) times daily as needed (Cough/congestion). Pt unsure of strength     HUMULIN 70/30 KWIKPEN (70-30) 100 UNIT/ML PEN Inject 35 Units into the muscle 2 (two) times daily at 8 am and 10 pm.     Icosapent Ethyl (VASCEPA) 1 g CAPS Take 2 capsules (2 g total) by mouth 2 (two) times daily. 120 capsule 1   losartan (COZAAR) 50 MG tablet TAKE 1 TABLET UPTO TWICE A DAY AS DIRECTED 180 tablet 2   metFORMIN (GLUCOPHAGE) 500 MG tablet Take 500 mg by mouth 2 (two) times daily with a meal.      metoprolol succinate (TOPROL-XL) 100 MG 24 hr tablet Take 1 tablet daily with or immediatelyfollowing a meal. 30 tablet 0   NEOMYCIN-POLYMYXIN-HYDROCORTISONE (CORTISPORIN) 1 % SOLN otic solution Place 4 drops into both ears 2 (two) times daily as needed. For pain and itching     Omega-3 Fatty Acids (FISH OIL PO) Take 2 capsules by mouth 2 (two) times daily.     potassium chloride (K-DUR,KLOR-CON) 10 MEQ tablet Take 10 mEq by mouth every morning.      pravastatin (PRAVACHOL) 40 MG tablet Take 1 tablet (40 mg total) by mouth daily. 90 tablet 3   furosemide (LASIX) 40 MG tablet Take 2 tablets (80 mg total) by mouth daily. 180 tablet 3   No current facility-administered medications for this visit.     Socially [name] is married to [employer]; [name] has 2 children one grandchild. [name] is retired. [name] completed ninth grade education. Has no history of tobacco use. [name] is not drink alcohol. [name] does walk approximately 2-3 times per week.  Family History  Problem Relation Age of Onset    Hypertension Mother    Hyperlipidemia Mother    Heart disease Mother    Diabetes Father    Stroke Father    Heart disease Father    Hypertension Sister        3 sisters   Arthritis Brother    Heart attack Maternal Grandfather    Stroke Paternal Grandfather    Hypertension Daughter    ROS General: Negative; No fevers, chills, or night sweats;  HEENT: Negative; No changes in vision or hearing, sinus congestion, difficulty swallowing Pulmonary: Felt to have amiodarone toxicity with possible pulmonary fibrosis under evaluation by Dr. Valeta Harms Cardiovascular: He history of present illness GI: Negative; No nausea, vomiting, diarrhea, or abdominal pain GU: Positive for recent left uteroscopic stone manipulation with laser lithotripsy for left kidney stone Musculoskeletal: Negative; no myalgias, joint pain, or weakness Hematologic/Oncology: Negative; no easy bruising, bleeding Endocrine: Positive for diabetes mellitus Neuro: Positive for peripheral neuropathy Skin: Negative; No rashes or skin lesions Psychiatric: Negative; No behavioral problems, depression Sleep: Positive for OSA, Recently started on CPAP therapy;  no daytime sleepiness, hypersomnolence, bruxism, restless legs, hypnogognic hallucinations, no cataplexy Other comprehensive 14 point system review is negative.   PE BP 126/64 Comment: pt states [name] did not take BP med this morning   Pulse 85    Temp 98.2 F (36.8 C)    Ht 5' 4" (1.626 m)    Wt 235 lb (106.6 kg)    BMI 40.34 kg/m  Wt Readings from Last 3 Encounters:  09/15/18 235 lb (106.6 kg)  03/24/18 232 lb (105.2 kg)  03/03/18 231 lb 6.4 oz (105 kg)     Physical Exam BP 126/64 Comment: pt states [name] did not take BP med this morning   Pulse 85    Temp 98.2 F (36.8 C)    Ht 5' 4" (1.626 m)    Wt 235 lb (106.6 kg)    BMI 40.34 kg/m  General: Alert, oriented, no distress.  Skin: normal turgor, no rashes, warm and dry HEENT: Normocephalic, atraumatic.  Pupils equal round and reactive to light; sclera anicteric; extraocular muscles intact;  Nose without nasal septal hypertrophy Mouth/Parynx benign; Mallinpatti scale 3 Neck: No JVD, no carotid bruits; normal carotid upstroke Lungs: No audible wheezing Chest wall: without tenderness to palpitation Heart: PMI not displaced, RRR, s1 s2 normal, 1/6 systolic murmur, no diastolic murmur, no rubs, gallops, thrills, or heaves Abdomen: soft, nontender; no hepatosplenomehaly, BS+; abdominal aorta nontender and not dilated by palpation. Back: no CVA tenderness Pulses 2+ Musculoskeletal: full range of motion, normal strength, no joint deformities Extremities: Trivial ankle edema no clubbing cyanosis, Homan's sign negative  Neurologic: grossly nonfocal; Cranial nerves grossly wnl Psychologic: Normal mood and affect   ECG (independently read by me): Atrially paced rhythm at 85 bpm; prolonged AV conduction with PR interval 246 ms.  Nonspecific ST changes seconds  September 2018 ECG (independently read by me): Atrially paced at 84 bpm.  Incomplete right bundle branch block.  Anterolateral ST abnormality.  May 2019 ECG (independently read by me): Atrially paced rhythm with ventricular rate at 67 bpm.  Prolonged AV conduction with PR interval 272 ms.  QTC prolongation _0 ms.  Nonspecific ST-T changes.  September 2018 ECG (independently read by me): Atrially paced rhythm at 83 bpm with prolonged AV conduction with a PR interval 284 ms.  QTc interval 484 ms.  Nonspecific ST changes.  March 2018 ECG (independently read by me): Atrially paced rhythm at 81 bpm.  Prolonged AV conduction with a PR interval at 258 ms.  QTc interval 473 ms.  Nonspecific ST-T changes.  September 2017 ECG (independently read by me): Atrially paced rhythm with a PR interval at 248.  Complete right bundle branch block.  QTc interval 458 ms.  March 2017 ECG (independently read by me):  Atrially paced rhythm with prolonged AV  conduction with PR interval 232 ms.  Nonspecific ST-T changes.  September 2016 ECG (independently read by me): A paced rhythm at 63 with prolonged AV conduction with a PR 214.  QTC normal at 446 ms.  July 2015 ECG (independently read by me): Sinus rhythm with short PR 88 ms.  Nonspecific T changes.  Incomplete right bundle branch block  10/2013 ECG: Sinus rhythm at 69 beats per minute. QTc interval 445 ms.  LABS: BMP Latest Ref Rng & Units 03/19/2018 10/20/2017 04/21/2017  Glucose 65 - 99 mg/dL 204(H) 259(H) 188(H)  BUN 8 - 27 mg/dL _1 Creatinine 0.57 - 1.00 mg/dL 0.88 1.17(H) 0.82  BUN/Creat Ratio 12 - _2 Sodium 134 - 144 mmol/L 139 137 142  Potassium 3.5 - 5.2 mmol/L 4.8 4.9 4.5  Chloride 96 - 106 mmol/L 98 94(L) 99  CO2 20 - 29 mmol/L _3 Calcium 8.7 - 10.3 mg/dL 9.7 10.0 10.0   Hepatic Function Latest Ref Rng & Units 03/19/2018 10/20/2017 04/21/2017  Total Protein 6.0 - 8.5 g/dL 7.0 7.0 7.3  Albumin 3.5 - 4.8 g/dL 4.5 4.3 4.4  AST 0 - 40 IU/L 40 53(H) 50(H)  ALT 0 - 32 IU/L 23 27 35(H)  Alk Phosphatase 39 - 117 IU/L 50 46 43  Total Bilirubin 0.0 - 1.2 mg/dL 0.3 0.4 0.3  Bilirubin, Direct <=0.2 mg/dL - - -   CBC Latest Ref Rng & Units 04/05/2017 09/04/2015 06/07/2015  WBC 4.0 - 10.5 K/uL 8.0 10.2 8.4  Hemoglobin 12.0 - 15.0 g/dL 12.0 12.7 12.3  Hematocrit 36.0 - 46.0 % 35.8(L) 39.3 37.2  Platelets 150 - 400 K/uL 279 306 283   Lab Results  Component Value Date   MCV 91.8 04/05/2017   MCV 91.4 09/04/2015   MCV 90.1 06/07/2015   Lab Results  Component Value Date   TSH 2.088 09/05/2015   Lab Results  Component Value Date   HGBA1C 8.9 (H) 09/04/2015   Lipid Panel     Component Value Date/Time   CHOL 188 03/19/2018 0931   TRIG 228 (H) 03/19/2018 0931   HDL 48 03/19/2018 0931   CHOLHDL 3.9 03/19/2018 0931   CHOLHDL 4.7 09/08/2016 0910   VLDL 42 (H) 09/08/2016 0910   LDLCALC 94 03/19/2018 0931   Laboratory from Dr. Hughie Closs office from   February  22,017. Lipid studies revealed a total cholesterol 186, LDL cholesterol 80, triglycerides 333 and VLDL cholesterol 67. Her glucose was 283.   Laboratory from Dr. Hughie Closs office from 11/27/2014: Fasting glucose 220, BUN 16, creatinine 0.66.  CO2 30.5.  Hemoglobin 12.1, hematocrit 37.3.  Hemoglobin A1c 9.4.  Total cholesterol 204, triglycerides 261, HDL 44, LDL 108, VLDL 52.   RADIOLOGY: No results found.  IMPRESSION:  1. Paroxysmal atrial fibrillation (HCC)   2. CAD in native artery   3. Hx of CABG 2005   4. Essential hypertension   5. Sick sinus syndrome ([place])   6. Pacemaker   7. Chronic anticoagulation   8. Mixed hyperlipidemia   9. Amiodarone pulmonary toxicity   10. OSA on CPAP   11. Morbid obesity ([place])   12. Type 2 diabetes mellitus without complication, with long-term current use of insulin (HCC)     ASSESSMENT AND PLAN:   [name] is a 73 year old female who has a history of type 2 diabetes mellitus and is status post CABG surgery x2 by Dr. Roxy Manns in 2005. [name] has  a history of atrial fibrillation and underwent a Maze procedure at the time of her surgery.  [name] is status post permanent pacemaker insertion by Dr. [name].  [name] had been on amiodarone which ultimately was weaned and DC'd.  There is some concern that [name] may have possible pulmonary fibrosis and is under current evaluation with Dr. Valeta Harms with plans for follow-up pulmonary testing off amiodarone next week.  [name] is maintaining sinus rhythm and her ECG today shows that [name] is atrially paced.  Her blood pressure today is stable on her current regimen consisting of furosemide 40 mg twice a day, losartan 50 mg twice a day, metoprolol succinate 100 mg daily.  [name] continues to be on Eliquis for anticoagulation and denies bleeding.  [name] recently tolerated cataract surgery involving both eyes in early in mid June.  [name] is now on pravastatin 40 mg and Zetia 10 mg for hyperlipidemia with target LDL less than 70.  [name] is also  on the Vascepa in light of her mixed hyperlipidemia with history of significant triglyceride elevation.  [name] continues to use CPAP but on her most recent  download compliance was poor with only 37 days of usage.  AHI was excellent, when used at 0.2 at her 15 cm water pressure.  I again stressed the importance of continued CPAP therapy particularly with her history of atrial fibrillation and cardiovascular comorbidities.  [name] is diabetic on Humulin insulin in addition to metformin.  [name] has continued to have issues with peripheral neuropathy for which [name] is on gabapentin.  [name] recently had lab work last week by Dr. [name].  We will try to obtain these results.  I will see her in 6 months for follow-up evaluation   Time spent: 30 minutes  Troy Sine, MD, [facility] 09/17/2018 12:56 PM

## 2018-09-15 NOTE — Patient Instructions (Signed)
Medication Instructions:  The current medical regimen is effective;  continue present plan and medications.  If you need a refill on your cardiac medications before your next appointment, please call your pharmacy.   Follow-Up: At [facility], you and your health needs are our priority.  As part of our continuing mission to provide you with exceptional heart care, we have created designated Provider Care Teams.  These Care Teams include your primary Cardiologist (physician) and Advanced Practice Providers (APPs -  Physician Assistants and Nurse Practitioners) who all work together to provide you with the care you need, when you need it. You will need a follow up appointment in 6 months.  Please call our office 2 months in advance to schedule this appointment.  You may see Dr.Kelly or one of the following Advanced Practice Providers on your designated Care Team: Almyra Deforest, [place] . Fabian Sharp, PA-C

## 2018-09-16 NOTE — Telephone Encounter (Signed)
Called and spoke to [name], patient's daughter. They would like to cancel the PFT but still keep the office visit.  I also mentioned that the visit could be changed to a MyChart visit if needed.  Dawn stated she wants to keep the OV but will talk with her mother and give [place] a call back if they want to change the appointment type.  Nothing further needed at this time.

## 2018-09-16 NOTE — Telephone Encounter (Signed)
Called and spoke to patient's daughter about scheduling COVID test prior to PFT.  Dawn, the daughter who is listed on DPR, stated that her mother refuses to have the nasal swab done for COVID.  Wants to cancel PFT.  [place]'s question is does Dr. Valeta Harms still want to see patient without PFT?  Dr. Valeta Harms please advise.

## 2018-09-16 NOTE — Telephone Encounter (Signed)
PCCM: She will welcome to see me without the PFT. If she is concerned about coming to the office we can offer a televisit with me or next available APP.  Garner Nash, DO [facility] 09/16/2018 10:19 AM

## 2018-09-17 ENCOUNTER — Encounter: Payer: Self-pay | Admitting: Cardiovascular Disease

## 2018-09-23 ENCOUNTER — Encounter: Payer: Self-pay | Admitting: Nurse Practitioner

## 2018-09-23 ENCOUNTER — Telehealth (INDEPENDENT_AMBULATORY_CARE_PROVIDER_SITE_OTHER): Payer: Medicare Other | Admitting: Nurse Practitioner

## 2018-09-23 ENCOUNTER — Other Ambulatory Visit: Payer: Self-pay

## 2018-09-23 DIAGNOSIS — R0602 Shortness of breath: Secondary | ICD-10-CM | POA: Diagnosis not present

## 2018-09-23 DIAGNOSIS — J849 Interstitial pulmonary disease, unspecified: Secondary | ICD-10-CM | POA: Diagnosis not present

## 2018-09-23 NOTE — Assessment & Plan Note (Signed)
This has been a stable interval for patient.  She states that her shortness of breath is at baseline.  States that she has been doing well denies any new cough.  She would like to defer having a PFT done at this time due to current [place] pandemic.  We discussed possibly following up with Dr. Valeta Harms in around 3 months and having the PFT done at this time.  Patient Instructions  Continue current medications  Will need to complete PFT - possibly in around 3 months  Follow up: Follow up with Dr. Valeta Harms in 3 months or sooner if needed

## 2018-09-23 NOTE — Progress Notes (Signed)
Virtual Visit via Video Note  I connected with [name] on 09/23/18 at 12:00 PM EDT by a video enabled telemedicine application and verified that I am speaking with the correct person using two identifiers.  Location: Patient: home Provider: office   I discussed the limitations of evaluation and management by telemedicine and the availability of in person appointments. The patient expressed understanding and agreed to proceed.  History of Present Illness: 73 year old female with drug-induced ILD (amiodarone) who is followed by Dr. Valeta Harms.  Pets: None  Allergies: pollen seasonal  Occupation: cotton [employer] Hobbies: quilting  Travel: no recent travel  Sleep: OSA on CPAP  Patient has a tele-visit today for follow-up.  She was last seen by Dr. 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  Patient states that she did follow-up with cardiology and has stopped taking amiodarone.  She states that she has been doing well she denies any significant shortness of breath or cough.  She states that she still does get short of breath at times with exertion such as walking up hills.  She states that this is her baseline.  Dr. Valeta Harms had recommended repeat PFT but patient refuses to do this at this time due to current COVID pandemic.  She states that she is stable and would like to wait to have the PFT done in a few months. Denies f/c/s, n/v/d, hemoptysis, PND, leg swelling.    Observations/Objective: Chest Imaging: Chest x-ray 2017: Enlarged cardiac silhouette bilateral pulmonary edema  High-resolution CT of the chest December 2019: Patient with bilateral fibrotic interstitial lung disease, patchy groundglass opacities and reticulation without a clear apical basilar gradient.  Findings are new compared to prior CT imaging.  Consistent with possible drug-induced ILD from amiodarone  PFT Results Latest Ref Rng & Units 12/28/2017  FVC-Predicted Pre % 56  FVC-Post L  1.61  FVC-Predicted Post % 55  Pre FEV1/FVC % % 91  Post FEV1/FCV % % 91  FEV1-Pre L 1.49  FEV1-Predicted Pre % 68  FEV1-Post L 1.47  DLCO UNC% % 67  DLCO COR %Predicted % 121  TLC L 2.93  TLC % Predicted % 58  RV % Predicted % 52     Assessment and Plan: This has been a stable interval for patient.  She states that her shortness of breath is at baseline.  States that she has been doing well denies any new cough.  She would like to defer having a PFT done at this time due to current [place] pandemic.  We discussed possibly following up with Dr. Valeta Harms in around 3 months and having the PFT done at this time.  Patient Instructions  Continue current medications  Will need to complete PFT - possibly in around 3 months  Follow Up Instructions: Follow up with Dr. Valeta Harms in 3 months or sooner if needed   I discussed the assessment and treatment plan with the patient. The patient was provided an opportunity to ask questions and all were answered. The patient agreed with the plan and demonstrated an understanding of the instructions.   The patient was advised to call back or seek an in-person evaluation if the symptoms worsen or if the condition fails to improve as anticipated.  I provided 22 minutes of non-face-to-face time during this encounter.   Fenton Foy, NP

## 2018-09-23 NOTE — Patient Instructions (Signed)
Continue current medications  Will need to complete PFT - possibly in around 3 months  Follow up: Follow up with Dr. Valeta Harms in 3 months or sooner if needed

## 2018-09-24 ENCOUNTER — Ambulatory Visit (INDEPENDENT_AMBULATORY_CARE_PROVIDER_SITE_OTHER): Payer: Medicare Other | Admitting: *Deleted

## 2018-09-24 DIAGNOSIS — I495 Sick sinus syndrome: Secondary | ICD-10-CM

## 2018-09-24 DIAGNOSIS — I480 Paroxysmal atrial fibrillation: Secondary | ICD-10-CM

## 2018-09-24 LAB — CUP PACEART REMOTE DEVICE CHECK
Battery Remaining Longevity: 74 mo
Battery Voltage: 3.01 V
Brady Statistic AP VP Percent: 0.43 %
Brady Statistic AP VS Percent: 91.33 %
Brady Statistic AS VP Percent: 0.02 %
Brady Statistic AS VS Percent: 8.22 %
Brady Statistic RA Percent Paced: 91.51 %
Brady Statistic RV Percent Paced: 0.47 %
Date Time Interrogation Session: 20200710162119
Implantable Lead Implant Date: 20160321
Implantable Lead Implant Date: 20160321
Implantable Lead Location: 753859
Implantable Lead Location: 753860
Implantable Lead Model: 5076
Implantable Lead Model: 5076
Implantable Pulse Generator Implant Date: 20160321
Lead Channel Impedance Value: 361 Ohm
Lead Channel Impedance Value: 475 Ohm
Lead Channel Impedance Value: 513 Ohm
Lead Channel Impedance Value: 551 Ohm
Lead Channel Pacing Threshold Amplitude: 0.5 V
Lead Channel Pacing Threshold Amplitude: 0.75 V
Lead Channel Pacing Threshold Pulse Width: 0.4 ms
Lead Channel Pacing Threshold Pulse Width: 0.4 ms
Lead Channel Sensing Intrinsic Amplitude: 0.625 mV
Lead Channel Sensing Intrinsic Amplitude: 0.625 mV
Lead Channel Sensing Intrinsic Amplitude: 16.5 mV
Lead Channel Sensing Intrinsic Amplitude: 16.5 mV
Lead Channel Setting Pacing Amplitude: 2 V
Lead Channel Setting Pacing Amplitude: 2.5 V
Lead Channel Setting Pacing Pulse Width: 0.4 ms
Lead Channel Setting Sensing Sensitivity: 0.9 mV

## 2018-09-28 NOTE — Progress Notes (Signed)
Remote pacemaker transmission.

## 2018-10-08 NOTE — Progress Notes (Signed)
PCCM:  Thanks for seeing her.  Garner Nash, DO [facility] 10/08/2018 5:26 PM

## 2018-10-13 ENCOUNTER — Other Ambulatory Visit: Payer: Self-pay | Admitting: Cardiovascular Disease

## 2018-12-08 ENCOUNTER — Encounter: Payer: Self-pay | Admitting: Primary Care

## 2018-12-08 ENCOUNTER — Inpatient Hospital Stay (HOSPITAL_COMMUNITY)
Admission: EM | Admit: 2018-12-08 | Discharge: 2018-12-13 | DRG: 308 | Disposition: A | Discharge status: Home / Self Care | Payer: Medicare Other | Attending: Family Medicine | Admitting: Family Medicine

## 2018-12-08 ENCOUNTER — Emergency Department (HOSPITAL_COMMUNITY): Payer: Medicare Other

## 2018-12-08 ENCOUNTER — Other Ambulatory Visit: Payer: Self-pay

## 2018-12-08 ENCOUNTER — Ambulatory Visit (INDEPENDENT_AMBULATORY_CARE_PROVIDER_SITE_OTHER): Payer: Medicare Other | Admitting: Primary Care

## 2018-12-08 ENCOUNTER — Encounter (HOSPITAL_COMMUNITY): Payer: Self-pay

## 2018-12-08 ENCOUNTER — Telehealth: Payer: Self-pay | Admitting: Internal Medicine

## 2018-12-08 DIAGNOSIS — Z8249 Family history of ischemic heart disease and other diseases of the circulatory system: Secondary | ICD-10-CM

## 2018-12-08 DIAGNOSIS — G4733 Obstructive sleep apnea (adult) (pediatric): Secondary | ICD-10-CM | POA: Diagnosis present

## 2018-12-08 DIAGNOSIS — I5021 Acute systolic (congestive) heart failure: Secondary | ICD-10-CM | POA: Diagnosis not present

## 2018-12-08 DIAGNOSIS — Z823 Family history of stroke: Secondary | ICD-10-CM

## 2018-12-08 DIAGNOSIS — Z7901 Long term (current) use of anticoagulants: Secondary | ICD-10-CM | POA: Diagnosis not present

## 2018-12-08 DIAGNOSIS — R0602 Shortness of breath: Secondary | ICD-10-CM | POA: Diagnosis not present

## 2018-12-08 DIAGNOSIS — Z6836 Body mass index (BMI) 36.0-36.9, adult: Secondary | ICD-10-CM | POA: Diagnosis present

## 2018-12-08 DIAGNOSIS — I480 Paroxysmal atrial fibrillation: Principal | ICD-10-CM | POA: Diagnosis present

## 2018-12-08 DIAGNOSIS — Z951 Presence of aortocoronary bypass graft: Secondary | ICD-10-CM | POA: Diagnosis not present

## 2018-12-08 DIAGNOSIS — Z950 Presence of cardiac pacemaker: Secondary | ICD-10-CM | POA: Diagnosis not present

## 2018-12-08 DIAGNOSIS — I2781 Cor pulmonale (chronic): Secondary | ICD-10-CM | POA: Diagnosis present

## 2018-12-08 DIAGNOSIS — E119 Type 2 diabetes mellitus without complications: Secondary | ICD-10-CM | POA: Diagnosis not present

## 2018-12-08 DIAGNOSIS — Z8349 Family history of other endocrine, nutritional and metabolic diseases: Secondary | ICD-10-CM

## 2018-12-08 DIAGNOSIS — Z20828 Contact with and (suspected) exposure to other viral communicable diseases: Secondary | ICD-10-CM | POA: Diagnosis present

## 2018-12-08 DIAGNOSIS — Z87442 Personal history of urinary calculi: Secondary | ICD-10-CM | POA: Diagnosis not present

## 2018-12-08 DIAGNOSIS — J45909 Unspecified asthma, uncomplicated: Secondary | ICD-10-CM | POA: Diagnosis present

## 2018-12-08 DIAGNOSIS — I2510 Atherosclerotic heart disease of native coronary artery without angina pectoris: Secondary | ICD-10-CM | POA: Diagnosis present

## 2018-12-08 DIAGNOSIS — I083 Combined rheumatic disorders of mitral, aortic and tricuspid valves: Secondary | ICD-10-CM | POA: Diagnosis present

## 2018-12-08 DIAGNOSIS — Z9049 Acquired absence of other specified parts of digestive tract: Secondary | ICD-10-CM

## 2018-12-08 DIAGNOSIS — I4891 Unspecified atrial fibrillation: Secondary | ICD-10-CM

## 2018-12-08 DIAGNOSIS — Z794 Long term (current) use of insulin: Secondary | ICD-10-CM | POA: Diagnosis not present

## 2018-12-08 DIAGNOSIS — Z66 Do not resuscitate: Secondary | ICD-10-CM | POA: Diagnosis present

## 2018-12-08 DIAGNOSIS — E782 Mixed hyperlipidemia: Secondary | ICD-10-CM | POA: Diagnosis present

## 2018-12-08 DIAGNOSIS — J849 Interstitial pulmonary disease, unspecified: Secondary | ICD-10-CM | POA: Diagnosis not present

## 2018-12-08 DIAGNOSIS — I509 Heart failure, unspecified: Secondary | ICD-10-CM

## 2018-12-08 DIAGNOSIS — I10 Essential (primary) hypertension: Secondary | ICD-10-CM | POA: Diagnosis not present

## 2018-12-08 DIAGNOSIS — I110 Hypertensive heart disease with heart failure: Secondary | ICD-10-CM | POA: Diagnosis present

## 2018-12-08 DIAGNOSIS — Z8261 Family history of arthritis: Secondary | ICD-10-CM

## 2018-12-08 DIAGNOSIS — E1165 Type 2 diabetes mellitus with hyperglycemia: Secondary | ICD-10-CM | POA: Diagnosis present

## 2018-12-08 DIAGNOSIS — I5033 Acute on chronic diastolic (congestive) heart failure: Secondary | ICD-10-CM | POA: Diagnosis present

## 2018-12-08 DIAGNOSIS — E6609 Other obesity due to excess calories: Secondary | ICD-10-CM | POA: Diagnosis present

## 2018-12-08 DIAGNOSIS — Z833 Family history of diabetes mellitus: Secondary | ICD-10-CM

## 2018-12-08 DIAGNOSIS — Z7982 Long term (current) use of aspirin: Secondary | ICD-10-CM

## 2018-12-08 DIAGNOSIS — Z8673 Personal history of transient ischemic attack (TIA), and cerebral infarction without residual deficits: Secondary | ICD-10-CM

## 2018-12-08 DIAGNOSIS — E1169 Type 2 diabetes mellitus with other specified complication: Secondary | ICD-10-CM | POA: Diagnosis not present

## 2018-12-08 DIAGNOSIS — E785 Hyperlipidemia, unspecified: Secondary | ICD-10-CM

## 2018-12-08 DIAGNOSIS — Z8542 Personal history of malignant neoplasm of other parts of uterus: Secondary | ICD-10-CM

## 2018-12-08 DIAGNOSIS — Z6839 Body mass index (BMI) 39.0-39.9, adult: Secondary | ICD-10-CM | POA: Diagnosis not present

## 2018-12-08 DIAGNOSIS — J209 Acute bronchitis, unspecified: Secondary | ICD-10-CM | POA: Diagnosis not present

## 2018-12-08 DIAGNOSIS — Z79899 Other long term (current) drug therapy: Secondary | ICD-10-CM

## 2018-12-08 DIAGNOSIS — Z90711 Acquired absence of uterus with remaining cervical stump: Secondary | ICD-10-CM | POA: Diagnosis not present

## 2018-12-08 DIAGNOSIS — I77810 Thoracic aortic ectasia: Secondary | ICD-10-CM | POA: Diagnosis present

## 2018-12-08 DIAGNOSIS — Z9119 Patient's noncompliance with other medical treatment and regimen: Secondary | ICD-10-CM

## 2018-12-08 LAB — CBC
HCT: 36.7 % (ref 36.0–46.0)
Hemoglobin: 12 g/dL (ref 12.0–15.0)
MCH: 30.7 pg (ref 26.0–34.0)
MCHC: 32.7 g/dL (ref 30.0–36.0)
MCV: 93.9 fL (ref 80.0–100.0)
Platelets: 286 10*3/uL (ref 150–400)
RBC: 3.91 MIL/uL (ref 3.87–5.11)
RDW: 15.3 % (ref 11.5–15.5)
WBC: 9.5 10*3/uL (ref 4.0–10.5)
nRBC: 0 % (ref 0.0–0.2)

## 2018-12-08 LAB — BASIC METABOLIC PANEL
Anion gap: 13 (ref 5–15)
BUN: 14 mg/dL (ref 8–23)
CO2: 26 mmol/L (ref 22–32)
Calcium: 9.2 mg/dL (ref 8.9–10.3)
Chloride: 97 mmol/L — ABNORMAL LOW (ref 98–111)
Creatinine, Ser: 1 mg/dL (ref 0.44–1.00)
GFR calc Af Amer: >60 mL/min (ref >60)
GFR calc non Af Amer: 56 mL/min — ABNORMAL LOW (ref >60)
Glucose, Bld: 219 mg/dL — ABNORMAL HIGH (ref 70–99)
Potassium: 3.5 mmol/L (ref 3.5–5.1)
Sodium: 136 mmol/L (ref 135–145)

## 2018-12-08 LAB — TROPONIN I (HIGH SENSITIVITY)
Troponin I (High Sensitivity): 9 ng/L (ref <18)
Troponin I (High Sensitivity): 11 ng/L (ref <18)

## 2018-12-08 LAB — SARS CORONAVIRUS 2 BY RT PCR (HOSPITAL ORDER, PERFORMED IN ~~LOC~~ HOSPITAL LAB): SARS Coronavirus 2: NEGATIVE

## 2018-12-08 LAB — BRAIN NATRIURETIC PEPTIDE: B Natriuretic Peptide: 110.5 pg/mL — ABNORMAL HIGH (ref 0.0–100.0)

## 2018-12-08 LAB — CBG MONITORING, ED: Glucose-Capillary: 172 mg/dL — ABNORMAL HIGH (ref 70–99)

## 2018-12-08 MED ORDER — FENOFIBRATE 160 MG PO TABS
160.0000 mg | ORAL_TABLET | Freq: Every day | ORAL | Status: DC
  Administered 2018-12-09 – 2018-12-13 (×5): 160 mg via ORAL
  Filled 2018-12-08 (×5): qty 1

## 2018-12-08 MED ORDER — SODIUM CHLORIDE 0.9% FLUSH: 3.0000 mL | Freq: Once | INTRAVENOUS | Status: DC

## 2018-12-08 MED ORDER — ASPIRIN 81 MG PO CHEW
81.0000 mg | CHEWABLE_TABLET | Freq: Every day | ORAL | Status: DC
  Administered 2018-12-08 – 2018-12-13 (×6): 81 mg via ORAL
  Filled 2018-12-08 (×6): qty 1

## 2018-12-08 MED ORDER — LOSARTAN POTASSIUM 50 MG PO TABS
50.0000 mg | ORAL_TABLET | Freq: Every day | ORAL | Status: DC
  Administered 2018-12-09: 50 mg via ORAL
  Filled 2018-12-08: qty 1

## 2018-12-08 MED ORDER — FUROSEMIDE 10 MG/ML IJ SOLN
80.0000 mg | INTRAMUSCULAR | Status: AC
  Administered 2018-12-08: 80 mg via INTRAVENOUS
  Filled 2018-12-08: qty 8

## 2018-12-08 MED ORDER — DILTIAZEM HCL-DEXTROSE 100-5 MG/100ML-% IV SOLN (PREMIX)
20.0000 mg/h | INTRAVENOUS | Status: DC
  Administered 2018-12-08: 19:00:00 5 mg/h via INTRAVENOUS
  Administered 2018-12-09 (×2): 15 mg/h via INTRAVENOUS
  Administered 2018-12-09 – 2018-12-10 (×3): 20 mg/h via INTRAVENOUS
  Filled 2018-12-08 (×8): qty 100

## 2018-12-08 MED ORDER — EZETIMIBE 10 MG PO TABS
10.0000 mg | ORAL_TABLET | Freq: Every day | ORAL | Status: DC
  Administered 2018-12-09 – 2018-12-13 (×5): 10 mg via ORAL
  Filled 2018-12-08 (×5): qty 1

## 2018-12-08 MED ORDER — DILTIAZEM HCL 25 MG/5ML IV SOLN
15.0000 mg | Freq: Once | INTRAVENOUS | Status: AC
  Administered 2018-12-08: 18:00:00 15 mg via INTRAVENOUS
  Filled 2018-12-08: qty 5

## 2018-12-08 MED ORDER — INSULIN ASPART 100 UNIT/ML ~~LOC~~ SOLN
0.0000 [IU] | Freq: Three times a day (TID) | SUBCUTANEOUS | Status: DC
  Administered 2018-12-09: 8 [IU] via SUBCUTANEOUS
  Administered 2018-12-09: 11 [IU] via SUBCUTANEOUS
  Administered 2018-12-09: 15 [IU] via SUBCUTANEOUS
  Administered 2018-12-10: 8 [IU] via SUBCUTANEOUS
  Administered 2018-12-10 (×2): 5 [IU] via SUBCUTANEOUS
  Administered 2018-12-11: 12:00:00 3 [IU] via SUBCUTANEOUS
  Administered 2018-12-11: 16:00:00 8 [IU] via SUBCUTANEOUS
  Administered 2018-12-11: 3 [IU] via SUBCUTANEOUS
  Administered 2018-12-12: 16:00:00 8 [IU] via SUBCUTANEOUS
  Administered 2018-12-12: 3 [IU] via SUBCUTANEOUS
  Administered 2018-12-12: 2 [IU] via SUBCUTANEOUS
  Administered 2018-12-13: 08:00:00 5 [IU] via SUBCUTANEOUS
  Administered 2018-12-13: 11 [IU] via SUBCUTANEOUS

## 2018-12-08 MED ORDER — DOXYCYCLINE HYCLATE 100 MG PO TABS: 100.0000 mg | ORAL_TABLET | Freq: Two times a day (BID) | ORAL | 0 refills | Status: DC

## 2018-12-08 MED ORDER — POTASSIUM CHLORIDE CRYS ER 10 MEQ PO TBCR
10.0000 meq | EXTENDED_RELEASE_TABLET | Freq: Every morning | ORAL | Status: DC
  Administered 2018-12-09: 09:00:00 10 meq via ORAL
  Filled 2018-12-08: qty 1

## 2018-12-08 MED ORDER — ACETAMINOPHEN 500 MG PO TABS
500.0000 mg | ORAL_TABLET | Freq: Four times a day (QID) | ORAL | Status: DC | PRN
  Administered 2018-12-10: 1000 mg via ORAL
  Filled 2018-12-08: qty 2

## 2018-12-08 MED ORDER — INSULIN DETEMIR 100 UNIT/ML ~~LOC~~ SOLN
25.0000 [IU] | Freq: Two times a day (BID) | SUBCUTANEOUS | Status: DC
  Administered 2018-12-08 – 2018-12-09 (×3): 25 [IU] via SUBCUTANEOUS
  Filled 2018-12-08 (×5): qty 0.25

## 2018-12-08 MED ORDER — APIXABAN 5 MG PO TABS
5.0000 mg | ORAL_TABLET | Freq: Two times a day (BID) | ORAL | Status: DC
  Administered 2018-12-08 – 2018-12-13 (×10): 5 mg via ORAL
  Filled 2018-12-08 (×10): qty 1

## 2018-12-08 NOTE — Progress Notes (Signed)
Virtual Visit via Telephone Note  I connected with [name] on 12/08/18 at  2:00 PM EDT by telephone and verified that I am speaking with the correct person using two identifiers.  Location: Patient: Home Provider: Office   I discussed the limitations, risks, security and privacy concerns of performing an evaluation and management service by telephone and the availability of in person appointments. I also discussed with the patient that there may be a patient responsible charge related to this service. The patient expressed understanding and agreed to proceed.   History of Present Illness: 73 year old female, never smoked. PMH significant for ILD, amiodarone pulmonary toxicity, restrictive lung disease, sleep apnea (not on cpap), type 2 diabetes, hypertension, paroxysmal afib. Patient of Dr. Valeta Harms, last seen by pulmonary NP on 09/23/18 for video visit. Needs follow-up with PFTs in 3 months.   12/08/2018  Patient contacted today for televist with reports of increased shortness of breath and cough for the last several days. Reports that her heart rate is in the 120's. She is off amiodarone. Her weight is up 5-8 lbs and she has some foot swelling. Taking lasix 34m daily. Cough is productive with yellow mucus. Associated nasal congestion and seasonal allergies. She is not currently taking anything for cough or oral antihistamine.    Observations/Objective:  Patient reported vitals: BP 125/70s HR 128 Temp 97.9  Chest Imaging: Chest x-ray 2017: Enlarged cardiac silhouette bilateral pulmonary edema  High-resolution CT of the chest December 2019: Patient with bilateral fibrotic interstitial lung disease, patchy groundglass opacities and reticulation without a clear apical basilar gradient. Findings are new compared to prior CT imaging. Consistent with possible drug-induced ILD from amiodarone   Assessment and Plan:  Paroxysmal afib - HR in 120s - Off amiodarone - Patient needs  to contact cardiology  Congestive heart failure - Possible CHF exacerbation - Weight is up 5-8lbs - Currently taking lasix 861mdaily - Patient needs to contact cardiology   Bronchitis - Cough with yellow mucus likely d/t season allergies - No signs of bacterial infection. Afebrile.  - Advised patient start zyrtec 1055maily, flonase and mucinex twice daily - Will send in RX doxycyline to have on hand if not improvement with above recommendations   Follow Up Instructions:   - Follow up if symptoms do not improve or if unable to be seen by cardiology team  I discussed the assessment and treatment plan with the patient. The patient was provided an opportunity to ask questions and all were answered. The patient agreed with the plan and demonstrated an understanding of the instructions.   The patient was advised to call back or seek an in-person evaluation if the symptoms worsen or if the condition fails to improve as anticipated.  I provided 22 minutes of non-face-to-face time during this encounter.   EliMartyn EhrichP

## 2018-12-08 NOTE — ED Triage Notes (Signed)
Patient complains of 2 days of increased SOB. States that she has increased edema and feels as if she is smothering. Taking her lasix with no relief. On arrival found to be in rapid atrial fib at 150. No CP. Alert and oriented, tachypnea

## 2018-12-08 NOTE — Telephone Encounter (Signed)
New Message    Pt c/o Shortness Of Breath: STAT if SOB developed within the last 24 hours or pt is noticeably SOB on the phone  1. Are you currently SOB (can you hear that pt is SOB on the phone)? Yes  2. How long have you been experiencing SOB? 2 to 3 days   3. Are you SOB when sitting or when up moving around? Yes   4. Are you currently experiencing any other symptoms? Cough, patient can't lay down, and HR is 128.

## 2018-12-08 NOTE — ED Provider Notes (Signed)
[facility] EMERGENCY DEPARTMENT Provider Note   CSN: [id number] Arrival date & time: 12/08/18  1614     History   Chief Complaint Chief Complaint  Patient presents with   SOB/ CHF/ atrial fib    HPI [name] is a 73 y.o. female.     73 year old female with past medical history including CAD status post CABG, CHF, atrial fibrillation on Eliquis, OSA, pacemaker, interstitial lung disease who presents with shortness of breath.  Patient reports that she has had 2 days of progressively worsening shortness of breath.  Shortness of breath is worse with exertion as well as laying flat.  She has noticed increased lower extremity edema despite being compliant with all of her medications including Lasix.  She has not had as much urination after taking the medication as she usually does.  She has not noticed any chest pain or heart racing sensation.  She has a chronic cough usually in the mornings and then at night that has not changed recently.  No fevers, vomiting, diarrhea, urinary symptoms, or sick contacts.  The history is provided by the patient.    Past Medical History:  Diagnosis Date   Asthmatic bronchitis    CHF (congestive heart failure) ([place]) dx'd 2016   Coronary atherosclerosis of native coronary artery    a. s/p CABG 2005 Dr Roxy Manns     Hyperlipidemia    Hypertension    Hypertensive heart disease    Kidney stones    Mixed hyperlipidemia    Paroxysmal atrial fibrillation ([place])    a. s/p Maze in 2005 b. recurrence in March 2016 with >8sec posttermination pauses s/p PPM implant     Pneumonia 1950   "double"   Presence of permanent cardiac pacemaker    Sleep apnea    "tested; mask ordered; couldn't afford it; will get it now" (09/04/2015)   TIA (transient ischemic attack)    Type II diabetes mellitus ([place])    Uterine cancer [facility])    age 79 with partial hysterectomy    Patient Active Problem List   Diagnosis Date Noted   ILD  (interstitial lung disease) ([place]) 09/23/2018   Amiodarone pulmonary toxicity 03/03/2018   Restrictive lung disease 03/03/2018   Palpitations 06/07/2015   Pacemaker- March 2016  06/07/2015   Chronic anticoagulation 06/07/2015   Sleep apnea-unable to tolerate C-pap 06/07/2015   Nephrolithiasis 06/07/2015   Poorly controlled diabetes mellitus ([place]) 12/02/2014   Ureteral stone 09/08/2014   Preop cardiovascular exam 09/05/2014   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 06/05/2014   History of uterine cancer 06/04/2014   Personal history of transient ischemic attack (TIA) and cerebral infarction without residual deficit    Hypertensive heart disease with heart failure ([place])    Hx of CABG 2005 09/28/2012   Type 2 diabetes mellitus ([place]) 09/28/2012   Paroxysmal atrial fibrillation ([place]) 09/28/2012   Morbid obesity ([place]) 09/28/2012   Mixed hyperlipidemia 09/28/2012    Past Surgical History:  Procedure Laterality Date   CARDIAC CATHETERIZATION  2005   CORONARY ARTERY BYPASS GRAFT  2005   Dr Roxy Manns with MAZE   CYSTOSCOPY WITH URETEROSCOPY, STONE BASKETRY AND STENT PLACEMENT Left 09/08/2014   Procedure: CYSTOSCOPY WITH URETEROSCOPY, STONE BASKETRY AND STENT PLACEMENT;  Surgeon: Cleon Gustin, MD;  Location: [facility];  Service: Urology;  Laterality: Left;   CYSTOSCOPY/URETEROSCOPY/HOLMIUM LASER/STENT PLACEMENT Left 09/15/2014   Procedure: CYSTOSCOPY/RETROGRADE/URETEROSCOPY/HOLMIUM LASER/ STONE EXTRACTION /STENT EXCHANGE;  Surgeon: Cleon Gustin, MD;  Location: [facility];  Service: Urology;  Laterality: Left;   HOLMIUM LASER APPLICATION Left 1/61/0960   Procedure: HOLMIUM LASER APPLICATION;  Surgeon: Cleon Gustin, MD;  Location: [facility];  Service: Urology;  Laterality: Left;   HOLMIUM LASER APPLICATION Left 06/19/4096   Procedure: HOLMIUM LASER APPLICATION;  Surgeon: Cleon Gustin, MD;  Location: [facility];  Service: Urology;  Laterality: Left;   INSERT / REPLACE /  REMOVE PACEMAKER     LAPAROSCOPIC CHOLECYSTECTOMY     PERMANENT PACEMAKER INSERTION N/A 06/05/2014   MDT Advisa 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      Home Medications    Prior to Admission medications   Medication Sig Start Date End Date Taking? Authorizing Provider  acetaminophen (TYLENOL) 500 MG tablet Take 500-1,000 mg by mouth every 6 (six) hours as needed for moderate pain.    [provider]  apixaban (ELIQUIS) 5 MG TABS tablet Take 1 tablet (5 mg total) by mouth 2 (two) times daily. Take 1st dose 06-07-14 06/06/14   Chanetta Marshall K, NP  aspirin 81 MG tablet Take 81 mg by mouth daily.     [provider]  B Complex-Biotin-FA (B-COMPLEX PO) Take 1 capsule by mouth every morning.     [provider]  BIOTIN PO Take 1 tablet by mouth daily.    [provider]  Cholecalciferol (VITAMIN D3) 1000 UNITS CAPS Take 1,000 Units by mouth daily.    [provider]  Coenzyme Q10 (CO Q-10) 200 MG CAPS Take 200 mg by mouth daily.    [provider]  diphenhydramine-acetaminophen (TYLENOL PM) 25-500 MG TABS tablet Take 1 tablet by mouth at bedtime as needed (sleep).    [provider]  doxycycline (VIBRA-TABS) 100 MG tablet Take 1 tablet (100 mg total) by mouth 2 (two) times daily. 12/08/18   Martyn Ehrich, NP  ezetimibe (ZETIA) 10 MG tablet Take 10 mg by mouth daily.    [provider]  fenofibrate 160 MG tablet TAKE 1 TABLET DAILY 10/14/18   Troy Sine, MD  furosemide (LASIX) 40 MG tablet Take 2 tablets (80 mg total) by mouth daily. 12/25/17 03/25/18  Evans Lance, MD  GuaiFENesin (TUSSIN PO) Take by mouth 2 (two) times daily as needed (Cough/congestion). Pt unsure of strength    [provider]  HUMULIN 70/30 KWIKPEN (70-30) 100 UNIT/ML PEN Inject 35 Units into the muscle 2 (two) times daily  at 8 am and 10 pm. 01/14/18   [provider]  Icosapent Ethyl (VASCEPA) 1 g CAPS Take 2 capsules (2 g total) by mouth 2 (two) times daily. 03/22/18   Troy Sine, MD  losartan (COZAAR) 50 MG tablet TAKE 1 TABLET UPTO TWICE A DAY AS DIRECTED 08/26/18   Troy Sine, MD  metFORMIN (GLUCOPHAGE) 500 MG tablet Take 500 mg by mouth 2 (two) times daily with a meal.     [provider]  metoprolol succinate (TOPROL-XL) 100 MG 24 hr tablet Take 1 tablet daily with or immediatelyfollowing a meal. 02/19/17   Troy Sine, MD  NEOMYCIN-POLYMYXIN-HYDROCORTISONE (CORTISPORIN) 1 % SOLN otic solution Place 4 drops into both ears 2 (two) times daily as needed. For pain and itching 04/04/14   [provider]  Omega-3 Fatty Acids (FISH OIL PO) Take 2 capsules by mouth 2 (two) times daily.    [provider]  potassium chloride (K-DUR,KLOR-CON) 10 MEQ tablet Take 10 mEq by mouth every morning.     [provider]  Semaglutide (RYBELSUS) 3 MG TABS Take by mouth.    [provider]    Family History Family History  Problem Relation Age of Onset   Hypertension Mother    Hyperlipidemia Mother    Heart disease Mother    Diabetes Father    Stroke Father    Heart disease Father    Hypertension Sister        3 sisters   Arthritis Brother    Heart attack Maternal Grandfather    Stroke Paternal Grandfather    Hypertension Daughter     Social History Social History   Tobacco Use   Smoking status: Never Smoker   Smokeless tobacco: Never Used  Substance Use Topics   Alcohol use: No   Drug use: No     Allergies   Patient has no known allergies.   Review of Systems Review of Systems All other systems reviewed and are negative except that which was mentioned in HPI   Physical Exam Updated Vital Signs BP (!) 139/92    Pulse (!) 124    Temp 98.1 F (36.7 C) (Oral)    Resp (!) 25    SpO2 97%   Physical Exam Vitals signs and  nursing note reviewed.  Constitutional:      General: She is not in acute distress.    Appearance: She is well-developed.  HENT:     Head: Normocephalic and atraumatic.  Eyes:     Conjunctiva/sclera: Conjunctivae normal.  Neck:     Musculoskeletal: Neck supple.  Cardiovascular:     Rate and Rhythm: Tachycardia present. Rhythm irregular.     Heart sounds: Normal heart sounds.  Pulmonary:     Comments: Dyspneic while talking but no severe distress, diminished breath sounds b/l Abdominal:     General: Bowel sounds are normal. There is no distension.     Palpations: Abdomen is soft.     Tenderness: There is no abdominal tenderness.  Musculoskeletal:     Right lower leg: Edema present.     Left lower leg: Edema present.  Skin:    General: Skin is warm and dry.  Neurological:     Mental Status: She is alert and oriented to person, place, and time.     Comments: Fluent speech  Psychiatric:        Judgment: Judgment normal.      ED Treatments / Results  Labs (all labs ordered are listed, but only abnormal results are displayed) Labs Reviewed  BASIC METABOLIC PANEL - Abnormal; Notable for the following components:      Result Value   Chloride 97 (*)    Glucose, Bld 219 (*)    GFR calc non Af Amer 56 (*)    All other components within normal limits  BRAIN NATRIURETIC PEPTIDE - Abnormal; Notable for the following components:   B Natriuretic Peptide 110.5 (*)    All other components within normal limits  SARS CORONAVIRUS 2 (HOSPITAL ORDER, [place] [facility])  CBC  TROPONIN I (HIGH SENSITIVITY)  TROPONIN I (HIGH SENSITIVITY)    EKG EKG Interpretation  Date/Time:  Wednesday December 08 2018 17:30:13 EDT Ventricular Rate:  125 PR Interval:    QRS Duration: 104 QT Interval:  311 QTC Calculation: 449 R Axis:   123 Text Interpretation:  Right and left arm electrode reversal, interpretation assumes  no reversal Junctional tachycardia Right axis deviation  Low voltage, precordial leads Borderline repolarization abnormality suspect A fib with RVR Confirmed by Theotis Burrow [phone]) on 12/08/2018 6:06:52 PM   Radiology Dg Chest 2 View  Result Date: 12/08/2018 CLINICAL DATA:  Shortness of breath and cough for 3 days EXAM: CHEST - 2 VIEW COMPARISON:  Chest x-ray 09/04/2015 and chest CT 02/18/2018. FINDINGS: The heart is enlarged. Stable surgical changes from bypass surgery. There is tortuosity and calcification of the thoracic aorta. Chronic interstitial lung disease as demonstrated on prior chest films and CT scan. Findings may be slightly progressive. No focal infiltrates or effusions. No worrisome pulmonary lesions. The pacer wires are stable. The bony structures are intact. IMPRESSION: 1. Stable mild cardiac enlargement. 2. Persistent interstitial lung disease with possible progression. 3. No infiltrates or effusions. Electronically Signed   By: Marijo Sanes M.D.   On: 12/08/2018 17:26    Procedures .Critical Care Performed by: Sharlett Iles, MD Authorized by: Sharlett Iles, MD   Critical care provider statement:    Critical care time (minutes):  30   Critical care time was exclusive of:  Separately billable procedures and treating other patients   Critical care was necessary to treat or prevent imminent or life-threatening deterioration of the following conditions:  Cardiac failure   Critical care was time spent personally by me on the following activities:  Development of treatment plan with patient or surrogate, evaluation of patient's response to treatment, examination of patient, obtaining history from patient or surrogate, review of old charts, re-evaluation of patient's condition, ordering and review of radiographic studies, ordering and review of laboratory studies and ordering and performing treatments and interventions   (including critical care time)  Medications Ordered in ED Medications  sodium chloride flush (NS)  0.9 % injection 3 mL (has no administration in time range)  diltiazem (CARDIZEM) 100 mg in dextrose 5% 139m (1 mg/mL) infusion (5 mg/hr Intravenous New Bag/Given 12/08/18 1916)  furosemide (LASIX) injection 80 mg (80 mg Intravenous Given 12/08/18 1744)  diltiazem (CARDIZEM) injection 15 mg (15 mg Intravenous Given 12/08/18 1744)     Initial Impression / Assessment and Plan / ED Course  I have reviewed the triage vital signs and the nursing notes.  Pertinent labs & imaging results that were available during my care of the patient were reviewed by me and considered in my medical decision making (see chart for details).        Dyspneic but non-toxic on exam, O2 sat 96%, BP 144/76, HR variable 120s and occasionally up to 150s.  Patient was not aware of heart racing, it is possible that she has been in A. fib with RVR without knowing it which may have led to CHF exacerbation.  She is compliant with Eliquis therefore PE less likely.  Gave IV Lasix as well as diltiazem bolus.  Lab work shows normal troponin, BNP 110, glucose 219, normal creatinine, normal anion gap, normal CBC.  Chest x-ray shows interstitial lung disease changes.  I reviewed patient note with pulmonologist this week and she noted a 5 to 8 pound weight gain.  Coupled with her lower extremity edema, orthopnea, and exertional dyspnea, I suspect that she is volume overloaded.  She did not significantly improve with diltiazem bolus therefore initiated diltiazem drip.  Discussed admission with [facility], Dr. TFlossie Buffy and pt admitted for further care.  Final Clinical Impressions(s) / ED Diagnoses   Final diagnoses:  Atrial fibrillation with RVR (HCC)  Acute on chronic  congestive heart failure, unspecified heart failure type [facility])    ED Discharge Orders    None       Franciszek Platten, Wenda Overland, MD 12/08/18 1921

## 2018-12-08 NOTE — ED Notes (Signed)
ED TO INPATIENT HANDOFF REPORT  ED Nurse Name and Phone #: Annie Main [phone]  E Name/Age/Gender [name] 73 y.o. female Room/Bed: 037C/037C  Code Status   Code Status: DNR  Home/SNF/Other Home Patient oriented to: self, place, time and situation Is this baseline? Yes   Triage Complete: Triage complete  Chief Complaint SHOB, tachy  Triage Note Patient complains of 2 days of increased SOB. States that she has increased edema and feels as if she is smothering. Taking her lasix with no relief. On arrival found to be in rapid atrial fib at 150. No CP. Alert and oriented, tachypnea    Allergies No Known Allergies  Level of Care/Admitting Diagnosis ED Disposition    ED Disposition Condition Comment   Admit  Hospital Area: [facility] [[id number]]  Level of Care: Telemetry Cardiac [103]  Covid Evaluation: Asymptomatic Screening Protocol (No Symptoms)  Diagnosis: Atrial fibrillation with [facility] [092330]  Admitting Physician: Orene Desanctis [[id number]]  Attending Physician: Orene Desanctis [[id number]]  Estimated length of stay: past midnight tomorrow  Certification:: I certify this patient will need inpatient services for at least 2 midnights  PT Class (Do Not Modify): Inpatient [101]  PT Acc Code (Do Not Modify): Private [1]       B Medical/Surgery History Past Medical History:  Diagnosis Date  . Asthmatic bronchitis   . CHF (congestive heart failure) ([place]) dx'd 2016  . Coronary atherosclerosis of native coronary artery    a. s/p CABG 2005 Dr Roxy Manns    . Hyperlipidemia   . Hypertension   . Hypertensive heart disease   . Kidney stones   . Mixed hyperlipidemia   . Paroxysmal atrial fibrillation ([place])    a. s/p Maze in 2005 b. recurrence in March 2016 with >8sec posttermination pauses s/p PPM implant    . Pneumonia 1950   "double"  . Presence of permanent cardiac pacemaker   . Sleep apnea    "tested; mask ordered; couldn't afford it; will get it now"  (09/04/2015)  . TIA (transient ischemic attack)   . Type II diabetes mellitus ([place])   . Uterine cancer [facility])    age 89 with partial hysterectomy   Past Surgical History:  Procedure Laterality Date  . CARDIAC CATHETERIZATION  2005  . CORONARY ARTERY BYPASS GRAFT  2005   Dr Roxy Manns with MAZE  . CYSTOSCOPY WITH URETEROSCOPY, STONE BASKETRY AND STENT PLACEMENT Left 09/08/2014   Procedure: CYSTOSCOPY WITH URETEROSCOPY, STONE BASKETRY AND STENT PLACEMENT;  Surgeon: Cleon Gustin, MD;  Location: [facility];  Service: Urology;  Laterality: Left;  . CYSTOSCOPY/URETEROSCOPY/HOLMIUM LASER/STENT PLACEMENT Left 09/15/2014   Procedure: CYSTOSCOPY/RETROGRADE/URETEROSCOPY/HOLMIUM LASER/ STONE EXTRACTION /STENT EXCHANGE;  Surgeon: Cleon Gustin, MD;  Location: [facility];  Service: Urology;  Laterality: Left;  . HOLMIUM LASER APPLICATION Left 5/63/8937   Procedure: HOLMIUM LASER APPLICATION;  Surgeon: Cleon Gustin, MD;  Location: [facility];  Service: Urology;  Laterality: Left;  . HOLMIUM LASER APPLICATION Left 05/19/2874   Procedure: HOLMIUM LASER APPLICATION;  Surgeon: Cleon Gustin, MD;  Location: [facility];  Service: Urology;  Laterality: Left;  . INSERT / REPLACE / REMOVE PACEMAKER    . LAPAROSCOPIC CHOLECYSTECTOMY    . 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 VAGINAL HYSTERECTOMY  1975     A IV Location/Drains/Wounds Patient Lines/Drains/Airways Status   Active Line/Drains/Airways    Name:   Placement date:  Placement time:   Site:   Days:   Peripheral IV 12/08/18 Right Antecubital   12/08/18    1714    Antecubital   less than 1   Ureteral Drain/Stent Left ureter 6 Fr.   09/15/14    1453    Left ureter   1545   Incision (Closed) 06/06/14 Chest Left   06/06/14    0920     1646   Incision (Closed) 09/08/14 N/A   09/08/14    1728     1552          Intake/Output Last 24 hours No intake or output data in  the 24 hours ending 12/08/18 2117  Labs/Imaging Results for orders placed or performed during the hospital encounter of 12/08/18 (from the past 48 hour(s))  Basic metabolic panel     Status: Abnormal   Collection Time: 12/08/18  4:41 PM  Result Value Ref Range   Sodium 136 135 - 145 mmol/L   Potassium 3.5 3.5 - 5.1 mmol/L   Chloride 97 (L) 98 - 111 mmol/L   CO2 26 22 - 32 mmol/L   Glucose, Bld 219 (H) 70 - 99 mg/dL   BUN 14 8 - 23 mg/dL   Creatinine, Ser 1.00 0.44 - 1.00 mg/dL   Calcium 9.2 8.9 - 10.3 mg/dL   GFR calc non Af Amer 56 (L) >60 mL/min   GFR calc Af Amer >60 >60 mL/min   Anion gap 13 5 - 15    Comment: Performed at [facility], [place] [address] [place], [place] [address]  CBC     Status: None   Collection Time: 12/08/18  4:41 PM  Result Value Ref Range   WBC 9.5 4.0 - 10.5 K/uL   RBC 3.91 3.87 - 5.11 MIL/uL   Hemoglobin 12.0 12.0 - 15.0 g/dL   HCT 36.7 36.0 - 46.0 %   MCV 93.9 80.0 - 100.0 fL   MCH 30.7 26.0 - 34.0 pg   MCHC 32.7 30.0 - 36.0 g/dL   RDW 15.3 11.5 - 15.5 %   Platelets 286 150 - 400 K/uL   nRBC 0.0 0.0 - 0.2 %    Comment: Performed at [facility], [place] [address] [place], [place] [address]  Troponin I (High Sensitivity)     Status: None   Collection Time: 12/08/18  4:41 PM  Result Value Ref Range   Troponin I (High Sensitivity) 9 <18 ng/L    Comment: (NOTE) Elevated high sensitivity troponin I (hsTnI) values and significant  changes across serial measurements may suggest ACS but many other  chronic and acute conditions are known to elevate hsTnI results.  Refer to the "Links" section for chest pain algorithms and additional  guidance. Performed at [facility], [place] [address] [place], [place] [address]   Brain natriuretic peptide     Status: Abnormal   Collection Time: 12/08/18  4:53 PM  Result Value Ref Range   B Natriuretic Peptide 110.5 (H) 0.0 - 100.0 pg/mL    Comment: Performed at [facility] [address] [place], [place] [address]  SARS Coronavirus 2 [facility] order, Performed in [facility] hospital lab) Nasopharyngeal Nasopharyngeal Swab     Status: None   Collection Time: 12/08/18  6:30 PM   Specimen: Nasopharyngeal Swab  Result Value Ref Range   SARS Coronavirus 2 NEGATIVE NEGATIVE    Comment: (NOTE) If result is NEGATIVE SARS-CoV-2 target nucleic acids are NOT  DETECTED. The SARS-CoV-2 RNA is generally detectable in upper and lower  respiratory specimens during the acute phase of infection. The lowest  concentration of SARS-CoV-2 viral copies this assay can detect is 250  copies / mL. A negative result does not preclude SARS-CoV-2 infection  and should not be used as the sole basis for treatment or other  patient management decisions.  A negative result may occur with  improper specimen collection / handling, submission of specimen other  than nasopharyngeal swab, presence of viral mutation(s) within the  areas targeted by this assay, and inadequate number of viral copies  (<250 copies / mL). A negative result must be combined with clinical  observations, patient history, and epidemiological information. If result is POSITIVE SARS-CoV-2 target nucleic acids are DETECTED. The SARS-CoV-2 RNA is generally detectable in upper and lower  respiratory specimens dur ing the acute phase of infection.  Positive  results are indicative of active infection with SARS-CoV-2.  Clinical  correlation with patient history and other diagnostic information is  necessary to determine patient infection status.  Positive results do  not rule out bacterial infection or co-infection with other viruses. If result is PRESUMPTIVE POSTIVE SARS-CoV-2 nucleic acids MAY BE PRESENT.   A presumptive positive result was obtained on the submitted specimen  and confirmed on repeat testing.  While 2019 novel coronavirus  (SARS-CoV-2) nucleic acids may be present in the submitted sample  additional confirmatory  testing may be necessary for epidemiological  and / or clinical management purposes  to differentiate between  SARS-CoV-2 and other Sarbecovirus currently known to infect humans.  If clinically indicated additional testing with an alternate test  methodology [phone]) is advised. The SARS-CoV-2 RNA is generally  detectable in upper and lower respiratory sp ecimens during the acute  phase of infection. The expected result is Negative. Fact Sheet for Patients:  StrictlyIdeas.no Fact Sheet for Healthcare Providers: BankingDealers.co.za This test is not yet approved or cleared by the [place] FDA and has been authorized for detection and/or diagnosis of SARS-CoV-2 by FDA under an Emergency Use Authorization (EUA).  This EUA will remain in effect (meaning this test can be used) for the duration of the COVID-19 declaration under Section 564(b)(1) of the Act, 21 U.S.C. section 360bbb-3(b)(1), unless the authorization is terminated or revoked sooner. Performed at [facility], [place] [address] [place], [place] [address]    Dg Chest 2 View  Result Date: 12/08/2018 CLINICAL DATA:  Shortness of breath and cough for 3 days EXAM: CHEST - 2 VIEW COMPARISON:  Chest x-ray 09/04/2015 and chest CT 02/18/2018. FINDINGS: The heart is enlarged. Stable surgical changes from bypass surgery. There is tortuosity and calcification of the thoracic aorta. Chronic interstitial lung disease as demonstrated on prior chest films and CT scan. Findings may be slightly progressive. No focal infiltrates or effusions. No worrisome pulmonary lesions. The pacer wires are stable. The bony structures are intact. IMPRESSION: 1. Stable mild cardiac enlargement. 2. Persistent interstitial lung disease with possible progression. 3. No infiltrates or effusions. Electronically Signed   By: Marijo Sanes M.D.   On: 12/08/2018 17:26    Pending Labs Unresulted Labs (From admission,  onward)    Start     Ordered   12/09/18 2567  Basic metabolic panel  Tomorrow morning,   R     12/08/18 1951   12/09/18 0500  CBC  Tomorrow morning,   R     12/08/18 1951  Vitals/Pain Today's Vitals   12/08/18 2030 12/08/18 2045 12/08/18 2100 12/08/18 2117  BP: 129/80 126/89 (!) 127/101   Pulse: 78 (!) 123 94   Resp: (!) 26 (!) 25 (!) 21   Temp:      TempSrc:      SpO2: 96% 96% 96%   PainSc:    0-No pain    Isolation Precautions No active isolations  Medications Medications  sodium chloride flush (NS) 0.9 % injection 3 mL (has no administration in time range)  diltiazem (CARDIZEM) 100 mg in dextrose 5% 147m (1 mg/mL) infusion (12.5 mg/hr Intravenous Rate/Dose Change 12/08/18 2044)  furosemide (LASIX) injection 80 mg (80 mg Intravenous Given 12/08/18 1744)  diltiazem (CARDIZEM) injection 15 mg (15 mg Intravenous Given 12/08/18 1744)    Mobility walks with person assist Low fall risk   Focused Assessments Cardiac Assessment Handoff:  Cardiac Rhythm: Sinus tachycardia Lab Results  Component Value Date   CKTOTAL 60 05/23/2010   CKMB 1.4 05/23/2010   TROPONINI <0.03 09/04/2015   No results found for: DDIMER Does the Patient currently have chest pain? No     R Recommendations: See Admitting Provider Note  Report given to:   Additional Notes:

## 2018-12-08 NOTE — Telephone Encounter (Signed)
Spoke with daughter Arrie Aran. Patient's HR is 128-140s. She is SOB. Dr. Valeta Harms stopped her amiodarone d/t interstitial lung problems and she has not been started on anything new. SOB when lying back and when doing minimal activity. Symptoms for the past few days. Advised ED eval for HF symptoms and tachycardia would be most appropriate. Daughter is in agreement. She asked if a family member can be present, as patient is hard of hearing - per [facility] ED staff, they are allowed 1 visitor duration of ED stay. Nurse first in [facility] ED notified  Patient had virtual visit with pulmonary today and they feel her symptoms are cardiac related NP note: "Congestive heart failure - Possible CHF exacerbation - Weight is up 5-8lbs - Currently taking lasix 44m daily - Patient needs to contact cardiology"

## 2018-12-08 NOTE — H&P (Signed)
History and Physical    [name] NOB:[id number] DOB: 01-09-1946 DOA: 12/08/2018  PCP: Dana Sender, FNP  Patient coming from: Home and lives with her husband and daughter  I have personally briefly reviewed patient's old medical records in [place]  Chief Complaint: Worsening shortness of breath, nausea and orthopnea  HPI: [name] is a 73 y.o. female with medical history significant of CAD status post CABG, paroxysmal atrial fibrillation on Eliquis, heart failure status post pacemaker, ILD, amiodarone pulmonary toxicity, restrictive lung disease, sleep apnea (not on CPAP), insulin-dependent type 2 diabetes, hypertension who presents with worsening shortness of breath, orthopnea and nausea.  Patient reports that for the past 2 to 3 weeks she has noticed increasing dyspnea with exertion.  She was having trouble just walking to her mailbox and back.  For the past few days she is progressively gotten worse to the point that she could not sleep due to orthopnea.  She also notes increased nausea.  Also has noticed increase lower extremity edema.  Reports her blood glucose is also been elevated up to the 200-400.  States that she continues to take her insulin as prescribed.  Patient denies any fevers, chills, new cough or runny nose.  Denies any chest pain.  Denies any abdominal pain or vomiting.  ED Course: She was afebrile and mildly hypertensive with blood pressure of 140s over 90.  Her heart rate was found to be in atrial fibrillation with RVR with rates up to 140 per EDP.  She was given a diltiazem bolus in the ED with minimal improvement and was ultimately started on a diltiazem drip.  She was also given IV 80 mg Lasix.  Her blood pressure remained stable.  CBC showed no anemia or leukocytosis.  BMP showed elevated glucose of 219 and normal stable creatinine.  Troponin of 9.  BNP of 110.  Chest x-ray showed stable mild cardiac enlargement and no acute  processes.  Review of Systems:  Constitutional:No Fever ENT/Mouth: No sore throat, No Rhinorrhea Eyes: No Eye Pain, No Vision Changes Cardiovascular: No Chest Pain, + SOB, + PND, + Dyspnea on Exertion, + Orthopnea Respiratory: No Cough, No Sputum, No Wheezing, +Dyspnea  Gastrointestinal: + Nausea, No Vomiting, No Diarrhea, No Constipation, No Pain Genitourinary: no Urinary Incontinence, No Urgency, No Flank Pain Musculoskeletal: No Arthralgias, No Myalgias Skin: No Skin Lesions, No Pruritus, Neuro: no Weakness, No Numbness,  No Loss of Consciousness, No Syncope Psych: No Anxiety/Panic, No Depression, decrease appetite Heme/Lymph: No Bruising, No Bleeding  Past Medical History:  Diagnosis Date   Asthmatic bronchitis    CHF (congestive heart failure) (HCC) dx'd 2016   Coronary atherosclerosis of native coronary artery    a. s/p CABG 2005 Dr Roxy Manns     Hyperlipidemia    Hypertension    Hypertensive heart disease    Kidney stones    Mixed hyperlipidemia    Paroxysmal atrial fibrillation ([place])    a. s/p Maze in 2005 b. recurrence in March 2016 with >8sec posttermination pauses s/p PPM implant     Pneumonia 1950   "double"   Presence of permanent cardiac pacemaker    Sleep apnea    "tested; mask ordered; couldn't afford it; will get it now" (09/04/2015)   TIA (transient ischemic attack)    Type II diabetes mellitus ([place])    Uterine cancer [facility])    age 3 with partial hysterectomy    Past Surgical History:  Procedure Laterality Date  CARDIAC CATHETERIZATION  2005   CORONARY ARTERY BYPASS GRAFT  2005   Dr Roxy Manns with MAZE   CYSTOSCOPY WITH URETEROSCOPY, STONE BASKETRY AND STENT PLACEMENT Left 09/08/2014   Procedure: CYSTOSCOPY WITH URETEROSCOPY, STONE BASKETRY AND STENT PLACEMENT;  Surgeon: Cleon Gustin, MD;  Location: [facility];  Service: Urology;  Laterality: Left;   CYSTOSCOPY/URETEROSCOPY/HOLMIUM LASER/STENT PLACEMENT Left 09/15/2014   Procedure:  CYSTOSCOPY/RETROGRADE/URETEROSCOPY/HOLMIUM LASER/ STONE EXTRACTION /STENT EXCHANGE;  Surgeon: Cleon Gustin, MD;  Location: [facility];  Service: Urology;  Laterality: Left;   HOLMIUM LASER APPLICATION Left 7/41/2878   Procedure: HOLMIUM LASER APPLICATION;  Surgeon: Cleon Gustin, MD;  Location: [facility];  Service: Urology;  Laterality: Left;   HOLMIUM LASER APPLICATION Left 08/22/6718   Procedure: HOLMIUM LASER APPLICATION;  Surgeon: Cleon Gustin, MD;  Location: [facility];  Service: Urology;  Laterality: Left;   INSERT / REPLACE / REMOVE PACEMAKER     LAPAROSCOPIC CHOLECYSTECTOMY     PERMANENT PACEMAKER INSERTION N/A 06/05/2014   MDT Advisa 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 She has never used smokeless tobacco. She reports that she does not drink alcohol or use drugs.  No Known Allergies  Family History  Problem Relation Age of Onset   Hypertension Mother    Hyperlipidemia Mother    Heart disease Mother    Diabetes Father    Stroke Father    Heart disease Father    Hypertension Sister        3 sisters   Arthritis Brother    Heart attack Maternal Grandfather    Stroke Paternal Grandfather    Hypertension Daughter    Family history reviewed and not pertinent   Prior to Admission medications   Medication Sig Start Date End Date Taking? Authorizing Provider  acetaminophen (TYLENOL) 500 MG tablet Take 500-1,000 mg by mouth every 6 (six) hours as needed for moderate pain.    [provider]  apixaban (ELIQUIS) 5 MG TABS tablet Take 1 tablet (5 mg total) by mouth 2 (two) times daily. Take 1st dose 06-07-14 06/06/14   Chanetta Marshall K, NP  aspirin 81 MG tablet Take 81 mg by mouth daily.     [provider]  B Complex-Biotin-FA (B-COMPLEX PO) Take 1 capsule by mouth every morning.     [provider]  BIOTIN PO Take 1 tablet by mouth  daily.    [provider]  Cholecalciferol (VITAMIN D3) 1000 UNITS CAPS Take 1,000 Units by mouth daily.    [provider]  Coenzyme Q10 (CO Q-10) 200 MG CAPS Take 200 mg by mouth daily.    [provider]  diphenhydramine-acetaminophen (TYLENOL PM) 25-500 MG TABS tablet Take 1 tablet by mouth at bedtime as needed (sleep).    [provider]  doxycycline (VIBRA-TABS) 100 MG tablet Take 1 tablet (100 mg total) by mouth 2 (two) times daily. 12/08/18   Martyn Ehrich, NP  ezetimibe (ZETIA) 10 MG tablet Take 10 mg by mouth daily.    [provider]  fenofibrate 160 MG tablet TAKE 1 TABLET DAILY 10/14/18   Troy Sine, MD  furosemide (LASIX) 40 MG tablet Take 2 tablets (80 mg total) by mouth daily. 12/25/17 03/25/18  Evans Lance, MD  GuaiFENesin (TUSSIN PO) Take by mouth 2 (two) times daily as needed (Cough/congestion). Pt unsure of strength    [provider]  HUMULIN 70/30 KWIKPEN (70-30) 100 UNIT/ML PEN Inject 35 Units into the muscle 2 (two) times daily at 8 am and 10 pm. 01/14/18   [provider]  Icosapent Ethyl (VASCEPA) 1 g CAPS Take 2 capsules (2 g total) by mouth 2 (two) times daily. 03/22/18   Troy Sine, MD  losartan (COZAAR) 50 MG tablet TAKE 1 TABLET UPTO TWICE A DAY AS DIRECTED 08/26/18   Troy Sine, MD  metFORMIN (GLUCOPHAGE) 500 MG tablet Take 500 mg by mouth 2 (two) times daily with a meal.     [provider]  metoprolol succinate (TOPROL-XL) 100 MG 24 hr tablet Take 1 tablet daily with or immediatelyfollowing a meal. 02/19/17   Troy Sine, MD  NEOMYCIN-POLYMYXIN-HYDROCORTISONE (CORTISPORIN) 1 % SOLN otic solution Place 4 drops into both ears 2 (two) times daily as needed. For pain and itching 04/04/14   [provider]  Omega-3 Fatty Acids (FISH OIL PO) Take 2 capsules by mouth 2 (two) times daily.    [provider]  potassium chloride (K-DUR,KLOR-CON) 10 MEQ tablet Take 10  mEq by mouth every morning.     [provider]  Semaglutide (RYBELSUS) 3 MG TABS Take by mouth.    [provider]    Physical Exam: Well-appearing, nontoxic obese female  Vitals:   12/08/18 1900 12/08/18 1915 12/08/18 1930 12/08/18 1945  BP: (!) 129/92 (!) 142/85 131/69 118/85  Pulse: (!) 147 (!) 125 75   Resp: (!) 24 (!) 24 (!) 25 18  Temp:      TempSrc:      SpO2: 96% 96% 96%     Constitutional: NAD, calm, comfortable Vitals:   12/08/18 1900 12/08/18 1915 12/08/18 1930 12/08/18 1945  BP: (!) 129/92 (!) 142/85 131/69 118/85  Pulse: (!) 147 (!) 125 75   Resp: (!) 24 (!) 24 (!) 25 18  Temp:      TempSrc:      SpO2: 96% 96% 96%    Eyes: PERRL, lids and conjunctivae normal ENMT: Mucous membranes are moist. Posterior pharynx clear of any exudate or lesions.Normal dentition.  Neck: normal, supple, no masses Respiratory: clear to auscultation bilaterally, no wheezing, no crackles. Normal respiratory effort. No accessory muscle use.  Cardiovascular: Irregularly irregular rate up to 120 on telemetry, no murmurs / rubs / gallops.  2+ pedal pulses.  Bilateral nonpitting edema up to pretibial area. Abdomen: no tenderness, no masses palpated. Bowel sounds positive.  Musculoskeletal: no clubbing / cyanosis. No joint deformity upper and lower extremities. Good ROM, no contractures. Normal muscle tone.  Skin: no rashes, lesions, ulcers. No induration Neurologic: CN 2-12 grossly intact. Sensation intact, DTR normal. Strength 4 out of 5 of the lower extremity.  Psychiatric: Normal judgment and insight. Alert and oriented x 3. Normal mood.    Labs on Admission: I have personally reviewed following labs and imaging studies  CBC: Recent Labs  Lab 12/08/18 1641  WBC 9.5  HGB 12.0  HCT 36.7  MCV 93.9  PLT 294   Basic Metabolic Panel: Recent Labs  Lab 12/08/18 1641  NA 136  K 3.5  CL 97*  CO2 26  GLUCOSE 219*  BUN 14  CREATININE 1.00  CALCIUM 9.2    GFR: CrCl cannot be calculated (Unknown ideal weight.). Liver Function Tests: No results for input(s): AST, ALT, ALKPHOS, BILITOT, PROT, ALBUMIN in the last 168 hours. No results for input(s): LIPASE, AMYLASE in the last 168 hours. No results for input(s):  AMMONIA in the last 168 hours. Coagulation Profile: No results for input(s): INR, PROTIME in the last 168 hours. Cardiac Enzymes: No results for input(s): CKTOTAL, CKMB, CKMBINDEX, TROPONINI in the last 168 hours. BNP (last 3 results) No results for input(s): PROBNP in the last 8760 hours. HbA1C: No results for input(s): HGBA1C in the last 72 hours. CBG: No results for input(s): GLUCAP in the last 168 hours. Lipid Profile: No results for input(s): CHOL, HDL, LDLCALC, TRIG, CHOLHDL, LDLDIRECT in the last 72 hours. Thyroid Function Tests: No results for input(s): TSH, T4TOTAL, FREET4, T3FREE, THYROIDAB in the last 72 hours. Anemia Panel: No results for input(s): VITAMINB12, FOLATE, FERRITIN, TIBC, IRON, RETICCTPCT in the last 72 hours. Urine analysis:    Component Value Date/Time   COLORURINE YELLOW 04/05/2017 0134   APPEARANCEUR CLEAR 04/05/2017 0134   LABSPEC 1.021 04/05/2017 0134   PHURINE 5.0 04/05/2017 0134   GLUCOSEU 50 (A) 04/05/2017 0134   HGBUR NEGATIVE 04/05/2017 0134   BILIRUBINUR NEGATIVE 04/05/2017 0134   KETONESUR NEGATIVE 04/05/2017 0134   PROTEINUR NEGATIVE 04/05/2017 0134   UROBILINOGEN 1.0 07/30/2014 0001   NITRITE NEGATIVE 04/05/2017 0134   LEUKOCYTESUR TRACE (A) 04/05/2017 0134    Radiological Exams on Admission: Dg Chest 2 View  Result Date: 12/08/2018 CLINICAL DATA:  Shortness of breath and cough for 3 days EXAM: CHEST - 2 VIEW COMPARISON:  Chest x-ray 09/04/2015 and chest CT 02/18/2018. FINDINGS: The heart is enlarged. Stable surgical changes from bypass surgery. There is tortuosity and calcification of the thoracic aorta. Chronic interstitial lung disease as demonstrated on prior chest films and  CT scan. Findings may be slightly progressive. No focal infiltrates or effusions. No worrisome pulmonary lesions. The pacer wires are stable. The bony structures are intact. IMPRESSION: 1. Stable mild cardiac enlargement. 2. Persistent interstitial lung disease with possible progression. 3. No infiltrates or effusions. Electronically Signed   By: Marijo Sanes M.D.   On: 12/08/2018 17:26    EKG: Independently reviewed.   Assessment/Plan  Atrial fibrillation with RVR -Continue diltiazem drip -Continue telemetry - will hold metoprolol succinate for now pending further cardiology recommendation in the morning -Continue Eliquis  Probable CHF exacerbation secondary to atrial fibrillation with RVR -Last echocardiogram was in April 2018 which showed EF of 65 to 70% with mildly dilated ascending aorta and moderate mitral valve stenosis -She was given IV 80MG Lasix in the ED -Check daily intake and output -Daily weights - repeat echo  Insulin-dependent type 2 diabetes -Patient normally on 35 units twice daily of Humulin 70/30 - start on 20 units BID, keep on moderate SSI   Hyperlipidemia -Continue Zetia, fenofibrate  Hypertension -Continue losartan      DVT prophylaxis: Eliquis Code Status: DNR Family Communication: plan discussed with daughter and patient at bedside Disposition Plan: Home after 2 midnight for atrial fibrillation with RVR and suspected CHF exacerbation secondary to arrhythmia. Consults called: cardiology Admission status: Inpatient   Laysa Kimmey T Darelle Kings DO [facility]   If 7PM-7AM, please contact night-coverage [email or web address] Password [facility]  12/08/2018, 7:52 PM

## 2018-12-09 ENCOUNTER — Encounter (HOSPITAL_COMMUNITY): Payer: Self-pay | Admitting: Student

## 2018-12-09 ENCOUNTER — Inpatient Hospital Stay (HOSPITAL_COMMUNITY): Payer: Medicare Other

## 2018-12-09 DIAGNOSIS — I5021 Acute systolic (congestive) heart failure: Secondary | ICD-10-CM

## 2018-12-09 LAB — CBC
HCT: 35.7 % — ABNORMAL LOW (ref 36.0–46.0)
Hemoglobin: 11.5 g/dL — ABNORMAL LOW (ref 12.0–15.0)
MCH: 30.1 pg (ref 26.0–34.0)
MCHC: 32.2 g/dL (ref 30.0–36.0)
MCV: 93.5 fL (ref 80.0–100.0)
Platelets: 287 10*3/uL (ref 150–400)
RBC: 3.82 MIL/uL — ABNORMAL LOW (ref 3.87–5.11)
RDW: 15.4 % (ref 11.5–15.5)
WBC: 9.3 10*3/uL (ref 4.0–10.5)
nRBC: 0 % (ref 0.0–0.2)

## 2018-12-09 LAB — GLUCOSE, CAPILLARY
Glucose-Capillary: 234 mg/dL — ABNORMAL HIGH (ref 70–99)
Glucose-Capillary: 397 mg/dL — ABNORMAL HIGH (ref 70–99)
Glucose-Capillary: 348 mg/dL — ABNORMAL HIGH (ref 70–99)
Glucose-Capillary: 286 mg/dL — ABNORMAL HIGH (ref 70–99)
Glucose-Capillary: 293 mg/dL — ABNORMAL HIGH (ref 70–99)

## 2018-12-09 LAB — MAGNESIUM: Magnesium: 1.7 mg/dL (ref 1.7–2.4)

## 2018-12-09 LAB — MRSA PCR SCREENING: MRSA by PCR: NEGATIVE

## 2018-12-09 LAB — BASIC METABOLIC PANEL
Anion gap: 12 (ref 5–15)
BUN: 15 mg/dL (ref 8–23)
CO2: 27 mmol/L (ref 22–32)
Calcium: 9.1 mg/dL (ref 8.9–10.3)
Chloride: 97 mmol/L — ABNORMAL LOW (ref 98–111)
Creatinine, Ser: 0.92 mg/dL (ref 0.44–1.00)
GFR calc Af Amer: >60 mL/min (ref >60)
GFR calc non Af Amer: >60 mL/min (ref >60)
Glucose, Bld: 263 mg/dL — ABNORMAL HIGH (ref 70–99)
Potassium: 4 mmol/L (ref 3.5–5.1)
Sodium: 136 mmol/L (ref 135–145)

## 2018-12-09 LAB — ECHOCARDIOGRAM COMPLETE: Weight: 3721.36 oz

## 2018-12-09 LAB — HEMOGLOBIN A1C
Hgb A1c MFr Bld: 10.9 % — ABNORMAL HIGH (ref 4.8–5.6)
Mean Plasma Glucose: 266.13 mg/dL

## 2018-12-09 MED ORDER — METFORMIN HCL 500 MG PO TABS
500.0000 mg | ORAL_TABLET | Freq: Two times a day (BID) | ORAL | Status: DC
  Administered 2018-12-09 – 2018-12-13 (×8): 500 mg via ORAL
  Filled 2018-12-09 (×8): qty 1

## 2018-12-09 MED ORDER — PERFLUTREN LIPID MICROSPHERE
1.0000 mL | INTRAVENOUS | Status: AC | PRN
  Administered 2018-12-09: 3 mL via INTRAVENOUS
  Filled 2018-12-09: qty 10

## 2018-12-09 MED ORDER — SEMAGLUTIDE 3 MG PO TABS
3.0000 mg | ORAL_TABLET | Freq: Every morning | ORAL | Status: DC
  Administered 2018-12-11 – 2018-12-13 (×3): 3 mg via ORAL
  Filled 2018-12-09 (×4): qty 1

## 2018-12-09 MED ORDER — HYDROXYZINE HCL 25 MG PO TABS
25.0000 mg | ORAL_TABLET | Freq: Once | ORAL | Status: AC
  Administered 2018-12-09: 25 mg via ORAL
  Filled 2018-12-09: qty 1

## 2018-12-09 MED ORDER — METOPROLOL TARTRATE 25 MG PO TABS
25.0000 mg | ORAL_TABLET | Freq: Two times a day (BID) | ORAL | Status: DC
  Administered 2018-12-09: 25 mg via ORAL
  Filled 2018-12-09 (×2): qty 1

## 2018-12-09 MED ORDER — METOPROLOL SUCCINATE ER 50 MG PO TB24
50.0000 mg | ORAL_TABLET | Freq: Every day | ORAL | Status: DC
  Administered 2018-12-09: 50 mg via ORAL
  Filled 2018-12-09: qty 1

## 2018-12-09 MED ORDER — SEMAGLUTIDE 3 MG PO TABS: 3.0000 mg | ORAL_TABLET | Freq: Every morning | ORAL | Status: DC

## 2018-12-09 MED ORDER — DIGOXIN 0.25 MG/ML IJ SOLN
0.5000 mg | Freq: Once | INTRAMUSCULAR | Status: AC
  Administered 2018-12-09: 02:00:00 0.5 mg via INTRAVENOUS
  Filled 2018-12-09: qty 2

## 2018-12-09 MED ORDER — FUROSEMIDE 10 MG/ML IJ SOLN
60.0000 mg | Freq: Two times a day (BID) | INTRAMUSCULAR | Status: AC
  Administered 2018-12-09 – 2018-12-12 (×7): 60 mg via INTRAVENOUS
  Filled 2018-12-09 (×7): qty 6

## 2018-12-09 NOTE — Plan of Care (Signed)
  Problem: Clinical Measurements: Goal: Ability to maintain clinical measurements within normal limits will improve Outcome: Progressing   Problem: Clinical Measurements: Goal: Cardiovascular complication will be avoided Outcome: Progressing   Problem: Nutrition: Goal: Adequate nutrition will be maintained Outcome: Progressing   Problem: Coping: Goal: Level of anxiety will decrease Outcome: Progressing   Problem: Pain Managment: Goal: General experience of comfort will improve Outcome: Progressing   Problem: Safety: Goal: Ability to remain free from injury will improve Outcome: Progressing   Problem: Skin Integrity: Goal: Risk for impaired skin integrity will decrease Outcome: Progressing

## 2018-12-09 NOTE — Progress Notes (Signed)
Patient had 10 beat run of NSVT and is asymptomatic. MD notified. Will continue to monitor.

## 2018-12-09 NOTE — Progress Notes (Signed)
Hospitalist progress note   [name]  RSW:[id number] DOB: 1945/04/30 DOA: 12/08/2018 PCP: Wannetta Sender, FNP   Brief Narrative:  [age] female SN dysfunction + PPM + CHR A. fib/amiodarone lung disease, HTN, DM TY 2, CABG 2005, OSA not on CPAP, prior TIA, nephrolithiasis status post stent placement and retrieval Dr. Bess Harvest 08/2014 Last admission 09/05/2015 A. fib RVR Present [facility] ED DOE orthopnea nausea LE edema-CBG~300 range-A. fib RVR in ED-Rx bolus diltiazem also AECHF systolic-Lasix Assessment & Plan:   A. fib RVR chads score >5 Sitting up in the bed her heart rate goes to 120-resume lower dose of metoprolol XL 50 down from usual dose 100 Continue Cardizem gtt May need to titrate this upward we will ask her to ambulate and see how things go Likely diastolic heart failure-baseline weight is 106 kg in outpatient setting Patient claims noncompliance with twice daily dosing of 40 Lasix-says she only takes 1 typically Reinforced the need to be consistent, low-salt diet, fluid restriction-placing on 60 IV twice daily X 2-3 more days, once she is less symptomatic and orthopneic can decide on discharge OSA not on CPAP Patient is willing to try CPAP but does not use it at home-I explained clearly to her that there is a direct correlation between OSA and A. fib-she understands She will need follow-up with Dr. Sharma Covert in the outpatient setting Prior TIA Unclear circumstances-continue apixaban 5 twice daily CABG 2005 Re-stratify as an outpatient with labs continue fenofibrate 160, Zetia 10 DM ty II (home dose 35 Levemir twice daily 7030) Continue Levemir 25 twice daily, moderate sliding scale coverage We will resume metformin 500 twice daily in house in addition to semaglutide Amiodarone lung toxicity CXR from admission shows progression of ILD which was thought to be secondary to amiodarone At this stage I will diurese her however if her breathing does not improve she may require  steroids and pulmonology input    DVT prophylaxis: Apixaban code Status:   Full   family Communication:   Daughter at bedside disposition Plan: Inpatient pending resolution   Consultants:   None as yet  Procedures:   None  Antimicrobials:   No  Subjective: Awake alert coherent no distress tells me she has been having several months of shortness of breath prior to coming in which culminated in admission No fever chills DOE is present however with minimal exertion-no sputum  Objective: Vitals:   12/09/18 0252 12/09/18 0400 12/09/18 0500 12/09/18 0551  BP:  110/73 (!) 106/91   Pulse:  (!) 124 83   Resp:  (!) 22 18   Temp: 98.3 F (36.8 C)     TempSrc: Oral     SpO2:  98% 99%   Weight:    105.5 kg    Intake/Output Summary (Last 24 hours) at 12/09/2018 0938 Last data filed at 12/09/2018 0400 Gross per 24 hour  Intake 115.32 ml  Output 1600 ml  Net -1484.68 ml   Filed Weights   12/09/18 0551  Weight: 105.5 kg    Examination: EOMI NCAT thick neck Mallampati 4 obese habitus No submandibular LA N, no thyromegaly Clinically clear posterolaterally in the chest cannot appreciate rales rhonchi Cannot appreciate JVD but habitus precludes good exam Abdomen is obese nontender no rebound no guarding Some lower extremity edema Neurologically intact moving all 4 limbs Skin soft supple  Data Reviewed: I have personally reviewed following labs and imaging studies  CBC: Recent Labs  Lab 12/08/18 1641 12/09/18 0459  WBC 9.5  9.3  HGB 12.0 11.5*  HCT 36.7 35.7*  MCV 93.9 93.5  PLT 286 501   Basic Metabolic Panel: Recent Labs  Lab 12/08/18 1641 12/09/18 0459  NA 136 136  K 3.5 4.0  CL 97* 97*  CO2 26 27  GLUCOSE 219* 263*  BUN 14 15  CREATININE 1.00 0.92  CALCIUM 9.2 9.1  MG  --  1.7   GFR: Estimated Creatinine Clearance: 65.4 mL/min (by C-G formula based on SCr of 0.92 mg/dL). Liver Function Tests: No results for input(s): AST, ALT, ALKPHOS, BILITOT,  PROT, ALBUMIN in the last 168 hours. No results for input(s): LIPASE, AMYLASE in the last 168 hours. No results for input(s): AMMONIA in the last 168 hours. Coagulation Profile: No results for input(s): INR, PROTIME in the last 168 hours. Cardiac Enzymes:  Radiology Studies: Reviewed images personally in health database   Scheduled Meds: . apixaban  5 mg Oral BID  . aspirin  81 mg Oral Daily  . ezetimibe  10 mg Oral Daily  . fenofibrate  160 mg Oral Daily  . insulin aspart  0-15 Units Subcutaneous TID WC  . insulin detemir  25 Units Subcutaneous BID  . losartan  50 mg Oral Daily  . potassium chloride  10 mEq Oral q morning - 10a   Continuous Infusions: . diltiazem (CARDIZEM) infusion 15 mg/hr (12/09/18 0400)     LOS: 1 day    Time spent: [place], MD [facility] ([facility]  If 7PM-7AM, please contact night-coverage [email or web address] Password [facility] 12/09/2018, 7:28 AM

## 2018-12-09 NOTE — Plan of Care (Signed)
  Problem: Education: Goal: Knowledge of General Education information will improve Description: Including pain rating scale, medication(s)/side effects and non-pharmacologic comfort measures Outcome: Progressing   Problem: Clinical Measurements: Goal: Ability to maintain clinical measurements within normal limits will improve Outcome: Progressing   Problem: Clinical Measurements: Goal: Respiratory complications will improve Outcome: Progressing   Problem: Clinical Measurements: Goal: Cardiovascular complication will be avoided Outcome: Progressing   Problem: Nutrition: Goal: Adequate nutrition will be maintained Outcome: Progressing   Problem: Coping: Goal: Level of anxiety will decrease Outcome: Progressing   Problem: Pain Managment: Goal: General experience of comfort will improve Outcome: Progressing   Problem: Skin Integrity: Goal: Risk for impaired skin integrity will decrease Outcome: Progressing

## 2018-12-09 NOTE — Progress Notes (Signed)
Discussed with patient to clarify code status she came to floor as DNR I spoke at length with patient what she wanted. She wants to be a FULL code at this time. RN Joselyn witness conversation and patient wishes. MD notified.

## 2018-12-09 NOTE — Progress Notes (Signed)
  Echocardiogram 2D Echocardiogram with definity has been performed.  Darlina Sicilian M 12/09/2018, 10:05 AM

## 2018-12-09 NOTE — Progress Notes (Signed)
Patient sitting in bed very anxious. HR 90-120's. Patient requests something to calm down. Page MD for orders. Awaiting response. RN will continue to monitor patient.

## 2018-12-09 NOTE — Plan of Care (Signed)

## 2018-12-09 NOTE — Progress Notes (Addendum)
Inpatient Diabetes Program Recommendations  AACE/ADA: New Consensus Statement on Inpatient Glycemic Control   Target Ranges:  Prepandial:   less than 140 mg/dL      Peak postprandial:   less than 180 mg/dL (1-2 hours)      Critically ill patients:  140 - 180 mg/dL   Results for [name], [name] (MRN [id number]) as of 12/09/2018 09:54  Ref. Range 12/08/2018 22:21 12/09/2018 06:37 12/09/2018 08:53  Glucose-Capillary Latest Ref Range: 70 - 99 mg/dL 172 (H) 234 (H) 397 (H)  Results for [name], [name] (MRN [id number]) as of 12/09/2018 09:54  Ref. Range 12/09/2018 04:59  Hemoglobin A1C Latest Ref Range: 4.8 - 5.6 % 10.9 (H)   Review of Glycemic Control  Diabetes history: DM2 Outpatient Diabetes medications: 70/30 35 units BID, Metformin 500 mg BID, Semaglutide 3 mg daily Current orders for Inpatient glycemic control: Levemir 25 units BID, Novolog 0-15 units TID with meals, Metformin 500 mg BID, Semaglutide 3 mg daily  Inpatient Diabetes Program Recommendations:   Insulin-correction: Please consider ordering Novolog 0-5 units QHS for bedtime correction.  Insulin-Meal Coverage:  If post prandial glucose is consistently greater than 180 mg/dl, please consider ordering Novolog 4 units TID with meals for meal coverage if patient eats at least 50% of meals.  HbgA1C:A1C 10.9% on 12/09/18 indicating an average glucose of 266 mg/dl over the past 2-3 months.  NOTE: Noted patient received Levemir 25 units at 22:14 on 12/08/18 and fasting glucose 234 mg/dl. Glucose up to 397 mg/dl at 8:53 am; anticipate patient ate between time of fasting glucose and glucose at 8:53 am.   Addendum 12/09/18_0 :45-Spoke with patient about diabetes and home regimen for diabetes control. Patient reports being followed by PCP for diabetes management and currently taking 70/30 35 units BID, Metformin 500 mg BID, Semaglutide 3 mg daily as an outpatient for diabetes control. Patient reports taking DM medications as prescribed. Patient  reports that she was started on Semaglutide last week on Friday.  Patient reports glucose has been running 200-400's mg/dl and she has not seen a change in glucose yet since started on Semaglutide.  Inquired about prior A1C and patient reports her A1C was checked last Monday but she does not know the results.  Discussed A1C results (10.9% on 12/09/18) and explained that current A1C indicates an average glucose of 266 mg/dl over the past 2-3 months. Discussed glucose and A1C goals.  Discussed importance of checking CBGs and maintaining good CBG control to prevent long-term and short-term complications.  Patient states that she does not see an Endocrinologist. Patient states she recently started seeing a new PCP and he has mentioned changing her to a different insulin. Patient states that she has Humulin 70/30 insulin pens at home and she would like to use up the insulin she has before changing to a different one. Patient states that she went into the doughnut hole in April this year so she is having to pay out of pocket for medications. Patient states that her Humulin 70/30 cost her $125 for a box of 5 pens. Informed patient that Novolin 70/30 insulin pens could be purchased at [facility] for $43 for a box of 5 insulin pens. Encouraged patient to also call the 1-800 number on her Medicare card to see if she would be eligible for any medication assistance. Encouraged patient to follow up with PCP regarding DM control and to ask about being referred to an Endocrinologist if DM continues to be uncontrolled.   Patient verbalized understanding of  information discussed and reports no further questions at this time related to diabetes.  Thanks, Barnie Alderman, RN, MSN, CDE Diabetes Coordinator Inpatient Diabetes Program [phone] (Team Pager from 8am to 5pm)

## 2018-12-10 DIAGNOSIS — J849 Interstitial pulmonary disease, unspecified: Secondary | ICD-10-CM

## 2018-12-10 LAB — GLUCOSE, CAPILLARY
Glucose-Capillary: 175 mg/dL — ABNORMAL HIGH (ref 70–99)
Glucose-Capillary: 203 mg/dL — ABNORMAL HIGH (ref 70–99)
Glucose-Capillary: 204 mg/dL — ABNORMAL HIGH (ref 70–99)
Glucose-Capillary: 275 mg/dL — ABNORMAL HIGH (ref 70–99)
Glucose-Capillary: 276 mg/dL — ABNORMAL HIGH (ref 70–99)

## 2018-12-10 LAB — BASIC METABOLIC PANEL
Anion gap: 9 (ref 5–15)
BUN: 17 mg/dL (ref 8–23)
CO2: 32 mmol/L (ref 22–32)
Calcium: 8.9 mg/dL (ref 8.9–10.3)
Chloride: 97 mmol/L — ABNORMAL LOW (ref 98–111)
Creatinine, Ser: 0.99 mg/dL (ref 0.44–1.00)
GFR calc Af Amer: >60 mL/min (ref >60)
GFR calc non Af Amer: 57 mL/min — ABNORMAL LOW (ref >60)
Glucose, Bld: 216 mg/dL — ABNORMAL HIGH (ref 70–99)
Potassium: 3.3 mmol/L — ABNORMAL LOW (ref 3.5–5.1)
Sodium: 138 mmol/L (ref 135–145)

## 2018-12-10 LAB — SEDIMENTATION RATE: Sed Rate: 30 mm/hr — ABNORMAL HIGH (ref 0–22)

## 2018-12-10 LAB — CBC WITH DIFFERENTIAL/PLATELET
Abs Immature Granulocytes: 0.01 10*3/uL (ref 0.00–0.07)
Basophils Absolute: 0.1 10*3/uL (ref 0.0–0.1)
Basophils Relative: 1 %
Eosinophils Absolute: 0.3 10*3/uL (ref 0.0–0.5)
Eosinophils Relative: 3 %
HCT: 35 % — ABNORMAL LOW (ref 36.0–46.0)
Hemoglobin: 11.5 g/dL — ABNORMAL LOW (ref 12.0–15.0)
Immature Granulocytes: 0 %
Lymphocytes Relative: 40 %
Lymphs Abs: 3.5 10*3/uL (ref 0.7–4.0)
MCH: 30.7 pg (ref 26.0–34.0)
MCHC: 32.9 g/dL (ref 30.0–36.0)
MCV: 93.6 fL (ref 80.0–100.0)
Monocytes Absolute: 0.7 10*3/uL (ref 0.1–1.0)
Monocytes Relative: 8 %
Neutro Abs: 4.3 10*3/uL (ref 1.7–7.7)
Neutrophils Relative %: 48 %
Platelets: 289 10*3/uL (ref 150–400)
RBC: 3.74 MIL/uL — ABNORMAL LOW (ref 3.87–5.11)
RDW: 15.7 % — ABNORMAL HIGH (ref 11.5–15.5)
WBC: 8.8 10*3/uL (ref 4.0–10.5)
nRBC: 0 % (ref 0.0–0.2)

## 2018-12-10 LAB — CK TOTAL AND CKMB (NOT AT ARMC)
CK, MB: 1 ng/mL (ref 0.5–5.0)
Relative Index: INVALID (ref 0.0–2.5)
Total CK: 40 U/L (ref 38–234)

## 2018-12-10 LAB — MAGNESIUM: Magnesium: 1.6 mg/dL — ABNORMAL LOW (ref 1.7–2.4)

## 2018-12-10 MED ORDER — DILTIAZEM HCL-DEXTROSE 100-5 MG/100ML-% IV SOLN (PREMIX)
20.0000 mg/h | INTRAVENOUS | Status: DC
  Filled 2018-12-10: qty 100

## 2018-12-10 MED ORDER — DILTIAZEM HCL ER COATED BEADS 240 MG PO CP24
480.0000 mg | ORAL_CAPSULE | Freq: Every day | ORAL | Status: DC
  Administered 2018-12-10 – 2018-12-13 (×4): 480 mg via ORAL
  Filled 2018-12-10 (×4): qty 2

## 2018-12-10 MED ORDER — MAGNESIUM SULFATE 2 GM/50ML IV SOLN
2.0000 g | Freq: Once | INTRAVENOUS | Status: AC
  Administered 2018-12-10: 2 g via INTRAVENOUS
  Filled 2018-12-10: qty 50

## 2018-12-10 MED ORDER — POTASSIUM CHLORIDE CRYS ER 20 MEQ PO TBCR
40.0000 meq | EXTENDED_RELEASE_TABLET | Freq: Every day | ORAL | Status: DC
  Administered 2018-12-10 – 2018-12-13 (×4): 40 meq via ORAL
  Filled 2018-12-10 (×4): qty 2

## 2018-12-10 MED ORDER — METOPROLOL SUCCINATE ER 100 MG PO TB24
100.0000 mg | ORAL_TABLET | Freq: Every day | ORAL | Status: DC
  Administered 2018-12-10 – 2018-12-13 (×4): 100 mg via ORAL
  Filled 2018-12-10 (×4): qty 1

## 2018-12-10 MED ORDER — INSULIN DETEMIR 100 UNIT/ML ~~LOC~~ SOLN
30.0000 [IU] | Freq: Two times a day (BID) | SUBCUTANEOUS | Status: DC
  Administered 2018-12-10 – 2018-12-13 (×7): 30 [IU] via SUBCUTANEOUS
  Filled 2018-12-10 (×8): qty 0.3

## 2018-12-10 NOTE — Progress Notes (Signed)
Inpatient Diabetes Program Recommendations  AACE/ADA: New Consensus Statement on Inpatient Glycemic Control   Target Ranges:  Prepandial:   less than 140 mg/dL      Peak postprandial:   less than 180 mg/dL (1-2 hours)      Critically ill patients:  140 - 180 mg/dL   Results for [name], [name] (MRN [id number]) as of 12/10/2018 07:55  Ref. Range 12/09/2018 08:53 12/09/2018 11:07 12/09/2018 16:40 12/09/2018 21:46 12/10/2018 06:28  Glucose-Capillary Latest Ref Range: 70 - 99 mg/dL 397 (H) 348 (H) 286 (H) 293 (H) 175 (H)   Review of Glycemic Control  Diabetes history: DM2 Outpatient Diabetes medications: 70/30 35 units BID, Metformin 500 mg BID, Semaglutide 3 mg daily Current orders for Inpatient glycemic control: Levemir 25 units BID, Novolog 0-15 units TID with meals, Metformin 500 mg BID, Semaglutide 3 mg daily  Inpatient Diabetes Program Recommendations:   Insulin-correction: Please consider ordering Novolog 0-5 units QHS for bedtime correction.  Insulin-Meal Coverage:  Please consider ordering Novolog 4 units TID with meals for meal coverage if patient eats at least 50% of meals.  Thanks, Barnie Alderman, RN, MSN, CDE Diabetes Coordinator Inpatient Diabetes Program [phone] (Team Pager from 8am to 5pm)

## 2018-12-10 NOTE — Consult Note (Addendum)
NAME:  [name], MRN:  [id number], DOB:  21-Oct-1945, LOS: 2 ADMISSION DATE:  12/08/2018, CONSULTATION DATE:  12/10/2018 REFERRING MD:  Verlon Au, CHIEF COMPLAINT:  Progressive ILD  Amiodarone lung / OSA not wearing CPAP  Brief History   73 year old female  never smoker with medical history significant of CAD status post CABG, paroxysmal atrial fibrillation on Eliquis, heart failure status post pacemaker, ILD, amiodarone pulmonary toxicity, restrictive lung disease, sleep apnea (not on CPAP), insulin-dependent type 2 diabetes, hypertension who presents with worsening shortness of breath x 2 weeks ,with  orthopnea and nausea.She was admitted 12/08/2018  For worsening dyspnea,  A fib with RVR, with acute on chronic CHF. BNP was 110 on admission. PCCM have been asked to evaluate for her worsening dyspnea ,her diagnosis of  drug-induced ILD ( amiodarone,)  and her untreated OSA.   History of present illness   73 year old never smoker with medical history significant of CAD status post CABG, paroxysmal atrial fibrillation on Eliquis, heart failure status post pacemaker, ILD, amiodarone pulmonary toxicity, restrictive lung disease, sleep apnea (not on CPAP), insulin-dependent type 2 diabetes, hypertension who presents with worsening shortness of breath, orthopnea and nausea.  Patient reports that for  2 to 3 weeks  prior to admission she has noticed increasing dyspnea with exertion.  She was having trouble just walking to her mailbox and back.   She endorses that this has  progressively  worsened to the point that she could not sleep due to orthopnea.  She also noted increased nausea and increase lower extremity edema. She is seen by  Dr. Valeta Harms in the pulmonary office. She had a telephone visit with a pulmonary NP 9/23 and was advised to follow up with cardiology.  She is non-compliant with her CPAP therapy. Last HRCT is also concerning for pulmonary HTN as well as IPF. Marland KitchenPt was on Amiodarone x 3 years from  2016-2019. 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  In the ED she was afebrile and mildly hypertensive 140/90. Tele indicated A fib with RVR, HR of 140. She was bolused  with  diltiazem with minimal improvement and was ultimately started on a diltiazem drip.  She was also given IV 80 mg Lasix.  Her blood pressure remained stable.  CBC showed no anemia or leukocytosis.  BMP showed elevated glucose of 219 and normal stable creatinine.  Troponin of 9.  BNP of 110.  Chest x-ray showed stable mild cardiac enlargement and no acute processes.She was admitted by the [facility] team. PCCM have been consulted to assist with management of drug induced ILD and untreated OSA.    Past Medical History   Past Medical History:  Diagnosis Date   Asthmatic bronchitis    CHF (congestive heart failure) ([place]) dx'd 2016   Coronary atherosclerosis of native coronary artery    a. s/p CABG 2005 Dr Roxy Manns     Hyperlipidemia    Hypertension    Hypertensive heart disease    Kidney stones    Mixed hyperlipidemia    Paroxysmal atrial fibrillation ([place])    a. s/p Maze in 2005 b. recurrence in March 2016 with >8sec posttermination pauses s/p PPM implant     Pneumonia 1950   "double"   Presence of permanent cardiac pacemaker  Sleep apnea    "tested; mask ordered; couldn't afford it; will get it now" (09/04/2015)   TIA (transient ischemic attack)    Type II diabetes mellitus ([place])    Uterine cancer [facility]    age 80 with partial hysterectomy    Significant Hospital Events   9/24 Admission  Consults:  9/23 PCCM  Procedures:    Significant Diagnostic Tests:  Echo 12/09/2018 Left ventricular ejection fraction, by visual estimation, is 55%. The left ventricle has normal function. Normal left ventricular size. There is  mildly increased left ventricular hypertrophy. Global right ventricle has mildly reduced systolic function.The right ventricular size is mildly enlarged. No increase in right ventricular wall thickness. D-shaped interventricular septum suggestive of RV  pressure/volume overload. Moderate mitral annular calcification. The mitral valve is degenerative. No evidence of mitral valve regurgitation. No evidence of mitral stenosis. Left atrial size was mildly dilated. The inferior vena cava is dilated in size with <50% respiratory variability, suggesting right atrial pressure of 15 mmHg  02/2018 HRCT Spectrum of findings suggestive of fibrotic interstitial lung disease characterized by patchy ground-glass opacity and reticulation throughout both lungs without a clear apicobasilar gradient, new since 2012 chest CT. No significant bronchiectasis or frank honeycombing. Differential considerations include nonspecific interstitial pneumonia (NSIP) pattern (potentially secondary to amiodarone therapy) or chronic hypersensitivity pneumonitis given the presence of some air trapping. Findings are suggestive of an alternative diagnosis (not UIP) per consensus guidelines: bilateral fibrotic interstitial lung disease, patchy groundglass opacities and reticulation without a clear apical basilar gradient,Consistent with possible drug-induced ILD from amiodarone  PFT's 02/2018 Pulmonary Functions Testing Results: PFT Results Latest Ref Rng & Units 12/28/2017  FVC-Predicted Pre % 56  FVC-Post L 1.61  FVC-Predicted Post % 55  Pre FEV1/FVC % % 91  Post FEV1/FCV % % 91  FEV1-Pre L 1.49  FEV1-Predicted Pre % 68  FEV1-Post L 1.47  DLCO UNC% % 67  DLCO COR %Predicted % 121  TLC L 2.93  TLC % Predicted % 58  RV % Predicted % 52   Reduced lung volumes, and  increased FEV1/FVC ratio and diffusion defect suggest an interstitial process such as fibrosis or interstitial inflammation.Restrictive Disease  01/2016  Sleep Study  moderate sleep apnea overall within HI 15.4.  However, sleep apnea was severe during rems sleep at 36 per hour.  Her oxygen nadir was 83%.     07/2016: RESPIRATORY PARAMETERS Optimal PAP Pressure (cm):  15        AHI at Optimal Pressure (/hr):            0.0 Overall Minimal O2 (%):         89.00   Supine % at Optimal Pressure (%):    0 Minimal O2 at Optimal Pressure (%): 93.0  CPAP therapy with EPR of 3 at 15 cm H2O with heated humidification.   Micro Data:  12/08/2018>>  SARS Coronavirus 2 NEGATIVE NEGATIVE      Antimicrobials:  None   Interim history/subjective:  Pt is currently on RA with sats of 98% She is net negative 300 cc's since admission   Objective   Blood pressure (!) 128/58, pulse 89, temperature 97.8 F (36.6 C), temperature source Oral, resp. rate 19, weight 105 kg, SpO2 99 %.        Intake/Output Summary (Last 24 hours) at 12/10/2018 1039 Last data filed at 12/10/2018 0952 Gross per 24 hour  Intake 2090.59 ml  Output 1300 ml  Net 790.59 ml   [employer]  12/09/18 0551 12/09/18 1211 12/10/18 0225  Weight: 105.5 kg 105.9 kg 105 kg    Examination: General: Awake and alert, pleasant elderly female supine in bed on RA HENT: NCAT, Thick neck, No LAD, NO JVD, MM Pink and dry Lungs: Bilateral chest excursion, Clear with basilar crackles notes , Respirations are regular and unlabored. 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) HRCT + 02/2018 Amiodarone x 3 years 2016-02/2018 Worked in Char[employer] fundraiser  New progression of disease on most recent CXR 12/08/2018 Not candidate for surgical lung biopsy No Auto Immune work up Plan Will  HRCT to evaluate  progression of disease Supine and prone position Inspiratory and expiratory cuts Will have patient fill out ILD questionnaire Auto immune workup( ANA, DS DNA, RF,CCP,SCL-70, SSA, SSB, ESR, CK, Aldolase) Will consider role of steroids once HRCT and lab work results Will consider use of antifibrotic medications once work up complete Will need PFT's in the Pulmonary OP setting to evaluate assess for progression of disease. Pt. Scheduled to see Dr. Chase Caller , Tuesday Oct 13 at 3:15 pm  Will need 6 minute walk in pulmonary outpatient setting Ambulatory saturation test prior to discharge Will need close follow up in pulmonary office as outpatient at discharge   OSA  Untreated  >>> not compliant with CPAP AHI 15.4/h; RDI 19.7/h;  AHI during REM sleep 36/h. O2 desaturation nadir 83%. Moderate Sleep Apnea Plan CPAP at HS. Settings per above noted recommendations Daughter to bring home CPAP as patient unable to tolerate inpatient device Please have hybrid Dreamware mask ordered for patient in pulmonary office as I think this will be easier for her to tolerate. ( DME is [facility])  I have had a long conversation with Ms. Toutant regarding compliance with CPAP. We discussed that there are many factors contributing to her dyspnea. We discussed that the Role of CPAP therapy as a treatment allows her to control one of the many factors contributing to her shortness of breath. We discussed that when she does not use her CPAP she is placing an additional stress and burden on her already failing heart. We discussed how much harder the heart has to work with each episode of apnea per hour to circulate oxygen. We discussed the  risks of untreated sleep apnea to include stroke, HTN, CV disease, DM and pulmonary hypertension.  I have told her that CPAP is a necessity, not an option,  if she plans to have any significant longevity or  quality of life.  I have told her about the Hybrid Dreamware mask as an option. Several of our patient's have done well with this mask.She is interested in trying this at follow up with Pulmonary or with Cards (who manages her sleep apnea).  She verbalized understanding of the above.  There is a family history of RA in grandmother psoriatic arthritis in daughter  She will be completing the ILD Questionnaire  Today  Results: Shortness of breath symptoms Score >> 60 + productive cough at night No hemoptysis at present, but has coughed up blood in the past. 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 + Actor + Physiological scientist + Glass blower/designer + Engineer, manufacturing systems + Museum/gallery exhibitions officer + amiodarone + prednisone    Building surveyor:  Diet: Per Primary Team Pain/Anxiety/Delirium protocol (if indicated): Per Primary VAP protocol (if indicated): NA DVT prophylaxis: Eliquis GI prophylaxis: Per primary Glucose control: CBG's Mobility: OOB to chair Code Status: Full Family Communication: Updated patient at bedside Disposition: Progressive   Labs   CBC: Recent Labs  Lab 12/08/18 1641 12/09/18 0459 12/10/18 0245  WBC 9.5 9.3 8.8  NEUTROABS  --   --  4.3  HGB 12.0 11.5* 11.5*  HCT 36.7 35.7* 35.0*  MCV 93.9 93.5 93.6  PLT 286 287 611    Basic Metabolic Panel: Recent Labs  Lab 12/08/18 1641 12/09/18 0459 12/10/18 0245  NA 136 136 138  K 3.5 4.0 3.3*   CL 97* 97* 97*  CO2 26 27 32  GLUCOSE 219* 263* 216*  BUN _0 CREATININE 1.00 0.92 0.99  CALCIUM 9.2 9.1 8.9  MG  --  1.7 1.6*   GFR: Estimated Creatinine Clearance: 60.7 mL/min (by C-G formula based on SCr of 0.99 mg/dL). Recent Labs  Lab 12/08/18 1641 12/09/18 0459 12/10/18 0245  WBC 9.5 9.3 8.8    Liver Function Tests: No results for input(s): AST, ALT, ALKPHOS, BILITOT, PROT, ALBUMIN in the last 168 hours. No results for input(s): LIPASE, AMYLASE in the last 168 hours. No results for input(s): AMMONIA in the last 168 hours.  ABG    Component Value Date/Time   TCO2 24 06/04/2014 1103     Coagulation Profile: No results for input(s): INR, PROTIME in the last 168 hours.  Cardiac Enzymes: No results for input(s): CKTOTAL, CKMB, CKMBINDEX, TROPONINI in the last 168 hours.  HbA1C: Hgb A1c MFr Bld  Date/Time Value Ref Range Status  12/09/2018 04:59 AM 10.9 (H) 4.8 - 5.6 % Final    Comment:    (NOTE) Pre diabetes:          5.7%-6.4% Diabetes:              >6.4% Glycemic control for   <7.0% adults with diabetes   09/04/2015 03:39 PM 8.9 (H) 4.8 - 5.6 % Final    Comment:    (NOTE)         Pre-diabetes: 5.7 - 6.4         Diabetes: >6.4         Glycemic control for adults with diabetes: <7.0     CBG: Recent Labs  Lab 12/09/18 1107 12/09/18 1640 12/09/18 2146 12/10/18 0628 12/10/18 0818  GLUCAP 348* 286* 293* 175* 203*    Review of Systems:   Gen: Denies fever, chills, weight change, + fatigue, night sweats HEENT: Denies blurred vision, double vision, hearing loss, tinnitus, sinus congestion, rhinorrhea, sore throat, neck stiffness, dysphagia PULM: + shortness of breath, + cough, + sputum production, hemoptysis, + wheezing CV: Denies chest pain, + edema, + orthopnea, paroxysmal nocturnal + dyspnea, palpitations GI:  Denies abdominal pain, +nausea, vomiting, diarrhea, hematochezia, melena, constipation, change in bowel habits GU: Denies dysuria,  hematuria, polyuria, oliguria, urethral discharge Endocrine: Denies hot or cold intolerance, polyuria, polyphagia or appetite change Derm: Denies rash, dry skin, scaling or peeling skin change Heme: + easy bruising, No bleeding, bleeding gums Neuro: + occasional  Headache,No  numbness, weakness, slurred speech, loss of memory or consciousness  Past Medical History  She,  has a past medical history of Asthmatic bronchitis, CHF (congestive heart failure) ([place]) (dx'd 2016), Coronary atherosclerosis of native coronary artery, Hyperlipidemia, Hypertension, Hypertensive heart disease, Kidney stones, Mixed hyperlipidemia, Paroxysmal atrial fibrillation ([place]), Pneumonia (1950), Presence of permanent cardiac pacemaker, Sleep apnea, TIA (transient ischemic attack), Type II diabetes mellitus ([place]), and Uterine cancer ([place]).   Surgical History    Past Surgical History:  Procedure Laterality Date   CARDIAC CATHETERIZATION  2005   CORONARY ARTERY BYPASS GRAFT  2005   Dr Roxy Manns with MAZE   CYSTOSCOPY WITH URETEROSCOPY, STONE BASKETRY AND STENT PLACEMENT Left 09/08/2014   Procedure: CYSTOSCOPY WITH URETEROSCOPY, STONE BASKETRY AND STENT PLACEMENT;  Surgeon: Cleon Gustin, MD;  Location: [facility];  Service: Urology;  Laterality: Left;   CYSTOSCOPY/URETEROSCOPY/HOLMIUM LASER/STENT PLACEMENT Left 09/15/2014   Procedure: CYSTOSCOPY/RETROGRADE/URETEROSCOPY/HOLMIUM LASER/ STONE EXTRACTION /STENT EXCHANGE;  Surgeon: Cleon Gustin, MD;  Location: [facility];  Service: Urology;  Laterality: Left;   HOLMIUM LASER APPLICATION Left 6/33/3545   Procedure: HOLMIUM LASER APPLICATION;  Surgeon: Cleon Gustin, MD;  Location: [facility];  Service: Urology;  Laterality: Left;   HOLMIUM LASER APPLICATION Left 08/16/5636   Procedure: HOLMIUM LASER APPLICATION;  Surgeon: Cleon Gustin, MD;  Location: [facility];  Service: Urology;  Laterality: Left;   INSERT / REPLACE / REMOVE PACEMAKER     LAPAROSCOPIC CHOLECYSTECTOMY      PERMANENT PACEMAKER INSERTION N/A 06/05/2014   MDT Advisa 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 She has never used smokeless tobacco. She reports that she does not drink alcohol or use drugs.   Family History   Her family history includes Arthritis in her brother; Diabetes in her father; Heart attack in her maternal grandfather; Heart disease in her father and mother; Hyperlipidemia in her mother; Hypertension in her daughter, mother, and sister; Stroke in her father and paternal grandfather.   Allergies No Known Allergies   Home Medications  Prior to Admission medications   Medication Sig Start Date End Date Taking? Authorizing Provider  acetaminophen (TYLENOL) 500 MG tablet Take 500-1,000 mg by mouth every 6 (six) hours as needed for moderate pain.   Yes [provider]  apixaban (ELIQUIS) 5 MG TABS tablet Take 1 tablet (5 mg total) by mouth 2 (two) times daily. Take 1st dose 06-07-14 06/06/14  Yes Seiler, Luetta Nutting K, NP  aspirin 81 MG tablet Take 81 mg by mouth daily.    Yes [provider]  B Complex-Biotin-FA (B-COMPLEX PO) Take 1 capsule by mouth every morning.    Yes [provider]  BIOTIN PO Take 1 tablet by mouth daily.   Yes [provider]  Cholecalciferol (VITAMIN D3) 1000 UNITS CAPS Take 1,000 Units by mouth daily.   Yes [provider]  Coenzyme Q10 (CO Q-10) 200 MG CAPS Take 200 mg by mouth daily.   Yes [provider]  diphenhydramine-acetaminophen (TYLENOL PM) 25-500 MG TABS tablet Take 1 tablet  by mouth at bedtime as needed (sleep).   Yes [provider]  doxycycline (VIBRA-TABS) 100 MG tablet Take 1 tablet (100 mg total) by mouth 2 (two) times daily. 12/08/18  Yes Martyn Ehrich, NP  ezetimibe (ZETIA) 10 MG tablet Take 10 mg by mouth daily.   Yes [provider]  fenofibrate 160 MG  tablet TAKE 1 TABLET DAILY 10/14/18  Yes Troy Sine, MD  HUMULIN 70/30 KWIKPEN (70-30) 100 UNIT/ML PEN Inject 35 Units into the muscle 2 (two) times daily at 8 am and 10 pm. 01/14/18  Yes [provider]  losartan (COZAAR) 50 MG tablet TAKE 1 TABLET UPTO TWICE A DAY AS DIRECTED Patient taking differently: Take 50 mg by mouth daily.  08/26/18  Yes Troy Sine, MD  metFORMIN (GLUCOPHAGE) 500 MG tablet Take 500 mg by mouth 2 (two) times daily with a meal.    Yes [provider]  metoprolol succinate (TOPROL-XL) 100 MG 24 hr tablet Take 1 tablet daily with or immediatelyfollowing a meal. Patient taking differently: Take 100 mg by mouth daily.  02/19/17  Yes Troy Sine, MD  Omega-3 Fatty Acids (FISH OIL PO) Take 2 capsules by mouth 2 (two) times daily.   Yes [provider]  potassium chloride (K-DUR,KLOR-CON) 10 MEQ tablet Take 10 mEq by mouth every morning.    Yes [provider]  Semaglutide (RYBELSUS) 3 MG TABS Take by mouth.   Yes [provider]     Critical care time: 18 minutes    Magdalen Spatz, MSN, AGACNP-BC [place] Pager # [phone] After 4 pm please call [phone] 12/10/2018 2:26 PM

## 2018-12-10 NOTE — Progress Notes (Addendum)
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 Bess Harvest 08/2014 Last admission 09/05/2015 A. fib RVR Present [facility] 9/23 2020 DOE orthopnea nausea LE edema-CBG~300 range-A. fib RVR in ED-Rx bolus diltiazem also AECHF systolic-Lasix  Assessment & Plan:   A. fib RVR chads score >5 Still has RVR-increased metoprolol XL dose 100 Convert Cardizem gtt 20/H-->480 XL daily, monitor on telemetry Likely diastolic heart failure-baseline weight is 106 kg in outpatient setting Patient claims noncompliance with twice daily dosing of 40 Lasix-says she only takes 1 typically Reinforced compliance-weight still [+]-1200 cc fluid restriction-see below re: Echo I am discontinuing ARB 9/25 to facilitate diuresis OSA not on CPAP Noncompliant on CPAP/home-trial CPAP in hospital She will need follow-up with Dr. 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 Steroids-advanced therpies?  DVT prophylaxis: Apixaban code Status:   Full   family Communication:   Daughter and I discussed plan of care extensively on 9/24 disposition Plan: Inpatient pending resolution   Consultants:   Pulmonology  Procedures:    Echocardiogram 9/24 IMPRESSIONS   1. Left ventricular ejection fraction, by visual estimation, is 55%. The left ventricle has normal function. Normal left ventricular size. There is mildly increased left ventricular hypertrophy. 2. Definity contrast agent was given IV to delineate the left ventricular endocardial borders. 3. Left ventricular diastolic Doppler parameters are indeterminate pattern of LV diastolic filling. 4. The RV was poorly visualized. Global right ventricle has mildly reduced systolic function.The right ventricular size is mildly enlarged. No increase in right ventricular wall thickness. D-shaped interventricular septum suggestive of RV  pressure/volume overload. 5. Moderate mitral annular calcification. 6. The mitral valve is degenerative. No evidence of mitral valve regurgitation. No evidence of mitral stenosis. 7. The tricuspid valve is normal in structure. Tricuspid valve regurgitation is trivial. 8. The aortic valve is tricuspid Aortic valve regurgitation is trivial by color flow Doppler. Mild to moderate aortic valve sclerosis/calcification without any evidence of aortic stenosis. 9. There is mild dilatation of the ascending aorta measuring 37 mm. 10. A pacer wire is visualized. 11. Left atrial size was mildly dilated. 12. The inferior vena cava is dilated in size with <50% respiratory variability, suggesting right atrial pressure of 15 mmHg. 13. The tricuspid regurgitant velocity is 2.73 m/s, and with an assumed right atrial pressure of 15 mmHg, the estimated right ventricular systolic pressure is moderately elevated at 44.8 mmHg. 14. Right atrial size was not well visualized.  Antimicrobials:   No  Subjective: Awake alert coherent no distress tells me she has been having several months of shortness of breath prior to coming in which culminated in admission No fever chills DOE is present however with  minimal exertion-no sputum  Objective:        Vitals:   12/10/18 0350 12/10/18 0400 12/10/18 0730 12/10/18 0900  BP:  (!) 84/50 (!) 123/55 139/69  Pulse:  60 (!) 48 63  Resp:  _0 Temp: 98 F (36.7 C)  97.8 F (36.6 C)   TempSrc: Oral  Oral   SpO2:  96% 97% 99%  Weight:        Intake/Output Summary (Last 24 hours) at 12/10/2018 0938 Last data filed at 12/10/2018 0751    Gross per 24 hour  Intake 1970.59 ml  Output 1000 ml  Net 970.59 ml        Filed Weights   12/09/18 0551 12/09/18 1211 12/10/18 0225  Weight: 105.5 kg 105.9 kg 105 kg    Examination: EOMI NCAT thick neck Mallampati 4 obese habitus No submandibular LA N, no thyromegaly Clinically clear posterolaterally in the chest cannot appreciate rales rhonchi Cannot appreciate JVD but habitus precludes good exam Abdomen is obese nontender no rebound no guarding Some lower extremity edema Neurologically intact moving all 4 limbs Skin soft supple  Data Reviewed: I have personally reviewed following labs and imaging studies  CBC: Last Labs        Recent Labs  Lab 12/08/18 1641 12/09/18 0459 12/10/18 0245  WBC 9.5 9.3 8.8  NEUTROABS  --   --  4.3  HGB 12.0 11.5* 11.5*  HCT 36.7 35.7* 35.0*  MCV 93.9 93.5 93.6  PLT 286 287 289     Basic Metabolic Panel: Last Labs        Recent Labs  Lab 12/08/18 1641 12/09/18 0459 12/10/18 0245  NA 136 136 138  K 3.5 4.0 3.3*  CL 97* 97* 97*  CO2 26 27 32  GLUCOSE 219* 263* 216*  BUN _1 CREATININE 1.00 0.92 0.99  CALCIUM 9.2 9.1 8.9  MG  --  1.7 1.6*     GFR: Estimated Creatinine Clearance: 60.7 mL/min (by C-G formula based on SCr of 0.99 mg/dL). Liver Function Tests: Last Labs   No results for input(s): AST, ALT, ALKPHOS, BILITOT, PROT, ALBUMIN in the last 168 hours.   Last Labs   No results for input(s): LIPASE, AMYLASE in the last 168 hours.   Last Labs   No results for input(s): AMMONIA in the last 168 hours.   Coagulation Profile: Last Labs   No results for  input(s): INR, PROTIME in the last 168 hours.   Cardiac Enzymes:  Radiology Studies: Reviewed images personally in health database   Scheduled Meds: . apixaban  5 mg Oral BID  . aspirin  81 mg Oral Daily  . ezetimibe  10 mg Oral Daily  . fenofibrate  160 mg Oral Daily  . furosemide  60 mg Intravenous Q12H  . insulin aspart  0-15 Units Subcutaneous TID WC  . insulin detemir  30 Units Subcutaneous BID  . metFORMIN  500 mg Oral BID WC  . metoprolol succinate  100 mg Oral Daily  . potassium chloride  40 mEq Oral Daily  . Semaglutide  3 mg Oral q morning - 10a   Continuous Infusions: . diltiazem (CARDIZEM) infusion 20 mg/hr (12/10/18 0507)  . magnesium sulfate bolus IVPB 2 g (12/10/18 0842)     LOS: 2 days    Time spent: [place], MD [facility] ([facility]  If 7PM-7AM, please contact night-coverage [email or web address] Password [facility] 12/10/2018, 9:38 AM

## 2018-12-11 ENCOUNTER — Inpatient Hospital Stay (HOSPITAL_COMMUNITY): Payer: Medicare Other

## 2018-12-11 LAB — CBC WITH DIFFERENTIAL/PLATELET
Abs Immature Granulocytes: 0.02 10*3/uL (ref 0.00–0.07)
Basophils Absolute: 0 10*3/uL (ref 0.0–0.1)
Basophils Relative: 0 %
Eosinophils Absolute: 0.2 10*3/uL (ref 0.0–0.5)
Eosinophils Relative: 3 %
HCT: 37.7 % (ref 36.0–46.0)
Hemoglobin: 12.4 g/dL (ref 12.0–15.0)
Immature Granulocytes: 0 %
Lymphocytes Relative: 39 %
Lymphs Abs: 3.5 10*3/uL (ref 0.7–4.0)
MCH: 30.6 pg (ref 26.0–34.0)
MCHC: 32.9 g/dL (ref 30.0–36.0)
MCV: 93.1 fL (ref 80.0–100.0)
Monocytes Absolute: 0.6 10*3/uL (ref 0.1–1.0)
Monocytes Relative: 7 %
Neutro Abs: 4.6 10*3/uL (ref 1.7–7.7)
Neutrophils Relative %: 51 %
Platelets: 282 10*3/uL (ref 150–400)
RBC: 4.05 MIL/uL (ref 3.87–5.11)
RDW: 15.3 % (ref 11.5–15.5)
WBC: 9 10*3/uL (ref 4.0–10.5)
nRBC: 0 % (ref 0.0–0.2)

## 2018-12-11 LAB — COMPREHENSIVE METABOLIC PANEL
ALT: 24 U/L (ref 0–44)
AST: 50 U/L — ABNORMAL HIGH (ref 15–41)
Albumin: 3.7 g/dL (ref 3.5–5.0)
Alkaline Phosphatase: 55 U/L (ref 38–126)
Anion gap: 15 (ref 5–15)
BUN: 22 mg/dL (ref 8–23)
CO2: 23 mmol/L (ref 22–32)
Calcium: 9.4 mg/dL (ref 8.9–10.3)
Chloride: 99 mmol/L (ref 98–111)
Creatinine, Ser: 1.03 mg/dL — ABNORMAL HIGH (ref 0.44–1.00)
GFR calc Af Amer: >60 mL/min (ref >60)
GFR calc non Af Amer: 54 mL/min — ABNORMAL LOW (ref >60)
Glucose, Bld: 197 mg/dL — ABNORMAL HIGH (ref 70–99)
Potassium: 3.9 mmol/L (ref 3.5–5.1)
Sodium: 137 mmol/L (ref 135–145)
Total Bilirubin: 1 mg/dL (ref 0.3–1.2)
Total Protein: 7.1 g/dL (ref 6.5–8.1)

## 2018-12-11 LAB — GLUCOSE, CAPILLARY
Glucose-Capillary: 187 mg/dL — ABNORMAL HIGH (ref 70–99)
Glucose-Capillary: 200 mg/dL — ABNORMAL HIGH (ref 70–99)
Glucose-Capillary: 270 mg/dL — ABNORMAL HIGH (ref 70–99)
Glucose-Capillary: 296 mg/dL — ABNORMAL HIGH (ref 70–99)

## 2018-12-11 LAB — ANTI-SCLERODERMA ANTIBODY: Scleroderma (Scl-70) (ENA) Antibody, IgG: <0.2 AI (ref 0.0–0.9)

## 2018-12-11 LAB — ANGIOTENSIN CONVERTING ENZYME: Angiotensin-Converting Enzyme: 66 U/L (ref 14–82)

## 2018-12-11 LAB — ALDOLASE: Aldolase: 7.1 U/L (ref 3.3–10.3)

## 2018-12-11 LAB — RHEUMATOID FACTOR: Rheumatoid fact SerPl-aCnc: <10 IU/mL (ref 0.0–13.9)

## 2018-12-11 LAB — SJOGRENS SYNDROME-A EXTRACTABLE NUCLEAR ANTIBODY: SSA (Ro) (ENA) Antibody, IgG: <0.2 AI (ref 0.0–0.9)

## 2018-12-11 LAB — ANA W/REFLEX IF POSITIVE: Anti Nuclear Antibody (ANA): NEGATIVE

## 2018-12-11 MED ORDER — ALPRAZOLAM 0.25 MG PO TABS
0.2500 mg | ORAL_TABLET | Freq: Once | ORAL | Status: AC
  Administered 2018-12-11: 0.25 mg via ORAL
  Filled 2018-12-11: qty 1

## 2018-12-11 NOTE — Progress Notes (Signed)
Pt ambulated in a hallway almost 200 ft, without any distress, SOB and CP.  HR maintained below 90's. Pulse ox was between 90-95 on RA while ambulating.  Will continue to monitor  Palma Holter, RN

## 2018-12-11 NOTE — Plan of Care (Signed)

## 2018-12-11 NOTE — Evaluation (Addendum)
Physical Therapy Evaluation Patient Details Name: [name] MRN: [id number] DOB: January 31, 1946 Today's Date: 12/11/2018   History of Present Illness  Present [facility] ED 9/23 2020 DOE orthopnea nausea LE edema-CBG~300 range-A. fib RVR in ED-Rx bolus diltiazem also AECHF systolic-Lasix.  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 Bess Harvest 08/2014  Clinical Impression  Pt admitted with above diagnosis. Pt was able to ambulate with min guard assist  with RW and rollator of which she prefers rollator.  Pt with no LOB as well.  Pt educated in technique of locking brakes and returns demonstration.  Will work on balance activities if pt doesn't d/c. Pt currently with functional limitations due to the deficits listed below (see PT Problem List). Pt will benefit from skilled PT to increase their independence and safety with mobility to allow discharge to the venue listed below.      Follow Up Recommendations No PT follow up;Supervision/Assistance - 24 hour    Equipment Recommendations  Other (comment)(rollator)    Recommendations for Other Services       Precautions / Restrictions Precautions Precautions: Fall Restrictions Weight Bearing Restrictions: No      Mobility  Bed Mobility Overal bed mobility: Independent                Transfers Overall transfer level: Independent                  Ambulation/Gait Ambulation/Gait assistance: Min guard Gait Distance (Feet): 150 Feet Assistive device: Rolling walker (2 wheeled);4-wheeled walker Gait Pattern/deviations: Step-through pattern;Decreased stride length   Gait velocity interpretation: 1.31 - 2.62 ft/sec, indicative of limited community ambulator General Gait Details: Started walking with the RW but had pt try the rollator as well.  Pt did well with gait.  No LOB with either device.  Prefers the rollator.    Stairs            Wheelchair Mobility    Modified Rankin (Stroke Patients Only)       Balance                                             Pertinent Vitals/Pain Pain Assessment: No/denies pain    Home Living Family/patient expects to be discharged to:: Private residence Living Arrangements: Spouse/significant other;Children Available Help at Discharge: Family;Friend(s);Available 24 hours/day Type of Home: House Home Access: Stairs to enter Entrance Stairs-Rails: Right Entrance Stairs-Number of Steps: 4 Home Layout: Laundry or work area in basement;Two level Home Equipment: Bedside commode;Shower seat;Hand held shower head;Wheelchair - manual;Cane - single point Additional Comments: adjustable bed    Prior Function Level of Independence: Independent               Hand Dominance   Dominant Hand: Right    Extremity/Trunk Assessment   Upper Extremity Assessment Upper Extremity Assessment: Defer to OT evaluation    Lower Extremity Assessment Lower Extremity Assessment: Generalized weakness    Cervical / Trunk Assessment Cervical / Trunk Assessment: Normal  Communication   Communication: No difficulties  Cognition Arousal/Alertness: Awake/alert Behavior During Therapy: WFL for tasks assessed/performed Overall Cognitive Status: Within Functional Limits for tasks assessed  General Comments General comments (skin integrity, edema, etc.): 66 bpm, 99% on RA, 12     Exercises     Assessment/Plan    PT Assessment (P) Patient needs continued PT services  PT Problem List Decreased activity tolerance;Decreased balance;Decreased mobility;Decreased knowledge of use of DME;Decreased safety awareness;Decreased knowledge of precautions       PT Treatment Interventions      PT Goals (Current goals can be found in the Care Plan section)  Acute Rehab PT Goals Patient Stated Goal: to  go home PT Goal Formulation: With patient Time For Goal Achievement: 12/25/18 Potential to Achieve Goals: Good    Frequency     Barriers to discharge        Co-evaluation               AM-PAC PT "6 Clicks" Mobility  Outcome Measure Help needed turning from your back to your side while in a flat bed without using bedrails?: None Help needed moving from lying on your back to sitting on the side of a flat bed without using bedrails?: None Help needed moving to and from a bed to a chair (including a wheelchair)?: None Help needed standing up from a chair using your arms (e.g., wheelchair or bedside chair)?: None Help needed to walk in hospital room?: A Little Help needed climbing 3-5 steps with a railing? : A Little 6 Click Score: 22    End of Session Equipment Utilized During Treatment: Gait belt Activity Tolerance: Patient tolerated treatment well Patient left: in bed;with call bell/phone within reach;with family/visitor present(sitting on EOB) Nurse Communication: Mobility status PT Visit Diagnosis: Muscle weakness (generalized) (M62.81)    Time: 0148-4039 PT Time Calculation (min) (ACUTE ONLY): 20 min   Charges:   PT Evaluation $PT Eval Moderate Complexity: [place] Starlit Raburn,PT Acute Rehabilitation Services Pager:  [phone]  Office:  [place] 12/11/2018, 3:24 PM

## 2018-12-11 NOTE — Progress Notes (Signed)
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 Bess Harvest 08/2014 Last admission 09/05/2015 A. fib RVR Present [facility] ED 9/23 2020 DOE orthopnea nausea LE edema-CBG~300 range-A. fib RVR in ED-Rx bolus diltiazem also AECHF systolic-Lasix  Assessment & Plan:   A. fib RVR chads score >5 RVR improved-metoprolol XL dose 100 Cardizem 480 XL daily, monitor on telemetry-a.m. K, MG  HFpEF acute decompensated, cor pulmonale-baseline weight is 106 kg in outpatient setting noncompliance ii/day 40 Lasix-says she only takes 1 typically weight still [+]-1200 cc fluid restriction, echo as below DC ARB 9/25 to facilitate diuresis OSA not on CPAP Pulmonology consulted-wearing for 1 hour at night now-outpatient mask-RN aware to contact RT regarding different mask Prior TIA Unclear circumstances-continue apixaban 5 twice daily CABG 2005 Re-stratify as an outpatient with labs continue fenofibrate 160, Zetia 10 DM ty II (home dose 35 Levemir twice daily 7030) Increasing Levemir to 30 twice daily-CBG 200-1 87 currently-continue metformin 500 ii/day + semaglutide 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 Left ventricular ejection fraction, by visual estimation, is 55%. The left ventricle has normal function. Normal left ventricular size. There is mildly increased left ventricular hypertrophy.  2. Definity  contrast agent was given IV to delineate the left ventricular endocardial borders.  3. Left ventricular diastolic Doppler parameters are indeterminate pattern of LV diastolic filling.  4. The RV was poorly visualized. Global right ventricle has mildly reduced systolic function.The right ventricular size is mildly enlarged. No increase in right ventricular wall thickness. D-shaped interventricular septum suggestive of RV  pressure/volume overload.  5. Moderate mitral annular calcification.  6. The mitral valve is degenerative. No evidence of mitral valve regurgitation. No evidence of mitral stenosis.  7. The tricuspid valve is normal in structure. Tricuspid valve regurgitation is trivial.  8. The aortic valve is tricuspid Aortic valve regurgitation is trivial by color flow Doppler. Mild to moderate aortic valve sclerosis/calcification without any evidence of aortic stenosis.  9. There is mild dilatation of the ascending aorta measuring 37 mm. 10. A pacer wire is visualized. 11. Left atrial size was mildly dilated. 12. The inferior vena cava is dilated in size with <50% respiratory variability, suggesting right atrial pressure of 15 mmHg. 13. The tricuspid regurgitant velocity is 2.73 m/s, and with an assumed right atrial pressure of 15 mmHg, the estimated right ventricular systolic pressure is moderately elevated at 44.8 mmHg. 14. Right atrial size was not well visualized.  Antimicrobials:   No  Subjective:  Coherent looks much better  Objective: Vitals:   12/11/18 0400 12/11/18 0500 12/11/18 0722 12/11/18 1148  BP: 123/66 113/71 128/65 137/73  Pulse: 77 65 68 60  Resp: _0 Temp:   98 F (36.7 C) 98 F (36.7 C)  TempSrc:   Oral Oral  SpO2: 97% 94% 100% 98%  Weight:        Intake/Output Summary (Last 24  hours) at 12/11/2018 1157 Last data filed at 12/11/2018 0841 Gross per 24 hour  Intake 605.84 ml  Output 1500 ml  Net -894.16 ml   Filed Weights   12/09/18 1211  12/10/18 0225 12/11/18 0100  Weight: 105.9 kg 105 kg 105.5 kg    Examination:  EOMI NCAT thick neck Mallampati 4 obese habitus, no jvd No LAn no thyromeg cta b Cannot appreciate JVD but habitus precludes good exam Abdomen is obese nontender no rebound no guarding Some lower extremity edema Neurologically intact moving all 4 limbs Skin soft supple  Data Reviewed: I have personally reviewed following labs and imaging studies  CBC: Recent Labs  Lab 12/08/18 1641 12/09/18 0459 12/10/18 0245 12/11/18 0217  WBC 9.5 9.3 8.8 9.0  NEUTROABS  --   --  4.3 4.6  HGB 12.0 11.5* 11.5* 12.4  HCT 36.7 35.7* 35.0* 37.7  MCV 93.9 93.5 93.6 93.1  PLT 286 287 289 412   Basic Metabolic Panel: Recent Labs  Lab 12/08/18 1641 12/09/18 0459 12/10/18 0245 12/11/18 0217  NA 136 136 138 137  K 3.5 4.0 3.3* 3.9  CL 97* 97* 97* 99  CO2 26 27 32 23  GLUCOSE 219* 263* 216* 197*  BUN _0 CREATININE 1.00 0.92 0.99 1.03*  CALCIUM 9.2 9.1 8.9 9.4  MG  --  1.7 1.6*  --    GFR: Estimated Creatinine Clearance: 58.5 mL/min (A) (by C-G formula based on SCr of 1.03 mg/dL (H)). Liver Function Tests: Recent Labs  Lab 12/11/18 0217  AST 50*  ALT 24  ALKPHOS 55  BILITOT 1.0  PROT 7.1  ALBUMIN 3.7   No results for input(s): LIPASE, AMYLASE in the last 168 hours. No results for input(s): AMMONIA in the last 168 hours. Coagulation Profile: No results for input(s): INR, PROTIME in the last 168 hours. Cardiac Enzymes:  Radiology Studies: Reviewed images personally in health database   Scheduled Meds: . apixaban  5 mg Oral BID  . aspirin  81 mg Oral Daily  . diltiazem  480 mg Oral Daily  . ezetimibe  10 mg Oral Daily  . fenofibrate  160 mg Oral Daily  . furosemide  60 mg Intravenous Q12H  . insulin aspart  0-15 Units Subcutaneous TID WC  . insulin detemir  30 Units Subcutaneous BID  . metFORMIN  500 mg Oral BID WC  . metoprolol succinate  100 mg Oral Daily  . potassium chloride   40 mEq Oral Daily  . Semaglutide  3 mg Oral q morning - 10a   Continuous Infusions: . diltiazem (CARDIZEM) infusion Stopped (12/10/18 1205)     LOS: 3 days    Time spent: [place], MD [facility] ([facility]  If 7PM-7AM, please contact night-coverage [email or web address] Password [facility] 12/11/2018, 11:57 AM

## 2018-12-12 LAB — BASIC METABOLIC PANEL
Anion gap: 11 (ref 5–15)
BUN: 22 mg/dL (ref 8–23)
CO2: 29 mmol/L (ref 22–32)
Calcium: 9.3 mg/dL (ref 8.9–10.3)
Chloride: 98 mmol/L (ref 98–111)
Creatinine, Ser: 0.94 mg/dL (ref 0.44–1.00)
GFR calc Af Amer: >60 mL/min (ref >60)
GFR calc non Af Amer: >60 mL/min (ref >60)
Glucose, Bld: 177 mg/dL — ABNORMAL HIGH (ref 70–99)
Potassium: 3.5 mmol/L (ref 3.5–5.1)
Sodium: 138 mmol/L (ref 135–145)

## 2018-12-12 LAB — CBC WITH DIFFERENTIAL/PLATELET
Abs Immature Granulocytes: 0.02 10*3/uL (ref 0.00–0.07)
Basophils Absolute: 0.1 10*3/uL (ref 0.0–0.1)
Basophils Relative: 1 %
Eosinophils Absolute: 0.2 10*3/uL (ref 0.0–0.5)
Eosinophils Relative: 3 %
HCT: 36 % (ref 36.0–46.0)
Hemoglobin: 11.4 g/dL — ABNORMAL LOW (ref 12.0–15.0)
Immature Granulocytes: 0 %
Lymphocytes Relative: 39 %
Lymphs Abs: 3.7 10*3/uL (ref 0.7–4.0)
MCH: 29.8 pg (ref 26.0–34.0)
MCHC: 31.7 g/dL (ref 30.0–36.0)
MCV: 94 fL (ref 80.0–100.0)
Monocytes Absolute: 0.7 10*3/uL (ref 0.1–1.0)
Monocytes Relative: 7 %
Neutro Abs: 4.8 10*3/uL (ref 1.7–7.7)
Neutrophils Relative %: 50 %
Platelets: 285 10*3/uL (ref 150–400)
RBC: 3.83 MIL/uL — ABNORMAL LOW (ref 3.87–5.11)
RDW: 15.3 % (ref 11.5–15.5)
WBC: 9.5 10*3/uL (ref 4.0–10.5)
nRBC: 0 % (ref 0.0–0.2)

## 2018-12-12 LAB — GLUCOSE, CAPILLARY
Glucose-Capillary: 141 mg/dL — ABNORMAL HIGH (ref 70–99)
Glucose-Capillary: 173 mg/dL — ABNORMAL HIGH (ref 70–99)
Glucose-Capillary: 261 mg/dL — ABNORMAL HIGH (ref 70–99)
Glucose-Capillary: 213 mg/dL — ABNORMAL HIGH (ref 70–99)

## 2018-12-12 LAB — MPO/PR-3 (ANCA) ANTIBODIES
ANCA Proteinase 3: <3.5 U/mL (ref 0.0–3.5)
Myeloperoxidase Abs: <9 U/mL (ref 0.0–9.0)

## 2018-12-12 MED ORDER — FUROSEMIDE 40 MG PO TABS
40.0000 mg | ORAL_TABLET | Freq: Two times a day (BID) | ORAL | Status: DC
  Administered 2018-12-13: 08:00:00 40 mg via ORAL
  Filled 2018-12-12: qty 1

## 2018-12-12 MED ORDER — FUROSEMIDE 20 MG PO TABS: 20.0000 mg | ORAL_TABLET | Freq: Every day | ORAL | Status: DC

## 2018-12-12 NOTE — Plan of Care (Signed)

## 2018-12-12 NOTE — Progress Notes (Signed)
Pt. Has home cpap. Places on herself. RT informed pt. To notify if she needs any assistance.

## 2018-12-12 NOTE — Progress Notes (Signed)
SATURATION QUALIFICATIONS: (This note is used to comply with regulatory documentation for home oxygen)  Patient Saturations on Room Air at Rest = 95%  Patient Saturations on Room Air while Ambulating = 93 %  Pt tolerated well ambulating in a hallway, sat dropped to 88 once time but quickly backed up to 93. Pt denies chest pain, SOB and distress  Will continue to monitor the patient  Palma Holter, RN

## 2018-12-12 NOTE — Progress Notes (Signed)
PCCM: I appreciate you speaking with her. [facility] 12/12/2018 1:13 PM

## 2018-12-12 NOTE — Progress Notes (Signed)
PROGRESS NOTE    [name]  QTM:[id number] DOB: 08-03-1945 DOA: 12/08/2018 PCP: Wannetta Sender, FNP      Brief Narrative:  [name] is a 73 y.o. F with hx cAF on Eliquis, s/p MAZE procedure, pacer in place, CAD s/p CABG 2005, HTN, hx TIA, dCHF, ILD, DM and OSA not on CPAP who presented with 2 weeks progressive dyspnea.  In the ER, HR was 140s and she was started on diltiazem infusion and IV Lasix.  CXR unremarkable.        Assessment & Plan:  Atrial fibrillation with RVR HR controlled. -Continue Eliquis -Continue metoprolol, diltiazem   Acute on chronic diastolic CHF Cor pulmonale Net -600 cc yesterday, 1.7 L on admission.  Creatinine and potassium stable. Symptoms improved but swelling still noted clinically. 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  Pulmonology consulted this admission, they recommend broadening differential, checking autoimmune serologies, ANCAs, fungal Abs screening for hypersensitivity, and HRCT supine and prone inspiratory and expiratory images, followed by outpatient follow-up.  They recommend holding steroids until diagnostic work-up is been completed.  HRCT unchanged from Dec 2019. -Pulm follow up in 2 eweks scheduled  Diabetes Glucose is elevated -Continue metformin -Continue Levemir -Continue semaflutide -Continue SSI corrections  Coronary disease and cerebrovascular disease secondary prevention -Continue baby aspirin -Continue Zetia, fib rate  Sleep apnea not on CPAP -Continue CPAP at night  Hypomagnesemia Supplemented         MDM and disposition: The below labs and imaging reports were reviewed and summarized above.  Medication management as above.  The patient was admitted with dyspnea from A. fib with RVR and acute on chronic diastolic CHF.  She has been diuresed with  IV Lasix, we will continue IV Lasix today due to persistent swelling, likely home tomorrow.    DVT prophylaxis: N/A, on Eliquis Code Status: Full code Family Communication: Daughter by phone    Consultants:   Pulmonlogy  Procedures:   Echocardiogram 9/24  1. Left ventricular ejection fraction, by visual estimation, is 55%. The left ventricle has normal function. Normal left ventricular size. There is mildly increased left ventricular hypertrophy.  2. Definity contrast agent was given IV to delineate the left ventricular endocardial borders.  3. Left ventricular diastolic Doppler parameters are indeterminate pattern of LV diastolic filling.  4. The RV was poorly visualized. Global right ventricle has mildly reduced systolic function.The right ventricular size is mildly enlarged. No increase in right ventricular wall thickness. D-shaped interventricular septum suggestive of RV  pressure/volume overload.  5. Moderate mitral annular calcification.  6. The mitral valve is degenerative. No evidence of mitral valve regurgitation. No evidence of mitral stenosis.  7. The tricuspid valve is normal in structure. Tricuspid valve regurgitation is trivial.  8. The aortic valve is tricuspid Aortic valve regurgitation is trivial by color flow Doppler. Mild to moderate aortic valve sclerosis/calcification without any evidence of aortic stenosis.  9. There is mild dilatation of the ascending aorta measuring 37 mm. 10. A pacer wire is visualized. 11. Left atrial size was mildly dilated. 12. The inferior vena cava is dilated in size with <50% respiratory variability, suggesting right atrial pressure of 15 mmHg. 13. The tricuspid regurgitant velocity is 2.73 m/s, and with an assumed right atrial pressure of 15 mmHg, the estimated right ventricular systolic pressure is moderately elevated at 44.8 mmHg. 14. Right atrial size  was not well visualized.  Antimicrobials:   None    Subjective: Feeling  somewhat better.  Less dyspnea with exertion, no orthopnea.  Swelling persists in legs.  No vomiting, appetite good.  No fever.    Objective: Vitals:   12/12/18 0000 12/12/18 0325 12/12/18 0647 12/12/18 0727  BP:  (!) 127/59  127/63  Pulse: 60 67  68  Resp: _0 Temp:  (!) 97.4 F (36.3 C)  (!) 97.5 F (36.4 C)  TempSrc:  Oral  Oral  SpO2: 94% 97%  97%  Weight:   105.5 kg     Intake/Output Summary (Last 24 hours) at 12/12/2018 1014 Last data filed at 12/12/2018 1000 Gross per 24 hour  Intake 440 ml  Output 1450 ml  Net -1010 ml   Filed Weights   12/11/18 0100 12/11/18 1527 12/12/18 0647  Weight: 105.5 kg 105.1 kg 105.5 kg    Examination: General appearance:  adult female, alert and in no acute distress.   HEENT: Anicteric, conjunctiva pink, lids and lashes normal. No nasal deformity, discharge, epistaxis.  Lips moist, dentition normal, OP moist, no oral lesions.   Skin: Warm and dry.  No jaundice.  No suspicious rashes or lesions. Cardiac: RRR, nl S1-S2, no murmurs appreciated.  Capillary refill is brisk.  JVP not visible.  1+ bilateral LE edema.  Radial pulses 2+ and symmetric. Respiratory: Normal respiratory rate and rhythm.  No wheezing, faint crackles at bases. Abdomen: Abdomen soft.  No TTP. No ascites, distension, hepatosplenomegaly.   MSK: No deformities or effusions. Neuro: Awake and alert.  EOMI, moves all extremities. Speech fluent.    Psych: Sensorium intact and responding to questions, attention normal. Affect normal.  Judgment and insight appear normal.    Data Reviewed: I have personally reviewed following labs and imaging studies:  CBC: Recent Labs  Lab 12/08/18 1641 12/09/18 0459 12/10/18 0245 12/11/18 0217 12/12/18 0321  WBC 9.5 9.3 8.8 9.0 9.5  NEUTROABS  --   --  4.3 4.6 4.8  HGB 12.0 11.5* 11.5* 12.4 11.4*  HCT 36.7 35.7* 35.0* 37.7 36.0  MCV 93.9 93.5 93.6 93.1 94.0  PLT 286 287 289 282 998   Basic Metabolic Panel: Recent Labs  Lab  12/08/18 1641 12/09/18 0459 12/10/18 0245 12/11/18 0217 12/12/18 0321  NA 136 136 138 137 138  K 3.5 4.0 3.3* 3.9 3.5  CL 97* 97* 97* 99 98  CO2 26 27 32 23 29  GLUCOSE 219* 263* 216* 197* 177*  BUN _1 CREATININE 1.00 0.92 0.99 1.03* 0.94  CALCIUM 9.2 9.1 8.9 9.4 9.3  MG  --  1.7 1.6*  --   --    GFR: Estimated Creatinine Clearance: 64.1 mL/min (by C-G formula based on SCr of 0.94 mg/dL). Liver Function Tests: Recent Labs  Lab 12/11/18 0217  AST 50*  ALT 24  ALKPHOS 55  BILITOT 1.0  PROT 7.1  ALBUMIN 3.7   No results for input(s): LIPASE, AMYLASE in the last 168 hours. No results for input(s): AMMONIA in the last 168 hours. Coagulation Profile: No results for input(s): INR, PROTIME in the last 168 hours. Cardiac Enzymes: Recent Labs  Lab 12/10/18 1425  CKTOTAL 40  CKMB 1.0   BNP (last 3 results) No results for input(s): PROBNP in the last 8760 hours. HbA1C: No results for input(s): HGBA1C in the last 72 hours. CBG: Recent Labs  Lab 12/10/18 2111 12/11/18 0640 12/11/18 1131 12/11/18 1602 12/11/18  2113  GLUCAP 276* 187* 200* 270* 296*   Lipid Profile: No results for input(s): CHOL, HDL, LDLCALC, TRIG, CHOLHDL, LDLDIRECT in the last 72 hours. Thyroid Function Tests: No results for input(s): TSH, T4TOTAL, FREET4, T3FREE, THYROIDAB in the last 72 hours. Anemia Panel: No results for input(s): VITAMINB12, FOLATE, FERRITIN, TIBC, IRON, RETICCTPCT in the last 72 hours. Urine analysis:    Component Value Date/Time   COLORURINE YELLOW 04/05/2017 0134   APPEARANCEUR CLEAR 04/05/2017 0134   LABSPEC 1.021 04/05/2017 0134   PHURINE 5.0 04/05/2017 0134   GLUCOSEU 50 (A) 04/05/2017 0134   HGBUR NEGATIVE 04/05/2017 0134   BILIRUBINUR NEGATIVE 04/05/2017 0134   KETONESUR NEGATIVE 04/05/2017 0134   PROTEINUR NEGATIVE 04/05/2017 0134   UROBILINOGEN 1.0 07/30/2014 0001   NITRITE NEGATIVE 04/05/2017 0134   LEUKOCYTESUR TRACE (A) 04/05/2017 0134    Sepsis Labs: _0 (procalcitonin:4,lacticacidven:4)  ) Recent Results (from the past 240 hour(s))  SARS Coronavirus 2 [facility] order, Performed in [facility] hospital lab) Nasopharyngeal Nasopharyngeal Swab     Status: None   Collection Time: 12/08/18  6:30 PM   Specimen: Nasopharyngeal Swab  Result Value Ref Range Status   SARS Coronavirus 2 NEGATIVE NEGATIVE Final    Comment: (NOTE) If result is NEGATIVE SARS-CoV-2 target nucleic acids are NOT DETECTED. The SARS-CoV-2 RNA is generally detectable in upper and lower  respiratory specimens during the acute phase of infection. The lowest  concentration of SARS-CoV-2 viral copies this assay can detect is 250  copies / mL. A negative result does not preclude SARS-CoV-2 infection  and should not be used as the sole basis for treatment or other  patient management decisions.  A negative result may occur with  improper specimen collection / handling, submission of specimen other  than nasopharyngeal swab, presence of viral mutation(s) within the  areas targeted by this assay, and inadequate number of viral copies  (<250 copies / mL). A negative result must be combined with clinical  observations, patient history, and epidemiological information. If result is POSITIVE SARS-CoV-2 target nucleic acids are DETECTED. The SARS-CoV-2 RNA is generally detectable in upper and lower  respiratory specimens dur ing the acute phase of infection.  Positive  results are indicative of active infection with SARS-CoV-2.  Clinical  correlation with patient history and other diagnostic information is  necessary to determine patient infection status.  Positive results do  not rule out bacterial infection or co-infection with other viruses. If result is PRESUMPTIVE POSTIVE SARS-CoV-2 nucleic acids MAY BE PRESENT.   A presumptive positive result was obtained on the submitted specimen  and confirmed on repeat testing.  While 2019 novel coronavirus   (SARS-CoV-2) nucleic acids may be present in the submitted sample  additional confirmatory testing may be necessary for epidemiological  and / or clinical management purposes  to differentiate between  SARS-CoV-2 and other Sarbecovirus currently known to infect humans.  If clinically indicated additional testing with an alternate test  methodology [phone]) is advised. The SARS-CoV-2 RNA is generally  detectable in upper and lower respiratory sp ecimens during the acute  phase of infection. The expected result is Negative. Fact Sheet for Patients:  StrictlyIdeas.no Fact Sheet for Healthcare Providers: BankingDealers.co.za This test is not yet approved or cleared by the [place] FDA and has been authorized for detection and/or diagnosis of SARS-CoV-2 by FDA under an Emergency Use Authorization (EUA).  This EUA will remain in effect (meaning this test can be used) for the duration of the COVID-19 declaration under  Section 564(b)(1) of the Act, 21 U.S.C. section 360bbb-3(b)(1), unless the authorization is terminated or revoked sooner. Performed at [facility], [place] [address] [place], [place] [address]   MRSA PCR Screening     Status: None   Collection Time: 12/08/18 10:05 PM   Specimen: Nasal Mucosa; Nasopharyngeal  Result Value Ref Range Status   MRSA by PCR NEGATIVE NEGATIVE Final    Comment:        The GeneXpert MRSA Assay (FDA approved for NASAL specimens only), is one component of a comprehensive MRSA colonization surveillance program. It is not intended to diagnose MRSA infection nor to guide or monitor treatment for MRSA infections. Performed at [facility], [place] [address] [place], [place] [address]          Radiology Studies: Ct Chest High Resolution  Result Date: 12/11/2018 CLINICAL DATA:  Coughing, shortness of breath, concern for ILD EXAM: CT CHEST WITHOUT CONTRAST TECHNIQUE: Multidetector CT  imaging of the chest was performed following the standard protocol without intravenous contrast. High resolution imaging of the lungs, as well as inspiratory and expiratory imaging, was performed. COMPARISON:  CT chest, 02/18/2018 FINDINGS: Cardiovascular: Aortic atherosclerosis. Cardiomegaly status post median sternotomy and CABG with extensive 3 vessel coronary artery calcifications. No pericardial effusion. Enlargement of the main pulmonary artery up to 3.8 cm. Mediastinum/Nodes: Unchanged enlarged pretracheal and AP window lymph nodes, measuring up to 1.5 cm (series 5, image 42). Thyroid gland, trachea, and esophagus demonstrate no significant findings. Lungs/Pleura: Unchanged small, benign pulmonary nodules of the right lower lobe, measuring 5 mm or smaller (series 6, image 79). No significant change in areas of mild subpleural and parenchymal reticulation, architectural distortion, ground-glass, and tubular bronchiectasis without evidence of apical to basal gradient, for example in the lingula (series 6, image 48) and in the right lung base (series 6, image 70). There is mild lobular air trapping on expiratory phase imaging. No pleural effusion or pneumothorax. Upper Abdomen: No acute abnormality. Musculoskeletal: No chest wall mass or suspicious bone lesions identified. IMPRESSION: 1. No significant change in areas of mild subpleural and parenchymal reticulation, architectural distortion, ground-glass, and tubular bronchiectasis without evidence of apical to basal gradient, for example in the lingula (series 6, image 48) and in the right lung base (series 6, image 70). There is mild lobular air trapping on expiratory phase imaging. Overall constellation of findings are not significantly changed when compared to examination dated 02/18/2018 and remain suggestive of an alternative diagnosis pattern of pulmonary fibrosis by ATS pulmonary fibrosis criteria, leading differential consideration fibrotic NSIP.  Findings are suggestive of an alternative diagnosis (not UIP) per consensus guidelines: Diagnosis of Idiopathic Pulmonary Fibrosis: An Official ATS/ERS/JRS/ALAT Clinical Practice Guideline. [place], Iss 5, [id number], Nov 15 2016. 2.  Coronary artery disease. Aortic Atherosclerosis (ICD10-I70.0). 3. Enlargement of the main pulmonary artery, which can be seen in pulmonary hypertension. 4. Unchanged enlarged pretracheal and AP window lymph nodes, measuring up to 1.5 cm (series 5, image 42), likely reactive in the setting of interstitial lung disease. Electronically Signed   By: Eddie Candle M.D.   On: 12/11/2018 12:03        Scheduled Meds: . apixaban  5 mg Oral BID  . aspirin  81 mg Oral Daily  . diltiazem  480 mg Oral Daily  . ezetimibe  10 mg Oral Daily  . fenofibrate  160 mg Oral Daily  . furosemide  60 mg Intravenous Q12H  .  insulin aspart  0-15 Units Subcutaneous TID WC  . insulin detemir  30 Units Subcutaneous BID  . metFORMIN  500 mg Oral BID WC  . metoprolol succinate  100 mg Oral Daily  . potassium chloride  40 mEq Oral Daily  . Semaglutide  3 mg Oral q morning - 10a   Continuous Infusions:   LOS: 4 days    Time spent: 25 minutes    Edwin Dada, MD [facility] 12/12/2018, 10:14 AM     Please page through [place]:  [email or web address] Password [email or web address] If 7PM-7AM, please contact night-coverage    Estimated body mass index is 39.92 kg/m as calculated from the following:   Height as of 09/15/18: 5' 4" (1.626 m).   Weight as of this encounter: 105.5 kg. Malnutrition Type:   Malnutrition Characteristics:   Nutrition Interventions:      . apixaban  5 mg Oral BID  . aspirin  81 mg Oral Daily  . diltiazem  480 mg Oral Daily  . ezetimibe  10 mg Oral Daily  . fenofibrate  160 mg Oral Daily  . furosemide  60 mg Intravenous Q12H  . insulin aspart  0-15 Units Subcutaneous TID WC  . insulin detemir  30 Units Subcutaneous BID  .  metFORMIN  500 mg Oral BID WC  . metoprolol succinate  100 mg Oral Daily  . potassium chloride  40 mEq Oral Daily  . Semaglutide  3 mg Oral q morning - 10a     Current Meds  Medication Sig  . acetaminophen (TYLENOL) 500 MG tablet Take 500-1,000 mg by mouth every 6 (six) hours as needed for moderate pain.  [name] apixaban (ELIQUIS) 5 MG TABS tablet Take 1 tablet (5 mg total) by mouth 2 (two) times daily. Take 1st dose 06-07-14  . aspirin 81 MG tablet Take 81 mg by mouth daily.   . B Complex-Biotin-FA (B-COMPLEX PO) Take 1 capsule by mouth every morning.   [name] BIOTIN PO Take 1 tablet by mouth daily.  . Cholecalciferol (VITAMIN D3) 1000 UNITS CAPS Take 1,000 Units by mouth daily.  . Coenzyme Q10 (CO Q-10) 200 MG CAPS Take 200 mg by mouth daily.  . diphenhydramine-acetaminophen (TYLENOL PM) 25-500 MG TABS tablet Take 1 tablet by mouth at bedtime as needed (sleep).  [name] doxycycline (VIBRA-TABS) 100 MG tablet Take 1 tablet (100 mg total) by mouth 2 (two) times daily.  [name] ezetimibe (ZETIA) 10 MG tablet Take 10 mg by mouth daily.  . fenofibrate 160 MG tablet TAKE 1 TABLET DAILY  . HUMULIN 70/30 KWIKPEN (70-30) 100 UNIT/ML PEN Inject 35 Units into the muscle 2 (two) times daily at 8 am and 10 pm.  . losartan (COZAAR) 50 MG tablet TAKE 1 TABLET UPTO TWICE A DAY AS DIRECTED (Patient taking differently: Take 50 mg by mouth daily. )  . metFORMIN (GLUCOPHAGE) 500 MG tablet Take 500 mg by mouth 2 (two) times daily with a meal.   . metoprolol succinate (TOPROL-XL) 100 MG 24 hr tablet Take 1 tablet daily with or immediatelyfollowing a meal. (Patient taking differently: Take 100 mg by mouth daily. )  . Omega-3 Fatty Acids (FISH OIL PO) Take 2 capsules by mouth 2 (two) times daily.  . potassium chloride (K-DUR,KLOR-CON) 10 MEQ tablet Take 10 mEq by mouth every morning.   . Semaglutide (RYBELSUS) 3 MG TABS Take by mouth.  . [DISCONTINUED] Icosapent Ethyl (VASCEPA) 1 g CAPS Take 2 capsules (2 g total) by mouth 2 (two)  times daily.  . [  DISCONTINUED] NEOMYCIN-POLYMYXIN-HYDROCORTISONE (CORTISPORIN) 1 % SOLN otic solution Place 4 drops into both ears 2 (two) times daily as needed. For pain and itching   acetaminophen

## 2018-12-12 NOTE — Progress Notes (Signed)
   Remote review note   - serology negative   - HRCT - ILD + .Nochanged x 1 year. 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 Given bird exposure and some air traopping on HRCT -> Chronic HP is possible. ALso, NSIP.   - severity is currently probably mild given fact she did not desaturate with walking   - glad she has not progressed on CT x 1 year  - glad off amio  Plan  - get spirometry and dlco - needs tissue given broad ddx for the tupe of ILD - 2 optinos - less invasive but less sensitive options of Bronch with BAL for cell count and TBBx for ENVISIA CLASSIFIER versus surgical lung bx direct  - d/w Dr Valeta Harms primary pulmonary": He will round on patient 12/13/18 - most likely do bronch as  First step. Either he will do it as inpatient next 48h or either him/me will do it as poutpatient      SIGNATURE    Dr. Brand Males, M.D., F.C.C.P,  Pulmonary and Critical Care Medicine Staff Physician, [place] Director - Interstitial Lung Disease  Program  [facility] [place], [place], [address]  Pager: [phone], If no answer or between  15:00h - 7:00h: call [phone] Telephone: [phone]  4:55 PM 12/12/2018

## 2018-12-13 ENCOUNTER — Inpatient Hospital Stay (HOSPITAL_COMMUNITY): Payer: Medicare Other

## 2018-12-13 DIAGNOSIS — E1165 Type 2 diabetes mellitus with hyperglycemia: Secondary | ICD-10-CM

## 2018-12-13 DIAGNOSIS — I480 Paroxysmal atrial fibrillation: Principal | ICD-10-CM

## 2018-12-13 DIAGNOSIS — I5033 Acute on chronic diastolic (congestive) heart failure: Secondary | ICD-10-CM

## 2018-12-13 DIAGNOSIS — I110 Hypertensive heart disease with heart failure: Secondary | ICD-10-CM

## 2018-12-13 LAB — SJOGRENS SYNDROME-B EXTRACTABLE NUCLEAR ANTIBODY: SSB (La) (ENA) Antibody, IgG: <0.2 AI (ref 0.0–0.9)

## 2018-12-13 LAB — BASIC METABOLIC PANEL
Anion gap: 8 (ref 5–15)
BUN: 17 mg/dL (ref 8–23)
CO2: 29 mmol/L (ref 22–32)
Calcium: 9.5 mg/dL (ref 8.9–10.3)
Chloride: 99 mmol/L (ref 98–111)
Creatinine, Ser: 0.92 mg/dL (ref 0.44–1.00)
GFR calc Af Amer: >60 mL/min (ref >60)
GFR calc non Af Amer: >60 mL/min (ref >60)
Glucose, Bld: 197 mg/dL — ABNORMAL HIGH (ref 70–99)
Potassium: 3.7 mmol/L (ref 3.5–5.1)
Sodium: 136 mmol/L (ref 135–145)

## 2018-12-13 LAB — PULMONARY FUNCTION TEST
DL/VA % pred: 152 %
DL/VA: 6.31 ml/min/mmHg/L
DLCO cor % pred: 64 %
DLCO cor: 12.17 ml/min/mmHg
DLCO unc % pred: 59 %
DLCO unc: 11.35 ml/min/mmHg
FEF 25-75 Pre: 2.18 L/sec
FEF2575-%Pred-Pre: 122 %
FEV1-%Pred-Pre: 52 %
FEV1-Pre: 1.12 L
FEV1FVC-%Pred-Pre: 121 %
FEV6-%Pred-Pre: 44 %
FEV6-Pre: 1.23 L
FEV6FVC-%Pred-Pre: 104 %
FVC-%Pred-Pre: 42 %
FVC-Pre: 1.23 L
Pre FEV1/FVC ratio: 92 %
Pre FEV6/FVC Ratio: 100 %

## 2018-12-13 LAB — GLUCOSE, CAPILLARY
Glucose-Capillary: 217 mg/dL — ABNORMAL HIGH (ref 70–99)
Glucose-Capillary: 313 mg/dL — ABNORMAL HIGH (ref 70–99)

## 2018-12-13 LAB — GLOMERULAR BASEMENT MEMBRANE ANTIBODIES: GBM Ab: 3 units (ref 0–20)

## 2018-12-13 MED ORDER — DILTIAZEM HCL ER COATED BEADS 240 MG PO CP24: 480.0000 mg | ORAL_CAPSULE | Freq: Every day | ORAL | 2 refills | Status: DC

## 2018-12-13 MED ORDER — FUROSEMIDE 40 MG PO TABS: 40.0000 mg | ORAL_TABLET | Freq: Two times a day (BID) | ORAL | 1 refills | Status: DC

## 2018-12-13 NOTE — Progress Notes (Signed)
Inpatient Diabetes Program Recommendations  AACE/ADA: New Consensus Statement on Inpatient Glycemic Control (2015)  Target Ranges:  Prepandial:   less than 140 mg/dL      Peak postprandial:   less than 180 mg/dL (1-2 hours)      Critically ill patients:  140 - 180 mg/dL   Lab Results  Component Value Date   GLUCAP 217 (H) 12/13/2018   HGBA1C 10.9 (H) 12/09/2018    Review of Glycemic Control Results for [name], [name] (MRN [id number]) as of 12/13/2018 09:58  Ref. Range 12/12/2018 06:20 12/12/2018 11:36 12/12/2018 16:10 12/12/2018 22:09 12/13/2018 06:25  Glucose-Capillary Latest Ref Range: 70 - 99 mg/dL 141 (H) 173 (H) 261 (H) 213 (H) 217 (H)   Diabetes history: DM2 Outpatient Diabetes medications:70/30 35 units BID, Metformin 500 mg BID, Semaglutide 3 mg daily Current orders for Inpatient glycemic control:Levemir 30 units BID, Novolog 0-15 units TID with meals, Metformin 500 mg BID, Semaglutide 3 mg daily  Inpatient Diabetes Program Recommendations: Please consider: -Novolog 0-5 units QHS for bedtime correction. -Novolog4units TID with meals for meal coverage if patient eats at least 50% of meals.  Thank you, Nani Gasser. Dior Stepter, RN, MSN, CDE  Diabetes Coordinator Inpatient Glycemic Control Team Team Pager [phone] (8am-5pm) 12/13/2018 9:59 AM

## 2018-12-13 NOTE — Discharge Summary (Signed)
Physician Discharge Summary  [name] MYU:[id number] DOB: 02-20-1946 DOA: 12/08/2018  PCP: Wannetta Sender, FNP  Admit date: 12/08/2018 Discharge date: 12/13/2018  Admitted From: Home  Disposition:  Home   Recommendations for Outpatient Follow-up:  1. Follow up with PCP in 1-2 weeks 2. Follow up with Pulmonology on Oct 13 3. Please obtain BMP in one to two weeks 4. Please refer for outpatient Sleep study     Home Health: None  Equipment/Devices: None  Discharge Condition: Fair  CODE STATUS: FULL Diet recommendation: Diabetic, low sodium  Brief/Interim Summary: Mrs. Stanzione is a 73 y.o. F with hx cAF on Eliquis, s/p MAZE procedure, pacer in place, CAD s/p CABG 2005, HTN, hx TIA, dCHF, ILD, DM and OSA not on CPAP who presented with 2 weeks progressive dyspnea.  In the ER, HR was 140s and she was started on diltiazem infusion and IV Lasix.  CXR unremarkable.          PRINCIPAL HOSPITAL DIAGNOSIS: Acute on chronic diastolic CHF due to rapid Afib    Discharge Diagnoses:   Atrial fibrillation with RVR Eliquis continued.  HR controlled with diltiazem drip, transitioned to oral Diltiazem.  Discharged on metoprolol and new diltiazem 480 daily.    Acute on chronic diastolic CHF Cor pulmonale Diuresed 2L on admission.  Cr and K stable, symptoms and swelling resolved.    Discharged back on Lasix 40 mg BID, which she had been taking before, she reports.    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  Pulmonology consulted this admission, they recommend broadening differential, checking autoimmune serologies, ANCAs, fungal Abs screening for hypersensitivity, and HRCT supine and prone inspiratory and expiratory images, followed by outpatient follow-up.  They recommend holding steroids until diagnostic work-up is been completed.  HRCT unchanged from Dec 2019.   Diabetes  Coronary disease and cerebrovascular  disease secondary prevention Hypertension  Sleep apnea not on CPAP  Hypomagnesemia Supplemented           Discharge Instructions  Discharge Instructions    Diet - low sodium heart healthy   Complete by: As directed    Discharge instructions   Complete by: As directed    From Dr. Loleta Books: You were admitted with trouble breathing from fluid overload. Your heart rate was also 140 beats per minute  Your heart rate was slowed by starting a medicine called diltiazem.  You should continue this new medicine: Take diltiazem 480 mg (two tabs) once dialy in the morning   Also, remember to take your Lasix as prescribed, twice daily   Resume your other home medicines  Call your primary care doctor to have an appointment and lab work in 1-2 weeks  Follow up with the lung specialist, Dr. Chase Caller on Oct 13 as scheduled   Increase activity slowly   Complete by: As directed      Allergies as of 12/13/2018   No Known Allergies     Medication List    STOP taking these medications   doxycycline 100 MG tablet Commonly known as: VIBRA-TABS     TAKE these medications   acetaminophen 500 MG tablet Commonly known as: TYLENOL Take 500-1,000 mg by mouth every 6 (six) hours as needed for moderate pain.   apixaban 5 MG Tabs tablet Commonly known as: Eliquis Take 1 tablet (5 mg total) by mouth 2 (two) times daily. Take 1st dose 06-07-14   aspirin 81 MG tablet Take 81 mg by mouth daily.  B-COMPLEX PO Take 1 capsule by mouth every morning.   BIOTIN PO Take 1 tablet by mouth daily.   Co Q-10 200 MG Caps Take 200 mg by mouth daily.   diltiazem 240 MG 24 hr capsule Commonly known as: CARDIZEM CD Take 2 capsules (480 mg total) by mouth daily.   diphenhydramine-acetaminophen 25-500 MG Tabs tablet Commonly known as: TYLENOL PM Take 1 tablet by mouth at bedtime as needed (sleep).   ezetimibe 10 MG tablet Commonly known as: ZETIA Take 10 mg by mouth daily.   fenofibrate  160 MG tablet TAKE 1 TABLET DAILY   FISH OIL PO Take 2 capsules by mouth 2 (two) times daily.   furosemide 40 MG tablet Commonly known as: LASIX Take 1 tablet (40 mg total) by mouth 2 (two) times daily.   HumuLIN 70/30 KwikPen (70-30) 100 UNIT/ML PEN Generic drug: Insulin Isophane & Regular Human Inject 35 Units into the muscle 2 (two) times daily at 8 am and 10 pm.   losartan 50 MG tablet Commonly known as: COZAAR TAKE 1 TABLET UPTO TWICE A DAY AS DIRECTED What changed: See the new instructions.   metFORMIN 500 MG tablet Commonly known as: GLUCOPHAGE Take 500 mg by mouth 2 (two) times daily with a meal.   metoprolol succinate 100 MG 24 hr tablet Commonly known as: TOPROL-XL Take 1 tablet daily with or immediatelyfollowing a meal. What changed: See the new instructions.   potassium chloride 10 MEQ tablet Commonly known as: K-DUR Take 10 mEq by mouth every morning.   Rybelsus 3 MG Tabs Generic drug: Semaglutide Take by mouth.   Vitamin D3 25 MCG (1000 UT) Caps Take 1,000 Units by mouth daily.      Follow-up Information    Brand Males, MD Follow up on 12/28/2018.   Specialty: Pulmonary Disease Why: Appointment is at 3:15 pm. Contact information: [place] [address] [phone]        Varnado, Tijeras, FNP Follow up.   Specialty: Family Medicine Why: Call your primary care doctor for a follow up in 1-2 weeks Contact information: [place] [facility] [address] [place] [address] [phone]          No Known Allergies  Consultations:  Pulmonology   Procedures/Studies: Dg Chest 2 View  Result Date: 12/08/2018 CLINICAL DATA:  Shortness of breath and cough for 3 days EXAM: CHEST - 2 VIEW COMPARISON:  Chest x-ray 09/04/2015 and chest CT 02/18/2018. FINDINGS: The heart is enlarged. Stable surgical changes from bypass surgery. There is tortuosity and calcification of the  thoracic aorta. Chronic interstitial lung disease as demonstrated on prior chest films and CT scan. Findings may be slightly progressive. No focal infiltrates or effusions. No worrisome pulmonary lesions. The pacer wires are stable. The bony structures are intact. IMPRESSION: 1. Stable mild cardiac enlargement. 2. Persistent interstitial lung disease with possible progression. 3. No infiltrates or effusions. Electronically Signed   By: Marijo Sanes M.D.   On: 12/08/2018 17:26   Ct Chest High Resolution  Result Date: 12/11/2018 CLINICAL DATA:  Coughing, shortness of breath, concern for ILD EXAM: CT CHEST WITHOUT CONTRAST TECHNIQUE: Multidetector CT imaging of the chest was performed following the standard protocol without intravenous contrast. High resolution imaging of the lungs, as well as inspiratory and expiratory imaging, was performed. COMPARISON:  CT chest, 02/18/2018 FINDINGS: Cardiovascular: Aortic atherosclerosis. Cardiomegaly status post median sternotomy and CABG with extensive 3 vessel  coronary artery calcifications. No pericardial effusion. Enlargement of the main pulmonary artery up to 3.8 cm. Mediastinum/Nodes: Unchanged enlarged pretracheal and AP window lymph nodes, measuring up to 1.5 cm (series 5, image 42). Thyroid gland, trachea, and esophagus demonstrate no significant findings. Lungs/Pleura: Unchanged small, benign pulmonary nodules of the right lower lobe, measuring 5 mm or smaller (series 6, image 79). No significant change in areas of mild subpleural and parenchymal reticulation, architectural distortion, ground-glass, and tubular bronchiectasis without evidence of apical to basal gradient, for example in the lingula (series 6, image 48) and in the right lung base (series 6, image 70). There is mild lobular air trapping on expiratory phase imaging. No pleural effusion or pneumothorax. Upper Abdomen: No acute abnormality. Musculoskeletal: No chest wall mass or suspicious bone lesions  identified. IMPRESSION: 1. No significant change in areas of mild subpleural and parenchymal reticulation, architectural distortion, ground-glass, and tubular bronchiectasis without evidence of apical to basal gradient, for example in the lingula (series 6, image 48) and in the right lung base (series 6, image 70). There is mild lobular air trapping on expiratory phase imaging. Overall constellation of findings are not significantly changed when compared to examination dated 02/18/2018 and remain suggestive of an alternative diagnosis pattern of pulmonary fibrosis by ATS pulmonary fibrosis criteria, leading differential consideration fibrotic NSIP. Findings are suggestive of an alternative diagnosis (not UIP) per consensus guidelines: Diagnosis of Idiopathic Pulmonary Fibrosis: An Official ATS/ERS/JRS/ALAT Clinical Practice Guideline. [place], Iss 5, [phone], Nov 15 2016. 2.  Coronary artery disease. Aortic Atherosclerosis (ICD10-I70.0). 3. Enlargement of the main pulmonary artery, which can be seen in pulmonary hypertension. 4. Unchanged enlarged pretracheal and AP window lymph nodes, measuring up to 1.5 cm (series 5, image 42), likely reactive in the setting of interstitial lung disease. Electronically Signed   By: Eddie Candle M.D.   On: 12/11/2018 12:03  Echocardiogram 9/24 IMPRESSIONS    1. Left ventricular ejection fraction, by visual estimation, is 55%. The left ventricle has normal function. Normal left ventricular size. There is mildly increased left ventricular hypertrophy.  2. Definity contrast agent was given IV to delineate the left ventricular endocardial borders.  3. Left ventricular diastolic Doppler parameters are indeterminate pattern of LV diastolic filling.  4. The RV was poorly visualized. Global right ventricle has mildly reduced systolic function.The right ventricular size is mildly enlarged. No increase in right ventricular wall thickness. D-shaped  interventricular septum suggestive of RV  pressure/volume overload.  5. Moderate mitral annular calcification.  6. The mitral valve is degenerative. No evidence of mitral valve regurgitation. No evidence of mitral stenosis.  7. The tricuspid valve is normal in structure. Tricuspid valve regurgitation is trivial.  8. The aortic valve is tricuspid Aortic valve regurgitation is trivial by color flow Doppler. Mild to moderate aortic valve sclerosis/calcification without any evidence of aortic stenosis.  9. There is mild dilatation of the ascending aorta measuring 37 mm. 10. A pacer wire is visualized. 11. Left atrial size was mildly dilated. 12. The inferior vena cava is dilated in size with <50% respiratory variability, suggesting right atrial pressure of 15 mmHg. 13. The tricuspid regurgitant velocity is 2.73 m/s, and with an assumed right atrial pressure of 15 mmHg, the estimated right ventricular systolic pressure is moderately elevated at 44.8 mmHg. 14. Right atrial size was not well visualized.   PFTs 9/28 FVC-Pre L 1.23 P   FVC-%Pred-Pre % 42 P   FEV1-Pre L 1.12 P  FEV1-%Pred-Pre % 52 P   FEV6-Pre L 1.23 P   FEV6-%Pred-Pre % 44 P   Pre FEV1/FVC ratio % 92 P   FEV1FVC-%Pred-Pre % 121 P   Pre FEV6/FVC Ratio % 100 P   FEV6FVC-%Pred-Pre % 104 P   FEF 25-75 Pre L/sec 2.18 P   FEF2575-%Pred-Pre % 122 P   DLCO unc ml/min/mmHg 11.35 P   DLCO unc % pred % 59 P   DLCO cor ml/min/mmHg 12.17 P   DLCO cor % pred % 64 P   DL/VA ml/min/mmHg/L 6.31 P   DL/VA % pred % 152 P    Although the FEV1 and FVC are reduced, the FEV1/FVC ratio is increased. The slow vital capacity is reduced. The diffusing capacity is high for the measured volume. Conclusions: The diffusing capacity is high for the measured alveolar volume suggesting a chest wall limitation or reduced force generation as possible explanations for the restriction.  Minimal obstructive airways disease Severe restriction Increased  diffusion          Subjective: Feeling well.  No orthoponea, palpitations, dyspnea on exertion.  O2 sats stable on ambulation.  No confusion, fever, sputum.  Discharge Exam: Vitals:   12/13/18 0419 12/13/18 0731  BP: (!) 150/68 (!) 152/61  Pulse: (!) 59 62  Resp: 18 20  Temp: 97.7 F (36.5 C) 98.2 F (36.8 C)  SpO2: 99% 96%   Vitals:   12/12/18 2345 12/13/18 0419 12/13/18 0500 12/13/18 0731  BP: (!) 155/73 (!) 150/68  (!) 152/61  Pulse: (!) 59 (!) 59  62  Resp: _0 Temp: (!) 97 F (36.1 C) 97.7 F (36.5 C)  98.2 F (36.8 C)  TempSrc: Axillary Oral  Oral  SpO2: 99% 99%  96%  Weight:   105.7 kg   Height:        General: Pt is alert, awake, not in acute distress Cardiovascular: RRR, nl S1-S2, no murmurs appreciated.   No LE edema.   Respiratory: Normal respiratory rate and rhythm.  CTAB without rales or wheezes. Abdominal: Abdomen soft and non-tender.  No distension or HSM.   Neuro/Psych: Strength symmetric in upper and lower extremities.  Judgment and insight appear normal.   The results of significant diagnostics from this hospitalization (including imaging, microbiology, ancillary and laboratory) are listed below for reference.     Microbiology: Recent Results (from the past 240 hour(s))  SARS Coronavirus 2 [facility] order, Performed in [facility] hospital lab) Nasopharyngeal Nasopharyngeal Swab     Status: None   Collection Time: 12/08/18  6:30 PM   Specimen: Nasopharyngeal Swab  Result Value Ref Range Status   SARS Coronavirus 2 NEGATIVE NEGATIVE Final    Comment: (NOTE) If result is NEGATIVE SARS-CoV-2 target nucleic acids are NOT DETECTED. The SARS-CoV-2 RNA is generally detectable in upper and lower  respiratory specimens during the acute phase of infection. The lowest  concentration of SARS-CoV-2 viral copies this assay can detect is 250  copies / mL. A negative result does not preclude SARS-CoV-2 infection  and should not be used as the  sole basis for treatment or other  patient management decisions.  A negative result may occur with  improper specimen collection / handling, submission of specimen other  than nasopharyngeal swab, presence of viral mutation(s) within the  areas targeted by this assay, and inadequate number of viral copies  (<250 copies / mL). A negative result must be combined with clinical  observations, patient history, and epidemiological information.  If result is POSITIVE SARS-CoV-2 target nucleic acids are DETECTED. The SARS-CoV-2 RNA is generally detectable in upper and lower  respiratory specimens dur ing the acute phase of infection.  Positive  results are indicative of active infection with SARS-CoV-2.  Clinical  correlation with patient history and other diagnostic information is  necessary to determine patient infection status.  Positive results do  not rule out bacterial infection or co-infection with other viruses. If result is PRESUMPTIVE POSTIVE SARS-CoV-2 nucleic acids MAY BE PRESENT.   A presumptive positive result was obtained on the submitted specimen  and confirmed on repeat testing.  While 2019 novel coronavirus  (SARS-CoV-2) nucleic acids may be present in the submitted sample  additional confirmatory testing may be necessary for epidemiological  and / or clinical management purposes  to differentiate between  SARS-CoV-2 and other Sarbecovirus currently known to infect humans.  If clinically indicated additional testing with an alternate test  methodology [phone]) is advised. The SARS-CoV-2 RNA is generally  detectable in upper and lower respiratory sp ecimens during the acute  phase of infection. The expected result is Negative. Fact Sheet for Patients:  StrictlyIdeas.no Fact Sheet for Healthcare Providers: BankingDealers.co.za This test is not yet approved or cleared by the [place] FDA and has been authorized for detection  and/or diagnosis of SARS-CoV-2 by FDA under an Emergency Use Authorization (EUA).  This EUA will remain in effect (meaning this test can be used) for the duration of the COVID-19 declaration under Section 564(b)(1) of the Act, 21 U.S.C. section 360bbb-3(b)(1), unless the authorization is terminated or revoked sooner. Performed at [facility], [place] [address], [place], [place] [address]   MRSA PCR Screening     Status: None   Collection Time: 12/08/18 10:05 PM   Specimen: Nasal Mucosa; Nasopharyngeal  Result Value Ref Range Status   MRSA by PCR NEGATIVE NEGATIVE Final    Comment:        The GeneXpert MRSA Assay (FDA approved for NASAL specimens only), is one component of a comprehensive MRSA colonization surveillance program. It is not intended to diagnose MRSA infection nor to guide or monitor treatment for MRSA infections. Performed at [facility], [place] [address], [place], [place] [address]      Labs: BNP (last 3 results) Recent Labs    12/08/18 1653  BNP 007.6*   Basic Metabolic Panel: Recent Labs  Lab 12/09/18 0459 12/10/18 0245 12/11/18 0217 12/12/18 0321 12/13/18 0411  NA 136 138 137 138 136  K 4.0 3.3* 3.9 3.5 3.7  CL 97* 97* 99 98 99  CO2 27 32 _0 GLUCOSE 263* 216* 197* 177* 197*  BUN _1 CREATININE 0.92 0.99 1.03* 0.94 0.92  CALCIUM 9.1 8.9 9.4 9.3 9.5  MG 1.7 1.6*  --   --   --    Liver Function Tests: Recent Labs  Lab 12/11/18 0217  AST 50*  ALT 24  ALKPHOS 55  BILITOT 1.0  PROT 7.1  ALBUMIN 3.7   No results for input(s): LIPASE, AMYLASE in the last 168 hours. No results for input(s): AMMONIA in the last 168 hours. CBC: Recent Labs  Lab 12/08/18 1641 12/09/18 0459 12/10/18 0245 12/11/18 0217 12/12/18 0321  WBC 9.5 9.3 8.8 9.0 9.5  NEUTROABS  --   --  4.3 4.6 4.8  HGB 12.0 11.5* 11.5* 12.4 11.4*  HCT 36.7 35.7* 35.0* 37.7 36.0  MCV 93.9 93.5 93.6 93.1 94.0  PLT 286 287 289 282 285   Cardiac  Enzymes: Recent Labs  Lab 12/10/18 1425  CKTOTAL 40  CKMB 1.0   BNP: Invalid input(s): POCBNP CBG: Recent Labs  Lab 12/12/18 0620 12/12/18 1136 12/12/18 1610 12/12/18 2209 12/13/18 0625  GLUCAP 141* 173* 261* 213* 217*   D-Dimer No results for input(s): DDIMER in the last 72 hours. Hgb A1c No results for input(s): HGBA1C in the last 72 hours. Lipid Profile No results for input(s): CHOL, HDL, LDLCALC, TRIG, CHOLHDL, LDLDIRECT in the last 72 hours. Thyroid function studies No results for input(s): TSH, T4TOTAL, T3FREE, THYROIDAB in the last 72 hours.  Invalid input(s): FREET3 Anemia work up No results for input(s): VITAMINB12, FOLATE, FERRITIN, TIBC, IRON, RETICCTPCT in the last 72 hours. Urinalysis    Component Value Date/Time   COLORURINE YELLOW 04/05/2017 0134   APPEARANCEUR CLEAR 04/05/2017 0134   LABSPEC 1.021 04/05/2017 0134   PHURINE 5.0 04/05/2017 0134   GLUCOSEU 50 (A) 04/05/2017 0134   HGBUR NEGATIVE 04/05/2017 0134   BILIRUBINUR NEGATIVE 04/05/2017 0134   KETONESUR NEGATIVE 04/05/2017 0134   PROTEINUR NEGATIVE 04/05/2017 0134   UROBILINOGEN 1.0 07/30/2014 0001   NITRITE NEGATIVE 04/05/2017 0134   LEUKOCYTESUR TRACE (A) 04/05/2017 0134   Sepsis Labs Invalid input(s): PROCALCITONIN,  WBC,  LACTICIDVEN Microbiology Recent Results (from the past 240 hour(s))  SARS Coronavirus 2 [facility] order, Performed in [facility] hospital lab) Nasopharyngeal Nasopharyngeal Swab     Status: None   Collection Time: 12/08/18  6:30 PM   Specimen: Nasopharyngeal Swab  Result Value Ref Range Status   SARS Coronavirus 2 NEGATIVE NEGATIVE Final    Comment: (NOTE) If result is NEGATIVE SARS-CoV-2 target nucleic acids are NOT DETECTED. The SARS-CoV-2 RNA is generally detectable in upper and lower  respiratory specimens during the acute phase of infection. The lowest  concentration of SARS-CoV-2 viral copies this assay can detect is 250  copies / mL. A negative result  does not preclude SARS-CoV-2 infection  and should not be used as the sole basis for treatment or other  patient management decisions.  A negative result may occur with  improper specimen collection / handling, submission of specimen other  than nasopharyngeal swab, presence of viral mutation(s) within the  areas targeted by this assay, and inadequate number of viral copies  (<250 copies / mL). A negative result must be combined with clinical  observations, patient history, and epidemiological information. If result is POSITIVE SARS-CoV-2 target nucleic acids are DETECTED. The SARS-CoV-2 RNA is generally detectable in upper and lower  respiratory specimens dur ing the acute phase of infection.  Positive  results are indicative of active infection with SARS-CoV-2.  Clinical  correlation with patient history and other diagnostic information is  necessary to determine patient infection status.  Positive results do  not rule out bacterial infection or co-infection with other viruses. If result is PRESUMPTIVE POSTIVE SARS-CoV-2 nucleic acids MAY BE PRESENT.   A presumptive positive result was obtained on the submitted specimen  and confirmed on repeat testing.  While 2019 novel coronavirus  (SARS-CoV-2) nucleic acids may be present in the submitted sample  additional confirmatory testing may be necessary for epidemiological  and / or clinical management purposes  to differentiate between  SARS-CoV-2 and other Sarbecovirus currently known to infect humans.  If clinically indicated additional testing with an alternate test  methodology [phone]) is advised. The SARS-CoV-2 RNA is generally  detectable in upper and lower respiratory sp ecimens during the acute  phase of infection. The expected result is Negative. Fact Sheet for Patients:  StrictlyIdeas.no Fact Sheet for Healthcare Providers: BankingDealers.co.za This test is not yet approved or  cleared by the [place] FDA and has been authorized for detection and/or diagnosis of SARS-CoV-2 by FDA under an Emergency Use Authorization (EUA).  This EUA will remain in effect (meaning this test can be used) for the duration of the COVID-19 declaration under Section 564(b)(1) of the Act, 21 U.S.C. section 360bbb-3(b)(1), unless the authorization is terminated or revoked sooner. Performed at [facility], [place] [address] [place], [place] [address]   MRSA PCR Screening     Status: None   Collection Time: 12/08/18 10:05 PM   Specimen: Nasal Mucosa; Nasopharyngeal  Result Value Ref Range Status   MRSA by PCR NEGATIVE NEGATIVE Final    Comment:        The GeneXpert MRSA Assay (FDA approved for NASAL specimens only), is one component of a comprehensive MRSA colonization surveillance program. It is not intended to diagnose MRSA infection nor to guide or monitor treatment for MRSA infections. Performed at [facility], [place] [address] [place], [place] [address]      Time coordinating discharge: 35 minutes       SIGNED:   Edwin Dada, MD  [facility] 12/13/2018, 10:19 AM

## 2018-12-13 NOTE — TOC Transition Note (Signed)
Transition of Care [facility]) - CM/SW Discharge Note   Patient Details  Name: [name] MRN: [id number] Date of Birth: [birth year]  [facility] CM/SW Contact:  Alberteen Sam, [place] Phone Number: [phone] 12/13/2018, 1:13 PM   Clinical Narrative:     Patient is set up with rollator through [employer] , equipment to be delivered to room. [employer] reports being out of rollators however new shipment has arrived and anticipate delivery to room within the next hour.   Final next level of care: Home/Self Care Barriers to Discharge: No Barriers Identified   Patient Goals and CMS Choice        Discharge Placement                       Discharge Plan and Services                DME Arranged: Walker rolling with seat DME Agency: [employer] Date DME Agency Contacted: 12/13/18 Time DME Agency Contacted: 1000 Representative spoke with at DME Agency: [place] ([place]) Interventions     Readmission Risk Interventions No flowsheet data found.

## 2018-12-13 NOTE — Progress Notes (Signed)
Patient discharged per written orders. Patient's daughter at the bedside for teaching/education. Prescriptions have been sent to patient's pharmacy. Discharge instructions included diet order, daily weights, when to call the doctor. Discussed follow up appointments, medications/prescriptions. Time allowed for questions/concerns. Patient and her daughter stated they had no further questions. Patient waiting for a rolling walker to be delivered to her room.  Roselyn Reef Izumi Mixon,RN

## 2018-12-13 NOTE — Progress Notes (Signed)
Physical Therapy Treatment Patient Details Name: [name] MRN: [id number] DOB: 31-Oct-1945 Today's Date: 12/13/2018    History of Present Illness Present [facility] ED 9/23 2020 DOE orthopnea nausea LE edema-CBG~300 range-A. fib RVR in ED-Rx bolus diltiazem also AECHF systolic-Lasix.  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 Bess Harvest 08/2014    PT Comments    Pt admitted with above diagnosis. Pt was able to ambulate with rollator with good safety.  Pt wants rollator at home and daughter will assist pt at home prn. Pt going home today.  Sats >94% on RA with activity.   Pt currently with functional limitations due to the deficits listed below (see PT Problem List). Pt will benefit from skilled PT to increase their independence and safety with mobility to allow discharge to the venue listed below.     Follow Up Recommendations  No PT follow up;Supervision/Assistance - 24 hour     Equipment Recommendations  Other (comment)(rollator)    Recommendations for Other Services       Precautions / Restrictions Precautions Precautions: Fall Restrictions Weight Bearing Restrictions: No    Mobility  Bed Mobility Overal bed mobility: Independent                Transfers Overall transfer level: Independent                  Ambulation/Gait Ambulation/Gait assistance: Min guard Gait Distance (Feet): 250 Feet Assistive device: 4-wheeled walker Gait Pattern/deviations: Step-through pattern;Decreased stride length   Gait velocity interpretation: 1.31 - 2.62 ft/sec, indicative of limited community ambulator General Gait Details:  Pt ambulating well with rollator.  No LOB.     Stairs             Wheelchair Mobility    Modified Rankin (Stroke Patients Only)       Balance                                            Cognition  Arousal/Alertness: Awake/alert Behavior During Therapy: WFL for tasks assessed/performed Overall Cognitive Status: Within Functional Limits for tasks assessed                                        Exercises      General Comments General comments (skin integrity, edema, etc.): VSS with sats on RA with activity 94% and >.        Pertinent Vitals/Pain Pain Assessment: No/denies pain    Home Living                      Prior Function            PT Goals (current goals can now be found in the care plan section) Acute Rehab PT Goals Patient Stated Goal: to go home Progress towards PT goals: Progressing toward goals    Frequency    Min 3X/week      PT Plan Current plan remains appropriate    Co-evaluation              AM-PAC PT "6 Clicks" Mobility   Outcome Measure  Help needed turning from your back  to your side while in a flat bed without using bedrails?: None Help needed moving from lying on your back to sitting on the side of a flat bed without using bedrails?: None Help needed moving to and from a bed to a chair (including a wheelchair)?: None Help needed standing up from a chair using your arms (e.g., wheelchair or bedside chair)?: None Help needed to walk in hospital room?: A Little Help needed climbing 3-5 steps with a railing? : A Little 6 Click Score: 22    End of Session Equipment Utilized During Treatment: Gait belt Activity Tolerance: Patient tolerated treatment well Patient left: in bed;with call bell/phone within reach(sitting on EOB) Nurse Communication: Mobility status PT Visit Diagnosis: Muscle weakness (generalized) (M62.81)     Time: 1026-1040 PT Time Calculation (min) (ACUTE ONLY): 14 min  Charges:  $Gait Training: 8-22 mins                     [place] Pager:  [phone]  Office:  [phone]     Denice Paradise 12/13/2018, 11:15 AM

## 2018-12-13 NOTE — Progress Notes (Signed)
Notified by [place] that the rollators they thought were going to be delivered to the hospital were not. Per [place] the rollator will be delivered to the patient's home in the next 2-3 business days.  Patient wheeled to the main entrance. Patient's daughter was with [place] and will transport patient.  [name] Disa Riedlinger,RN

## 2018-12-13 NOTE — Care Management Important Message (Signed)
Important Message  Patient Details  Name: [name] MRN: [id number] Date of Birth: 1945/10/17   Medicare Important Message Given:  Yes     Memory Argue 12/13/2018, 1:56 PM

## 2018-12-14 LAB — CYCLIC CITRUL PEPTIDE ANTIBODY, IGG/IGA: CCP Antibodies IgG/IgA: 12 units (ref 0–19)

## 2018-12-17 LAB — HYPERSENSITIVITY PNEUMONITIS
A. Pullulans Abs: NEGATIVE
A.Fumigatus #1 Abs: NEGATIVE
Micropolyspora faeni, IgG: NEGATIVE
Pigeon Serum Abs: NEGATIVE
Thermoact. Saccharii: NEGATIVE
Thermoactinomyces vulgaris, IgG: NEGATIVE

## 2018-12-27 ENCOUNTER — Encounter: Payer: Medicare Other | Admitting: *Deleted

## 2018-12-28 ENCOUNTER — Ambulatory Visit: Payer: Medicare Other | Admitting: Internal Medicine

## 2018-12-28 ENCOUNTER — Other Ambulatory Visit: Payer: Self-pay

## 2018-12-28 ENCOUNTER — Encounter: Payer: Self-pay | Admitting: Internal Medicine

## 2018-12-28 VITALS — BP 144/70 | HR 83 | Temp 98.3°F | Ht 64.0 in | Wt 228.0 lb

## 2018-12-28 DIAGNOSIS — G4733 Obstructive sleep apnea (adult) (pediatric): Secondary | ICD-10-CM | POA: Diagnosis not present

## 2018-12-28 DIAGNOSIS — Z9229 Personal history of other drug therapy: Secondary | ICD-10-CM | POA: Diagnosis not present

## 2018-12-28 DIAGNOSIS — Z7729 Contact with and (suspected ) exposure to other hazardous substances: Secondary | ICD-10-CM | POA: Diagnosis not present

## 2018-12-28 DIAGNOSIS — J849 Interstitial pulmonary disease, unspecified: Secondary | ICD-10-CM

## 2018-12-28 NOTE — Patient Instructions (Addendum)
ICD-10-CM   1. ILD (interstitial lung disease) ([place])  J84.9   2. Long-term exposure involving bird droppings  Z77.29   3. History of amiodarone therapy  Z92.29   4. 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  This is our thinking based on our case conference with multiple different doctors done today December 28, 2018  -I were thinking is that the history of repeated pneumonias in the past had to do with the bird exposure and also working in [employer] possibly.  After that there was a long slow quiet.  And then the scar tissue in the lung was awakened by the amiodarone  -To be certain this is the right diagnosis -some kind of a biopsy either bronchoscopy method [which is less invasive but also less specific) or surgical lung biopsy (more specific and more invasive] is recommended but I do respect your concerns over this.  Therefore we will not proceed or pursue this at this point  -It will be very helpful for her husband to have a high-resolution CT scan of the chest to help with his symptoms and also aid your diagnosis.  I have written to Dr. Ellouise Newer to order CT scan of the chest on him.  This can be done in [place], [place]  -Treatment for this condition usually involves prednisone but I am concerned about the risk with prednisone given your diabetes and weight issues.  PLAN -Therefore my current recommendation is to retest your breathing test with spirometry and DLCO in 1 month and depending on the result consider anti-fibrotic agent that does not have steroid side effects  - Also test ONO on RA sometime next few weeks  OSA (obstructive sleep apnea)  -Please asked Dr. Claiborne Billings for new mask per your  liking   Follow-up - ONO on room air next few to several weeks -1 month do spirometry and DLCO - Please see if your husband can do HRCT chest by time of next visit  -Return to see Dr. Chase Caller for 30-minute office slot in 1 month

## 2018-12-28 NOTE — Progress Notes (Signed)
December 10, 2018 inpatient pulmonary consult   73 year old never smoker with medical history significant ofCAD status post CABG, paroxysmal atrial fibrillation on Eliquis, heart failure status post pacemaker, ILD, amiodarone pulmonary toxicity, restrictive lung disease, sleep apnea(not on CPAP),insulin-dependent type 2 diabetes, hypertension who presents with worsening shortness of breath, orthopnea and nausea.  Patient reports that for  2 to 3 weeks  prior to admission she has noticed increasing dyspnea with exertion. She was having trouble just walking to her mailbox and back.  She endorses that this has  progressively  worsened to the point that she could not sleep due to orthopnea. She also noted increased nausea and increase lower extremity edema. She is seen by  Dr. Valeta Harms in the pulmonary office.She had a telephone visit with a pulmonary NP 9/23 and was advised to follow up with cardiology. She is non-compliant with her CPAP therapy. Last HRCT is also concerning for pulmonary HTN as well as IPF. Marland KitchenPt was on Amiodarone x 3 years from 2016-2019. 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  In the ED she was afebrile and mildly hypertensive 140/90. Tele indicated A fib with RVR, HR of 140. She was bolused  with  diltiazem with minimal improvement and was ultimately started on a diltiazem drip. She was also given IV 80 mg Lasix.Her blood pressure remained stable. CBC showed no anemia or leukocytosis. BMP showed elevated glucose of 219 and normal stable creatinine. Troponin of 9. BNP of 110.Chest x-ray showed stable mild cardiac enlargement and no acute processes.She was admitted by the [facility] team. PCCM have been consulted to assist with management of drug induced ILD and untreated  OSA.   Results: Shortness of breath symptoms Score >> 60 + productive cough at night No hemoptysis at present, but has coughed up blood in the past. 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 , MRN: [id number] , ADDRESS: [place] [address]   12/28/2018 -   Chief Complaint  Patient presents with   Follow-up    Pt states her breathing has improved since d/c but not back to baseline. Pt states she has an occassional cough in the morning that produces white mucus. Pt denies CP/tightness and f/c/s.    Follow-up interstitial lung disease from a hospital stay  HPI [name] 73 y.o. -returns for follow-up with her daughter.  Last seen end of September 2020 in the hospital.  Since hospitalization she reports improved symptomatology in terms of her shortness of breath.  The dyspnea score clearly shows that.  She did have pulmonary function test while in the hospital that shows a decline compared to a year ago but this could be reflective of her hospital  stay.  She tells me that when she walks she gets short of breath and this limits her.  However in the office today when we walked her she got limited by right lateral pain almost typical of meralgia paresthetica.  This been gone for a few weeks.  She will talk to her primary care physician about this.  We did notice that when we walked her 185 feet and she stopped because of her leg pain her  pulse ox went down to 89%.  She is not on oxygen either at night or in the daytime.  She uses CPAP for chronic sleep apnea managed by Dr. Ellouise Newer.  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  Spent a lot of time delving into this.  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  They did this for 15 or 20 years.  The last exposure was in the 1990s.  During the time they had this in the form it was in the outdoors.  The avuiary area was large but enclosed.  They had 200 chickens and 20 pigeons.  They will show birds.  Patient would spend at least once a week and this a very for the whole day without a mask.  During this time she also worked in a [employer] and would come home every day with different color yarn on her body.  During this time both her and her husband had multiple admissions for pneumonia.  Especially she had more and would take antibiotics and prednisone.  The husband also currently has shortness of breath but the patient feels this because of sedentary living and also obesity.  Patient states that since her last exposure to these birds she was actually doing well till she started taking amiodarone a few years ago and then this culminated in the diagnosis of ILD and she stopped her amiodarone a year ago but the dyspnea on exertion has persisted.  Today we discussed her high-resolution CT at the multidisciplinary case conference along with Dr. Salvatore Marvel our thoracic radiologist.  Significant air trapping documented.  There is no craniocaudal gradient.  There is diffuse infiltrates.  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    SYMPTOM SCALE - ILD Sept 2020 at ospital 12/28/2018   O2 use  RA =- improved after  hospitalization  Shortness of Breath  0 -> 5 scale with 5 being worst (score 6 If unable to do)  At rest 1 0  Simple tasks - showers, clothes change, eating, shaving 2 1  Household (dishes, doing bed, laundry) 4 2  Shopping 4 2  Walking level at own pace 3 2  Walking keeping up with others of same age 60 3  Walking up Stairs 5 3  Walking up Hill 5 3  Total (40 - 48) Dyspnea Score 28 16  How bad is your cough?  1  How bad is your fatigue  3        Simple office walk 185 feet x  3 laps goal with forehead probe 12/28/2018   O2 used RA  Number laps completed 1 of intendeed 3  Comments about pace Pretty slow  Resting Pulse Ox/HR 96% and 68/min  Final Pulse Ox/HR 89% and 120/min  Desaturated </= 88% no  Desaturated <= 3% points Yes, 7 points  Got Tachycardic >/= 90/min yes  Symptoms at end of test Mild dyspnea, stopped due to leg pain  Miscellaneous comments none    Results for JANEAL, ABADI (MRN [id number]) as of 12/28/2018 15:18  Ref. Range 12/28/2017 15:47 12/12/2018 16:50 - done while in  Hospital for diast chf  FVC-Pre Latest Units: L  1.23  FVC-%Pred-Pre Latest Units: % 56 42   Results for SUBRINA, VECCHIARELLI (MRN [id number]) as of 12/28/2018 15:18  Ref. Range 12/28/2017 15:47 12/12/2018 16:50  FVC-Post Latest Units: L 1.61   FVC-%Pred-Post Latest Units: % 55   Results for ALDONIA, KEEVEN (MRN [id number]) as of 12/28/2018 15:18  Ref. Range 12/28/2017 15:47 12/12/2018 16:50  DLCO unc Latest Units: ml/min/mmHg 15.97 11.35  DLCO unc % pred Latest Units: % 67 59    ROS - per HPI     has a past medical history of Asthmatic bronchitis, CHF (congestive heart failure) ([place]) (dx'd 2016), Coronary atherosclerosis of native coronary artery, Hyperlipidemia, Hypertension, Hypertensive heart disease, Kidney stones, Mixed hyperlipidemia, Paroxysmal atrial fibrillation ([place]), Pneumonia (1950), Presence of permanent cardiac pacemaker, Sleep apnea, TIA (transient ischemic attack),  Type II diabetes mellitus ([place]), and Uterine cancer ([place]).   reports that she has never smoked. She has never used smokeless tobacco.  Past Surgical History:  Procedure Laterality Date   CARDIAC CATHETERIZATION  2005   CORONARY ARTERY BYPASS GRAFT  2005   Dr Roxy Manns with MAZE   CYSTOSCOPY WITH URETEROSCOPY, STONE BASKETRY AND STENT PLACEMENT Left 09/08/2014   Procedure: CYSTOSCOPY WITH URETEROSCOPY, STONE BASKETRY AND STENT PLACEMENT;  Surgeon: Cleon Gustin, MD;  Location: [facility];  Service: Urology;  Laterality: Left;   CYSTOSCOPY/URETEROSCOPY/HOLMIUM LASER/STENT PLACEMENT Left 09/15/2014   Procedure: CYSTOSCOPY/RETROGRADE/URETEROSCOPY/HOLMIUM LASER/ STONE EXTRACTION /STENT EXCHANGE;  Surgeon: Cleon Gustin, MD;  Location: [facility];  Service: Urology;  Laterality: Left;   HOLMIUM LASER APPLICATION Left 7/50/5183   Procedure: HOLMIUM LASER APPLICATION;  Surgeon: Cleon Gustin, MD;  Location: [facility];  Service: Urology;  Laterality: Left;   HOLMIUM LASER APPLICATION Left 05/19/8249   Procedure: HOLMIUM LASER APPLICATION;  Surgeon: Cleon Gustin, MD;  Location: [facility];  Service: Urology;  Laterality: Left;   INSERT / REPLACE / REMOVE PACEMAKER     LAPAROSCOPIC CHOLECYSTECTOMY     PERMANENT PACEMAKER INSERTION N/A 06/05/2014   MDT Advisa 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   VAGINAL HYSTERECTOMY  1975    No Known Allergies  Immunization History  Administered Date(s) Administered   Pneumococcal Conjugate-13 12/09/2016    Family History  Problem Relation Age of Onset   Hypertension Mother    Hyperlipidemia Mother    Heart disease Mother    Diabetes Father    Stroke Father    Heart disease Father    Hypertension Sister        3 sisters   Arthritis Brother    Heart attack Maternal Grandfather    Stroke Paternal Grandfather    Hypertension Daughter      Current Outpatient Medications:    acetaminophen (TYLENOL)  500 MG tablet, Take 500-1,000 mg by mouth every 6 (six) hours as needed for moderate pain., Disp: , Rfl:    apixaban (ELIQUIS) 5 MG TABS tablet, Take 1 tablet (5 mg total) by mouth 2 (two) times daily. Take 1st dose 06-07-14, Disp: 60 tablet, Rfl: 1   aspirin 81 MG tablet, Take 81  mg by mouth daily. , Disp: , Rfl:    B Complex-Biotin-FA (B-COMPLEX PO), Take 1 capsule by mouth every morning. , Disp: , Rfl:    BIOTIN PO, Take 1 tablet by mouth daily., Disp: , Rfl:    Cholecalciferol (VITAMIN D3) 1000 UNITS CAPS, Take 1,000 Units by mouth daily., Disp: , Rfl:    Coenzyme Q10 (CO Q-10) 200 MG CAPS, Take 200 mg by mouth daily., Disp: , Rfl:    diltiazem (CARDIZEM CD) 240 MG 24 hr capsule, Take 2 capsules (480 mg total) by mouth daily., Disp: 30 capsule, Rfl: 2   diphenhydramine-acetaminophen (TYLENOL PM) 25-500 MG TABS tablet, Take 1 tablet by mouth at bedtime as needed (sleep)., Disp: , Rfl:    fenofibrate 160 MG tablet, TAKE 1 TABLET DAILY, Disp: 90 tablet, Rfl: 0   furosemide (LASIX) 40 MG tablet, Take 1 tablet (40 mg total) by mouth 2 (two) times daily., Disp: 30 tablet, Rfl: 1   HUMULIN 70/30 KWIKPEN (70-30) 100 UNIT/ML PEN, Inject 35 Units into the muscle 2 (two) times daily at 8 am and 10 pm., Disp: , Rfl:    losartan (COZAAR) 50 MG tablet, TAKE 1 TABLET UPTO TWICE A DAY AS DIRECTED (Patient taking differently: Take 50 mg by mouth daily. ), Disp: 180 tablet, Rfl: 2   metFORMIN (GLUCOPHAGE) 500 MG tablet, Take 500 mg by mouth 2 (two) times daily with a meal. , Disp: , Rfl:    metoprolol succinate (TOPROL-XL) 100 MG 24 hr tablet, Take 1 tablet daily with or immediatelyfollowing a meal. (Patient taking differently: Take 100 mg by mouth daily. ), Disp: 30 tablet, Rfl: 0   Omega-3 Fatty Acids (FISH OIL PO), Take 2 capsules by mouth 2 (two) times daily., Disp: , Rfl:    potassium chloride (K-DUR,KLOR-CON) 10 MEQ tablet, Take 10 mEq by mouth every morning. , Disp: , Rfl:    Semaglutide  (RYBELSUS) 3 MG TABS, Take by mouth., Disp: , Rfl:    ezetimibe (ZETIA) 10 MG tablet, Take 10 mg by mouth daily., Disp: , Rfl:       Objective:   Vitals:   12/28/18 1513  BP: (!) 144/70  Pulse: 83  Temp: 98.3 F (36.8 C)  TempSrc: Skin  SpO2: 97%  Weight: 228 lb (103.4 kg)  Height: 5' 4" (1.626 m)    Estimated body mass index is 39.14 kg/m as calculated from the following:   Height as of this encounter: 5' 4" (1.626 m).   Weight as of this encounter: 228 lb (103.4 kg).  _0 @  Filed Weights   12/28/18 1513  Weight: 228 lb (103.4 kg)     Physical Exam  General Appearance:    Alert, cooperative, no distress, appears stated age - older , Deconditioned looking - yes mild , OBESE  - yes, Sitting on Wheelchair -  no  Head:    Normocephalic, without obvious abnormality, atraumatic  Eyes:    PERRL, conjunctiva/corneas clear,  Ears:    Normal TM's and external ear canals, both ears  Nose:   Nares normal, septum midline, mucosa normal, no drainage    or sinus tenderness. OXYGEN ON  - no . Patient is @ ra   Throat:   Lips, mucosa, and tongue normal; teeth and gums normal. Cyanosis on lips - no  Neck:   Supple, symmetrical, trachea midline, no adenopathy;    thyroid:  no enlargement/tenderness/nodules; no carotid   bruit or JVD  Back:     Symmetric, no curvature,  ROM normal, no CVA tenderness  Lungs:     Distress - no , Wheeze no, Barrell Chest - no, Purse lip breathing - no, Crackles - yes at base   Chest Wall:    No tenderness or deformity.    Heart:    Regular rate and rhythm, S1 and S2 normal, no rub   or gallop, Murmur - no  Breast Exam:    NOT DONE  Abdomen:     Soft, non-tender, bowel sounds active all four quadrants,    no masses, no organomegaly. Visceral obesity - yes  Genitalia:   NOT DONE  Rectal:   NOT DONE  Extremities:   Extremities - normal, Has Cane - no, Clubbing - no, Edema - no  Pulses:   2+ and symmetric all extremities  Skin:   Stigmata of  Connective Tissue Disease - NO  Lymph nodes:   Cervical, supraclavicular, and axillary nodes normal  Psychiatric:  Neurologic:   Pleasant - yes, Anxious - no, Flat affect - yes  CAm-ICU - neg, Alert and Oriented x 3 - yes, Moves all 4s - yes, Speech - normal, Cognition - intact           Assessment:       ICD-10-CM   1. ILD (interstitial lung disease) ([facility])  J84.9 Pulse oximetry, overnight    Pulmonary Function Test  2. Long-term exposure involving bird droppings  Z77.29   3. History of amiodarone therapy  Z92.29   4. OSA (obstructive sleep apnea)  G47.33     Patient is finding it hard to believe that this could be related to bird exposure.  She is definitely not interested in surgical lung biopsy or bronchoscopy despite extensive communication with her about trying to make a diagnosis in the limitations without that and the risks and benefits of biopsy.  She is not interested in bronchoscopy and genomic analysis either.  She is also worried about prednisone as a therapy because of her obesity and diabetes.     Plan:     Patient Instructions     ICD-10-CM   1. ILD (interstitial lung disease) ([place])  J84.9   2. Long-term exposure involving bird droppings  Z77.29   3. History of amiodarone therapy  Z92.29   4. 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  This is our thinking based on our case conference with multiple different doctors done today December 28, 2018  -I were thinking is that the history of repeated pneumonias in the past had to do with the bird exposure and also working in [employer] possibly.  After that there was a long slow quiet.  And then the scar tissue in the lung was awakened by the  amiodarone  -To be certain this is the right diagnosis -some kind of a biopsy either bronchoscopy method [which is less invasive but also less specific) or surgical lung biopsy (more specific and more invasive] is recommended but I do respect your concerns over this.  Therefore we will not proceed or pursue this at this point  -It will be very helpful for her husband to have a high-resolution CT scan of the chest to help with his symptoms and also aid your diagnosis.  I have written to Dr. Ellouise Newer  to order CT scan of the chest on him.  This can be done in [place], [place]  -Treatment for this condition usually involves prednisone but I am concerned about the risk with prednisone given your diabetes and weight issues.  PLAN -Therefore my current recommendation is to retest your breathing test with spirometry and DLCO in 1 month and depending on the result consider anti-fibrotic agent that does not have steroid side effects  - Also test ONO on RA sometime next few weeks  OSA (obstructive sleep apnea)  -Please asked Dr. Claiborne Billings for new mask   Follow-up - ONO on room air next few to several weeks -1 month do spirometry and DLCO - Please see if your husband can do HRCT chest by time of next visit  -Return to see Dr. Chase Caller for 30-minute office slot in 1 month   > 50% of this > 40 min visit spent in face to face counseling or/and coordination of care - by this undersigned MD - Dr Brand Males. This includes one or more of the following documented above: discussion of test results, diagnostic or treatment recommendations, prognosis, risks and benefits of management options, instructions, education, compliance or risk-factor reduction   SIGNATURE    Dr. Brand Males, M.D., F.C.C.P,  Pulmonary and Critical Care Medicine Staff Physician, [place] Director - Interstitial Lung Disease  Program  Pulmonary [place] at [place], [place], [address]  Pager: [phone], If no answer or between  15:00h - 7:00h: call [phone] Telephone: [phone]  4:04 PM 12/28/2018

## 2019-01-07 ENCOUNTER — Other Ambulatory Visit: Payer: Self-pay

## 2019-01-07 MED ORDER — DILTIAZEM HCL ER COATED BEADS 240 MG PO CP24: 480.0000 mg | ORAL_CAPSULE | Freq: Every day | ORAL | 2 refills | Status: DC

## 2019-01-12 ENCOUNTER — Other Ambulatory Visit: Payer: Self-pay

## 2019-01-12 ENCOUNTER — Other Ambulatory Visit: Payer: Self-pay | Admitting: Cardiovascular Disease

## 2019-01-12 MED ORDER — METOPROLOL SUCCINATE ER 100 MG PO TB24: ORAL_TABLET | ORAL | 1 refills | Status: DC

## 2019-01-21 ENCOUNTER — Other Ambulatory Visit: Payer: Self-pay | Admitting: Internal Medicine

## 2019-01-25 ENCOUNTER — Telehealth: Payer: Self-pay | Admitting: Cardiovascular Disease

## 2019-01-25 NOTE — Telephone Encounter (Signed)
   Primary Cardiologist: Shelva Majestic, MD  Chart reviewed as part of pre-operative protocol coverage. Simple dental extractions are considered low risk procedures per guidelines and generally do not require any specific cardiac clearance. It is also generally accepted that for simple extractions and dental cleanings, there is no need to interrupt blood thinner therapy.   SBE prophylaxis is not required for the patient.  I will route this recommendation to the requesting party via Epic fax function and remove from pre-op pool.  Please call with questions.  Abigail Butts, PA-C 01/25/2019, 12:50 PM

## 2019-01-25 NOTE — Telephone Encounter (Signed)
  1. What dental office are you calling from? [facility]  2. What is your office phone number? [phone]  3. What is your fax number? [phone]  4. What type of procedure is the patient having performed? Tooth extraction  5. What date is procedure scheduled or is the patient there now? TBD (if the patient is at the dentist's office question goes to their cardiologist if he/she is in the office.  If not, question should go to the DOD).   6. What is your question (ex. Antibiotics prior to procedure, holding medication-we need to know how long dentist wants pt to hold med)?     Will patient need to stop her Eliquis before extraction? How long before?

## 2019-01-28 ENCOUNTER — Other Ambulatory Visit: Payer: Self-pay | Admitting: Internal Medicine

## 2019-01-28 MED ORDER — FUROSEMIDE 40 MG PO TABS: 40.0000 mg | ORAL_TABLET | Freq: Two times a day (BID) | ORAL | 3 refills | Status: DC

## 2019-02-07 ENCOUNTER — Ambulatory Visit (INDEPENDENT_AMBULATORY_CARE_PROVIDER_SITE_OTHER): Payer: Medicare Other | Admitting: *Deleted

## 2019-02-07 DIAGNOSIS — I495 Sick sinus syndrome: Secondary | ICD-10-CM | POA: Diagnosis not present

## 2019-02-07 LAB — CUP PACEART REMOTE DEVICE CHECK
Battery Remaining Longevity: 55 mo
Battery Voltage: 2.99 V
Brady Statistic AP VP Percent: 0.79 %
Brady Statistic AP VS Percent: 80.86 %
Brady Statistic AS VP Percent: 0.91 %
Brady Statistic AS VS Percent: 17.45 %
Brady Statistic RA Percent Paced: 70.55 %
Brady Statistic RV Percent Paced: 1.46 %
Date Time Interrogation Session: 20201123132517
Implantable Lead Implant Date: 20160321
Implantable Lead Implant Date: 20160321
Implantable Lead Location: 753859
Implantable Lead Location: 753860
Implantable Lead Model: 5076
Implantable Lead Model: 5076
Implantable Pulse Generator Implant Date: 20160321
Lead Channel Impedance Value: 380 Ohm
Lead Channel Impedance Value: 494 Ohm
Lead Channel Impedance Value: 570 Ohm
Lead Channel Impedance Value: 589 Ohm
Lead Channel Pacing Threshold Amplitude: 0.625 V
Lead Channel Pacing Threshold Amplitude: 0.875 V
Lead Channel Pacing Threshold Pulse Width: 0.4 ms
Lead Channel Pacing Threshold Pulse Width: 0.4 ms
Lead Channel Sensing Intrinsic Amplitude: 0.25 mV
Lead Channel Sensing Intrinsic Amplitude: 0.25 mV
Lead Channel Sensing Intrinsic Amplitude: 17.625 mV
Lead Channel Sensing Intrinsic Amplitude: 17.625 mV
Lead Channel Setting Pacing Amplitude: 2 V
Lead Channel Setting Pacing Amplitude: 2.5 V
Lead Channel Setting Pacing Pulse Width: 0.4 ms
Lead Channel Setting Sensing Sensitivity: 0.9 mV

## 2019-02-26 ENCOUNTER — Other Ambulatory Visit (HOSPITAL_COMMUNITY)
Admission: RE | Admit: 2019-02-26 | Discharge: 2019-02-26 | Disposition: A | Discharge status: Home / Self Care | Payer: Medicare Other | Source: Ambulatory Visit | Attending: Internal Medicine | Admitting: Internal Medicine

## 2019-02-26 DIAGNOSIS — Z20828 Contact with and (suspected) exposure to other viral communicable diseases: Secondary | ICD-10-CM | POA: Insufficient documentation

## 2019-02-26 DIAGNOSIS — Z01812 Encounter for preprocedural laboratory examination: Secondary | ICD-10-CM | POA: Insufficient documentation

## 2019-02-26 LAB — SARS CORONAVIRUS 2 (TAT 6-24 HRS): SARS Coronavirus 2: NEGATIVE

## 2019-03-01 ENCOUNTER — Other Ambulatory Visit: Payer: Self-pay

## 2019-03-01 ENCOUNTER — Ambulatory Visit (INDEPENDENT_AMBULATORY_CARE_PROVIDER_SITE_OTHER): Payer: Medicare Other | Admitting: Primary Care

## 2019-03-01 ENCOUNTER — Ambulatory Visit: Payer: Medicare Other | Admitting: Internal Medicine

## 2019-03-01 ENCOUNTER — Telehealth: Payer: Self-pay | Admitting: Primary Care

## 2019-03-01 ENCOUNTER — Encounter: Payer: Self-pay | Admitting: Primary Care

## 2019-03-01 DIAGNOSIS — J849 Interstitial pulmonary disease, unspecified: Secondary | ICD-10-CM | POA: Diagnosis not present

## 2019-03-01 DIAGNOSIS — I4891 Unspecified atrial fibrillation: Secondary | ICD-10-CM

## 2019-03-01 LAB — PULMONARY FUNCTION TEST
DL/VA % pred: 155 %
DL/VA: 6.45 ml/min/mmHg/L
DLCO unc % pred: 95 %
DLCO unc: 18.22 ml/min/mmHg
FEF 25-75 Pre: 3.16 L/sec
FEF2575-%Pred-Pre: 178 %
FEV1-%Pred-Pre: 66 %
FEV1-Pre: 1.43 L
FEV1FVC-%Pred-Pre: 122 %
FEV6-%Pred-Pre: 56 %
FEV6-Pre: 1.55 L
FEV6FVC-%Pred-Pre: 104 %
FVC-%Pred-Pre: 54 %
FVC-Pre: 1.55 L
Pre FEV1/FVC ratio: 92 %
Pre FEV6/FVC Ratio: 100 %

## 2019-03-01 NOTE — Assessment & Plan Note (Addendum)
-  HR irregular, 130s. Patient is asymptomatic and denies cp and new dyspnea symptoms.  - Maintained on Toprol XL 160m daily and Cardizem CD 2413mtwice daily - Needs to contact cardiology, if unable to speak with office instructed patient to let usKoreanow today - ED if develops chest pain, dizziness, syncope, worsening dyspnea

## 2019-03-01 NOTE — Telephone Encounter (Signed)
Saw ILD patient today. She is doing ok, no change in breathing. LD symptom scale remains stable. Pulmonary function testing showed overall improvement in FVC and DLCO. Ambulatory walk was stable with no O2 desaturation.   She has elected not to have bx and does not want to be on steroids in the past  You had mentioned anti-fibrotics, I did not start d/t stability of her symptoms and improved PFTs. Is that ok ???  -[employer]

## 2019-03-01 NOTE — Assessment & Plan Note (Addendum)
-  HRCT in September showed significant air trapping. 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

## 2019-03-01 NOTE — Progress Notes (Signed)
_0  ID: [name], female    DOB: 10-12-1945, 73 y.o.   MRN: [id number]  Chief Complaint  Patient presents with  . Follow-up    Referring provider: Hector Brunswick*  HPI: 73 year old female, never smoked. PMH significant for ILD, amiodarone pulmonary toxicity (stopped Winter 2019), restrictive lung disease, sleep apnea (not on cpap), type 2 diabetes, dCHF, CAD s/p CABG, hypertension, paroxysmal afib. Patient of Dr. Valeta Harms and following with Dr. 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 HRCT uncharged from 2019.   Previous [facility] pulmonary encounter: 12/08/2018  Patient contacted today for televist with reports of increased shortness of breath and cough for the last several days. Reports that her heart rate is in the 120's. She is off amiodarone. Her weight is up 5-8 lbs and she has some foot swelling. Taking lasix 64m daily. Cough is productive with yellow mucus. Associated nasal congestion and seasonal allergies. She is not currently taking anything for cough or oral antihistamine. Advised patient contact cardiology d/t elevated HR, dyspnea, LE swelling and weight gain. She was admitted for atrial fibrillation and given IV diltiazem and IV lasix in ED. CXR showed no acute process.   12/28/18- ILD FU with Dr. RChase Caller HRCT discussed at multidisciplinary conference. Significant air trapping documented. There is no craniocaudal gradient.  There is diffuse infiltrates.  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  Patient is finding it hard to believe that this could be related to bird exposure.  She is definitely not interested in surgical lung biopsy or bronchoscopy despite extensive communication with her about trying to make a diagnosis in the limitations without that and the risks and benefits of  biopsy.  She is not interested in bronchoscopy and genomic analysis either.  She is also worried about prednisone as a therapy because of her obesity and diabetes. Current recommendation is to retest your breathing test with spirometry and DLCO in 1 month and depending on the result consider anti-fibrotic agent that does not have steroid side effects. Also test ONO on RA sometime next few weeks  Hx exposure: Second hand smoke, garderner, tobacco grower, cotton production, bird fConservation officer, historic buildings pPhysiological scientist mFirefighter tEngineer, manufacturing systems iMuseum/gallery exhibitions officer amiodarone  03/01/2019 Patient presents today for 3 month follow-up with Spirometry/DLCO. Accompanied by her daughter. Patient is doing well, no changes in breathing. Endorses dyspnea on moderate exertion and with stairs/hills. She is able to do most simple ADLs on her own without symptoms. Dyspnea recovers with rest. No significant cough or mucus production. Her husband had a recent HRCT d/t known bird/poultry exposure, he is not a patient of our office so unable to review results. Dencies fever, chills, chest tightness, wheezing, cough, hemoptysis or weight gain.   Spirometry 03/01/2019 FVC 1.55 (54%), FEV1 1.43 (66%), ratio 92, DLCOunc 18.22 (95%).    SYMPTOM SCALE - ILD Sept 2020 at hospital 12/28/2018  03/01/2019   O2 use  RA =- improved after hospitalization RA  Shortness of Breath  0 -> 5 scale with 5 being worst (score 6 If unable to do)   At rest 1 0 0  Simple tasks - showers, clothes change, eating, shaving _1 Household (dishes, doing bed, laundry) _2 Shopping _3 Walking level at own pace _4 Walking keeping up with others  of same age _0 Walking up Stairs _1 Walking up Hill _2 Total (40 - 48) Dyspnea Score _3 How bad is your cough?  1 1  How bad is your fatigue  3 2    Simple office walk 185 feet x  3 laps goal with forehead probe 12/28/2018  03/01/2019   O2 used RA RA  Number laps  completed 1 of intendeed 3 2 of 2  Comments about pace Pretty slow Slow normal  Resting Pulse Ox/HR 96% and 68/min 99% and 138/min  Final Pulse Ox/HR 89% and 120/min 95% and 141  Desaturated </= 88% no No  Desaturated <= 3% points Yes, 7 points No  Got Tachycardic >/= 90/min yes Yes   Symptoms at end of test Mild dyspnea, stopped due to leg pain Last lap had dyspnea; stopped d/t knee pain  Miscellaneous comments none None    Ref. Range 12/28/2017 15:47 12/12/2018 16:50 - done while in Hospital for diast chf 03/01/2019   FVC-Pre Latest Units: L  1.23 1.55  FVC-%Pred-Pre Latest Units: % 56 42 54    Ref. Range 12/28/2017 15:47 12/12/2018 16:50 03/01/2019   FVC-Post Latest Units: L 1.61  Not done  FVC-%Pred-Post Latest Units: %       Ref. Range 12/28/2017 15:47 12/12/2018 16:50 03/01/2019   DLCO unc Latest Units: ml/min/mmHg 15.97 11.35 18.22  DLCO unc % pred Latest Units: % 67 59 95    No Known Allergies  Immunization History  Administered Date(s) Administered  . Pneumococcal Conjugate-13 12/09/2016    Past Medical History:  Diagnosis Date  . Asthmatic bronchitis   . CHF (congestive heart failure) ([place]) dx'd 2016  . Coronary atherosclerosis of native coronary artery    a. s/p CABG 2005 Dr Roxy Manns    . Hyperlipidemia   . Hypertension   . Hypertensive heart disease   . Kidney stones   . Mixed hyperlipidemia   . Paroxysmal atrial fibrillation ([place])    a. s/p Maze in 2005 b. recurrence in March 2016 with >8sec posttermination pauses s/p PPM implant    . Pneumonia 1950   "double"  . Presence of permanent cardiac pacemaker   . Sleep apnea    "tested; mask ordered; couldn't afford it; will get it now" (09/04/2015)  . TIA (transient ischemic attack)   . Type II diabetes mellitus ([place])   . Uterine cancer [facility])    age 78 with partial hysterectomy    Tobacco History: Social History   Tobacco Use  Smoking Status Never Smoker  Smokeless Tobacco Never Used   Counseling  given: Not Answered   Outpatient Medications Prior to Visit  Medication Sig Dispense Refill  . acetaminophen (TYLENOL) 500 MG tablet Take 500-1,000 mg by mouth every 6 (six) hours as needed for moderate pain.    [name] apixaban (ELIQUIS) 5 MG TABS tablet Take 1 tablet (5 mg total) by mouth 2 (two) times daily. Take 1st dose 06-07-14 60 tablet 1  . aspirin 81 MG tablet Take 81 mg by mouth daily.     . B Complex-Biotin-FA (B-COMPLEX PO) Take 1 capsule by mouth every morning.     [name] BIOTIN PO Take 1 tablet by mouth daily.    . Cholecalciferol (VITAMIN D3) 1000 UNITS CAPS Take 1,000 Units by mouth daily.    . Coenzyme Q10 (CO Q-10) 200 MG CAPS Take 200 mg by mouth daily.    [name] diltiazem (CARDIZEM CD)  240 MG 24 hr capsule Take 2 capsules (480 mg total) by mouth daily. 30 capsule 2  . diphenhydramine-acetaminophen (TYLENOL PM) 25-500 MG TABS tablet Take 1 tablet by mouth at bedtime as needed (sleep).    . ezetimibe (ZETIA) 10 MG tablet Take 10 mg by mouth daily.    . fenofibrate 160 MG tablet TAKE 1 TABLET DAILY 90 tablet 0  . furosemide (LASIX) 40 MG tablet TAKE 2 TABLETS DAILY 180 tablet 0  . furosemide (LASIX) 40 MG tablet Take 1 tablet (40 mg total) by mouth 2 (two) times daily. 60 tablet 3  . losartan (COZAAR) 50 MG tablet TAKE 1 TABLET UPTO TWICE A DAY AS DIRECTED (Patient taking differently: Take 50 mg by mouth daily. ) 180 tablet 2  . metFORMIN (GLUCOPHAGE) 500 MG tablet Take 500 mg by mouth 2 (two) times daily with a meal.     . metoprolol succinate (TOPROL-XL) 100 MG 24 hr tablet Take 1 tablet daily with or immediatelyfollowing a meal. 30 tablet 1  . Omega-3 Fatty Acids (FISH OIL PO) Take 2 capsules by mouth 2 (two) times daily.    . potassium chloride (K-DUR,KLOR-CON) 10 MEQ tablet Take 10 mEq by mouth every morning.     . Semaglutide (RYBELSUS) 3 MG TABS Take by mouth.    [name] HUMULIN 70/30 KWIKPEN (70-30) 100 UNIT/ML PEN Inject 35 Units into the muscle 2 (two) times daily at 8 am and 10 pm.    .  TRESIBA FLEXTOUCH 100 UNIT/ML SOPN FlexTouch Pen INJECT 28 UNITS DAILY FORT30 DAYS     No facility-administered medications prior to visit.   Review of Systems  Review of Systems  Constitutional: Negative.   Respiratory: Positive for shortness of breath. Negative for cough, chest tightness and wheezing.   Cardiovascular: Negative for chest pain and leg swelling.   Physical Exam  BP 128/74 (BP Location: Left Arm, Cuff Size: Normal)   Pulse (!) 134   Temp 97.7 F (36.5 C) (Oral)   Ht 5' 3.5" (1.613 m)   Wt 225 lb (102.1 kg)   SpO2 99%   BMI 39.23 kg/m  Physical Exam Constitutional:      Appearance: Normal appearance.  HENT:     Head: Normocephalic and atraumatic.  Cardiovascular:     Rate and Rhythm: Tachycardia present. Rhythm irregular.  Pulmonary:     Effort: Pulmonary effort is normal.     Breath sounds: Rales present. No wheezing.  Musculoskeletal:        General: Normal range of motion.  Skin:    General: Skin is warm and dry.  Neurological:     General: No focal deficit present.     Mental Status: She is alert and oriented to person, place, and time.  Psychiatric:        Mood and Affect: Mood normal.        Behavior: Behavior normal.        Thought Content: Thought content normal.        Judgment: Judgment normal.      Lab Results:  CBC    Component Value Date/Time   WBC 9.5 12/12/2018 0321   RBC 3.83 (L) 12/12/2018 0321   HGB 11.4 (L) 12/12/2018 0321   HCT 36.0 12/12/2018 0321   PLT 285 12/12/2018 0321   MCV 94.0 12/12/2018 0321   MCH 29.8 12/12/2018 0321   MCHC 31.7 12/12/2018 0321   RDW 15.3 12/12/2018 0321   LYMPHSABS 3.7 12/12/2018 0321   MONOABS 0.7 12/12/2018  0321   EOSABS 0.2 12/12/2018 0321   BASOSABS 0.1 12/12/2018 0321    BMET    Component Value Date/Time   NA 136 12/13/2018 0411   NA 139 03/19/2018 0931   K 3.7 12/13/2018 0411   CL 99 12/13/2018 0411   CO2 29 12/13/2018 0411   GLUCOSE 197 (H) 12/13/2018 0411   BUN 17  12/13/2018 0411   BUN 22 03/19/2018 0931   CREATININE 0.92 12/13/2018 0411   CALCIUM 9.5 12/13/2018 0411   GFRNONAA >60 12/13/2018 0411   GFRAA >60 12/13/2018 0411    BNP    Component Value Date/Time   BNP 110.5 (H) 12/08/2018 1653    ProBNP    Component Value Date/Time   PROBNP 51.0 05/24/2010 0013    Imaging: CUP PACEART REMOTE DEVICE CHECK  Result Date: 02/07/2019 Remote reviewed 17.1% AF burden, V rates elevated Admitted 12/13/18 with AF with RVR - Diltiazem added Next remote 91 days    Assessment & Plan:   ILD (interstitial lung disease) ([place]) - HRCT in September showed significant air trapping. 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 Patient is asymptomatic and denies cp and new dyspnea symptoms.  - Maintained on Toprol XL 149m daily and Cardizem CD 2447mtwice daily - Needs to contact cardiology, if unable to speak with office instructed patient to let usKoreanow today - ED if develops chest pain, dizziness, syncope, worsening dyspnea     ElMartyn EhrichNP 03/01/2019

## 2019-03-01 NOTE — Progress Notes (Signed)
Spirometry and Dlco done today.

## 2019-03-01 NOTE — Patient Instructions (Signed)
Pleasure seeing you today [name]  Your pulmonary function testing is stable, if not some better  You Ambulatory walk was stable, if not some better  Holding off on starting anti-fibrotics  Only concern is your heart rate- recommend you contact Dr. Claiborne Billings with cardiology for elevated HR 130's at rest. No new shortness of breath and no chest pain. Rest of your vital signs are stable.   Follow-up: 3 months with Dr. Chase Caller with ILD clinic Call me back today if not able to get in contact with your cardiologist

## 2019-03-02 ENCOUNTER — Other Ambulatory Visit: Payer: Self-pay

## 2019-03-02 ENCOUNTER — Encounter: Payer: Self-pay | Admitting: Cardiovascular Disease

## 2019-03-02 ENCOUNTER — Ambulatory Visit: Payer: Medicare Other | Admitting: Cardiovascular Disease

## 2019-03-02 VITALS — BP 114/69 | HR 85 | Temp 96.9°F | Ht 63.5 in | Wt 220.0 lb

## 2019-03-02 DIAGNOSIS — Z951 Presence of aortocoronary bypass graft: Secondary | ICD-10-CM | POA: Diagnosis not present

## 2019-03-02 DIAGNOSIS — I10 Essential (primary) hypertension: Secondary | ICD-10-CM

## 2019-03-02 DIAGNOSIS — Z950 Presence of cardiac pacemaker: Secondary | ICD-10-CM

## 2019-03-02 DIAGNOSIS — I2510 Atherosclerotic heart disease of native coronary artery without angina pectoris: Secondary | ICD-10-CM | POA: Diagnosis not present

## 2019-03-02 DIAGNOSIS — I480 Paroxysmal atrial fibrillation: Secondary | ICD-10-CM | POA: Diagnosis not present

## 2019-03-02 DIAGNOSIS — Z7901 Long term (current) use of anticoagulants: Secondary | ICD-10-CM

## 2019-03-02 DIAGNOSIS — G4733 Obstructive sleep apnea (adult) (pediatric): Secondary | ICD-10-CM

## 2019-03-02 DIAGNOSIS — Z9989 Dependence on other enabling machines and devices: Secondary | ICD-10-CM

## 2019-03-02 DIAGNOSIS — E118 Type 2 diabetes mellitus with unspecified complications: Secondary | ICD-10-CM

## 2019-03-02 NOTE — Telephone Encounter (Signed)
Yes he did, he has an apt with his doctor to review and I told them if there were changes to refer here to see you

## 2019-03-02 NOTE — Telephone Encounter (Signed)
If is IPF - you always want to start regardless of stability. IF non-IPF - you will start only if progressive. In her case we are saying non-IPF. So, you are correct in not starting.  I tried to get her husband to have HRCT . Did he do it?  Thanks    SIGNATURE    Dr. Brand Males, M.D., F.C.C.P,  Pulmonary and Critical Care Medicine Staff Physician, [place] Director - Interstitial Lung Disease  Program  Pulmonary [place] [facility] [place], [place], [address]  Pager: [phone], If no answer or between  15:00h - 7:00h: call [phone] Telephone: [phone]  11:26 AM 03/02/2019

## 2019-03-02 NOTE — Progress Notes (Signed)
Patient ID: [name], female   DOB: 20-May-1945, 73 y.o.   MRN: [id number]   Primary M.D.: Dr. [name]  HPI: [name] is a 73 year-old female who presents for a 5 month follow-up cardiology and sleep evaluation.    [name] admits to a history of CAD, diabetes mellitus, peripheral neuropathy, and hypertension.   She underwent CABG revascularization surgery x2 with a maze procedure for atrial fibrillation at that time by Dr. Roxy Manns.  She experienced 2 episodes of palpitations where she felt her heart was temporarily out of rhythm. She denied associated chest tightness. She does note some shortness of breath. . A 2-D echo Doppler study on 10/05/2012 showed normal systolic function with an ejection fraction of 55-60% with mild tissue Doppler abnormality. Her aortic valve was trileaflet and sclerotic with very mild stenosis with a mean gradient of 10 and a peak gradient of 20 mm respectively. There is trivial AR. She also had posterior calcified mitral annulus with mild MR.  A nuclear perfusion study in 2014 revealed mild breast attenuation but otherwise normal perfusion.   She underwent permanent pacemaker insertion by Dr. Crissie Sickles.  She also has had issues with kidney stones and underwent 2 urological procedures in June and July 2016 on her left kidney by Dr. Nicolette Bang.  She developed atrial fibrillation in March and again in June 2017 which occurred at night after drinking cold juice.  She does snore.  She was hospitalized overnight after she presented with AF with a rapid ventricular rate at 129.  She was started on IV diltiazem with ultimate improvement and eventually her oral dose of Cardizem was increased.  Troponins were negative.  An echo during that hospitalization showed an EF of 60-65%.  There was normal wall motion.  There was mild aortic valve stenosis and she had a mean gradient of 10 and peak gradient of 20 with a valve area of 1.35.  She was seen in follow-up by  [name] and was remaining stable.  She saw Dr. [name] on 12/04/2015 and had normal pacemaker function and was maintaining normal sinus rhythm over 99% of the time on her current dose of amiodarone 200 mg.    With her cardiovascular comorbidities and recurrent AF  I recommended that she undergo a sleep study to evaluate for sleep apnea.  This was done on 01/22/2016 and revealed moderate sleep apnea overall within AHI 15.4.  However, sleep apnea was severe during rems sleep at 36 per hour.  Her oxygen nadir was 83%.  She was scheduled to undergo a CPAP titration, but she canceled this appointment in January due to some other conflict.  When I last saw her, she had not yet had her CPAP titration.  I strongly recommended that she have this done.  He Pap titration was done on 07/30/2016 and she was titrated up to 15 cm water pressure.  She had a significant PLMS index of 85.6.  She has been on gabapentin for neuropathy, which will also treat restless legs.  She underwent an echo Doppler study on 07/03/2016 which showed an EF of 60-65%.  She had grade 2 diastolic dysfunction.  She had an upper normal to mildly increased.  The ascending aorta and a thickened mitral valve suggesting moderate stenosis.  There was a mild gradient across her aortic valve.  She had developed mild edema on diltiazem, which improved once this was discontinued.  She denies chest pain.  She continues to take eliquis for anticoagulation  and continues to be on amiodarone 200 mg for her PAF.   She has a new CPAP ResMed AirSense 10 AutoSet unit; setup was 09/30/2016.  She has a ResMed airFit 20 medium size mask.  A download was obtained from August 26 through 12/08/2016.  She is meeting compliance standards and had 93%.  Days of usage and 87% of days with usage greater than 4 hours.  However, she was only averaging 5 hours of sleep per night.  She is set at a 15 cm water pressure.  Her AHI is excellent at 0.1.  Used her machine were recently  due to the fact that she has a broken tooth.  This had resulted in significant teeth pain during the night with her unit on going into this region.  She was recently started on antibiotics by a dentist and will need tooth extraction.  She is sleeping well with her CPAP.  She denies breakthrough snoring.  She denies daytime sleepiness.    She developed an episode of head numbness with lightheadedness and dizziness which resulted in presenting to the emergency room in January 2019.  However, after waiting 4 hours in the waiting room after her blood work was obtained, she ultimately left.  She was seen in the office in follow-up by Fabian Sharp, [place] on April 21, 2017.  At that time her pacemaker was interrogated and showed adequate battery life with normal pacemaker function.  She was atrially paced 99% of the time.  There were no episodes of atrial tachycardia or atrial fibrillation.  She saw Dr. Crissie Sickles for follow-up evaluation and remote pacemaker device check from July 13, 2017 was normal.  She has a history of mixed hyperlipidemia and has had  issues with statin intolerance.  Laboratory in February 2019 showed a total cholesterol to 441, HDL 50, LDL 140, triglycerides 255.  VLDL was 51.  She was seen by Raquel Salvatore Decent and she was given a trial of pravastatin 20 mg to take in addition to her fenofibrate and omega-3 3 fatty acid.    Since I last saw her in May 2019, she has had some issues with bronchitis.  She states that she believes she may be at times retaining some fluid.  She has been using sodium and typically add salt to tomatoes as well as apples.  She also eats food with high sodium content.  She denies any chest pressure.  She is unaware of palpitations.  She had undergone repeat laboratory since the initiation of pravastatin which showed significant improvement in her LDL cholesterol from 140 to 95 but LDL was still increased.  Triglycerides remained elevated at 237 with the LDL at  47.    I saw her in September 2019.  bBood pressure was improved on furosemide 40 mg daily, losartan 50 mg twice a day and Toprol-XL 100 mg.  Pravastatin was further titrated to 40 mg and she continued to be on Zetia with an LDL of 95.  She was maintaining sinus rhythm on amiodarone on Monday and Saturday and was on Eliquis for anticoagulation.  She continued to use CPAP with 100% compliance.  Amiodarone was stopped due to abnormal pulmonary function tests in October 2019 suggesting severe restrictive physiology including possible pulmonary fibrosis.  She saw Almyra Deforest, [place] in follow-up in January 2020.  She underwent a telemedicine visit with Dr. [name] on June 09, 2018 and was maintaining sinus rhythm.  I last saw her on September 15, 2018 at which time she  felt well.  She underwent bilateral cataract surgery on June 8 and June 22.  She was evaluated by Dr. Valeta Harms regarding amiodarone toxicity and will be undergoing follow-up pulmonary function testing with subsequent office visit on September 23, 2018.  She has had issues with ankle edema that has been mild as well as peripheral neuropathy.  She  had lab work done by her primary physician Dr. Melina Copa.  She was not very compliant with CPAP although her AHI was excellent at 0.2/h at 15 cm water pressure.  Again stressed the importance of continued CPAP therapy particularly with her history of atrial fibrillation and cardiovascular comorbidities.  Since I last saw her, she  was hospitalized in September 2020 with atrial fibrillation with RVR treated with diltiazem drip transition to oral diltiazem.  During that hospitalization diltiazem dose was increased to 480 mg daily.  She was seen by pulmonary yesterday on March 01, 2019 and was felt to be in atrial fibrillation with heart rate at 130.  She was added onto my schedule today.  She is unaware of any atrial fibrillation today and her heart rate is improved.  She denies chest tightness or pressure.  She denies  wheezing.  She presents for evaluation.  Past Medical History:  Diagnosis Date  . Asthmatic bronchitis   . CHF (congestive heart failure) ([place]) dx'd 2016  . Coronary atherosclerosis of native coronary artery    a. s/p CABG 2005 Dr Roxy Manns    . Hyperlipidemia   . Hypertension   . Hypertensive heart disease   . Kidney stones   . Mixed hyperlipidemia   . Paroxysmal atrial fibrillation ([place])    a. s/p Maze in 2005 b. recurrence in March 2016 with >8sec posttermination pauses s/p PPM implant    . Pneumonia 1950   "double"  . Presence of permanent cardiac pacemaker   . Sleep apnea    "tested; mask ordered; couldn't afford it; will get it now" (09/04/2015)  . TIA (transient ischemic attack)   . Type II diabetes mellitus ([place])   . Uterine cancer [facility])    age 76 with partial hysterectomy    Past Surgical History:  Procedure Laterality Date  . CARDIAC CATHETERIZATION  2005  . CORONARY ARTERY BYPASS GRAFT  2005   Dr Roxy Manns with MAZE  . CYSTOSCOPY WITH URETEROSCOPY, STONE BASKETRY AND STENT PLACEMENT Left 09/08/2014   Procedure: CYSTOSCOPY WITH URETEROSCOPY, STONE BASKETRY AND STENT PLACEMENT;  Surgeon: Cleon Gustin, MD;  Location: [facility];  Service: Urology;  Laterality: Left;  . CYSTOSCOPY/URETEROSCOPY/HOLMIUM LASER/STENT PLACEMENT Left 09/15/2014   Procedure: CYSTOSCOPY/RETROGRADE/URETEROSCOPY/HOLMIUM LASER/ STONE EXTRACTION /STENT EXCHANGE;  Surgeon: Cleon Gustin, MD;  Location: [facility];  Service: Urology;  Laterality: Left;  . HOLMIUM LASER APPLICATION Left 1/58/3094   Procedure: HOLMIUM LASER APPLICATION;  Surgeon: Cleon Gustin, MD;  Location: [facility];  Service: Urology;  Laterality: Left;  . HOLMIUM LASER APPLICATION Left 0/09/6806   Procedure: HOLMIUM LASER APPLICATION;  Surgeon: Cleon Gustin, MD;  Location: [facility];  Service: Urology;  Laterality: Left;  . INSERT / REPLACE / REMOVE PACEMAKER    . LAPAROSCOPIC CHOLECYSTECTOMY    . 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 VAGINAL HYSTERECTOMY  1975    No Known Allergies  Current Outpatient Medications  Medication Sig Dispense Refill  . acetaminophen (TYLENOL) 500 MG tablet Take 500-1,000 mg by mouth every 6 (six) hours as needed  for moderate pain.    [name] apixaban (ELIQUIS) 5 MG TABS tablet Take 1 tablet (5 mg total) by mouth 2 (two) times daily. Take 1st dose 06-07-14 60 tablet 1  . aspirin 81 MG tablet Take 81 mg by mouth daily.     . B Complex-Biotin-FA (B-COMPLEX PO) Take 1 capsule by mouth every morning.     [name] BIOTIN PO Take 1 tablet by mouth daily.    . Cholecalciferol (VITAMIN D3) 1000 UNITS CAPS Take 1,000 Units by mouth daily.    . Coenzyme Q10 (CO Q-10) 200 MG CAPS Take 200 mg by mouth daily.    [name] diltiazem (CARDIZEM CD) 240 MG 24 hr capsule Take 2 capsules (480 mg total) by mouth daily. 30 capsule 2  . diphenhydramine-acetaminophen (TYLENOL PM) 25-500 MG TABS tablet Take 1 tablet by mouth at bedtime as needed (sleep).    . ezetimibe (ZETIA) 10 MG tablet Take 10 mg by mouth daily.    . fenofibrate 160 MG tablet TAKE 1 TABLET DAILY 90 tablet 0  . furosemide (LASIX) 40 MG tablet TAKE 2 TABLETS DAILY 180 tablet 0  . furosemide (LASIX) 40 MG tablet Take 1 tablet (40 mg total) by mouth 2 (two) times daily. 60 tablet 3  . losartan (COZAAR) 50 MG tablet TAKE 1 TABLET UPTO TWICE A DAY AS DIRECTED (Patient taking differently: Take 50 mg by mouth daily. ) 180 tablet 2  . metFORMIN (GLUCOPHAGE) 500 MG tablet Take 500 mg by mouth 2 (two) times daily with a meal.     . metoprolol succinate (TOPROL-XL) 100 MG 24 hr tablet Take 1 tablet daily with or immediatelyfollowing a meal. 30 tablet 1  . Omega-3 Fatty Acids (FISH OIL PO) Take 2 capsules by mouth 2 (two) times daily.    . potassium chloride (K-DUR,KLOR-CON) 10 MEQ tablet Take 10 mEq by mouth every morning.     . Semaglutide (OZEMPIC, 0.25 OR 0.5 MG/DOSE, [place]) Inject  0.5 mg into the skin once a week.    . Semaglutide (RYBELSUS) 3 MG TABS Take by mouth.    . TRESIBA FLEXTOUCH 100 UNIT/ML SOPN FlexTouch Pen 56 Units.      No current facility-administered medications for this visit.    Socially she is married to [employer]; she has 2 children one grandchild. She is retired. She completed ninth grade education. Has no history of tobacco use. She is not drink alcohol. She does walk approximately 2-3 times per week.  Family History  Problem Relation Age of Onset  . Hypertension Mother   . Hyperlipidemia Mother   . Heart disease Mother   . Diabetes Father   . Stroke Father   . Heart disease Father   . Hypertension Sister        3 sisters  . Arthritis Brother   . Heart attack Maternal Grandfather   . Stroke Paternal Grandfather   . 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 Valeta Harms Cardiovascular: He history of present illness GI: Negative; No nausea, vomiting, diarrhea, or abdominal pain GU: Positive for  left uteroscopic stone manipulation with laser lithotripsy for left kidney stone Musculoskeletal: Negative; no myalgias, joint pain, or weakness Hematologic/Oncology: Negative; no easy bruising, bleeding Endocrine: Positive for diabetes mellitus Neuro: Positive for peripheral neuropathy Skin: Negative; No rashes or skin  lesions Psychiatric: Negative; No behavioral problems, depression Sleep: Positive for OSA, Recently started on CPAP therapy;  no daytime sleepiness, hypersomnolence, bruxism, restless legs, hypnogognic hallucinations, no cataplexy Other comprehensive 14 point system review is negative.   PE BP 114/69   Pulse 85   Temp (!) 96.9 F (36.1 C)   Ht 5' 3.5" (1.613 m)   Wt 220 lb (99.8 kg)   SpO2  98%   BMI 38.36 kg/m    Repeat blood pressure by me was 118/70  Wt Readings from Last 3 Encounters:  03/02/19 220 lb (99.8 kg)  03/01/19 225 lb (102.1 kg)  12/28/18 228 lb (103.4 kg)   General: Alert, oriented, no distress.  Skin: normal turgor, no rashes, warm and dry HEENT: Normocephalic, atraumatic. Pupils equal round and reactive to light; sclera anicteric; extraocular muscles intact;  Nose without nasal septal hypertrophy Mouth/Parynx benign; Mallinpatti scale 3 Neck: No JVD, no carotid bruits; normal carotid upstroke Lungs: clear to ausculatation and percussion; no wheezing or rales Chest wall: without tenderness to palpitation Heart: PMI not displaced, regular rhythm, s1 s2 normal, 1/6 systolic murmur, no diastolic murmur, no rubs, gallops, thrills, or heaves Abdomen: soft, nontender; no hepatosplenomehaly, BS+; abdominal aorta nontender and not dilated by palpation. Back: no CVA tenderness Pulses 2+ Musculoskeletal: full range of motion, normal strength, no joint deformities Extremities: Trace ankle edema, no clubbing cyanosis, Homan's sign negative  Neurologic: grossly nonfocal; Cranial nerves grossly wnl Psychologic: Normal mood and affect   03/02/2019 repeat ECG after prolonged auscultation suggested a regular rhythm: Possible accelerated junctional rhythm at 69 bpm.  Rhythm is regular.  No clear discernible P waves   Initial 03/02/2019 ECG (independently read by me): Atrial fibrillation at 85 bpm.  Rightward axis.  Nonspecific ST changes.  July 2020 ECG (independently read by me): Atrially paced rhythm at 85 bpm; prolonged AV conduction with PR interval 246 ms.  Nonspecific ST changes seconds  September 2018 ECG (independently read by me): Atrially paced at 84 bpm.  Incomplete right bundle branch block.  Anterolateral ST abnormality.  May 2019 ECG (independently read by me): Atrially paced rhythm with ventricular rate at 67 bpm.  Prolonged AV conduction with PR  interval 272 ms.  QTC prolongation _0 ms.  Nonspecific ST-T changes.  September 2018 ECG (independently read by me): Atrially paced rhythm at 83 bpm with prolonged AV conduction with a PR interval 284 ms.  QTc interval 484 ms.  Nonspecific ST changes.  March 2018 ECG (independently read by me): Atrially paced rhythm at 81 bpm.  Prolonged AV conduction with a PR interval at 258 ms.  QTc interval 473 ms.  Nonspecific ST-T changes.  September 2017 ECG (independently read by me): Atrially paced rhythm with a PR interval at 248.  Complete right bundle branch block.  QTc interval 458 ms.  March 2017 ECG (independently read by me):  Atrially paced rhythm with prolonged AV conduction with PR interval 232 ms.  Nonspecific ST-T changes.  September 2016 ECG (independently read by me): A paced rhythm at 63 with prolonged AV conduction with a PR 214.  QTC normal at 446 ms.  July 2015 ECG (independently read by me): Sinus rhythm with short PR 88 ms.  Nonspecific T changes.  Incomplete right bundle branch block  10/2013 ECG: Sinus rhythm at 69 beats per minute. QTc interval 445 ms.  LABS: BMP Latest Ref Rng & Units 12/13/2018 12/12/2018 12/11/2018  Glucose 70 - 99 mg/dL 197(H) 177(H) 197(H)  BUN 8 -  23 mg/dL _0 Creatinine 0.44 - 1.00 mg/dL 0.92 0.94 1.03(H)  BUN/Creat Ratio 12 - 28 - - -  Sodium 135 - 145 mmol/L 136 138 137  Potassium 3.5 - 5.1 mmol/L 3.7 3.5 3.9  Chloride 98 - 111 mmol/L 99 98 99  CO2 22 - 32 mmol/L _1 Calcium 8.9 - 10.3 mg/dL 9.5 9.3 9.4   Hepatic Function Latest Ref Rng & Units 12/11/2018 03/19/2018 10/20/2017  Total Protein 6.5 - 8.1 g/dL 7.1 7.0 7.0  Albumin 3.5 - 5.0 g/dL 3.7 4.5 4.3  AST 15 - 41 U/L 50(H) 40 53(H)  ALT 0 - 44 U/L _2 Alk Phosphatase 38 - 126 U/L 55 50 46  Total Bilirubin 0.3 - 1.2 mg/dL 1.0 0.3 0.4  Bilirubin, Direct <=0.2 mg/dL - - -   CBC Latest Ref Rng & Units 12/12/2018 12/11/2018 12/10/2018  WBC 4.0 - 10.5 K/uL 9.5 9.0 8.8  Hemoglobin  12.0 - 15.0 g/dL 11.4(L) 12.4 11.5(L)  Hematocrit 36.0 - 46.0 % 36.0 37.7 35.0(L)  Platelets 150 - 400 K/uL 285 282 289   Lab Results  Component Value Date   MCV 94.0 12/12/2018   MCV 93.1 12/11/2018   MCV 93.6 12/10/2018   Lab Results  Component Value Date   TSH 2.088 09/05/2015   Lab Results  Component Value Date   HGBA1C 10.9 (H) 12/09/2018   Lipid Panel     Component Value Date/Time   CHOL 188 03/19/2018 0931   TRIG 228 (H) 03/19/2018 0931   HDL 48 03/19/2018 0931   CHOLHDL 3.9 03/19/2018 0931   CHOLHDL 4.7 09/08/2016 0910   VLDL 42 (H) 09/08/2016 0910   LDLCALC 94 03/19/2018 0931   Laboratory from Dr. Hughie Closs office from   February 22,017. Lipid studies revealed a total cholesterol 186, LDL cholesterol 80, triglycerides 333 and VLDL cholesterol 67. Her glucose was 283.   Laboratory from Dr. Hughie Closs office from 11/27/2014: Fasting glucose 220, BUN 16, creatinine 0.66.  CO2 30.5.  Hemoglobin 12.1, hematocrit 37.3.  Hemoglobin A1c 9.4.  Total cholesterol 204, triglycerides 261, HDL 44, LDL 108, VLDL 52.   RADIOLOGY: No results found.  IMPRESSION:  1. Paroxysmal atrial fibrillation (HCC)   2. CAD in native artery   3. Hx of CABG 2005   4. Essential hypertension   5. OSA on CPAP   6. Pacemaker   7. Chronic anticoagulation   8. Type 2 diabetes mellitus with complication, without long-term current use of insulin (HCC)     ASSESSMENT AND PLAN:   [name] is a 73 year old female who has a history of type 2 diabetes mellitus and is status post CABG surgery x2 by Dr. Roxy Manns in 2005. She has  a history of atrial fibrillation and underwent a Maze procedure at the time of her surgery.  She is status post permanent pacemaker insertion by Dr. [name].  She had been on amiodarone which ultimately was weaned and DC'd due to concerns for amiodarone toxicity and interstitial lung disease.  She had presented to pulmonary yesterday for 58-monthfollow-up  spirometry/DLCO.  At the time she was notably back in atrial fibrillation with heart rates in the 130s.  She has been on high-dose diltiazem at 480 mg daily in addition to Toprol-XL 100 mg.  On her initial ECG today this revealed atrial fibrillation with a ventricular rate in the mid 80s.  However, during prolonged auscultation her heart rate and rhythm was regular  and a subsequent ECG suggested possible junctional rhythm with heart rate at 69.[name]  Her blood pressure is stable.  She is not having any anginal symptoms.  She did not have any wheezing.  She states that she has been using her CPAP more consistently.  She did not have any residual daytime sleepiness with an Epworth Sleepiness score calculated at 4 today.  Her blood pressure is stable on losartan, Toprol and diltiazem.  She continues to be anticoagulated with Eliquis 5 mg twice a day.  She is on Zetia, fenofibrate and omega-3 fatty acid for her mixed hyperlipidemia.  She is diabetic on metformin.  I have recommended she follow-up with Dr. Crissie Sickles.  I will see her in 3 to 4 months for reevaluation.  Time spent: 30 minutes Troy Sine, MD, [facility] 03/04/2019 5:28 PM

## 2019-03-02 NOTE — Patient Instructions (Signed)
Medication Instructions:  The current medical regimen is effective;  continue present plan and medications as directed. Please refer to the Current Medication list given to you today. If you need a refill on your cardiac medications before your next appointment, please call your pharmacy.  Follow-Up: AFTER FOLLOW UP APPTOINTMENT  In Person Shelva Majestic, MD.    Special Instructions: MAKE SURE TO FOLLOW UP WITH DR [name]  MAKE SURE TO FOLLOW UP WITH PULMONARY.  Reduce your risk of getting COVID-19 With your heart disease it is especially important for people at increased risk of severe illness from COVID-19, and those who live with them, to protect themselves from getting COVID-19. The best way to protect yourself and to help reduce the spread of the virus that causes COVID-19 is to: [name] Limit your interactions with other people as much as possible. . Take precautions to prevent getting COVID-19 when you do interact with others. If you start feeling sick and think you may have COVID-19, get in touch with your healthcare provider within 24  At [facility], you and your health needs are our priority.  As part of our continuing mission to provide you with exceptional heart care, we have created designated Provider Care Teams.  These Care Teams include your primary Cardiologist (physician) and Advanced Practice Providers (APPs -  Physician Assistants and Nurse Practitioners) who all work together to provide you with the care you need, when you need it.  Thank you for choosing [facility] at [facility]!!        Happy Holidays!!

## 2019-03-04 ENCOUNTER — Encounter: Payer: Self-pay | Admitting: Cardiovascular Disease

## 2019-03-15 ENCOUNTER — Encounter: Payer: Self-pay | Admitting: *Deleted

## 2019-03-21 ENCOUNTER — Other Ambulatory Visit: Payer: Self-pay | Admitting: Cardiovascular Disease

## 2019-03-21 ENCOUNTER — Ambulatory Visit: Payer: Medicare Other | Admitting: Cardiovascular Disease

## 2019-03-23 ENCOUNTER — Other Ambulatory Visit: Payer: Self-pay

## 2019-03-23 MED ORDER — METOPROLOL SUCCINATE ER 100 MG PO TB24: ORAL_TABLET | ORAL | 3 refills | Status: DC

## 2019-03-23 NOTE — Progress Notes (Signed)
Cardiology Office Note Date:  03/24/2019  Patient ID:  [name] December 23, 1945, MRN [id number] PCP:  Wannetta Sender, FNP  Cardiologist:  Dr. Claiborne Billings Electrophysiologist: Dr. [name]    Chief Complaint: follow up recommended by Dr. Claiborne Billings  History of Present Illness: [name] is a 74 y.o. female with history of HTN, HLD, TIA, DM, Afib, CAD (CABG and MAZE 2005), post termination pauses >> PPM, ILD, OSA w/CPAP.  She comes in today to be seen for Dr. [name].  Last seen by him march 2020, at that time doing well, was several weeks off the amiodarone noting the pt reported almost immediately feeling better, her cough, , SOB, and her edema all reported as better, she was in AFib, planned to f/u in 4 mo to revisit rhythm.  She was hospitalized [facility] with progressive SOB, treated for acute/chronic CHF, Afib w/RVR treated with dilt gtt transitioned to PO and added to her regime at d/c, pulmonary followed as well, planned for booadened evaluation into ILD and out patient follow up, discharged 12/13/2018.  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  The pt apparently with h/o being bird Conservation officer, historic buildings, Sales executive breeder, Firefighter, Engineer, manufacturing systems, Museum/gallery exhibitions officer, amiodarone  She was having difficulty accepting her lung disease likely 2/2 bird exposures. She had stable DOE (hills, stairs, or mod exertion), was not interested in pursuing broncoscopy, biopsy, of genomic evaluation, wanted to avoid steroid therapy. PFTs stable.  Her HR was noted elevated (130's) and recommended to f/u with cardiology  She saw Dr. Claiborne Billings 03/02/2019, she presented with HR 80's, and settled to 60's, she was on high dose rate limiting meds already, no changes were made with recommendation to f/u with  EP.  She is suprised to hear her HR is fast and out of rhythm, has no sense of palpitations or cardiac awareness.  She has some baseline DOE that she says is unchanged, able to do her usual ADL:s, gets winded with more exertional activities like steps, fast paced walking.  Denies any CP, no near syncope or syncope. In general she feels like all-in-all she likely feels a bit better overall then she did 3 months ago. She does though think she is on too much diltiazem, and thinks this is making it hard for her to breath at night.  2 nights ago she was unable to breath in bed, had to sit up, and that night slept minimally if at all. She was having some breathless ness at night leading up to 2 nights ago, but since has been a little more comfortable and self reduced the dilt to one capsule daily   Device information MDT dual chamber PPM, implanted 06/05/2014 AFib hx MAZE 2005 AAD  Amiodarone stopped Oct 2019 2/2 concerns of pulmonary tox   Past Medical History:  Diagnosis Date  . Asthmatic bronchitis   . CHF (congestive heart failure) ([place]) dx'd 2016  . Coronary atherosclerosis of native coronary artery    a. s/p CABG 2005 Dr Roxy Manns    . Hyperlipidemia   . Hypertension   . Hypertensive heart disease   . Kidney stones   . Mixed hyperlipidemia   . Nephrolithiasis 06/07/2015  . Paroxysmal atrial fibrillation ([place])    a. s/p Maze in 2005 b. recurrence in March 2016 with >8sec posttermination pauses s/p PPM implant    .  Pneumonia 1950   "double"  . Presence of permanent cardiac pacemaker   . Restrictive lung disease 03/03/2018  . Sleep apnea    "tested; mask ordered; couldn't afford it; will get it now" (09/04/2015)  . TIA (transient ischemic attack)   . Type II diabetes mellitus ([place])   . Uterine cancer [facility])    age 69 with partial hysterectomy    Past Surgical History:  Procedure Laterality Date  . CARDIAC CATHETERIZATION  2005  . CORONARY ARTERY BYPASS GRAFT  2005   Dr Roxy Manns with  MAZE  . CYSTOSCOPY WITH URETEROSCOPY, STONE BASKETRY AND STENT PLACEMENT Left 09/08/2014   Procedure: CYSTOSCOPY WITH URETEROSCOPY, STONE BASKETRY AND STENT PLACEMENT;  Surgeon: Cleon Gustin, MD;  Location: [facility];  Service: Urology;  Laterality: Left;  . CYSTOSCOPY/URETEROSCOPY/HOLMIUM LASER/STENT PLACEMENT Left 09/15/2014   Procedure: CYSTOSCOPY/RETROGRADE/URETEROSCOPY/HOLMIUM LASER/ STONE EXTRACTION /STENT EXCHANGE;  Surgeon: Cleon Gustin, MD;  Location: [facility];  Service: Urology;  Laterality: Left;  . HOLMIUM LASER APPLICATION Left 1/60/7371   Procedure: HOLMIUM LASER APPLICATION;  Surgeon: Cleon Gustin, MD;  Location: [facility];  Service: Urology;  Laterality: Left;  . HOLMIUM LASER APPLICATION Left 0/08/2692   Procedure: HOLMIUM LASER APPLICATION;  Surgeon: Cleon Gustin, MD;  Location: [facility];  Service: Urology;  Laterality: Left;  . INSERT / REPLACE / REMOVE PACEMAKER    . LAPAROSCOPIC CHOLECYSTECTOMY    . 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 VAGINAL HYSTERECTOMY  1975    Current Outpatient Medications  Medication Sig Dispense Refill  . acetaminophen (TYLENOL) 500 MG tablet Take 500-1,000 mg by mouth every 6 (six) hours as needed for moderate pain.    [name] apixaban (ELIQUIS) 5 MG TABS tablet Take 1 tablet (5 mg total) by mouth 2 (two) times daily. Take 1st dose 06-07-14 60 tablet 1  . aspirin 81 MG tablet Take 81 mg by mouth daily.     . B Complex-Biotin-FA (B-COMPLEX PO) Take 1 capsule by mouth every morning.     [name] BIOTIN PO Take 1 tablet by mouth daily.    . Cholecalciferol (VITAMIN D3) 1000 UNITS CAPS Take 1,000 Units by mouth daily.    . Coenzyme Q10 (CO Q-10) 200 MG CAPS Take 200 mg by mouth daily.    [name] diltiazem (CARDIZEM CD) 240 MG 24 hr capsule Take 2 capsules (480 mg total) by mouth daily. 30 capsule 2  . diphenhydramine-acetaminophen (TYLENOL PM) 25-500 MG TABS tablet  Take 1 tablet by mouth at bedtime as needed (sleep).    . ezetimibe (ZETIA) 10 MG tablet Take 10 mg by mouth daily.    . fenofibrate 160 MG tablet TAKE 1 TABLET DAILY 90 tablet 0  . furosemide (LASIX) 80 MG tablet Take 80 mg by mouth daily. Take 80 in the am and 40 in pm     . losartan (COZAAR) 50 MG tablet TAKE 1 TABLET UPTO TWICE A DAY AS DIRECTED (Patient taking differently: Take 50 mg by mouth daily. ) 180 tablet 2  . metFORMIN (GLUCOPHAGE) 500 MG tablet Take 500 mg by mouth 2 (two) times daily with a meal.     . metoprolol succinate (TOPROL-XL) 100 MG 24 hr tablet Take 1 tablet daily with or immediatelyfollowing a meal. 90 tablet 3  . Omega-3 Fatty Acids (FISH OIL PO) Take 2 capsules by mouth 2 (two) times daily.    . potassium chloride (K-DUR,KLOR-CON) 10  MEQ tablet Take 10 mEq by mouth every morning.     . potassium chloride (KLOR-CON) 10 MEQ tablet Take 20 mEq by mouth daily.    . Semaglutide (OZEMPIC, 0.25 OR 0.5 MG/DOSE, [place]) Inject 0.5 mg into the skin once a week.    . Semaglutide (RYBELSUS) 3 MG TABS Take by mouth.    . TRESIBA FLEXTOUCH 100 UNIT/ML SOPN FlexTouch Pen 56 Units.      No current facility-administered medications for this visit.    Allergies:   Patient has no known allergies.   Social History:  The patient  reports that she has never smoked. She has never used smokeless tobacco. She reports that she does not drink alcohol or use drugs.   Family History:  The patient's family history includes Arthritis in her brother; Diabetes in her father; Heart attack in her maternal grandfather; Heart disease in her father and mother; Hyperlipidemia in her mother; Hypertension in her daughter, mother, and sister; Stroke in her father and paternal grandfather.  ROS:  Please see the history of present illness.  All other systems are reviewed and otherwise negative.   PHYSICAL EXAM:  VS:  BP 140/84   Pulse (!) 135   Ht 5' 3.5" (1.613 m)   Wt 226 lb (102.5 kg)   SpO2 98%   BMI  39.41 kg/m  BMI: Body mass index is 39.41 kg/m. Well nourished, well developed, in no acute distress  HEENT: normocephalic, atraumatic  Neck: no JVD, carotid bruits or masses Cardiac:  RRR, tachycardic; no significant murmurs, no rubs, or gallops Lungs:  CTA b/l, no wheezing, rhonchi or rales  Abd: soft, nontender, obese MS: no deformity or atrophy Ext: trace edema only Skin: warm and dry, no rash Neuro:  No gross deficits appreciated Psych: euthymic mood, full affect  PPM site is stable, no tethering or discomfort   EKG:  AFlutter 135bpm, faster, otherwise similar to prior  PPM interrogation done today and reviewed by myself:  Battery , lead impedances, sensing are good Unable to test thresholds V rate too fast, and in an Aflutter HR histogram with majorty of her HR 120's-130's   12/09/2018: TTE IMPRESSIONS 1. Left ventricular ejection fraction, by visual estimation, is 55%. The left ventricle has normal function. Normal left ventricular size. There is mildly increased left ventricular hypertrophy.  2. Definity contrast agent was given IV to delineate the left ventricular endocardial borders.  3. Left ventricular diastolic Doppler parameters are indeterminate pattern of LV diastolic filling.  4. The RV was poorly visualized. Global right ventricle has mildly reduced systolic function.The right ventricular size is mildly enlarged. No increase in right ventricular wall thickness. D-shaped interventricular septum suggestive of RV  pressure/volume overload.  5. Moderate mitral annular calcification.  6. The mitral valve is degenerative. No evidence of mitral valve regurgitation. No evidence of mitral stenosis.  7. The tricuspid valve is normal in structure. Tricuspid valve regurgitation is trivial.  8. The aortic valve is tricuspid Aortic valve regurgitation is trivial by color flow Doppler. Mild to moderate aortic valve sclerosis/calcification without any evidence of aortic  stenosis.  9. There is mild dilatation of the ascending aorta measuring 37 mm. 10. A pacer wire is visualized. 11. Left atrial size was mildly dilated. 12. The inferior vena cava is dilated in size with <50% respiratory variability, suggesting right atrial pressure of 15 mmHg. 13. The tricuspid regurgitant velocity is 2.73 m/s, and with an assumed right atrial pressure of 15 mmHg, the estimated right ventricular  systolic pressure is moderately elevated at 44.8 mmHg. 14. Right atrial size was not well visualized.    10/07/2012: stress myoview Impression Exercise Capacity:  Fair exercise capacity. BP Response:  Normal blood pressure response. Clinical Symptoms:  No significant symptoms noted. ECG Impression:  No significant ST segment change suggestive of ischemia. Comparison with Prior Nuclear Study: No images to compare  Overall Impression:  Low risk stress nuclear study Breast attenuation artifact.  LV Wall Motion:  NL LV Function; NL Wall Motion    Recent Labs: 12/08/2018: B Natriuretic Peptide 110.5 12/10/2018: Magnesium 1.6 12/11/2018: ALT 24 12/12/2018: Hemoglobin 11.4; Platelets 285 12/13/2018: BUN 17; Creatinine, Ser 0.92; Potassium 3.7; Sodium 136  No results found for requested labs within last 8760 hours.   CrCl cannot be calculated (Patient's most recent lab result is older than the maximum 21 days allowed.).   Wt Readings from Last 3 Encounters:  03/24/19 226 lb (102.5 kg)  03/02/19 220 lb (99.8 kg)  03/01/19 225 lb (102.1 kg)     Other studies reviewed: Additional studies/records reviewed today include: summarized above  ASSESSMENT AND PLAN:   1. PPM     No programming changes made  2. Longstanding persistent Afib     CHA2DS2Vasc is 7, on eliquis, appropriately dosed     RVR despite aggressive rate control attempts  Dr. [name] saw the patient today as well.  Discussed rate control strategies have failed, she can not have amiodarone, and her QT is too long  at baseline to try Tikosyn Recommended AV node ablation, discussed the procedure, potential risks, benefits, and rational for it She was hesitant though after lengthy discussion and concerns of eventually ending up with a CM and worsened CHF she is agreeable  She is added to his case Monday She was given instructions to hold Eliquis the day prior and morning of her procedure  3. CAD     No anginal symptoms     Continue with Dr. Claiborne Billings  4. HTN     Tolerating current meds  5. Volume OL     Her exam today does not suggest overt volume OL, though she describes significant orthopnea a couple nights ago.     Given her RVR and unable to rate control, will have her take her 47m lasic AM and a 470mat night, and increase her Kdur to 2049mdaily      Labs today, pending these, may adjust    Disposition: F/u with AVNode ablation Monday routine post procedure follow up.  Current medicines are reviewed at length with the patient today.  The patient did not have any concerns regarding medicines.  SigVenetia NightA-C 03/24/2019 1:21 PM     [place] [phone])  [phone])

## 2019-03-24 ENCOUNTER — Other Ambulatory Visit (HOSPITAL_COMMUNITY)
Admission: RE | Admit: 2019-03-24 | Discharge: 2019-03-24 | Disposition: A | Discharge status: Home / Self Care | Payer: Medicare Other | Source: Ambulatory Visit | Attending: Internal Medicine | Admitting: Internal Medicine

## 2019-03-24 ENCOUNTER — Other Ambulatory Visit: Payer: Self-pay

## 2019-03-24 ENCOUNTER — Ambulatory Visit: Payer: Medicare Other | Admitting: Physician Assistant

## 2019-03-24 ENCOUNTER — Encounter: Payer: Self-pay | Admitting: *Deleted

## 2019-03-24 VITALS — BP 140/84 | HR 135 | Ht 63.5 in | Wt 226.0 lb

## 2019-03-24 DIAGNOSIS — I2510 Atherosclerotic heart disease of native coronary artery without angina pectoris: Secondary | ICD-10-CM

## 2019-03-24 DIAGNOSIS — I10 Essential (primary) hypertension: Secondary | ICD-10-CM | POA: Diagnosis not present

## 2019-03-24 DIAGNOSIS — Z20822 Contact with and (suspected) exposure to covid-19: Secondary | ICD-10-CM | POA: Diagnosis not present

## 2019-03-24 DIAGNOSIS — I5030 Unspecified diastolic (congestive) heart failure: Secondary | ICD-10-CM

## 2019-03-24 DIAGNOSIS — Z01812 Encounter for preprocedural laboratory examination: Secondary | ICD-10-CM | POA: Insufficient documentation

## 2019-03-24 DIAGNOSIS — I4891 Unspecified atrial fibrillation: Secondary | ICD-10-CM

## 2019-03-24 DIAGNOSIS — Z950 Presence of cardiac pacemaker: Secondary | ICD-10-CM | POA: Diagnosis not present

## 2019-03-24 DIAGNOSIS — I480 Paroxysmal atrial fibrillation: Secondary | ICD-10-CM

## 2019-03-24 NOTE — Patient Instructions (Addendum)
Medication Instructions:   START TAKING LASIX 80 MG IN THE AM AND 40 IN THE PM   START POTASSIUM 20 MEQ  ONCE A DAY   *If you need a refill on your cardiac medications before your next appointment, please call your pharmacy*  Lab Work:  BMET AND CBC TODAY   COVID TEST  03-28-19 TODAY 1.25 PM  [place]  If you have labs (blood work) drawn today and your tests are completely normal, you will receive your results only by: [name] MyChart Message (if you have MyChart) OR . A paper copy in the mail If you have any lab test that is abnormal or we need to change your treatment, we will call you to review the results.  Testing/Procedures: SEE LETTER FOR AV NODE ABLATION ON 03-28-19. Your physician has recommended that you have an ablation. Catheter ablation is a medical procedure used to treat some cardiac arrhythmias (irregular heartbeats). During catheter ablation, a long, thin, flexible tube is put into a blood vessel in your groin (upper thigh), or neck. This tube is called an ablation catheter. It is then guided to your heart through the blood vessel. Radio frequency waves destroy small areas of heart tissue where abnormal heartbeats may cause an arrhythmia to start. Please see the instruction sheet given to you today.   Follow-Up: At [facility], you and your health needs are our priority.  As part of our continuing mission to provide you with exceptional heart care, we have created designated Provider Care Teams.  These Care Teams include your primary Cardiologist (physician) and Advanced Practice Providers (APPs -  Physician Assistants and Nurse Practitioners) who all work together to provide you with the care you need, when you need it.  Your next appointment:   DETERMINED AFTER ABLATION    {Other Instructions

## 2019-03-25 LAB — CBC
Hematocrit: 36.5 % (ref 34.0–46.6)
Hemoglobin: 12.2 g/dL (ref 11.1–15.9)
MCH: 29.5 pg (ref 26.6–33.0)
MCHC: 33.4 g/dL (ref 31.5–35.7)
MCV: 88 fL (ref 79–97)
Platelets: 381 10*3/uL (ref 150–450)
RBC: 4.13 x10E6/uL (ref 3.77–5.28)
RDW: 14 % (ref 11.7–15.4)
WBC: 10.9 10*3/uL — ABNORMAL HIGH (ref 3.4–10.8)

## 2019-03-25 LAB — BASIC METABOLIC PANEL
BUN/Creatinine Ratio: 19 (ref 12–28)
BUN: 17 mg/dL (ref 8–27)
CO2: 27 mmol/L (ref 20–29)
Calcium: 9.8 mg/dL (ref 8.7–10.3)
Chloride: 96 mmol/L (ref 96–106)
Creatinine, Ser: 0.88 mg/dL (ref 0.57–1.00)
GFR calc Af Amer: 76 mL/min/{1.73_m2} (ref >59)
GFR calc non Af Amer: 66 mL/min/{1.73_m2} (ref >59)
Glucose: 238 mg/dL — ABNORMAL HIGH (ref 65–99)
Potassium: 4 mmol/L (ref 3.5–5.2)
Sodium: 137 mmol/L (ref 134–144)

## 2019-03-26 LAB — NOVEL CORONAVIRUS, NAA (HOSP ORDER, SEND-OUT TO REF LAB; TAT 18-24 HRS): SARS-CoV-2, NAA: NOT DETECTED

## 2019-03-28 ENCOUNTER — Ambulatory Visit (HOSPITAL_COMMUNITY)
Admission: RE | Admit: 2019-03-28 | Discharge: 2019-03-28 | Disposition: A | Discharge status: Home / Self Care | Payer: Medicare Other | Source: Ambulatory Visit | Attending: Internal Medicine | Admitting: Internal Medicine

## 2019-03-28 ENCOUNTER — Encounter (HOSPITAL_COMMUNITY)
Admission: RE | Disposition: A | Discharge status: Home / Self Care | Payer: Self-pay | Source: Ambulatory Visit | Attending: Internal Medicine

## 2019-03-28 ENCOUNTER — Other Ambulatory Visit: Payer: Self-pay

## 2019-03-28 DIAGNOSIS — I110 Hypertensive heart disease with heart failure: Secondary | ICD-10-CM | POA: Diagnosis not present

## 2019-03-28 DIAGNOSIS — I509 Heart failure, unspecified: Secondary | ICD-10-CM | POA: Insufficient documentation

## 2019-03-28 DIAGNOSIS — I442 Atrioventricular block, complete: Secondary | ICD-10-CM | POA: Diagnosis not present

## 2019-03-28 DIAGNOSIS — E782 Mixed hyperlipidemia: Secondary | ICD-10-CM | POA: Diagnosis not present

## 2019-03-28 DIAGNOSIS — I4892 Unspecified atrial flutter: Secondary | ICD-10-CM | POA: Insufficient documentation

## 2019-03-28 DIAGNOSIS — G4733 Obstructive sleep apnea (adult) (pediatric): Secondary | ICD-10-CM | POA: Insufficient documentation

## 2019-03-28 DIAGNOSIS — Z8673 Personal history of transient ischemic attack (TIA), and cerebral infarction without residual deficits: Secondary | ICD-10-CM | POA: Insufficient documentation

## 2019-03-28 DIAGNOSIS — Z951 Presence of aortocoronary bypass graft: Secondary | ICD-10-CM | POA: Insufficient documentation

## 2019-03-28 DIAGNOSIS — I4811 Longstanding persistent atrial fibrillation: Secondary | ICD-10-CM | POA: Insufficient documentation

## 2019-03-28 DIAGNOSIS — I2510 Atherosclerotic heart disease of native coronary artery without angina pectoris: Secondary | ICD-10-CM | POA: Diagnosis not present

## 2019-03-28 DIAGNOSIS — I4819 Other persistent atrial fibrillation: Secondary | ICD-10-CM | POA: Diagnosis not present

## 2019-03-28 DIAGNOSIS — E119 Type 2 diabetes mellitus without complications: Secondary | ICD-10-CM | POA: Insufficient documentation

## 2019-03-28 HISTORY — PX: AV NODE ABLATION: EP1193

## 2019-03-28 LAB — GLUCOSE, CAPILLARY
Glucose-Capillary: 239 mg/dL — ABNORMAL HIGH (ref 70–99)
Glucose-Capillary: 234 mg/dL — ABNORMAL HIGH (ref 70–99)

## 2019-03-28 SURGERY — AV NODE ABLATION

## 2019-03-28 MED ORDER — BUPIVACAINE HCL (PF) 0.25 % IJ SOLN
INTRAMUSCULAR | Status: AC
  Filled 2019-03-28: qty 30

## 2019-03-28 MED ORDER — FENTANYL CITRATE (PF) 100 MCG/2ML IJ SOLN
INTRAMUSCULAR | Status: AC
  Filled 2019-03-28: qty 2

## 2019-03-28 MED ORDER — SODIUM CHLORIDE 0.9% FLUSH: 3.0000 mL | Freq: Two times a day (BID) | INTRAVENOUS | Status: DC

## 2019-03-28 MED ORDER — HEPARIN (PORCINE) IN NACL 1000-0.9 UT/500ML-% IV SOLN
INTRAVENOUS | Status: DC | PRN
  Administered 2019-03-28: 500 mL

## 2019-03-28 MED ORDER — SODIUM CHLORIDE 0.9 % IV SOLN: 250.0000 mL | INTRAVENOUS | Status: DC | PRN

## 2019-03-28 MED ORDER — SODIUM CHLORIDE 0.9 % IV SOLN: INTRAVENOUS | Status: DC

## 2019-03-28 MED ORDER — HEPARIN (PORCINE) IN NACL 1000-0.9 UT/500ML-% IV SOLN
INTRAVENOUS | Status: AC
  Filled 2019-03-28: qty 500

## 2019-03-28 MED ORDER — MIDAZOLAM HCL 5 MG/5ML IJ SOLN
INTRAMUSCULAR | Status: DC | PRN
  Administered 2019-03-28: 2 mg via INTRAVENOUS

## 2019-03-28 MED ORDER — ACETAMINOPHEN 325 MG PO TABS
650.0000 mg | ORAL_TABLET | ORAL | Status: DC | PRN
  Administered 2019-03-28: 650 mg via ORAL
  Filled 2019-03-28: qty 2

## 2019-03-28 MED ORDER — SODIUM CHLORIDE 0.9% FLUSH: 3.0000 mL | INTRAVENOUS | Status: DC | PRN

## 2019-03-28 MED ORDER — FENTANYL CITRATE (PF) 100 MCG/2ML IJ SOLN
INTRAMUSCULAR | Status: DC | PRN
  Administered 2019-03-28: 25 ug via INTRAVENOUS

## 2019-03-28 MED ORDER — ONDANSETRON HCL 4 MG/2ML IJ SOLN: 4.0000 mg | Freq: Four times a day (QID) | INTRAMUSCULAR | Status: DC | PRN

## 2019-03-28 MED ORDER — BUPIVACAINE HCL (PF) 0.25 % IJ SOLN
INTRAMUSCULAR | Status: DC | PRN
  Administered 2019-03-28: 20 mL

## 2019-03-28 MED ORDER — MIDAZOLAM HCL 5 MG/5ML IJ SOLN
INTRAMUSCULAR | Status: AC
  Filled 2019-03-28: qty 5

## 2019-03-28 SURGICAL SUPPLY — 5 items
CATH CELSIUS THERMO F CV 7FR (ABLATOR) ×3 IMPLANT
PACK EP LATEX FREE (CUSTOM PROCEDURE TRAY) ×2
PACK EP LF (CUSTOM PROCEDURE TRAY) ×1 IMPLANT
PAD PRO RADIOLUCENT 2001M-C (PAD) ×3 IMPLANT
SHEATH PINNACLE 8F 10CM (SHEATH) ×3 IMPLANT

## 2019-03-28 NOTE — Progress Notes (Signed)
Dr. [name] has seen the patient post procedure, cleared for discharge after 1600, ambulated and stable Post procedure teaching is completed with the patient Follow up is in place Outside of her known back pain she feels well, no CP or SOB. VSS  Tommye Standard, PA-C

## 2019-03-28 NOTE — Discharge Instructions (Signed)
Cardiac Ablation, Care After This sheet gives you information about how to care for yourself after your procedure. Your health care provider may also give you more specific instructions. If you have problems or questions, contact your health care provider. What can I expect after the procedure? After the procedure, it is common to have:  Bruising around your puncture site.  Tenderness around your puncture site.  Skipped heartbeats.  Tiredness (fatigue). Follow these instructions at home: Puncture site care   Follow instructions from your health care provider about how to take care of your puncture site. Make sure you: ? Wash your hands with soap and water before you change your bandage (dressing). If soap and water are not available, use hand sanitizer. ? Change your dressing as told by your health care provider. ? Leave stitches (sutures), skin glue, or adhesive strips in place. These skin closures may need to stay in place for up to 2 weeks. If adhesive strip edges start to loosen and curl up, you may trim the loose edges. Do not remove adhesive strips completely unless your health care provider tells you to do that.  Check your puncture site every day for signs of infection. Check for: ? Redness, swelling, or pain. ? Fluid or blood. If your puncture site starts to bleed, lie down on your back, apply firm pressure to the area, and contact your health care provider. ? Warmth. ? Pus or a bad smell. Driving  Ask your health care provider when it is safe for you to drive again after the procedure.  Do not drive or use heavy machinery while taking prescription pain medicine.  Do not drive for 24 hours if you were given a medicine to help you relax (sedative) during your procedure. Activity  Avoid activities that take a lot of effort for at least 3 days after your procedure.  Do not lift anything that is heavier than 10 lb (4.5 kg), or the limit that you are told, until your health  care provider says that it is safe.  Return to your normal activities as told by your health care provider. Ask your health care provider what activities are safe for you. General instructions  Take over-the-counter and prescription medicines only as told by your health care provider.  Do not use any products that contain nicotine or tobacco, such as cigarettes and e-cigarettes. If you need help quitting, ask your health care provider.  Do not take baths, swim, or use a hot tub until your health care provider approves.  Do not drink alcohol for 24 hours after your procedure.  Keep all follow-up visits as told by your health care provider. This is important. Contact a health care provider if:  You have redness, mild swelling, or pain around your puncture site.  You have fluid or blood coming from your puncture site that stops after applying firm pressure to the area.  Your puncture site feels warm to the touch.  You have pus or a bad smell coming from your puncture site.  You have a fever.  You have chest pain or discomfort that spreads to your neck, jaw, or arm.  You are sweating a lot.  You feel nauseous.  You have a fast or irregular heartbeat.  You have shortness of breath.  You are dizzy or light-headed and feel the need to lie down.  You have pain or numbness in the arm or leg closest to your puncture site. Get help right away if:  Your puncture  site suddenly swells.  Your puncture site is bleeding and the bleeding does not stop after applying firm pressure to the area. These symptoms may represent a serious problem that is an emergency. Do not wait to see if the symptoms will go away. Get medical help right away. Call your local emergency services (911 in the [place] Do not drive yourself to the hospital. Summary  After the procedure, it is normal to have bruising and tenderness at the puncture site in your groin, neck, or forearm.  Check your puncture site every  day for signs of infection.  Get help right away if your puncture site is bleeding and the bleeding does not stop after applying firm pressure to the area. This is a medical emergency. This information is not intended to replace advice given to you by your health care provider. Make sure you discuss any questions you have with your health care provider. Document Revised: 02/13/2017 Document Reviewed: 06/12/2016 Elsevier Patient Education  [place] procedure care instructions   No driving for 4 days. No lifting over 5 lbs for 1 week. No vigorous or sexual activity for 1 week. You may return to work/your usual activities on 04/03/2018. Keep procedure site clean & dry. If you notice increased pain, swelling, bleeding or pus, call/return!  You may shower, but no soaking baths/hot tubs/pools for 1 week.

## 2019-03-28 NOTE — Progress Notes (Signed)
Site area: Right groin a  8 french venous sheath was removed  Site Prior to Removal:  Level 0  Pressure Applied For 20 MINUTES    Bedrest Beginning at 0930am  Manual:   Yes.    Patient Status During Pull:  stable  Post Pull Groin Site:  Level 0  Post Pull Instructions Given:  Yes.    Post Pull Pulses Present:  Yes.    Dressing Applied:  Yes.    Comments:

## 2019-03-28 NOTE — Progress Notes (Signed)
Discharge instructions with pt and her daughter (via telephone) both voice understanding.

## 2019-03-28 NOTE — H&P (Signed)
Related encounter: Office Visit from 03/24/2019 in [place] buttonCollapse widget button    Show:Clear all   ManualTemplateCopied  Added by:     Baldwin Jamaica, PA-C   Hover for detailscustomization button                                                                                                                                             untitled image      Cardiology Office Note  Date:  03/24/2019   Patient ID:  Dana, Paul 05/27/1945, MRN [id number]  PCP:  Wannetta Sender, FNP    Cardiologist:  Dr. Claiborne Billings  Electrophysiologist: Dr. [name]        Chief Complaint: follow up recommended by Dr. Claiborne Billings     History of Present Illness:  [name] is a 74 y.o. female with history of HTN, HLD, TIA, DM, Afib, CAD (CABG and MAZE 2005), post termination pauses >> PPM, ILD, OSA w/CPAP.     She comes in today to be seen for Dr. [name].  Last seen by him march 2020, at that time doing well, was several weeks off the amiodarone noting the pt reported almost immediately feeling better, her cough, , SOB, and her edema all reported as better, she was in AFib, planned to f/u in 4 mo to revisit rhythm.     She was hospitalized [facility] with progressive SOB, treated for acute/chronic CHF, Afib w/RVR treated with dilt gtt transitioned to PO and added to her regime at d/c, pulmonary followed as well, planned for booadened evaluation into ILD and out patient follow up, discharged 12/13/2018.     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  Hypersensitive pneumonitis is particularly concerning because of the  significant air trapping.  The pt apparently with h/o being bird Conservation officer, historic buildings, Sales executive breeder, Firefighter, Engineer, manufacturing systems, Museum/gallery exhibitions officer, amiodarone  She was having difficulty accepting her lung disease likely 2/2 bird exposures.  She had stable DOE (hills, stairs, or mod exertion), was not interested in pursuing broncoscopy, biopsy, of genomic evaluation, wanted to avoid steroid therapy. PFTs stable.  Her HR was noted elevated (130's) and recommended to f/u with cardiology     She saw Dr. Claiborne Billings 03/02/2019, she presented with HR 80's, and settled to 60's, she was on high dose rate limiting meds already, no changes were made with recommendation to f/u with EP.     She is suprised to hear her HR is fast and out of rhythm, has no sense of palpitations or cardiac awareness.  She has  some baseline DOE that she says is unchanged, able to do her usual ADL:s, gets winded with more exertional activities like steps, fast paced walking.  Denies any CP, no near syncope or syncope.  In general she feels like all-in-all she likely feels a bit better overall then she did 3 months ago.  She does though think she is on too much diltiazem, and thinks this is making it hard for her to breath at night.  2 nights ago she was unable to breath in bed, had to sit up, and that night slept minimally if at all.  She was having some breathless ness at night leading up to 2 nights ago, but since has been a little more comfortable and self reduced the dilt to one capsule daily        Device information  MDT dual chamber PPM, implanted 06/05/2014  AFib hx  MAZE 2005  AAD   Amiodarone stopped Oct 2019 2/2 concerns of pulmonary tox             Past Medical History:    Diagnosis   Date    .   Asthmatic bronchitis        .   CHF (congestive heart failure) ([place])   dx'd 2016    .   Coronary atherosclerosis of native coronary artery            a. s/p CABG 2005 Dr Roxy Manns      .    Hyperlipidemia        .   Hypertension        .   Hypertensive heart disease        .   Kidney stones        .   Mixed hyperlipidemia        .   Nephrolithiasis   06/07/2015    .   Paroxysmal atrial fibrillation ([place])            a. s/p Maze in 2005 b. recurrence in March 2016 with >8sec posttermination pauses s/p PPM implant      .   Pneumonia   1950        "double"    .   Presence of permanent cardiac pacemaker        .   Restrictive lung disease   03/03/2018    .   Sleep apnea            "tested; mask ordered; couldn't afford it; will get it now" (09/04/2015)    .   TIA (transient ischemic attack)        .   Type II diabetes mellitus ([place])        .   Uterine cancer [facility])            age 41 with partial hysterectomy                Past Surgical History:    Procedure   Laterality   Date    .   CARDIAC CATHETERIZATION       2005    .   CORONARY ARTERY BYPASS GRAFT       2005        Dr Roxy Manns with MAZE    .   CYSTOSCOPY WITH URETEROSCOPY, STONE BASKETRY AND STENT PLACEMENT   Left   09/08/2014        Procedure: CYSTOSCOPY WITH URETEROSCOPY, STONE BASKETRY AND STENT PLACEMENT;  Surgeon: Cleon Gustin, MD;  Location: [facility];  Service: Urology;  Laterality: Left;    .   CYSTOSCOPY/URETEROSCOPY/HOLMIUM LASER/STENT PLACEMENT   Left   09/15/2014        Procedure: CYSTOSCOPY/RETROGRADE/URETEROSCOPY/HOLMIUM LASER/ STONE EXTRACTION /STENT EXCHANGE;  Surgeon: Cleon Gustin, MD;  Location: [facility];  Service: Urology;  Laterality: Left;    .   HOLMIUM LASER APPLICATION   Left   0/37/9444        Procedure: HOLMIUM LASER APPLICATION;  Surgeon: Cleon Gustin, MD;  Location: [facility];  Service: Urology;  Laterality: Left;    .   HOLMIUM LASER APPLICATION   Left   08/16/9010        Procedure: HOLMIUM LASER APPLICATION;   Surgeon: Cleon Gustin, MD;  Location: [facility];  Service: Urology;  Laterality: Left;    .   INSERT / REPLACE / REMOVE PACEMAKER            .   LAPAROSCOPIC CHOLECYSTECTOMY            .   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   VAGINAL HYSTERECTOMY       1975                 Current Outpatient Medications    Medication   Sig   Dispense   Refill    .   acetaminophen (TYLENOL) 500 MG tablet   Take 500-1,000 mg by mouth every 6 (six) hours as needed for moderate pain.            [name]   apixaban (ELIQUIS) 5 MG TABS tablet   Take 1 tablet (5 mg total) by mouth 2 (two) times daily. Take 1st dose 06-07-14   60 tablet   1    .   aspirin 81 MG tablet   Take 81 mg by mouth daily.             .   B Complex-Biotin-FA (B-COMPLEX PO)   Take 1 capsule by mouth every morning.             [name]   BIOTIN PO   Take 1 tablet by mouth daily.            .   Cholecalciferol (VITAMIN D3) 1000 UNITS CAPS   Take 1,000 Units by mouth daily.            .   Coenzyme Q10 (CO Q-10) 200 MG CAPS   Take 200 mg by mouth daily.            [name]   diltiazem (CARDIZEM CD) 240 MG 24 hr capsule   Take 2 capsules (480 mg total) by mouth daily.   30 capsule   2    .   diphenhydramine-acetaminophen (TYLENOL PM) 25-500 MG TABS tablet   Take 1 tablet by mouth at bedtime as needed (sleep).            .   ezetimibe (ZETIA) 10 MG tablet   Take 10 mg by mouth daily.            .   fenofibrate 160 MG tablet   TAKE 1 TABLET DAILY   90 tablet   0    .   furosemide (LASIX) 80 MG tablet   Take 80 mg by mouth daily. Take 80  in the am and 40 in pm             .   losartan (COZAAR) 50 MG tablet   TAKE 1 TABLET UPTO TWICE A DAY AS DIRECTED (Patient taking differently: Take 50 mg by  mouth daily. )   180 tablet   2    .   metFORMIN (GLUCOPHAGE) 500 MG tablet   Take 500 mg by mouth 2 (two) times daily with a meal.             .   metoprolol succinate (TOPROL-XL) 100 MG 24 hr tablet   Take 1 tablet daily with or immediatelyfollowing a meal.   90 tablet   3    .   Omega-3 Fatty Acids (FISH OIL PO)   Take 2 capsules by mouth 2 (two) times daily.            .   potassium chloride (K-DUR,KLOR-CON) 10 MEQ tablet   Take 10 mEq by mouth every morning.             .   potassium chloride (KLOR-CON) 10 MEQ tablet   Take 20 mEq by mouth daily.            .   Semaglutide (OZEMPIC, 0.25 OR 0.5 MG/DOSE, [place])   Inject 0.5 mg into the skin once a week.            .   Semaglutide (RYBELSUS) 3 MG TABS   Take by mouth.            .   TRESIBA FLEXTOUCH 100 UNIT/ML SOPN FlexTouch Pen   56 Units.                 No current facility-administered medications for this visit.          Allergies:   Patient has no known allergies.      Social History:  The patient  reports that she has never smoked. She has never used smokeless tobacco. She reports that she does not drink alcohol or use drugs.      Family History:  The patient's family history includes Arthritis in her brother; Diabetes in her father; Heart attack in her maternal grandfather; Heart disease in her father and mother; Hyperlipidemia in her mother; Hypertension in her daughter, mother, and sister; Stroke in her father and paternal grandfather.     ROS:  Please see the history of present illness.   All other systems are reviewed and otherwise negative.      PHYSICAL EXAM:   VS:  BP 140/84   Pulse (!) 135   Ht 5' 3.5" (1.613 m)   Wt 226 lb (102.5 kg)   SpO2 98%   BMI 39.41 kg/m  BMI: Body mass index is 39.41 kg/m.  Well nourished, well developed, in no acute distress   HEENT: normocephalic, atraumatic   Neck: no JVD, carotid  bruits or masses  Cardiac:  RRR, tachycardic; no significant murmurs, no rubs, or gallops  Lungs:  CTA b/l, no wheezing, rhonchi or rales   Abd: soft, nontender, obese  MS: no deformity or atrophy  Ext: trace edema only  Skin: warm and dry, no rash  Neuro:  No gross deficits appreciated  Psych: euthymic mood, full affect     PPM site is stable, no tethering or discomfort        EKG:  AFlutter 135bpm, faster, otherwise similar to prior     PPM interrogation done  today and reviewed by myself:   Battery , lead impedances, sensing are good  Unable to test thresholds V rate too fast, and in an Aflutter  HR histogram with majorty of her HR 120's-130's        12/09/2018: TTE  IMPRESSIONS  1. Left ventricular ejection fraction, by visual estimation, is 55%. The left ventricle has normal function. Normal left ventricular size. There is mildly increased left ventricular hypertrophy.   2. Definity contrast agent was given IV to delineate the left ventricular endocardial borders.   3. Left ventricular diastolic Doppler parameters are indeterminate pattern of LV diastolic filling.   4. The RV was poorly visualized. Global right ventricle has mildly reduced systolic function.The right ventricular size is mildly enlarged. No increase in right ventricular wall thickness. D-shaped interventricular septum suggestive of RV   pressure/volume overload.   5. Moderate mitral annular calcification.   6. The mitral valve is degenerative. No evidence of mitral valve regurgitation. No evidence of mitral stenosis.   7. The tricuspid valve is normal in structure. Tricuspid valve regurgitation is trivial.   8. The aortic valve is tricuspid Aortic valve regurgitation is trivial by color flow Doppler. Mild to moderate aortic valve sclerosis/calcification without any evidence of aortic stenosis.   9. There is mild dilatation of the ascending aorta measuring 37 mm.  10. A pacer wire is  visualized.  11. Left atrial size was mildly dilated.  12. The inferior vena cava is dilated in size with <50% respiratory variability, suggesting right atrial pressure of 15 mmHg.  13. The tricuspid regurgitant velocity is 2.73 m/s, and with an assumed right atrial pressure of 15 mmHg, the estimated right ventricular systolic pressure is moderately elevated at 44.8 mmHg.  14. Right atrial size was not well visualized.           10/07/2012: stress myoview  Impression  Exercise Capacity:  Fair exercise capacity.  BP Response:  Normal blood pressure response.  Clinical Symptoms:  No significant symptoms noted.  ECG Impression:  No significant ST segment change suggestive of ischemia.  Comparison with Prior Nuclear Study: No images to compare     Overall Impression:  Low risk stress nuclear study Breast attenuation artifact.     LV Wall Motion:  NL LV Function; NL Wall Motion           Recent Labs:  12/08/2018: B Natriuretic Peptide 110.5  12/10/2018: Magnesium 1.6  12/11/2018: ALT 24  12/12/2018: Hemoglobin 11.4; Platelets 285  12/13/2018: BUN 17; Creatinine, Ser 0.92; Potassium 3.7; Sodium 136   No results found for requested labs within last 8760 hours.      CrCl cannot be calculated (Patient's most recent lab result is older than the maximum 21 days allowed.).          Wt Readings from Last 3 Encounters:    03/24/19   226 lb (102.5 kg)    03/02/19   220 lb (99.8 kg)    03/01/19   225 lb (102.1 kg)          Other studies reviewed:  Additional studies/records reviewed today include: summarized above     ASSESSMENT AND PLAN:        1. PPM      No programming changes made     2. Longstanding persistent Afib      CHA2DS2Vasc is 7, on eliquis, appropriately dosed      RVR despite aggressive rate control attempts     Dr. [name] saw  the patient today as well.  Discussed rate control strategies have failed, she can  not have amiodarone, and her QT is too long at baseline to try Tikosyn  Recommended AV node ablation, discussed the procedure, potential risks, benefits, and rational for it  She was hesitant though after lengthy discussion and concerns of eventually ending up with a CM and worsened CHF she is agreeable     She is added to his case Monday  She was given instructions to hold Eliquis the day prior and morning of her procedure     3. CAD      No anginal symptoms      Continue with Dr. Claiborne Billings     4. HTN      Tolerating current meds     5. Volume OL      Her exam today does not suggest overt volume OL, though she describes significant orthopnea a couple nights ago.      Given her RVR and unable to rate control, will have her take her 33m lasic AM and a 423mat night, and increase her Kdur to 2092mdaily       Labs today, pending these, may adjust         Disposition: F/u with AVNode ablation Monday routine post procedure follow up.  Current medicines are reviewed at length with the patient today.  The patient did not have any concerns regarding medicines.   Signed,  Tommye StandardA-C  03/24/2019 1:21 PM     EP Attending  Patient seen and examined. Please see my note from the hospital as well. The patient has uncontrolled atrial fib/flutter. I have discussed the treatment options and the risks/benefits/goals/expectations of AV node ablation were reviewed and she wishes to proceed.   GreMikle Bosworth

## 2019-04-11 NOTE — Progress Notes (Signed)
Cardiology Office Note Date:  04/11/2019  Patient ID:  [name], [name] 1945/06/08, MRN [id number] PCP:  Wannetta Sender, FNP  Cardiologist:  Dr. Claiborne Billings Electrophysiologist: Dr. [name]    Chief Complaint:  s/p AV node ablation  History of Present Illness: [name] is a 74 y.o. female with history of HTN, HLD, TIA, DM, Afib, CAD (CABG and MAZE 2005), post termination pauses >> PPM, ILD, OSA w/CPAP.  She comes in today to be seen for Dr. [name].  Last seen by him march 2020, at that time doing well, was several weeks off the amiodarone noting the pt reported almost immediately feeling better, her cough, , SOB, and her edema all reported as better, she was in AFib, planned to f/u in 4 mo to revisit rhythm.  She was hospitalized [facility] with progressive SOB, treated for acute/chronic CHF, Afib w/RVR treated with dilt gtt transitioned to PO and added to her regime at d/c, pulmonary followed as well, planned for booadened evaluation into ILD and out patient follow up, discharged 12/13/2018.  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  The pt apparently with h/o being bird Conservation officer, historic buildings, Sales executive breeder, Firefighter, Engineer, manufacturing systems, Museum/gallery exhibitions officer, amiodarone  She was having difficulty accepting her lung disease likely 2/2 bird exposures. She had stable DOE (hills, stairs, or mod exertion), was not interested in pursuing broncoscopy, biopsy, of genomic evaluation, wanted to avoid steroid therapy. PFTs stable.  Her HR was noted elevated (130's) and recommended to f/u with cardiology  She saw Dr. Claiborne Billings 03/02/2019, she presented with HR 80's, and settled to 60's, she was on high dose rate limiting meds already, no changes were made with recommendation to f/u with EP.  I saw her  03/24/2019 she remained in Afib w/RVR despite aggressive medical management with rate controlling drugs, Dr. [name] also saw her and planned for AVNode ablation that was done 03/28/2019, her dilt was stopped post procedure.  She is is accompanied by her daughter, stating she tends to get a little confused with details and instructions.  she is doing OK.  She forgot her lasix last night and this Am has not yet taken any of her medicines (usually waits until after breakfast), and can tell her ankles a little swollen, slight winded with exertion this AM.  She has felt generally better though since her AV node ablation and very grateful to be off the diltiazem.  She denies ant groin/procedure site concerns or complications.  She is back on her Eliquis without bleeding or signs of bleeding Denies any CP, no rest SOB, no dizzy spells, near syncope or syncope.    Device information MDT dual chamber PPM, implanted 06/05/2014  AFib hx MAZE 2005 AV node ablation jan 2021 AAD  Amiodarone stopped Oct 2019 2/2 concerns of pulmonary tox   Past Medical History:  Diagnosis Date  . Asthmatic bronchitis   . CHF (congestive heart failure) ([place]) dx'd 2016  . Coronary atherosclerosis of native coronary artery    a. s/p CABG 2005 Dr Roxy Manns    . Hyperlipidemia   . Hypertension   . Hypertensive heart disease   . Kidney stones   . Mixed hyperlipidemia   . Nephrolithiasis 06/07/2015  . Paroxysmal atrial fibrillation ([place])    a. s/p Maze in 2005 b. recurrence in March 2016 with >8sec posttermination  pauses s/p PPM implant    . Pneumonia 1950   "double"  . Presence of permanent cardiac pacemaker   . Restrictive lung disease 03/03/2018  . Sleep apnea    "tested; mask ordered; couldn't afford it; will get it now" (09/04/2015)  . TIA (transient ischemic attack)   . Type II diabetes mellitus ([place])   . Uterine cancer [facility])    age 50 with partial hysterectomy    Past Surgical History:  Procedure Laterality Date    . AV NODE ABLATION N/A 03/28/2019   Procedure: AV NODE ABLATION;  Surgeon: Evans Lance, MD;  Location: [place] [facility];  Service: Cardiovascular;  Laterality: N/A;  . CARDIAC CATHETERIZATION  2005  . CORONARY ARTERY BYPASS GRAFT  2005   Dr Roxy Manns with MAZE  . CYSTOSCOPY WITH URETEROSCOPY, STONE BASKETRY AND STENT PLACEMENT Left 09/08/2014   Procedure: CYSTOSCOPY WITH URETEROSCOPY, STONE BASKETRY AND STENT PLACEMENT;  Surgeon: Cleon Gustin, MD;  [facility];  Service: Urology;  Laterality: Left;  . CYSTOSCOPY/URETEROSCOPY/HOLMIUM LASER/STENT PLACEMENT Left 09/15/2014   Procedure: CYSTOSCOPY/RETROGRADE/URETEROSCOPY/HOLMIUM LASER/ STONE EXTRACTION /STENT EXCHANGE;  Surgeon: Cleon Gustin, MD;  [facility];  Service: Urology;  Laterality: Left;  . HOLMIUM LASER APPLICATION Left 06/23/8117   Procedure: HOLMIUM LASER APPLICATION;  Surgeon: Cleon Gustin, MD;  [facility];  Service: Urology;  Laterality: Left;  . HOLMIUM LASER APPLICATION Left 03/21/7827   Procedure: HOLMIUM LASER APPLICATION;  Surgeon: Cleon Gustin, MD;  [facility];  Service: Urology;  Laterality: Left;  . INSERT / REPLACE / REMOVE PACEMAKER    . LAPAROSCOPIC CHOLECYSTECTOMY    . 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 VAGINAL HYSTERECTOMY  1975    Current Outpatient Medications  Medication Sig Dispense Refill  . acetaminophen (TYLENOL) 500 MG tablet Take 500-1,000 mg by mouth every 6 (six) hours as needed for moderate pain.    [name] apixaban (ELIQUIS) 5 MG TABS tablet Take 1 tablet (5 mg total) by mouth 2 (two) times daily. Take 1st dose 06-07-14 (Patient taking differently: Take 5 mg by mouth 2 (two) times daily. ) 60 tablet 1  . aspirin 81 MG tablet Take 81 mg by mouth daily.     . B Complex-Biotin-FA (B-COMPLEX PO) Take 1 capsule by mouth every morning.     . Cholecalciferol (VITAMIN D3) 1000 UNITS  CAPS Take 1,000 Units by mouth daily.    . Coenzyme Q10 (CO Q-10) 200 MG CAPS Take 200 mg by mouth daily.    . diphenhydramine-acetaminophen (TYLENOL PM) 25-500 MG TABS tablet Take 1 tablet by mouth at bedtime as needed (pain/sleep).     . ezetimibe (ZETIA) 10 MG tablet Take 10 mg by mouth daily.    . fenofibrate 160 MG tablet TAKE 1 TABLET DAILY (Patient taking differently: Take 160 mg by mouth daily. ) 90 tablet 0  . furosemide (LASIX) 80 MG tablet Take 20-80 mg by mouth See admin instructions. Take 80 mg by mouth in the morning and 20 mg in the evening    . losartan (COZAAR) 50 MG tablet TAKE 1 TABLET UPTO TWICE A DAY AS DIRECTED (Patient taking differently: Take 50 mg by mouth daily. ) 180 tablet 2  . metFORMIN (GLUCOPHAGE) 500 MG tablet Take 500 mg by mouth 2 (two) times daily with a meal.     . metoprolol succinate (TOPROL-XL) 100 MG 24 hr tablet Take  1 tablet daily with or immediatelyfollowing a meal. (Patient taking differently: Take 100 mg by mouth daily. Take 1 tablet daily with or immediatelyfollowing a meal.) 90 tablet 3  . potassium chloride (KLOR-CON) 10 MEQ tablet Take 20 mEq by mouth daily.    . Semaglutide (OZEMPIC, 0.25 OR 0.5 MG/DOSE, [place]) Inject 1 mg into the skin once a week.     . TRESIBA FLEXTOUCH 100 UNIT/ML SOPN FlexTouch Pen Inject 28 Units into the skin 2 (two) times daily.      No current facility-administered medications for this visit.    Allergies:   Patient has no known allergies.   Social History:  The patient  reports that she has never smoked. She has never used smokeless tobacco. She reports that she does not drink alcohol or use drugs.   Family History:  The patient's family history includes Arthritis in her brother; Diabetes in her father; Heart attack in her maternal grandfather; Heart disease in her father and mother; Hyperlipidemia in her mother; Hypertension in her daughter, mother, and sister; Stroke in her father and paternal grandfather.  ROS:  Please  see the history of present illness.  All other systems are reviewed and otherwise negative.   PHYSICAL EXAM:  VS:  There were no vitals taken for this visit. BMI: There is no height or weight on file to calculate BMI. Well nourished, well developed, in no acute distress  HEENT: normocephalic, atraumatic  Neck: no JVD, carotid bruits or masses Cardiac:  RRR,(paced); no significant murmurs, no rubs, or gallops Lungs:  CTA b/l, no wheezing, rhonchi or rales  Abd: soft, nontender, obese MS: no deformity or atrophy Ext: trace edema only, r groin is soft, nontender, healed well Skin: warm and dry, no rash Neuro:  No gross deficits appreciated Psych: euthymic mood, full affect  PPM site is stable, no tethering or discomfort   EKG:  Not done today  PPM interrogation done today and reviewed by myself:  Battery and lead measurements are good No R waves at 40bpm Pacing rate reduced to 80bpm today   12/09/2018: TTE IMPRESSIONS 1. Left ventricular ejection fraction, by visual estimation, is 55%. The left ventricle has normal function. Normal left ventricular size. There is mildly increased left ventricular hypertrophy.  2. Definity contrast agent was given IV to delineate the left ventricular endocardial borders.  3. Left ventricular diastolic Doppler parameters are indeterminate pattern of LV diastolic filling.  4. The RV was poorly visualized. Global right ventricle has mildly reduced systolic function.The right ventricular size is mildly enlarged. No increase in right ventricular wall thickness. D-shaped interventricular septum suggestive of RV  pressure/volume overload.  5. Moderate mitral annular calcification.  6. The mitral valve is degenerative. No evidence of mitral valve regurgitation. No evidence of mitral stenosis.  7. The tricuspid valve is normal in structure. Tricuspid valve regurgitation is trivial.  8. The aortic valve is tricuspid Aortic valve regurgitation is trivial by  color flow Doppler. Mild to moderate aortic valve sclerosis/calcification without any evidence of aortic stenosis.  9. There is mild dilatation of the ascending aorta measuring 37 mm. 10. A pacer wire is visualized. 11. Left atrial size was mildly dilated. 12. The inferior vena cava is dilated in size with <50% respiratory variability, suggesting right atrial pressure of 15 mmHg. 13. The tricuspid regurgitant velocity is 2.73 m/s, and with an assumed right atrial pressure of 15 mmHg, the estimated right ventricular systolic pressure is moderately elevated at 44.8 mmHg. 14. Right atrial size  was not well visualized.    10/07/2012: stress myoview Impression Exercise Capacity:  Fair exercise capacity. BP Response:  Normal blood pressure response. Clinical Symptoms:  No significant symptoms noted. ECG Impression:  No significant ST segment change suggestive of ischemia. Comparison with Prior Nuclear Study: No images to compare  Overall Impression:  Low risk stress nuclear study Breast attenuation artifact.  LV Wall Motion:  NL LV Function; NL Wall Motion    Recent Labs: 12/08/2018: B Natriuretic Peptide 110.5 12/10/2018: Magnesium 1.6 12/11/2018: ALT 24 03/24/2019: BUN 17; Creatinine, Ser 0.88; Hemoglobin 12.2; Platelets 381; Potassium 4.0; Sodium 137  No results found for requested labs within last 8760 hours.   CrCl cannot be calculated (Unknown ideal weight.).   Wt Readings from Last 3 Encounters:  03/28/19 221 lb (100.2 kg)  03/24/19 226 lb (102.5 kg)  03/02/19 220 lb (99.8 kg)     Other studies reviewed: Additional studies/records reviewed today include: summarized above  ASSESSMENT AND PLAN:   1. PPM     Pacing rate reduced to 80  2. Longstanding persistent Afib     CHA2DS2Vasc is 7, on eliquis, appropriately dosed     now s/p AV node ablation  She was left DDI post AV node ablation Unclear if Dr. [name] had thoughts of attempting rhythm control again She had  interruption of her Eliquis per-procedure She sees Dr. [name] at her next 2 week check, will leave rhythm control vs rate control and VVI (VVIR) to him at her visit in 2 weeks  3. CAD     No anginal symptoms     Continue with Dr. Claiborne Billings  4. HTN    No meds yet today  5. Volume OL     Not grossly volume OL     She will take her meds including lasix once home     Reminded to minimize sodium   Disposition: as above  Current medicines are reviewed at length with the patient today.  The patient did not have any concerns regarding medicines.  Venetia Night, PA-C 04/11/2019 8:13 AM     [address] [place] [address] [phone] (office)  [phone] (fax)

## 2019-04-12 ENCOUNTER — Telehealth: Payer: Self-pay | Admitting: Physician Assistant

## 2019-04-12 NOTE — Telephone Encounter (Signed)
Dawn, Daughter of the patient wanted to know if she would be allowed to come to the patient's appointment tomorrow. The daughter says the patient has a hard time hearing, and with everyone wearing a mask it is hard for the patient to read lips and understand what is being said.   Please call Dawn and let her know what the office decides. If the office can not reach [place] on her cell phone, she will be at home and the office can try her mobile number.

## 2019-04-13 ENCOUNTER — Ambulatory Visit: Payer: Medicare Other | Admitting: Physician Assistant

## 2019-04-13 ENCOUNTER — Other Ambulatory Visit: Payer: Self-pay

## 2019-04-13 VITALS — BP 140/70 | HR 90 | Ht 64.0 in | Wt 223.0 lb

## 2019-04-13 DIAGNOSIS — Z950 Presence of cardiac pacemaker: Secondary | ICD-10-CM | POA: Diagnosis not present

## 2019-04-13 DIAGNOSIS — I4819 Other persistent atrial fibrillation: Secondary | ICD-10-CM | POA: Diagnosis not present

## 2019-04-13 DIAGNOSIS — Z7901 Long term (current) use of anticoagulants: Secondary | ICD-10-CM | POA: Diagnosis not present

## 2019-04-13 DIAGNOSIS — I10 Essential (primary) hypertension: Secondary | ICD-10-CM

## 2019-04-13 DIAGNOSIS — I2510 Atherosclerotic heart disease of native coronary artery without angina pectoris: Secondary | ICD-10-CM

## 2019-04-13 DIAGNOSIS — I4891 Unspecified atrial fibrillation: Secondary | ICD-10-CM | POA: Diagnosis not present

## 2019-04-13 DIAGNOSIS — I5032 Chronic diastolic (congestive) heart failure: Secondary | ICD-10-CM

## 2019-04-13 NOTE — Patient Instructions (Signed)
Medication Instructions:   Your physician recommends that you continue on your current medications as directed. Please refer to the Current Medication list given to you today.  *If you need a refill on your cardiac medications before your next appointment, please call your pharmacy*  Lab Work: [place]   If you have labs (blood work) drawn today and your tests are completely normal, you will receive your results only by: [name] MyChart Message (if you have MyChart) OR . A paper copy in the mail If you have any lab test that is abnormal or we need to change your treatment, we will call you to review the results.  Testing/Procedures: NONE ORDERED  TODAY   Follow-Up: At [facility], you and your health needs are our priority.  As part of our continuing mission to provide you with exceptional heart care, we have created designated Provider Care Teams.  These Care Teams include your primary Cardiologist (physician) and Advanced Practice Providers (APPs -  Physician Assistants and Nurse Practitioners) who all work together to provide you with the care you need, when you need it.   Your next appointment:  AS SCHEDULED   Other Instructions:

## 2019-04-28 ENCOUNTER — Encounter: Payer: Self-pay | Admitting: Internal Medicine

## 2019-04-28 ENCOUNTER — Other Ambulatory Visit: Payer: Self-pay

## 2019-04-28 ENCOUNTER — Ambulatory Visit (INDEPENDENT_AMBULATORY_CARE_PROVIDER_SITE_OTHER): Payer: Medicare Other | Admitting: Internal Medicine

## 2019-04-28 VITALS — BP 144/78 | HR 80 | Ht 64.0 in | Wt 219.0 lb

## 2019-04-28 DIAGNOSIS — I480 Paroxysmal atrial fibrillation: Secondary | ICD-10-CM

## 2019-04-28 DIAGNOSIS — Z950 Presence of cardiac pacemaker: Secondary | ICD-10-CM | POA: Diagnosis not present

## 2019-04-28 NOTE — Progress Notes (Signed)
HPI [name] returns today for followup after undergoing AV node ablatio one month ago for uncontrolled atrial fib. In the interim, she has done well with her HR well controlled. She denies palpitations/chest pain, or sob. No worsening edema. She has lost over 40 lbs.  No Known Allergies   Current Outpatient Medications  Medication Sig Dispense Refill  . acetaminophen (TYLENOL) 500 MG tablet Take 500-1,000 mg by mouth every 6 (six) hours as needed for moderate pain.    [name] apixaban (ELIQUIS) 5 MG TABS tablet Take 1 tablet (5 mg total) by mouth 2 (two) times daily. Take 1st dose 06-07-14 60 tablet 1  . aspirin 81 MG tablet Take 81 mg by mouth daily.     . B Complex-Biotin-FA (B-COMPLEX PO) Take 1 capsule by mouth every morning.     . Cholecalciferol (VITAMIN D3) 1000 UNITS CAPS Take 1,000 Units by mouth daily.    . Coenzyme Q10 (CO Q-10) 200 MG CAPS Take 200 mg by mouth daily.    . diphenhydramine-acetaminophen (TYLENOL PM) 25-500 MG TABS tablet Take 1 tablet by mouth at bedtime as needed (pain/sleep).     . ezetimibe (ZETIA) 10 MG tablet Take 10 mg by mouth daily.    . fenofibrate 160 MG tablet TAKE 1 TABLET DAILY 90 tablet 0  . furosemide (LASIX) 80 MG tablet Take 20-80 mg by mouth See admin instructions. Take 80 mg by mouth in the morning and 20 mg in the evening    . losartan (COZAAR) 50 MG tablet TAKE 1 TABLET UPTO TWICE A DAY AS DIRECTED 180 tablet 2  . metFORMIN (GLUCOPHAGE) 500 MG tablet Take 500 mg by mouth 2 (two) times daily with a meal.     . metoprolol succinate (TOPROL-XL) 100 MG 24 hr tablet Take 1 tablet daily with or immediatelyfollowing a meal. 90 tablet 3  . potassium chloride (KLOR-CON) 10 MEQ tablet Take 20 mEq by mouth daily.    . Semaglutide (OZEMPIC, 0.25 OR 0.5 MG/DOSE, [place]) Inject 1 mg into the skin once a week.     . TRESIBA FLEXTOUCH 100 UNIT/ML SOPN FlexTouch Pen Inject 28 Units into the skin 2 (two) times daily.      No current facility-administered  medications for this visit.     Past Medical History:  Diagnosis Date  . Asthmatic bronchitis   . CHF (congestive heart failure) ([place]) dx'd 2016  . Coronary atherosclerosis of native coronary artery    a. s/p CABG 2005 Dr Roxy Manns    . Hyperlipidemia   . Hypertension   . Hypertensive heart disease   . Kidney stones   . Mixed hyperlipidemia   . Nephrolithiasis 06/07/2015  . Paroxysmal atrial fibrillation ([place])    a. s/p Maze in 2005 b. recurrence in March 2016 with >8sec posttermination pauses s/p PPM implant    . Pneumonia 1950   "double"  . Presence of permanent cardiac pacemaker   . Restrictive lung disease 03/03/2018  . Sleep apnea    "tested; mask ordered; couldn't afford it; will get it now" (09/04/2015)  . TIA (transient ischemic attack)   . Type II diabetes mellitus ([place])   . Uterine cancer [facility])    age 74 with partial hysterectomy    ROS:   All systems reviewed and negative except as noted in the HPI.   Past Surgical History:  Procedure Laterality Date  . AV NODE ABLATION N/A 03/28/2019   Procedure: AV NODE ABLATION;  Surgeon: Cristopher Peru  W, MD;  Location: [place] [facility];  Service: Cardiovascular;  Laterality: N/A;  . CARDIAC CATHETERIZATION  2005  . CORONARY ARTERY BYPASS GRAFT  2005   Dr Roxy Manns with MAZE  . CYSTOSCOPY WITH URETEROSCOPY, STONE BASKETRY AND STENT PLACEMENT Left 09/08/2014   Procedure: CYSTOSCOPY WITH URETEROSCOPY, STONE BASKETRY AND STENT PLACEMENT;  Surgeon: Cleon Gustin, MD;  [facility];  Service: Urology;  Laterality: Left;  . CYSTOSCOPY/URETEROSCOPY/HOLMIUM LASER/STENT PLACEMENT Left 09/15/2014   Procedure: CYSTOSCOPY/RETROGRADE/URETEROSCOPY/HOLMIUM LASER/ STONE EXTRACTION /STENT EXCHANGE;  Surgeon: Cleon Gustin, MD;  [facility];  Service: Urology;  Laterality: Left;  . HOLMIUM LASER APPLICATION Left 07/02/4079   Procedure: HOLMIUM LASER APPLICATION;  Surgeon: Cleon Gustin, MD;  [facility];  Service: Urology;   Laterality: Left;  . HOLMIUM LASER APPLICATION Left 06/19/8183   Procedure: HOLMIUM LASER APPLICATION;  Surgeon: Cleon Gustin, MD;  [facility];  Service: Urology;  Laterality: Left;  . INSERT / REPLACE / REMOVE PACEMAKER    . LAPAROSCOPIC CHOLECYSTECTOMY    . 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 VAGINAL HYSTERECTOMY  1975     Family History  Problem Relation Age of Onset  . Hypertension Mother   . Hyperlipidemia Mother   . Heart disease Mother   . Diabetes Father   . Stroke Father   . Heart disease Father   . Hypertension Sister        3 sisters  . Arthritis Brother   . Heart attack Maternal Grandfather   . Stroke Paternal Grandfather   . Hypertension Daughter      Social History   Socioeconomic History  . Marital status: Married    Spouse name: Not on file  . Number of children: Not on file  . Years of education: Not on file  . Highest education level: Not on file  Occupational History  . Not on file  Tobacco Use  . Smoking status: Never Smoker  . Smokeless tobacco: Never Used  Substance and Sexual Activity  . Alcohol use: No  . Drug use: No  . Sexual activity: Not on file  Other Topics Concern  . Not on file  Social History Narrative  . Not on file   Social Determinants of Health   Financial Resource Strain: Low Risk   . Difficulty of Paying Living Expenses: Not hard at all  Food Insecurity: Unknown  . Worried About Charity fundraiser in the Last Year: Patient refused  . Ran Out of Food in the Last Year: Patient refused  Transportation Needs: Unknown  . Lack of Transportation (Medical): Patient refused  . Lack of Transportation (Non-Medical): Patient refused  Physical Activity:   . Days of Exercise per Week: Not on file  . Minutes of Exercise per Session: Not on file  Stress:   . Feeling of Stress : Not on file  Social Connections:   . Frequency  of Communication with Friends and Family: Not on file  . Frequency of Social Gatherings with Friends and Family: Not on file  . Attends Religious Services: Not on file  . Active Member of Clubs or Organizations: Not on file  . Attends Archivist Meetings: Not on file  . Marital Status: Not on file  Intimate Partner Violence: Unknown  . Fear of Current or Ex-Partner: Patient refused  . Emotionally Abused: Patient refused  . Physically Abused: Patient refused  .  Sexually Abused: Patient refused     BP (!) 144/78   Pulse 80   Ht _0  (1.626 m)   Wt 219 lb (99.3 kg)   SpO2 97%   BMI 37.59 kg/m   Physical Exam:  Well appearing NAD HEENT: Unremarkable Neck:  No JVD, no thyromegally Lymphatics:  No adenopathy Back:  No CVA tenderness Lungs:  Clear with no wheezes HEART:  Regular rate rhythm, no murmurs, no rubs, no clicks Abd:  soft, positive bowel sounds, no organomegally, no rebound, no guarding Ext:  2 plus pulses, no edema, no cyanosis, no clubbing Skin:  No rashes no nodules Neuro:  CN II through XII intact, motor grossly intact  EKG - atrial fib with ventricular pacing  DEVICE  Normal device function.  See PaceArt for details.   Assess/Plan: 1. Atrial fib - her VR is now well controlled, s/p AV node ablation.  2. PPM - her Medtronic DDD PM is working normally. We will recheck in several months. 3. HTN - her bp is minimally elevated. She is encouraged to reduce her sodium intake. 4. Bronchitis - her symptoms are much improved.  5. Obesity - she has lost 40 lbs and I have encouraged her to lose more. The goal would be to get under 200 lbs.  Mikle [email or web address].D.

## 2019-04-28 NOTE — Patient Instructions (Signed)
Medication Instructions:  Your physician recommends that you continue on your current medications as directed. Please refer to the Current Medication list given to you today.  Labwork: None ordered.  Testing/Procedures: None ordered.  Follow-Up: Your physician wants you to follow-up in: one year with Dr. [name].   You will receive a reminder letter in the mail two months in advance. If you don't receive a letter, please call our office to schedule the follow-up appointment.  Remote monitoring is used to monitor your Pacemaker from home. This monitoring reduces the number of office visits required to check your device to one time per year. It allows [place] to keep an eye on the functioning of your device to ensure it is working properly. You are scheduled for a device check from home on 05/09/2019. You may send your transmission at any time that day. If you have a wireless device, the transmission will be sent automatically. After your physician reviews your transmission, you will receive a postcard with your next transmission date.  Any Other Special Instructions Will Be Listed Below (If Applicable).  If you need a refill on your cardiac medications before your next appointment, please call your pharmacy.

## 2019-05-06 ENCOUNTER — Telehealth: Payer: Self-pay | Admitting: Internal Medicine

## 2019-05-06 NOTE — Telephone Encounter (Signed)
Noted. Thanks. Will close encounter

## 2019-05-06 NOTE — Telephone Encounter (Signed)
[name] lives in [place] [id number] -> last seen in December 2020 by Derl Barrow.  Spirometry at that time showed improvement and she is on observation therapy.  She was told to come back in 3 months with is no follow-up appointment that I see.  Plan -Get spirometry and DLCO done at [facility] or here in [place] to see me in April 2021 for 15-minute visit. -ILD clinic or any clinic [If you cannot get spirometry done by the time of the visit that is fine just get her in]

## 2019-05-09 ENCOUNTER — Ambulatory Visit (INDEPENDENT_AMBULATORY_CARE_PROVIDER_SITE_OTHER): Payer: Medicare Other | Admitting: *Deleted

## 2019-05-09 DIAGNOSIS — I495 Sick sinus syndrome: Secondary | ICD-10-CM

## 2019-05-10 NOTE — Telephone Encounter (Signed)
Called and spoke with pt to see if I could get her scheduled for a f/u with MR and she stated that was fine. Pt has been scheduled appt with MR 4/13.nothing further needed.

## 2019-05-11 LAB — CUP PACEART REMOTE DEVICE CHECK
Battery Remaining Longevity: 46 mo
Battery Voltage: 2.99 V
Brady Statistic AP VP Percent: 0.9 %
Brady Statistic AP VS Percent: 0.01 %
Brady Statistic AS VP Percent: 97.42 %
Brady Statistic AS VS Percent: 1.67 %
Brady Statistic RA Percent Paced: 0.63 %
Brady Statistic RV Percent Paced: 98.55 %
Date Time Interrogation Session: 20210223170348
Implantable Lead Implant Date: 20160321
Implantable Lead Implant Date: 20160321
Implantable Lead Location: 753859
Implantable Lead Location: 753860
Implantable Lead Model: 5076
Implantable Lead Model: 5076
Implantable Pulse Generator Implant Date: 20160321
Lead Channel Impedance Value: 361 Ohm
Lead Channel Impedance Value: 494 Ohm
Lead Channel Impedance Value: 551 Ohm
Lead Channel Impedance Value: 589 Ohm
Lead Channel Pacing Threshold Amplitude: 0.625 V
Lead Channel Pacing Threshold Amplitude: 0.875 V
Lead Channel Pacing Threshold Pulse Width: 0.4 ms
Lead Channel Pacing Threshold Pulse Width: 0.4 ms
Lead Channel Sensing Intrinsic Amplitude: 0.5 mV
Lead Channel Sensing Intrinsic Amplitude: 0.5 mV
Lead Channel Sensing Intrinsic Amplitude: 7.125 mV
Lead Channel Sensing Intrinsic Amplitude: 7.125 mV
Lead Channel Setting Pacing Amplitude: 2 V
Lead Channel Setting Pacing Amplitude: 2.5 V
Lead Channel Setting Pacing Pulse Width: 0.4 ms
Lead Channel Setting Sensing Sensitivity: 4 mV

## 2019-05-11 NOTE — Progress Notes (Signed)
PPM Remote

## 2019-06-06 ENCOUNTER — Other Ambulatory Visit: Payer: Self-pay | Admitting: Cardiovascular Disease

## 2019-06-07 ENCOUNTER — Telehealth: Payer: Self-pay | Admitting: Cardiovascular Disease

## 2019-06-07 NOTE — Telephone Encounter (Signed)
Ok to start [place]

## 2019-06-07 NOTE — Telephone Encounter (Signed)
Pt c/o medication issue:  1. Name of Medication: Farxiga 77m  2. How are you currently taking this medication (dosage and times per day)? n/a  3. Are you having a reaction (difficulty breathing--STAT)? No  4. What is your medication issue? Patient's son is calling in because patients PCP would like to put the patient on this medication. He states it is used to treat high blood sugar as well as CHF. Please advise if Dr. KClaiborne Billingswould approve of the patient taking this medication.

## 2019-06-08 NOTE — Telephone Encounter (Signed)
Spoke with pt's daughter Arrie Aran (per DPR) and notified that Dr.Kelly was okay with her mother starting the farxiga. She verbalized understanding and had no other questions at this time.   Daughter wanted to make aware that the pts pcp was going to increase her mother's fenofibrate as well. She knew Dr.Kelly had prescribed this. Notified I would let Dr.Kelly know.

## 2019-06-10 NOTE — Telephone Encounter (Signed)
Noted.

## 2019-06-10 NOTE — Telephone Encounter (Signed)
Okay to take [place] with fenofibrate

## 2019-06-21 ENCOUNTER — Telehealth (INDEPENDENT_AMBULATORY_CARE_PROVIDER_SITE_OTHER): Payer: Medicare Other | Admitting: Physician Assistant

## 2019-06-21 ENCOUNTER — Encounter: Payer: Self-pay | Admitting: Physician Assistant

## 2019-06-21 VITALS — BP 146/78 | HR 79 | Ht 64.0 in | Wt 219.0 lb

## 2019-06-21 DIAGNOSIS — I2510 Atherosclerotic heart disease of native coronary artery without angina pectoris: Secondary | ICD-10-CM

## 2019-06-21 DIAGNOSIS — I25810 Atherosclerosis of coronary artery bypass graft(s) without angina pectoris: Secondary | ICD-10-CM

## 2019-06-21 DIAGNOSIS — E1142 Type 2 diabetes mellitus with diabetic polyneuropathy: Secondary | ICD-10-CM | POA: Diagnosis not present

## 2019-06-21 DIAGNOSIS — I10 Essential (primary) hypertension: Secondary | ICD-10-CM

## 2019-06-21 DIAGNOSIS — E785 Hyperlipidemia, unspecified: Secondary | ICD-10-CM

## 2019-06-21 DIAGNOSIS — Z950 Presence of cardiac pacemaker: Secondary | ICD-10-CM

## 2019-06-21 DIAGNOSIS — I480 Paroxysmal atrial fibrillation: Secondary | ICD-10-CM

## 2019-06-21 DIAGNOSIS — Z951 Presence of aortocoronary bypass graft: Secondary | ICD-10-CM

## 2019-06-21 DIAGNOSIS — Z7189 Other specified counseling: Secondary | ICD-10-CM

## 2019-06-21 DIAGNOSIS — E119 Type 2 diabetes mellitus without complications: Secondary | ICD-10-CM

## 2019-06-21 NOTE — Progress Notes (Signed)
Virtual Visit via Telephone Note   This visit type was conducted due to national recommendations for restrictions regarding the COVID-19 Pandemic (e.g. social distancing) in an effort to limit this patient's exposure and mitigate transmission in our community.  Due to her co-morbid illnesses, this patient is at least at moderate risk for complications without adequate follow up.  This format is felt to be most appropriate for this patient at this time.  The patient did not have access to video technology/had technical difficulties with video requiring transitioning to audio format only (telephone).  All issues noted in this document were discussed and addressed.  No physical exam could be performed with this format.  Please refer to the patient's chart for her  consent to telehealth for [facility].   The patient was identified using 2 identifiers.  Date:  06/23/2019   ID:  [name], DOB 1945-12-04, MRN [id number]  Patient Location: Home Provider Location: Home  PCP:  Wannetta Sender, FNP  Cardiologist:  Shelva Majestic, MD  Electrophysiologist:  Cristopher Peru, MD   Evaluation Performed:  Follow-Up Visit  Chief Complaint:  followup  History of Present Illness:    [name] is a 74 y.o. female with CAD s/p CABG x2 2005, DM 2, peripheral neuropathy, hypertension, OSA, PPM and atrial fibrillation s/p MAZE.  Echocardiogram obtained in July 2014 showed normal EF, mild aortic stenosis.  Myoview obtained during the same year showed mild breast attenuation but normal perfusion.  She subsequently underwent pacemaker insertion by Dr. [name].  She was admitted for recurrent atrial fibrillation with RVR in June 2017.  Echocardiogram at that time continue to show EF of 60 to 65%, mild aortic stenosis.  She was placed on amiodarone at the time.  Sleep study obtained in November 2017 demonstrated moderate obstructive sleep apnea, she was placed on CPAP therapy.  Amiodarone was stopped  due to abnormal pulmonary function testing October 2019 suggesting severe restrictive physiology including possible pulmonary fibrosis.  She had another atrial fibrillation with RVR requiring admission in September 2020.  Diltiazem was increased to 480 mg daily during the hospitalization.  Heart rate has been poorly controlled since then on multiple follow-up.  She was seen by EP service on 03/24/2019, after prolonged discussion, it was recommended for her to proceed with AV nodal ablation as she would not be a candidate for Tikosyn due to prolonged QT. This was performed on 03/28/2019.  Patient presents today for cardiology office visit.  She denies any chest pain or shortness of breath.  Overall she has been doing quite well on the current therapy.  Unfortunately she could not tolerate Wilder Glade as it caused irritation in her private area.  Her cholesterol was not very well controlled in January 2020 which was the last lipid panel via our system.  She says she had another one done roughly 2 months ago.  We will request the lab work results.  Overall, she is doing well from a heart perspective and can follow-up in 6 months.  She does mention that she has not been using CPAP therapy lately due to throat dryness caused by the machine.  I will reach out to our sleep study coordinator to see if there are any treatment available for this.   The patient does not have symptoms concerning for COVID-19 infection (fever, chills, cough, or new shortness of breath).    Past Medical History:  Diagnosis Date  . Asthmatic bronchitis   . CHF (congestive heart  failure) ([place]) dx'd 2016  . Coronary atherosclerosis of native coronary artery    a. s/p CABG 2005 Dr Roxy Manns    . Hyperlipidemia   . Hypertension   . Hypertensive heart disease   . Kidney stones   . Mixed hyperlipidemia   . Nephrolithiasis 06/07/2015  . Paroxysmal atrial fibrillation ([place])    a. s/p Maze in 2005 b. recurrence in March 2016 with >8sec  posttermination pauses s/p PPM implant    . Pneumonia 1950   "double"  . Presence of permanent cardiac pacemaker   . Restrictive lung disease 03/03/2018  . Sleep apnea    "tested; mask ordered; couldn't afford it; will get it now" (09/04/2015)  . TIA (transient ischemic attack)   . Type II diabetes mellitus ([place])   . Uterine cancer [facility])    age 14 with partial hysterectomy   Past Surgical History:  Procedure Laterality Date  . AV NODE ABLATION N/A 03/28/2019   Procedure: AV NODE ABLATION;  Surgeon: Evans Lance, MD;  Location: [place] [facility];  Service: Cardiovascular;  Laterality: N/A;  . CARDIAC CATHETERIZATION  2005  . CORONARY ARTERY BYPASS GRAFT  2005   Dr Roxy Manns with MAZE  . CYSTOSCOPY WITH URETEROSCOPY, STONE BASKETRY AND STENT PLACEMENT Left 09/08/2014   Procedure: CYSTOSCOPY WITH URETEROSCOPY, STONE BASKETRY AND STENT PLACEMENT;  Surgeon: Cleon Gustin, MD;  Location: [facility];  Service: Urology;  Laterality: Left;  . CYSTOSCOPY/URETEROSCOPY/HOLMIUM LASER/STENT PLACEMENT Left 09/15/2014   Procedure: CYSTOSCOPY/RETROGRADE/URETEROSCOPY/HOLMIUM LASER/ STONE EXTRACTION /STENT EXCHANGE;  Surgeon: Cleon Gustin, MD;  Location: [facility];  Service: Urology;  Laterality: Left;  . HOLMIUM LASER APPLICATION Left 0/12/2723   Procedure: HOLMIUM LASER APPLICATION;  Surgeon: Cleon Gustin, MD;  Location: [facility];  Service: Urology;  Laterality: Left;  . HOLMIUM LASER APPLICATION Left 05/21/6438   Procedure: HOLMIUM LASER APPLICATION;  Surgeon: Cleon Gustin, MD;  Location: [facility];  Service: Urology;  Laterality: Left;  . INSERT / REPLACE / REMOVE PACEMAKER    . LAPAROSCOPIC CHOLECYSTECTOMY    . 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 VAGINAL HYSTERECTOMY  1975     Current Meds  Medication Sig  . acetaminophen (TYLENOL) 500 MG tablet Take 500-1,000 mg by mouth every 6 (six) hours as  needed for moderate pain.  [name] apixaban (ELIQUIS) 5 MG TABS tablet Take 1 tablet (5 mg total) by mouth 2 (two) times daily. Take 1st dose 06-07-14  . aspirin 81 MG tablet Take 81 mg by mouth daily.   . B Complex-Biotin-FA (B-COMPLEX PO) Take 1 capsule by mouth every morning.   [name] BIOTIN 5000 PO Take 1 tablet by mouth daily.  . Cholecalciferol (VITAMIN D3) 1000 UNITS CAPS Take 1,000 Units by mouth daily.  . Coenzyme Q10 (CO Q-10) 200 MG CAPS Take 200 mg by mouth daily.  . diphenhydramine-acetaminophen (TYLENOL PM) 25-500 MG TABS tablet Take 1 tablet by mouth at bedtime as needed (pain/sleep).   . fenofibrate 160 MG tablet TAKE 1 TABLET DAILY  . furosemide (LASIX) 80 MG tablet Take 20-80 mg by mouth See admin instructions. Take 80 mg by mouth in the morning and 20 mg in the evening  . losartan (COZAAR) 50 MG tablet TAKE 1 TABLET UPTO TWICE A DAY AS DIRECTED  . metFORMIN (GLUCOPHAGE) 1000 MG tablet Take 1,000 mg by mouth 2 (two) times daily with a meal.   . metoprolol succinate (  TOPROL-XL) 100 MG 24 hr tablet Take 1 tablet daily with or immediatelyfollowing a meal.  . potassium chloride (KLOR-CON) 10 MEQ tablet Take 20 mEq by mouth daily.  . Semaglutide (OZEMPIC, 0.25 OR 0.5 MG/DOSE, ) Inject 1 mg into the skin once a week.   . TRESIBA FLEXTOUCH 100 UNIT/ML SOPN FlexTouch Pen Inject 28 Units into the skin 2 (two) times daily.   . [DISCONTINUED] ezetimibe (ZETIA) 10 MG tablet Take 10 mg by mouth daily.     Allergies:   Patient has no known allergies.   Social History   Tobacco Use  . Smoking status: Never Smoker  . Smokeless tobacco: Never Used  Substance Use Topics  . Alcohol use: No  . Drug use: No     Family Hx: The patient's family history includes Arthritis in her brother; Diabetes in her father; Heart attack in her maternal grandfather; Heart disease in her father and mother; Hyperlipidemia in her mother; Hypertension in her daughter, mother, and sister; Stroke in her father and  paternal grandfather.  ROS:   Please see the history of present illness.     All other systems reviewed and are negative.   Prior CV studies:   The following studies were reviewed today:  Echo 12/09/2018 1. Left ventricular ejection fraction, by visual estimation, is 55%. The  left ventricle has normal function. Normal left ventricular size. There is  mildly increased left ventricular hypertrophy.  2. Definity contrast agent was given IV to delineate the left ventricular  endocardial borders.  3. Left ventricular diastolic Doppler parameters are indeterminate  pattern of LV diastolic filling.  4. The RV was poorly visualized. Global right ventricle has mildly  reduced systolic function.The right ventricular size is mildly enlarged.  No increase in right ventricular wall thickness. D-shaped interventricular  septum suggestive of RV  pressure/volume overload.  5. Moderate mitral annular calcification.  6. The mitral valve is degenerative. No evidence of mitral valve  regurgitation. No evidence of mitral stenosis.  7. The tricuspid valve is normal in structure. Tricuspid valve  regurgitation is trivial.  8. The aortic valve is tricuspid Aortic valve regurgitation is trivial by  color flow Doppler. Mild to moderate aortic valve sclerosis/calcification  without any evidence of aortic stenosis.  9. There is mild dilatation of the ascending aorta measuring 37 mm.  10. A pacer wire is visualized.  11. Left atrial size was mildly dilated.  12. The inferior vena cava is dilated in size with <50% respiratory  variability, suggesting right atrial pressure of 15 mmHg.  13. The tricuspid regurgitant velocity is 2.73 m/s, and with an assumed  right atrial pressure of 15 mmHg, the estimated right ventricular systolic  pressure is moderately elevated at 44.8 mmHg.  14. Right atrial size was not well visualized.   Labs/Other Tests and Data Reviewed:    EKG:  An ECG dated 04/28/2019  was personally reviewed today and demonstrated:  Atrial fibrillation with ventricular pacing  Recent Labs: 12/08/2018: B Natriuretic Peptide 110.5 12/10/2018: Magnesium 1.6 12/11/2018: ALT 24 03/24/2019: BUN 17; Creatinine, Ser 0.88; Hemoglobin 12.2; Platelets 381; Potassium 4.0; Sodium 137   Recent Lipid Panel Lab Results  Component Value Date/Time   CHOL 188 03/19/2018 09:31 AM   TRIG 228 (H) 03/19/2018 09:31 AM   HDL 48 03/19/2018 09:31 AM   CHOLHDL 3.9 03/19/2018 09:31 AM   CHOLHDL 4.7 09/08/2016 09:10 AM   LDLCALC 94 03/19/2018 09:31 AM    Wt Readings from Last 3 Encounters:  06/21/19 219 lb (99.3 kg)  04/28/19 219 lb (99.3 kg)  04/13/19 223 lb (101.2 kg)     Objective:    Vital Signs:  BP (!) 146/78   Pulse 79   Ht _0  (1.626 m)   Wt 219 lb (99.3 kg)   BMI 37.59 kg/m    VITAL SIGNS:  reviewed  ASSESSMENT & PLAN:    1. CAD s/p CABGx2 in 2005: Denies any recent chest discomfort.   2. Hyperlipidemia: We will request lab work from primary care provider.  Previous lab work in 2020 showed uncontrolled cholesterol.  She apparently has had repeat lab work earlier this year.  3. DM 2: Followed by primary care provider.  Could not tolerate Farxiga  4. Hypertension: Blood pressure stable  5. History of atrial fibrillation s/p AV node ablation and Maze procedure  -She had abnormal pulmonary function test on amiodarone therapy.  QT was prolonged therefore could not use Tikosyn.  Due to recurrent atrial fibrillation and difficult to control heart rate, she eventually underwent AV nodal ablation in January 2021.  6. History of pacemaker s/p AV nodal ablation: Followed by Dr. [name]  COVID-19 Education: The signs and symptoms of COVID-19 were discussed with the patient and how to seek care for testing (follow up with PCP or arrange E-visit).  The importance of social distancing was discussed today.  Time:   Today, I have spent 10 minutes with the patient with telehealth  technology discussing the above problems.     Medication Adjustments/Labs and Tests Ordered: Current medicines are reviewed at length with the patient today.  Concerns regarding medicines are outlined above.   Tests Ordered: No orders of the defined types were placed in this encounter.   Medication Changes: No orders of the defined types were placed in this encounter.   Follow Up:  Either In Person or Virtual in 6 month(s)  Signed, Almyra Deforest, [place]  06/23/2019 2:26 PM    [place]

## 2019-06-21 NOTE — Patient Instructions (Signed)
Medication Instructions:  Your physician recommends that you continue on your current medications as directed. Please refer to the Current Medication list given to you today.  *If you need a refill on your cardiac medications before your next appointment, please call your pharmacy*   Lab Work: None ordered   If you have labs (blood work) drawn today and your tests are completely normal, you will receive your results only by: [name] MyChart Message (if you have MyChart) OR . A paper copy in the mail If you have any lab test that is abnormal or we need to change your treatment, we will call you to review the results.   Testing/Procedures: None ordered 3   Follow-Up: At [facility], you and your health needs are our priority.  As part of our continuing mission to provide you with exceptional heart care, we have created designated Provider Care Teams.  These Care Teams include your primary Cardiologist (physician) and Advanced Practice Providers (APPs -  Physician Assistants and Nurse Practitioners) who all work together to provide you with the care you need, when you need it.  We recommend signing up for the patient portal called "MyChart".  Sign up information is provided on this After Visit Summary.  MyChart is used to connect with patients for Virtual Visits (Telemedicine).  Patients are able to view lab/test results, encounter notes, upcoming appointments, etc.  Non-urgent messages can be sent to your provider as well.   To learn more about what you can do with MyChart, go to [email or web address].    Your next appointment:   6 month(s)  The format for your next appointment:   In Person  Provider:   Shelva Majestic, MD   Other Instructions None

## 2019-06-28 ENCOUNTER — Encounter: Payer: Self-pay | Admitting: Internal Medicine

## 2019-06-28 ENCOUNTER — Other Ambulatory Visit: Payer: Self-pay

## 2019-06-28 ENCOUNTER — Ambulatory Visit: Payer: Medicare Other | Admitting: Internal Medicine

## 2019-06-28 VITALS — BP 132/76 | HR 96 | Ht 64.0 in | Wt 223.0 lb

## 2019-06-28 DIAGNOSIS — J849 Interstitial pulmonary disease, unspecified: Secondary | ICD-10-CM | POA: Diagnosis not present

## 2019-06-28 DIAGNOSIS — Z7729 Contact with and (suspected ) exposure to other hazardous substances: Secondary | ICD-10-CM

## 2019-06-28 DIAGNOSIS — Z9229 Personal history of other drug therapy: Secondary | ICD-10-CM | POA: Diagnosis not present

## 2019-06-28 NOTE — Patient Instructions (Addendum)
ICD-10-CM   1. ILD (interstitial lung disease) ([place])  J84.9   2. Long-term exposure involving bird droppings  Z77.29   3. History of amiodarone therapy  Z92.29   4. OSA (obstructive sleep apnea)  G47.33     ILD (interstitial lung disease) ([place]) -otherwise call pulmonary fibrosis Long-term exposure involving bird droppings History of amiodarone therapy   -Overall stable  Plan -Support expectant approach to care -Do repeat spirometry and DLCO in October 2021 -Do high-resolution CT scan of the chest in October 2021  Follow-up -October 2021 but after completing the above in the ILD clinic

## 2019-06-28 NOTE — Addendum Note (Signed)
Addended by: Tery Sanfilippo R on: 06/28/2019 01:09 PM   Modules accepted: Orders

## 2019-06-28 NOTE — Progress Notes (Signed)
December 10, 2018 inpatient pulmonary consult   74 year old never smoker with medical history significant ofCAD status post CABG, paroxysmal atrial fibrillation on Eliquis, heart failure status post pacemaker, ILD, amiodarone pulmonary toxicity, restrictive lung disease, sleep apnea(not on CPAP),insulin-dependent type 2 diabetes, hypertension who presents with worsening shortness of breath, orthopnea and nausea.  Patient reports that for  2 to 3 weeks  prior to admission she has noticed increasing dyspnea with exertion. She was having trouble just walking to her mailbox and back.  She endorses that this has  progressively  worsened to the point that she could not sleep due to orthopnea. She also noted increased nausea and increase lower extremity edema. She is seen by  Dr. Valeta Harms in the pulmonary office.She had a telephone visit with a pulmonary NP 9/23 and was advised to follow up with cardiology. She is non-compliant with her CPAP therapy. Last HRCT is also concerning for pulmonary HTN as well as IPF. Marland KitchenPt was on Amiodarone x 3 years from 2016-2019. 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  In the ED she was afebrile and mildly hypertensive 140/90. Tele indicated A fib with RVR, HR of 140. She was bolused  with  diltiazem with minimal improvement and was ultimately started on a diltiazem drip. She was also given IV 80 mg Lasix.Her blood pressure remained stable. CBC showed no anemia or leukocytosis. BMP showed elevated glucose of 219 and normal stable creatinine. Troponin of 9. BNP of 110.Chest x-ray showed stable mild cardiac enlargement and no acute processes.She was admitted by the [facility] team. PCCM have been consulted to assist with management of drug induced ILD and untreated  OSA.   Results: Shortness of breath symptoms Score >> 60 + productive cough at night No hemoptysis at present, but has coughed up blood in the past. 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 , MRN: [id number] , ADDRESS: [address] [place] [address]   12/28/2018 -   Chief Complaint  Patient presents with  . Follow-up    Pt states her breathing has improved since d/c but not back to baseline. Pt states she has an occassional cough in the morning that produces white mucus. Pt denies CP/tightness and f/c/s.    Follow-up interstitial lung disease from a hospital stay  HPI [name] 74 y.o. -returns for follow-up with her daughter.  Last seen end of September 2020 in the hospital.  Since hospitalization she reports improved symptomatology in terms of her shortness of breath.  The dyspnea score clearly shows that.  She did have pulmonary function test while in the hospital that shows a decline compared to a year ago but this could be reflective of her hospital  stay.  She tells me that when she walks she gets short of breath and this limits her.  However in the office today when we walked her she got limited by right lateral pain almost typical of meralgia paresthetica.  This been gone for a few weeks.  She will talk to her primary care physician about this.  We did notice that when we walked her 185 feet and she stopped because of her leg pain her  pulse ox went down to 89%.  She is not on oxygen either at night or in the daytime.  She uses CPAP for chronic sleep apnea managed by Dr. Ellouise Newer.  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  Spent a lot of time delving into this.  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  They did this for 15 or 20 years.  The last exposure was in the 1990s.  During the time they had this in the form it was in the outdoors.  The avuiary area was large but enclosed.  They had 200 chickens and 20 pigeons.  They will show birds.  Patient would spend at least once a week and this a very for the whole day without a mask.  During this time she also worked in a [employer] and would come home every day with different color yarn on her body.  During this time both her and her husband had multiple admissions for pneumonia.  Especially she had more and would take antibiotics and prednisone.  The husband also currently has shortness of breath but the patient feels this because of sedentary living and also obesity.  Patient states that since her last exposure to these birds she was actually doing well till she started taking amiodarone a few years ago and then this culminated in the diagnosis of ILD and she stopped her amiodarone a year ago but the dyspnea on exertion has persisted.  Today we discussed her high-resolution CT at the multidisciplinary case conference along with Dr. Salvatore Marvel our thoracic radiologist.  Significant air trapping documented.  There is no craniocaudal gradient.  There is diffuse infiltrates.  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  OV 06/28/2019  Subjective:  Patient ID: [name], female , DOB: 13-Aug-1945 , age 74  y.o. , MRN: [id number] , ADDRESS: [place] [address]   06/28/2019 -   Chief Complaint  Patient presents with  . Follow-up    Pt states she has been doing okay since last visit. States if she exerts herself, she will become SOB.   Follow-up interstitial lung disease with alternative pattern to UIP on a high-resolution CT scan of the chest.  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 -presents for follow-up last seen October 2020.  She and her daughter say that she is doing stable and well since then.  In January 2020 when she had ablation for atrial fibrillation after that effort tolerance is improved.  She is not had COVID-19 vaccine.  She says she does not believe the vaccine to be effective.   She continues to want to have an expectant approach to her ILD.  Med review does not show she is on amiodarone anymore.  Overall she is satisfied with the quality of life.  She is not inclined towards any biopsy at this point in time.      SYMPTOM SCALE - ILD Sept 2020 at ospital 12/28/2018  06/28/2019   O2 use  RA =- improved after hospitalization   Shortness of Breath  0 -> 5 scale with 5 being worst (score 6 If unable to do)   At rest 1 0 0  Simple tasks - showers, clothes change, eating, shaving _0 Household (dishes, doing bed, laundry) _1 Shopping _2 Walking keeping up with others of same age 19 3 0  Walking up Stairs _3 Total (40 - 48) Dyspnea Score _4 How bad is your cough?  1 0  How bad is your fatigue  3 2  cough   0  fatigue   0  nausea   0  vomit   0  diarrhea   0  anxiety   1  depression   0        Simple office walk 185 feet x  3 laps goal with forehead probe 12/28/2018  06/28/2019   O2 used RA ra  Number laps completed 1 of intendeed 3   Comments about pace Pretty slow Slow pace  Resting Pulse Ox/HR 96% and 68/min 99% and 73  Final Pulse Ox/HR 89%  and 120/min 94% and 125  Desaturated </= 88% no no  Desaturated <= 3% points Yes, 7 points Yes, 5 piints  Got Tachycardic >/= 90/min yes yes  Symptoms at end of test Mild dyspnea, stopped due to leg pain Mild dyspnea  Miscellaneous comments none Stopped briefly at 2nd lap    Results for Dana, Paul (MRN [id number]) as of 12/28/2018 15:18  Ref. Range 12/28/2017 15:47 12/12/2018 16:50 - done while in  Hospital for diast chf 03/01/2019  FVC-Pre Latest Units: L  1.23 1.55  FVC-%Pred-Pre Latest Units: % 56 42 54   Results for Dana, DEVENPORT (MRN [id number]) as of 12/28/2018 15:18  Ref. Range 12/28/2017 15:47 12/12/2018 16:50 03/01/2019  FVC-Post Latest Units: L 1.61    FVC-%Pred-Post Latest Units: % 55    Results for Dana, Paul (MRN [id number]) as of 12/28/2018 15:18  Ref. Range 12/28/2017 15:47 12/12/2018 16:50 03/01/2019  DLCO unc Latest Units: ml/min/mmHg 15.97 11.35 18.22  DLCO unc % pred Latest Units: % 67 59 95%    ROS - per HPI     has a past medical history of Asthmatic bronchitis, CHF (congestive heart failure) ([place]) (dx'd 2016), Coronary atherosclerosis of native coronary artery, Hyperlipidemia, Hypertension, Hypertensive heart disease, Kidney stones, Mixed hyperlipidemia, Nephrolithiasis (06/07/2015), Paroxysmal atrial fibrillation ([place]), Pneumonia (1950), Presence of permanent cardiac pacemaker, Restrictive lung disease (03/03/2018), Sleep apnea, TIA (transient ischemic attack), Type II diabetes mellitus ([place]), and Uterine cancer ([place]).   reports that she has never smoked. She has never used smokeless tobacco.  Past Surgical History:  Procedure Laterality Date  . AV NODE ABLATION N/A 03/28/2019   Procedure: AV NODE ABLATION;  Surgeon: Evans Lance, MD;  Location: [facility];  Service: Cardiovascular;  Laterality: N/A;  . CARDIAC CATHETERIZATION  2005  . CORONARY ARTERY BYPASS GRAFT  2005   Dr Roxy Manns with MAZE  . CYSTOSCOPY WITH URETEROSCOPY, STONE  BASKETRY AND STENT PLACEMENT Left 09/08/2014   Procedure: CYSTOSCOPY WITH URETEROSCOPY, STONE BASKETRY  AND STENT PLACEMENT;  Surgeon: Cleon Gustin, MD;  Location: [facility];  Service: Urology;  Laterality: Left;  . CYSTOSCOPY/URETEROSCOPY/HOLMIUM LASER/STENT PLACEMENT Left 09/15/2014   Procedure: CYSTOSCOPY/RETROGRADE/URETEROSCOPY/HOLMIUM LASER/ STONE EXTRACTION /STENT EXCHANGE;  Surgeon: Cleon Gustin, MD;  Location: [facility];  Service: Urology;  Laterality: Left;  . HOLMIUM LASER APPLICATION Left 06/17/4740   Procedure: HOLMIUM LASER APPLICATION;  Surgeon: Cleon Gustin, MD;  Location: [facility];  Service: Urology;  Laterality: Left;  . HOLMIUM LASER APPLICATION Left 07/23/5636   Procedure: HOLMIUM LASER APPLICATION;  Surgeon: Cleon Gustin, MD;  Location: [facility];  Service: Urology;  Laterality: Left;  . INSERT / REPLACE / REMOVE PACEMAKER    . LAPAROSCOPIC CHOLECYSTECTOMY    . 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 VAGINAL HYSTERECTOMY  1975    No Known Allergies  Immunization History  Administered Date(s) Administered  . Pneumococcal Conjugate-13 12/09/2016    Family History  Problem Relation Age of Onset  . Hypertension Mother   . Hyperlipidemia Mother   . Heart disease Mother   . Diabetes Father   . Stroke Father   . Heart disease Father   . Hypertension Sister        3 sisters  . Arthritis Brother   . Heart attack Maternal Grandfather   . Stroke Paternal Grandfather   . Hypertension Daughter      Current Outpatient Medications:  .  acetaminophen (TYLENOL) 500 MG tablet, Take 500-1,000 mg by mouth every 6 (six) hours as needed for moderate pain., Disp: , Rfl:  .  apixaban (ELIQUIS) 5 MG TABS tablet, Take 1 tablet (5 mg total) by mouth 2 (two) times daily. Take 1st dose 06-07-14, Disp: 60 tablet, Rfl: 1 .  aspirin 81 MG tablet, Take 81 mg by mouth daily. , Disp: , Rfl:  .   B Complex-Biotin-FA (B-COMPLEX PO), Take 1 capsule by mouth every morning. , Disp: , Rfl:  .  BIOTIN 5000 PO, Take 1 tablet by mouth daily., Disp: , Rfl:  .  Cholecalciferol (VITAMIN D3) 1000 UNITS CAPS, Take 1,000 Units by mouth daily., Disp: , Rfl:  .  Coenzyme Q10 (CO Q-10) 200 MG CAPS, Take 200 mg by mouth daily., Disp: , Rfl:  .  diphenhydramine-acetaminophen (TYLENOL PM) 25-500 MG TABS tablet, Take 1 tablet by mouth at bedtime as needed (pain/sleep). , Disp: , Rfl:  .  fenofibrate 160 MG tablet, TAKE 1 TABLET DAILY, Disp: 90 tablet, Rfl: 3 .  furosemide (LASIX) 80 MG tablet, Take 20-80 mg by mouth See admin instructions. Take 80 mg by mouth in the morning and 20 mg in the evening, Disp: , Rfl:  .  losartan (COZAAR) 50 MG tablet, TAKE 1 TABLET UPTO TWICE A DAY AS DIRECTED, Disp: 180 tablet, Rfl: 2 .  metFORMIN (GLUCOPHAGE) 1000 MG tablet, Take 1,000 mg by mouth 2 (two) times daily with a meal. , Disp: , Rfl:  .  metoprolol succinate (TOPROL-XL) 100 MG 24 hr tablet, Take 1 tablet daily with or immediatelyfollowing a meal., Disp: 90 tablet, Rfl: 3 .  potassium chloride (KLOR-CON) 10 MEQ tablet, Take 20 mEq by mouth daily., Disp: , Rfl:  .  Semaglutide (OZEMPIC, 0.25 OR 0.5 MG/DOSE, [place]), Inject 1 mg into the skin once a week. , Disp: , Rfl:  .  TRESIBA FLEXTOUCH 100 UNIT/ML SOPN FlexTouch Pen, Inject 28 Units into the skin 2 (two) times daily. ,  Disp: , Rfl:       Objective:   Vitals:   06/28/19 1055  BP: 132/76  Pulse: 96  SpO2: 100%  Weight: 223 lb (101.2 kg)  Height: _0  (1.626 m)    Estimated body mass index is 38.28 kg/m as calculated from the following:   Height as of this encounter: _1  (1.626 m).   Weight as of this encounter: 223 lb (101.2 kg).  _2 @  [employer]   06/28/19 1055  Weight: 223 lb (101.2 kg)     Physical Exam Obese pleasant female with a mask.  Scattered crackles and squeaks in the lung field but otherwise clear.  Normal heart  sounds.        Assessment:       ICD-10-CM   1. ILD (interstitial lung disease) ([place])  J84.9   2. Long-term exposure involving bird droppings  Z77.29   3. History of amiodarone therapy  Z92.29        Plan:     Patient Instructions     ICD-10-CM   1. ILD (interstitial lung disease) ([place])  J84.9   2. Long-term exposure involving bird droppings  Z77.29   3. History of amiodarone therapy  Z92.29   4. OSA (obstructive sleep apnea)  G47.33     ILD (interstitial lung disease) ([place]) -otherwise call pulmonary fibrosis Long-term exposure involving bird droppings History of amiodarone therapy   -Overall stable  Plan -Support expectant approach to care -Do repeat spirometry and DLCO in October 2021 -Do high-resolution CT scan of the chest in October 2021  Follow-up -October 2021 but after completing the above in the ILD clinic     SIGNATURE    Dr. Brand Males, M.D., F.C.C.P,  Pulmonary and Critical Care Medicine Staff Physician, [place] Director - Interstitial Lung Disease  Program  Pulmonary [facility] [place], [place], [address]  Pager: [phone], If no answer or between  15:00h - 7:00h: call [phone] Telephone: [phone]  12:05 PM 06/28/2019

## 2019-08-08 ENCOUNTER — Ambulatory Visit (INDEPENDENT_AMBULATORY_CARE_PROVIDER_SITE_OTHER): Payer: Medicare Other | Admitting: *Deleted

## 2019-08-08 DIAGNOSIS — I495 Sick sinus syndrome: Secondary | ICD-10-CM

## 2019-08-09 ENCOUNTER — Telehealth: Payer: Self-pay

## 2019-08-09 NOTE — Telephone Encounter (Signed)
Left message for patient to remind of missed remote transmission.

## 2019-08-11 NOTE — Progress Notes (Signed)
Remote pacemaker transmission.   

## 2019-08-12 LAB — CUP PACEART REMOTE DEVICE CHECK
Battery Remaining Longevity: 39 mo
Battery Voltage: 2.98 V
Brady Statistic AP VP Percent: 0.82 %
Brady Statistic AP VS Percent: 0 %
Brady Statistic AS VP Percent: 97.72 %
Brady Statistic AS VS Percent: 1.46 %
Brady Statistic RA Percent Paced: 0.59 %
Brady Statistic RV Percent Paced: 98.77 %
Date Time Interrogation Session: 20210527144308
Implantable Lead Implant Date: 20160321
Implantable Lead Implant Date: 20160321
Implantable Lead Location: 753859
Implantable Lead Location: 753860
Implantable Lead Model: 5076
Implantable Lead Model: 5076
Implantable Pulse Generator Implant Date: 20160321
Lead Channel Impedance Value: 361 Ohm
Lead Channel Impedance Value: 494 Ohm
Lead Channel Impedance Value: 532 Ohm
Lead Channel Impedance Value: 570 Ohm
Lead Channel Pacing Threshold Amplitude: 0.5 V
Lead Channel Pacing Threshold Amplitude: 0.875 V
Lead Channel Pacing Threshold Pulse Width: 0.4 ms
Lead Channel Pacing Threshold Pulse Width: 0.4 ms
Lead Channel Sensing Intrinsic Amplitude: 0.375 mV
Lead Channel Sensing Intrinsic Amplitude: 0.375 mV
Lead Channel Sensing Intrinsic Amplitude: 22.375 mV
Lead Channel Sensing Intrinsic Amplitude: 22.375 mV
Lead Channel Setting Pacing Amplitude: 2 V
Lead Channel Setting Pacing Amplitude: 2.5 V
Lead Channel Setting Pacing Pulse Width: 0.4 ms
Lead Channel Setting Sensing Sensitivity: 4 mV

## 2019-09-02 ENCOUNTER — Other Ambulatory Visit: Payer: Self-pay | Admitting: Cardiovascular Disease

## 2019-09-16 ENCOUNTER — Other Ambulatory Visit: Payer: Self-pay | Admitting: Internal Medicine

## 2019-09-16 MED ORDER — FUROSEMIDE 80 MG PO TABS: ORAL_TABLET | ORAL | 7 refills | Status: DC

## 2019-09-20 ENCOUNTER — Telehealth: Payer: Self-pay | Admitting: Internal Medicine

## 2019-09-20 MED ORDER — FUROSEMIDE 40 MG PO TABS: 40.0000 mg | ORAL_TABLET | Freq: Two times a day (BID) | ORAL | 3 refills | Status: DC

## 2019-09-20 NOTE — Telephone Encounter (Signed)
Spoke with pt, she reports taking 40 mg of furosemide twice daily works better than 80 mg once daily. New script sent to the pharmacy.

## 2019-09-20 NOTE — Telephone Encounter (Signed)
Pt c/o medication issue:  1. Name of Medication: furosemide (LASIX) 80 MG tablet  2. How are you currently taking this medication (dosage and times per day)? As directed  3. Are you having a reaction (difficulty breathing--STAT)? no  4. What is your medication issue? Patient wants to speak with either Dr. [name] or his nurse in regards to changing the dosage on this medication.

## 2019-11-07 ENCOUNTER — Ambulatory Visit (INDEPENDENT_AMBULATORY_CARE_PROVIDER_SITE_OTHER): Payer: Medicare Other | Admitting: *Deleted

## 2019-11-07 DIAGNOSIS — I495 Sick sinus syndrome: Secondary | ICD-10-CM

## 2019-11-09 LAB — CUP PACEART REMOTE DEVICE CHECK
Battery Remaining Longevity: 34 mo
Battery Voltage: 2.97 V
Brady Statistic RA Percent Paced: 0.47 %
Brady Statistic RV Percent Paced: 98.24 %
Date Time Interrogation Session: 20210825131652
Implantable Lead Implant Date: 20160321
Implantable Lead Implant Date: 20160321
Implantable Lead Location: 753859
Implantable Lead Location: 753860
Implantable Lead Model: 5076
Implantable Lead Model: 5076
Implantable Pulse Generator Implant Date: 20160321
Lead Channel Impedance Value: 361 Ohm
Lead Channel Impedance Value: 475 Ohm
Lead Channel Impedance Value: 494 Ohm
Lead Channel Impedance Value: 513 Ohm
Lead Channel Pacing Threshold Amplitude: 0.5 V
Lead Channel Pacing Threshold Amplitude: 0.875 V
Lead Channel Pacing Threshold Pulse Width: 0.4 ms
Lead Channel Pacing Threshold Pulse Width: 0.4 ms
Lead Channel Sensing Intrinsic Amplitude: 0.375 mV
Lead Channel Sensing Intrinsic Amplitude: 0.375 mV
Lead Channel Sensing Intrinsic Amplitude: 6.5 mV
Lead Channel Sensing Intrinsic Amplitude: 6.5 mV
Lead Channel Setting Pacing Amplitude: 2 V
Lead Channel Setting Pacing Amplitude: 2.5 V
Lead Channel Setting Pacing Pulse Width: 0.4 ms
Lead Channel Setting Sensing Sensitivity: 4 mV

## 2019-11-10 NOTE — Progress Notes (Signed)
Remote pacemaker transmission.   

## 2019-11-18 ENCOUNTER — Other Ambulatory Visit: Payer: Self-pay | Admitting: Internal Medicine

## 2019-11-18 ENCOUNTER — Encounter: Payer: Self-pay | Admitting: Cardiovascular Disease

## 2019-11-18 MED ORDER — APIXABAN 5 MG PO TABS
5.0000 mg | ORAL_TABLET | Freq: Two times a day (BID) | ORAL | 5 refills | Status: DC
Start: 2019-11-18 — End: 2020-05-14

## 2019-11-18 NOTE — Addendum Note (Signed)
Addended by: Derrel Nip B on: 11/18/2019 01:33 PM   Modules accepted: Orders

## 2019-11-18 NOTE — Telephone Encounter (Signed)
*  STAT* If patient is at the pharmacy, call can be transferred to refill team.   1. Which medications need to be refilled? (please list name of each medication and dose if known) apixaban (ELIQUIS) 5 MG TABS tablet  2. Which pharmacy/location (including street and city if local pharmacy) is medication to be sent to? [place], [place]  3. Do they need a 30 day or 90 day supply? 30 day   Patient will be out of medication Sunday.

## 2019-11-18 NOTE — Telephone Encounter (Signed)
error 

## 2019-11-18 NOTE — Telephone Encounter (Signed)
Eliquis 22m refill request received. Patient is 74years old, weight-101.2kg, Crea-0.88 on 03/24/2019, Diagnosis-Afib, and last seen by MAlmyra Deforest PA on 06/21/2019 via Telemedicine. Dose is appropriate based on dosing criteria. Will send in refill to requested pharmacy.

## 2019-12-19 ENCOUNTER — Inpatient Hospital Stay: Admission: RE | Admit: 2019-12-19 | Payer: Medicare Other | Source: Ambulatory Visit

## 2020-02-13 ENCOUNTER — Other Ambulatory Visit: Payer: Self-pay

## 2020-02-13 ENCOUNTER — Encounter: Payer: Self-pay | Admitting: Cardiovascular Disease

## 2020-02-13 ENCOUNTER — Ambulatory Visit: Payer: Medicare Other | Admitting: Cardiovascular Disease

## 2020-02-13 VITALS — BP 122/78 | HR 72 | Ht 64.0 in | Wt 221.4 lb

## 2020-02-13 DIAGNOSIS — Z950 Presence of cardiac pacemaker: Secondary | ICD-10-CM

## 2020-02-13 DIAGNOSIS — E782 Mixed hyperlipidemia: Secondary | ICD-10-CM

## 2020-02-13 DIAGNOSIS — E119 Type 2 diabetes mellitus without complications: Secondary | ICD-10-CM

## 2020-02-13 DIAGNOSIS — Z951 Presence of aortocoronary bypass graft: Secondary | ICD-10-CM | POA: Diagnosis not present

## 2020-02-13 DIAGNOSIS — I2510 Atherosclerotic heart disease of native coronary artery without angina pectoris: Secondary | ICD-10-CM

## 2020-02-13 DIAGNOSIS — I4811 Longstanding persistent atrial fibrillation: Secondary | ICD-10-CM

## 2020-02-13 DIAGNOSIS — Z7901 Long term (current) use of anticoagulants: Secondary | ICD-10-CM

## 2020-02-13 DIAGNOSIS — I10 Essential (primary) hypertension: Secondary | ICD-10-CM | POA: Diagnosis not present

## 2020-02-13 DIAGNOSIS — G4733 Obstructive sleep apnea (adult) (pediatric): Secondary | ICD-10-CM

## 2020-02-13 NOTE — Progress Notes (Signed)
Patient ID: DEANE MELICK, female   DOB: [birth year], 74 y.o.   MRN: [id number]   Primary M.D.: Dr. [name]  HPI: Ms. Erskin is a 74 year-old female who presents for an 73 month follow-up cardiology and sleep evaluation.    Ms. Tercero admits to a history of CAD, diabetes mellitus, peripheral neuropathy, and hypertension.   She underwent CABG revascularization surgery x2 with a maze procedure for atrial fibrillation at that time by Dr. Roxy Manns.  She experienced 2 episodes of palpitations where she felt her heart was temporarily out of rhythm. She denied associated chest tightness. She does note some shortness of breath. . A 2-D echo Doppler study on 10/05/2012 showed normal systolic function with an ejection fraction of 55-60% with mild tissue Doppler abnormality. Her aortic valve was trileaflet and sclerotic with very mild stenosis with a mean gradient of 10 and a peak gradient of 20 mm respectively. There is trivial AR. She also had posterior calcified mitral annulus with mild MR.  A nuclear perfusion study in 2014 revealed mild breast attenuation but otherwise normal perfusion.   She underwent permanent pacemaker insertion by Dr. Crissie Sickles.  She also has had issues with kidney stones and underwent 2 urological procedures in June and July 2016 on her left kidney by Dr. Nicolette Bang.  She developed atrial fibrillation in March and again in June 2017 which occurred at night after drinking cold juice.  She does snore.  She was hospitalized overnight after she presented with AF with a rapid ventricular rate at 129.  She was started on IV diltiazem with ultimate improvement and eventually her oral dose of Cardizem was increased.  Troponins were negative.  An echo during that hospitalization showed an EF of 60-65%.  There was normal wall motion.  There was mild aortic valve stenosis and she had a mean gradient of 10 and peak gradient of 20 with a valve area of 1.35.  She was seen in follow-up  by [name] and was remaining stable.  She saw Dr. [name] on 12/04/2015 and had normal pacemaker function and was maintaining normal sinus rhythm over 99% of the time on her current dose of amiodarone 200 mg.    With her cardiovascular comorbidities and recurrent AF  I recommended that she undergo a sleep study to evaluate for sleep apnea.  This was done on 01/22/2016 and revealed moderate sleep apnea overall within AHI 15.4.  However, sleep apnea was severe during rems sleep at 36 per hour.  Her oxygen nadir was 83%.  She was scheduled to undergo a CPAP titration, but she canceled this appointment in January due to some other conflict.  When I last saw her, she had not yet had her CPAP titration.  I strongly recommended that she have this done.  He Pap titration was done on 07/30/2016 and she was titrated up to 15 cm water pressure.  She had a significant PLMS index of 85.6.  She has been on gabapentin for neuropathy, which will also treat restless legs.  She underwent an echo Doppler study on 07/03/2016 which showed an EF of 60-65%.  She had grade 2 diastolic dysfunction.  She had an upper normal to mildly increased.  The ascending aorta and a thickened mitral valve suggesting moderate stenosis.  There was a mild gradient across her aortic valve.  She had developed mild edema on diltiazem, which improved once this was discontinued.  She denies chest pain.  She continues to take eliquis for anticoagulation  and continues to be on amiodarone 200 mg for her PAF.   She received a new CPAP ResMed AirSense 10 AutoSet unit; setup was 09/30/2016.  She has a ResMed airFit 20 medium size mask.  A download was obtained from August 26 through 12/08/2016.  She is meeting compliance standards and had 93%.  Days of usage and 87% of days with usage greater than 4 hours.  However, she was only averaging 5 hours of sleep per night.  She is set at a 15 cm water pressure.  Her AHI is excellent at 0.1.  Used her machine were  recently due to the fact that she has a broken tooth.  This had resulted in significant teeth pain during the night with her unit on going into this region.  She was recently started on antibiotics by a dentist and will need tooth extraction.  She is sleeping well with her CPAP.  She denies breakthrough snoring.  She denies daytime sleepiness.    She developed an episode of head numbness with lightheadedness and dizziness which resulted in presenting to the emergency room in January 2019.  However, after waiting 4 hours in the waiting room after her blood work was obtained, she ultimately left.  She was seen in the office in follow-up by Fabian Sharp, [place] on April 21, 2017.  At that time her pacemaker was interrogated and showed adequate battery life with normal pacemaker function.  She was atrially paced 99% of the time.  There were no episodes of atrial tachycardia or atrial fibrillation.  She saw Dr. Crissie Sickles for follow-up evaluation and remote pacemaker device check from July 13, 2017 was normal.  She has a history of mixed hyperlipidemia and has had  issues with statin intolerance.  Laboratory in February 2019 showed a total cholesterol to 441, HDL 50, LDL 140, triglycerides 255.  VLDL was 51.  She was seen by Raquel Salvatore Dec[place] and she was given a trial of pravastatin 20 mg to take in addition to her fenofibrate and omega-3 3 fatty acid.    Since I last saw her in May 2019, she has had some issues with bronchitis.  She states that she believes she may be at times retaining some fluid.  She has been using sodium and typically add salt to tomatoes as well as apples.  She also eats food with high sodium content.  She denies any chest pressure.  She is unaware of palpitations.  She had undergone repeat laboratory since the initiation of pravastatin which showed significant improvement in her LDL cholesterol from 140 to 95 but LDL was still increased.  Triglycerides remained elevated at 237 with  the LDL at 47.    I saw her in September 2019.  bBood pressure was improved on furosemide 40 mg daily, losartan 50 mg twice a day and Toprol-XL 100 mg.  Pravastatin was further titrated to 40 mg and she continued to be on Zetia with an LDL of 95.  She was maintaining sinus rhythm on amiodarone on Monday and Saturday and was on Eliquis for anticoagulation.  She continued to use CPAP with 100% compliance.  Amiodarone was stopped due to abnormal pulmonary function tests in October 2019 suggesting severe restrictive physiology including possible pulmonary fibrosis.  She saw Almyra Deforest, [place] in follow-up in January 2020.  She underwent a telemedicine visit with Dr. [name] on June 09, 2018 and was maintaining sinus rhythm.  I  saw her on September 15, 2018 at which time she  felt well.  She underwent bilateral cataract surgery on June 8 and June 22.  She was evaluated by Dr. Valeta Harms regarding amiodarone toxicity and will be undergoing follow-up pulmonary function testing with subsequent office visit on September 23, 2018.  She has had issues with ankle edema that has been mild as well as peripheral neuropathy.  She  had lab work done by her primary physician Dr. Melina Copa.  She was not very compliant with CPAP although her AHI was excellent at 0.2/h at 15 cm water pressure.  Again stressed the importance of continued CPAP therapy particularly with her history of atrial fibrillation and cardiovascular comorbidities.  Since I last saw her, she  was hospitalized in September 2020 with atrial fibrillation with RVR treated with diltiazem drip transition to oral diltiazem.  During that hospitalization diltiazem dose was increased to 480 mg daily.  I last saw her on March 02, 2019.  She was seen by pulmonary on March 01, 2019 and was felt to be in atrial fibrillation with heart rate at 130.  She was added onto my schedule  She was unaware of any atrial fibrillation today and her heart rate is improved.  She denied chest tightness or  pressure.  She denied wheezing.  During that evaluation, her initial ECG revealed atrial fibrillation with a ventricular rate in the 80s but a subsequent ECG suggested possible junctional rhythm with a heart rate in the 60s.  Subsequently, she was evaluated by Dr. [name] and underwent AV node ablation in January 2021 for uncontrolled atrial fibrillation.  She had successful weight loss.  She was seen by him in follow-up in February 2021 and her atrial fibrillation rate was well controlled status post AV node ablation.  Her Medtronic DDD permanent pacemaker was working normally.  Her blood pressure was mildly elevated and she was encouraged to reduce her sodium intake.  She was seen by Almyra Deforest, PA in a telemedicine visit in April 2021.  She denied any chest pain.  Her blood pressure was stable.  Presently, she denies any chest pain.  She denies any palpitations.  She is to undergo laboratory at [place] later this week and she sees Dr. Nadara Mustard to as her primary physician.  She continues to undergo remote pacemaker checks at 14-monthintervals.  She has continued to be on losartan 50 mg, furosemide 40 mg twice a day and metoprolol succinate 100 mg daily with rate control of her AF.  She is on Eliquis 5 mg twice a day for anticoagulation.  She is on fenofibrate 160 mg for her mixed hyperlipidemia and is on metformin 1000 mg twice a day for her diabetes mellitus.  She no longer uses CPAP therapy.  She presents for evaluation.  Past Medical History:  Diagnosis Date  . Asthmatic bronchitis   . CHF (congestive heart failure) ([place] dx[place] 2016  . Coronary atherosclerosis of native coronary artery    a. s/p CABG 2005 Dr ORoxy Manns   . Hyperlipidemia   . Hypertension   . Hypertensive heart disease   . Kidney stones   . Mixed hyperlipidemia   . Nephrolithiasis 06/07/2015  . Paroxysmal atrial fibrillation ([place]    a. s/p Maze in 2005 b. recurrence in March 2016 with >8sec posttermination pauses s/p PPM  implant    . Pneumonia 1950   "double"  . Presence of permanent cardiac pacemaker   . Restrictive lung disease 03/03/2018  . Sleep apnea    "tested; mask ordered; couldn't afford it; will  get it now" (09/04/2015)  . TIA (transient ischemic attack)   . Type II diabetes mellitus ([place])   . Uterine cancer [facility])    age 36 with partial hysterectomy    Past Surgical History:  Procedure Laterality Date  . AV NODE ABLATION N/A 03/28/2019   Procedure: AV NODE ABLATION;  Surgeon: Evans Lance, MD;  Location: [place] CV LAB;  Service: Cardiovascular;  Laterality: N/A;  . CARDIAC CATHETERIZATION  2005  . CORONARY ARTERY BYPASS GRAFT  2005   Dr Roxy Manns with MAZE  . CYSTOSCOPY WITH URETEROSCOPY, STONE BASKETRY AND STENT PLACEMENT Left 09/08/2014   Procedure: CYSTOSCOPY WITH URETEROSCOPY, STONE BASKETRY AND STENT PLACEMENT;  Surgeon: Cleon Gustin, MD;  Location: [facility];  Service: Urology;  Laterality: Left;  . CYSTOSCOPY/URETEROSCOPY/HOLMIUM LASER/STENT PLACEMENT Left 09/15/2014   Procedure: CYSTOSCOPY/RETROGRADE/URETEROSCOPY/HOLMIUM LASER/ STONE EXTRACTION /STENT EXCHANGE;  Surgeon: Cleon Gustin, MD;  Location: [facility];  Service: Urology;  Laterality: Left;  . HOLMIUM LASER APPLICATION Left 8/54/6270   Procedure: HOLMIUM LASER APPLICATION;  Surgeon: Cleon Gustin, MD;  Location: [facility];  Service: Urology;  Laterality: Left;  . HOLMIUM LASER APPLICATION Left 05/20/91   Procedure: HOLMIUM LASER APPLICATION;  Surgeon: Cleon Gustin, MD;  Location: [facility];  Service: Urology;  Laterality: Left;  . INSERT / REPLACE / REMOVE PACEMAKER    . LAPAROSCOPIC CHOLECYSTECTOMY    . 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 VAGINAL HYSTERECTOMY  1975    No Known Allergies  Current Outpatient Medications  Medication Sig Dispense Refill  . acetaminophen (TYLENOL) 500 MG tablet Take 500-1,000 mg by mouth  every 6 (six) hours as needed for moderate pain.    [name] amitriptyline (ELAVIL) 10 MG tablet Take 1 tablet by mouth at bedtime.    [name] apixaban (ELIQUIS) 5 MG TABS tablet Take 1 tablet (5 mg total) by mouth 2 (two) times daily. 60 tablet 5  . aspirin 81 MG tablet Take 81 mg by mouth daily.     . B Complex-Biotin-FA (B-COMPLEX PO) Take 1 capsule by mouth every morning.     [name] BIOTIN 5000 PO Take 1 tablet by mouth daily.    . Cholecalciferol (VITAMIN D3) 1000 UNITS CAPS Take 1,000 Units by mouth daily.    . Coenzyme Q10 (CO Q-10) 200 MG CAPS Take 200 mg by mouth daily.    . diphenhydramine-acetaminophen (TYLENOL PM) 25-500 MG TABS tablet Take 1 tablet by mouth at bedtime as needed (pain/sleep).     . fenofibrate 160 MG tablet TAKE 1 TABLET DAILY 90 tablet 3  . furosemide (LASIX) 40 MG tablet Take 1 tablet (40 mg total) by mouth 2 (two) times daily. 180 tablet 3  . HUMALOG MIX 75/25 KWIKPEN (75-25) 100 UNIT/ML Kwikpen Inject 34-38 Units into the skin in the morning, at noon, and at bedtime.    [name] losartan (COZAAR) 50 MG tablet TAKE 1 TABLET UPTO TWICE A DAY AS DIRECTED 180 tablet 1  . metFORMIN (GLUCOPHAGE) 1000 MG tablet Take 1,000 mg by mouth 2 (two) times daily with a meal.     . metoprolol succinate (TOPROL-XL) 100 MG 24 hr tablet Take 1 tablet daily with or immediatelyfollowing a meal. 90 tablet 3  . potassium chloride (KLOR-CON) 10 MEQ tablet Take 20 mEq by mouth daily.     No current facility-administered medications for this visit.    Socially she is married to [employer]  [place]; she has 2 children one grandchild. She is retired. She completed ninth grade education. Has no history of tobacco use. She is not drink alcohol. She does walk approximately 2-3 times per week.  Family History  Problem Relation Age of Onset  . Hypertension Mother   . Hyperlipidemia Mother   . Heart disease Mother   . Diabetes Father   . Stroke Father   . Heart disease Father   . Hypertension Sister        3 sisters   . Arthritis Brother   . Heart attack Maternal Grandfather   . Stroke Paternal Grandfather   . 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 Valeta Harms Cardiovascular: He history of present illness GI: Negative; No nausea, vomiting, diarrhea, or abdominal pain GU: Positive for  left uteroscopic stone manipulation with laser lithotripsy for left kidney stone Musculoskeletal: Negative; no myalgias, joint pain, or weakness Hematologic/Oncology: Negative; no easy bruising, bleeding Endocrine: Positive for diabetes mellitus Neuro: Positive for peripheral neuropathy Skin: Negative; No rashes or skin lesions Psychiatric: Negative; No behavioral problems, depression Sleep: Positive for OSA, Recently started on CPAP therapy;  no daytime sleepiness, hypersomnolence, bruxism, restless legs, hypnogognic hallucinations, no cataplexy Other comprehensive 14 point system review is negative.   PE BP 122/78   Pulse 72   Ht _0  (1.626 m)   Wt 221 lb 6.4 oz (100.4 kg)   BMI 38.00 kg/m    Repeat blood pressure by me was 120/64  Wt Readings from Last 3 Encounters:  02/13/20 221 lb 6.4 oz (100.4 kg)  06/28/19 223 lb (101.2 kg)  06/21/19 219 lb (99.3 kg)   General: Alert, oriented, no distress.  Skin: normal turgor, no rashes, warm and dry HEENT: Normocephalic, atraumatic. Pupils equal round and reactive to light; sclera anicteric; extraocular muscles intact;  Nose without nasal septal hypertrophy Mouth/Parynx benign; Mallinpatti scale 3 Neck: No JVD, no carotid bruits; normal carotid upstroke Lungs: clear to ausculatation and percussion; no wheezing or rales Chest wall: without tenderness to palpitation Heart: PMI not displaced, RRR, s1 s2 normal, 1/6 systolic murmur, no  diastolic murmur, no rubs, gallops, thrills, or heaves Abdomen: soft, nontender; no hepatosplenomehaly, BS+; abdominal aorta nontender and not dilated by palpation. Back: no CVA tenderness Pulses 2+ Musculoskeletal: full range of motion, normal strength, no joint deformities Extremities: no clubbing cyanosis or edema, Homan's sign negative  Neurologic: grossly nonfocal; Cranial nerves grossly wnl Psychologic: Normal mood and affect   ECG (independently read by me): V paced at 72.  03/02/2019 repeat ECG after prolonged auscultation suggested a regular rhythm: Possible accelerated junctional rhythm at 69 bpm.  Rhythm is regular.  No clear discernible P waves   Initial 03/02/2019 ECG (independently read by me): Atrial fibrillation at 85 bpm.  Rightward axis.  Nonspecific ST changes.  July 2020 ECG (independently read by me): Atrially paced rhythm at 85 bpm; prolonged AV conduction with PR interval 246 ms.  Nonspecific ST changes seconds  September 2018 ECG (independently read by me): Atrially paced at 84 bpm.  Incomplete right bundle branch block.  Anterolateral ST abnormality.  May 2019 ECG (independently read by me): Atrially paced rhythm with ventricular rate at 67 bpm.  Prolonged AV conduction with PR interval 272 ms.  QTC prolongation _1 ms.  Nonspecific ST-T  changes.  September 2018 ECG (independently read by me): Atrially paced rhythm at 83 bpm with prolonged AV conduction with a PR interval 284 ms.  QTc interval 484 ms.  Nonspecific ST changes.  March 2018 ECG (independently read by me): Atrially paced rhythm at 81 bpm.  Prolonged AV conduction with a PR interval at 258 ms.  QTc interval 473 ms.  Nonspecific ST-T changes.  September 2017 ECG (independently read by me): Atrially paced rhythm with a PR interval at 248.  Complete right bundle branch block.  QTc interval 458 ms.  March 2017 ECG (independently read by me):  Atrially paced rhythm with prolonged AV conduction with PR  interval 232 ms.  Nonspecific ST-T changes.  September 2016 ECG (independently read by me): A paced rhythm at 63 with prolonged AV conduction with a PR 214.  QTC normal at 446 ms.  July 2015 ECG (independently read by me): Sinus rhythm with short PR 88 ms.  Nonspecific T changes.  Incomplete right bundle branch block  10/2013 ECG: Sinus rhythm at 69 beats per minute. QTc interval 445 ms.  LABS: BMP Latest Ref Rng & Units 03/24/2019 12/13/2018 12/12/2018  Glucose 65 - 99 mg/dL 238(H) 197(H) 177(H)  BUN 8 - 27 mg/dL _0 Creatinine 0.57 - 1.00 mg/dL 0.88 0.92 0.94  BUN/Creat Ratio 12 - 28 19 - -  Sodium 134 - 144 mmol/L 137 136 138  Potassium 3.5 - 5.2 mmol/L 4.0 3.7 3.5  Chloride 96 - 106 mmol/L 96 99 98  CO2 20 - 29 mmol/L _1 Calcium 8.7 - 10.3 mg/dL 9.8 9.5 9.3   Hepatic Function Latest Ref Rng & Units 12/11/2018 03/19/2018 10/20/2017  Total Protein 6.5 - 8.1 g/dL 7.1 7.0 7.0  Albumin 3.5 - 5.0 g/dL 3.7 4.5 4.3  AST 15 - 41 U/L 50(H) 40 53(H)  ALT 0 - 44 U/L _2 Alk Phosphatase 38 - 126 U/L 55 50 46  Total Bilirubin 0.3 - 1.2 mg/dL 1.0 0.3 0.4  Bilirubin, Direct <=0.2 mg/dL - - -   CBC Latest Ref Rng & Units 03/24/2019 12/12/2018 12/11/2018  WBC 3.4 - 10.8 x10E3/uL 10.9(H) 9.5 9.0  Hemoglobin 11.1 - 15.9 g/dL 12.2 11.4(L) 12.4  Hematocrit 34.0 - 46.6 % 36.5 36.0 37.7  Platelets 150 - 450 x10E3/uL 381 285 282   Lab Results  Component Value Date   MCV 88 03/24/2019   MCV 94.0 12/12/2018   MCV 93.1 12/11/2018   Lab Results  Component Value Date   TSH 2.088 09/05/2015   Lab Results  Component Value Date   HGBA1C 10.9 (H) 12/09/2018   Lipid Panel     Component Value Date/Time   CHOL 188 03/19/2018 0931   TRIG 228 (H) 03/19/2018 0931   HDL 48 03/19/2018 0931   CHOLHDL 3.9 03/19/2018 0931   CHOLHDL 4.7 09/08/2016 0910   VLDL 42 (H) 09/08/2016 0910   LDLCALC 94 03/19/2018 0931   Laboratory from Dr. Hughie Closs office from   February 22,017. Lipid studies  revealed a total cholesterol 186, LDL cholesterol 80, triglycerides 333 and VLDL cholesterol 67. Her glucose was 283.   Laboratory from Dr. Hughie Closs office from 11/27/2014: Fasting glucose 220, BUN 16, creatinine 0.66.  CO2 30.5.  Hemoglobin 12.1, hematocrit 37.3.  Hemoglobin A1c 9.4.  Total cholesterol 204, triglycerides 261, HDL 44, LDL 108, VLDL 52.   RADIOLOGY: No results found.  IMPRESSION:  1. CAD in native artery  2. Hx of CABG 2005   3. Longstanding persistent atrial fibrillation ([place])   4. Essential hypertension   5. Controlled type 2 diabetes mellitus without complication, without long-term current use of insulin ([place])   6. Pacemaker- March 2016    7. Mixed hyperlipidemia   8. Chronic anticoagulation   9. Morbid obesity ([place])   10. OSA (obstructive sleep apnea)     ASSESSMENT AND PLAN:   Ms. Guerry is a 75 year old female who has a history of type 2 diabetes mellitus and is status post CABG surgery x2 by Dr. Roxy Manns in 2005. She has a history of atrial fibrillation and underwent a Maze procedure at the time of her surgery.  She is status post permanent pacemaker insertion by Dr. [name].  She had been on amiodarone which ultimately was weaned and DC'd due to concerns for amiodarone toxicity and interstitial lung disease.  She developed recurrent episodes of atrial fibrillation in all underwent AV node ablation by Dr. [name] in January 2021.  Her ventricular rate has been controlled.  Her ECG today shows ventricular paced rhythm with underlying AF.  She has continued to be on rate control regimen consisting of metoprolol succinate 100 mg daily.  Her blood pressure today is stable on metoprolol, losartan 50 mg twice a day in addition to furosemide 40 mg twice a day.  She has a peripheral neuropathy for which she takes amitriptyline.  She is on Eliquis for anticoagulation and is without bleeding.  She is diabetic on insulin and Metformin.  She will undergo repeat laboratory this  week at [place].  Target LDL is less than 70.  She apparently stopped using CPAP and has no interest in reinstitution.  She continues to undergo remote pacemaker checks at 65-monthintervals.  I will see her in 1 year for reevaluation or sooner as needed.   TTroy Sine MD, [facility]12/03/2019 12:55 PM

## 2020-02-13 NOTE — Patient Instructions (Signed)
Medication Instructions:  No changes *If you need a refill on your cardiac medications before your next appointment, please call your pharmacy*   Lab Work: Labs at PCP If you have labs (blood work) drawn today and your tests are completely normal, you will receive your results only by: [name] MyChart Message (if you have MyChart) OR . A paper copy in the mail If you have any lab test that is abnormal or we need to change your treatment, we will call you to review the results.   Testing/Procedures: None ordered   Follow-Up: At [facility], you and your health needs are our priority.  As part of our continuing mission to provide you with exceptional heart care, we have created designated Provider Care Teams.  These Care Teams include your primary Cardiologist (physician) and Advanced Practice Providers (APPs -  Physician Assistants and Nurse Practitioners) who all work together to provide you with the care you need, when you need it.  We recommend signing up for the patient portal called "MyChart".  Sign up information is provided on this After Visit Summary.  MyChart is used to connect with patients for Virtual Visits (Telemedicine).  Patients are able to view lab/test results, encounter notes, upcoming appointments, etc.  Non-urgent messages can be sent to your provider as well.   To learn more about what you can do with MyChart, go to [email or web address].    Your next appointment:   12 month(s)  The format for your next appointment:   In Person  Provider:   Shelva Majestic, MD   Other Instructions None

## 2020-02-15 ENCOUNTER — Encounter: Payer: Self-pay | Admitting: Cardiovascular Disease

## 2020-02-23 ENCOUNTER — Ambulatory Visit (INDEPENDENT_AMBULATORY_CARE_PROVIDER_SITE_OTHER): Payer: Medicare Other

## 2020-02-23 DIAGNOSIS — I495 Sick sinus syndrome: Secondary | ICD-10-CM | POA: Diagnosis not present

## 2020-02-23 LAB — CUP PACEART REMOTE DEVICE CHECK
Battery Remaining Longevity: 31 mo
Battery Voltage: 2.97 V
Brady Statistic RA Percent Paced: 0.36 %
Brady Statistic RV Percent Paced: 98.48 %
Date Time Interrogation Session: 20211209124625
Implantable Lead Implant Date: 20160321
Implantable Lead Implant Date: 20160321
Implantable Lead Location: 753859
Implantable Lead Location: 753860
Implantable Lead Model: 5076
Implantable Lead Model: 5076
Implantable Pulse Generator Implant Date: 20160321
Lead Channel Impedance Value: 361 Ohm
Lead Channel Impedance Value: 475 Ohm
Lead Channel Impedance Value: 532 Ohm
Lead Channel Impedance Value: 570 Ohm
Lead Channel Pacing Threshold Amplitude: 0.5 V
Lead Channel Pacing Threshold Amplitude: 0.875 V
Lead Channel Pacing Threshold Pulse Width: 0.4 ms
Lead Channel Pacing Threshold Pulse Width: 0.4 ms
Lead Channel Sensing Intrinsic Amplitude: 0.375 mV
Lead Channel Sensing Intrinsic Amplitude: 0.375 mV
Lead Channel Sensing Intrinsic Amplitude: 13.875 mV
Lead Channel Sensing Intrinsic Amplitude: 13.875 mV
Lead Channel Setting Pacing Amplitude: 2 V
Lead Channel Setting Pacing Amplitude: 2.5 V
Lead Channel Setting Pacing Pulse Width: 0.4 ms
Lead Channel Setting Sensing Sensitivity: 4 mV

## 2020-03-07 NOTE — Progress Notes (Signed)
Remote pacemaker transmission.

## 2020-03-08 ENCOUNTER — Other Ambulatory Visit: Payer: Self-pay | Admitting: Cardiovascular Disease

## 2020-05-03 ENCOUNTER — Encounter: Payer: Self-pay | Admitting: Internal Medicine

## 2020-05-03 ENCOUNTER — Other Ambulatory Visit: Payer: Self-pay

## 2020-05-03 ENCOUNTER — Ambulatory Visit: Payer: Medicare Other | Admitting: Internal Medicine

## 2020-05-03 VITALS — BP 140/78 | HR 98 | Ht 64.0 in | Wt 227.6 lb

## 2020-05-03 DIAGNOSIS — Z950 Presence of cardiac pacemaker: Secondary | ICD-10-CM

## 2020-05-03 DIAGNOSIS — I442 Atrioventricular block, complete: Secondary | ICD-10-CM | POA: Diagnosis not present

## 2020-05-03 DIAGNOSIS — I10 Essential (primary) hypertension: Secondary | ICD-10-CM | POA: Diagnosis not present

## 2020-05-03 DIAGNOSIS — I480 Paroxysmal atrial fibrillation: Secondary | ICD-10-CM | POA: Diagnosis not present

## 2020-05-03 NOTE — Progress Notes (Signed)
HPI Ms. Dorgan returns today for followup after undergoing AV node ablation over one year ago for uncontrolled atrial fib. In the interim, she has done well with her HR well controlled. She denies palpitations/chest pain, or sob. No worsening edema. She had lost over 40 lbs but gained back 15 lbs.   No Known Allergies   Current Outpatient Medications  Medication Sig Dispense Refill  . acetaminophen (TYLENOL) 500 MG tablet Take 500-1,000 mg by mouth every 6 (six) hours as needed for moderate pain.    [name] amitriptyline (ELAVIL) 10 MG tablet Take 1 tablet by mouth at bedtime.    [name] apixaban (ELIQUIS) 5 MG TABS tablet Take 1 tablet (5 mg total) by mouth 2 (two) times daily. 60 tablet 5  . aspirin 81 MG tablet Take 81 mg by mouth daily.     . B Complex-Biotin-FA (B-COMPLEX PO) Take 1 capsule by mouth every morning.     [name] BIOTIN 5000 PO Take 1 tablet by mouth daily.    . Cholecalciferol (VITAMIN D3) 1000 UNITS CAPS Take 1,000 Units by mouth daily.    . Coenzyme Q10 (CO Q-10) 200 MG CAPS Take 200 mg by mouth daily.    . diphenhydramine-acetaminophen (TYLENOL PM) 25-500 MG TABS tablet Take 1 tablet by mouth at bedtime as needed (pain/sleep).     . fenofibrate 160 MG tablet TAKE 1 TABLET DAILY 90 tablet 3  . furosemide (LASIX) 40 MG tablet Take 1 tablet (40 mg total) by mouth 2 (two) times daily. 180 tablet 3  . HUMALOG MIX 75/25 KWIKPEN (75-25) 100 UNIT/ML Kwikpen Inject 34-38 Units into the skin in the morning, at noon, and at bedtime.    [name] losartan (COZAAR) 50 MG tablet TAKE 1 TABLET UPTO TWICE A DAY AS DIRECTED 180 tablet 1  . metFORMIN (GLUCOPHAGE) 1000 MG tablet Take 1,000 mg by mouth 2 (two) times daily with a meal.     . metoprolol succinate (TOPROL-XL) 100 MG 24 hr tablet TAKE 1 TABLET ONCE A DAY WITH A MEAL 90 tablet 0  . potassium chloride (KLOR-CON) 10 MEQ tablet Take 20 mEq by mouth daily.     No current facility-administered medications for this visit.     Past Medical  History:  Diagnosis Date  . Asthmatic bronchitis   . CHF (congestive heart failure) ([place]) dx'd 2016  . Coronary atherosclerosis of native coronary artery    a. s/p CABG 2005 Dr Roxy Manns    . Hyperlipidemia   . Hypertension   . Hypertensive heart disease   . Kidney stones   . Mixed hyperlipidemia   . Nephrolithiasis 06/07/2015  . Paroxysmal atrial fibrillation ([place])    a. s/p Maze in 2005 b. recurrence in March 2016 with >8sec posttermination pauses s/p PPM implant    . Pneumonia 1950   "double"  . Presence of permanent cardiac pacemaker   . Restrictive lung disease 03/03/2018  . Sleep apnea    "tested; mask ordered; couldn't afford it; will get it now" (09/04/2015)  . TIA (transient ischemic attack)   . Type II diabetes mellitus ([place])   . Uterine cancer [facility])    age 22 with partial hysterectomy    ROS:   All systems reviewed and negative except as noted in the HPI.   Past Surgical History:  Procedure Laterality Date  . AV NODE ABLATION N/A 03/28/2019   Procedure: AV NODE ABLATION;  Surgeon: Evans Lance, MD;  Location: [place] [facility];  Service: Cardiovascular;  Laterality: N/A;  . CARDIAC CATHETERIZATION  2005  . CORONARY ARTERY BYPASS GRAFT  2005   Dr Roxy Manns with MAZE  . CYSTOSCOPY WITH URETEROSCOPY, STONE BASKETRY AND STENT PLACEMENT Left 09/08/2014   Procedure: CYSTOSCOPY WITH URETEROSCOPY, STONE BASKETRY AND STENT PLACEMENT;  Surgeon: Cleon Gustin, MD;  Location: [facility];  Service: Urology;  Laterality: Left;  . CYSTOSCOPY/URETEROSCOPY/HOLMIUM LASER/STENT PLACEMENT Left 09/15/2014   Procedure: CYSTOSCOPY/RETROGRADE/URETEROSCOPY/HOLMIUM LASER/ STONE EXTRACTION /STENT EXCHANGE;  Surgeon: Cleon Gustin, MD;  Location: [facility];  Service: Urology;  Laterality: Left;  . HOLMIUM LASER APPLICATION Left 6/80/8811   Procedure: HOLMIUM LASER APPLICATION;  Surgeon: Cleon Gustin, MD;  Location: [facility];  Service: Urology;  Laterality: Left;  . HOLMIUM LASER APPLICATION  Left 0/05/1592   Procedure: HOLMIUM LASER APPLICATION;  Surgeon: Cleon Gustin, MD;  Location: [facility];  Service: Urology;  Laterality: Left;  . INSERT / REPLACE / REMOVE PACEMAKER    . LAPAROSCOPIC CHOLECYSTECTOMY    . 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 VAGINAL HYSTERECTOMY  1975     Family History  Problem Relation Age of Onset  . Hypertension Mother   . Hyperlipidemia Mother   . Heart disease Mother   . Diabetes Father   . Stroke Father   . Heart disease Father   . Hypertension Sister        3 sisters  . Arthritis Brother   . Heart attack Maternal Grandfather   . Stroke Paternal Grandfather   . Hypertension Daughter      Social History   Socioeconomic History  . Marital status: Married    Spouse name: Not on file  . Number of children: Not on file  . Years of education: Not on file  . Highest education level: Not on file  Occupational History  . Not on file  Tobacco Use  . Smoking status: Never Smoker  . Smokeless tobacco: Never Used  Vaping Use  . Vaping Use: Never used  Substance and Sexual Activity  . Alcohol use: No  . Drug use: No  . Sexual activity: Not on file  Other Topics Concern  . Not on file  Social History Narrative  . Not on file   Social Determinants of Health   Financial Resource Strain: Not on file  Food Insecurity: Not on file  Transportation Needs: Not on file  Physical Activity: Not on file  Stress: Not on file  Social Connections: Not on file  Intimate Partner Violence: Not on file     BP 140/78   Pulse 98   Ht _0  (1.626 m)   Wt 227 lb 9.6 oz (103.2 kg)   SpO2 98%   BMI 39.07 kg/m   Physical Exam:  obese appearing NAD HEENT: Unremarkable Neck:  No JVD, no thyromegally Lymphatics:  No adenopathy Back:  No CVA tenderness Lungs:  Clear with no wheezes HEART:  Regular rate rhythm, no murmurs, no rubs, no clicks Abd:   soft, positive bowel sounds, no organomegally, no rebound, no guarding Ext:  2 plus pulses, no edema, no cyanosis, no clubbing Skin:  No rashes no nodules Neuro:  CN II through XII intact, motor grossly intact  DEVICE  Normal device function.  See PaceArt for details.   Assess/Plan: 1. Atrial fib - her VR is now well controlled, s/p AV node ablation.  2. PPM - her Medtronic DDD PM  is working normally. We will recheck in several months. 3. HTN - her bp is minimally elevated. She is encouraged to reduce her sodium intake. 4. Obesity - she has lost 40 lbs but gained a bunch back and I have encouraged her to work on weight loss. The goal would be to get under 200 lbs.  Carleene Overlie Yuvia Plant,MD

## 2020-05-03 NOTE — Patient Instructions (Addendum)
Medication Instructions:  Your physician has recommended you make the following change in your medication:  1.  STOP aspirin  Labwork: None ordered.  Testing/Procedures: None ordered.  Follow-Up: Your physician wants you to follow-up in: one year with Cristopher Peru, MD or one of the following Advanced Practice Providers on your designated Care Team:    Chanetta Marshall, NP  Tommye Standard, PA-C  Legrand Como "Jonni Sanger" [place], [place]  Remote monitoring is used to monitor your Pacemaker from home. This monitoring reduces the number of office visits required to check your device to one time per year. It allows [place] to keep an eye on the functioning of your device to ensure it is working properly. You are scheduled for a device check from home on 05/24/2020. You may send your transmission at any time that day. If you have a wireless device, the transmission will be sent automatically. After your physician reviews your transmission, you will receive a postcard with your next transmission date.  Any Other Special Instructions Will Be Listed Below (If Applicable).  If you need a refill on your cardiac medications before your next appointment, please call your pharmacy.

## 2020-05-14 ENCOUNTER — Other Ambulatory Visit: Payer: Self-pay | Admitting: Cardiovascular Disease

## 2020-05-14 NOTE — Telephone Encounter (Signed)
Prescription refill request for Eliquis received. Indication: atrial fibrillation Last office visit:2/22  [name] Scr: needs labs Age: 75 Weight: 103.2 kg  Prescription refilled

## 2020-05-24 ENCOUNTER — Ambulatory Visit (INDEPENDENT_AMBULATORY_CARE_PROVIDER_SITE_OTHER): Payer: Medicare Other

## 2020-05-24 DIAGNOSIS — I495 Sick sinus syndrome: Secondary | ICD-10-CM | POA: Diagnosis not present

## 2020-06-01 NOTE — Progress Notes (Signed)
Remote pacemaker transmission.   

## 2020-06-06 ENCOUNTER — Other Ambulatory Visit: Payer: Self-pay

## 2020-06-06 ENCOUNTER — Ambulatory Visit (INDEPENDENT_AMBULATORY_CARE_PROVIDER_SITE_OTHER): Payer: Medicare Other | Admitting: Otolaryngology

## 2020-06-06 ENCOUNTER — Ambulatory Visit (INDEPENDENT_AMBULATORY_CARE_PROVIDER_SITE_OTHER): Payer: Medicare Other

## 2020-06-06 VITALS — Temp 97.2°F

## 2020-06-06 DIAGNOSIS — I442 Atrioventricular block, complete: Secondary | ICD-10-CM

## 2020-06-06 DIAGNOSIS — H6123 Impacted cerumen, bilateral: Secondary | ICD-10-CM

## 2020-06-06 DIAGNOSIS — H903 Sensorineural hearing loss, bilateral: Secondary | ICD-10-CM | POA: Diagnosis not present

## 2020-06-06 LAB — CUP PACEART REMOTE DEVICE CHECK
Battery Remaining Longevity: 28 mo
Battery Voltage: 2.96 V
Brady Statistic RA Percent Paced: 0.38 %
Brady Statistic RV Percent Paced: 98.64 %
Date Time Interrogation Session: 20220322151142
Implantable Lead Implant Date: 20160321
Implantable Lead Implant Date: 20160321
Implantable Lead Location: 753859
Implantable Lead Location: 753860
Implantable Lead Model: 5076
Implantable Lead Model: 5076
Implantable Pulse Generator Implant Date: 20160321
Lead Channel Impedance Value: 361 Ohm
Lead Channel Impedance Value: 475 Ohm
Lead Channel Impedance Value: 494 Ohm
Lead Channel Impedance Value: 532 Ohm
Lead Channel Pacing Threshold Amplitude: 0.625 V
Lead Channel Pacing Threshold Amplitude: 0.875 V
Lead Channel Pacing Threshold Pulse Width: 0.4 ms
Lead Channel Pacing Threshold Pulse Width: 0.4 ms
Lead Channel Sensing Intrinsic Amplitude: 0.25 mV
Lead Channel Sensing Intrinsic Amplitude: 0.25 mV
Lead Channel Sensing Intrinsic Amplitude: 6.75 mV
Lead Channel Sensing Intrinsic Amplitude: 6.75 mV
Lead Channel Setting Pacing Amplitude: 2 V
Lead Channel Setting Pacing Amplitude: 2.5 V
Lead Channel Setting Pacing Pulse Width: 0.4 ms
Lead Channel Setting Sensing Sensitivity: 4 mV

## 2020-06-06 NOTE — Progress Notes (Signed)
HPI: [name] is a 75 y.o. female who presents is referred by her PCP for evaluation of wax buildup predominate in the right ear as well as decreased hearing in both ears..  Past Medical History:  Diagnosis Date  . Asthmatic bronchitis   . CHF (congestive heart failure) ([place]) dx'd 2016  . Coronary atherosclerosis of native coronary artery    a. s/p CABG 2005 Dr Roxy Manns    . Hyperlipidemia   . Hypertension   . Hypertensive heart disease   . Kidney stones   . Mixed hyperlipidemia   . Nephrolithiasis 06/07/2015  . Paroxysmal atrial fibrillation ([place])    a. s/p Maze in 2005 b. recurrence in March 2016 with >8sec posttermination pauses s/p PPM implant    . Pneumonia 1950   "double"  . Presence of permanent cardiac pacemaker   . Restrictive lung disease 03/03/2018  . Sleep apnea    "tested; mask ordered; couldn't afford it; will get it now" (09/04/2015)  . TIA (transient ischemic attack)   . Type II diabetes mellitus ([place])   . Uterine cancer [facility])    age 41 with partial hysterectomy   Past Surgical History:  Procedure Laterality Date  . AV NODE ABLATION N/A 03/28/2019   Procedure: AV NODE ABLATION;  Surgeon: Evans Lance, MD;  Location: [facility] CV [facility];  Service: Cardiovascular;  Laterality: N/A;  . CARDIAC CATHETERIZATION  2005  . CORONARY ARTERY BYPASS GRAFT  2005   Dr Roxy Manns with MAZE  . CYSTOSCOPY WITH URETEROSCOPY, STONE BASKETRY AND STENT PLACEMENT Left 09/08/2014   Procedure: CYSTOSCOPY WITH URETEROSCOPY, STONE BASKETRY AND STENT PLACEMENT;  Surgeon: Cleon Gustin, MD;  [facility];  Service: Urology;  Laterality: Left;  . CYSTOSCOPY/URETEROSCOPY/HOLMIUM LASER/STENT PLACEMENT Left 09/15/2014   Procedure: CYSTOSCOPY/RETROGRADE/URETEROSCOPY/HOLMIUM LASER/ STONE EXTRACTION /STENT EXCHANGE;  Surgeon: Cleon Gustin, MD;  [facility];  Service: Urology;  Laterality: Left;  . HOLMIUM LASER APPLICATION Left 7/90/2409   Procedure: HOLMIUM LASER APPLICATION;   Surgeon: Cleon Gustin, MD;  [facility];  Service: Urology;  Laterality: Left;  . HOLMIUM LASER APPLICATION Left 09/17/5327   Procedure: HOLMIUM LASER APPLICATION;  Surgeon: Cleon Gustin, MD;  [facility];  Service: Urology;  Laterality: Left;  . INSERT / REPLACE / REMOVE PACEMAKER    . LAPAROSCOPIC CHOLECYSTECTOMY    . 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 VAGINAL HYSTERECTOMY  1975   Social History   Socioeconomic History  . Marital status: Married    Spouse name: Not on file  . Number of children: Not on file  . Years of education: Not on file  . Highest education level: Not on file  Occupational History  . Not on file  Tobacco Use  . Smoking status: Never Smoker  . Smokeless tobacco: Never Used  Vaping Use  . Vaping Use: Never used  Substance and Sexual Activity  . Alcohol use: No  . Drug use: No  . Sexual activity: Not on file  Other Topics Concern  . Not on file  Social History Narrative  . Not on file   Social Determinants of Health   Financial Resource Strain: Not on file  Food Insecurity: Not on file  Transportation Needs: Not on file  Physical Activity: Not on file  Stress: Not on file  Social Connections: Not on file   Family History  Problem Relation Age of Onset  .  Hypertension Mother   . Hyperlipidemia Mother   . Heart disease Mother   . Diabetes Father   . Stroke Father   . Heart disease Father   . Hypertension Sister        3 sisters  . Arthritis Brother   . Heart attack Maternal Grandfather   . Stroke Paternal Grandfather   . Hypertension Daughter    No Known Allergies Prior to Admission medications   Medication Sig Start Date End Date Taking? Authorizing Provider  acetaminophen (TYLENOL) 500 MG tablet Take 500-1,000 mg by mouth every 6 (six) hours as needed for moderate pain.    [provider]  amitriptyline (ELAVIL)  10 MG tablet Take 1 tablet by mouth at bedtime. 12/06/19   [provider]  B Complex-Biotin-FA (B-COMPLEX PO) Take 1 capsule by mouth every morning.     [provider]  BIOTIN 5000 PO Take 1 tablet by mouth daily.    [provider]  Cholecalciferol (VITAMIN D3) 1000 UNITS CAPS Take 1,000 Units by mouth daily.    [provider]  Coenzyme Q10 (CO Q-10) 200 MG CAPS Take 200 mg by mouth daily.    [provider]  diphenhydramine-acetaminophen (TYLENOL PM) 25-500 MG TABS tablet Take 1 tablet by mouth at bedtime as needed (pain/sleep).     [provider]  ELIQUIS 5 MG TABS tablet TAKE  (1)  TABLET TWICE A DAY. 05/14/20   Troy Sine, MD  fenofibrate 160 MG tablet TAKE 1 TABLET DAILY 06/07/19   Troy Sine, MD  furosemide (LASIX) 40 MG tablet Take 1 tablet (40 mg total) by mouth 2 (two) times daily. 09/20/19   Troy Sine, MD  HUMALOG MIX 75/25 KWIKPEN (75-25) 100 UNIT/ML Kwikpen Inject 34-38 Units into the skin in the morning, at noon, and at bedtime. 01/19/20   [provider]  losartan (COZAAR) 50 MG tablet TAKE 1 TABLET UPTO TWICE A DAY AS DIRECTED 09/02/19   Troy Sine, MD  metFORMIN (GLUCOPHAGE) 1000 MG tablet Take 1,000 mg by mouth 2 (two) times daily with a meal.     [provider]  metoprolol succinate (TOPROL-XL) 100 MG 24 hr tablet TAKE 1 TABLET ONCE A DAY WITH A MEAL 03/08/20   Troy Sine, MD  potassium chloride (KLOR-CON) 10 MEQ tablet Take 20 mEq by mouth daily. 03/23/19   [provider]     Positive ROS: Otherwise negative  All other systems have been reviewed and were otherwise negative with the exception of those mentioned in the HPI and as above.  Physical Exam: Constitutional: Alert, well-appearing, no acute distress Ears: External ears without lesions or tenderness.  She had minimal wax buildup in the left ear that was cleaned with suction and curette.  On the right side she had  wax adjacent to the right TM that was deep within the ear canal and was removed with hydroperoxide suction and curettes.  The TMs were clear bilaterally. Nasal: External nose without lesions. Septum with minimal deformity and mild rhinitis. Clear nasal passages otherwise. Oral: Lips and gums without lesions. Tongue and palate mucosa without lesions. Posterior oropharynx clear. Neck: No palpable adenopathy or masses Respiratory: Breathing comfortably  Skin: No facial/neck lesions or rash noted.  Audiologic testing demonstrated downsloping moderate to severe hearing loss in both ears.  SRT's were 45 dB on the right and 40 dB on the left.  She had type A tympanograms bilaterally.  She felt like she heard  much better on the right side after removing the wax.  Cerumen impaction removal  Date/Time: 06/06/2020 10:26 AM Performed by: Rozetta Nunnery, MD Authorized by: Rozetta Nunnery, MD   Consent:    Consent obtained:  Verbal   Consent given by:  Patient   Risks discussed:  Pain and bleeding Procedure details:    Location:  L ear and R ear   Procedure type: curette and suction   Post-procedure details:    Inspection:  TM intact and canal normal   Hearing quality:  Improved   Patient tolerance of procedure:  Tolerated well, no immediate complications Comments:     TMs are clear bilaterally.    Assessment: Patient with wax buildup in both ears.  On the right side it was adjacent to the TM and causing obstruction of the hearing. 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  Plan: Would recommend obtaining hearing aids to help with her hearing as she has moderate hearing loss in both ears. Cautioned her about not using Q-tips in her ears as the wax in the right ear was pushed adjacent to the TM. She will follow-up as needed.   Radene Journey, MD   CC:

## 2020-06-08 ENCOUNTER — Other Ambulatory Visit: Payer: Self-pay | Admitting: Cardiovascular Disease

## 2020-06-08 MED ORDER — APIXABAN 5 MG PO TABS: ORAL_TABLET | ORAL | 5 refills | Status: DC

## 2020-06-08 NOTE — Telephone Encounter (Signed)
Prescription refill request for Eliquis received. Indication: a fib Last office visit:  05/03/20  Scr: 0.88 Age: 75 Weight: 103kg

## 2020-06-15 NOTE — Progress Notes (Signed)
Remote pacemaker transmission.

## 2020-06-27 ENCOUNTER — Telehealth: Payer: Self-pay

## 2020-06-27 NOTE — Telephone Encounter (Signed)
Patient daughter called in and states yesterday 06/26/2020 her mom didn't feel well, she felt very weak and she is going to send in a transmission to see if anything showed up. Patient daughter understands that a nurse will call back once transmission is received

## 2020-06-27 NOTE — Telephone Encounter (Signed)
Manual PM transmission received.  Normal device function.  Pt in chronic AF, V rates controlled.  Nothing showing on PM report to explain pt symptoms.    Attempted to callback pt daughter [name] (DPR on file).  No answer.  Left detailed message advising PM report hows normal function, nothing to explain symptoms.  Can call back if further assistance needed, stressed report to Ed for worsening condition.

## 2020-07-05 ENCOUNTER — Encounter (INDEPENDENT_AMBULATORY_CARE_PROVIDER_SITE_OTHER): Payer: Self-pay

## 2020-08-14 ENCOUNTER — Other Ambulatory Visit: Payer: Self-pay | Admitting: Cardiovascular Disease

## 2020-09-12 ENCOUNTER — Ambulatory Visit (INDEPENDENT_AMBULATORY_CARE_PROVIDER_SITE_OTHER): Payer: Medicare Other

## 2020-09-12 DIAGNOSIS — I442 Atrioventricular block, complete: Secondary | ICD-10-CM

## 2020-09-12 LAB — CUP PACEART REMOTE DEVICE CHECK
Battery Remaining Longevity: 25 mo
Battery Voltage: 2.95 V
Brady Statistic RA Percent Paced: 0.47 %
Brady Statistic RV Percent Paced: 98.97 %
Date Time Interrogation Session: 20220629142110
Implantable Lead Implant Date: 20160321
Implantable Lead Implant Date: 20160321
Implantable Lead Location: 753859
Implantable Lead Location: 753860
Implantable Lead Model: 5076
Implantable Lead Model: 5076
Implantable Pulse Generator Implant Date: 20160321
Lead Channel Impedance Value: 361 Ohm
Lead Channel Impedance Value: 475 Ohm
Lead Channel Impedance Value: 475 Ohm
Lead Channel Impedance Value: 494 Ohm
Lead Channel Pacing Threshold Amplitude: 0.625 V
Lead Channel Pacing Threshold Amplitude: 0.875 V
Lead Channel Pacing Threshold Pulse Width: 0.4 ms
Lead Channel Pacing Threshold Pulse Width: 0.4 ms
Lead Channel Sensing Intrinsic Amplitude: 0.375 mV
Lead Channel Sensing Intrinsic Amplitude: 0.375 mV
Lead Channel Sensing Intrinsic Amplitude: 5.5 mV
Lead Channel Sensing Intrinsic Amplitude: 5.5 mV
Lead Channel Setting Pacing Amplitude: 2 V
Lead Channel Setting Pacing Amplitude: 2.5 V
Lead Channel Setting Pacing Pulse Width: 0.4 ms
Lead Channel Setting Sensing Sensitivity: 4 mV

## 2020-10-02 NOTE — Progress Notes (Signed)
Remote pacemaker transmission.   

## 2020-11-13 ENCOUNTER — Other Ambulatory Visit: Payer: Self-pay | Admitting: Cardiovascular Disease

## 2020-11-16 ENCOUNTER — Other Ambulatory Visit: Payer: Self-pay | Admitting: Cardiovascular Disease

## 2020-12-12 ENCOUNTER — Ambulatory Visit (INDEPENDENT_AMBULATORY_CARE_PROVIDER_SITE_OTHER): Payer: Medicare Other

## 2020-12-12 DIAGNOSIS — I442 Atrioventricular block, complete: Secondary | ICD-10-CM | POA: Diagnosis not present

## 2020-12-13 LAB — CUP PACEART REMOTE DEVICE CHECK
Battery Remaining Longevity: 24 mo
Battery Voltage: 2.94 V
Brady Statistic RA Percent Paced: 0.54 %
Brady Statistic RV Percent Paced: 98.77 %
Date Time Interrogation Session: 20220928151759
Implantable Lead Implant Date: 20160321
Implantable Lead Implant Date: 20160321
Implantable Lead Location: 753859
Implantable Lead Location: 753860
Implantable Lead Model: 5076
Implantable Lead Model: 5076
Implantable Pulse Generator Implant Date: 20160321
Lead Channel Impedance Value: 361 Ohm
Lead Channel Impedance Value: 456 Ohm
Lead Channel Impedance Value: 475 Ohm
Lead Channel Impedance Value: 494 Ohm
Lead Channel Pacing Threshold Amplitude: 0.5 V
Lead Channel Pacing Threshold Amplitude: 0.875 V
Lead Channel Pacing Threshold Pulse Width: 0.4 ms
Lead Channel Pacing Threshold Pulse Width: 0.4 ms
Lead Channel Sensing Intrinsic Amplitude: 0.375 mV
Lead Channel Sensing Intrinsic Amplitude: 0.375 mV
Lead Channel Sensing Intrinsic Amplitude: 5.5 mV
Lead Channel Sensing Intrinsic Amplitude: 5.5 mV
Lead Channel Setting Pacing Amplitude: 2 V
Lead Channel Setting Pacing Amplitude: 2.5 V
Lead Channel Setting Pacing Pulse Width: 0.4 ms
Lead Channel Setting Sensing Sensitivity: 4 mV

## 2020-12-19 NOTE — Progress Notes (Signed)
Remote pacemaker transmission.

## 2021-01-14 ENCOUNTER — Other Ambulatory Visit: Payer: Self-pay | Admitting: Cardiovascular Disease

## 2021-01-14 NOTE — Telephone Encounter (Signed)
Prescription refill request for Eliquis received. Indication:atrial fib Last office visit:2/22 ZRA:QTMAU labs Age: 75 Weight:103.2 kg  Prescription refilled

## 2021-02-11 ENCOUNTER — Other Ambulatory Visit: Payer: Self-pay | Admitting: Cardiovascular Disease

## 2021-02-13 NOTE — Telephone Encounter (Deleted)
Prescription refill request for Eliquis received. Indication: Afib  Last office visit:05/03/20 [name])  Scr: 0.88 (03/24/19) Age: 75 Weight: 103.2kg  Pt labs overdue.

## 2021-02-13 NOTE — Telephone Encounter (Signed)
Prescription refill request for Eliquis received. Indication: Afib  Last office visit:05/03/20 [name])  Scr: 0.88 (03/24/19) Age: 75 Weight: 103.2kg   Pt labs overdue. Called PCP and LMOM to determine if labs have been completed by Dr Ardeth Perfect. Awaiting call back.

## 2021-02-14 NOTE — Telephone Encounter (Signed)
*  STAT* If patient is at the pharmacy, call can be transferred to refill team.   1. Which medications need to be refilled? (please list name of each medication and dose if known) Eliquis and Fenofibrate  2. Which pharmacy/location (including street and city if local pharmacy) is medication to be sent to?  [address] [place]  3. Do they need a 30 day or 90 day supply? Enough until her appointment on 02-25-21

## 2021-02-14 NOTE — Telephone Encounter (Signed)
Prescription refill request for Eliquis received. Indication:Afib Last office visit:2/22 RFV:OHKGO labs Age: 75 Weight:103.2 kg  Prescription refilled

## 2021-02-25 ENCOUNTER — Encounter: Payer: Self-pay | Admitting: Cardiovascular Disease

## 2021-02-25 ENCOUNTER — Ambulatory Visit: Payer: Medicare Other | Admitting: Cardiovascular Disease

## 2021-02-25 ENCOUNTER — Other Ambulatory Visit: Payer: Self-pay

## 2021-02-25 VITALS — BP 120/70 | HR 73 | Ht 64.0 in | Wt 230.0 lb

## 2021-02-25 DIAGNOSIS — E782 Mixed hyperlipidemia: Secondary | ICD-10-CM

## 2021-02-25 DIAGNOSIS — G4733 Obstructive sleep apnea (adult) (pediatric): Secondary | ICD-10-CM | POA: Diagnosis not present

## 2021-02-25 DIAGNOSIS — Z950 Presence of cardiac pacemaker: Secondary | ICD-10-CM

## 2021-02-25 DIAGNOSIS — R031 Nonspecific low blood-pressure reading: Secondary | ICD-10-CM

## 2021-02-25 DIAGNOSIS — I5032 Chronic diastolic (congestive) heart failure: Secondary | ICD-10-CM | POA: Diagnosis not present

## 2021-02-25 DIAGNOSIS — Z951 Presence of aortocoronary bypass graft: Secondary | ICD-10-CM

## 2021-02-25 DIAGNOSIS — I480 Paroxysmal atrial fibrillation: Secondary | ICD-10-CM

## 2021-02-25 DIAGNOSIS — I2510 Atherosclerotic heart disease of native coronary artery without angina pectoris: Secondary | ICD-10-CM | POA: Diagnosis not present

## 2021-02-25 MED ORDER — APIXABAN 5 MG PO TABS: 5.0000 mg | ORAL_TABLET | Freq: Two times a day (BID) | ORAL | 0 refills | Status: DC

## 2021-02-25 MED ORDER — METOPROLOL SUCCINATE ER 50 MG PO TB24: ORAL_TABLET | ORAL | 3 refills | Status: DC

## 2021-02-25 NOTE — Patient Instructions (Signed)
Medication Instructions:    Changes-  Stop taking Metoprolol succinate 100 mg  Start taking  Metoprolol succinate 50 mg in the morning  and 25 mg ( 1/2 tablet)   in the evening    *If you need a refill on your cardiac medications before your next appointment, please call your pharmacy*   Lab Work:  Not needed   Testing/Procedures: Both will be schedule at [place] Your physician has requested that you have a carotid duplex. This test is an ultrasound of the carotid arteries in your neck. It looks at blood flow through these arteries that supply the brain with blood. Allow one hour for this exam. There are no restrictions or special instructions.  And Your physician has requested that you have a upper extremity arterial duplex. This test is an ultrasound of the arteries in thearms. It looks at arterial blood flow in the  arms. Allow one hour for L Upper Arterial scans. There are no restrictions or special instructions   Follow-Up: At [facility], you and your health needs are our priority.  As part of our continuing mission to provide you with exceptional heart care, we have created designated Provider Care Teams.  These Care Teams include your primary Cardiologist (physician) and Advanced Practice Providers (APPs -  Physician Assistants and Nurse Practitioners) who all work together to provide you with the care you need, when you need it.     Your next appointment:   6 month(s)  The format for your next appointment:   In Person  Provider:   Shelva Majestic, MD    Other Instructions    Appointment in Feb 20223 with Dr [name]

## 2021-02-25 NOTE — Progress Notes (Signed)
Patient ID: [name], female   DOB: 02-17-1946, 75 y.o.   MRN: [id number]    Primary M.D.: Dr. Ardeth Perfect  HPI: [name] is a 75 year-old female who presents for an 65 month follow-up cardiology and sleep evaluation.    [name] has a history of CAD, diabetes mellitus, peripheral neuropathy, OSA and hypertension.   She underwent CABG revascularization surgery x2 with a maze procedure for atrial fibrillation at that time by Dr. Roxy Manns.  She experienced 2 episodes of palpitations where she felt her heart was temporarily out of rhythm. She denied associated chest tightness. She does note some shortness of breath. . A 2-D echo Doppler study on 10/05/2012 showed normal systolic function with an ejection fraction of 55-60% with mild tissue Doppler abnormality. Her aortic valve was trileaflet and sclerotic with very mild stenosis with a mean gradient of 10 and a peak gradient of 20 mm respectively. There is trivial AR. She also had posterior calcified mitral annulus with mild MR.  A nuclear perfusion study in 2014 revealed mild breast attenuation but otherwise normal perfusion.   She underwent permanent pacemaker insertion by Dr. Crissie Sickles.  She also has had issues with kidney stones and underwent 2 urological procedures in June and July 2016 on her left kidney by Dr. Nicolette Bang.  She developed atrial fibrillation in March and again in June 2017 which occurred at night after drinking cold juice.  She does snore.  She was hospitalized overnight after she presented with AF with a rapid ventricular rate at 129.  She was started on IV diltiazem with ultimate improvement and eventually her oral dose of Cardizem was increased.  Troponins were negative.  An echo during that hospitalization showed an EF of 60-65%.  There was normal wall motion.  There was mild aortic valve stenosis and she had a mean gradient of 10 and peak gradient of 20 with a valve area of 1.35.  She was seen in follow-up by [name] and was remaining stable.  She saw Dr. [name] on 12/04/2015 and had normal pacemaker function and was maintaining normal sinus rhythm over 99% of the time on her current dose of amiodarone 200 mg.    With her cardiovascular comorbidities and recurrent AF  I recommended that she undergo a sleep study to evaluate for sleep apnea.  This was done on 01/22/2016 and revealed moderate sleep apnea overall within AHI 15.4.  However, sleep apnea was severe during rems sleep at 36 per hour.  Her oxygen nadir was 83%.  She was scheduled to undergo a CPAP titration, but she canceled this appointment in January due to some other conflict.  When I last saw her, she had not yet had her CPAP titration.  I strongly recommended that she have this done.  He Pap titration was done on 07/30/2016 and she was titrated up to 15 cm water pressure.  She had a significant PLMS index of 85.6.  She has been on gabapentin for neuropathy, which will also treat restless legs.  She underwent an echo Doppler study on 07/03/2016 which showed an EF of 60-65%.  She had grade 2 diastolic dysfunction.  She had an upper normal to mildly increased.  The ascending aorta and a thickened mitral valve suggesting moderate stenosis.  There was a mild gradient across her aortic valve.  She had developed mild edema on diltiazem, which improved once this was discontinued.  She denies chest pain.  She continues to take eliquis for anticoagulation  and continues to be on amiodarone 200 mg for her PAF.   She received a new CPAP ResMed AirSense 10 AutoSet unit; setup was 09/30/2016.  She has a ResMed airFit 20 medium size mask.  A download was obtained from August 26 through 12/08/2016.  She is meeting compliance standards and had 93%.  Days of usage and 87% of days with usage greater than 4 hours.  However, she was only averaging 5 hours of sleep per night.  She is set at a 15 cm water pressure.  Her AHI is excellent at 0.1.  Used her machine were recently  due to the fact that she has a broken tooth.  This had resulted in significant teeth pain during the night with her unit on going into this region.  She was recently started on antibiotics by a dentist and will need tooth extraction.  She is sleeping well with her CPAP.  She denies breakthrough snoring.  She denies daytime sleepiness.    She developed an episode of head numbness with lightheadedness and dizziness which resulted in presenting to the emergency room in January 2019.  However, after waiting 4 hours in the waiting room after her blood work was obtained, she ultimately left.  She was seen in the office in follow-up by Fabian Sharp, [place] on April 21, 2017.  At that time her pacemaker was interrogated and showed adequate battery life with normal pacemaker function.  She was atrially paced 99% of the time.  There were no episodes of atrial tachycardia or atrial fibrillation.  She saw Dr. Crissie Sickles for follow-up evaluation and remote pacemaker device check from July 13, 2017 was normal.  She has a history of mixed hyperlipidemia and has had  issues with statin intolerance.  Laboratory in February 2019 showed a total cholesterol to 441, HDL 50, LDL 140, triglycerides 255.  VLDL was 51.  She was seen by Raquel Salvatore Decent and she was given a trial of pravastatin 20 mg to take in addition to her fenofibrate and omega-3 3 fatty acid.    Since I saw her in May 2019, she has had some issues with bronchitis.  She states that she believes she may be at times retaining some fluid.  She has been using sodium and typically add salt to tomatoes as well as apples.  She also eats food with high sodium content.  She denies any chest pressure.  She is unaware of palpitations.  She had undergone repeat laboratory since the initiation of pravastatin which showed significant improvement in her LDL cholesterol from 140 to 95 but LDL was still increased.  Triglycerides remained elevated at 237 with the LDL at 47.     I saw her in September 2019.  bBood pressure was improved on furosemide 40 mg daily, losartan 50 mg twice a day and Toprol-XL 100 mg.  Pravastatin was further titrated to 40 mg and she continued to be on Zetia with an LDL of 95.  She was maintaining sinus rhythm on amiodarone on Monday and Saturday and was on Eliquis for anticoagulation.  She continued to use CPAP with 100% compliance.  Amiodarone was stopped due to abnormal pulmonary function tests in October 2019 suggesting severe restrictive physiology including possible pulmonary fibrosis.  She saw Almyra Deforest, [place] in follow-up in January 2020.  She underwent a telemedicine visit with Dr. [name] on June 09, 2018 and was maintaining sinus rhythm.  I saw her on September 15, 2018 at which time she felt well.  She underwent bilateral cataract surgery on June 8 and June 22.  She was evaluated by Dr. Valeta Harms regarding amiodarone toxicity and will be undergoing follow-up pulmonary function testing with subsequent office visit on September 23, 2018.  She has had issues with ankle edema that has been mild as well as peripheral neuropathy.  She  had lab work done by her primary physician Dr. Melina Copa.  She was not very compliant with CPAP although her AHI was excellent at 0.2/h at 15 cm water pressure.  Again stressed the importance of continued CPAP therapy particularly with her history of atrial fibrillation and cardiovascular comorbidities.  She was hospitalized in September 2020 with atrial fibrillation with RVR treated with diltiazem drip transition to oral diltiazem.  During that hospitalization diltiazem dose was increased to 480 mg daily.  I saw her on March 02, 2019.  She was seen by pulmonary on March 01, 2019 and was felt to be in atrial fibrillation with heart rate at 130.  She was added onto my schedule  She was unaware of any atrial fibrillation today and her heart rate is improved.  She denied chest tightness or pressure.  She denied wheezing.  During that  evaluation, her initial ECG revealed atrial fibrillation with a ventricular rate in the 80s but a subsequent ECG suggested possible junctional rhythm with a heart rate in the 60s.  Subsequently, she was evaluated by Dr. [name] and underwent AV node ablation in January 2021 for uncontrolled atrial fibrillation.  She had successful weight loss.  She was seen by him in follow-up in February 2021 and her atrial fibrillation rate was well controlled status post AV node ablation.  Her Medtronic DDD permanent pacemaker was working normally.  Her blood pressure was mildly elevated and she was encouraged to reduce her sodium intake.  She was seen by Almyra Deforest, PA in a telemedicine visit in April 2021.  She denied any chest pain.  Her blood pressure was stable.  I last saw her on February 13, 2020 at which time she denied any chest pain or palpitations.  She is to undergo laboratory at [place] later this week and she sees Dr. Nadara Mustard to as her primary physician.  She continues to undergo remote pacemaker checks at 75-monthintervals.  She has continued to be on losartan 50 mg, furosemide 40 mg twice a day and metoprolol succinate 100 mg daily with rate control of her AF.  She is on Eliquis 5 mg twice a day for anticoagulation.  She is on fenofibrate 160 mg for her mixed hyperlipidemia and is on metformin 1000 mg twice a day for her diabetes mellitus.  She no longer uses CPAP therapy.  During that evaluation, her blood pressure was stable on therapy and she continued to be on Eliquis without bleeding.  Since I last saw her, over the past year she admits to some increased fatigue.  She also notes occasional leg swelling.  She continues to undergo remote pacemaker checks at 363-monthntervals and is followed by Dr. TaLovena Le She had seen Dr. ChRadene Journeyn March 2022 for bilateral impacted cerumen and sensorineural hearing loss.  She continues to be on metoprolol succinate1 00 mg, losartan 50 mg, furosemide 40 mg  twice a day and has a prescription for metolazone 2.5 mg to take as needed for her edema.  She is anticoagulated on Eliquis.  She is diabetic on Humulin insulin and metformin.  She presents for evaluation.   Past Medical History:  Diagnosis  Date   Asthmatic bronchitis    CHF (congestive heart failure) ([place]) dx'd 2016   Coronary atherosclerosis of native coronary artery    a. s/p CABG 2005 Dr Roxy Manns     Hyperlipidemia    Hypertension    Hypertensive heart disease    Kidney stones    Mixed hyperlipidemia    Nephrolithiasis 06/07/2015   Paroxysmal atrial fibrillation ([place])    a. s/p Maze in 2005 b. recurrence in March 2016 with >8sec posttermination pauses s/p PPM implant     Pneumonia 1950   "double"   Presence of permanent cardiac pacemaker    Restrictive lung disease 03/03/2018   Sleep apnea    "tested; mask ordered; couldn't afford it; will get it now" (09/04/2015)   TIA (transient ischemic attack)    Type II diabetes mellitus ([place])    Uterine cancer [facility])    age 73 with partial hysterectomy    Past Surgical History:  Procedure Laterality Date   AV NODE ABLATION N/A 03/28/2019   Procedure: AV NODE ABLATION;  Surgeon: Evans Lance, MD;  Location: [place] [facility];  Service: Cardiovascular;  Laterality: N/A;   CARDIAC CATHETERIZATION  2005   CORONARY ARTERY BYPASS GRAFT  2005   Dr Roxy Manns with MAZE   CYSTOSCOPY WITH URETEROSCOPY, STONE BASKETRY AND STENT PLACEMENT Left 09/08/2014   Procedure: CYSTOSCOPY WITH URETEROSCOPY, STONE BASKETRY AND STENT PLACEMENT;  Surgeon: Cleon Gustin, MD;  Location: [facility];  Service: Urology;  Laterality: Left;   CYSTOSCOPY/URETEROSCOPY/HOLMIUM LASER/STENT PLACEMENT Left 09/15/2014   Procedure: CYSTOSCOPY/RETROGRADE/URETEROSCOPY/HOLMIUM LASER/ STONE EXTRACTION /STENT EXCHANGE;  Surgeon: Cleon Gustin, MD;  Location: [facility];  Service: Urology;  Laterality: Left;   HOLMIUM LASER APPLICATION Left 6/50/3546   Procedure: HOLMIUM LASER APPLICATION;   Surgeon: Cleon Gustin, MD;  Location: [facility];  Service: Urology;  Laterality: Left;   HOLMIUM LASER APPLICATION Left 07/20/8125   Procedure: HOLMIUM LASER APPLICATION;  Surgeon: Cleon Gustin, MD;  Location: [facility];  Service: Urology;  Laterality: Left;   INSERT / REPLACE / REMOVE PACEMAKER     LAPAROSCOPIC CHOLECYSTECTOMY     PERMANENT PACEMAKER INSERTION N/A 06/05/2014   MDT 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     amitriptyline (ELAVIL) 10 MG tablet Take 1 tablet by mouth at bedtime.     apixaban (ELIQUIS) 5 MG TABS tablet TAKE ONE TABLET TWICE DAILY 60 tablet 0   B Complex-Biotin-FA (B-COMPLEX PO) Take 1 capsule by mouth every morning.      BIOTIN 5000 PO Take 1 tablet by mouth daily.     Cholecalciferol (VITAMIN D3) 1000 UNITS CAPS Take 1,000 Units by mouth daily.     Coenzyme Q10 (CO Q-10) 200 MG CAPS Take 200 mg by mouth daily.     diphenhydramine-acetaminophen (TYLENOL PM) 25-500 MG TABS tablet Take 1 tablet by mouth at bedtime as needed (pain/sleep).      fenofibrate 160 MG tablet TAKE 1 TABLET DAILY 90 tablet 0   furosemide (LASIX) 40 MG tablet TAKE  (1)  TABLET TWICE A DAY. 180 tablet 1   HUMALOG MIX 75/25 KWIKPEN (75-25) 100 UNIT/ML Kwikpen Inject 34-38 Units into the skin in the morning, at noon, and at bedtime.  losartan (COZAAR) 50 MG tablet TAKE 1 TABLET UPTO TWICE A DAY AS DIRECTED 180 tablet 1   metFORMIN (GLUCOPHAGE) 1000 MG tablet Take 1,000 mg by mouth 2 (two) times daily with a meal.      metolazone (ZAROXOLYN) 2.5 MG tablet Take 2.5 mg by mouth daily as needed.     metoprolol succinate (TOPROL-XL) 100 MG 24 hr tablet TAKE 1 TABLET ONCE A DAY WITH A MEAL 90 tablet 3   potassium chloride  (KLOR-CON) 10 MEQ tablet Take 20 mEq by mouth daily.     No current facility-administered medications for this visit.    Socially she is married to [employer]; she has 2 children one grandchild. She is retired. She completed ninth grade education. Has no history of tobacco use. She is not drink alcohol. She does walk approximately 2-3 times per week.  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 Valeta Harms Cardiovascular: He history of present illness GI: Negative; No nausea, vomiting, diarrhea, or abdominal pain GU: Positive for  left uteroscopic stone manipulation with laser lithotripsy for left kidney stone Musculoskeletal: Negative; no myalgias, joint pain, or weakness Hematologic/Oncology: Negative; no easy bruising, bleeding Endocrine: Positive for diabetes mellitus Neuro: Positive for peripheral neuropathy Skin: Negative; No rashes or skin lesions Psychiatric: Negative; No behavioral problems, depression Sleep: Positive for OSA, no longer using CPAP therapy.  No daytime sleepiness and Epworth Sleepiness Scale score was calculated in the office today and this endorsed at 3 arguing against residual daytime sleepiness.   Other comprehensive 14 point system review is negative.   PE BP 120/70 (BP Location: Right Arm)   Pulse 73   Ht _0  (1.626 m)   Wt 230 lb (104.3 kg)   SpO2 91%   BMI 39.48 kg/m    Repeat blood pressure by me in the  left arm was 116/70 and on her right arm was 86/60.  Wt Readings from Last 3 Encounters:  02/25/21 230 lb (104.3 kg)  05/03/20 227 lb 9.6 oz (103.2 kg)  02/13/20 221 lb 6.4 oz (100.4 kg)   General: Alert, oriented, no distress.  Skin: normal turgor, no rashes, warm and dry HEENT: Normocephalic, atraumatic. Pupils equal round and reactive to light; sclera anicteric; extraocular muscles intact;  Nose without nasal septal hypertrophy Mouth/Parynx benign; Mallinpatti scale 3 Neck: No JVD, no carotid bruits; normal carotid upstroke Lungs: clear to ausculatation and percussion; no wheezing or rales Chest wall: without tenderness to palpitation Heart: PMI not displaced, RRR, s1 s2 normal, 1/6 systolic murmur, no diastolic murmur, no rubs, gallops, thrills, or heaves Abdomen: soft, nontender; no hepatosplenomehaly, BS+; abdominal aorta nontender and not dilated by palpation. Back: no CVA tenderness Pulses 2+ Musculoskeletal: full range of motion, normal strength, no joint deformities Extremities: Trace bilateral leg swelling;  no clubbing cyanosis Homan's sign negative  Neurologic: grossly nonfocal; Cranial nerves grossly wnl Psychologic: Normal  mood and affect   February 25, 2021 ECG (independently read by me):  Ventricular paced at 73  February 13, 2020 ECG (independently read by me): V paced at 72.  03/02/2019 repeat ECG after prolonged auscultation suggested a regular rhythm: Possible accelerated junctional rhythm at 69 bpm.  Rhythm is regular.  No clear discernible P waves   Initial 03/02/2019 ECG (independently read by me): Atrial fibrillation at 85 bpm.  Rightward axis.  Nonspecific ST changes.  July 2020 ECG (independently read by me): Atrially paced rhythm at 85 bpm; prolonged AV conduction with PR interval 246 ms.  Nonspecific ST changes seconds  September 2018 ECG (independently read by me): Atrially paced at 84 bpm.  Incomplete right bundle branch block.  Anterolateral ST  abnormality.  May 2019 ECG (independently read by me): Atrially paced rhythm with ventricular rate at 67 bpm.  Prolonged AV conduction with PR interval 272 ms.  QTC prolongation _0 ms.  Nonspecific ST-T changes.  September 2018 ECG (independently read by me): Atrially paced rhythm at 83 bpm with prolonged AV conduction with a PR interval 284 ms.  QTc interval 484 ms.  Nonspecific ST changes.  March 2018 ECG (independently read by me): Atrially paced rhythm at 81 bpm.  Prolonged AV conduction with a PR interval at 258 ms.  QTc interval 473 ms.  Nonspecific ST-T changes.  September 2017 ECG (independently read by me): Atrially paced rhythm with a PR interval at 248.  Complete right bundle branch block.  QTc interval 458 ms.  March 2017 ECG (independently read by me):  Atrially paced rhythm with prolonged AV conduction with PR interval 232 ms.  Nonspecific ST-T changes.  September 2016 ECG (independently read by me): A paced rhythm at 63 with prolonged AV conduction with a PR 214.  QTC normal at 446 ms.  July 2015 ECG (independently read by me): Sinus rhythm with short PR 88 ms.  Nonspecific T changes.  Incomplete right bundle branch block  10/2013 ECG: Sinus rhythm at 69 beats per minute. QTc interval 445 ms.  LABS: BMP Latest Ref Rng & Units 03/24/2019 12/13/2018 12/12/2018  Glucose 65 - 99 mg/dL 238(H) 197(H) 177(H)  BUN 8 - 27 mg/dL _1 Creatinine 0.57 - 1.00 mg/dL 0.88 0.92 0.94  BUN/Creat Ratio 12 - 28 19 - -  Sodium 134 - 144 mmol/L 137 136 138  Potassium 3.5 - 5.2 mmol/L 4.0 3.7 3.5  Chloride 96 - 106 mmol/L 96 99 98  CO2 20 - 29 mmol/L _2 Calcium 8.7 - 10.3 mg/dL 9.8 9.5 9.3   Hepatic Function Latest Ref Rng & Units 12/11/2018 03/19/2018 10/20/2017  Total Protein 6.5 - 8.1 g/dL 7.1 7.0 7.0  Albumin 3.5 - 5.0 g/dL 3.7 4.5 4.3  AST 15 - 41 U/L 50(H) 40 53(H)  ALT 0 - 44 U/L _3 Alk Phosphatase 38 - 126 U/L 55 50 46  Total Bilirubin 0.3 - 1.2 mg/dL 1.0 0.3 0.4   Bilirubin, Direct <=0.2 mg/dL - - -   CBC Latest Ref Rng & Units 03/24/2019 12/12/2018 12/11/2018  WBC 3.4 - 10.8 x10E3/uL 10.9(H) 9.5 9.0  Hemoglobin 11.1 - 15.9 g/dL 12.2 11.4(L) 12.4  Hematocrit 34.0 - 46.6 % 36.5 36.0 37.7  Platelets 150 - 450 x10E3/uL 381 285 282   Lab Results  Component Value Date   MCV 88 03/24/2019   MCV 94.0 12/12/2018   MCV 93.1 12/11/2018   Lab Results  Component  Value Date   TSH 2.088 09/05/2015   Lab Results  Component Value Date   HGBA1C 10.9 (H) 12/09/2018   Lipid Panel     Component Value Date/Time   CHOL 188 03/19/2018 0931   TRIG 228 (H) 03/19/2018 0931   HDL 48 03/19/2018 0931   CHOLHDL 3.9 03/19/2018 0931   CHOLHDL 4.7 09/08/2016 0910   VLDL 42 (H) 09/08/2016 0910   LDLCALC 94 03/19/2018 0931   Laboratory from Dr. Hughie Closs office from   February 22,017. Lipid studies revealed a total cholesterol 186, LDL cholesterol 80, triglycerides 333 and VLDL cholesterol 67. Her glucose was 283.   Laboratory from Dr. Hughie Closs office from 11/27/2014: Fasting glucose 220, BUN 16, creatinine 0.66.  CO2 30.5.  Hemoglobin 12.1, hematocrit 37.3.  Hemoglobin A1c 9.4.  Total cholesterol 204, triglycerides 261, HDL 44, LDL 108, VLDL 52.   RADIOLOGY: No results found.  IMPRESSION:  No diagnosis found.   ASSESSMENT AND PLAN:   Ms. Garrow is a 75 year-old female who has a history of type 2 diabetes mellitus and is status post CABG surgery x2 by Dr. Roxy Manns in 2005. She has a history of atrial fibrillation and underwent a Maze procedure at the time of her surgery.  She is status post permanent pacemaker insertion by Dr. [name].  She had been on amiodarone which ultimately was weaned and DC'd due to concerns for amiodarone toxicity and interstitial lung disease.  She developed recurrent episodes of atrial fibrillation in all underwent AV node ablation by Dr. [name] in January 2021.  Her ventricular rate has been controlled.  On ECG today she is  ventricular placed at 73 bpm.  She has been on metoprolol succinate 100 mg daily in addition to losartan 50 mg, (but apparently stated up to twice a day, furosemide 40 mg, and she has a prescription for metolazone which he takes 2.5 mg as needed with supplemental potassium.  There is mild leg swelling today.  Her blood pressure today does show differential between her left and right arm.  I am recommending she undergo carotid and upper extremity arterial Doppler evaluation for further evaluation of this blood pressure discrepancy to make certain there is no subclavian or other stenoses.  She admits to increasing fatigability.  I am decreasing her metoprolol from 100 mg daily and will change this to 50 mg in the morning and 25 mg at night and will give her a 50 mg prescription.  She continues to be ventricular paced.  She is followed by Dr. Ardeth Perfect and recent hemoglobin A1c was 7.0.  She continues to be on Eliquis for anticoagulation.  She is on fenofibrate for mixed hyperlipidemia.  I will see her in 6 months for reevaluation.   Troy Sine, MD, [facility] 02/25/2021 12:20 PM

## 2021-03-04 ENCOUNTER — Encounter: Payer: Self-pay | Admitting: Cardiovascular Disease

## 2021-03-12 ENCOUNTER — Other Ambulatory Visit (HOSPITAL_COMMUNITY): Payer: Medicare Other

## 2021-03-12 ENCOUNTER — Ambulatory Visit (HOSPITAL_COMMUNITY)
Admission: RE | Admit: 2021-03-12 | Payer: Medicare Other | Source: Ambulatory Visit | Attending: Cardiovascular Disease | Admitting: Cardiovascular Disease

## 2021-03-12 ENCOUNTER — Ambulatory Visit (HOSPITAL_COMMUNITY): Payer: Medicare Other

## 2021-03-12 ENCOUNTER — Encounter (HOSPITAL_COMMUNITY): Payer: Medicare Other

## 2021-03-19 ENCOUNTER — Ambulatory Visit (INDEPENDENT_AMBULATORY_CARE_PROVIDER_SITE_OTHER): Payer: PPO

## 2021-03-19 ENCOUNTER — Other Ambulatory Visit: Payer: Self-pay | Admitting: Cardiovascular Disease

## 2021-03-19 DIAGNOSIS — I442 Atrioventricular block, complete: Secondary | ICD-10-CM | POA: Diagnosis not present

## 2021-03-19 DIAGNOSIS — Z950 Presence of cardiac pacemaker: Secondary | ICD-10-CM

## 2021-03-19 DIAGNOSIS — I480 Paroxysmal atrial fibrillation: Secondary | ICD-10-CM

## 2021-03-19 DIAGNOSIS — Z951 Presence of aortocoronary bypass graft: Secondary | ICD-10-CM

## 2021-03-19 DIAGNOSIS — I2510 Atherosclerotic heart disease of native coronary artery without angina pectoris: Secondary | ICD-10-CM

## 2021-03-19 DIAGNOSIS — G4733 Obstructive sleep apnea (adult) (pediatric): Secondary | ICD-10-CM

## 2021-03-19 DIAGNOSIS — R031 Nonspecific low blood-pressure reading: Secondary | ICD-10-CM

## 2021-03-19 DIAGNOSIS — I5032 Chronic diastolic (congestive) heart failure: Secondary | ICD-10-CM

## 2021-03-19 DIAGNOSIS — E782 Mixed hyperlipidemia: Secondary | ICD-10-CM

## 2021-03-20 LAB — CUP PACEART REMOTE DEVICE CHECK
Battery Remaining Longevity: 22 mo
Battery Voltage: 2.93 V
Brady Statistic RA Percent Paced: 0.5 %
Brady Statistic RV Percent Paced: 98.09 %
Date Time Interrogation Session: 20230103152411
Implantable Lead Implant Date: 20160321
Implantable Lead Implant Date: 20160321
Implantable Lead Location: 753859
Implantable Lead Location: 753860
Implantable Lead Model: 5076
Implantable Lead Model: 5076
Implantable Pulse Generator Implant Date: 20160321
Lead Channel Impedance Value: 361 Ohm
Lead Channel Impedance Value: 437 Ohm
Lead Channel Impedance Value: 475 Ohm
Lead Channel Impedance Value: 475 Ohm
Lead Channel Pacing Threshold Amplitude: 0.5 V
Lead Channel Pacing Threshold Amplitude: 0.875 V
Lead Channel Pacing Threshold Pulse Width: 0.4 ms
Lead Channel Pacing Threshold Pulse Width: 0.4 ms
Lead Channel Sensing Intrinsic Amplitude: 0.75 mV
Lead Channel Sensing Intrinsic Amplitude: 0.75 mV
Lead Channel Sensing Intrinsic Amplitude: 6.625 mV
Lead Channel Sensing Intrinsic Amplitude: 6.625 mV
Lead Channel Setting Pacing Amplitude: 2 V
Lead Channel Setting Pacing Amplitude: 2.5 V
Lead Channel Setting Pacing Pulse Width: 0.4 ms
Lead Channel Setting Sensing Sensitivity: 4 mV

## 2021-03-22 ENCOUNTER — Ambulatory Visit (HOSPITAL_COMMUNITY)
Admission: RE | Admit: 2021-03-22 | Payer: PPO | Source: Ambulatory Visit | Attending: Cardiovascular Disease | Admitting: Cardiovascular Disease

## 2021-03-22 ENCOUNTER — Ambulatory Visit (HOSPITAL_COMMUNITY): Payer: PPO

## 2021-03-27 DIAGNOSIS — J45909 Unspecified asthma, uncomplicated: Secondary | ICD-10-CM | POA: Diagnosis not present

## 2021-03-27 DIAGNOSIS — E1169 Type 2 diabetes mellitus with other specified complication: Secondary | ICD-10-CM | POA: Diagnosis not present

## 2021-03-27 DIAGNOSIS — I5020 Unspecified systolic (congestive) heart failure: Secondary | ICD-10-CM | POA: Diagnosis not present

## 2021-03-27 DIAGNOSIS — E785 Hyperlipidemia, unspecified: Secondary | ICD-10-CM | POA: Diagnosis not present

## 2021-03-27 DIAGNOSIS — Z794 Long term (current) use of insulin: Secondary | ICD-10-CM | POA: Diagnosis not present

## 2021-03-27 DIAGNOSIS — R0982 Postnasal drip: Secondary | ICD-10-CM | POA: Diagnosis not present

## 2021-03-27 DIAGNOSIS — I10 Essential (primary) hypertension: Secondary | ICD-10-CM | POA: Diagnosis not present

## 2021-03-27 DIAGNOSIS — G720 Drug-induced myopathy: Secondary | ICD-10-CM | POA: Diagnosis not present

## 2021-03-28 NOTE — Progress Notes (Signed)
Remote pacemaker transmission.

## 2021-04-01 ENCOUNTER — Other Ambulatory Visit: Payer: Self-pay | Admitting: Cardiovascular Disease

## 2021-04-01 NOTE — Telephone Encounter (Signed)
Prescription refill request for Eliquis received. Indication:Afib Last office visit:12/22 LRJ:[id number] labs Age: 76 Weight:104.3 kg  Prescription refilled

## 2021-04-30 ENCOUNTER — Ambulatory Visit (HOSPITAL_COMMUNITY)
Admission: RE | Admit: 2021-04-30 | Discharge: 2021-04-30 | Disposition: A | Discharge status: Home / Self Care | Payer: PPO | Source: Ambulatory Visit | Attending: Cardiology | Admitting: Cardiology

## 2021-04-30 ENCOUNTER — Other Ambulatory Visit: Payer: Self-pay | Admitting: Cardiovascular Disease

## 2021-04-30 ENCOUNTER — Other Ambulatory Visit: Payer: Self-pay

## 2021-04-30 ENCOUNTER — Ambulatory Visit (HOSPITAL_BASED_OUTPATIENT_CLINIC_OR_DEPARTMENT_OTHER)
Admission: RE | Admit: 2021-04-30 | Discharge: 2021-04-30 | Disposition: A | Discharge status: Home / Self Care | Payer: PPO | Source: Ambulatory Visit | Attending: Cardiovascular Disease | Admitting: Cardiovascular Disease

## 2021-04-30 DIAGNOSIS — I5032 Chronic diastolic (congestive) heart failure: Secondary | ICD-10-CM | POA: Insufficient documentation

## 2021-04-30 DIAGNOSIS — I480 Paroxysmal atrial fibrillation: Secondary | ICD-10-CM

## 2021-04-30 DIAGNOSIS — I2510 Atherosclerotic heart disease of native coronary artery without angina pectoris: Secondary | ICD-10-CM

## 2021-04-30 DIAGNOSIS — G4733 Obstructive sleep apnea (adult) (pediatric): Secondary | ICD-10-CM

## 2021-04-30 DIAGNOSIS — Z951 Presence of aortocoronary bypass graft: Secondary | ICD-10-CM | POA: Insufficient documentation

## 2021-04-30 DIAGNOSIS — Z950 Presence of cardiac pacemaker: Secondary | ICD-10-CM

## 2021-04-30 DIAGNOSIS — R031 Nonspecific low blood-pressure reading: Secondary | ICD-10-CM

## 2021-04-30 DIAGNOSIS — M7989 Other specified soft tissue disorders: Secondary | ICD-10-CM | POA: Diagnosis not present

## 2021-04-30 DIAGNOSIS — I6523 Occlusion and stenosis of bilateral carotid arteries: Secondary | ICD-10-CM | POA: Diagnosis not present

## 2021-04-30 DIAGNOSIS — E782 Mixed hyperlipidemia: Secondary | ICD-10-CM | POA: Insufficient documentation

## 2021-04-30 NOTE — Telephone Encounter (Signed)
Prescription refill request for Eliquis received. Indication: Afib  Last office visit:02/25/21 Claiborne Billings)  Scr:1.2 (02/13/21)  Age: 76 Weight: 104.3kg  Appropriate dose and refill sent to requested pharmacy.

## 2021-05-03 ENCOUNTER — Telehealth: Payer: Self-pay | Admitting: Internal Medicine

## 2021-05-03 NOTE — Telephone Encounter (Signed)
My ILD patien not seen in 2 years  Plan  - spiro/dlco - HRCT supine and prone, inspriatory and  expiratory volume - ROV first avail 30 min    SIGNATURE    Dr. Brand Males, M.D., F.C.C.P,  Pulmonary and Critical Care Medicine Staff Physician, [place] Director - Interstitial Lung Disease  Program  P[facility] [place], [place], [address]  NPI Number:  NPI #[id number] DEA Number: [id number]  Pager: [phone], If no answer  -> Check AMION or Try [place] Telephone (clinical office): [phone] Telephone (research): [phone]  8:45 PM 05/03/2021

## 2021-05-04 NOTE — Progress Notes (Unsigned)
Cardiology Office Note Date:  05/04/2021  Patient ID:  [name], [name] 1945/05/05, MRN [id number] PCP:  Velna Hatchet, MD  Cardiologist:  Dr. Claiborne Billings Electrophysiologist: Dr. [name]    Chief Complaint:  *** annual visit  History of Present Illness: [name] is a 76 y.o. female with history of HTN, HLD, TIA, DM, Afib, CAD (CABG and MAZE 2005), post termination pauses >> PPM, ILD, OSA w/CPAP.  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  The pt apparently with h/o being bird Conservation officer, historic buildings, Sales executive breeder, Firefighter, Engineer, manufacturing systems, Museum/gallery exhibitions officer, amiodarone  She was having difficulty accepting her lung disease likely 2/2 bird exposures. She had stable DOE (hills, stairs, or mod exertion), was not interested in pursuing broncoscopy, biopsy, of genomic evaluation, wanted to avoid steroid therapy. PFTs stable.  Her HR was noted elevated (130's) and recommended to f/u with cardiology  She comes today to be seen for Dr. [name], last seen by him Feb 2022, she was doing well, had lost quite a bit of weight, no changes were made.  More recently saw Dr. Claiborne Billings, Dec 2022, c/o fatigue and some LE swelling.  Exam not ed unequal BPs with plans for UE vascular US.  Her metoprolol reduced to try and improve her fatigue. (left arm was 116/70 and on her right arm was 86/60)  *** vascular referal, Dr. Claiborne Billings, innomiat stenosis? *** eliquis, bleeding, labs, dose *** BPs b/l arms *** symptoms?   Device information MDT dual chamber PPM, implanted 06/05/2014  AFib hx MAZE 2005 AV node ablation Jan 2021 AAD  Amiodarone stopped Oct 2019 2/2 concerns of pulmonary tox   Past Medical History:  Diagnosis Date   Asthmatic bronchitis    CHF (congestive heart failure) ([place]) dx'd 2016    Coronary atherosclerosis of native coronary artery    a. s/p CABG 2005 Dr Roxy Manns     Hyperlipidemia    Hypertension    Hypertensive heart disease    Kidney stones    Mixed hyperlipidemia    Nephrolithiasis 06/07/2015   Paroxysmal atrial fibrillation ([place])    a. s/p Maze in 2005 b. recurrence in March 2016 with >8sec posttermination pauses s/p PPM implant     Pneumonia 1950   "double"   Presence of permanent cardiac pacemaker    Restrictive lung disease 03/03/2018   Sleep apnea    "tested; mask ordered; couldn't afford it; will get it now" (09/04/2015)   TIA (transient ischemic attack)    Type II diabetes mellitus ([place])    Uterine cancer [facility])    age 38 with partial hysterectomy    Past Surgical History:  Procedure Laterality Date   AV NODE ABLATION N/A 03/28/2019   Procedure: AV NODE ABLATION;  Surgeon: Evans Lance, MD;  Location: [facility];  Service: Cardiovascular;  Laterality: N/A;   CARDIAC CATHETERIZATION  2005   CORONARY ARTERY BYPASS GRAFT  2005   Dr Roxy Manns with MAZE   CYSTOSCOPY WITH URETEROSCOPY, STONE BASKETRY AND STENT PLACEMENT Left 09/08/2014   Procedure: CYSTOSCOPY WITH URETEROSCOPY, STONE BASKETRY AND STENT PLACEMENT;  Surgeon: Cleon Gustin, MD;  Location: [facility];  Service: Urology;  Laterality: Left;   CYSTOSCOPY/URETEROSCOPY/HOLMIUM LASER/STENT PLACEMENT Left 09/15/2014   Procedure: CYSTOSCOPY/RETROGRADE/URETEROSCOPY/HOLMIUM LASER/ STONE EXTRACTION /STENT EXCHANGE;  Surgeon: Cleon Gustin, MD;  Location: [facility];  Service: Urology;  Laterality: Left;   HOLMIUM LASER APPLICATION Left 08/13/509   Procedure: HOLMIUM LASER APPLICATION;  Surgeon: Cleon Gustin, MD;  Location: [facility];  Service: Urology;  Laterality: Left;   HOLMIUM LASER APPLICATION Left 0/04/1115   Procedure: HOLMIUM LASER APPLICATION;  Surgeon: Cleon Gustin, MD;  Location: [facility];  Service: Urology;  Laterality: Left;   INSERT / REPLACE / REMOVE PACEMAKER     LAPAROSCOPIC  CHOLECYSTECTOMY     PERMANENT PACEMAKER INSERTION N/A 06/05/2014   MDT Advisa 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     amitriptyline (ELAVIL) 10 MG tablet Take 1 tablet by mouth at bedtime.     apixaban (ELIQUIS) 5 MG TABS tablet TAKE ONE TABLET TWICE DAILY 60 tablet 3   B Complex-Biotin-FA (B-COMPLEX PO) Take 1 capsule by mouth every morning.      BIOTIN 5000 PO Take 1 tablet by mouth daily.     Cholecalciferol (VITAMIN D3) 1000 UNITS CAPS Take 1,000 Units by mouth daily.     Coenzyme Q10 (CO Q-10) 200 MG CAPS Take 200 mg by mouth daily.     diphenhydramine-acetaminophen (TYLENOL PM) 25-500 MG TABS tablet Take 1 tablet by mouth at bedtime as needed (pain/sleep).      fenofibrate 160 MG tablet TAKE 1 TABLET DAILY 90 tablet 0   furosemide (LASIX) 40 MG tablet TAKE  (1)  TABLET TWICE A DAY. 180 tablet 1   HUMALOG MIX 75/25 KWIKPEN (75-25) 100 UNIT/ML Kwikpen Inject 34-38 Units into the skin in the morning, at noon, and at bedtime.     losartan (COZAAR) 50 MG tablet TAKE 1 TABLET UPTO TWICE A DAY AS DIRECTED 180 tablet 1   metFORMIN (GLUCOPHAGE) 1000 MG tablet Take 1,000 mg by mouth 2 (two) times daily with a meal.      metolazone (ZAROXOLYN) 2.5 MG tablet Take 2.5 mg by mouth daily as needed.     metoprolol succinate (TOPROL-XL) 50 MG 24 hr tablet Take 50 mg ( 1 tablet) in the morning  and 25 mg ( 1/2 tablet ) in the evening 135 tablet 3   potassium chloride (KLOR-CON) 10 MEQ tablet Take 20 mEq by mouth daily.     No current facility-administered medications for this visit.    Allergies:   Patient has no known allergies.   Social History:  The patient  reports that she has never smoked. She has never used smokeless tobacco. She reports that she does  not drink alcohol and does not use drugs.   Family History:  The patient's family history includes Arthritis in her brother; Diabetes in her father; Heart attack in her maternal grandfather; Heart disease in her father and mother; Hyperlipidemia in her mother; Hypertension in her daughter, mother, and sister; Stroke in her father and paternal grandfather.  ROS:  Please see the history of present illness.  All other systems are reviewed and otherwise negative.   PHYSICAL EXAM:  VS:  There were no vitals taken for this visit. BMI: There is no height or weight on file to calculate BMI. Well nourished, well developed, in no acute distress  HEENT: normocephalic, atraumatic  Neck: no JVD, carotid bruits or masses Cardiac:  *** RRR,(paced); no significant murmurs, no rubs, or gallops Lungs:  ***  CTA b/l, no wheezing, rhonchi or rales  Abd: soft, nontender, obese MS: no deformity or atrophy Ext: *** trace edema only, r groin is soft, nontender, healed well Skin: warm and dry, no rash Neuro:  No gross deficits appreciated Psych: euthymic mood, full affect  *** PPM site is stable, no tethering or discomfort   EKG:  Not done today  PPM interrogation done today and reviewed by myself:  ***  04/30/21: UE arterial doppler Summary:     Right: No significant arterial obstruction detected in the right         upper extremity.  Left: No significant arterial obstruction detected in the left upper        extremity.  *See table(s) above for measurements and observations   04/30/21: Carotid US Summary:  Right Carotid: Velocities in the right ICA are consistent with a 1-39%  stenosis.                 The ECA appears >50% stenosed.   Left Carotid: Velocities in the left ICA are consistent with a 1-39%  stenosis.                Non-hemodynamically significant plaque <50% noted in the  CCA.                The ECA appears >50% stenosed.   Vertebrals:  Left vertebral artery demonstrates  antegrade flow. The right               vertebral artery demonstrates atypical flow with early  systolic               deceleration.  Subclavians: Normal flow hemodynamics were seen in bilateral subclavian               arteries. Elevated velocities are seen in the innominate  artery               consistent with possible high-grade stenosis in this vessel.    12/09/2018: TTE IMPRESSIONS 1. Left ventricular ejection fraction, by visual estimation, is 55%. The left ventricle has normal function. Normal left ventricular size. There is mildly increased left ventricular hypertrophy.  2. Definity contrast agent was given IV to delineate the left ventricular endocardial borders.  3. Left ventricular diastolic Doppler parameters are indeterminate pattern of LV diastolic filling.  4. The RV was poorly visualized. Global right ventricle has mildly reduced systolic function.The right ventricular size is mildly enlarged. No increase in right ventricular wall thickness. D-shaped interventricular septum suggestive of RV  pressure/volume overload.  5. Moderate mitral annular calcification.  6. The mitral valve is degenerative. No evidence of mitral valve regurgitation. No evidence of mitral stenosis.  7. The tricuspid valve is normal in structure. Tricuspid valve regurgitation is trivial.  8. The aortic valve is tricuspid Aortic valve regurgitation is trivial by color flow Doppler. Mild to moderate aortic valve sclerosis/calcification without any evidence of aortic stenosis.  9. There is mild dilatation of the ascending aorta measuring 37 mm. 10. A pacer wire is visualized. 11. Left atrial size was mildly dilated. 12. The inferior vena cava is dilated in size with <50% respiratory variability, suggesting right atrial pressure of 15 mmHg. 13. The tricuspid regurgitant velocity is 2.73 m/s, and with an assumed right atrial pressure of 15 mmHg, the estimated right ventricular systolic pressure is moderately  elevated at 44.8 mmHg. 14. Right atrial size was not well visualized.    10/07/2012: stress myoview Impression Exercise Capacity:  Gilson  exercise capacity. BP Response:  Normal blood pressure response. Clinical Symptoms:  No significant symptoms noted. ECG Impression:  No significant ST segment change suggestive of ischemia. Comparison with Prior Nuclear Study: No images to compare   Overall Impression:  Low risk stress nuclear study Breast attenuation artifact.   LV Wall Motion:  NL LV Function; NL Wall Motion    Recent Labs: No results found for requested labs within last 8760 hours.  No results found for requested labs within last 8760 hours.   CrCl cannot be calculated (Patient's most recent lab result is older than the maximum 21 days allowed.).   Wt Readings from Last 3 Encounters:  02/25/21 230 lb (104.3 kg)  05/03/20 227 lb 9.6 oz (103.2 kg)  02/13/20 221 lb 6.4 oz (100.4 kg)     Other studies reviewed: Additional studies/records reviewed today include: summarized above  ASSESSMENT AND PLAN:   1. PPM     ***  2. Longstanding persistent Afib     CHA2DS2Vasc is 7, on eliquis, *** appropriately dosed     *** s/p AV node ablation   3. CAD     *** No anginal symptoms     *** Continue with Dr. Claiborne Billings  4. HTN     ***   5. Volume OL     Not grossly volume OL     ***  6. 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  The patient did not have any concerns regarding medicines.  Venetia Night, PA-C 05/04/2021 7:55 AM     [facility] [place] [address] [phone] (office)  [phone] (fax)

## 2021-05-06 ENCOUNTER — Encounter: Payer: Medicare Other | Admitting: Physician Assistant

## 2021-05-06 NOTE — Telephone Encounter (Signed)
Spoke with patient's daughter, Arrie Aran, patient wants to schedule an appointment with Dr. Valeta Harms. Informed daughter that Dr. Chase Caller specializes in ILD and she still wanted to see Dr. Valeta Harms first. Scheduled an appointment with Dr. Valeta Harms on 05/23/2021 at 10:15am.

## 2021-05-11 ENCOUNTER — Other Ambulatory Visit: Payer: Self-pay | Admitting: Cardiovascular Disease

## 2021-05-11 ENCOUNTER — Other Ambulatory Visit: Payer: Self-pay | Admitting: Internal Medicine

## 2021-05-22 DIAGNOSIS — Z794 Long term (current) use of insulin: Secondary | ICD-10-CM | POA: Diagnosis not present

## 2021-05-22 DIAGNOSIS — E1169 Type 2 diabetes mellitus with other specified complication: Secondary | ICD-10-CM | POA: Diagnosis not present

## 2021-05-22 DIAGNOSIS — E785 Hyperlipidemia, unspecified: Secondary | ICD-10-CM | POA: Diagnosis not present

## 2021-05-22 DIAGNOSIS — J984 Other disorders of lung: Secondary | ICD-10-CM | POA: Diagnosis not present

## 2021-05-22 DIAGNOSIS — I10 Essential (primary) hypertension: Secondary | ICD-10-CM | POA: Diagnosis not present

## 2021-05-23 ENCOUNTER — Other Ambulatory Visit: Payer: Self-pay

## 2021-05-23 ENCOUNTER — Ambulatory Visit: Payer: PPO | Admitting: Pulmonary Disease

## 2021-05-23 ENCOUNTER — Encounter: Payer: Self-pay | Admitting: Pulmonary Disease

## 2021-05-23 VITALS — BP 120/80 | HR 97 | Temp 97.7°F | Ht 64.0 in | Wt 225.0 lb

## 2021-05-23 DIAGNOSIS — I272 Pulmonary hypertension, unspecified: Secondary | ICD-10-CM | POA: Diagnosis not present

## 2021-05-23 DIAGNOSIS — Z7729 Contact with and (suspected ) exposure to other hazardous substances: Secondary | ICD-10-CM | POA: Diagnosis not present

## 2021-05-23 DIAGNOSIS — Z9229 Personal history of other drug therapy: Secondary | ICD-10-CM

## 2021-05-23 DIAGNOSIS — J849 Interstitial pulmonary disease, unspecified: Secondary | ICD-10-CM | POA: Diagnosis not present

## 2021-05-23 NOTE — Progress Notes (Signed)
Synopsis: Referred in November 2019 for shortness of breath dyspnea on exertion by Velna Hatchet, MD  Subjective:   PATIENT ID: [name] GENDER: female DOB: 07/27/1945, MRN: [id number]  Chief Complaint  Patient presents with   Follow-up    Follow up. Patient says she's been short of breath for awhile and has a cough every morning.     The patient was referred by her PCP due to abnormal PFTs. She has daily cough which has been present in the morning and at night. Currently managed for chronic diastolic heart failure. She watches her weight daily. She occasionally wheezes and feels SOB with exertion. She can only walk to the mail box down hill but gets significantly short of breath with climbing the hill. Long hallways she will have to stop and rest for a minutes before continuing to go forward. Life long non-smoker. She does have OSA and wears her CPAP. Sometimes with significant congestion she cant wear the the mask. She has been on amiodarone for 2-3 years ago. Started out on 2 pills BID and now once daily. She worked in Char[employer] fund[employer] her whole life. She worked in a Equities trader for 17 years.   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  Overall her shortness of breath is currently at her baseline.  She does not have any new cough.  Denies hemoptysis.  Has been taking her amiodarone regularly.  Has not followed up yet with her cardiologist.  Patient denies shortness of breath at rest but does have some dyspnea with exertion or climbing stairs.  She has noticed some swelling in her lower extremities.  This is better when she takes her Lasix routinely.  She is compliant with her CPAP on occasion.  Usually gets a few hours per night.  OV 05/23/2021: Patient here today for follow-up.  She had work-up completed by Dr. Chase Caller back in 2020  that showed evidence of interstitial changes and interstitial lung disease on HRCT imaging.  She also had PFTs complete.  Significant exposure from cotton mill working as well as both her and her husband with show chickens.  So had exposure to bird droppings which may have played a role.  She also had a previous echocardiogram with evidence of possible pulmonary hypertension.  She is still having shortness of breath with exertion.    Past Medical History:  Diagnosis Date   Asthmatic bronchitis    CHF (congestive heart failure) ([place]) dx'd 2016   Coronary atherosclerosis of native coronary artery    a. s/p CABG 2005 Dr Roxy Manns     Hyperlipidemia    Hypertension    Hypertensive heart disease    Kidney stones    Mixed hyperlipidemia    Nephrolithiasis 06/07/2015   Paroxysmal atrial fibrillation ([place])    a. s/p Maze in 2005 b. recurrence in March 2016 with >8sec posttermination pauses s/p PPM implant     Pneumonia 1950   "double"   Presence of permanent cardiac pacemaker    Restrictive lung disease 03/03/2018   Sleep apnea    "tested; mask ordered; couldn't afford it; will get it now" (09/04/2015)   TIA (transient ischemic attack)    Type II diabetes mellitus ([place])    Uterine cancer [facility])    age 71 with partial hysterectomy     Family History  Problem Relation Age  of Onset   Hypertension Mother    Hyperlipidemia Mother    Heart disease Mother    Diabetes Father    Stroke Father    Heart disease Father    Hypertension Sister        3 sisters   Arthritis Brother    Heart attack Maternal Grandfather    Stroke Paternal Grandfather    Hypertension Daughter      Past Surgical History:  Procedure Laterality Date   AV NODE ABLATION N/A 03/28/2019   Procedure: AV NODE ABLATION;  Surgeon: Evans Lance, MD;  Location: [place] [facility];  Service: Cardiovascular;  Laterality: N/A;   CARDIAC CATHETERIZATION  2005   CORONARY ARTERY BYPASS GRAFT  2005   Dr Roxy Manns with MAZE   CYSTOSCOPY  WITH URETEROSCOPY, STONE BASKETRY AND STENT PLACEMENT Left 09/08/2014   Procedure: CYSTOSCOPY WITH URETEROSCOPY, STONE BASKETRY AND STENT PLACEMENT;  Surgeon: Cleon Gustin, MD;  [facility];  Service: Urology;  Laterality: Left;   CYSTOSCOPY/URETEROSCOPY/HOLMIUM LASER/STENT PLACEMENT Left 09/15/2014   Procedure: CYSTOSCOPY/RETROGRADE/URETEROSCOPY/HOLMIUM LASER/ STONE EXTRACTION /STENT EXCHANGE;  Surgeon: Cleon Gustin, MD;  [facility];  Service: Urology;  Laterality: Left;   HOLMIUM LASER APPLICATION Left 6/44/0347   Procedure: HOLMIUM LASER APPLICATION;  Surgeon: Cleon Gustin, MD;  [facility];  Service: Urology;  Laterality: Left;   HOLMIUM LASER APPLICATION Left 06/17/5954   Procedure: HOLMIUM LASER APPLICATION;  Surgeon: Cleon Gustin, MD;  [facility];  Service: Urology;  Laterality: Left;   INSERT / REPLACE / REMOVE PACEMAKER     LAPAROSCOPIC CHOLECYSTECTOMY     PERMANENT PACEMAKER INSERTION N/A 06/05/2014   MDT Advisa 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   VAGINAL HYSTERECTOMY  1975    Social History   Socioeconomic History   Marital status: Married    Spouse name: Not on file   Number of children: Not on file   Years of education: Not on file   Highest education level: Not on file  Occupational History   Not on file  Tobacco Use   Smoking status: Never   Smokeless tobacco: Never  Vaping Use   Vaping Use: Never used  Substance and Sexual Activity   Alcohol use: No   Drug use: No   Sexual activity: Not on file  Other Topics Concern   Not on file  Social History Narrative   Not on file   Social Determinants of Health   Financial Resource Strain: Not on file  Food Insecurity: Not on file  Transportation Needs: Not on file  Physical Activity: Not on file  Stress: Not on file  Social Connections: Not on file  Intimate Partner Violence: Not on file     Allergies  Allergen Reactions   Amiodarone      Other reaction(s): pulmonary fibrosis   Gabapentin     Other reaction(s): edema     Outpatient Medications Prior to Visit  Medication Sig Dispense Refill   acetaminophen (TYLENOL) 500 MG tablet Take 500-1,000 mg by mouth every 6 (six) hours as needed for moderate pain.     apixaban (ELIQUIS) 5 MG TABS tablet TAKE ONE TABLET TWICE DAILY 60 tablet 3   B Complex-Biotin-FA (B-COMPLEX PO) Take 1 capsule by mouth every morning.      BIOTIN 5000 PO Take 1 tablet by mouth daily.     Cholecalciferol (VITAMIN D3) 1000 UNITS CAPS Take 1,000 Units by mouth daily.  Coenzyme Q10 (CO Q-10) 200 MG CAPS Take 200 mg by mouth daily.     diphenhydramine-acetaminophen (TYLENOL PM) 25-500 MG TABS tablet Take 1 tablet by mouth at bedtime as needed (pain/sleep).      fenofibrate 160 MG tablet TAKE 1 TABLET DAILY 90 tablet 0   furosemide (LASIX) 40 MG tablet TAKE (1) TABLET TWICE A DAY. 180 tablet 0   HUMALOG MIX 75/25 KWIKPEN (75-25) 100 UNIT/ML Kwikpen Inject 34-38 Units into the skin in the morning, at noon, and at bedtime.     losartan (COZAAR) 50 MG tablet TAKE 1 TABLET UPTO TWICE A DAY AS DIRECTED 180 tablet 1   metFORMIN (GLUCOPHAGE) 1000 MG tablet Take 1,000 mg by mouth 2 (two) times daily with a meal.      metolazone (ZAROXOLYN) 2.5 MG tablet Take 2.5 mg by mouth daily as needed.     metoprolol succinate (TOPROL-XL) 50 MG 24 hr tablet Take 50 mg ( 1 tablet) in the morning  and 25 mg ( 1/2 tablet ) in the evening 135 tablet 3   potassium chloride (KLOR-CON) 10 MEQ tablet Take 20 mEq by mouth daily.     amitriptyline (ELAVIL) 10 MG tablet Take 1 tablet by mouth at bedtime.     No facility-administered medications prior to visit.    Review of Systems  Constitutional:  Negative for chills, fever, malaise/fatigue and weight loss.  HENT:  Negative for hearing loss, sore throat and tinnitus.   Eyes:  Negative for blurred vision and double vision.  Respiratory:  Positive for shortness of breath.  Negative for cough, hemoptysis, sputum production, wheezing and stridor.   Cardiovascular:  Negative for chest pain, palpitations, orthopnea, leg swelling and PND.  Gastrointestinal:  Negative for abdominal pain, constipation, diarrhea, heartburn, nausea and vomiting.  Genitourinary:  Negative for dysuria, hematuria and urgency.  Musculoskeletal:  Negative for joint pain and myalgias.  Skin:  Negative for itching and rash.  Neurological:  Negative for dizziness, tingling, weakness and headaches.  Endo/Heme/Allergies:  Negative for environmental allergies. Does not bruise/bleed easily.  Psychiatric/Behavioral:  Negative for depression. The patient is not nervous/anxious and does not have insomnia.   All other systems reviewed and are negative.   Objective:  Physical Exam Vitals reviewed.  Constitutional:      General: She is not in acute distress.    Appearance: She is well-developed.  HENT:     Head: Normocephalic and atraumatic.  Eyes:     General: No scleral icterus.    Conjunctiva/sclera: Conjunctivae normal.     Pupils: Pupils are equal, round, and reactive to light.  Neck:     Vascular: No JVD.     Trachea: No tracheal deviation.  Cardiovascular:     Rate and Rhythm: Normal rate and regular rhythm.     Heart sounds: Normal heart sounds. No murmur heard. Pulmonary:     Effort: Pulmonary effort is normal. No tachypnea, accessory muscle usage or respiratory distress.     Breath sounds: No stridor. No wheezing, rhonchi or rales.     Comments: Bilateral inspiratory crackles Abdominal:     General: There is no distension.     Palpations: Abdomen is soft.     Tenderness: There is no abdominal tenderness.  Musculoskeletal:        General: No tenderness.     Cervical back: Neck supple.  Lymphadenopathy:     Cervical: No cervical adenopathy.  Skin:    General: Skin is warm and dry.  Capillary Refill: Capillary refill takes less than 2 seconds.     Findings: No rash.   Neurological:     Mental Status: She is alert and oriented to person, place, and time.  Psychiatric:        Behavior: Behavior normal.     Vitals:   05/23/21 1015  BP: 120/80  Pulse: 97  Temp: 97.7 F (36.5 C)  TempSrc: Oral  SpO2: 96%  Weight: 225 lb (102.1 kg)  Height: _0  (1.626 m)   96% on RA BMI Readings from Last 3 Encounters:  05/23/21 38.62 kg/m  02/25/21 39.48 kg/m  05/03/20 39.07 kg/m   Wt Readings from Last 3 Encounters:  05/23/21 225 lb (102.1 kg)  02/25/21 230 lb (104.3 kg)  05/03/20 227 lb 9.6 oz (103.2 kg)    CBC    Component Value Date/Time   WBC 10.9 (H) 03/24/2019 1237   WBC 9.5 12/12/2018 0321   RBC 4.13 03/24/2019 1237   RBC 3.83 (L) 12/12/2018 0321   HGB 12.2 03/24/2019 1237   HCT 36.5 03/24/2019 1237   PLT 381 03/24/2019 1237   MCV 88 03/24/2019 1237   MCH 29.5 03/24/2019 1237   MCH 29.8 12/12/2018 0321   MCHC 33.4 03/24/2019 1237   MCHC 31.7 12/12/2018 0321   RDW 14.0 03/24/2019 1237   LYMPHSABS 3.7 12/12/2018 0321   MONOABS 0.7 12/12/2018 0321   EOSABS 0.2 12/12/2018 0321   BASOSABS 0.1 12/12/2018 0321   Chest Imaging: Chest x-ray 2017: Enlarged cardiac silhouette bilateral pulmonary edema The patient's images have been independently reviewed by me.    High-resolution CT of the chest December 2019: Patient with bilateral fibrotic interstitial lung disease, patchy groundglass opacities and reticulation without a clear apical basilar gradient.  Findings are new compared to prior CT imaging.  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 The patient's images have been independently reviewed by me.    HRCT imaging 12/11/2018: Bilateral interstitial opacities concerning for interstitial lung disease possible drug-induced ILD versus chronic HP. The patient's images have been independently reviewed by me.     Pulmonary Functions Testing Results: PFT Results Latest Ref Rng & Units 03/01/2019 12/12/2018 12/28/2017   FVC-Pre L 1.55 1.23 -  FVC-Predicted Pre % 54 42 56  FVC-Post L - - 1.61  FVC-Predicted Post % - - 55  Pre FEV1/FVC % % 92 92 91  Post FEV1/FCV % % - - 91  FEV1-Pre L 1.43 1.12 1.49  FEV1-Predicted Pre % 66 52 68  FEV1-Post L - - 1.47  DLCO uncorrected ml/min/mmHg 18.22 11.35 15.97  DLCO UNC% % 95 59 67  DLCO corrected ml/min/mmHg - 12.17 -  DLCO COR %Predicted % - 64 -  DLVA Predicted % 155 152 121  TLC L - - 2.93  TLC % Predicted % - - 58  RV % Predicted % - - 52    FeNO: No results found for: NITRICOXIDE  Pathology: None   Echocardiogram: None   Heart Catheterization: None     Assessment & Plan:   ILD (interstitial lung disease) ([place]) - Plan: CT CHEST HIGH RESOLUTION, ECHOCARDIOGRAM COMPLETE  Long-term exposure involving bird droppings - Plan: CT CHEST HIGH RESOLUTION, ECHOCARDIOGRAM COMPLETE  History of amiodarone therapy - Plan: CT CHEST HIGH RESOLUTION, ECHOCARDIOGRAM COMPLETE  Pulmonary hypertension (HCC)  Discussion:  This is a 76 year old female longstanding history of amiodarone use, cotton mill exposure, bird droppings exposure now with interstitial lung disease.  She possibly has underlying pulmonary hypertension as  well.  She has a previous echocardiogram with right ventricular pressure overload.  Plan: I think she needs a repeat HRCT to see if there is been progression or any worsening fibrotic change. She also needs a repeat echocardiogram since both of been greater than 3 years. I also think that it would benefit from her having a consultation with Dr. Silas Flood for evaluation of her pulmonary hypertension. She may benefit from right heart catheterization at some point. Additionally there are medications now available in the market for use in conjunction with interstitial lung disease and pulmonary hypertension. She is also in agreements to follow-up with Dr. Chase Caller in the future if needed.  I would wait and see what her HRCT imaging looks like  before that. Return in a couple of weeks for new consult with Dr. Silas Flood to review pH.   Current Outpatient Medications:    acetaminophen (TYLENOL) 500 MG tablet, Take 500-1,000 mg by mouth every 6 (six) hours as needed for moderate pain., Disp: , Rfl:    apixaban (ELIQUIS) 5 MG TABS tablet, TAKE ONE TABLET TWICE DAILY, Disp: 60 tablet, Rfl: 3   B Complex-Biotin-FA (B-COMPLEX PO), Take 1 capsule by mouth every morning. , Disp: , Rfl:    BIOTIN 5000 PO, Take 1 tablet by mouth daily., Disp: , Rfl:    Cholecalciferol (VITAMIN D3) 1000 UNITS CAPS, Take 1,000 Units by mouth daily., Disp: , Rfl:    Coenzyme Q10 (CO Q-10) 200 MG CAPS, Take 200 mg by mouth daily., Disp: , Rfl:    diphenhydramine-acetaminophen (TYLENOL PM) 25-500 MG TABS tablet, Take 1 tablet by mouth at bedtime as needed (pain/sleep). , Disp: , Rfl:    fenofibrate 160 MG tablet, TAKE 1 TABLET DAILY, Disp: 90 tablet, Rfl: 0   furosemide (LASIX) 40 MG tablet, TAKE (1) TABLET TWICE A DAY., Disp: 180 tablet, Rfl: 0   HUMALOG MIX 75/25 KWIKPEN (75-25) 100 UNIT/ML Kwikpen, Inject 34-38 Units into the skin in the morning, at noon, and at bedtime., Disp: , Rfl:    losartan (COZAAR) 50 MG tablet, TAKE 1 TABLET UPTO TWICE A DAY AS DIRECTED, Disp: 180 tablet, Rfl: 1   metFORMIN (GLUCOPHAGE) 1000 MG tablet, Take 1,000 mg by mouth 2 (two) times daily with a meal. , Disp: , Rfl:    metolazone (ZAROXOLYN) 2.5 MG tablet, Take 2.5 mg by mouth daily as needed., Disp: , Rfl:    metoprolol succinate (TOPROL-XL) 50 MG 24 hr tablet, Take 50 mg ( 1 tablet) in the morning  and 25 mg ( 1/2 tablet ) in the evening, Disp: 135 tablet, Rfl: 3   potassium chloride (KLOR-CON) 10 MEQ tablet, Take 20 mEq by mouth daily., Disp: , Rfl:    Garner Nash, DO [facility] 05/23/2021 11:04 AM

## 2021-05-23 NOTE — Patient Instructions (Signed)
Thank you for visiting Dr. Valeta Harms at [facility]. ?Today we recommend the following: ? ?Orders Placed This Encounter  ?Procedures  ? CT CHEST HIGH RESOLUTION  ? ECHOCARDIOGRAM COMPLETE  ? ?Return in about 4 weeks (around 06/20/2021) for w/ Dr. Silas Flood New Consult for Pulmonary Hypertension . ? ? ? ?Please do your part to reduce the spread of COVID-19.  ?

## 2021-05-27 DIAGNOSIS — E1169 Type 2 diabetes mellitus with other specified complication: Secondary | ICD-10-CM | POA: Diagnosis not present

## 2021-05-29 ENCOUNTER — Other Ambulatory Visit: Payer: Self-pay

## 2021-05-29 ENCOUNTER — Ambulatory Visit (INDEPENDENT_AMBULATORY_CARE_PROVIDER_SITE_OTHER)
Admission: RE | Admit: 2021-05-29 | Discharge: 2021-05-29 | Disposition: A | Discharge status: Home / Self Care | Payer: PPO | Source: Ambulatory Visit | Attending: Pulmonary Disease | Admitting: Pulmonary Disease

## 2021-05-29 DIAGNOSIS — I2510 Atherosclerotic heart disease of native coronary artery without angina pectoris: Secondary | ICD-10-CM | POA: Diagnosis not present

## 2021-05-29 DIAGNOSIS — Z9229 Personal history of other drug therapy: Secondary | ICD-10-CM

## 2021-05-29 DIAGNOSIS — J849 Interstitial pulmonary disease, unspecified: Secondary | ICD-10-CM | POA: Diagnosis not present

## 2021-05-29 DIAGNOSIS — Z7729 Contact with and (suspected ) exposure to other hazardous substances: Secondary | ICD-10-CM

## 2021-05-29 DIAGNOSIS — I700 Atherosclerosis of aorta: Secondary | ICD-10-CM | POA: Diagnosis not present

## 2021-06-02 NOTE — Progress Notes (Addendum)
? ?Cardiology Office Note ?Date:  06/02/2021  ?Patient ID:  [name], [name] [birth year], MRN [id number] ?PCP:  Velna Hatchet, MD  ?Cardiologist:  Dr. Claiborne Billings ?Electrophysiologist: Dr. [name] ? ?  ?Chief Complaint:   annual EP visit ? ?History of Present Illness: ?[name] is a 76 y.o. female with history of HTN, HLD, TIA, DM, Afib, CAD (CABG and MAZE 2005), post termination pauses >> PPM, ILD, OSA w/CPAP. ? 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  The pt apparently with h/o being bird Conservation officer, historic buildings, Sales executive breeder, Firefighter, Engineer, manufacturing systems, Museum/gallery exhibitions officer, amiodarone  She was having difficulty accepting her lung disease likely 2/2 bird exposures. ?She had stable DOE (hills, stairs, or mod exertion), was not interested in pursuing broncoscopy, biopsy, of genomic evaluation, wanted to avoid steroid therapy. PFTs stable.  Her HR was noted elevated (130's) and recommended to f/u with cardiology ? ?She comes today to be seen for Dr. [name], last seen by him Feb 2022, she was doing well, had lost quite a bit of weight, no changes were made. ? ?More recently saw Dr. Claiborne Billings, Dec 2022, c/o fatigue and some LE swelling.  Exam noted unequal BPs with plans for UE vascular US.  Her metoprolol reduced to try and improve her fatigue. ?(left arm was 116/70 and on her right arm was 86/60) ? ?US/studies reviewed by Dr. 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 No new recommendations. ? ?She saw Dr. Valeta Harms 05/23/21, planned for updated High Res CT and echo and Dr. Silas Flood for p.HTN perhaps need for RHC in the future. ? ? ?TODAY ?She is accompanied by her daughter ?Cardiac-wise she thinks she is doing OK, no CP, palpitations or cardiac  awareness. ?NO dizzy spells, near syncope or syncope. ?She has chronic SOB, and following closely with pulmonary ?No bleeding or signs of bleeding ? ? ?Device information ?MDT dual chamber PPM, implanted 06/05/2014 ?SHE IS VERY SYMPTOMATIC with LOC ? ? ?AFib hx ?MAZE 2005 ?AV node ablation Jan 2021 ?AAD  ?Amiodarone stopped Oct 2019 2/2 concerns of pulmonary tox ? ? ?Past Medical History:  ?Diagnosis Date  ? Asthmatic bronchitis   ? CHF (congestive heart failure) ([place]) dx'd 2016  ? Coronary atherosclerosis of native coronary artery   ? a. s/p CABG 2005 Dr Roxy Manns    ? Hyperlipidemia   ? Hypertension   ? Hypertensive heart disease   ? Kidney stones   ? Mixed hyperlipidemia   ? Nephrolithiasis 06/07/2015  ? Paroxysmal atrial fibrillation (HCC)   ? a. s/p Maze in 2005 b. recurrence in March 2016 with >8sec posttermination pauses s/p PPM implant    ? Pneumonia 1950  ? "double"  ? Presence of permanent cardiac pacemaker   ? Restrictive lung disease 03/03/2018  ? Sleep apnea   ? "tested; mask ordered; couldn't afford it; will get it now" (09/04/2015)  ? TIA (transient ischemic attack)   ? Type II diabetes mellitus ([place])   ? Uterine cancer ([place])   ? age 39 with partial hysterectomy  ? ? ?Past Surgical History:  ?Procedure Laterality Date  ? AV NODE ABLATION N/A 03/28/2019  ? Procedure: AV NODE ABLATION;  Surgeon: Evans Lance, MD;  Location: [place] [facility];  Service: Cardiovascular;  Laterality: N/A;  ? CARDIAC  CATHETERIZATION  2005  ? CORONARY ARTERY BYPASS GRAFT  2005  ? Dr Roxy Manns with MAZE  ? CYSTOSCOPY WITH URETEROSCOPY, STONE BASKETRY AND STENT PLACEMENT Left 09/08/2014  ? Procedure: CYSTOSCOPY WITH URETEROSCOPY, STONE BASKETRY AND STENT PLACEMENT;  Surgeon: Cleon Gustin, MD;  [facility];  Service: Urology;  Laterality: Left;  ? CYSTOSCOPY/URETEROSCOPY/HOLMIUM LASER/STENT PLACEMENT Left 09/15/2014  ? Procedure: CYSTOSCOPY/RETROGRADE/URETEROSCOPY/HOLMIUM LASER/ STONE EXTRACTION /STENT EXCHANGE;  Surgeon: Cleon Gustin, MD;  [facility];  Service: Urology;  Laterality: Left;  ? HOLMIUM LASER APPLICATION Left 5/36/6440  ? Procedure: HOLMIUM LASER APPLICATION;  Surgeon: Cleon Gustin, MD;  [facility];  Service: Urology;  Laterality: Left;  ? HOLMIUM LASER APPLICATION Left 05/19/7423  ? Procedure: HOLMIUM LASER APPLICATION;  Surgeon: Cleon Gustin, MD;  [facility];  Service: Urology;  Laterality: Left;  ? INSERT / REPLACE / REMOVE PACEMAKER    ? LAPAROSCOPIC CHOLECYSTECTOMY    ? PERMANENT PACEMAKER INSERTION N/A 06/05/2014  ? 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 VAGINAL HYSTERECTOMY  1975  ? ? ?Current Outpatient Medications  ?Medication Sig Dispense Refill  ? acetaminophen (TYLENOL) 500 MG tablet Take 500-1,000 mg by mouth every 6 (six) hours as needed for moderate pain.    ? apixaban (ELIQUIS) 5 MG TABS tablet TAKE ONE TABLET TWICE DAILY 60 tablet 3  ? B Complex-Biotin-FA (B-COMPLEX PO) Take 1 capsule by mouth every morning.     ? BIOTIN 5000 PO Take 1 tablet by mouth daily.    ? Cholecalciferol (VITAMIN D3) 1000 UNITS CAPS Take 1,000 Units by mouth daily.    ? Coenzyme Q10 (CO Q-10) 200 MG CAPS Take 200 mg by mouth daily.    ? diphenhydramine-acetaminophen (TYLENOL PM) 25-500 MG TABS tablet Take 1 tablet by mouth at bedtime as needed (pain/sleep).     ? fenofibrate 160 MG tablet TAKE 1 TABLET DAILY 90 tablet 0  ? furosemide (LASIX) 40 MG tablet TAKE (1) TABLET TWICE A DAY. 180 tablet 0  ? HUMALOG MIX 75/25 KWIKPEN (75-25) 100 UNIT/ML Kwikpen Inject 34-38 Units into the skin in the morning, at noon, and at bedtime.    ? losartan (COZAAR) 50 MG tablet TAKE 1 TABLET UPTO TWICE A DAY AS DIRECTED 180 tablet 1  ? metFORMIN (GLUCOPHAGE) 1000 MG tablet Take 1,000 mg by mouth 2 (two) times daily with a meal.     ? metolazone (ZAROXOLYN) 2.5 MG tablet Take 2.5 mg by mouth daily as needed.    ? metoprolol succinate (TOPROL-XL) 50 MG 24 hr tablet Take 50 mg ( 1  tablet) in the morning  and 25 mg ( 1/2 tablet ) in the evening 135 tablet 3  ? potassium chloride (KLOR-CON) 10 MEQ tablet Take 20 mEq by mouth daily.    ? ?No current facility-administered medications for this visit.  ? ? ?Allergies:   Amiodarone and Gabapentin  ? ?Social History:  The patient  reports that she has never smoked. She has never used smokeless tobacco. She reports that she does not drink alcohol and does not use drugs.  ? ?Family History:  The patient's family history includes Arthritis in her brother; Diabetes in her father; Heart attack in her maternal grandfather; Heart disease in her father and mother; Hyperlipidemia in her mother; Hypertension in her daughter, mother, and sister; Stroke in her father and paternal grandfather. ? ?ROS:  Please see the history of present illness.  ?All other  systems are reviewed and otherwise negative.  ? ?PHYSICAL EXAM:  ?VS:  There were no vitals taken for this visit. BMI: There is no height or weight on file to calculate BMI. ?Well nourished, well developed, in no acute distress  ?HEENT: normocephalic, atraumatic  ?Neck: no JVD, carotid bruits or masses ?Cardiac:  RRR,(paced); no significant murmurs, no rubs, or gallops ?Lungs:  CTA b/l, no wheezing, rhonchi or rales  ?Abd: soft, nontender, obese ?MS: no deformity or atrophy ?Ext: trace- 1+ edema only ?Skin: warm and dry, no rash ?Neuro:  No gross deficits appreciated ?Psych: euthymic mood, full affect ? ?PPM site is stable, no tethering or discomfort ? ? ?EKG:  Not done today ? ?PPM interrogation done today and reviewed by myself:  ?Battery and lead measurements are good ?She is 100% AF for the last year ?Programmed VVIR ? ? ?04/30/21: UE arterial doppler ?Summary:  ?   ?Right: No significant arterial obstruction detected in the right  ?       upper extremity.  ?Left: No significant arterial obstruction detected in the left upper  ?      extremity.  ?*See table(s) above for measurements and  observations ? ? ?04/30/21: Carotid US ?Summary:  ?Right Carotid: Velocities in the right ICA are consistent with a 1-39%  ?stenosis.  ?               The ECA appears >50% stenosed.  ? ?Left Carotid: Velocities in the left

## 2021-06-04 ENCOUNTER — Other Ambulatory Visit: Payer: Self-pay

## 2021-06-04 ENCOUNTER — Ambulatory Visit: Payer: PPO | Admitting: Physician Assistant

## 2021-06-04 ENCOUNTER — Encounter: Payer: Self-pay | Admitting: Physician Assistant

## 2021-06-04 VITALS — BP 122/70 | HR 84 | Ht 64.0 in | Wt 230.0 lb

## 2021-06-04 DIAGNOSIS — I4821 Permanent atrial fibrillation: Secondary | ICD-10-CM

## 2021-06-04 DIAGNOSIS — Z950 Presence of cardiac pacemaker: Secondary | ICD-10-CM | POA: Diagnosis not present

## 2021-06-04 DIAGNOSIS — I442 Atrioventricular block, complete: Secondary | ICD-10-CM | POA: Diagnosis not present

## 2021-06-04 DIAGNOSIS — I2510 Atherosclerotic heart disease of native coronary artery without angina pectoris: Secondary | ICD-10-CM | POA: Diagnosis not present

## 2021-06-04 DIAGNOSIS — I10 Essential (primary) hypertension: Secondary | ICD-10-CM | POA: Diagnosis not present

## 2021-06-04 LAB — CUP PACEART INCLINIC DEVICE CHECK
Battery Remaining Longevity: 19 mo
Battery Voltage: 2.92 V
Brady Statistic RA Percent Paced: 0.5 %
Brady Statistic RV Percent Paced: 98.43 %
Date Time Interrogation Session: 20230321132907
Implantable Lead Implant Date: 20160321
Implantable Lead Implant Date: 20160321
Implantable Lead Location: 753859
Implantable Lead Location: 753860
Implantable Lead Model: 5076
Implantable Lead Model: 5076
Implantable Pulse Generator Implant Date: 20160321
Lead Channel Impedance Value: 361 Ohm
Lead Channel Impedance Value: 456 Ohm
Lead Channel Impedance Value: 494 Ohm
Lead Channel Impedance Value: 494 Ohm
Lead Channel Pacing Threshold Amplitude: 0.625 V
Lead Channel Pacing Threshold Amplitude: 0.875 V
Lead Channel Pacing Threshold Pulse Width: 0.4 ms
Lead Channel Pacing Threshold Pulse Width: 0.4 ms
Lead Channel Sensing Intrinsic Amplitude: 0.5 mV
Lead Channel Sensing Intrinsic Amplitude: 1 mV
Lead Channel Sensing Intrinsic Amplitude: 16 mV
Lead Channel Sensing Intrinsic Amplitude: 16 mV
Lead Channel Setting Pacing Amplitude: 2.5 V
Lead Channel Setting Pacing Pulse Width: 0.4 ms
Lead Channel Setting Sensing Sensitivity: 4 mV

## 2021-06-04 NOTE — Patient Instructions (Signed)
Medication Instructions:  ? ?Your physician recommends that you continue on your current medications as directed. Please refer to the Current Medication list given to you today. ? ?*If you need a refill on your cardiac medications before your next appointment, please call your pharmacy* ? ? ?Lab Work: [place] ? ? ?If you have labs (blood work) drawn today and your tests are completely normal, you will receive your results only by: ?MyChart Message (if you have MyChart) OR ?A paper copy in the mail ?If you have any lab test that is abnormal or we need to change your treatment, we will call you to review the results. ? ? ?Testing/Procedures: NONE ORDERED  TODAY ? ? ? ?Follow-Up: ?At [facility], you and your health needs are our priority.  As part of our continuing mission to provide you with exceptional heart care, we have created designated Provider Care Teams.  These Care Teams include your primary Cardiologist (physician) and Advanced Practice Providers (APPs -  Physician Assistants and Nurse Practitioners) who all work together to provide you with the care you need, when you need it. ? ?We recommend signing up for the patient portal called "MyChart".  Sign up information is provided on this After Visit Summary.  MyChart is used to connect with patients for Virtual Visits (Telemedicine).  Patients are able to view lab/test results, encounter notes, upcoming appointments, etc.  Non-urgent messages can be sent to your provider as well.   ?To learn more about what you can do with MyChart, go to [email or web address].   ? ?Your next appointment:   ?1 year(s) ? ?The format for your next appointment:   ?In Person ? ?Provider:   ?You may see Cristopher Peru, MD or one of the following Advanced Practice Providers on your designated Care Team:   ?Tommye Standard, PA-C ?Legrand Como "Jonni Sanger" [place], PA-C  ? ?Other Instructions ? ?

## 2021-06-10 ENCOUNTER — Ambulatory Visit (HOSPITAL_COMMUNITY)
Admission: RE | Admit: 2021-06-10 | Discharge: 2021-06-10 | Disposition: A | Discharge status: Home / Self Care | Payer: PPO | Source: Ambulatory Visit | Attending: Pulmonary Disease | Admitting: Pulmonary Disease

## 2021-06-10 ENCOUNTER — Other Ambulatory Visit: Payer: Self-pay

## 2021-06-10 DIAGNOSIS — J849 Interstitial pulmonary disease, unspecified: Secondary | ICD-10-CM | POA: Insufficient documentation

## 2021-06-10 DIAGNOSIS — I110 Hypertensive heart disease with heart failure: Secondary | ICD-10-CM | POA: Insufficient documentation

## 2021-06-10 DIAGNOSIS — I509 Heart failure, unspecified: Secondary | ICD-10-CM | POA: Diagnosis not present

## 2021-06-10 DIAGNOSIS — I272 Pulmonary hypertension, unspecified: Secondary | ICD-10-CM | POA: Diagnosis not present

## 2021-06-10 DIAGNOSIS — E785 Hyperlipidemia, unspecified: Secondary | ICD-10-CM | POA: Diagnosis not present

## 2021-06-10 DIAGNOSIS — E119 Type 2 diabetes mellitus without complications: Secondary | ICD-10-CM | POA: Diagnosis not present

## 2021-06-10 DIAGNOSIS — Z950 Presence of cardiac pacemaker: Secondary | ICD-10-CM | POA: Diagnosis not present

## 2021-06-10 DIAGNOSIS — Z7729 Contact with and (suspected ) exposure to other hazardous substances: Secondary | ICD-10-CM | POA: Diagnosis not present

## 2021-06-10 DIAGNOSIS — Z8673 Personal history of transient ischemic attack (TIA), and cerebral infarction without residual deficits: Secondary | ICD-10-CM | POA: Insufficient documentation

## 2021-06-10 DIAGNOSIS — Z9229 Personal history of other drug therapy: Secondary | ICD-10-CM | POA: Diagnosis not present

## 2021-06-10 LAB — ECHOCARDIOGRAM COMPLETE
Area-P 1/2: 2.12 cm2
MV VTI: 1.24 cm2
S' Lateral: 3.1 cm

## 2021-06-10 MED ORDER — PERFLUTREN LIPID MICROSPHERE
1.0000 mL | INTRAVENOUS | Status: AC | PRN
  Administered 2021-06-10: 2 mL via INTRAVENOUS
  Filled 2021-06-10: qty 10

## 2021-06-18 ENCOUNTER — Ambulatory Visit (INDEPENDENT_AMBULATORY_CARE_PROVIDER_SITE_OTHER): Payer: PPO

## 2021-06-18 DIAGNOSIS — I442 Atrioventricular block, complete: Secondary | ICD-10-CM

## 2021-06-18 LAB — CUP PACEART REMOTE DEVICE CHECK
Battery Remaining Longevity: 18 mo
Battery Voltage: 2.92 V
Brady Statistic RA Percent Paced: 0 %
Brady Statistic RV Percent Paced: 98.1 %
Date Time Interrogation Session: 20230404083334
Implantable Lead Implant Date: 20160321
Implantable Lead Implant Date: 20160321
Implantable Lead Location: 753859
Implantable Lead Location: 753860
Implantable Lead Model: 5076
Implantable Lead Model: 5076
Implantable Pulse Generator Implant Date: 20160321
Lead Channel Impedance Value: 361 Ohm
Lead Channel Impedance Value: 456 Ohm
Lead Channel Impedance Value: 475 Ohm
Lead Channel Impedance Value: 494 Ohm
Lead Channel Pacing Threshold Amplitude: 0.5 V
Lead Channel Pacing Threshold Amplitude: 0.875 V
Lead Channel Pacing Threshold Pulse Width: 0.4 ms
Lead Channel Pacing Threshold Pulse Width: 0.4 ms
Lead Channel Sensing Intrinsic Amplitude: 0.5 mV
Lead Channel Sensing Intrinsic Amplitude: 0.5 mV
Lead Channel Sensing Intrinsic Amplitude: 5.5 mV
Lead Channel Sensing Intrinsic Amplitude: 5.5 mV
Lead Channel Setting Pacing Amplitude: 2.5 V
Lead Channel Setting Pacing Pulse Width: 0.4 ms
Lead Channel Setting Sensing Sensitivity: 4 mV

## 2021-07-01 ENCOUNTER — Encounter: Payer: Self-pay | Admitting: Pulmonary Disease

## 2021-07-01 ENCOUNTER — Ambulatory Visit: Payer: PPO | Admitting: Pulmonary Disease

## 2021-07-01 VITALS — BP 122/66 | HR 98 | Temp 98.5°F | Ht 64.0 in | Wt 227.2 lb

## 2021-07-01 DIAGNOSIS — J849 Interstitial pulmonary disease, unspecified: Secondary | ICD-10-CM

## 2021-07-01 NOTE — Patient Instructions (Addendum)
Nice to meet you ? ?The main reason for seeing me today is out of concern for a condition called pulmonary hypertension.  This means high blood pressure going into the lungs.  Over time this puts stress on the weaker side of the heart and causes fluid overload etc. ? ?I am worried that you have many reasons to have this condition.  1) your heart ultrasounds in the past and more recently have shown signs of extra fluid backing up from the pumping side or left side of the heart.  In addition, sleep apnea has been untreated can contribute to this.  In addition, scarring or shadows in the lungs which we see are worsening on your CT scan can contribute to this.  Also, if your oxygen is dropping low oxygen can contribute to this. ? ?For further information I recommend getting PFTs or pulmonary function test.  We will try to get the scheduled in the next few weeks and follow-up with me for further discussion. ? ?In addition, I recommend we walk around the clinic today to see if your oxygen drops as low oxygen can cause pulmonary hypertension as discussed above. ? ?We may decide that medicines are not in your best interest.  If we decide to move forward with medicines we will need to consider or certainly we will have to have a right heart catheterization performed to get pressures and measurements to help know the best medicines to use.  We can talk about this at next visit. ? ?Return to clinic in 4 weeks or sooner as needed with Dr. Silas Flood ? ? ? ? ?

## 2021-07-03 NOTE — Progress Notes (Signed)
Remote pacemaker transmission.  ? ?

## 2021-07-09 ENCOUNTER — Telehealth: Payer: Self-pay | Admitting: Pulmonary Disease

## 2021-07-10 ENCOUNTER — Telehealth: Payer: Self-pay | Admitting: Pulmonary Disease

## 2021-07-10 DIAGNOSIS — J849 Interstitial pulmonary disease, unspecified: Secondary | ICD-10-CM

## 2021-07-10 NOTE — Telephone Encounter (Signed)
Called and spoke to [facility] the [facility]. I advised her that the order was placed on [name] 17,23 in office for a POC. Gave her the number for [facility] to see if she wanted to call them to figure out what the hold up was. Nothing further needed  ?

## 2021-07-11 NOTE — Telephone Encounter (Signed)
Resubmitted new POC order since [facility] does not carry POC devices. Called patient and updated her that I placed a new order for a POC. Told her I would keep her updated with this order as soon as I hear something. Nothing further needed  ?

## 2021-07-17 DIAGNOSIS — Z794 Long term (current) use of insulin: Secondary | ICD-10-CM | POA: Diagnosis not present

## 2021-07-17 DIAGNOSIS — D692 Other nonthrombocytopenic purpura: Secondary | ICD-10-CM | POA: Diagnosis not present

## 2021-07-17 DIAGNOSIS — G720 Drug-induced myopathy: Secondary | ICD-10-CM | POA: Diagnosis not present

## 2021-07-17 DIAGNOSIS — I4891 Unspecified atrial fibrillation: Secondary | ICD-10-CM | POA: Diagnosis not present

## 2021-07-17 DIAGNOSIS — D6869 Other thrombophilia: Secondary | ICD-10-CM | POA: Diagnosis not present

## 2021-07-17 DIAGNOSIS — I5020 Unspecified systolic (congestive) heart failure: Secondary | ICD-10-CM | POA: Diagnosis not present

## 2021-07-17 DIAGNOSIS — I272 Pulmonary hypertension, unspecified: Secondary | ICD-10-CM | POA: Diagnosis not present

## 2021-07-17 DIAGNOSIS — E785 Hyperlipidemia, unspecified: Secondary | ICD-10-CM | POA: Diagnosis not present

## 2021-07-17 DIAGNOSIS — Z950 Presence of cardiac pacemaker: Secondary | ICD-10-CM | POA: Diagnosis not present

## 2021-07-17 DIAGNOSIS — I10 Essential (primary) hypertension: Secondary | ICD-10-CM | POA: Diagnosis not present

## 2021-07-17 DIAGNOSIS — E1169 Type 2 diabetes mellitus with other specified complication: Secondary | ICD-10-CM | POA: Diagnosis not present

## 2021-07-17 DIAGNOSIS — J849 Interstitial pulmonary disease, unspecified: Secondary | ICD-10-CM | POA: Diagnosis not present

## 2021-07-19 DIAGNOSIS — M542 Cervicalgia: Secondary | ICD-10-CM | POA: Diagnosis not present

## 2021-07-19 DIAGNOSIS — M25512 Pain in left shoulder: Secondary | ICD-10-CM | POA: Diagnosis not present

## 2021-07-29 ENCOUNTER — Telehealth: Payer: Self-pay | Admitting: Pulmonary Disease

## 2021-07-29 NOTE — Telephone Encounter (Signed)
Called [name] from [facility] and informed him that I do have the fax from them regarding the POC device. Will have [facility] sign and fax when completed. Nothing further needed  ?

## 2021-08-13 ENCOUNTER — Other Ambulatory Visit: Payer: Self-pay | Admitting: Internal Medicine

## 2021-08-13 ENCOUNTER — Other Ambulatory Visit: Payer: Self-pay | Admitting: Cardiovascular Disease

## 2021-08-20 DIAGNOSIS — J849 Interstitial pulmonary disease, unspecified: Secondary | ICD-10-CM | POA: Diagnosis not present

## 2021-08-22 DIAGNOSIS — J849 Interstitial pulmonary disease, unspecified: Secondary | ICD-10-CM | POA: Diagnosis not present

## 2021-08-25 NOTE — Progress Notes (Signed)
_0  ID: [name], female    DOB: 05-23-1945, 76 y.o.   MRN: [id number]  Chief Complaint  Patient presents with   New Patient (Initial Visit)    New pt from [facility]. Pt states she not as coughing as much. Pt states when she is up walking around she is ok but when she rushes she gets SOB.     Referring provider: Velna Hatchet, MD  HPI:   76 y.o. woman whom we are seeing for evaluation of pulmonary hypertension.  Most recent pulmonary note from Dr. Valeta Harms reviewed.  Most recent cardiology note reviewed.  Patiently recently reestablished with pulmonary clinic.  Increased cough.  Some dyspnea on exertion as well.  In the past and followed with Dr. Chase Caller.  CT scan with interstitial changes, concern for hypersensitivity pneumonitis.  Did not follow-up.  Seen recently.  CT high-res shows worsening interlobular septal thickening, mosaicism, scattered nodular opacities concerning for hypersensitivity pneumonitis, sarcoidosis considered.  PA appeared enlarged on that CT scan.  This prompted TTE which demonstrated RV dysfunction, RA dilation.  Review of TTE dating back to 2020 shows RV dysfunction.  Not present in 2018.  She has dyspnea.  With relatively robust exertion.  Normal walking outside bed.  Worse with inclines or stairs.  Worsening a bit over the last several months to year or 2.  PMH: Atrial fibrillation, hypertension, diastolic congestive heart failure, CAD Surgical history: CABG, cholecystectomy Family history:Mother with hypertension, CAD, hyperlipidemia, father with diabetes, CAD, CVA Social history: Never smoker, lives in [place] / Pulmonary Flowsheets:   ACT:      No data to display          MMRC:     No data to display          [facility]:      No data to display          Tests:   FENO:  No results found for: "NITRICOXIDE"  PFT:    Latest Ref Rng & Units 03/01/2019    8:56 AM 12/12/2018    4:50 PM  12/28/2017    3:47 PM  PFT Results  FVC-Pre L 1.55  1.23    FVC-Predicted Pre % 54  42  56   FVC-Post L   1.61   FVC-Predicted Post %   55   Pre FEV1/FVC % % 92  92  91   Post FEV1/FCV % %   91   FEV1-Pre L 1.43  1.12  1.49   FEV1-Predicted Pre % 66  52  68   FEV1-Post L   1.47   DLCO uncorrected ml/min/mmHg 18.22  11.35  15.97   DLCO UNC% % 95  59  67   DLCO corrected ml/min/mmHg  12.17    DLCO COR %Predicted %  64    DLVA Predicted % 155  152  121   TLC L   2.93   TLC % Predicted %   58   RV % Predicted %   52   02/2019 reviewed and interpreted as spirometry suggestive of moderate restriction, DLCO within normal limits 11/2018 reviewed and interpreted as chronically suggestive of moderate restriction, DLCO moderately reduced 12/2017 reviewed and interpreted as primary suggestive of my restriction versus air trapping, no bronchodilator response, lung volumes consistent with moderate restriction, DLCO moderately reduced WALK:     06/28/2019   11:15 AM 03/01/2019   10:31 AM 12/28/2018    3:27 PM 02/04/2018  9:39 AM  SIX MIN WALK  Supplimental Oxygen during Test? (L/min) No No No No  Tech Comments: slow pace walk, SOB after first lap, three stops to rest, sats WNL normal pace, knee pain, no dsat TA/CMA Pt did not want to continue due to leg pain. Pt did have some dypsnea. Pt ambulated at a slow pace with a steady gait.     Imaging: Personally reviewed No results found.  Lab Results: Personally reviewed CBC    Component Value Date/Time   WBC 10.9 (H) 03/24/2019 1237   WBC 9.5 12/12/2018 0321   RBC 4.13 03/24/2019 1237   RBC 3.83 (L) 12/12/2018 0321   HGB 12.2 03/24/2019 1237   HCT 36.5 03/24/2019 1237   PLT 381 03/24/2019 1237   MCV 88 03/24/2019 1237   MCH 29.5 03/24/2019 1237   MCH 29.8 12/12/2018 0321   MCHC 33.4 03/24/2019 1237   MCHC 31.7 12/12/2018 0321   RDW 14.0 03/24/2019 1237   LYMPHSABS 3.7 12/12/2018 0321   MONOABS 0.7 12/12/2018 0321   EOSABS 0.2  12/12/2018 0321   BASOSABS 0.1 12/12/2018 0321    BMET    Component Value Date/Time   NA 137 03/24/2019 1237   K 4.0 03/24/2019 1237   CL 96 03/24/2019 1237   CO2 27 03/24/2019 1237   GLUCOSE 238 (H) 03/24/2019 1237   GLUCOSE 197 (H) 12/13/2018 0411   BUN 17 03/24/2019 1237   CREATININE 0.88 03/24/2019 1237   CALCIUM 9.8 03/24/2019 1237   GFRNONAA 66 03/24/2019 1237   GFRAA 76 03/24/2019 1237    BNP    Component Value Date/Time   BNP 110.5 (H) 12/08/2018 1653    ProBNP    Component Value Date/Time   PROBNP 51.0 05/24/2010 0013    Specialty Problems       Pulmonary Problems   Sleep apnea-unable to tolerate C-pap   Amiodarone pulmonary toxicity   Restrictive lung disease   ILD (interstitial lung disease) (HCC)    Allergies  Allergen Reactions   Amiodarone     Other reaction(s): pulmonary fibrosis   Gabapentin     Other reaction(s): edema    Immunization History  Administered Date(s) Administered   Pneumococcal Conjugate-13 12/09/2016    Past Medical History:  Diagnosis Date   Asthmatic bronchitis    CHF (congestive heart failure) ([place]) dx'd 2016   Coronary atherosclerosis of native coronary artery    a. s/p CABG 2005 Dr Roxy Manns     Hyperlipidemia    Hypertension    Hypertensive heart disease    Kidney stones    Mixed hyperlipidemia    Nephrolithiasis 06/07/2015   Paroxysmal atrial fibrillation ([place])    a. s/p Maze in 2005 b. recurrence in March 2016 with >8sec posttermination pauses s/p PPM implant     Pneumonia 1950   "double"   Presence of permanent cardiac pacemaker    Restrictive lung disease 03/03/2018   Sleep apnea    "tested; mask ordered; couldn't afford it; will get it now" (09/04/2015)   TIA (transient ischemic attack)    Type II diabetes mellitus ([place])    Uterine cancer [facility])    age 33 with partial hysterectomy    Tobacco History: Social History   Tobacco Use  Smoking Status Never  Smokeless Tobacco Never   Counseling given:  Not Answered   Continue to not smoke  Outpatient Encounter Medications as of 07/01/2021  Medication Sig   acetaminophen (TYLENOL) 500 MG tablet Take 500-1,000 mg by mouth  every 6 (six) hours as needed for moderate pain.   apixaban (ELIQUIS) 5 MG TABS tablet TAKE ONE TABLET TWICE DAILY   B Complex-Biotin-FA (B-COMPLEX PO) Take 1 capsule by mouth every morning.    BIOTIN 5000 PO Take 1 tablet by mouth daily.   Cholecalciferol (VITAMIN D3) 1000 UNITS CAPS Take 1,000 Units by mouth daily.   Coenzyme Q10 (CO Q-10) 200 MG CAPS Take 200 mg by mouth daily.   diphenhydramine-acetaminophen (TYLENOL PM) 25-500 MG TABS tablet Take 1 tablet by mouth at bedtime as needed (pain/sleep).    HUMALOG MIX 75/25 KWIKPEN (75-25) 100 UNIT/ML Kwikpen Inject 34-38 Units into the skin in the morning, at noon, and at bedtime.   losartan (COZAAR) 50 MG tablet TAKE 1 TABLET UPTO TWICE A DAY AS DIRECTED   metFORMIN (GLUCOPHAGE) 1000 MG tablet Take 1,000 mg by mouth 2 (two) times daily with a meal.    metolazone (ZAROXOLYN) 2.5 MG tablet Take 2.5 mg by mouth daily as needed.   metoprolol succinate (TOPROL-XL) 50 MG 24 hr tablet Take 50 mg ( 1 tablet) in the morning  and 25 mg ( 1/2 tablet ) in the evening   potassium chloride (KLOR-CON) 10 MEQ tablet Take 20 mEq by mouth daily.   [DISCONTINUED] fenofibrate 160 MG tablet TAKE 1 TABLET DAILY   [DISCONTINUED] furosemide (LASIX) 40 MG tablet TAKE (1) TABLET TWICE A DAY.   No facility-administered encounter medications on file as of 07/01/2021.     Review of Systems  Review of Systems  No chest pain with exertion.  No orthopnea or PND.  Comprehensive review of systems otherwise negative. Physical Exam  BP 122/66 (BP Location: Left Arm, Patient Position: Sitting, Cuff Size: Normal)   Pulse 98   Temp 98.5 F (36.9 C) (Oral)   Ht _0  (1.626 m)   Wt 227 lb 3.2 oz (103.1 kg)   SpO2 95%   BMI 39.00 kg/m   Wt Readings from Last 5 Encounters:  07/01/21 227 lb 3.2 oz  (103.1 kg)  06/04/21 230 lb (104.3 kg)  05/23/21 225 lb (102.1 kg)  02/25/21 230 lb (104.3 kg)  05/03/20 227 lb 9.6 oz (103.2 kg)    BMI Readings from Last 5 Encounters:  07/01/21 39.00 kg/m  06/04/21 39.48 kg/m  05/23/21 38.62 kg/m  02/25/21 39.48 kg/m  05/03/20 39.07 kg/m     Physical Exam General: Sitting in chair, no acute distress Eyes: EOMI, icterus Neck: Supple, no JVP Pulmonary: Faint crackles upper lung fields, otherwise clear, normal work of breathing Cardiovascular: Regular rate and rhythm, no murmur Bowel sounds present MSK: No synovitis, no joint effusion Neuro: Normal gait, no weakness Psych: Normal mood, full affect   Assessment & Plan:   Presumed pulmonary hypertension: Suspect this is multifactorial.  Longstanding evidence of left-sided structural disease including mitral valve stenosis dating back to 2018, dilated left atrium and 2020, and ongoing dilated left atrium more recently 2023.  There is been RV dysfunction since at least 11/2018 including right atrial pressure elevation at that time.  Other contributors include group 2 disease given her interstitial lung disease on CT scan.  High concern for undiagnosed hypoxemia given significant interstitial changes.  Hypoxemia and ILD would be likely contributors and group 3.  Untreated sleep apnea likely contributing.  Encouraged to resume CPAP therapy.  No known history of VTE.  Low suspicion for Group 1 disease.  Overall, not good candidate for pulmonary vasodilators given left-sided disease as well as her inability to  tolerate or use CPAP.  PFTs ordered.  Can reconsider in the future, will require right heart catheterization if they decide to pursue pulmonary vasodilators.  Hypoxemic respiratory failure: Desaturated today when ambulating around.  O2 sat improved with portable oxygen concentrator.  Order for portable oxygen concentrator sent.   Return in about 4 weeks (around 07/29/2021).   Lanier Clam, MD 08/25/2021   This appointment required 62 minutes of patient care (this includes precharting, chart review, review of results, face-to-face care, etc.).

## 2021-08-28 DIAGNOSIS — E785 Hyperlipidemia, unspecified: Secondary | ICD-10-CM | POA: Diagnosis not present

## 2021-08-28 DIAGNOSIS — E1169 Type 2 diabetes mellitus with other specified complication: Secondary | ICD-10-CM | POA: Diagnosis not present

## 2021-08-28 DIAGNOSIS — N1832 Chronic kidney disease, stage 3b: Secondary | ICD-10-CM | POA: Diagnosis not present

## 2021-08-28 DIAGNOSIS — I5020 Unspecified systolic (congestive) heart failure: Secondary | ICD-10-CM | POA: Diagnosis not present

## 2021-08-28 DIAGNOSIS — I10 Essential (primary) hypertension: Secondary | ICD-10-CM | POA: Diagnosis not present

## 2021-08-28 DIAGNOSIS — Z794 Long term (current) use of insulin: Secondary | ICD-10-CM | POA: Diagnosis not present

## 2021-08-29 ENCOUNTER — Ambulatory Visit (INDEPENDENT_AMBULATORY_CARE_PROVIDER_SITE_OTHER): Payer: PPO | Admitting: Pulmonary Disease

## 2021-08-29 ENCOUNTER — Ambulatory Visit: Payer: PPO | Admitting: Cardiovascular Disease

## 2021-08-29 ENCOUNTER — Ambulatory Visit: Payer: PPO | Admitting: Pulmonary Disease

## 2021-08-29 ENCOUNTER — Encounter: Payer: Self-pay | Admitting: Pulmonary Disease

## 2021-08-29 VITALS — BP 120/66 | HR 94 | Temp 98.5°F | Ht 63.0 in | Wt 226.8 lb

## 2021-08-29 DIAGNOSIS — J849 Interstitial pulmonary disease, unspecified: Secondary | ICD-10-CM | POA: Diagnosis not present

## 2021-08-29 LAB — PULMONARY FUNCTION TEST
DL/VA % pred: 122 %
DL/VA: 5.03 ml/min/mmHg/L
DLCO cor % pred: 57 %
DLCO cor: 10.88 ml/min/mmHg
DLCO unc % pred: 57 %
DLCO unc: 10.88 ml/min/mmHg
FEF 25-75 Post: 2.79 L/sec
FEF 25-75 Pre: 2.67 L/sec
FEF2575-%Change-Post: 4 %
FEF2575-%Pred-Post: 171 %
FEF2575-%Pred-Pre: 163 %
FEV1-%Change-Post: 0 %
FEV1-%Pred-Post: 56 %
FEV1-%Pred-Pre: 56 %
FEV1-Post: 1.17 L
FEV1-Pre: 1.16 L
FEV1FVC-%Change-Post: 0 %
FEV1FVC-%Pred-Pre: 124 %
FEV6-%Change-Post: 0 %
FEV6-%Pred-Post: 47 %
FEV6-%Pred-Pre: 47 %
FEV6-Post: 1.25 L
FEV6-Pre: 1.25 L
FEV6FVC-%Pred-Post: 105 %
FEV6FVC-%Pred-Pre: 105 %
FVC-%Change-Post: 0 %
FVC-%Pred-Post: 45 %
FVC-%Pred-Pre: 45 %
FVC-Post: 1.25 L
FVC-Pre: 1.25 L
Post FEV1/FVC ratio: 93 %
Post FEV6/FVC ratio: 100 %
Pre FEV1/FVC ratio: 93 %
Pre FEV6/FVC Ratio: 100 %
RV % pred: 54 %
RV: 1.24 L
TLC % pred: 54 %
TLC: 2.71 L

## 2021-08-29 NOTE — Patient Instructions (Signed)
We will get a repeat CT scan in September to keep an eye on the scar on the lung  No changes to medications  We will see you back in 3 months after CT scan

## 2021-08-29 NOTE — Progress Notes (Signed)
_0  ID: [name], female    DOB: [birth year], 76 y.o.   MRN: [id number]  Chief Complaint  Patient presents with   Follow-up    Pt is here for ILD follow up. Pt is on 2L on her POC. Pt states no changes since last visit.     Referring provider: Velna Hatchet, MD  HPI:   76 y.o. woman whom we are seeing for evaluation of presumed pulmonary hypertension.    At last visit was ambulated.  Noted to be hypoxemic.  Placed on 2 L with exertion.  Using POC when out.  Concentrator at home.  Does feel it gives her more energy, more breath.  Can be more active with the oxygen.  Does not love carrying around.  Pulmonary function tests today.  Consistent with moderate restriction with moderately reduced DLCO.  HPI at initial visit: Patiently recently reestablished with pulmonary clinic.  Increased cough.  Some dyspnea on exertion as well.  In the past and followed with Dr. Chase Caller.  CT scan with interstitial changes, concern for hypersensitivity pneumonitis.  Did not follow-up.  Seen recently.  CT high-res shows worsening interlobular septal thickening, mosaicism, scattered nodular opacities concerning for hypersensitivity pneumonitis, sarcoidosis considered.  PA appeared enlarged on that CT scan.  This prompted TTE which demonstrated RV dysfunction, RA dilation.  Review of TTE dating back to 2020 shows RV dysfunction.  Not present in 2018.  She has dyspnea.  With relatively robust exertion.  Normal walking outside bed.  Worse with inclines or stairs.  Worsening a bit over the last several months to year or 2.  PMH: Atrial fibrillation, hypertension, diastolic congestive heart failure, CAD Surgical history: CABG, cholecystectomy Family history:Mother with hypertension, CAD, hyperlipidemia, father with diabetes, CAD, CVA Social history: Never smoker, lives in [place] / Pulmonary Flowsheets:   ACT:      No data to display           MMRC:      No data to display           Epworth:      No data to display           Tests:   FENO:  No results found for: "NITRICOXIDE"  PFT:    Latest Ref Rng & Units 08/29/2021    9:55 AM 03/01/2019    8:56 AM 12/12/2018    4:50 PM 12/28/2017    3:47 PM  PFT Results  FVC-Pre L 1.25  P 1.55  1.23    FVC-Predicted Pre % 45  P 54  42  56   FVC-Post L 1.25  P   1.61   FVC-Predicted Post % 45  P   55   Pre FEV1/FVC % % 93  P 92  92  91   Post FEV1/FCV % % 93  P   91   FEV1-Pre L 1.16  P 1.43  1.12  1.49   FEV1-Predicted Pre % 56  P 66  52  68   FEV1-Post L 1.17  P   1.47   DLCO uncorrected ml/min/mmHg 10.88  P 18.22  11.35  15.97   DLCO UNC% % 57  P 95  59  67   DLCO corrected ml/min/mmHg 10.88  P  12.17    DLCO COR %Predicted % 57  P  64    DLVA Predicted % 122  P 155  152  121   TLC L 2.71  P   2.93   TLC % Predicted % 54  P   58   RV % Predicted % 54  P   52     P Preliminary result   02/2019 reviewed and interpreted as spirometry suggestive of moderate restriction, DLCO within normal limits 11/2018 reviewed and interpreted as chronically suggestive of moderate restriction, DLCO moderately reduced 12/2017 reviewed and interpreted as primary suggestive of my restriction versus air trapping, no bronchodilator response, lung volumes consistent with moderate restriction, DLCO moderately reduced 08/2021 reviewed interpreted spirometry suggestive of severe restriction versus air trapping, no bronchodilator response, lung volumes consistent with moderate restriction with moderately reduced DLCO WALK:     06/28/2019   11:15 AM 03/01/2019   10:31 AM 12/28/2018    3:27 PM 02/04/2018    9:39 AM  SIX MIN WALK  Supplimental Oxygen during Test? (L/min) No No No No  Tech Comments: slow pace walk, SOB after first lap, three stops to rest, sats WNL normal pace, knee pain, no dsat TA/CMA Pt did not want to continue due to leg pain. Pt did have some dypsnea. Pt ambulated at a slow pace  with a steady gait.     Imaging: Personally reviewed No results found.  Lab Results: Personally reviewed CBC    Component Value Date/Time   WBC 10.9 (H) 03/24/2019 1237   WBC 9.5 12/12/2018 0321   RBC 4.13 03/24/2019 1237   RBC 3.83 (L) 12/12/2018 0321   HGB 12.2 03/24/2019 1237   HCT 36.5 03/24/2019 1237   PLT 381 03/24/2019 1237   MCV 88 03/24/2019 1237   MCH 29.5 03/24/2019 1237   MCH 29.8 12/12/2018 0321   MCHC 33.4 03/24/2019 1237   MCHC 31.7 12/12/2018 0321   RDW 14.0 03/24/2019 1237   LYMPHSABS 3.7 12/12/2018 0321   MONOABS 0.7 12/12/2018 0321   EOSABS 0.2 12/12/2018 0321   BASOSABS 0.1 12/12/2018 0321    BMET    Component Value Date/Time   NA 137 03/24/2019 1237   K 4.0 03/24/2019 1237   CL 96 03/24/2019 1237   CO2 27 03/24/2019 1237   GLUCOSE 238 (H) 03/24/2019 1237   GLUCOSE 197 (H) 12/13/2018 0411   BUN 17 03/24/2019 1237   CREATININE 0.88 03/24/2019 1237   CALCIUM 9.8 03/24/2019 1237   GFRNONAA 66 03/24/2019 1237   GFRAA 76 03/24/2019 1237    BNP    Component Value Date/Time   BNP 110.5 (H) 12/08/2018 1653    ProBNP    Component Value Date/Time   PROBNP 51.0 05/24/2010 0013    Specialty Problems       Pulmonary Problems   Sleep apnea-unable to tolerate C-pap   Amiodarone pulmonary toxicity   Restrictive lung disease   ILD (interstitial lung disease) (HCC)    Allergies  Allergen Reactions   Amiodarone     Other reaction(s): pulmonary fibrosis   Gabapentin     Other reaction(s): edema    Immunization History  Administered Date(s) Administered   Pneumococcal Conjugate-13 12/09/2016    Past Medical History:  Diagnosis Date   Asthmatic bronchitis    CHF (congestive heart failure) ([place]) dx'd 2016   Coronary atherosclerosis of native coronary artery    a. s/p CABG 2005 Dr Roxy Manns     Hyperlipidemia    Hypertension    Hypertensive heart disease    Kidney stones    Mixed hyperlipidemia    Nephrolithiasis 06/07/2015    Paroxysmal atrial fibrillation ([place])    a.  s/p Maze in 2005 b. recurrence in March 2016 with >8sec posttermination pauses s/p PPM implant     Pneumonia 1950   "double"   Presence of permanent cardiac pacemaker    Restrictive lung disease 03/03/2018   Sleep apnea    "tested; mask ordered; couldn't afford it; will get it now" (09/04/2015)   TIA (transient ischemic attack)    Type II diabetes mellitus ([place])    Uterine cancer [facility])    age 61 with partial hysterectomy    Tobacco History: Social History   Tobacco Use  Smoking Status Never  Smokeless Tobacco Never   Counseling given: Not Answered   Continue to not smoke  Outpatient Encounter Medications as of 08/29/2021  Medication Sig   acetaminophen (TYLENOL) 500 MG tablet Take 500-1,000 mg by mouth every 6 (six) hours as needed for moderate pain.   apixaban (ELIQUIS) 5 MG TABS tablet TAKE ONE TABLET TWICE DAILY   B Complex-Biotin-FA (B-COMPLEX PO) Take 1 capsule by mouth every morning.    BIOTIN 5000 PO Take 1 tablet by mouth daily.   Cholecalciferol (VITAMIN D3) 1000 UNITS CAPS Take 1,000 Units by mouth daily.   Coenzyme Q10 (CO Q-10) 200 MG CAPS Take 200 mg by mouth daily.   diphenhydramine-acetaminophen (TYLENOL PM) 25-500 MG TABS tablet Take 1 tablet by mouth at bedtime as needed (pain/sleep).    FARXIGA 10 MG TABS tablet Take 10 mg by mouth daily.   fenofibrate 160 MG tablet TAKE 1 TABLET DAILY   furosemide (LASIX) 40 MG tablet TAKE (1) TABLET TWICE A DAY.   HUMALOG MIX 75/25 KWIKPEN (75-25) 100 UNIT/ML Kwikpen Inject 34-38 Units into the skin in the morning, at noon, and at bedtime.   losartan (COZAAR) 50 MG tablet TAKE 1 TABLET UPTO TWICE A DAY AS DIRECTED   metolazone (ZAROXOLYN) 2.5 MG tablet Take 2.5 mg by mouth daily as needed.   metoprolol succinate (TOPROL-XL) 50 MG 24 hr tablet Take 50 mg ( 1 tablet) in the morning  and 25 mg ( 1/2 tablet ) in the evening   potassium chloride (KLOR-CON) 10 MEQ tablet Take 20 mEq by  mouth daily.   [DISCONTINUED] metFORMIN (GLUCOPHAGE) 1000 MG tablet Take 1,000 mg by mouth 2 (two) times daily with a meal.    No facility-administered encounter medications on file as of 08/29/2021.     Review of Systems  Review of Systems  N/a Physical Exam  BP 120/66 (BP Location: Right Arm, Patient Position: Sitting, Cuff Size: Normal)   Pulse 94   Temp 98.5 F (36.9 C) (Oral)   Ht _0  (1.6 m)   Wt 226 lb 12.8 oz (102.9 kg)   SpO2 98%   BMI 40.18 kg/m   Wt Readings from Last 5 Encounters:  08/29/21 226 lb 12.8 oz (102.9 kg)  07/01/21 227 lb 3.2 oz (103.1 kg)  06/04/21 230 lb (104.3 kg)  05/23/21 225 lb (102.1 kg)  02/25/21 230 lb (104.3 kg)    BMI Readings from Last 5 Encounters:  08/29/21 40.18 kg/m  07/01/21 39.00 kg/m  06/04/21 39.48 kg/m  05/23/21 38.62 kg/m  02/25/21 39.48 kg/m     Physical Exam General: Sitting in chair, no acute distress Eyes: EOMI, icterus Neck: Supple, no JVP Pulmonary: Faint crackles upper lung fields, otherwise clear, normal work of breathing Cardiovascular: Regular rate and rhythm, no murmur Bowel sounds present MSK: No synovitis, no joint effusion Neuro: Normal gait, no weakness Psych: Normal mood, full affect   Assessment &  Plan:   Presumed pulmonary hypertension: Suspect this is multifactorial.  Longstanding evidence of left-sided structural disease including mitral valve stenosis dating back to 2018, dilated left atrium and 2020, and ongoing dilated left atrium more recently 2023.  There is been RV dysfunction since at least 11/2018 including right atrial pressure elevation at that time.  Other contributors include group 2 disease given her interstitial lung disease on CT scan.  High concern for undiagnosed hypoxemia given significant interstitial changes.  Hypoxemia and ILD would be likely contributors and group 3.  Untreated sleep apnea likely contributing.  Encouraged to resume CPAP therapy.  No known history of VTE.   Low suspicion for Group 1 disease.  Overall, not good candidate for pulmonary vasodilators given left-sided disease as well as her inability to tolerate or use CPAP.   Can reconsider in the future, will require right heart catheterization if they decide to pursue pulmonary vasodilators.  Hypoxemic respiratory failure: On 2 L nasal cannula with improved perception of dyspnea since using oxygen.  Interstitial lung disease: On CT scan, worse 2023 compared to 2020.  HSP versus NSIP in my opinion.  Consider antifibrotic's versus immunosuppressants in the future.  Repeat CT scan in 3 months for further evaluation.  PFTs largely unchanged in 2-1/2-year interval.   Return in about 3 months (around 11/29/2021).   Lanier Clam, MD 08/29/2021   This appointment required 45 minutes of patient care (this includes precharting, chart review, review of results, face-to-face care, etc.).

## 2021-08-29 NOTE — Progress Notes (Signed)
PFT done today. 

## 2021-08-31 ENCOUNTER — Other Ambulatory Visit: Payer: Self-pay | Admitting: Cardiovascular Disease

## 2021-09-02 NOTE — Telephone Encounter (Signed)
Prescription refill request for Eliquis received. Indication:Afib Last office visit:3/23 Scr:1.2 Age: 76 Weight:102.9 kg  Prescription refilled

## 2021-09-12 ENCOUNTER — Other Ambulatory Visit: Payer: Self-pay | Admitting: Cardiovascular Disease

## 2021-09-18 ENCOUNTER — Ambulatory Visit (INDEPENDENT_AMBULATORY_CARE_PROVIDER_SITE_OTHER): Payer: PPO

## 2021-09-18 DIAGNOSIS — I442 Atrioventricular block, complete: Secondary | ICD-10-CM

## 2021-09-19 DIAGNOSIS — J849 Interstitial pulmonary disease, unspecified: Secondary | ICD-10-CM | POA: Diagnosis not present

## 2021-09-20 LAB — CUP PACEART REMOTE DEVICE CHECK
Battery Remaining Longevity: 17 mo
Battery Voltage: 2.92 V
Brady Statistic RA Percent Paced: 0 %
Brady Statistic RV Percent Paced: 97.78 %
Date Time Interrogation Session: 20230706114300
Implantable Lead Implant Date: 20160321
Implantable Lead Implant Date: 20160321
Implantable Lead Location: 753859
Implantable Lead Location: 753860
Implantable Lead Model: 5076
Implantable Lead Model: 5076
Implantable Pulse Generator Implant Date: 20160321
Lead Channel Impedance Value: 380 Ohm
Lead Channel Impedance Value: 494 Ohm
Lead Channel Impedance Value: 513 Ohm
Lead Channel Impedance Value: 551 Ohm
Lead Channel Pacing Threshold Amplitude: 0.5 V
Lead Channel Pacing Threshold Amplitude: 0.875 V
Lead Channel Pacing Threshold Pulse Width: 0.4 ms
Lead Channel Pacing Threshold Pulse Width: 0.4 ms
Lead Channel Sensing Intrinsic Amplitude: 0.375 mV
Lead Channel Sensing Intrinsic Amplitude: 0.375 mV
Lead Channel Sensing Intrinsic Amplitude: 13.625 mV
Lead Channel Sensing Intrinsic Amplitude: 13.625 mV
Lead Channel Setting Pacing Amplitude: 2.5 V
Lead Channel Setting Pacing Pulse Width: 0.4 ms
Lead Channel Setting Sensing Sensitivity: 4 mV

## 2021-09-21 DIAGNOSIS — J849 Interstitial pulmonary disease, unspecified: Secondary | ICD-10-CM | POA: Diagnosis not present

## 2021-10-01 NOTE — Progress Notes (Unsigned)
Cardiology Office Note:    Date:  10/02/2021   ID:  [name], DOB 21-Aug-1945, MRN [id number]  PCP:  Velna Hatchet, [place] Cardiologist: Shelva Majestic, MD   Reason for visit: 6 mo follow-up  History of Present Illness:    [name] is a 76 y.o. female with a hx of HTN, HLD, TIA, DM, Afib, CAD (CABG and MAZE 2005), post termination pauses >> PPM, ILD, OSA, mitral stenosis.  Hx of PPM, AV node ablation in January 2021 for uncontrolled atrial fibrillation.  She last saw Dr. Claiborne Billings in December 2022.  She admitted to increased fatigue and occasional leg swelling.  Her metoprolol was decreased from 100 mg to metoprolol 50 mg in the morning and 25 mg at night.  She saw Renee with EP in March 2023.  Patient had no palpitations or dizzy spells.  She has chronic shortness of breath (ILD)followed by pulmonary.  Today, patient states she is okay from a heart standpoint.  She is currently having back pain and is going to see Ortho for possible back injections.  Pulmonary has started her on oxygen she wears 2 L/min nasal cannula as needed particularly with exertion.  PFTs stable over the last 2 years.  Pulmonary has planned repeat CT of the chest.    For diastolic heart failure, she takes Lasix 40 mg twice a day.  She takes metolazone 2.5 mg as needed.  She states she is using metolazone every 2 to 3 days and takes it 2 hours after her first Lasix dose.  She takes metolazone for feet swelling, worsening shortness of breath or weight gain.  She states her dry weight is 225 pounds.  Her weight by her scale this morning was 230 pounds.  She sleeps with her head slightly elevated.  Denies PND.  She states she started [place] approximately 1 month ago for her diabetes and heart failure.  She has not had blood rechecked since then.  Relating to your history of CAD, she states she felt back, shoulder and jaw pain prior to her CABG 2005.  No recurrent angina.   Past Medical History:   Diagnosis Date   Asthmatic bronchitis    CHF (congestive heart failure) ([place]) dx'd 2016   Coronary atherosclerosis of native coronary artery    a. s/p CABG 2005 Dr Roxy Manns     Hyperlipidemia    Hypertension    Hypertensive heart disease    Kidney stones    Mixed hyperlipidemia    Nephrolithiasis 06/07/2015   Paroxysmal atrial fibrillation ([place])    a. s/p Maze in 2005 b. recurrence in March 2016 with >8sec posttermination pauses s/p PPM implant     Pneumonia 1950   "double"   Presence of permanent cardiac pacemaker    Restrictive lung disease 03/03/2018   Sleep apnea    "tested; mask ordered; couldn't afford it; will get it now" (09/04/2015)   TIA (transient ischemic attack)    Type II diabetes mellitus ([place])    Uterine cancer [facility])    age 40 with partial hysterectomy    Past Surgical History:  Procedure Laterality Date   AV NODE ABLATION N/A 03/28/2019   Procedure: AV NODE ABLATION;  Surgeon: Evans Lance, MD;  Location: [place] [facility];  Service: Cardiovascular;  Laterality: N/A;   CARDIAC CATHETERIZATION  2005   CORONARY ARTERY BYPASS GRAFT  2005   Dr Roxy Manns with MAZE   CYSTOSCOPY WITH URETEROSCOPY, STONE BASKETRY AND STENT PLACEMENT Left  09/08/2014   Procedure: CYSTOSCOPY WITH URETEROSCOPY, STONE BASKETRY AND STENT PLACEMENT;  Surgeon: Cleon Gustin, MD;  Location: [facility];  Service: Urology;  Laterality: Left;   CYSTOSCOPY/URETEROSCOPY/HOLMIUM LASER/STENT PLACEMENT Left 09/15/2014   Procedure: CYSTOSCOPY/RETROGRADE/URETEROSCOPY/HOLMIUM LASER/ STONE EXTRACTION /STENT EXCHANGE;  Surgeon: Cleon Gustin, MD;  Location: [facility];  Service: Urology;  Laterality: Left;   HOLMIUM LASER APPLICATION Left 10/09/3662   Procedure: HOLMIUM LASER APPLICATION;  Surgeon: Cleon Gustin, MD;  Location: [facility];  Service: Urology;  Laterality: Left;   HOLMIUM LASER APPLICATION Left 4/0/3474   Procedure: HOLMIUM LASER APPLICATION;  Surgeon: Cleon Gustin, MD;  Location: [facility];   Service: Urology;  Laterality: Left;   INSERT / REPLACE / REMOVE PACEMAKER     LAPAROSCOPIC CHOLECYSTECTOMY     PERMANENT PACEMAKER INSERTION N/A 06/05/2014   MDT Advisa 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   B Complex-Biotin-FA (B-COMPLEX PO) Take 1 capsule by mouth every morning.    BIOTIN 5000 PO Take 1 tablet by mouth daily.   Cholecalciferol (VITAMIN D3) 1000 UNITS CAPS Take 1,000 Units by mouth daily.   Coenzyme Q10 (CO Q-10) 200 MG CAPS Take 200 mg by mouth daily.   diphenhydramine-acetaminophen (TYLENOL PM) 25-500 MG TABS tablet Take 1 tablet by mouth at bedtime as needed (pain/sleep).    ELIQUIS 5 MG TABS tablet TAKE ONE TABLET TWICE DAILY   FARXIGA 10 MG TABS tablet Take 10 mg by mouth daily.   fenofibrate 160 MG tablet TAKE 1 TABLET DAILY   furosemide (LASIX) 40 MG tablet TAKE (1) TABLET TWICE A DAY.   HUMALOG MIX 75/25 KWIKPEN (75-25) 100 UNIT/ML Kwikpen Inject 34-38 Units into the skin in the morning, at noon, and at bedtime.   losartan (COZAAR) 50 MG tablet Take 50 mg by mouth daily. Take 1 Tablet Daily   metolazone (ZAROXOLYN) 2.5 MG tablet Take 2.5 mg by mouth daily as needed. Take 30 minutes prior to taking Lasix   metoprolol succinate (TOPROL-XL) 50 MG 24 hr tablet Take 50 mg by mouth daily. Take 1 Tablet Daily with or immediately following a meal.   potassium chloride (KLOR-CON) 10 MEQ tablet Take 20 mEq by mouth daily.   [DISCONTINUED] losartan (COZAAR) 50 MG tablet TAKE 1 TABLET UPTO TWICE A DAY AS DIRECTED   [DISCONTINUED] metoprolol succinate (TOPROL-XL) 50 MG 24 hr tablet Take 50 mg ( 1 tablet) in the morning  and 25 mg ( 1/2 tablet ) in the evening     Allergies:   Amiodarone and Gabapentin   Social History   Socioeconomic History    Marital status: Married    Spouse name: Not on file   Number of children: Not on file   Years of education: Not on file   Highest education level: Not on file  Occupational History   Not on file  Tobacco Use   Smoking status: Never   Smokeless tobacco: Never  Vaping Use   Vaping Use: Never used  Substance and Sexual Activity   Alcohol use: No   Drug use: No   Sexual activity: Not on file  Other Topics Concern   Not on file  Social History Narrative   Not on file   Social Determinants of Health  Financial Resource Strain: Low Risk  (12/09/2018)   Overall Financial Resource Strain (CARDIA)    Difficulty of Paying Living Expenses: Not hard at all  Food Insecurity: Unknown (12/09/2018)   Hunger Vital Sign    Worried About Running Out of Food in the Last Year: Patient refused    Ran Out of Food in the Last Year: Patient refused  Transportation Needs: Unknown (12/09/2018)   PRAPARE - Hydrologist (Medical): Patient refused    Lack of Transportation (Non-Medical): Patient refused  Physical Activity: Not on file  Stress: Not on file  Social Connections: Not on file     Family History: The patient's family history includes Arthritis in her brother; Diabetes in her father; Heart attack in her maternal grandfather; Heart disease in her father and mother; Hyperlipidemia in her mother; Hypertension in her daughter, mother, and sister; Stroke in her father and paternal grandfather.  ROS:   Please see the history of present illness.     EKGs/Labs/Other Studies Reviewed:    EKG:  The ekg ordered today demonstrates V paced at a rate 72.  Recent Labs: No results found for requested labs within last 365 days.   Recent Lipid Panel Lab Results  Component Value Date/Time   CHOL 188 03/19/2018 09:31 AM   TRIG 228 (H) 03/19/2018 09:31 AM   HDL 48 03/19/2018 09:31 AM   LDLCALC 94 03/19/2018 09:31 AM    Physical Exam:    VS:  BP 128/68   Pulse 72    Ht _0  (1.626 m)   Wt 232 lb 9.6 oz (105.5 kg)   SpO2 99%   BMI 39.93 kg/m    No data found.  Wt Readings from Last 3 Encounters:  10/02/21 232 lb 9.6 oz (105.5 kg)  08/29/21 226 lb 12.8 oz (102.9 kg)  07/01/21 227 lb 3.2 oz (103.1 kg)     GEN:  Well nourished, well developed in no acute distress HEENT: Normal NECK: No JVD; No carotid bruits CARDIAC: RRR, no murmurs, rubs, gallops RESPIRATORY: Dry crackles at the bases. ABDOMEN: Soft, non-tender, non-distended MUSCULOSKELETAL: Trace edema bilaterally; No deformity  SKIN: Warm and dry NEUROLOGIC:  Alert and oriented PSYCHIATRIC:  Normal affect    ASSESSMENT AND PLAN   Chronic diastolic heart failure, near euvolemic -2D echo March 2023 with EF 60 to 65%, mildly enlarged RV, moderately elevated PA pressures, mild to moderate mitral stenosis -Continue Lasix 40 mg twice daily. -Instructed correct use of metolazone -take 30 minutes prior to Lasix dose. -Continue Farxiga. -Watch salt intake. -Continue following daily weights with a dry weight estimated 225 pounds on home scale. -Check BMET today.  CAD, no angina -Hx of CABG 2005 -No aspirin given need for anticoagulation.  Permanent atrial fibrillation -MAZE 2005 -AV node ablation Jan 2021 -Amiodarone stopped Oct 2019 2/2 concerns of pulmonary tox -Followed by EP; follows PPM interrogations. -Currently takes Toprol 50 mg in the morning and 25 mg at night.  -- Patient complains of having to have halve Toprol tablet at night.  Okay to decrease Toprol to only 50 mg 1 tablet in the morning.  Hypertension, well controlled -Continue losartan 50 mg once daily.  Check BMET today. -Goal BP is <130/80.  Recommend DASH diet (high in vegetables, fruits, low-fat dairy products, whole grains, poultry, fish, and nuts and low in sweets, sugar-sweetened beverages, and red meats), salt restriction and increase physical activity.  Disposition - Follow-up in 6 months.   Medication  Adjustments/Labs  and Tests Ordered: Current medicines are reviewed at length with the patient today.  Concerns regarding medicines are outlined above.  Orders Placed This Encounter  Procedures   Basic metabolic panel   EKG [id number]   No orders of the defined types were placed in this encounter.   Patient Instructions  Medication Instructions:  Decrease Metoprolol Succinate from 75 mg to 50 mg ( Take 1 Tablet Daily).  *If you need a refill on your cardiac medications before your next appointment, please call your pharmacy*   Lab Work: BMET Today If you have labs (blood work) drawn today and your tests are completely normal, you will receive your results only by: [place] (if you have MyChart) OR A paper copy in the mail If you have any lab test that is abnormal or we need to change your treatment, we will call you to review the results.   Testing/Procedures: No Testing   Follow-Up: At [facility], you and your health needs are our priority.  As part of our continuing mission to provide you with exceptional heart care, we have created designated Provider Care Teams.  These Care Teams include your primary Cardiologist (physician) and Advanced Practice Providers (APPs -  Physician Assistants and Nurse Practitioners) who all work together to provide you with the care you need, when you need it.  We recommend signing up for the patient portal called "MyChart".  Sign up information is provided on this After Visit Summary.  MyChart is used to connect with patients for Virtual Visits (Telemedicine).  Patients are able to view lab/test results, encounter notes, upcoming appointments, etc.  Non-urgent messages can be sent to your provider as well.   To learn more about what you can do with MyChart, go to [email or web address].    Your next appointment:   6 months  The format for your next appointment:   In Person  Provider:   Caron Presume, PA-C   or, Shelva Majestic, MD .       Important Information About Sugar         Signed, Dana Lacy, PA-C  10/02/2021 12:47 PM    [place]

## 2021-10-02 ENCOUNTER — Encounter: Payer: Self-pay | Admitting: Physician Assistant

## 2021-10-02 ENCOUNTER — Ambulatory Visit: Payer: PPO | Admitting: Physician Assistant

## 2021-10-02 VITALS — BP 128/68 | HR 72 | Ht 64.0 in | Wt 232.6 lb

## 2021-10-02 DIAGNOSIS — I2510 Atherosclerotic heart disease of native coronary artery without angina pectoris: Secondary | ICD-10-CM

## 2021-10-02 DIAGNOSIS — E782 Mixed hyperlipidemia: Secondary | ICD-10-CM | POA: Diagnosis not present

## 2021-10-02 DIAGNOSIS — I4821 Permanent atrial fibrillation: Secondary | ICD-10-CM | POA: Diagnosis not present

## 2021-10-02 DIAGNOSIS — I10 Essential (primary) hypertension: Secondary | ICD-10-CM | POA: Diagnosis not present

## 2021-10-02 LAB — BASIC METABOLIC PANEL
BUN/Creatinine Ratio: 31 — ABNORMAL HIGH (ref 12–28)
BUN: 51 mg/dL — ABNORMAL HIGH (ref 8–27)
CO2: 31 mmol/L — ABNORMAL HIGH (ref 20–29)
Calcium: 10.1 mg/dL (ref 8.7–10.3)
Chloride: 93 mmol/L — ABNORMAL LOW (ref 96–106)
Creatinine, Ser: 1.67 mg/dL — ABNORMAL HIGH (ref 0.57–1.00)
Glucose: 160 mg/dL — ABNORMAL HIGH (ref 70–99)
Potassium: 4.4 mmol/L (ref 3.5–5.2)
Sodium: 138 mmol/L (ref 134–144)
eGFR: 32 mL/min/{1.73_m2} — ABNORMAL LOW (ref >59)

## 2021-10-02 NOTE — Patient Instructions (Addendum)
Medication Instructions:  Decrease Metoprolol Succinate from 75 mg to 50 mg ( Take 1 Tablet Daily).  *If you need a refill on your cardiac medications before your next appointment, please call your pharmacy*   Lab Work: BMET Today If you have labs (blood work) drawn today and your tests are completely normal, you will receive your results only by: [place] (if you have MyChart) OR A paper copy in the mail If you have any lab test that is abnormal or we need to change your treatment, we will call you to review the results.   Testing/Procedures: No Testing   Follow-Up: At [facility], you and your health needs are our priority.  As part of our continuing mission to provide you with exceptional heart care, we have created designated Provider Care Teams.  These Care Teams include your primary Cardiologist (physician) and Advanced Practice Providers (APPs -  Physician Assistants and Nurse Practitioners) who all work together to provide you with the care you need, when you need it.  We recommend signing up for the patient portal called "MyChart".  Sign up information is provided on this After Visit Summary.  MyChart is used to connect with patients for Virtual Visits (Telemedicine).  Patients are able to view lab/test results, encounter notes, upcoming appointments, etc.  Non-urgent messages can be sent to your provider as well.   To learn more about what you can do with MyChart, go to [email or web address].    Your next appointment:   6 months  The format for your next appointment:   In Person  Provider:   Caron Presume, PA-C   or, Shelva Majestic, MD .      Important Information About Sugar

## 2021-10-04 ENCOUNTER — Telehealth: Payer: Self-pay

## 2021-10-04 DIAGNOSIS — I10 Essential (primary) hypertension: Secondary | ICD-10-CM

## 2021-10-04 NOTE — Telephone Encounter (Addendum)
Called patient regarding results. Patient had understanding of results. Patient advised will need repeat Blood work in 2 weeks and follow up visit in 2 months.----- Message from Warren Lacy, PA-C sent at 10/03/2021  1:03 PM EDT ----- Metabolic panel shows acute kidney injury.  Creatinine 0.88 in January 2021 now 1.67.  I am not sure how much the new start of Wilder Glade has contributed to this. You are likely dehydrated from diuretic use.  Recommendations -- Try to minimize metolazone use.  When you do have leg swelling/weight gain of 5 pounds, remember to take metolazone 30 minutes before your Lasix dose for it to be most effective. -- With a GFR of 32, I recommend stopping Farxiga until kidney functions proven stable. -- Continue Lasix 40 mg twice a day. -- Recheck kidney function (BMET) in 2 to 3 weeks. -- Recommend follow-up with me in 2 months. Colletta [place]-- please help arrange above.

## 2021-10-09 NOTE — Progress Notes (Signed)
Remote pacemaker transmission.   

## 2021-10-11 DIAGNOSIS — M545 Low back pain, unspecified: Secondary | ICD-10-CM | POA: Diagnosis not present

## 2021-10-18 DIAGNOSIS — Z794 Long term (current) use of insulin: Secondary | ICD-10-CM | POA: Diagnosis not present

## 2021-10-18 DIAGNOSIS — G720 Drug-induced myopathy: Secondary | ICD-10-CM | POA: Diagnosis not present

## 2021-10-18 DIAGNOSIS — D6869 Other thrombophilia: Secondary | ICD-10-CM | POA: Diagnosis not present

## 2021-10-18 DIAGNOSIS — J849 Interstitial pulmonary disease, unspecified: Secondary | ICD-10-CM | POA: Diagnosis not present

## 2021-10-18 DIAGNOSIS — R609 Edema, unspecified: Secondary | ICD-10-CM | POA: Diagnosis not present

## 2021-10-18 DIAGNOSIS — I130 Hypertensive heart and chronic kidney disease with heart failure and stage 1 through stage 4 chronic kidney disease, or unspecified chronic kidney disease: Secondary | ICD-10-CM | POA: Diagnosis not present

## 2021-10-18 DIAGNOSIS — I272 Pulmonary hypertension, unspecified: Secondary | ICD-10-CM | POA: Diagnosis not present

## 2021-10-18 DIAGNOSIS — R0602 Shortness of breath: Secondary | ICD-10-CM | POA: Diagnosis not present

## 2021-10-18 DIAGNOSIS — I5020 Unspecified systolic (congestive) heart failure: Secondary | ICD-10-CM | POA: Diagnosis not present

## 2021-10-18 DIAGNOSIS — E1169 Type 2 diabetes mellitus with other specified complication: Secondary | ICD-10-CM | POA: Diagnosis not present

## 2021-10-18 DIAGNOSIS — N1832 Chronic kidney disease, stage 3b: Secondary | ICD-10-CM | POA: Diagnosis not present

## 2021-10-18 DIAGNOSIS — Z950 Presence of cardiac pacemaker: Secondary | ICD-10-CM | POA: Diagnosis not present

## 2021-10-18 DIAGNOSIS — I10 Essential (primary) hypertension: Secondary | ICD-10-CM | POA: Diagnosis not present

## 2021-10-18 LAB — BASIC METABOLIC PANEL
BUN/Creatinine Ratio: 27 (ref 12–28)
BUN: 39 mg/dL — ABNORMAL HIGH (ref 8–27)
CO2: 34 mmol/L — ABNORMAL HIGH (ref 20–29)
Calcium: 9.5 mg/dL (ref 8.7–10.3)
Chloride: 99 mmol/L (ref 96–106)
Creatinine, Ser: 1.47 mg/dL — ABNORMAL HIGH (ref 0.57–1.00)
Glucose: 100 mg/dL — ABNORMAL HIGH (ref 70–99)
Potassium: 5 mmol/L (ref 3.5–5.2)
Sodium: 141 mmol/L (ref 134–144)
eGFR: 37 mL/min/{1.73_m2} — ABNORMAL LOW (ref >59)

## 2021-10-20 DIAGNOSIS — J849 Interstitial pulmonary disease, unspecified: Secondary | ICD-10-CM | POA: Diagnosis not present

## 2021-10-21 ENCOUNTER — Ambulatory Visit (INDEPENDENT_AMBULATORY_CARE_PROVIDER_SITE_OTHER): Payer: PPO

## 2021-10-21 ENCOUNTER — Encounter: Payer: Self-pay | Admitting: Pulmonary Disease

## 2021-10-21 ENCOUNTER — Ambulatory Visit: Payer: PPO | Admitting: Pulmonary Disease

## 2021-10-21 VITALS — BP 124/66 | HR 70 | Wt 231.0 lb

## 2021-10-21 DIAGNOSIS — I509 Heart failure, unspecified: Secondary | ICD-10-CM

## 2021-10-21 DIAGNOSIS — J849 Interstitial pulmonary disease, unspecified: Secondary | ICD-10-CM

## 2021-10-21 DIAGNOSIS — J9611 Chronic respiratory failure with hypoxia: Secondary | ICD-10-CM

## 2021-10-21 DIAGNOSIS — J8410 Pulmonary fibrosis, unspecified: Secondary | ICD-10-CM | POA: Diagnosis not present

## 2021-10-21 DIAGNOSIS — R0902 Hypoxemia: Secondary | ICD-10-CM | POA: Diagnosis not present

## 2021-10-21 LAB — VITAMIN D 25 HYDROXY (VIT D DEFICIENCY, FRACTURES): VITD: 63.91 ng/mL (ref 30.00–100.00)

## 2021-10-21 LAB — SEDIMENTATION RATE: Sed Rate: 22 mm/hr (ref 0–30)

## 2021-10-21 LAB — BRAIN NATRIURETIC PEPTIDE: Pro B Natriuretic peptide (BNP): 114 pg/mL — ABNORMAL HIGH (ref 0.0–100.0)

## 2021-10-21 NOTE — Patient Instructions (Signed)
Nice to see you  Will get a chest x-ray today to try to further figure out why you are short of breath, or your oxygen is dropping  Will get lab work today to also help [place] evaluate this issue  You must maintain an oxygen saturation of 88% or greater when exerting yourself or walking as well as when you are at rest.  If you are not able to maintain this with the oxygen, you must go to the emergency room for further evaluation.  I will see about getting the CT scan of the chest moved up to the next couple of weeks.  Return to clinic in September as previously scheduled, please call us if needed to be seen sooner

## 2021-10-21 NOTE — Progress Notes (Signed)
_0  ID: [name], female    DOB: [birth year], 76 y.o.   MRN: [id number]  Chief Complaint  Patient presents with   Follow-up    Pt is here due to oxygen dropping now when she tried to walk. Pt states she has gained weight and is taking the extra fluid pill. She states that her oxygen has been dropping for the past 3 weeks. She states she now weights 231. Pt is on 2L on POC     Referring provider: Velna Hatchet, MD  HPI:   76 y.o. woman whom we are seeing for evaluation of presumed pulmonary hypertension, ILD, chronic hypoxemic respiratory failure.    Since last visit, some worsening dyspnea.  Most recent cardiology note reviewed.  Weight is up 5 pounds at that time. Discussion and instruction for metolazone use.  She is noted weight gain since then.  At 237 pounds recently..  About 10 pounds above more recent weight, to 225-226.  More dyspneic.  More short of breath.  Noted worsening hypoxemia.  Dropping to the 80s despite 2 L use with exertion.  As low as 81.  She admits to not wearing oxygen all the time and dropping as low as the 70s even at rest.  Dropping while showering etc.  She noted weight gain has been taking metolazone in addition to her twice daily Lasix for 3 days.  Her weight is down to 232 today.  5 pounds.  Breathing does feel a bit easier.  HPI at initial visit: Patiently recently reestablished with pulmonary clinic.  Increased cough.  Some dyspnea on exertion as well.  In the past and followed with Dr. Chase Caller.  CT scan with interstitial changes, concern for hypersensitivity pneumonitis.  Did not follow-up.  Seen recently.  CT high-res shows worsening interlobular septal thickening, mosaicism, scattered nodular opacities concerning for hypersensitivity pneumonitis, sarcoidosis considered.  PA appeared enlarged on that CT scan.  This prompted TTE which demonstrated RV dysfunction, RA dilation.  Review of TTE dating back to 2020 shows RV dysfunction.  Not present  in 2018.  She has dyspnea.  With relatively robust exertion.  Normal walking outside bed.  Worse with inclines or stairs.  Worsening a bit over the last several months to year or 2.  PMH: Atrial fibrillation, hypertension, diastolic congestive heart failure, CAD Surgical history: CABG, cholecystectomy Family history:Mother with hypertension, CAD, hyperlipidemia, father with diabetes, CAD, CVA Social history: Never smoker, lives in [place] / Pulmonary Flowsheets:   ACT:      No data to display           MMRC:     No data to display           Epworth:      No data to display           Tests:   FENO:  No results found for: "NITRICOXIDE"  PFT:    Latest Ref Rng & Units 08/29/2021    9:55 AM 03/01/2019    8:56 AM 12/12/2018    4:50 PM 12/28/2017    3:47 PM  PFT Results  FVC-Pre L 1.25  1.55  1.23    FVC-Predicted Pre % 45  54  42  56   FVC-Post L 1.25    1.61   FVC-Predicted Post % 45    55   Pre FEV1/FVC % % 93  92  92  91   Post FEV1/FCV % % 93  91   FEV1-Pre L 1.16  1.43  1.12  1.49   FEV1-Predicted Pre % 56  66  52  68   FEV1-Post L 1.17    1.47   DLCO uncorrected ml/min/mmHg 10.88  18.22  11.35  15.97   DLCO UNC% % 57  95  59  67   DLCO corrected ml/min/mmHg 10.88   12.17    DLCO COR %Predicted % 57   64    DLVA Predicted % 122  155  152  121   TLC L 2.71    2.93   TLC % Predicted % 54    58   RV % Predicted % 54    52   02/2019 reviewed and interpreted as spirometry suggestive of moderate restriction, DLCO within normal limits 11/2018 reviewed and interpreted as chronically suggestive of moderate restriction, DLCO moderately reduced 12/2017 reviewed and interpreted as primary suggestive of my restriction versus air trapping, no bronchodilator response, lung volumes consistent with moderate restriction, DLCO moderately reduced 08/2021 reviewed interpreted spirometry suggestive of severe restriction versus air  trapping, no bronchodilator response, lung volumes consistent with moderate restriction with moderately reduced DLCO WALK:     06/28/2019   11:15 AM 03/01/2019   10:31 AM 12/28/2018    3:27 PM 02/04/2018    9:39 AM  SIX MIN WALK  Supplimental Oxygen during Test? (L/min) No No No No  Tech Comments: slow pace walk, SOB after first lap, three stops to rest, sats WNL normal pace, knee pain, no dsat TA/CMA Pt did not want to continue due to leg pain. Pt did have some dypsnea. Pt ambulated at a slow pace with a steady gait.     Imaging: Personally reviewed No results found.  Lab Results: Personally reviewed CBC    Component Value Date/Time   WBC 10.9 (H) 03/24/2019 1237   WBC 9.5 12/12/2018 0321   RBC 4.13 03/24/2019 1237   RBC 3.83 (L) 12/12/2018 0321   HGB 12.2 03/24/2019 1237   HCT 36.5 03/24/2019 1237   PLT 381 03/24/2019 1237   MCV 88 03/24/2019 1237   MCH 29.5 03/24/2019 1237   MCH 29.8 12/12/2018 0321   MCHC 33.4 03/24/2019 1237   MCHC 31.7 12/12/2018 0321   RDW 14.0 03/24/2019 1237   LYMPHSABS 3.7 12/12/2018 0321   MONOABS 0.7 12/12/2018 0321   EOSABS 0.2 12/12/2018 0321   BASOSABS 0.1 12/12/2018 0321    BMET    Component Value Date/Time   NA 141 10/18/2021 0925   K 5.0 10/18/2021 0925   CL 99 10/18/2021 0925   CO2 34 (H) 10/18/2021 0925   GLUCOSE 100 (H) 10/18/2021 0925   GLUCOSE 197 (H) 12/13/2018 0411   BUN 39 (H) 10/18/2021 0925   CREATININE 1.47 (H) 10/18/2021 0925   CALCIUM 9.5 10/18/2021 0925   GFRNONAA 66 03/24/2019 1237   GFRAA 76 03/24/2019 1237    BNP    Component Value Date/Time   BNP 110.5 (H) 12/08/2018 1653    ProBNP    Component Value Date/Time   PROBNP 51.0 05/24/2010 0013    Specialty Problems       Pulmonary Problems   Sleep apnea-unable to tolerate C-pap   Amiodarone pulmonary toxicity   Restrictive lung disease   ILD (interstitial lung disease) (HCC)    Allergies  Allergen Reactions   Amiodarone     Other  reaction(s): pulmonary fibrosis   Gabapentin     Other reaction(s): edema    Immunization History  Administered Date(s) Administered   Pneumococcal Conjugate-13 12/09/2016    Past Medical History:  Diagnosis Date   Asthmatic bronchitis    CHF (congestive heart failure) ([place]) dx'd 2016   Coronary atherosclerosis of native coronary artery    a. s/p CABG 2005 Dr Roxy Manns     Hyperlipidemia    Hypertension    Hypertensive heart disease    Kidney stones    Mixed hyperlipidemia    Nephrolithiasis 06/07/2015   Paroxysmal atrial fibrillation ([place])    a. s/p Maze in 2005 b. recurrence in March 2016 with >8sec posttermination pauses s/p PPM implant     Pneumonia 1950   "double"   Presence of permanent cardiac pacemaker    Restrictive lung disease 03/03/2018   Sleep apnea    "tested; mask ordered; couldn't afford it; will get it now" (09/04/2015)   TIA (transient ischemic attack)    Type II diabetes mellitus ([place])    Uterine cancer [facility])    age 36 with partial hysterectomy    Tobacco History: Social History   Tobacco Use  Smoking Status Never  Smokeless Tobacco Never   Counseling given: Not Answered   Continue to not smoke  Outpatient Encounter Medications as of 10/21/2021  Medication Sig   acetaminophen (TYLENOL) 500 MG tablet Take 500-1,000 mg by mouth every 6 (six) hours as needed for moderate pain.   B Complex-Biotin-FA (B-COMPLEX PO) Take 1 capsule by mouth every morning.    BIOTIN 5000 PO Take 1 tablet by mouth daily.   Cholecalciferol (VITAMIN D3) 1000 UNITS CAPS Take 1,000 Units by mouth daily.   Coenzyme Q10 (CO Q-10) 200 MG CAPS Take 200 mg by mouth daily.   diphenhydramine-acetaminophen (TYLENOL PM) 25-500 MG TABS tablet Take 1 tablet by mouth at bedtime as needed (pain/sleep).    ELIQUIS 5 MG TABS tablet TAKE ONE TABLET TWICE DAILY   FARXIGA 10 MG TABS tablet Take 10 mg by mouth daily.   fenofibrate 160 MG tablet TAKE 1 TABLET DAILY   furosemide (LASIX) 40 MG  tablet TAKE (1) TABLET TWICE A DAY.   HUMALOG MIX 75/25 KWIKPEN (75-25) 100 UNIT/ML Kwikpen Inject 34-38 Units into the skin in the morning, at noon, and at bedtime.   losartan (COZAAR) 50 MG tablet Take 50 mg by mouth daily. Take 1 Tablet Daily   metolazone (ZAROXOLYN) 2.5 MG tablet Take 2.5 mg by mouth daily as needed. Take 30 minutes prior to taking Lasix   metoprolol succinate (TOPROL-XL) 50 MG 24 hr tablet Take 50 mg by mouth daily. Take 1 Tablet Daily with or immediately following a meal.   potassium chloride (KLOR-CON) 10 MEQ tablet Take 20 mEq by mouth daily.   No facility-administered encounter medications on file as of 10/21/2021.     Review of Systems  Review of Systems  N/a Physical Exam  BP 124/66 (BP Location: Left Arm, Patient Position: Sitting, Cuff Size: Normal)   Pulse 70   Wt 231 lb (104.8 kg)   SpO2 93%   BMI 39.65 kg/m   Wt Readings from Last 5 Encounters:  10/21/21 231 lb (104.8 kg)  10/02/21 232 lb 9.6 oz (105.5 kg)  08/29/21 226 lb 12.8 oz (102.9 kg)  07/01/21 227 lb 3.2 oz (103.1 kg)  06/04/21 230 lb (104.3 kg)    BMI Readings from Last 5 Encounters:  10/21/21 39.65 kg/m  10/02/21 39.93 kg/m  08/29/21 40.18 kg/m  07/01/21 39.00 kg/m  06/04/21 39.48 kg/m     Physical Exam General:  Sitting in chair, no acute distress Eyes: EOMI, icterus Neck: Supple, no JVP Pulmonary: Faint crackles upper lung fields, otherwise clear, normal work of breathing Cardiovascular: Regular rate and rhythm, no murmur Bowel sounds present MSK: No synovitis, no joint effusion Neuro: Normal gait, no weakness Psych: Normal mood, full affect   Assessment & Plan:   Presumed pulmonary hypertension: Suspect this is multifactorial.  Longstanding evidence of left-sided structural disease including mitral valve stenosis dating back to 2018, dilated left atrium and 2020, and ongoing dilated left atrium more recently 2023.  There is been RV dysfunction since at least 11/2018  including right atrial pressure elevation at that time.  Other contributors include group 2 disease given her interstitial lung disease on CT scan.  High concern for undiagnosed hypoxemia given significant interstitial changes.  Hypoxemia and ILD would be likely contributors and group 3.  Untreated sleep apnea likely contributing.  Encouraged to resume CPAP therapy.  No known history of VTE.  Low suspicion for Group 1 disease.  Overall, not good candidate for pulmonary vasodilators given left-sided disease as well as her inability to tolerate or use CPAP.   Can reconsider in the future, will require right heart catheterization if they decide to pursue pulmonary vasodilators.  Hypoxemic respiratory failure: Worse over the last 3 weeks or so.  In the setting of weight gain, weight up to about 237, 10 pounds higher than more recent baseline.  Weight down 5 pounds of metolazone use with mild improvement in dyspnea exertion.  High suspicion for volume overload.  Chest x-ray, BNP today.  Also will check BMP given recent worsening renal function.  High suspicion for CHF.  Also possibility of worsening ILD.  Will check for inflammatory markers today.  Interstitial lung disease: On CT scan, worse 2023 compared to 2020.  HSP versus NSIP in my opinion.  Consider antifibrotic's versus immunosuppressants in the future.  Repeat CT scan 11/2021.  PFTs largely unchanged in 2-1/2-year interval.  Check inflammatory markers today.   Return in about 3 months (around 01/21/2022).   Lanier Clam, MD 10/21/2021   This appointment required 42 minutes of patient care (this includes precharting, chart review, review of results, face-to-face care, etc.).

## 2021-10-22 DIAGNOSIS — J849 Interstitial pulmonary disease, unspecified: Secondary | ICD-10-CM | POA: Diagnosis not present

## 2021-10-22 LAB — BASIC METABOLIC PANEL
BUN: 39 mg/dL — ABNORMAL HIGH (ref 6–23)
CO2: 39 mEq/L — ABNORMAL HIGH (ref 19–32)
Calcium: 9.9 mg/dL (ref 8.4–10.5)
Chloride: 90 mEq/L — ABNORMAL LOW (ref 96–112)
Creatinine, Ser: 1.63 mg/dL — ABNORMAL HIGH (ref 0.40–1.20)
GFR: 30.67 mL/min — ABNORMAL LOW (ref >60.00)
Glucose, Bld: 106 mg/dL — ABNORMAL HIGH (ref 70–99)
Potassium: 4.1 mEq/L (ref 3.5–5.1)
Sodium: 141 mEq/L (ref 135–145)

## 2021-10-22 LAB — C-REACTIVE PROTEIN: CRP: <1 mg/dL (ref 0.5–20.0)

## 2021-10-23 ENCOUNTER — Telehealth: Payer: Self-pay

## 2021-10-23 NOTE — Telephone Encounter (Addendum)
Called patient regarding results. Patient had understanding of results. ----- Message from Warren Lacy, PA-C sent at 10/23/2021  1:52 PM EDT ----- Kidney function has improved but is not back at baseline yet.  Potassium normal.  Continue following recommendations: -- Try to minimize metolazone use.  When you do have leg swelling/weight gain of 5 pounds, remember to take metolazone 30 minutes before your Lasix dose for it to be most effective. -- Stay off Wilder Glade for now.   -- Continue Lasix 40 mg twice a day. -- Keep follow-up with me next month.  We will recheck labs then.

## 2021-10-23 NOTE — Progress Notes (Signed)
Labs appear consistent with volume overload. Chest xray looks like pulmonary edema to my eye. Kidney function is stable compared to a couple of weeks but worse overall compared to earlier in year.   April, can you check in to see how she is doing? Is oxygen staying up? How is the weight?

## 2021-10-24 NOTE — Progress Notes (Signed)
If oxygen is continuing to drop below 88% even when increasing oxygen flow,  I recommend she go to the ER.

## 2021-11-13 ENCOUNTER — Other Ambulatory Visit: Payer: Self-pay | Admitting: Cardiovascular Disease

## 2021-11-20 DIAGNOSIS — J849 Interstitial pulmonary disease, unspecified: Secondary | ICD-10-CM | POA: Diagnosis not present

## 2021-11-22 DIAGNOSIS — J849 Interstitial pulmonary disease, unspecified: Secondary | ICD-10-CM | POA: Diagnosis not present

## 2021-11-25 ENCOUNTER — Ambulatory Visit (HOSPITAL_BASED_OUTPATIENT_CLINIC_OR_DEPARTMENT_OTHER): Payer: PPO

## 2021-12-05 ENCOUNTER — Ambulatory Visit: Payer: PPO | Admitting: Pulmonary Disease

## 2021-12-10 NOTE — Progress Notes (Unsigned)
Cardiology Office Note:    Date:  12/10/2021   ID:  [name], DOB 02-02-1946, MRN [id number]  PCP:  Velna Hatchet, [place] Cardiologist: Shelva Majestic, MD   Reason for visit: 2 month follow-up  History of Present Illness:    [name] is a 76 y.o. female with a hx of HTN, HLD, TIA, DM, Afib, CAD (CABG and MAZE 2005), post termination pauses >> PPM, ILD, OSA, mitral stenosis.  Hx of PPM, AV node ablation in January 2021 for uncontrolled atrial fibrillation.  With her CAD, she had back, shoulder and jaw pain.  ILD followed by pulm,  2 L/min nasal cannula as needed particularly with exertion.  PFTs stable over the last 2 years.    I last saw in July 2023.  Patient was found to have acute kidney injury with creatinine 1.67 up from baseline around 0.9.  At that time, she was taking  Lasix 40 mg twice a day and metolazone 2.5 mg as needed and was a new start on Farxiga.  Her dry weight was 225 pounds.    With AKI, Farxiga discontinued.  Recommended minimizing metolazone use.  Cr improved to 1.47 on 10/18/21.  Patient saw pulmonary August 7, with weight up 10 pounds in O2 sats in the 80s, she had taken metolazone for 3 days.  CXR consistent with pulm edema.  Recheck creatinine 1.63.  *?torsemide    Chronic diastolic heart failure, near euvolemic -2D echo March 2023 with EF 60 to 65%, mildly enlarged RV, moderately elevated PA pressures, mild to moderate mitral stenosis  -Continue Lasix 40 mg twice daily. -Instructed correct use of metolazone -take 30 minutes prior to Lasix dose. -Continue following daily weights with a dry weight estimated 225 pounds on home scale. -Check BMET today.   CAD, no angina -Hx of CABG 2005 -No aspirin given need for anticoagulation.   Permanent atrial fibrillation -MAZE 2005 -AV node ablation Jan 2021 -Amiodarone stopped Oct 2019 2/2 concerns of pulmonary tox -Followed by EP; follows PPM interrogations.   Hypertension, well  controlled -Continue losartan 50 mg once daily.    Disposition - Follow-up in ***    Past Medical History:  Diagnosis Date   Asthmatic bronchitis    CHF (congestive heart failure) ([place]) dx'd 2016   Coronary atherosclerosis of native coronary artery    a. s/p CABG 2005 Dr Roxy Manns     Hyperlipidemia    Hypertension    Hypertensive heart disease    Kidney stones    Mixed hyperlipidemia    Nephrolithiasis 06/07/2015   Paroxysmal atrial fibrillation ([place])    a. s/p Maze in 2005 b. recurrence in March 2016 with >8sec posttermination pauses s/p PPM implant     Pneumonia 1950   "double"   Presence of permanent cardiac pacemaker    Restrictive lung disease 03/03/2018   Sleep apnea    "tested; mask ordered; couldn't afford it; will get it now" (09/04/2015)   TIA (transient ischemic attack)    Type II diabetes mellitus ([place])    Uterine cancer [facility])    age 35 with partial hysterectomy    Past Surgical History:  Procedure Laterality Date   AV NODE ABLATION N/A 03/28/2019   Procedure: AV NODE ABLATION;  Surgeon: Evans Lance, MD;  Location: [facility];  Service: Cardiovascular;  Laterality: N/A;   CARDIAC CATHETERIZATION  2005   CORONARY ARTERY BYPASS GRAFT  2005   Dr Roxy Manns with MAZE   CYSTOSCOPY  WITH URETEROSCOPY, STONE BASKETRY AND STENT PLACEMENT Left 09/08/2014   Procedure: CYSTOSCOPY WITH URETEROSCOPY, STONE BASKETRY AND STENT PLACEMENT;  Surgeon: Cleon Gustin, MD;  Location: [facility];  Service: Urology;  Laterality: Left;   CYSTOSCOPY/URETEROSCOPY/HOLMIUM LASER/STENT PLACEMENT Left 09/15/2014   Procedure: CYSTOSCOPY/RETROGRADE/URETEROSCOPY/HOLMIUM LASER/ STONE EXTRACTION /STENT EXCHANGE;  Surgeon: Cleon Gustin, MD;  Location: [facility];  Service: Urology;  Laterality: Left;   HOLMIUM LASER APPLICATION Left 3/54/5625   Procedure: HOLMIUM LASER APPLICATION;  Surgeon: Cleon Gustin, MD;  Location: [facility];  Service: Urology;  Laterality: Left;   HOLMIUM LASER APPLICATION  Left 08/18/8935   Procedure: HOLMIUM LASER APPLICATION;  Surgeon: Cleon Gustin, MD;  Location: [facility];  Service: Urology;  Laterality: Left;   INSERT / REPLACE / REMOVE PACEMAKER     LAPAROSCOPIC CHOLECYSTECTOMY     PERMANENT PACEMAKER INSERTION N/A 06/05/2014   MDT 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     Allergies:   Amiodarone and Gabapentin   Social History   Socioeconomic History   Marital status: Married    Spouse name: Not on file   Number of children: Not on file   Years of education: Not on file   Highest education level: Not on file  Occupational History   Not on file  Tobacco Use   Smoking status: Never   Smokeless tobacco: Never  Vaping Use   Vaping Use: Never used  Substance and Sexual Activity   Alcohol use: No   Drug use: No   Sexual activity: Not on file  Other Topics Concern   Not on file  Social History Narrative   Not on file   Social Determinants of Health   Financial Resource Strain: Low Risk  (12/09/2018)   Overall Financial Resource Strain (CARDIA)    Difficulty of Paying Living Expenses: Not hard at all  Food Insecurity: Unknown (12/09/2018)   Hunger Vital Sign    Worried About Running Out of Food in the Last Year: Patient refused    [place] in the Last Year: Patient refused  Transportation Needs: Unknown (12/09/2018)   PRAPARE - Hydrologist (Medical): Patient refused    Lack of Transportation (Non-Medical): Patient refused  Physical Activity: Not on file  Stress: Not on file  Social Connections: Not on file     Family History: The patient's family history includes Arthritis in her brother; Diabetes in her father; Heart attack in her maternal grandfather; Heart disease in her father  and mother; Hyperlipidemia in her mother; Hypertension in her daughter, mother, and sister; Stroke in her father and paternal grandfather.  ROS:   Please see the history of present illness.     EKGs/Labs/Other Studies Reviewed:    EKG:  The ekg ordered today demonstrates ***  Recent Labs: 10/21/2021: BUN 39; Creatinine, Ser 1.63; Potassium 4.1; Pro B Natriuretic peptide (BNP) 114.0; Sodium 141   Recent Lipid Panel Lab Results  Component Value Date/Time   CHOL 188 03/19/2018 09:31 AM   TRIG 228 (H) 03/19/2018 09:31 AM   HDL 48 03/19/2018 09:31 AM   LDLCALC 94 03/19/2018 09:31 AM    Physical Exam:    VS:  There were no vitals taken for this visit.   No data found.  No BP  recorded.  {Refresh Note OR Click here to enter BP  :1}***    Wt Readings from Last 3 Encounters:  10/21/21 231 lb (104.8 kg)  10/02/21 232 lb 9.6 oz (105.5 kg)  08/29/21 226 lb 12.8 oz (102.9 kg)     GEN: *** Well nourished, well developed in no acute distress HEENT: Normal NECK: No JVD; No carotid bruits CARDIAC: ***RRR, no murmurs, rubs, gallops RESPIRATORY:  Clear to auscultation without rales, wheezing or rhonchi  ABDOMEN: Soft, non-tender, non-distended MUSCULOSKELETAL: No edema; No deformity  SKIN: Warm and dry NEUROLOGIC:  Alert and oriented PSYCHIATRIC:  Normal affect     ASSESSMENT AND PLAN   ***   {Are you ordering a CV Procedure (e.g. stress test, cath, DCCV, TEE, etc)?   Press F2        :[id number]}    Medication Adjustments/Labs and Tests Ordered: Current medicines are reviewed at length with the patient today.  Concerns regarding medicines are outlined above.  No orders of the defined types were placed in this encounter.  No orders of the defined types were placed in this encounter.   There are no Patient Instructions on file for this visit.   Signed, Warren Lacy, PA-C  12/10/2021 1:44 PM    [facility]

## 2021-12-11 ENCOUNTER — Telehealth: Payer: Self-pay | Admitting: Physician Assistant

## 2021-12-11 ENCOUNTER — Ambulatory Visit: Payer: PPO | Attending: Physician Assistant | Admitting: Physician Assistant

## 2021-12-11 ENCOUNTER — Encounter: Payer: Self-pay | Admitting: Physician Assistant

## 2021-12-11 VITALS — BP 155/85 | HR 70 | Ht 64.0 in | Wt 229.0 lb

## 2021-12-11 DIAGNOSIS — I2510 Atherosclerotic heart disease of native coronary artery without angina pectoris: Secondary | ICD-10-CM | POA: Diagnosis not present

## 2021-12-11 DIAGNOSIS — I110 Hypertensive heart disease with heart failure: Secondary | ICD-10-CM

## 2021-12-11 DIAGNOSIS — I5032 Chronic diastolic (congestive) heart failure: Secondary | ICD-10-CM

## 2021-12-11 DIAGNOSIS — I4821 Permanent atrial fibrillation: Secondary | ICD-10-CM | POA: Diagnosis not present

## 2021-12-11 DIAGNOSIS — I10 Essential (primary) hypertension: Secondary | ICD-10-CM

## 2021-12-11 MED ORDER — TORSEMIDE 20 MG PO TABS: 20.0000 mg | ORAL_TABLET | Freq: Two times a day (BID) | ORAL | 3 refills | Status: DC

## 2021-12-11 NOTE — Telephone Encounter (Signed)
Patient's daughter states the patient is not feeling well today and she is requesting to convert 10:30 AM office visit with Caron Presume, PA to a virtual. Please advise.

## 2021-12-11 NOTE — Patient Instructions (Addendum)
Medication Instructions:  STOP Lasix (Furosemide)  START Torsemide 20 mg twice daily   *If you need a refill on your cardiac medications before your next appointment, please call your pharmacy*   Lab Work: Please bring copy of blood work from your primary care visit to the upcoming appointment  If you have labs (blood work) drawn today and your tests are completely normal, you will receive your results only by: [place] (if you have MyChart) OR A paper copy in the mail If you have any lab test that is abnormal or we need to change your treatment, we will call you to review the results.  Follow-Up: At [facility], you and your health needs are our priority.  As part of our continuing mission to provide you with exceptional heart care, we have created designated Provider Care Teams.  These Care Teams include your primary Cardiologist (physician) and Advanced Practice Providers (APPs -  Physician Assistants and Nurse Practitioners) who all work together to provide you with the care you need, when you need it.  We recommend signing up for the patient portal called "MyChart".  Sign up information is provided on this After Visit Summary.  MyChart is used to connect with patients for Virtual Visits (Telemedicine).  Patients are able to view lab/test results, encounter notes, upcoming appointments, etc.  Non-urgent messages can be sent to your provider as well.   To learn more about what you can do with MyChart, go to [email or web address].    Your next appointment:   October 25th @ 11:20 AM   The format for your next appointment:   In Person  Provider:   Caron Presume, PA-C    Please bring blood pressure cuff to upcoming visit.

## 2021-12-11 NOTE — Telephone Encounter (Signed)
Called patient daughter, spoke with Anderson Malta she is okay to do a virtual visit. Her mother does not have a smart phone and is unable to do a video visit. They will need a telephone visit with this number: [phone].  Will update appointment information and notes.   Thanks!

## 2021-12-14 ENCOUNTER — Ambulatory Visit (HOSPITAL_BASED_OUTPATIENT_CLINIC_OR_DEPARTMENT_OTHER)
Admission: RE | Admit: 2021-12-14 | Discharge: 2021-12-14 | Disposition: A | Discharge status: Home / Self Care | Payer: PPO | Source: Ambulatory Visit | Attending: Pulmonary Disease | Admitting: Pulmonary Disease

## 2021-12-14 DIAGNOSIS — J849 Interstitial pulmonary disease, unspecified: Secondary | ICD-10-CM | POA: Diagnosis not present

## 2021-12-14 DIAGNOSIS — R918 Other nonspecific abnormal finding of lung field: Secondary | ICD-10-CM | POA: Diagnosis not present

## 2021-12-17 ENCOUNTER — Ambulatory Visit (INDEPENDENT_AMBULATORY_CARE_PROVIDER_SITE_OTHER): Payer: PPO

## 2021-12-17 DIAGNOSIS — I442 Atrioventricular block, complete: Secondary | ICD-10-CM

## 2021-12-17 DIAGNOSIS — E785 Hyperlipidemia, unspecified: Secondary | ICD-10-CM | POA: Diagnosis not present

## 2021-12-17 DIAGNOSIS — R7989 Other specified abnormal findings of blood chemistry: Secondary | ICD-10-CM | POA: Diagnosis not present

## 2021-12-17 DIAGNOSIS — N1832 Chronic kidney disease, stage 3b: Secondary | ICD-10-CM | POA: Diagnosis not present

## 2021-12-17 DIAGNOSIS — E1169 Type 2 diabetes mellitus with other specified complication: Secondary | ICD-10-CM | POA: Diagnosis not present

## 2021-12-17 DIAGNOSIS — I10 Essential (primary) hypertension: Secondary | ICD-10-CM | POA: Diagnosis not present

## 2021-12-18 ENCOUNTER — Ambulatory Visit: Payer: PPO | Admitting: Pulmonary Disease

## 2021-12-18 ENCOUNTER — Encounter: Payer: Self-pay | Admitting: Pulmonary Disease

## 2021-12-18 VITALS — BP 122/62 | HR 70 | Temp 98.4°F | Wt 229.0 lb

## 2021-12-18 DIAGNOSIS — J984 Other disorders of lung: Secondary | ICD-10-CM

## 2021-12-18 DIAGNOSIS — J849 Interstitial pulmonary disease, unspecified: Secondary | ICD-10-CM

## 2021-12-18 DIAGNOSIS — T462X5A Adverse effect of other antidysrhythmic drugs, initial encounter: Secondary | ICD-10-CM | POA: Diagnosis not present

## 2021-12-18 LAB — CUP PACEART REMOTE DEVICE CHECK
Battery Remaining Longevity: 15 mo
Battery Voltage: 2.9 V
Brady Statistic RA Percent Paced: 0 %
Brady Statistic RV Percent Paced: 98.33 %
Date Time Interrogation Session: 20231004111758
Implantable Lead Implant Date: 20160321
Implantable Lead Implant Date: 20160321
Implantable Lead Location: 753859
Implantable Lead Location: 753860
Implantable Lead Model: 5076
Implantable Lead Model: 5076
Implantable Pulse Generator Implant Date: 20160321
Lead Channel Impedance Value: 361 Ohm
Lead Channel Impedance Value: 475 Ohm
Lead Channel Impedance Value: 475 Ohm
Lead Channel Impedance Value: 513 Ohm
Lead Channel Pacing Threshold Amplitude: 0.5 V
Lead Channel Pacing Threshold Amplitude: 0.875 V
Lead Channel Pacing Threshold Pulse Width: 0.4 ms
Lead Channel Pacing Threshold Pulse Width: 0.4 ms
Lead Channel Sensing Intrinsic Amplitude: 0.625 mV
Lead Channel Sensing Intrinsic Amplitude: 0.625 mV
Lead Channel Sensing Intrinsic Amplitude: 5 mV
Lead Channel Sensing Intrinsic Amplitude: 5 mV
Lead Channel Setting Pacing Amplitude: 2.5 V
Lead Channel Setting Pacing Pulse Width: 0.4 ms
Lead Channel Setting Sensing Sensitivity: 4 mV

## 2021-12-18 NOTE — Progress Notes (Signed)
_0  ID: [name], female    DOB: Aug 24, 1945, 76 y.o.   MRN: [id number]  Chief Complaint  Patient presents with   Follow-up    Follow up for ILD. Pt is here today to go over Ct scan results from scan done 10/2.     Referring provider: Velna Hatchet, MD  HPI:   76 y.o. woman whom we are seeing for evaluation of presumed pulmonary hypertension, ILD, chronic hypoxemic respiratory failure.  Most recent cardiology note reviewed.  Returns to discuss CT scan high-resolution performed recently.  States her diuretic regimen was increased from Lasix 20 mg twice daily to torsemide 20 mg twice daily.  She was encouraged not to take metolazone so often.  She is frustrated as her lower extremity swelling seems not improved at all.  She and family member in the room feel like to maybe look worse.  Notably, her weight down 2 pounds today compared to about 2 months ago in our clinic.  CT high-resolution obtained earlier this week on my review interpretation reveals similar interlobular septal thickening and nodularity compared to 05/2021 with significant mosaicism which is markedly worse compared to 2020 on my review and interpretation.  HPI at initial visit: Patiently recently reestablished with pulmonary clinic.  Increased cough.  Some dyspnea on exertion as well.  In the past and followed with Dr. Chase Caller.  CT scan with interstitial changes, concern for hypersensitivity pneumonitis.  Did not follow-up.  Seen recently.  CT high-res shows worsening interlobular septal thickening, mosaicism, scattered nodular opacities concerning for hypersensitivity pneumonitis, sarcoidosis considered.  PA appeared enlarged on that CT scan.  This prompted TTE which demonstrated RV dysfunction, RA dilation.  Review of TTE dating back to 2020 shows RV dysfunction.  Not present in 2018.  She has dyspnea.  With relatively robust exertion.  Normal walking outside bed.  Worse with inclines or stairs.  Worsening a  bit over the last several months to year or 2.  PMH: Atrial fibrillation, hypertension, diastolic congestive heart failure, CAD Surgical history: CABG, cholecystectomy Family history:Mother with hypertension, CAD, hyperlipidemia, father with diabetes, CAD, CVA Social history: Never smoker, lives in [place] / Pulmonary Flowsheets:   ACT:      No data to display           MMRC:     No data to display           Epworth:      No data to display           Tests:   FENO:  No results found for: "NITRICOXIDE"  PFT:    Latest Ref Rng & Units 08/29/2021    9:55 AM 03/01/2019    8:56 AM 12/12/2018    4:50 PM 12/28/2017    3:47 PM  PFT Results  FVC-Pre L 1.25  1.55  1.23    FVC-Predicted Pre % 45  54  42  56   FVC-Post L 1.25    1.61   FVC-Predicted Post % 45    55   Pre FEV1/FVC % % 93  92  92  91   Post FEV1/FCV % % 93    91   FEV1-Pre L 1.16  1.43  1.12  1.49   FEV1-Predicted Pre % 56  66  52  68   FEV1-Post L 1.17    1.47   DLCO uncorrected ml/min/mmHg 10.88  18.22  11.35  15.97   DLCO UNC% %  57  95  59  67   DLCO corrected ml/min/mmHg 10.88   12.17    DLCO COR %Predicted % 57   64    DLVA Predicted % 122  155  152  121   TLC L 2.71    2.93   TLC % Predicted % 54    58   RV % Predicted % 54    52   02/2019 reviewed and interpreted as spirometry suggestive of moderate restriction, DLCO within normal limits 11/2018 reviewed and interpreted as chronically suggestive of moderate restriction, DLCO moderately reduced 12/2017 reviewed and interpreted as primary suggestive of my restriction versus air trapping, no bronchodilator response, lung volumes consistent with moderate restriction, DLCO moderately reduced 08/2021 reviewed interpreted spirometry suggestive of severe restriction versus air trapping, no bronchodilator response, lung volumes consistent with moderate restriction with moderately reduced DLCO WALK:      06/28/2019   11:15 AM 03/01/2019   10:31 AM 12/28/2018    3:27 PM 02/04/2018    9:39 AM  SIX MIN WALK  Supplimental Oxygen during Test? (L/min) No No No No  Tech Comments: slow pace walk, SOB after first lap, three stops to rest, sats WNL normal pace, knee pain, no dsat TA/CMA Pt did not want to continue due to leg pain. Pt did have some dypsnea. Pt ambulated at a slow pace with a steady gait.     Imaging: Personally reviewed CT Chest High Resolution  Result Date: 12/16/2021 CLINICAL DATA:  Worsening shortness of breath. EXAM: CT CHEST WITHOUT CONTRAST TECHNIQUE: Multidetector CT imaging of the chest was performed following the standard protocol without intravenous contrast. High resolution imaging of the lungs, as well as inspiratory and expiratory imaging, was performed. RADIATION DOSE REDUCTION: This exam was performed according to the departmental dose-optimization program which includes automated exposure control, adjustment of the mA and/or kV according to patient size and/or use of iterative reconstruction technique. COMPARISON:  05/29/2021, 12/11/2018. FINDINGS: Cardiovascular: Atherosclerotic calcification of the aorta and aortic valve. Enlarged pulmonic trunk and heart. No pericardial effusion. Mediastinum/Nodes: Mediastinal lymph nodes measure up to 11 mm in the low right paratracheal station, as before. Hilar regions are difficult to evaluate without IV contrast. No axillary adenopathy. Esophagus is grossly unremarkable. Lungs/Pleura: Mosaic pulmonary parenchymal attenuation. Reticulonodular interstitial thickening with fissural nodularity, similar to 05/29/2021 but new from 12/11/2018. Right lower lobe nodules measure up to 5 mm (4/67), unchanged from 12/11/2018 and benign. Nonspecific follow-up necessary. Significant air trapping. No pleural fluid. Airway is unremarkable. Upper Abdomen: Visualized portions of the liver, adrenal glands, kidneys, spleen, pancreas and stomach are grossly  unremarkable. No upper abdominal adenopathy. Musculoskeletal: Degenerative changes in the spine. No worrisome lytic or sclerotic lesions. IMPRESSION: 1. Reticulonodular interstitial thickening with fissural nodularity, similar to 05/29/2021 but new from 12/11/2018. Marked air trapping. Findings are favored to represent sarcoid. Findings are suggestive of an alternative diagnosis (not UIP) per consensus guidelines: Diagnosis of Idiopathic Pulmonary Fibrosis: An Official ATS/ERS/JRS/ALAT Clinical Practice Guideline. [place], Iss 5, [phone], Nov 15 2016. 2.  Aortic atherosclerosis (ICD10-I70.0). 3. Enlarged pulmonic trunk, indicative of pulmonary arterial hypertension. Electronically Signed   By: Lorin Picket M.D.   On: 12/16/2021 16:33    Lab Results: Personally reviewed CBC    Component Value Date/Time   WBC 10.9 (H) 03/24/2019 1237   WBC 9.5 12/12/2018 0321   RBC 4.13 03/24/2019 1237   RBC 3.83 (L) 12/12/2018 0321   HGB 12.2 03/24/2019 1237  HCT 36.5 03/24/2019 1237   PLT 381 03/24/2019 1237   MCV 88 03/24/2019 1237   MCH 29.5 03/24/2019 1237   MCH 29.8 12/12/2018 0321   MCHC 33.4 03/24/2019 1237   MCHC 31.7 12/12/2018 0321   RDW 14.0 03/24/2019 1237   LYMPHSABS 3.7 12/12/2018 0321   MONOABS 0.7 12/12/2018 0321   EOSABS 0.2 12/12/2018 0321   BASOSABS 0.1 12/12/2018 0321    BMET    Component Value Date/Time   NA 141 10/21/2021 1227   NA 141 10/18/2021 0925   K 4.1 10/21/2021 1227   CL 90 (L) 10/21/2021 1227   CO2 39 (H) 10/21/2021 1227   GLUCOSE 106 (H) 10/21/2021 1227   BUN 39 (H) 10/21/2021 1227   BUN 39 (H) 10/18/2021 0925   CREATININE 1.63 (H) 10/21/2021 1227   CALCIUM 9.9 10/21/2021 1227   GFRNONAA 66 03/24/2019 1237   GFRAA 76 03/24/2019 1237    BNP    Component Value Date/Time   BNP 110.5 (H) 12/08/2018 1653    ProBNP    Component Value Date/Time   PROBNP 114.0 (H) 10/21/2021 1227    Specialty Problems       Pulmonary  Problems   Sleep apnea-unable to tolerate C-pap   Amiodarone pulmonary toxicity   Restrictive lung disease   ILD (interstitial lung disease) (HCC)    Allergies  Allergen Reactions   Amiodarone     Other reaction(s): pulmonary fibrosis   Gabapentin     Other reaction(s): edema    Immunization History  Administered Date(s) Administered   Pneumococcal Conjugate-13 12/09/2016    Past Medical History:  Diagnosis Date   Asthmatic bronchitis    CHF (congestive heart failure) ([facility]) dx'd 2016   Coronary atherosclerosis of native coronary artery    a. s/p CABG 2005 Dr Roxy Manns     Hyperlipidemia    Hypertension    Hypertensive heart disease    Kidney stones    Mixed hyperlipidemia    Nephrolithiasis 06/07/2015   Paroxysmal atrial fibrillation ([facility])    a. s/p Maze in 2005 b. recurrence in March 2016 with >8sec posttermination pauses s/p PPM implant     Pneumonia 1950   "double"   Presence of permanent cardiac pacemaker    Restrictive lung disease 03/03/2018   Sleep apnea    "tested; mask ordered; couldn't afford it; will get it now" (09/04/2015)   TIA (transient ischemic attack)    Type II diabetes mellitus ([facility])    Uterine cancer ([facility])    age 39 with partial hysterectomy    Tobacco History: Social History   Tobacco Use  Smoking Status Never  Smokeless Tobacco Never   Counseling given: Not Answered   Continue to not smoke  Outpatient Encounter Medications as of 12/18/2021  Medication Sig   acetaminophen (TYLENOL) 500 MG tablet Take 500-1,000 mg by mouth every 6 (six) hours as needed for moderate pain.   B Complex-Biotin-FA (B-COMPLEX PO) Take 1 capsule by mouth every morning.    BIOTIN 5000 PO Take 1 tablet by mouth daily.   Cholecalciferol (VITAMIN D3) 1000 UNITS CAPS Take 1,000 Units by mouth daily.   Coenzyme Q10 (CO Q-10) 200 MG CAPS Take 200 mg by mouth daily.   diphenhydramine-acetaminophen (TYLENOL PM) 25-500 MG TABS tablet Take 1 tablet by mouth at bedtime as  needed (pain/sleep).    ELIQUIS 5 MG TABS tablet TAKE ONE TABLET TWICE DAILY   FARXIGA 10 MG TABS tablet Take 10 mg by mouth daily.  fenofibrate 160 MG tablet TAKE 1 TABLET DAILY   HUMALOG MIX 75/25 KWIKPEN (75-25) 100 UNIT/ML Kwikpen Inject 34-38 Units into the skin in the morning, at noon, and at bedtime.   losartan (COZAAR) 50 MG tablet Take 50 mg by mouth daily. Take 1 Tablet Daily   metolazone (ZAROXOLYN) 2.5 MG tablet Take 2.5 mg by mouth daily as needed. Take 30 minutes prior to taking Lasix   metoprolol succinate (TOPROL-XL) 50 MG 24 hr tablet Take 50 mg by mouth daily. Take 1 Tablet Daily with or immediately following a meal.   potassium chloride (KLOR-CON) 10 MEQ tablet Take 20 mEq by mouth daily.   torsemide (DEMADEX) 20 MG tablet Take 1 tablet (20 mg total) by mouth 2 (two) times daily.   No facility-administered encounter medications on file as of 12/18/2021.     Review of Systems  Review of Systems  N/a Physical Exam  BP 122/62 (BP Location: Right Arm, Patient Position: Sitting, Cuff Size: Normal)   Pulse 70   Temp 98.4 F (36.9 C) (Oral)   Wt 229 lb (103.9 kg)   SpO2 98%   BMI 39.31 kg/m   Wt Readings from Last 5 Encounters:  12/18/21 229 lb (103.9 kg)  12/11/21 229 lb (103.9 kg)  10/21/21 231 lb (104.8 kg)  10/02/21 232 lb 9.6 oz (105.5 kg)  08/29/21 226 lb 12.8 oz (102.9 kg)    BMI Readings from Last 5 Encounters:  12/18/21 39.31 kg/m  12/11/21 39.31 kg/m  10/21/21 39.65 kg/m  10/02/21 39.93 kg/m  08/29/21 40.18 kg/m     Physical Exam General: Sitting in chair, no acute distress Eyes: EOMI, icterus Neck: Supple, no JVP Pulmonary: Faint crackles upper lung fields, otherwise clear, normal work of breathing Cardiovascular: Regular rate and rhythm, no murmur Bowel sounds present MSK: No synovitis, no joint effusion Neuro: Normal gait, no weakness Psych: Normal mood, full affect   Assessment & Plan:   Presumed pulmonary hypertension:  Suspect this is multifactorial.  Longstanding evidence of left-sided structural disease including mitral valve stenosis dating back to 2018, dilated left atrium and 2020, and ongoing dilated left atrium more recently 2023.  There is been RV dysfunction since at least 11/2018 including right atrial pressure elevation at that time.  Other contributors include group 2 disease given her interstitial lung disease on CT scan.  High concern for undiagnosed hypoxemia given significant interstitial changes.  Hypoxemia and ILD would be likely contributors and group 3.  Untreated sleep apnea likely contributing.  Encouraged to resume CPAP therapy.  No known history of VTE.  Low suspicion for Group 1 disease.  Overall, not good candidate for pulmonary vasodilators given left-sided disease as well as her inability to tolerate or use CPAP.   Can reconsider in the future, will require right heart catheterization if they decide to pursue pulmonary vasodilators.  Chronic hypoxemic respiratory failure: Worse over the last 3 weeks or so.  In the setting of weight gain, weight up to about 237, 10 pounds higher than more recent baseline.  Weight down 5 pounds of metolazone use with mild improvement in dyspnea exertion.  High suspicion for volume overload.  Chest x-ray, BNP today.  Also will check BMP given recent worsening renal function.  High suspicion for CHF.  Also possibility of worsening ILD.  Will check for inflammatory markers today.  Interstitial lung disease: On CT scan, worse 2023 compared to 2020.  HSP versus NSIP in my opinion.  Possibly related to prior amiodarone exposure.  Consider antifibrotic's versus immunosuppressants in the future.  Repeat CT scan 11/2021.  PFTs 2023 largely unchanged compared to 2020.  Notably PFTs in 2021 seem spurious with multiple values much higher than these test that preceded and performed after that test in 2021.  Inflammatory markers flat 08/2021.  Think biopsy would be helpful but given  her tenuous cardiovascular, pulmonary, and renal disease this is high risk and not recommended at this time.  She seems at peace at this time but we do not have really any options for further evaluation or treatment.  Will discuss at ILD conference.  Return in about 3 months (around 03/20/2022).   Lanier Clam, MD 12/18/2021   This appointment required 43 minutes of patient care (this includes precharting, chart review, review of results, face-to-face care, etc.).

## 2021-12-18 NOTE — Patient Instructions (Signed)
Nice to see you again  No changes to medications today  Your CT scan is stable compared to earlier this year  I will discuss the results of the CT scan amongst our group  Return to clinic in 3 months or sooner as needed with Dr. Silas Flood

## 2021-12-20 ENCOUNTER — Telehealth: Payer: Self-pay | Admitting: Pulmonary Disease

## 2021-12-20 DIAGNOSIS — J849 Interstitial pulmonary disease, unspecified: Secondary | ICD-10-CM | POA: Diagnosis not present

## 2021-12-20 MED ORDER — AMOXICILLIN-POT CLAVULANATE 875-125 MG PO TABS: 1.0000 | ORAL_TABLET | Freq: Two times a day (BID) | ORAL | 0 refills | Status: DC

## 2021-12-20 NOTE — Telephone Encounter (Signed)
Please advise on the Augmentin?  IT was not in the note.

## 2021-12-20 NOTE — Telephone Encounter (Signed)
Called and spoke with patient's daughter. She verbalized understanding. RX has been sent.   Nothing further needed at time of call.

## 2021-12-20 NOTE — Telephone Encounter (Signed)
Please send augmentin [phone] BID for 7 days - thanks

## 2021-12-22 DIAGNOSIS — J849 Interstitial pulmonary disease, unspecified: Secondary | ICD-10-CM | POA: Diagnosis not present

## 2021-12-24 DIAGNOSIS — Z1339 Encounter for screening examination for other mental health and behavioral disorders: Secondary | ICD-10-CM | POA: Diagnosis not present

## 2021-12-24 DIAGNOSIS — Z1331 Encounter for screening for depression: Secondary | ICD-10-CM | POA: Diagnosis not present

## 2021-12-24 DIAGNOSIS — I129 Hypertensive chronic kidney disease with stage 1 through stage 4 chronic kidney disease, or unspecified chronic kidney disease: Secondary | ICD-10-CM | POA: Diagnosis not present

## 2021-12-24 DIAGNOSIS — J849 Interstitial pulmonary disease, unspecified: Secondary | ICD-10-CM | POA: Diagnosis not present

## 2021-12-24 DIAGNOSIS — D6869 Other thrombophilia: Secondary | ICD-10-CM | POA: Diagnosis not present

## 2021-12-24 DIAGNOSIS — G720 Drug-induced myopathy: Secondary | ICD-10-CM | POA: Diagnosis not present

## 2021-12-24 DIAGNOSIS — Z794 Long term (current) use of insulin: Secondary | ICD-10-CM | POA: Diagnosis not present

## 2021-12-24 DIAGNOSIS — J9611 Chronic respiratory failure with hypoxia: Secondary | ICD-10-CM | POA: Diagnosis not present

## 2021-12-24 DIAGNOSIS — E1169 Type 2 diabetes mellitus with other specified complication: Secondary | ICD-10-CM | POA: Diagnosis not present

## 2021-12-24 DIAGNOSIS — I272 Pulmonary hypertension, unspecified: Secondary | ICD-10-CM | POA: Diagnosis not present

## 2021-12-24 DIAGNOSIS — N184 Chronic kidney disease, stage 4 (severe): Secondary | ICD-10-CM | POA: Diagnosis not present

## 2021-12-24 DIAGNOSIS — Z0000 Encounter for general adult medical examination without abnormal findings: Secondary | ICD-10-CM | POA: Diagnosis not present

## 2021-12-24 DIAGNOSIS — I5020 Unspecified systolic (congestive) heart failure: Secondary | ICD-10-CM | POA: Diagnosis not present

## 2021-12-30 NOTE — Progress Notes (Signed)
Remote pacemaker transmission.   

## 2021-12-31 DIAGNOSIS — E785 Hyperlipidemia, unspecified: Secondary | ICD-10-CM | POA: Diagnosis not present

## 2021-12-31 DIAGNOSIS — I10 Essential (primary) hypertension: Secondary | ICD-10-CM | POA: Diagnosis not present

## 2021-12-31 DIAGNOSIS — R7989 Other specified abnormal findings of blood chemistry: Secondary | ICD-10-CM | POA: Diagnosis not present

## 2022-01-06 NOTE — Progress Notes (Unsigned)
Cardiology Office Note:    Date:  01/08/2022   ID:  [name], DOB 02/14/1946, MRN [id number]  PCP:  Velna Hatchet, [place] Cardiologist: Shelva Majestic, MD   Reason for visit: 1 month follow-up for diastolic heart failure  History of Present Illness:    [name] is a 76 y.o. female with a hx of HTN, HLD, TIA, DM, Afib, CAD (CABG and MAZE 2005), post termination pauses >> PPM, ILD, OSA, mitral stenosis.  Hx of PPM, AV node ablation in January 2021 for uncontrolled atrial fibrillation.  With her CAD, she had back, shoulder and jaw pain.  ILD followed by pulm,  2 L/min nasal cannula as needed particularly with exertion.  PFTs stable over the last 2 years.    I last saw in July 2023.  Patient was found to have acute kidney injury with creatinine 1.67 up from baseline around 0.9.  At that time, she was taking Lasix 40 mg twice a day and metolazone 2.5 mg as needed and was a new start on Farxiga.  Her dry weight was 225 pounds.     With AKI, Farxiga discontinued.  Recommended minimizing metolazone use.  Cr improved to 1.47 on 10/18/21.  Patient saw pulmonary August 7, with weight up 10 pounds in O2 sats in the 80s, she had taken metolazone for 3 days.  CXR consistent with pulm edema.  Recheck creatinine 1.63.  I had a telephone visit (pt was sick with sinuses/cough) with the patient on December 11, 2021.  Patient felt that her weight was up 4 pounds and she noted puffy eyes.  She told me she takes metolazone every 2 to 3 days.  Given the need for regular metolazone use, I recommend switching her from Lasix 40 mg twice daily to torsemide 20 mg twice daily.  Patient later called back and felt that her ankle swelling was worse on torsemide, and she felt weak, dizzy and nauseous.  Patient was switched back to to Lasix.  Today, she comes in with her daughter and "adopted daughter."  They state the patient is back on Lasix 40 mg twice daily.  She had not had leg swelling x2  weeks until today.  She states her home weight was 229 yesterday and 229 again today.  Patient gets short of breath on minimal exertion and then will feel heart racing.  They state the lung doctor is at a loss for what else they can do for her.  They state patient cannot have a lung biopsy or dental work because they feel like she cannot tolerate anesthesia.   Patient's been on 2 L/min nasal cannula.  She sleeps with 2-3 pillows.  She denies PND.  Denies chest pain.  She also suffers from chronic back pain.  She has very limited mobility.  She states Dr. Ardeth Perfect has been checking her kidney labs every week and states her kidneys are working at 20%.  Patient has been avoiding taking metolazone.    Past Medical History:  Diagnosis Date   Asthmatic bronchitis    CHF (congestive heart failure) ([place]) dx'd 2016   Coronary atherosclerosis of native coronary artery    a. s/p CABG 2005 Dr Roxy Manns     Hyperlipidemia    Hypertension    Hypertensive heart disease    Kidney stones    Mixed hyperlipidemia    Nephrolithiasis 06/07/2015   Paroxysmal atrial fibrillation ([place])    a. s/p Maze in 2005 b. recurrence in March 2016  with >8sec posttermination pauses s/p PPM implant     Pneumonia 1950   "double"   Presence of permanent cardiac pacemaker    Restrictive lung disease 03/03/2018   Sleep apnea    "tested; mask ordered; couldn't afford it; will get it now" (09/04/2015)   TIA (transient ischemic attack)    Type II diabetes mellitus ([place])    Uterine cancer [facility])    age 5 with partial hysterectomy    Past Surgical History:  Procedure Laterality Date   AV NODE ABLATION N/A 03/28/2019   Procedure: AV NODE ABLATION;  Surgeon: Evans Lance, MD;  Location: [place] [facility];  Service: Cardiovascular;  Laterality: N/A;   CARDIAC CATHETERIZATION  2005   CORONARY ARTERY BYPASS GRAFT  2005   Dr Roxy Manns with MAZE   CYSTOSCOPY WITH URETEROSCOPY, STONE BASKETRY AND STENT PLACEMENT Left 09/08/2014    Procedure: CYSTOSCOPY WITH URETEROSCOPY, STONE BASKETRY AND STENT PLACEMENT;  Surgeon: Cleon Gustin, MD;  Location: [facility];  Service: Urology;  Laterality: Left;   CYSTOSCOPY/URETEROSCOPY/HOLMIUM LASER/STENT PLACEMENT Left 09/15/2014   Procedure: CYSTOSCOPY/RETROGRADE/URETEROSCOPY/HOLMIUM LASER/ STONE EXTRACTION /STENT EXCHANGE;  Surgeon: Cleon Gustin, MD;  Location: [facility];  Service: Urology;  Laterality: Left;   HOLMIUM LASER APPLICATION Left 7/82/4235   Procedure: HOLMIUM LASER APPLICATION;  Surgeon: Cleon Gustin, MD;  Location: [facility];  Service: Urology;  Laterality: Left;   HOLMIUM LASER APPLICATION Left 05/20/1441   Procedure: HOLMIUM LASER APPLICATION;  Surgeon: Cleon Gustin, MD;  Location: [facility];  Service: Urology;  Laterality: Left;   INSERT / REPLACE / REMOVE PACEMAKER     LAPAROSCOPIC CHOLECYSTECTOMY     PERMANENT PACEMAKER INSERTION N/A 06/05/2014   MDT Advisa 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   apixaban (ELIQUIS) 5 MG TABS tablet TAKE ONE TABLET TWICE DAILY   B Complex-Biotin-FA (B-COMPLEX PO) Take 1 capsule by mouth every morning.    BIOTIN 5000 PO Take 1 tablet by mouth daily.   Cholecalciferol (VITAMIN D3) 1000 UNITS CAPS Take 1,000 Units by mouth daily.   Coenzyme Q10 (CO Q-10) 200 MG CAPS Take 200 mg by mouth daily.   diphenhydramine-acetaminophen (TYLENOL PM) 25-500 MG TABS tablet Take 1 tablet by mouth at bedtime as needed (pain/sleep).    FARXIGA 10 MG TABS tablet Take 10 mg by mouth daily.   fenofibrate 160 MG tablet TAKE 1 TABLET DAILY   HUMALOG MIX 75/25 KWIKPEN (75-25) 100 UNIT/ML Kwikpen Inject 34-38 Units into the skin in the morning, at noon, and at bedtime.   losartan (COZAAR) 50 MG tablet Take 50 mg by mouth daily. Take 1  Tablet Daily   metolazone (ZAROXOLYN) 2.5 MG tablet Take 2.5 mg by mouth daily as needed. Take 30 minutes prior to taking Lasix   metoprolol succinate (TOPROL-XL) 50 MG 24 hr tablet Take 50 mg by mouth daily. Take 1 Tablet Daily with or immediately following a meal.   potassium chloride (KLOR-CON) 10 MEQ tablet Take 20 mEq by mouth daily.   [DISCONTINUED] torsemide (DEMADEX) 20 MG tablet Take 1 tablet (20 mg total) by mouth 2 (two) times daily.     Allergies:   Amiodarone, Gabapentin, and Torsemide   Social History   Socioeconomic History   Marital status: Married    Spouse name: Not  on file   Number of children: Not on file   Years of education: Not on file   Highest education level: Not on file  Occupational History   Not on file  Tobacco Use   Smoking status: Never   Smokeless tobacco: Never  Vaping Use   Vaping Use: Never used  Substance and Sexual Activity   Alcohol use: No   Drug use: No   Sexual activity: Not on file  Other Topics Concern   Not on file  Social History Narrative   Not on file   Social Determinants of Health   Financial Resource Strain: Low Risk  (12/09/2018)   Overall Financial Resource Strain (CARDIA)    Difficulty of Paying Living Expenses: Not hard at all  Food Insecurity: Unknown (12/09/2018)   Hunger Vital Sign    Worried About Running Out of Food in the Last Year: Patient refused    [place] in the Last Year: Patient refused  Transportation Needs: Unknown (12/09/2018)   PRAPARE - Hydrologist (Medical): Patient refused    Lack of Transportation (Non-Medical): Patient refused  Physical Activity: Not on file  Stress: Not on file  Social Connections: Not on file     Family History: The patient's family history includes Arthritis in her brother; Diabetes in her father; Heart attack in her maternal grandfather; Heart disease in her father and mother; Hyperlipidemia in her mother; Hypertension in her daughter,  mother, and sister; Stroke in her father and paternal grandfather.  ROS:   Please see the history of present illness.     EKGs/Labs/Other Studies Reviewed:    Recent Labs: 10/21/2021: BUN 39; Creatinine, Ser 1.63; Potassium 4.1; Pro B Natriuretic peptide (BNP) 114.0; Sodium 141   Recent Lipid Panel Lab Results  Component Value Date/Time   CHOL 188 03/19/2018 09:31 AM   TRIG 228 (H) 03/19/2018 09:31 AM   HDL 48 03/19/2018 09:31 AM   LDLCALC 94 03/19/2018 09:31 AM    Physical Exam:    VS:  BP 134/69 (BP Location: Left Arm, Patient Position: Sitting)   Pulse 86   Wt 232 lb 9.6 oz (105.5 kg)   SpO2 97%   BMI 39.93 kg/m    No data found.       Wt Readings from Last 3 Encounters:  01/08/22 232 lb 9.6 oz (105.5 kg)  12/18/21 229 lb (103.9 kg)  12/11/21 229 lb (103.9 kg)     GEN: Appears frail, wearing oxygen HEENT: Normal NECK: No JVD; No carotid bruits CARDIAC: Irregular regular RESPIRATORY:  Dry crackles at the bases ABDOMEN: Soft, non-tender, non-distended MUSCULOSKELETAL: 1+ LE edema below shins bilaterally SKIN: Slightly cool and dry NEUROLOGIC:  Alert and oriented PSYCHIATRIC:  Normal affect    ASSESSMENT AND PLAN   Chronic diastolic heart failure, mildly hypervolemic today  -2D echo March 2023 with EF 60 to 65%, mildly enlarged RV, moderately elevated PA pressures, mild to moderate mitral stenosis -Patient did not tolerate torsemide 47m BID - felt weak, headache and nausea -Continue Lasix 40 mg twice daily scheduled. -She is nervous about taking metolazone.  I told her, instead of metolazone, she can try taking extra Lasix 40 mg for for weight gain over 3 pounds in 1 day or 5 pounds in 1 week or worsening LE edema.   -With the complication of CKD, she has been referred to nephrology. -With heart failure, lung disease and kidney disease, she is harder to for general cardiology  to manage.  Will refer to our heart failure clinic.  CAD, no angina -Hx of CABG  2005 -No aspirin given need for anticoagulation.   Permanent atrial fibrillation -MAZE 2005 -AV node ablation Jan 2021 -Amiodarone stopped Oct 2019 2/2 concerns of pulmonary tox -Continue Toprol 50 mg daily. -Followed by EP; follows PPM interrogations.  Last interrogation: Permanent AF, rates controlled    Hypertension, well controlled -Continue losartan 50 mg once daily.   -I cannot see bloodwork from Dr. Ardeth Perfect.  Will defer to nephrology if patient is able to continue losartan with her CKD.    Mitral stenosis -2D echo in March 2023 with mild to moderate mitral stenosis -We will repeat echo to ensure this is not contributing to her dyspnea on exertion and fluid retention.   Disposition - Follow-up with CHF clinic referral and follow-up with Dr. Claiborne Billings in 6 months.   Medication Adjustments/Labs and Tests Ordered: Current medicines are reviewed at length with the patient today.  Concerns regarding medicines are outlined above.  Orders Placed This Encounter  Procedures   AMB referral to CHF clinic   ECHOCARDIOGRAM COMPLETE   No orders of the defined types were placed in this encounter.   Patient Instructions  Medication Instructions:  TAKE an extra dose of Lasix 40 mg if you experience increased shortness of breath, weight gain of 3 lbs in a day or 5 lbs in a week, and leg swelling instead of taking Metolazone.  *If you need a refill on your cardiac medications before your next appointment, please call your pharmacy*  Lab Work: NONE ordered at this time of appointment   If you have labs (blood work) drawn today and your tests are completely normal, you will receive your results only by: [place] (if you have MyChart) OR A paper copy in the mail If you have any lab test that is abnormal or we need to change your treatment, we will call you to review the results.  Testing/Procedures: You have been referred to CHF clinic   Caron Presume, PA-C has requested that  you have an echocardiogram. Echocardiography is a painless test that uses sound waves to create images of your heart. It provides your doctor with information about the size and shape of your heart and how well your heart's chambers and valves are working. This procedure takes approximately one hour. There are no restrictions for this procedure. Please do NOT wear cologne, perfume, aftershave, or lotions (deodorant is allowed). Please arrive 15 minutes prior to your appointment time.  Follow-Up: At [facility], you and your health needs are our priority.  As part of our continuing mission to provide you with exceptional heart care, we have created designated Provider Care Teams.  These Care Teams include your primary Cardiologist (physician) and Advanced Practice Providers (APPs -  Physician Assistants and Nurse Practitioners) who all work together to provide you with the care you need, when you need it.  Your next appointment:   6 month(s)  The format for your next appointment:   In Person  Provider:   Shelva Majestic, MD     Other Instructions   Important Information About Sugar         Signed, Gaston Islam  01/08/2022 12:46 PM    [place]

## 2022-01-07 ENCOUNTER — Other Ambulatory Visit: Payer: Self-pay | Admitting: Cardiovascular Disease

## 2022-01-07 DIAGNOSIS — I480 Paroxysmal atrial fibrillation: Secondary | ICD-10-CM

## 2022-01-07 NOTE — Telephone Encounter (Signed)
Eliquis 19m refill request received. Patient is 76years old, weight-103.9kg, Crea-1.63 on 10/21/2021, Diagnosis-Afib, and last seen by JCaron Presumeon 12/11/2021. Dose is appropriate based on dosing criteria. Will send in refill to requested pharmacy.

## 2022-01-08 ENCOUNTER — Ambulatory Visit: Payer: PPO | Attending: Physician Assistant | Admitting: Physician Assistant

## 2022-01-08 VITALS — BP 134/69 | HR 86 | Wt 232.6 lb

## 2022-01-08 DIAGNOSIS — I10 Essential (primary) hypertension: Secondary | ICD-10-CM

## 2022-01-08 DIAGNOSIS — I5032 Chronic diastolic (congestive) heart failure: Secondary | ICD-10-CM | POA: Diagnosis not present

## 2022-01-08 DIAGNOSIS — R0609 Other forms of dyspnea: Secondary | ICD-10-CM

## 2022-01-08 DIAGNOSIS — I050 Rheumatic mitral stenosis: Secondary | ICD-10-CM

## 2022-01-08 DIAGNOSIS — E785 Hyperlipidemia, unspecified: Secondary | ICD-10-CM | POA: Diagnosis not present

## 2022-01-08 DIAGNOSIS — I2510 Atherosclerotic heart disease of native coronary artery without angina pectoris: Secondary | ICD-10-CM

## 2022-01-08 DIAGNOSIS — R7989 Other specified abnormal findings of blood chemistry: Secondary | ICD-10-CM | POA: Diagnosis not present

## 2022-01-08 NOTE — Patient Instructions (Addendum)
Medication Instructions:  TAKE an extra dose of Lasix 40 mg if you experience increased shortness of breath, weight gain of 3 lbs in a day or 5 lbs in a week, and leg swelling instead of taking Metolazone.  *If you need a refill on your cardiac medications before your next appointment, please call your pharmacy*  Lab Work: NONE ordered at this time of appointment   If you have labs (blood work) drawn today and your tests are completely normal, you will receive your results only by: [place] (if you have MyChart) OR A paper copy in the mail If you have any lab test that is abnormal or we need to change your treatment, we will call you to review the results.  Testing/Procedures: You have been referred to CHF clinic   Caron Presume, PA-C has requested that you have an echocardiogram. Echocardiography is a painless test that uses sound waves to create images of your heart. It provides your doctor with information about the size and shape of your heart and how well your heart's chambers and valves are working. This procedure takes approximately one hour. There are no restrictions for this procedure. Please do NOT wear cologne, perfume, aftershave, or lotions (deodorant is allowed). Please arrive 15 minutes prior to your appointment time.  Follow-Up: At [facility], you and your health needs are our priority.  As part of our continuing mission to provide you with exceptional heart care, we have created designated Provider Care Teams.  These Care Teams include your primary Cardiologist (physician) and Advanced Practice Providers (APPs -  Physician Assistants and Nurse Practitioners) who all work together to provide you with the care you need, when you need it.  Your next appointment:   6 month(s)  The format for your next appointment:   In Person  Provider:   Shelva Majestic, MD     Other Instructions   Important Information About [place]

## 2022-01-16 DIAGNOSIS — I10 Essential (primary) hypertension: Secondary | ICD-10-CM | POA: Diagnosis not present

## 2022-01-16 DIAGNOSIS — E785 Hyperlipidemia, unspecified: Secondary | ICD-10-CM | POA: Diagnosis not present

## 2022-01-16 DIAGNOSIS — R7989 Other specified abnormal findings of blood chemistry: Secondary | ICD-10-CM | POA: Diagnosis not present

## 2022-01-20 DIAGNOSIS — J849 Interstitial pulmonary disease, unspecified: Secondary | ICD-10-CM | POA: Diagnosis not present

## 2022-01-21 ENCOUNTER — Other Ambulatory Visit (HOSPITAL_BASED_OUTPATIENT_CLINIC_OR_DEPARTMENT_OTHER): Payer: PPO

## 2022-01-22 DIAGNOSIS — J849 Interstitial pulmonary disease, unspecified: Secondary | ICD-10-CM | POA: Diagnosis not present

## 2022-01-28 DIAGNOSIS — I272 Pulmonary hypertension, unspecified: Secondary | ICD-10-CM | POA: Diagnosis not present

## 2022-01-28 DIAGNOSIS — I5020 Unspecified systolic (congestive) heart failure: Secondary | ICD-10-CM | POA: Diagnosis not present

## 2022-01-28 DIAGNOSIS — I129 Hypertensive chronic kidney disease with stage 1 through stage 4 chronic kidney disease, or unspecified chronic kidney disease: Secondary | ICD-10-CM | POA: Diagnosis not present

## 2022-01-28 DIAGNOSIS — Z794 Long term (current) use of insulin: Secondary | ICD-10-CM | POA: Diagnosis not present

## 2022-01-28 DIAGNOSIS — N184 Chronic kidney disease, stage 4 (severe): Secondary | ICD-10-CM | POA: Diagnosis not present

## 2022-01-28 DIAGNOSIS — E1169 Type 2 diabetes mellitus with other specified complication: Secondary | ICD-10-CM | POA: Diagnosis not present

## 2022-01-28 DIAGNOSIS — J849 Interstitial pulmonary disease, unspecified: Secondary | ICD-10-CM | POA: Diagnosis not present

## 2022-01-28 DIAGNOSIS — J069 Acute upper respiratory infection, unspecified: Secondary | ICD-10-CM | POA: Diagnosis not present

## 2022-01-28 DIAGNOSIS — I4891 Unspecified atrial fibrillation: Secondary | ICD-10-CM | POA: Diagnosis not present

## 2022-01-28 DIAGNOSIS — R051 Acute cough: Secondary | ICD-10-CM | POA: Diagnosis not present

## 2022-02-10 ENCOUNTER — Ambulatory Visit (HOSPITAL_COMMUNITY)
Admission: RE | Admit: 2022-02-10 | Discharge: 2022-02-10 | Disposition: A | Discharge status: Home / Self Care | Payer: PPO | Source: Ambulatory Visit | Attending: Cardiology | Admitting: Cardiology

## 2022-02-10 ENCOUNTER — Encounter (HOSPITAL_COMMUNITY): Payer: Self-pay | Admitting: Cardiology

## 2022-02-10 VITALS — BP 120/68 | HR 67 | Wt 224.2 lb

## 2022-02-10 DIAGNOSIS — N189 Chronic kidney disease, unspecified: Secondary | ICD-10-CM | POA: Insufficient documentation

## 2022-02-10 DIAGNOSIS — R0600 Dyspnea, unspecified: Secondary | ICD-10-CM | POA: Insufficient documentation

## 2022-02-10 DIAGNOSIS — I2510 Atherosclerotic heart disease of native coronary artery without angina pectoris: Secondary | ICD-10-CM | POA: Diagnosis not present

## 2022-02-10 DIAGNOSIS — R0602 Shortness of breath: Secondary | ICD-10-CM | POA: Insufficient documentation

## 2022-02-10 DIAGNOSIS — I5032 Chronic diastolic (congestive) heart failure: Secondary | ICD-10-CM

## 2022-02-10 DIAGNOSIS — R609 Edema, unspecified: Secondary | ICD-10-CM | POA: Insufficient documentation

## 2022-02-10 DIAGNOSIS — I25810 Atherosclerosis of coronary artery bypass graft(s) without angina pectoris: Secondary | ICD-10-CM | POA: Diagnosis not present

## 2022-02-10 DIAGNOSIS — I4821 Permanent atrial fibrillation: Secondary | ICD-10-CM

## 2022-02-10 DIAGNOSIS — I130 Hypertensive heart and chronic kidney disease with heart failure and stage 1 through stage 4 chronic kidney disease, or unspecified chronic kidney disease: Secondary | ICD-10-CM | POA: Insufficient documentation

## 2022-02-10 DIAGNOSIS — Z7984 Long term (current) use of oral hypoglycemic drugs: Secondary | ICD-10-CM | POA: Insufficient documentation

## 2022-02-10 DIAGNOSIS — E782 Mixed hyperlipidemia: Secondary | ICD-10-CM | POA: Insufficient documentation

## 2022-02-10 DIAGNOSIS — R262 Difficulty in walking, not elsewhere classified: Secondary | ICD-10-CM | POA: Insufficient documentation

## 2022-02-10 DIAGNOSIS — Z8616 Personal history of COVID-19: Secondary | ICD-10-CM | POA: Diagnosis not present

## 2022-02-10 DIAGNOSIS — J849 Interstitial pulmonary disease, unspecified: Secondary | ICD-10-CM | POA: Insufficient documentation

## 2022-02-10 DIAGNOSIS — R5383 Other fatigue: Secondary | ICD-10-CM | POA: Insufficient documentation

## 2022-02-10 DIAGNOSIS — Z9981 Dependence on supplemental oxygen: Secondary | ICD-10-CM | POA: Diagnosis not present

## 2022-02-10 DIAGNOSIS — I272 Pulmonary hypertension, unspecified: Secondary | ICD-10-CM | POA: Diagnosis not present

## 2022-02-10 DIAGNOSIS — Z951 Presence of aortocoronary bypass graft: Secondary | ICD-10-CM | POA: Diagnosis not present

## 2022-02-10 DIAGNOSIS — E1122 Type 2 diabetes mellitus with diabetic chronic kidney disease: Secondary | ICD-10-CM | POA: Diagnosis not present

## 2022-02-10 DIAGNOSIS — Z950 Presence of cardiac pacemaker: Secondary | ICD-10-CM | POA: Diagnosis not present

## 2022-02-10 LAB — CBC
HCT: 30.7 % — ABNORMAL LOW (ref 36.0–46.0)
Hemoglobin: 9.8 g/dL — ABNORMAL LOW (ref 12.0–15.0)
MCH: 28.1 pg (ref 26.0–34.0)
MCHC: 31.9 g/dL (ref 30.0–36.0)
MCV: 88 fL (ref 80.0–100.0)
Platelets: 350 10*3/uL (ref 150–400)
RBC: 3.49 MIL/uL — ABNORMAL LOW (ref 3.87–5.11)
RDW: 14.8 % (ref 11.5–15.5)
WBC: 10.1 10*3/uL (ref 4.0–10.5)
nRBC: 0 % (ref 0.0–0.2)

## 2022-02-10 LAB — BASIC METABOLIC PANEL
Anion gap: 10 (ref 5–15)
BUN: 22 mg/dL (ref 8–23)
CO2: 39 mmol/L — ABNORMAL HIGH (ref 22–32)
Calcium: 9.6 mg/dL (ref 8.9–10.3)
Chloride: 85 mmol/L — ABNORMAL LOW (ref 98–111)
Creatinine, Ser: 1.6 mg/dL — ABNORMAL HIGH (ref 0.44–1.00)
GFR, Estimated: 33 mL/min — ABNORMAL LOW (ref >60)
Glucose, Bld: 248 mg/dL — ABNORMAL HIGH (ref 70–99)
Potassium: 3.4 mmol/L — ABNORMAL LOW (ref 3.5–5.1)
Sodium: 134 mmol/L — ABNORMAL LOW (ref 135–145)

## 2022-02-10 LAB — BRAIN NATRIURETIC PEPTIDE: B Natriuretic Peptide: 145.3 pg/mL — ABNORMAL HIGH (ref 0.0–100.0)

## 2022-02-10 NOTE — Progress Notes (Signed)
ReDS Vest / Clip - 02/10/22 1100       ReDS Vest / Clip   Station Marker B    Ruler Value 38    ReDS Value Range High volume overload    ReDS Actual Value 49

## 2022-02-10 NOTE — Patient Instructions (Signed)
It was great to see you today! No medication changes are needed at this time.  Labs today We will only contact you if something comes back abnormal or we need to make some changes. Otherwise no news is good news!  Your physician recommends that you schedule a follow-up appointment in: 6 weeks with Dr Daniel Nones    [facility] CL[facility] [place] [id number] [place] [address] Dept: [phone] Loc: [place]  02/10/2022  You are scheduled for a Cardiac Catheterization on Wednesday, December 13 with Dr.  Glade Stanford .  1. Please arrive at the [facility] (Main Entrance A) at [facility]: [address] [place], [place] [address] at 11:00 AM (This time is two hours before your procedure to ensure your preparation). Free valet parking service is available.   Special note: Every effort is made to have your procedure done on time. Please understand that emergencies sometimes delay scheduled procedures.  2. Diet: Do not eat solid foods after midnight.  The patient may have clear liquids until 5am upon the day of the procedure.  3. Labs: pre procedure labs done 02/10/2022  4. Medication instructions in preparation for your procedure:   Contrast Allergy: No    Stop taking Eliquis (Apixiban) on Monday, December 11.  Stop taking, Metolazone (Zaroxolyn) Wednesday, December 13,  Take only 17 units of insulin the night before your procedure. Do not take any insulin on the day of the procedure.    On the morning of your procedure, take your Aspirin 81 mg and any morning medicines NOT listed above.  You may use sips of water.  5. Plan for one night stay--bring personal belongings. 6. Bring a current list of your medications and current insurance cards. 7. You MUST have a responsible person to drive you home. 8. Someone MUST be with you the first 24 hours after you arrive home  or your discharge will be delayed. 9. Please wear clothes that are easy to get on and off and wear slip-on shoes.  Thank you for allowing [place] to care for you!   -- [facility]    Do the following things EVERYDAY: Weigh yourself in the morning before breakfast. Write it down and keep it in a log. Take your medicines as prescribed Eat low salt foods--Limit salt (sodium) to 2000 mg per day.  Stay as active as you can everyday Limit all fluids for the day to less than 2 liters  At the [facility], you and your health needs are our priority. As part of our continuing mission to provide you with exceptional heart care, we have created designated Provider Care Teams. These Care Teams include your primary Cardiologist (physician) and Advanced Practice Providers (APPs- Physician Assistants and Nurse Practitioners) who all work together to provide you with the care you need, when you need it.   You may see any of the following providers on your designated Care Team at your next follow up: Dr Glori Bickers Dr Loralie Champagne Dr. Roxana Hires, NP Lyda Jester, [place] [facility] [place], [place] Forestine Na, NP Audry Riles, PharmD   Please be sure to bring in all your medications bottles to every appointment.   If you have any questions or concerns before your next appointment please send [place] a message through [place] or call our office at [phone].    TO LEAVE A MESSAGE  FOR THE NURSE SELECT OPTION 2, PLEASE LEAVE A MESSAGE INCLUDING: YOUR NAME DATE OF BIRTH CALL BACK NUMBER REASON FOR CALL**this is important as we prioritize the call backs  YOU WILL RECEIVE A CALL BACK THE SAME DAY AS LONG AS YOU CALL BEFORE 4:00 PM

## 2022-02-10 NOTE — Progress Notes (Signed)
ADVANCED HEART FAILURE CLINIC NOTE  Referring Physician: Velna Hatchet, MD  Primary Care: Velna Hatchet, MD Primary Cardiologist: Dr. Shelva Majestic  HPI: [name] is a 76 y.o. female with hypertension, hyperlipidemia, permanent atrial fibrillation status post AV nodal ablation with permanent pacemaker, CAD (CABG and maze in 2005) and interstitial lung disease on 2 L continuous home O2 presenting today for further evaluation of hypoxemia, renal failure and possible pulmonary hypertension. [name] has been followed by general cardiology and pulmonology for management of HFpEF and interstitial lung disease.  Over the past several months she has had significant difficulty with managing volume due to AKI followed by rapid weight gain with stopping diuretics and dyspnea on exertion.  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 At that point she could still perform ADLs without significant difficulty. Roughly 1 year ago she was diagnosed with COVID19 pneumonia. She reports a significant decline in functional status at that point with difficulty in performing ADLs due to dyspnea. She was started on continuous home O2 earlier this year. Over the past several weeks she reports being unable to walk >44f w/o dyspnea and is now using a wheelchair due to SOB and fatigue. No reported syncope, chest pain, angina, lightheadedness or recent illness.   Activity level/exercise tolerance:  NYHA IIIB Orthopnea:  Sleeps on 3 pillows Paroxysmal noctural dyspnea:  no Chest pain/pressure:  no Orthostatic lightheadedness:  infrequent Palpitations:  no Lower extremity edema:  yes Presyncope/syncope:  no Cough:  infrequent  Past Medical History:  Diagnosis Date   Asthmatic bronchitis    CHF (congestive heart failure) ([facility] dx'd 2016   Coronary atherosclerosis of native  coronary artery    a. s/p CABG 2005 Dr ORoxy Manns    Hyperlipidemia    Hypertension    Hypertensive heart disease    Kidney stones    Mixed hyperlipidemia    Nephrolithiasis 06/07/2015   Par[place]ysmal atrial fibrillation ([facility]    a. s/p Maze in 2005 b. recurrence in March 2016 with >8sec posttermination pauses s/p PPM implant     Pneumonia 1950   "double"   Presence of permanent cardiac pacemaker    Restrictive lung disease 03/03/2018   Sleep apnea    "tested; mask ordered; couldn't afford it; will get it now" (09/04/2015)   TIA (transient ischemic attack)    Type II diabetes mellitus ([facility]    Uterine cancer ([facility]    age [age]with partial hysterectomy    Current Outpatient Medications  Medication Sig Dispense Refill   acetaminophen (TYLENOL) 500 MG tablet Take 500-1,000 mg by mouth every 6 (six) hours as needed for moderate pain.     apixaban (ELIQUIS) 5 MG TABS tablet TAKE ONE TABLET TWICE DAILY 60 tablet 5   B Complex-Biotin-FA (B-COMPLEX PO) Take 1 capsule by mouth every morning.      BIOTIN 5000 PO Take 1 tablet by mouth daily.     Cholecalciferol (VITAMIN D3) 1000 UNITS CAPS Take 1,000 Units by mouth daily.     Coenzyme Q10 (CO Q-10) 200 MG CAPS Take 200 mg by mouth daily.     diphenhydramine-acetaminophen (TYLENOL PM) 25-500 MG TABS tablet Take 1 tablet by mouth at bedtime as needed (pain/sleep).      FARXIGA 10 MG TABS tablet Take 10 mg by mouth daily.     fenofibrate 160 MG tablet TAKE 1 TABLET DAILY 90  tablet 3   HUMALOG MIX 75/25 KWIKPEN (75-25) 100 UNIT/ML Kwikpen Inject 34-38 Units into the skin in the morning, at noon, and at bedtime.     losartan (COZAAR) 50 MG tablet Take 50 mg by mouth daily. Take 1 Tablet Daily     metolazone (ZAROXOLYN) 2.5 MG tablet Take 2.5 mg by mouth as needed.     metoprolol succinate (TOPROL-XL) 50 MG 24 hr tablet Take 50 mg by mouth daily. Take 1 Tablet Daily with or immediately following a meal.     potassium chloride (KLOR-CON) 10 MEQ tablet Take  20 mEq by mouth daily.     No current facility-administered medications for this encounter.    Allergies  Allergen Reactions   Amiodarone     Other reaction(s): pulmonary fibrosis   Gabapentin     Other reaction(s): edema   Torsemide Nausea Only    Headache      Social History   Socioeconomic History   Marital status: Married    Spouse name: Not on file   Number of children: Not on file   Years of education: Not on file   Highest education level: Not on file  Occupational History   Not on file  Tobacco Use   Smoking status: Never   Smokeless tobacco: Never  Vaping Use   Vaping Use: Never used  Substance and Sexual Activity   Alcohol use: No   Drug use: No   Sexual activity: Not on file  Other Topics Concern   Not on file  Social History Narrative   Not on file   Social Determinants of Health   Financial Resource Strain: Low Risk  (12/09/2018)   Overall Financial Resource Strain (CARDIA)    Difficulty of Paying Living Expenses: Not hard at all  Food Insecurity: Unknown (12/09/2018)   Hunger Vital Sign    Worried About Running Out of Food in the Last Year: Patient refused    Ran Out of Food in the Last Year: Patient refused  Transportation Needs: Unknown (12/09/2018)   PRAPARE - Hydrologist (Medical): Patient refused    Lack of Transportation (Non-Medical): Patient refused  Physical Activity: Not on file  Stress: Not on file  Social Connections: Not on file  Intimate Partner Violence: Unknown (12/09/2018)   Humiliation, Afraid, Rape, and Kick questionnaire    Fear of Current or Ex-Partner: Patient refused    Emotionally Abused: Patient refused    Physically Abused: Patient refused    Sexually Abused: Patient refused      Family History  Problem Relation Age of Onset   Hypertension Mother    Hyperlipidemia Mother    Heart disease Mother    Diabetes Father    Stroke Father    Heart disease Father    Hypertension Sister         3 sisters   Arthritis Brother    Heart attack Maternal Grandfather    Stroke Paternal Grandfather    Hypertension Daughter     PHYSICAL EXAM: Vitals:   02/10/22 1025  BP: 120/68  Pulse: 67  SpO2: 100%   GENERAL: chronically ill appearing, in wheelchair on 2L [place] O2 HEENT: Negative for arcus senilis or xanthelasma. There is no scleral icterus.  The mucous membranes are pink and moist.   NECK: Supple, No masses. Normal carotid upstrokes without bruits. No masses or thyromegaly.    CHEST: There are no chest wall deformities. There is no chest wall tenderness. Respirations are  unlabored.  Lungs- fine crackles at bases b/l CARDIAC:  JVP: difficult to visualize         Normal S1, S2  Normal rate with regular rhythm. No murmurs, rubs or gallops.  Pulses are 2+ and symmetrical in upper and lower extremities. 1+ edema.  ABDOMEN: Soft, non-tender, non-distended. There are no masses or hepatomegaly. There are normal bowel sounds.  EXTREMITIES: Warm and well perfused with no cyanosis, clubbing.  LYMPHATIC: No axillary or supraclavicular lymphadenopathy.  NEUROLOGIC: Patient is oriented x3 with no focal or lateralizing neurologic deficits.  PSYCH: Patients affect is appropriate, there is no evidence of anxiety or depression.  SKIN: Warm and dry; no lesions or wounds.   DATA REVIEW  [id number]:JAAZQW fibrillation w/ V paced rhythm  ECHO 06/10/21: LVEF 65%, RV mildly enlarged with RVSP 47 12/09/18: LVEF 55%, RV mildly enlarged with mildly reduced function.   ASSESSMENT & PLAN:  Pulmonary hypertension  - Followed by Dr. Silas Flood in pulmonology due to concern for hypersensitivity pneumonitis vs NSIP (potential sequale of prior amiodarone exposure). HRCT w/ significant air trapping, enlarged pulmonary artery consistent with IPF.  - Will plan for RHC, patient potentially a candidate for Tyvaso. I do not suspect she has Group 2 from mitral stenosis, gradients on prior echocardiograms are not  significantly elevated.   2. CKD  - ReDs elevated today; however, JVP does not appear significantly elevated. Minimal pitting edema. Due to difficulty with diuresis in the past will continue meds w/o changes until RHC.   3. CAD - s/p CABG in 2005 with MAZE  4. Permanent atrial fibrillation  - s/p AVN w/ PPM. LV function intact.     Maleia Weems Advanced Heart Failure Mechanical Circulatory Support

## 2022-02-10 NOTE — H&P (View-Only) (Signed)
ADVANCED HEART FAILURE CLINIC NOTE  Referring Physician: Velna Hatchet, MD  Primary Care: Velna Hatchet, MD Primary Cardiologist: Dr. Shelva Majestic  HPI: [name] is a 76 y.o. female with hypertension, hyperlipidemia, permanent atrial fibrillation status post AV nodal ablation with permanent pacemaker, CAD (CABG and maze in 2005) and interstitial lung disease on 2 L continuous home O2 presenting today for further evaluation of hypoxemia, renal failure and possible pulmonary hypertension. [name] has been followed by general cardiology and pulmonology for management of HFpEF and interstitial lung disease.  Over the past several months she has had significant difficulty with managing volume due to AKI followed by rapid weight gain with stopping diuretics and dyspnea on exertion.  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 At that point she could still perform ADLs without significant difficulty. Roughly 1 year ago she was diagnosed with COVID19 pneumonia. She reports a significant decline in functional status at that point with difficulty in performing ADLs due to dyspnea. She was started on continuous home O2 earlier this year. Over the past several weeks she reports being unable to walk >58f w/o dyspnea and is now using a wheelchair due to SOB and fatigue. No reported syncope, chest pain, angina, lightheadedness or recent illness.   Activity level/exercise tolerance:  NYHA IIIB Orthopnea:  Sleeps on 3 pillows Paroxysmal noctural dyspnea:  no Chest pain/pressure:  no Orthostatic lightheadedness:  infrequent Palpitations:  no Lower extremity edema:  yes Presyncope/syncope:  no Cough:  infrequent  Past Medical History:  Diagnosis Date   Asthmatic bronchitis    [place] ([place]estive heart [place] [place]   Coronary atherosclerosis of native  coronary artery    a. s/p CABG 2005 Dr ORoxy Manns    Hyperlipidemia    Hypertension    Hypertensive heart disease    Kidney stones    Mixed hyperlipidemia    Nephrolithiasis 06/07/2015   Paroxysmal atrial fibrillation ([facility]    a. s/p Maze in 2005 b. recurrence in March 2016 with >8sec posttermination pauses s/p PPM implant     Pneumonia 1950   "double"   Presence of permanent cardiac pacemaker    Restrictive lung disease 03/03/2018   Sleep apnea    "tested; mask ordered; couldn't afford it; will get it now" (09/04/2015)   TIA (transient ischemic attack)    Type II diabetes mellitus ([facility]    Uterine cancer ([facility]    age [id number] partial hysterectomy    Current Outpatient Medications  Medication Sig Dispense Refill   acetaminophen (TYLENOL) 500 MG tablet Take 500-1,000 mg by mouth every 6 (six) hours as needed for moderate pain.     apixaban (ELIQUIS) 5 MG TABS tablet TAKE ONE TABLET TWICE DAILY 60 tablet 5   B Complex-Biotin-FA (B-COMPLEX PO) Take 1 capsule by mouth every morning.      BIOTIN 5000 PO Take 1 tablet by mouth daily.     Cholecalciferol (VITAMIN D3) 1000 UNITS CAPS Take 1,000 Units by mouth daily.     Coenzyme Q10 (CO Q-10) 200 MG CAPS Take 200 mg by mouth daily.     diphenhydramine-acetaminophen (TYLENOL PM) 25-500 MG TABS tablet Take 1 tablet by mouth at bedtime as needed (pain/sleep).      FARXIGA 10 MG TABS tablet Take 10 mg by mouth daily.     fenofibrate 160 MG tablet TAKE 1 TABLET DAILY 90  tablet 3   HUMALOG MIX 75/25 KWIKPEN (75-25) 100 UNIT/ML Kwikpen Inject 34-38 Units into the skin in the morning, at noon, and at bedtime.     losartan (COZAAR) 50 MG tablet Take 50 mg by mouth daily. Take 1 Tablet Daily     metolazone (ZAROXOLYN) 2.5 MG tablet Take 2.5 mg by mouth as needed.     metoprolol succinate (TOPROL-XL) 50 MG 24 hr tablet Take 50 mg by mouth daily. Take 1 Tablet Daily with or immediately following a meal.     potassium chloride (KLOR-CON) 10 MEQ tablet Take  20 mEq by mouth daily.     No current facility-administered medications for this encounter.    Allergies  Allergen Reactions   Amiodarone     Other reaction(s): pulmonary fibrosis   Gabapentin     Other reaction(s): edema   Torsemide Nausea Only    Headache      Social History   Socioeconomic History   Marital status: Married    Spouse name: Not on file   Number of children: Not on file   Years of education: Not on file   Highest education level: Not on file  Occupational History   Not on file  Tobacco Use   Smoking status: Never   Smokeless tobacco: Never  Vaping Use   Vaping Use: Never used  Substance and Sexual Activity   Alcohol use: No   Drug use: No   Sexual activity: Not on file  Other Topics Concern   Not on file  Social History Narrative   Not on file   Social Determinants of Health   Financial Resource Strain: Low Risk  (12/09/2018)   Overall Financial Resource Strain (CARDIA)    Difficulty of Paying Living Expenses: Not hard at all  Food Insecurity: Unknown (12/09/2018)   Hunger Vital Sign    Worried About Running Out of Food in the Last Year: Patient refused    Ran Out of Food in the Last Year: Patient refused  Transportation Needs: Unknown (12/09/2018)   PRAPARE - Hydrologist (Medical): Patient refused    Lack of Transportation (Non-Medical): Patient refused  Physical Activity: Not on file  Stress: Not on file  Social Connections: Not on file  Intimate Partner Violence: Unknown (12/09/2018)   Humiliation, Afraid, Rape, and Kick questionnaire    Fear of Current or Ex-Partner: Patient refused    Emotionally Abused: Patient refused    Physically Abused: Patient refused    Sexually Abused: Patient refused      Family History  Problem Relation Age of Onset   Hypertension Mother    Hyperlipidemia Mother    Heart disease Mother    Diabetes Father    Stroke Father    Heart disease Father    Hypertension Sister         3 sisters   Arthritis Brother    Heart attack Maternal Grandfather    Stroke Paternal Grandfather    Hypertension Daughter     PHYSICAL EXAM: Vitals:   02/10/22 1025  BP: 120/68  Pulse: 67  SpO2: 100%   GENERAL: chronically ill appearing, in wheelchair on 2L [place] O2 HEENT: Negative for arcus senilis or xanthelasma. There is no scleral icterus.  The mucous membranes are pink and moist.   NECK: Supple, No masses. Normal carotid upstrokes without bruits. No masses or thyromegaly.    CHEST: There are no chest wall deformities. There is no chest wall tenderness. Respirations are  unlabored.  Lungs- fine crackles at bases b/l CARDIAC:  JVP: difficult to visualize         Normal S1, S2  Normal rate with regular rhythm. No murmurs, rubs or gallops.  Pulses are 2+ and symmetrical in upper and lower extremities. 1+ edema.  ABDOMEN: Soft, non-tender, non-distended. There are no masses or hepatomegaly. There are normal bowel sounds.  EXTREMITIES: Warm and well perfused with no cyanosis, clubbing.  LYMPHATIC: No axillary or supraclavicular lymphadenopathy.  NEUROLOGIC: Patient is oriented x3 with no focal or lateralizing neurologic deficits.  PSYCH: Patients affect is appropriate, there is no evidence of anxiety or depression.  SKIN: Warm and dry; no lesions or wounds.   DATA REVIEW  KAJ:JAAZQW fibrillation w/ V paced rhythm  ECHO 06/10/21: LVEF 65%, RV mildly enlarged with RVSP 47 12/09/18: LVEF 55%, RV mildly enlarged with mildly reduced function.   ASSESSMENT & PLAN:  Pulmonary hypertension  - Followed by Dr. Silas Flood in pulmonology due to concern for hypersensitivity pneumonitis vs NSIP (potential sequale of prior amiodarone exposure). HRCT w/ significant air trapping, enlarged pulmonary artery consistent with IPF.  - Will plan for RHC, patient potentially a candidate for Tyvaso. I do not suspect she has Group 2 from mitral stenosis, gradients on prior echocardiograms are not  significantly elevated.   2. CKD  - ReDs elevated today; however, JVP does not appear significantly elevated. Minimal pitting edema. Due to difficulty with diuresis in the past will continue meds w/o changes until RHC.   3. CAD - s/p CABG in 2005 with MAZE  4. Permanent atrial fibrillation  - s/p AVN w/ PPM. LV function intact.     Ieshia Hatcher Advanced Heart Failure Mechanical Circulatory Support

## 2022-02-19 DIAGNOSIS — J849 Interstitial pulmonary disease, unspecified: Secondary | ICD-10-CM | POA: Diagnosis not present

## 2022-02-21 DIAGNOSIS — J849 Interstitial pulmonary disease, unspecified: Secondary | ICD-10-CM | POA: Diagnosis not present

## 2022-02-26 ENCOUNTER — Ambulatory Visit (HOSPITAL_BASED_OUTPATIENT_CLINIC_OR_DEPARTMENT_OTHER): Payer: PPO

## 2022-02-26 ENCOUNTER — Encounter (HOSPITAL_COMMUNITY): Payer: Self-pay | Admitting: Cardiology

## 2022-02-26 ENCOUNTER — Encounter (HOSPITAL_COMMUNITY)
Admission: RE | Disposition: A | Discharge status: Home / Self Care | Payer: Self-pay | Source: Ambulatory Visit | Attending: Cardiology

## 2022-02-26 ENCOUNTER — Ambulatory Visit (HOSPITAL_COMMUNITY)
Admission: RE | Admit: 2022-02-26 | Discharge: 2022-02-26 | Disposition: A | Discharge status: Home / Self Care | Payer: PPO | Source: Ambulatory Visit | Attending: Cardiology | Admitting: Cardiology

## 2022-02-26 DIAGNOSIS — Z951 Presence of aortocoronary bypass graft: Secondary | ICD-10-CM | POA: Diagnosis not present

## 2022-02-26 DIAGNOSIS — N189 Chronic kidney disease, unspecified: Secondary | ICD-10-CM | POA: Insufficient documentation

## 2022-02-26 DIAGNOSIS — Z950 Presence of cardiac pacemaker: Secondary | ICD-10-CM | POA: Insufficient documentation

## 2022-02-26 DIAGNOSIS — Z794 Long term (current) use of insulin: Secondary | ICD-10-CM | POA: Insufficient documentation

## 2022-02-26 DIAGNOSIS — J849 Interstitial pulmonary disease, unspecified: Secondary | ICD-10-CM | POA: Diagnosis not present

## 2022-02-26 DIAGNOSIS — I342 Nonrheumatic mitral (valve) stenosis: Secondary | ICD-10-CM | POA: Diagnosis not present

## 2022-02-26 DIAGNOSIS — E785 Hyperlipidemia, unspecified: Secondary | ICD-10-CM | POA: Insufficient documentation

## 2022-02-26 DIAGNOSIS — I5032 Chronic diastolic (congestive) heart failure: Secondary | ICD-10-CM | POA: Insufficient documentation

## 2022-02-26 DIAGNOSIS — I4821 Permanent atrial fibrillation: Secondary | ICD-10-CM | POA: Diagnosis not present

## 2022-02-26 DIAGNOSIS — Z7984 Long term (current) use of oral hypoglycemic drugs: Secondary | ICD-10-CM | POA: Insufficient documentation

## 2022-02-26 DIAGNOSIS — I2510 Atherosclerotic heart disease of native coronary artery without angina pectoris: Secondary | ICD-10-CM | POA: Diagnosis not present

## 2022-02-26 DIAGNOSIS — I509 Heart failure, unspecified: Secondary | ICD-10-CM

## 2022-02-26 DIAGNOSIS — Z9981 Dependence on supplemental oxygen: Secondary | ICD-10-CM | POA: Insufficient documentation

## 2022-02-26 DIAGNOSIS — I130 Hypertensive heart and chronic kidney disease with heart failure and stage 1 through stage 4 chronic kidney disease, or unspecified chronic kidney disease: Secondary | ICD-10-CM | POA: Diagnosis not present

## 2022-02-26 DIAGNOSIS — E1122 Type 2 diabetes mellitus with diabetic chronic kidney disease: Secondary | ICD-10-CM | POA: Diagnosis not present

## 2022-02-26 DIAGNOSIS — I272 Pulmonary hypertension, unspecified: Secondary | ICD-10-CM | POA: Diagnosis not present

## 2022-02-26 HISTORY — PX: RIGHT HEART CATH: CATH118263

## 2022-02-26 LAB — POCT I-STAT EG7
Acid-Base Excess: 13 mmol/L — ABNORMAL HIGH (ref 0.0–2.0)
Acid-Base Excess: 13 mmol/L — ABNORMAL HIGH (ref 0.0–2.0)
Acid-Base Excess: 14 mmol/L — ABNORMAL HIGH (ref 0.0–2.0)
Bicarbonate: 36.2 mmol/L — ABNORMAL HIGH (ref 20.0–28.0)
Bicarbonate: 39.3 mmol/L — ABNORMAL HIGH (ref 20.0–28.0)
Bicarbonate: 39.3 mmol/L — ABNORMAL HIGH (ref 20.0–28.0)
Calcium, Ion: 1.14 mmol/L — ABNORMAL LOW (ref 1.15–1.40)
Calcium, Ion: 1.2 mmol/L (ref 1.15–1.40)
Calcium, Ion: 1.2 mmol/L (ref 1.15–1.40)
HCT: 31 % — ABNORMAL LOW (ref 36.0–46.0)
HCT: 32 % — ABNORMAL LOW (ref 36.0–46.0)
HCT: 33 % — ABNORMAL LOW (ref 36.0–46.0)
Hemoglobin: 10.5 g/dL — ABNORMAL LOW (ref 12.0–15.0)
Hemoglobin: 10.9 g/dL — ABNORMAL LOW (ref 12.0–15.0)
Hemoglobin: 11.2 g/dL — ABNORMAL LOW (ref 12.0–15.0)
O2 Saturation: 66 %
O2 Saturation: 69 %
O2 Saturation: 97 %
Potassium: 3.2 mmol/L — ABNORMAL LOW (ref 3.5–5.1)
Potassium: 3.3 mmol/L — ABNORMAL LOW (ref 3.5–5.1)
Potassium: 3.3 mmol/L — ABNORMAL LOW (ref 3.5–5.1)
Sodium: 136 mmol/L (ref 135–145)
Sodium: 137 mmol/L (ref 135–145)
Sodium: 137 mmol/L (ref 135–145)
TCO2: 37 mmol/L — ABNORMAL HIGH (ref 22–32)
TCO2: 41 mmol/L — ABNORMAL HIGH (ref 22–32)
TCO2: 41 mmol/L — ABNORMAL HIGH (ref 22–32)
pCO2, Ven: 34.6 mmHg — ABNORMAL LOW (ref 44–60)
pCO2, Ven: 57.2 mmHg (ref 44–60)
pCO2, Ven: 57.5 mmHg (ref 44–60)
pH, Ven: 7.443 — ABNORMAL HIGH (ref 7.25–7.43)
pH, Ven: 7.445 — ABNORMAL HIGH (ref 7.25–7.43)
pH, Ven: 7.627 (ref 7.25–7.43)
pO2, Ven: 34 mmHg (ref 32–45)
pO2, Ven: 36 mmHg (ref 32–45)
pO2, Ven: 73 mmHg — ABNORMAL HIGH (ref 32–45)

## 2022-02-26 LAB — ECHOCARDIOGRAM LIMITED
AV Mean grad: 7 mmHg
AV Peak grad: 14 mmHg
Ao pk vel: 1.87 m/s
Area-P 1/2: 2.62 cm2
Height: 61 in
S' Lateral: 3.3 cm
Weight: 3504 oz

## 2022-02-26 LAB — BASIC METABOLIC PANEL
Anion gap: 12 (ref 5–15)
BUN: 38 mg/dL — ABNORMAL HIGH (ref 8–23)
CO2: 37 mmol/L — ABNORMAL HIGH (ref 22–32)
Calcium: 9.6 mg/dL (ref 8.9–10.3)
Chloride: 88 mmol/L — ABNORMAL LOW (ref 98–111)
Creatinine, Ser: 1.59 mg/dL — ABNORMAL HIGH (ref 0.44–1.00)
GFR, Estimated: 34 mL/min — ABNORMAL LOW (ref >60)
Glucose, Bld: 159 mg/dL — ABNORMAL HIGH (ref 70–99)
Potassium: 3.1 mmol/L — ABNORMAL LOW (ref 3.5–5.1)
Sodium: 137 mmol/L (ref 135–145)

## 2022-02-26 LAB — GLUCOSE, CAPILLARY: Glucose-Capillary: 155 mg/dL — ABNORMAL HIGH (ref 70–99)

## 2022-02-26 SURGERY — RIGHT HEART CATH
Anesthesia: LOCAL

## 2022-02-26 MED ORDER — HEPARIN (PORCINE) IN NACL 1000-0.9 UT/500ML-% IV SOLN
INTRAVENOUS | Status: DC | PRN
  Administered 2022-02-26: 500 mL

## 2022-02-26 MED ORDER — LABETALOL HCL 5 MG/ML IV SOLN: 10.0000 mg | INTRAVENOUS | Status: DC | PRN

## 2022-02-26 MED ORDER — LIDOCAINE HCL (PF) 1 % IJ SOLN
INTRAMUSCULAR | Status: AC
  Filled 2022-02-26: qty 30

## 2022-02-26 MED ORDER — SODIUM CHLORIDE 0.9% FLUSH: 3.0000 mL | Freq: Two times a day (BID) | INTRAVENOUS | Status: DC

## 2022-02-26 MED ORDER — SODIUM CHLORIDE 0.9% FLUSH: 3.0000 mL | INTRAVENOUS | Status: DC | PRN

## 2022-02-26 MED ORDER — LIDOCAINE HCL (PF) 1 % IJ SOLN
INTRAMUSCULAR | Status: DC | PRN
  Administered 2022-02-26: 2 mL

## 2022-02-26 MED ORDER — SODIUM CHLORIDE 0.9 % IV SOLN: 250.0000 mL | INTRAVENOUS | Status: DC | PRN

## 2022-02-26 MED ORDER — ACETAMINOPHEN 325 MG PO TABS: 650.0000 mg | ORAL_TABLET | ORAL | Status: DC | PRN

## 2022-02-26 MED ORDER — SODIUM CHLORIDE 0.9 % IV SOLN: INTRAVENOUS | Status: DC

## 2022-02-26 MED ORDER — ONDANSETRON HCL 4 MG/2ML IJ SOLN: 4.0000 mg | Freq: Four times a day (QID) | INTRAMUSCULAR | Status: DC | PRN

## 2022-02-26 MED ORDER — PERFLUTREN LIPID MICROSPHERE
1.0000 mL | INTRAVENOUS | Status: DC | PRN
  Administered 2022-02-26: 2 mL via INTRAVENOUS

## 2022-02-26 MED ORDER — HYDRALAZINE HCL 20 MG/ML IJ SOLN: 10.0000 mg | INTRAMUSCULAR | Status: DC | PRN

## 2022-02-26 MED ORDER — MIDAZOLAM HCL 2 MG/2ML IJ SOLN
INTRAMUSCULAR | Status: AC
  Filled 2022-02-26: qty 2

## 2022-02-26 SURGICAL SUPPLY — 5 items
CATH SWAN GANZ 7F STRAIGHT (CATHETERS) IMPLANT
GLIDESHEATH SLENDER 7FR .021G (SHEATH) IMPLANT
PACK CARDIAC CATHETERIZATION (CUSTOM PROCEDURE TRAY) ×1 IMPLANT
TRANSDUCER W/STOPCOCK (MISCELLANEOUS) ×1 IMPLANT
WIRE EMERALD 3MM-J .025X260CM (WIRE) IMPLANT

## 2022-02-26 NOTE — Progress Notes (Signed)
Report to unit taken by nurse Cardell Peach, RN and given to me

## 2022-02-26 NOTE — Progress Notes (Signed)
  Echocardiogram 2D Echocardiogram has been performed.  [name] 02/26/2022, 3:11 PM

## 2022-02-26 NOTE — Interval H&P Note (Signed)
History and Physical Interval Note:  02/26/2022 12:10 PM  [name]  has presented today for surgery, with the diagnosis of chf.  The various methods of treatment have been discussed with the patient and family. After consideration of risks, benefits and other options for treatment, the patient has consented to  Procedure(s): RIGHT HEART CATH (N/A) as a surgical intervention.  The patient's history has been reviewed, patient examined, no change in status, stable for surgery.  I have reviewed the patient's chart and labs.  Questions were answered to the patient's satisfaction.     Penny Frisbie

## 2022-03-18 ENCOUNTER — Ambulatory Visit (INDEPENDENT_AMBULATORY_CARE_PROVIDER_SITE_OTHER): Payer: Medicare Other

## 2022-03-18 DIAGNOSIS — I5032 Chronic diastolic (congestive) heart failure: Secondary | ICD-10-CM

## 2022-03-20 LAB — CUP PACEART REMOTE DEVICE CHECK
Battery Remaining Longevity: 13 mo
Battery Voltage: 2.9 V
Brady Statistic RA Percent Paced: 0 %
Brady Statistic RV Percent Paced: 97.62 %
Date Time Interrogation Session: 20240103170027
Implantable Lead Connection Status: 753985
Implantable Lead Connection Status: 753985
Implantable Lead Implant Date: 20160321
Implantable Lead Implant Date: 20160321
Implantable Lead Location: 753859
Implantable Lead Location: 753860
Implantable Lead Model: 5076
Implantable Lead Model: 5076
Implantable Pulse Generator Implant Date: 20160321
Lead Channel Impedance Value: 399 Ohm
Lead Channel Impedance Value: 494 Ohm
Lead Channel Impedance Value: 551 Ohm
Lead Channel Impedance Value: 570 Ohm
Lead Channel Pacing Threshold Amplitude: 0.5 V
Lead Channel Pacing Threshold Amplitude: 0.875 V
Lead Channel Pacing Threshold Pulse Width: 0.4 ms
Lead Channel Pacing Threshold Pulse Width: 0.4 ms
Lead Channel Sensing Intrinsic Amplitude: 0.375 mV
Lead Channel Sensing Intrinsic Amplitude: 0.375 mV
Lead Channel Sensing Intrinsic Amplitude: 19.75 mV
Lead Channel Sensing Intrinsic Amplitude: 19.75 mV
Lead Channel Setting Pacing Amplitude: 2.5 V
Lead Channel Setting Pacing Pulse Width: 0.4 ms
Lead Channel Setting Sensing Sensitivity: 4 mV
Zone Setting Status: 755011
Zone Setting Status: 755011

## 2022-03-24 ENCOUNTER — Other Ambulatory Visit (HOSPITAL_COMMUNITY): Payer: Self-pay

## 2022-03-24 ENCOUNTER — Telehealth (HOSPITAL_COMMUNITY): Payer: Self-pay

## 2022-03-24 ENCOUNTER — Encounter (HOSPITAL_COMMUNITY): Payer: Self-pay | Admitting: Cardiology

## 2022-03-24 ENCOUNTER — Ambulatory Visit (HOSPITAL_COMMUNITY)
Admission: RE | Admit: 2022-03-24 | Discharge: 2022-03-24 | Disposition: A | Discharge status: Home / Self Care | Payer: Medicare Other | Source: Ambulatory Visit | Attending: Cardiology | Admitting: Cardiology

## 2022-03-24 VITALS — BP 122/60 | HR 69 | Wt 227.4 lb

## 2022-03-24 DIAGNOSIS — Z951 Presence of aortocoronary bypass graft: Secondary | ICD-10-CM | POA: Insufficient documentation

## 2022-03-24 DIAGNOSIS — Z8249 Family history of ischemic heart disease and other diseases of the circulatory system: Secondary | ICD-10-CM | POA: Insufficient documentation

## 2022-03-24 DIAGNOSIS — I5032 Chronic diastolic (congestive) heart failure: Secondary | ICD-10-CM

## 2022-03-24 DIAGNOSIS — Z9981 Dependence on supplemental oxygen: Secondary | ICD-10-CM | POA: Diagnosis not present

## 2022-03-24 DIAGNOSIS — E785 Hyperlipidemia, unspecified: Secondary | ICD-10-CM | POA: Insufficient documentation

## 2022-03-24 DIAGNOSIS — I4821 Permanent atrial fibrillation: Secondary | ICD-10-CM | POA: Diagnosis not present

## 2022-03-24 DIAGNOSIS — Z79899 Other long term (current) drug therapy: Secondary | ICD-10-CM | POA: Diagnosis not present

## 2022-03-24 DIAGNOSIS — N189 Chronic kidney disease, unspecified: Secondary | ICD-10-CM | POA: Insufficient documentation

## 2022-03-24 DIAGNOSIS — Z8616 Personal history of COVID-19: Secondary | ICD-10-CM | POA: Diagnosis not present

## 2022-03-24 DIAGNOSIS — R600 Localized edema: Secondary | ICD-10-CM | POA: Diagnosis not present

## 2022-03-24 DIAGNOSIS — I2510 Atherosclerotic heart disease of native coronary artery without angina pectoris: Secondary | ICD-10-CM | POA: Insufficient documentation

## 2022-03-24 DIAGNOSIS — I272 Pulmonary hypertension, unspecified: Secondary | ICD-10-CM

## 2022-03-24 DIAGNOSIS — I130 Hypertensive heart and chronic kidney disease with heart failure and stage 1 through stage 4 chronic kidney disease, or unspecified chronic kidney disease: Secondary | ICD-10-CM | POA: Diagnosis not present

## 2022-03-24 DIAGNOSIS — J849 Interstitial pulmonary disease, unspecified: Secondary | ICD-10-CM | POA: Insufficient documentation

## 2022-03-24 DIAGNOSIS — Z8349 Family history of other endocrine, nutritional and metabolic diseases: Secondary | ICD-10-CM | POA: Diagnosis not present

## 2022-03-24 LAB — BASIC METABOLIC PANEL
Anion gap: 12 (ref 5–15)
BUN: 49 mg/dL — ABNORMAL HIGH (ref 8–23)
CO2: 34 mmol/L — ABNORMAL HIGH (ref 22–32)
Calcium: 9.4 mg/dL (ref 8.9–10.3)
Chloride: 87 mmol/L — ABNORMAL LOW (ref 98–111)
Creatinine, Ser: 1.93 mg/dL — ABNORMAL HIGH (ref 0.44–1.00)
GFR, Estimated: 27 mL/min — ABNORMAL LOW (ref >60)
Glucose, Bld: 196 mg/dL — ABNORMAL HIGH (ref 70–99)
Potassium: 3.7 mmol/L (ref 3.5–5.1)
Sodium: 133 mmol/L — ABNORMAL LOW (ref 135–145)

## 2022-03-24 LAB — BRAIN NATRIURETIC PEPTIDE: B Natriuretic Peptide: 75.8 pg/mL (ref 0.0–100.0)

## 2022-03-24 MED ORDER — SPIRONOLACTONE 25 MG PO TABS: 12.5000 mg | ORAL_TABLET | Freq: Every day | ORAL | 3 refills | Status: DC

## 2022-03-24 NOTE — Telephone Encounter (Signed)
Patient aware and agreeable. Labs ordered and scheduled.

## 2022-03-24 NOTE — Patient Instructions (Signed)
START Spironolactone 12.5 mg ( 1/2 Tab ) daily.  Labs done today, your results will be available in MyChart, we will contact you for abnormal readings.  Your provider has order a scan of your lungs. ONCE APPROVED BY YOUR INSURANCE COMPANY YOU WILL BE CALLED TO HAVE THE TEST ARRANGED.  Your physician recommends that you schedule a follow-up appointment in: 6 months ( July 2024)  ** please call the office in May in to arrange your follow up appointment **  If you have any questions or concerns before your next appointment please send [place] a message through [place] or call our office at [phone].    TO LEAVE A MESSAGE FOR THE NURSE SELECT OPTION 2, PLEASE LEAVE A MESSAGE INCLUDING: YOUR NAME DATE OF BIRTH CALL BACK NUMBER REASON FOR CALL**this is important as we prioritize the call backs  YOU WILL RECEIVE A CALL BACK THE SAME DAY AS LONG AS YOU CALL BEFORE 4:00 PM  At the [facility], you and your health needs are our priority. As part of our continuing mission to provide you with exceptional heart care, we have created designated Provider Care Teams. These Care Teams include your primary Cardiologist (physician) and Advanced Practice Providers (APPs- Physician Assistants and Nurse Practitioners) who all work together to provide you with the care you need, when you need it.   You may see any of the following providers on your designated Care Team at your next follow up: Dr Glori Bickers Dr Loralie Champagne Dr. Roxana Hires, NP Lyda Jester, [place] [facility] [place], [place] Forestine Na, NP Audry Riles, PharmD   Please be sure to bring in all your medications bottles to every appointment.

## 2022-03-24 NOTE — Progress Notes (Signed)
ADVANCED HEART FAILURE CLINIC NOTE  Referring Physician: Velna Hatchet, MD  Primary Care: Velna Hatchet, MD Primary Cardiologist: Dr. Shelva Majestic  HPI: [name] is a 77 y.o. female with hypertension, hyperlipidemia, permanent atrial fibrillation status post AV nodal ablation with permanent pacemaker, CAD (CABG and maze in 2005) and interstitial lung disease on 2 L continuous home O2 presenting today for further evaluation of hypoxemia, renal failure and possible pulmonary hypertension. [name] has been followed by general cardiology and pulmonology for management of HFpEF and interstitial lung disease.  Over the past several months she has had significant difficulty with managing volume due to AKI followed by rapid weight gain with stopping diuretics and dyspnea on exertion.  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 At that point she could still perform ADLs without significant difficulty. Roughly 1 year ago she was diagnosed with COVID19 pneumonia. She reports a significant decline in functional status at that point with difficulty in performing ADLs due to dyspnea. She was started on continuous home O2 earlier this year.   Interval hx:  Since her last visit, [name] continues to struggle with shortness of breath and lower extremity edema.  She still requires continuous home O2 at 2 to 3 L.  Reports that although today she feels much better than usual she has had episodes of hypoxia with O2 sats dropping down to 65% briefly.  Activity level/exercise tolerance:  NYHA IIIB, limited by significant lower back pain Orthopnea:  Sleeps on 3 pillows Paroxysmal noctural dyspnea:  no Chest pain/pressure:  no Orthostatic lightheadedness:  infrequent Palpitations:  no Lower extremity edema:  yes Presyncope/syncope:  no Cough:  infrequent  Past  Medical History:  Diagnosis Date   Asthmatic bronchitis    CHF (congestive heart failure) ([place]) dx'd 2016   Coronary atherosclerosis of native coronary artery    a. s/p CABG 2005 Dr Roxy Manns     Hyperlipidemia    Hypertension    Hypertensive heart disease    Kidney stones    Mixed hyperlipidemia    Nephrolithiasis 06/07/2015   Paroxysmal atrial fibrillation ([place])    a. s/p Maze in 2005 b. recurrence in March 2016 with >8sec posttermination pauses s/p PPM implant     Pneumonia 1950   "double"   Presence of permanent cardiac pacemaker    Restrictive lung disease 03/03/2018   Sleep apnea    "tested; mask ordered; couldn't afford it; will get it now" (09/04/2015)   TIA (transient ischemic attack)    Type II diabetes mellitus ([place])    Uterine cancer [facility])    age 65 with partial hysterectomy    Current Outpatient Medications  Medication Sig Dispense Refill   acetaminophen (TYLENOL) 500 MG tablet Take 500-1,000 mg by mouth every 6 (six) hours as needed for moderate pain.     apixaban (ELIQUIS) 5 MG TABS tablet TAKE ONE TABLET TWICE DAILY 60 tablet 5   B Complex-Biotin-FA (B-COMPLEX PO) Take 1 capsule by mouth every morning.      Biotin 1000 MCG tablet Take 1,000 mcg by mouth daily.     Cholecalciferol (DIALYVITE VITAMIN D 5000) 125 MCG (5000 UT) capsule Take 5,000 Units by mouth 2 (two) times daily.     Coenzyme Q10 (CO Q-10) 200 MG CAPS Take 200 mg by mouth daily.     diphenhydramine-acetaminophen (TYLENOL PM) 25-500 MG TABS tablet Take 1  tablet by mouth at bedtime as needed (pain/sleep).      fenofibrate 160 MG tablet TAKE 1 TABLET DAILY 90 tablet 3   furosemide (LASIX) 40 MG tablet Take 40 mg by mouth 2 (two) times daily.     HUMALOG MIX 75/25 KWIKPEN (75-25) 100 UNIT/ML Kwikpen Inject 34-40 Units into the skin See admin instructions. Inject 40 units in the morning, 34 units midday, and 40 units in the evening     loratadine (CLARITIN) 10 MG tablet Take 10 mg by mouth at bedtime as needed  for allergies.     losartan (COZAAR) 50 MG tablet Take 50 mg by mouth daily.     metolazone (ZAROXOLYN) 2.5 MG tablet Take 2.5 mg by mouth as needed.     metoprolol succinate (TOPROL-XL) 50 MG 24 hr tablet Take 50 mg by mouth daily.     potassium chloride (KLOR-CON) 10 MEQ tablet Take 10 mEq by mouth daily.     No current facility-administered medications for this encounter.    Allergies  Allergen Reactions   Amiodarone      pulmonary fibrosis   Gabapentin     edema   Torsemide Nausea Only    Headache      Social History   Socioeconomic History   Marital status: Married    Spouse name: Not on file   Number of children: Not on file   Years of education: Not on file   Highest education level: Not on file  Occupational History   Not on file  Tobacco Use   Smoking status: Never   Smokeless tobacco: Never  Vaping Use   Vaping Use: Never used  Substance and Sexual Activity   Alcohol use: No   Drug use: No   Sexual activity: Not on file  Other Topics Concern   Not on file  Social History Narrative   Not on file   Social Determinants of Health   Financial Resource Strain: Low Risk  (12/09/2018)   Overall Financial Resource Strain (CARDIA)    Difficulty of Paying Living Expenses: Not hard at all  Food Insecurity: Unknown (12/09/2018)   Hunger Vital Sign    Worried About Running Out of Food in the Last Year: Patient refused    Ran Out of Food in the Last Year: Patient refused  Transportation Needs: Unknown (12/09/2018)   PRAPARE - Hydrologist (Medical): Patient refused    Lack of Transportation (Non-Medical): Patient refused  Physical Activity: Not on file  Stress: Not on file  Social Connections: Not on file  Intimate Partner Violence: Unknown (12/09/2018)   Humiliation, Afraid, Rape, and Kick questionnaire    Fear of Current or Ex-Partner: Patient refused    Emotionally Abused: Patient refused    Physically Abused: Patient refused     Sexually Abused: Patient refused      Family History  Problem Relation Age of Onset   Hypertension Mother    Hyperlipidemia Mother    Heart disease Mother    Diabetes Father    Stroke Father    Heart disease Father    Hypertension Sister        3 sisters   Arthritis Brother    Heart attack Maternal Grandfather    Stroke Paternal Grandfather    Hypertension Daughter     PHYSICAL EXAM: Vitals:   03/24/22 1041  BP: 122/60  Pulse: 69  SpO2: 100%   GENERAL: Chronically ill-appearing female on 2 L nasal cannula HEENT:  Negative for arcus senilis or xanthelasma. There is no scleral icterus.  The mucous membranes are pink and moist.   NECK: Supple, No masses. Normal carotid upstrokes without bruits. No masses or thyromegaly.    CHEST: There are no chest wall deformities. There is no chest wall tenderness. Respirations are unlabored.  Lungs-CTA bilaterally, very few fine crackles at bases CARDIAC: Difficult to visualize JVP         Normal S1, S2  Normal rate with regular rhythm. No murmurs, rubs or gallops.  Pulses are 2+ and symmetrical in upper and lower extremities.  2+ lower extremity edema ABDOMEN: Soft, non-tender, non-distended. There are no masses or hepatomegaly. There are normal bowel sounds.  EXTREMITIES: Cool with 2+ lower extremity edema LYMPHATIC: No axillary or supraclavicular lymphadenopathy.  NEUROLOGIC: Patient is oriented x3 with no focal or lateralizing neurologic deficits.  PSYCH: Patients affect is appropriate, there is no evidence of anxiety or depression.  SKIN: Warm and dry; no lesions or wounds.   DATA REVIEW  LTR:VUYEBX fibrillation w/ V paced rhythm  ECHO 06/10/21: LVEF 65%, RV mildly enlarged with RVSP 47 12/09/18: LVEF 55%, RV mildly enlarged with mildly reduced function.   RHC: 02/26/22:  Conclusion  HEMODYNAMICS: Significant respiratory variation, end - expiratory values used. RA:                  9 mmHg (mean) RV:                  57/6-9  mmHg PA:                  57/23 mmHg (34 mean) PCWP:            16 with lage V waves up to 71mHg                                      Estimated Fick CO/CI   6.3 L/min, 3.2 L/min/m2 Thermodilution CO/CI   5 L/min, 2.5 L/min/m2                                      TPG                 18  mmHg                                            PVR                 2.9-3.6 [place] Units  PAPi                3.8       IMPRESSION: Moderately elevated pre-capillary filling pressures and PA mean.  Mildly elevated post-capillary filling pressures.  Large V waves with no MR on TTE likely suggestive of poor left atrial compliance.  Normal cardiac output / cardiac index.  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   ASSESSMENT & PLAN:  Pulmonary hypertension  - Followed by Dr. HSilas Floodin pulmonology due to concern for hypersensitivity pneumonitis vs NSIP (potential sequale of prior amiodarone exposure). HRCT w/ significant air trapping, enlarged pulmonary artery consistent with IPF.  - RHC w/ PVR  of 2.9-3.6 w/ PA mean of 34; PH likely secondary to combined pre and post capillary PH with a predominant pre capillary component. Will discuss possibility of Tyvaso with pulmonology and consideration of PET scan to r/o sarcoid (see HRCT findings).  - At this time her SOB is very unlikely to be of cardiac etiology.  2. CKD  - sCr once more elvated; will hold off on spironolactone. 2+ pitting edema in b/l lower extremities; asked to take lasix today and tomorrow.  3. CAD - s/p CABG in 2005 with MAZE  4. Permanent atrial fibrillation  - s/p AVN w/ PPM. LV function intact.     [name] Advanced Heart Failure Mechanical Circulatory Support

## 2022-04-01 ENCOUNTER — Other Ambulatory Visit (HOSPITAL_COMMUNITY): Payer: Medicare Other

## 2022-04-09 ENCOUNTER — Ambulatory Visit (HOSPITAL_COMMUNITY)
Admission: RE | Admit: 2022-04-09 | Discharge: 2022-04-09 | Disposition: A | Discharge status: Home / Self Care | Payer: Medicare Other | Source: Ambulatory Visit | Attending: Internal Medicine | Admitting: Internal Medicine

## 2022-04-09 DIAGNOSIS — I5032 Chronic diastolic (congestive) heart failure: Secondary | ICD-10-CM | POA: Diagnosis present

## 2022-04-09 LAB — BASIC METABOLIC PANEL
Anion gap: 14 (ref 5–15)
BUN: 47 mg/dL — ABNORMAL HIGH (ref 8–23)
CO2: 34 mmol/L — ABNORMAL HIGH (ref 22–32)
Calcium: 9.9 mg/dL (ref 8.9–10.3)
Chloride: 87 mmol/L — ABNORMAL LOW (ref 98–111)
Creatinine, Ser: 2.17 mg/dL — ABNORMAL HIGH (ref 0.44–1.00)
GFR, Estimated: 23 mL/min — ABNORMAL LOW (ref >60)
Glucose, Bld: 145 mg/dL — ABNORMAL HIGH (ref 70–99)
Potassium: 4.7 mmol/L (ref 3.5–5.1)
Sodium: 135 mmol/L (ref 135–145)

## 2022-04-10 ENCOUNTER — Telehealth (HOSPITAL_COMMUNITY): Payer: Self-pay

## 2022-04-10 NOTE — Telephone Encounter (Signed)
Patient's daughter called and asked if we were ordering a PET scan for [facility]. I saw in your note you were going to talk with pulmonology. Do you want [place] to order?

## 2022-04-17 ENCOUNTER — Encounter (HOSPITAL_COMMUNITY): Payer: Self-pay

## 2022-04-17 ENCOUNTER — Telehealth (HOSPITAL_COMMUNITY): Payer: Self-pay | Admitting: *Deleted

## 2022-04-17 NOTE — Telephone Encounter (Signed)
Called patient per Dr. Daniel Nones with following instructions:  "Can we bring Ms. Gluth in for repeat BMP/BNP in the next 2-3 days to recheck her kidney function."  Patient reports she saw Dr. Harrie Jeans (Nephrology) at [facility] with lab work and medication adjustment. Her Arlyce Harman was stopped. She is to return to see Dr. Royce Macadamia with repeat lab work in 3 weeks.   Patient and daughter have question about lung scan that Dr. Daniel Nones mentioned. He asked that she f/u with her pulmonologist (Dr. Silas Flood) for this testing. She has "not heard from him in a long time".  Called his office and arranged appointment for patient on: 04/23/22 at 9:00 am. Spoke with daughter and she verbalized understanding and appreciation of same.   Dr. Daniel Nones office note faxed to [facility]:       Office: [phone]       Fax: [phone]

## 2022-04-17 NOTE — Telephone Encounter (Signed)
Sent patient MY Chart message letting her know that pulmonary would be calling to set up PET scan.

## 2022-04-17 NOTE — Progress Notes (Signed)
Remote pacemaker transmission.   

## 2022-04-23 ENCOUNTER — Encounter: Payer: Self-pay | Admitting: Pulmonary Disease

## 2022-04-23 ENCOUNTER — Ambulatory Visit: Payer: Medicare Other | Admitting: Pulmonary Disease

## 2022-04-23 VITALS — BP 122/68 | HR 73 | Wt 227.0 lb

## 2022-04-23 DIAGNOSIS — I272 Pulmonary hypertension, unspecified: Secondary | ICD-10-CM | POA: Diagnosis not present

## 2022-04-23 DIAGNOSIS — J849 Interstitial pulmonary disease, unspecified: Secondary | ICD-10-CM | POA: Diagnosis not present

## 2022-04-23 NOTE — Patient Instructions (Signed)
Nice to see you again  No changes to medication  Medication to consider starting the future is called Tyvaso, this is an inhaled medicine to treat pulmonary hypertension that was diagnosed on the right heart catheterization recently  Sarcoidosis certainly possible, in the past her inflammatory markers have been normal which would be reassuring.  PET scan may increase or decrease suspicion of sarcoidosis but ultimately a biopsy is needed if we are going to decide to treat or not.  Given the hesitation to go under general anesthesia to be put to sleep, it sounds like biopsy is not a good idea.  Please read about Tyvaso and send me your thoughts in the interim.  Return to clinic in 3 months or sooner as needed with Dr. Silas Flood

## 2022-04-23 NOTE — Progress Notes (Signed)
$'@Patient'L$  ID: [name], female    DOB: Aug 31, 1945, 77 y.o.   MRN: [id number]  Chief Complaint  Patient presents with   Follow-up    Heart docotor toldl pt to follow up with pulmoanry to get a possible PET scan to rule out sarcoid. Pt had echocardiogram done 02/26/2022. Pt states she is doing ok since last visit. Pt is on 2L of oxygen via POC. A few times she has has to increase oxygen to 3L. But she is complaining of having more issues getting around doing anything.     Referring provider: Velna Hatchet, MD  HPI:   77 y.o. woman whom we are seeing for evaluation of pulmonary hypertension confirmed right heart cath 02/2022, ILD, chronic hypoxemic respiratory failure.  Most recent cardiology note reviewed.  Returns for routine follow-up.  In interim since last visit had right heart catheterization demonstrated mean PA pressure in the mid 30s, wedge of 18 that is elevated, preserved cardiac output and index and PVR between 2.9 and 3.5 depending on Fick versus thermodilution.  CT scan again reviewed that shows scattered mosaicism and groundglass.  Discussed at length possibly sarcoidosis.  This is possible to get imaging findings to me are not classic.  Certainly sarcoidosis do anything.  However, to pursue treatment of sarcoidosis and needed biopsy.  PET scan may be more less suggestive for increase or decrease posttest probability, but without biopsy I am not confident in pursuing treatment for sarcoidosis regardless of imaging findings.  We discussed at length her pulmonary hypertension diagnosis.  With her mitral valve stenosis and prominent V waves, hesitant for any pulmonary vasodilators.  With her significant parenchymal disease, would not pursue systemic pulmonary vasodilators.  We did discuss a trial of Tyvaso.  Which was recommended.  She is precontemplative.  HPI at initial visit: Patiently recently reestablished with pulmonary clinic.  Increased cough.  Some dyspnea on exertion  as well.  In the past and followed with Dr. Chase Caller.  CT scan with interstitial changes, concern for hypersensitivity pneumonitis.  Did not follow-up.  Seen recently.  CT high-res shows worsening interlobular septal thickening, mosaicism, scattered nodular opacities concerning for hypersensitivity pneumonitis, sarcoidosis considered.  PA appeared enlarged on that CT scan.  This prompted TTE which demonstrated RV dysfunction, RA dilation.  Review of TTE dating back to 2020 shows RV dysfunction.  Not present in 2018.  She has dyspnea.  With relatively robust exertion.  Normal walking outside bed.  Worse with inclines or stairs.  Worsening a bit over the last several months to year or 2.  PMH: Atrial fibrillation, hypertension, diastolic congestive heart failure, CAD Surgical history: CABG, cholecystectomy Family history:Mother with hypertension, CAD, hyperlipidemia, father with diabetes, CAD, CVA Social history: Never smoker, lives in [place] / Pulmonary Flowsheets:   ACT:      No data to display           MMRC:     No data to display           Epworth:      No data to display           Tests:   FENO:  No results found for: "NITRICOXIDE"  PFT:    Latest Ref Rng & Units 08/29/2021    9:55 AM 03/01/2019    8:56 AM 12/12/2018    4:50 PM 12/28/2017    3:47 PM  PFT Results  FVC-Pre L 1.25  1.55  1.23  FVC-Predicted Pre % 45  54  42  56   FVC-Post L 1.25    1.61   FVC-Predicted Post % 45    55   Pre FEV1/FVC % % 93  92  92  91   Post FEV1/FCV % % 93    91   FEV1-Pre L 1.16  1.43  1.12  1.49   FEV1-Predicted Pre % 56  66  52  68   FEV1-Post L 1.17    1.47   DLCO uncorrected ml/min/mmHg 10.88  18.22  11.35  15.97   DLCO UNC% % 57  95  59  67   DLCO corrected ml/min/mmHg 10.88   12.17    DLCO COR %Predicted % 57   64    DLVA Predicted % 122  155  152  121   TLC L 2.71    2.93   TLC % Predicted % 54    58   RV % Predicted  % 54    52   02/2019 reviewed and interpreted as spirometry suggestive of moderate restriction, DLCO within normal limits 11/2018 reviewed and interpreted as chronically suggestive of moderate restriction, DLCO moderately reduced 12/2017 reviewed and interpreted as primary suggestive of my restriction versus air trapping, no bronchodilator response, lung volumes consistent with moderate restriction, DLCO moderately reduced 08/2021 reviewed interpreted spirometry suggestive of severe restriction versus air trapping, no bronchodilator response, lung volumes consistent with moderate restriction with moderately reduced DLCO WALK:     06/28/2019   11:15 AM 03/01/2019   10:31 AM 12/28/2018    3:27 PM 02/04/2018    9:39 AM  SIX MIN WALK  Supplimental Oxygen during Test? (L/min) No No No No  Tech Comments: slow pace walk, SOB after first lap, three stops to rest, sats WNL normal pace, knee pain, no dsat TA/CMA Pt did not want to continue due to leg pain. Pt did have some dypsnea. Pt ambulated at a slow pace with a steady gait.     Imaging: Personally reviewed No results found.  Lab Results: Personally reviewed CBC    Component Value Date/Time   WBC 10.1 02/10/2022 1110   RBC 3.49 (L) 02/10/2022 1110   HGB 10.5 (L) 02/26/2022 1245   HGB 12.2 03/24/2019 1237   HCT 31.0 (L) 02/26/2022 1245   HCT 36.5 03/24/2019 1237   PLT 350 02/10/2022 1110   PLT 381 03/24/2019 1237   MCV 88.0 02/10/2022 1110   MCV 88 03/24/2019 1237   MCH 28.1 02/10/2022 1110   MCHC 31.9 02/10/2022 1110   RDW 14.8 02/10/2022 1110   RDW 14.0 03/24/2019 1237   LYMPHSABS 3.7 12/12/2018 0321   MONOABS 0.7 12/12/2018 0321   EOSABS 0.2 12/12/2018 0321   BASOSABS 0.1 12/12/2018 0321    BMET    Component Value Date/Time   NA 135 04/09/2022 1108   NA 141 10/18/2021 0925   K 4.7 04/09/2022 1108   CL 87 (L) 04/09/2022 1108   CO2 34 (H) 04/09/2022 1108   GLUCOSE 145 (H) 04/09/2022 1108   BUN 47 (H) 04/09/2022 1108    BUN 39 (H) 10/18/2021 0925   CREATININE 2.17 (H) 04/09/2022 1108   CALCIUM 9.9 04/09/2022 1108   GFRNONAA 23 (L) 04/09/2022 1108   GFRAA 76 03/24/2019 1237    BNP    Component Value Date/Time   BNP 75.8 03/24/2022 1140    ProBNP    Component Value Date/Time   PROBNP 114.0 (H) 10/21/2021 1227  Specialty Problems       Pulmonary Problems   Sleep apnea-unable to tolerate C-pap   Amiodarone pulmonary toxicity   Restrictive lung disease   ILD (interstitial lung disease) (HCC)    Allergies  Allergen Reactions   Amiodarone      pulmonary fibrosis   Gabapentin     edema   Torsemide Nausea Only    Headache    Immunization History  Administered Date(s) Administered   Pneumococcal Conjugate-13 12/09/2016    Past Medical History:  Diagnosis Date   Asthmatic bronchitis    CHF (congestive heart failure) ([place]) dx'd 2016   Coronary atherosclerosis of native coronary artery    a. s/p CABG 2005 Dr Roxy Manns     Hyperlipidemia    Hypertension    Hypertensive heart disease    Kidney stones    Mixed hyperlipidemia    Nephrolithiasis 06/07/2015   Paroxysmal atrial fibrillation ([place])    a. s/p Maze in 2005 b. recurrence in March 2016 with >8sec posttermination pauses s/p PPM implant     Pneumonia 1950   "double"   Presence of permanent cardiac pacemaker    Restrictive lung disease 03/03/2018   Sleep apnea    "tested; mask ordered; couldn't afford it; will get it now" (09/04/2015)   TIA (transient ischemic attack)    Type II diabetes mellitus ([place])    Uterine cancer [facility])    age 76 with partial hysterectomy    Tobacco History: Social History   Tobacco Use  Smoking Status Never  Smokeless Tobacco Never   Counseling given: Not Answered   Continue to not smoke  Outpatient Encounter Medications as of 04/23/2022  Medication Sig   acetaminophen (TYLENOL) 500 MG tablet Take 500-1,000 mg by mouth every 6 (six) hours as needed for moderate pain.   apixaban (ELIQUIS) 5 MG  TABS tablet TAKE ONE TABLET TWICE DAILY   B Complex-Biotin-FA (B-COMPLEX PO) Take 1 capsule by mouth every morning.    Biotin 1000 MCG tablet Take 1,000 mcg by mouth daily.   Cholecalciferol (DIALYVITE VITAMIN D 5000) 125 MCG (5000 UT) capsule Take 5,000 Units by mouth 2 (two) times daily.   Coenzyme Q10 (CO Q-10) 200 MG CAPS Take 200 mg by mouth daily.   diphenhydramine-acetaminophen (TYLENOL PM) 25-500 MG TABS tablet Take 1 tablet by mouth at bedtime as needed (pain/sleep).    fenofibrate 160 MG tablet TAKE 1 TABLET DAILY   furosemide (LASIX) 40 MG tablet Take 40 mg by mouth 2 (two) times daily.   HUMALOG MIX 75/25 KWIKPEN (75-25) 100 UNIT/ML Kwikpen Inject 34-40 Units into the skin See admin instructions. Inject 40 units in the morning, 34 units midday, and 40 units in the evening   loratadine (CLARITIN) 10 MG tablet Take 10 mg by mouth at bedtime as needed for allergies.   losartan (COZAAR) 50 MG tablet Take 50 mg by mouth daily.   metolazone (ZAROXOLYN) 2.5 MG tablet Take 2.5 mg by mouth as needed.   metoprolol succinate (TOPROL-XL) 50 MG 24 hr tablet Take 50 mg by mouth daily.   [DISCONTINUED] potassium chloride (KLOR-CON) 10 MEQ tablet Take 10 mEq by mouth daily.   [DISCONTINUED] spironolactone (ALDACTONE) 25 MG tablet Take 0.5 tablets (12.5 mg total) by mouth daily.   No facility-administered encounter medications on file as of 04/23/2022.     Review of Systems  Review of Systems  N/a Physical Exam  BP 122/68 (BP Location: Left Arm, Patient Position: Sitting, Cuff Size: Normal)   Pulse  73   Wt 227 lb (103 kg)   SpO2 98%   BMI 42.89 kg/m   Wt Readings from Last 5 Encounters:  04/23/22 227 lb (103 kg)  03/24/22 227 lb 6.4 oz (103.1 kg)  02/26/22 219 lb (99.3 kg)  02/10/22 224 lb 3.2 oz (101.7 kg)  01/08/22 232 lb 9.6 oz (105.5 kg)    BMI Readings from Last 5 Encounters:  04/23/22 42.89 kg/m  03/24/22 42.97 kg/m  02/26/22 41.38 kg/m  02/10/22 38.48 kg/m  01/08/22  39.93 kg/m     Physical Exam General: Sitting in chair, no acute distress Eyes: EOMI, icterus Neck: Supple, no JVP Pulmonary: Faint crackles upper lung fields, otherwise clear, normal work of breathing Cardiovascular: Regular rate and rhythm, no murmur Bowel sounds present MSK: No synovitis, no joint effusion Neuro: Normal gait, no weakness Psych: Normal mood, full affect   Assessment & Plan:   Pulmonary hypertension: Suspect this is multifactorial.  Longstanding evidence of left-sided structural disease or group 2 disease including mitral valve stenosis dating back to 2018, dilated left atrium in 2020, and ongoing dilated left atrium more recently 2023.  There is been RV dysfunction since at least 11/2018 including right atrial pressure elevation at that time.  Other contributors include group 3 disease given her interstitial lung disease on CT scan.  High concern for undiagnosed hypoxemia given significant interstitial changes.   Untreated sleep apnea likely contributing.  Encouraged to resume CPAP therapy.  No known history of VTE.  Low suspicion for Group 1 disease.  Overall, not good candidate for pulmonary vasodilators given left-sided disease as well as her inability to tolerate or use CPAP.   Her right heart catheterization 02/2022 was relatively reassuring but also consistent with the above with elevated wedge, PVR 2.93.5, mildly elevated.  Discussed at length risk of systemic pulm vasodilators given structural heart disease and interstitial lung disease.  Discussed Tyvaso which I think is a reasonable thing to try but did counsel her on possible adverse events to be looking out for.  I recommended trial of Tyvaso.  As of now, she does not want take more medicines.  She and family do more research and get back to me if she would like to start the process prior to next scheduled follow-up.  Chronic hypoxemic respiratory failure: Related ILD, waxes and wanes but usually on 2  L.  Interstitial lung disease: On CT scan, worse 2023 compared to 2020.  HSP versus NSIP in my opinion.  Possibly related to prior amiodarone exposure.  Consider antifibrotic's versus immunosuppressants in the future.  Repeat CT scan 11/2021.  PFTs 2023 largely unchanged compared to 2020.  Notably PFTs in 2021 seem spurious with multiple values much higher than these test that preceded and performed after that test in 2021.  Inflammatory markers flat 08/2021.  Think biopsy would be helpful but given her tenuous cardiovascular, pulmonary, and renal disease this is high risk and not recommended at this time.  She seems at peace at this time but we do not have really any options for further evaluation or treatment.  Sarcoidosis possible.  But without biopsy would not pursue treatment.  PET scan could be informative, decrease order increase posttest probability of sarcoidosis but without biopsy would not change treatment decisions.  She does not want to pursue biopsy as she does not want to be put to sleep which has been told its likely she would not wake up from anesthesia.  Return in about 3 months (around 07/22/2022).  Lanier Clam, MD 04/23/2022   This appointment required 41 minutes of patient care (this includes precharting, chart review, review of results, face-to-face care, etc.).

## 2022-05-13 ENCOUNTER — Other Ambulatory Visit: Payer: Self-pay | Admitting: Nephrology

## 2022-05-13 DIAGNOSIS — N184 Chronic kidney disease, stage 4 (severe): Secondary | ICD-10-CM

## 2022-05-13 DIAGNOSIS — E1122 Type 2 diabetes mellitus with diabetic chronic kidney disease: Secondary | ICD-10-CM

## 2022-05-13 DIAGNOSIS — I272 Pulmonary hypertension, unspecified: Secondary | ICD-10-CM

## 2022-05-13 DIAGNOSIS — I480 Paroxysmal atrial fibrillation: Secondary | ICD-10-CM

## 2022-05-13 DIAGNOSIS — I5032 Chronic diastolic (congestive) heart failure: Secondary | ICD-10-CM

## 2022-05-13 DIAGNOSIS — I129 Hypertensive chronic kidney disease with stage 1 through stage 4 chronic kidney disease, or unspecified chronic kidney disease: Secondary | ICD-10-CM

## 2022-05-13 DIAGNOSIS — J849 Interstitial pulmonary disease, unspecified: Secondary | ICD-10-CM

## 2022-06-02 ENCOUNTER — Other Ambulatory Visit: Payer: Self-pay | Admitting: Internal Medicine

## 2022-06-02 ENCOUNTER — Other Ambulatory Visit: Payer: Self-pay | Admitting: Cardiovascular Disease

## 2022-06-05 ENCOUNTER — Ambulatory Visit
Admission: RE | Admit: 2022-06-05 | Discharge: 2022-06-05 | Disposition: A | Discharge status: Home / Self Care | Payer: Medicare Other | Source: Ambulatory Visit | Attending: Nephrology | Admitting: Nephrology

## 2022-06-05 DIAGNOSIS — J849 Interstitial pulmonary disease, unspecified: Secondary | ICD-10-CM

## 2022-06-05 DIAGNOSIS — I5032 Chronic diastolic (congestive) heart failure: Secondary | ICD-10-CM

## 2022-06-05 DIAGNOSIS — N184 Chronic kidney disease, stage 4 (severe): Secondary | ICD-10-CM

## 2022-06-05 DIAGNOSIS — I480 Paroxysmal atrial fibrillation: Secondary | ICD-10-CM

## 2022-06-05 DIAGNOSIS — I129 Hypertensive chronic kidney disease with stage 1 through stage 4 chronic kidney disease, or unspecified chronic kidney disease: Secondary | ICD-10-CM

## 2022-06-05 DIAGNOSIS — I272 Pulmonary hypertension, unspecified: Secondary | ICD-10-CM

## 2022-06-05 DIAGNOSIS — E1122 Type 2 diabetes mellitus with diabetic chronic kidney disease: Secondary | ICD-10-CM

## 2022-06-17 ENCOUNTER — Ambulatory Visit (INDEPENDENT_AMBULATORY_CARE_PROVIDER_SITE_OTHER): Payer: Medicare Other

## 2022-06-17 DIAGNOSIS — I442 Atrioventricular block, complete: Secondary | ICD-10-CM | POA: Diagnosis not present

## 2022-06-17 LAB — CUP PACEART REMOTE DEVICE CHECK
Battery Remaining Longevity: 10 mo
Battery Voltage: 2.88 V
Brady Statistic RA Percent Paced: 0 %
Brady Statistic RV Percent Paced: 97.92 %
Date Time Interrogation Session: 20240402125904
Implantable Lead Connection Status: 753985
Implantable Lead Connection Status: 753985
Implantable Lead Implant Date: 20160321
Implantable Lead Implant Date: 20160321
Implantable Lead Location: 753859
Implantable Lead Location: 753860
Implantable Lead Model: 5076
Implantable Lead Model: 5076
Implantable Pulse Generator Implant Date: 20160321
Lead Channel Impedance Value: 361 Ohm
Lead Channel Impedance Value: 437 Ohm
Lead Channel Impedance Value: 456 Ohm
Lead Channel Impedance Value: 475 Ohm
Lead Channel Pacing Threshold Amplitude: 0.625 V
Lead Channel Pacing Threshold Amplitude: 0.875 V
Lead Channel Pacing Threshold Pulse Width: 0.4 ms
Lead Channel Pacing Threshold Pulse Width: 0.4 ms
Lead Channel Sensing Intrinsic Amplitude: 0.5 mV
Lead Channel Sensing Intrinsic Amplitude: 0.5 mV
Lead Channel Sensing Intrinsic Amplitude: 6.25 mV
Lead Channel Sensing Intrinsic Amplitude: 6.25 mV
Lead Channel Setting Pacing Amplitude: 2.5 V
Lead Channel Setting Pacing Pulse Width: 0.4 ms
Lead Channel Setting Sensing Sensitivity: 4 mV
Zone Setting Status: 755011
Zone Setting Status: 755011

## 2022-06-27 ENCOUNTER — Telehealth: Payer: Self-pay | Admitting: Cardiovascular Disease

## 2022-06-27 NOTE — Telephone Encounter (Signed)
She is on the correct dose of Eliquis for atrial fibrillation and her age, weight and renal function. Patient should remain on 5mg  BID.   Gave the pt's daughter a call back and gave the above information. She verbalized understanding; will not change the Eliquis. She said that the pt is also seen by a kidney specialist and they have an appt in a few weeks and she will talk with them about her medication list and see if there are ways to make kidney function improve

## 2022-06-27 NOTE — Telephone Encounter (Signed)
She is on the correct dose of Eliquis for atrial fibrillation and her age, weight and renal function. Patient should remain on 5mg  BID.

## 2022-06-27 NOTE — Telephone Encounter (Signed)
Pt c/o medication issue:  1. Name of Medication: apixaban (ELIQUIS) 5 MG TABS tablet   2. How are you currently taking this medication (dosage and times per day)?   TAKE ONE TABLET TWICE DAILY    3. Are you having a reaction (difficulty breathing--STAT)? No  4. What is your medication issue? Patient's daughter called stating that the patient went to her other doctor yesterday. The doctor recommended that the patient stopped taking 5 MG of Eliquis and instead take 2.5 MG of Eliquis. Per patient's daughter, the doctor stated that the Eliquis was affecting her kidney's. Patient's daughter is calling to see if Dr. Tresa Endo thinks it would be okay for the patient to take a lower dosage of Eliquis. Please advise

## 2022-06-28 ENCOUNTER — Other Ambulatory Visit: Payer: Self-pay | Admitting: Cardiovascular Disease

## 2022-07-09 ENCOUNTER — Other Ambulatory Visit: Payer: Self-pay | Admitting: Cardiovascular Disease

## 2022-07-09 DIAGNOSIS — I480 Paroxysmal atrial fibrillation: Secondary | ICD-10-CM

## 2022-07-09 NOTE — Telephone Encounter (Signed)
Eliquis  refill request received. Patient is 77 years old, weight-103kg, Crea-2.17 on 04/09/22, Diagnosis-Afib, and last seen by Dr. Gasper Lloyd on 03/24/22. Dose is appropriate based on dosing criteria. Will send in refill to requested pharmacy.

## 2022-07-29 NOTE — Progress Notes (Signed)
Remote pacemaker transmission.   

## 2022-08-15 LAB — LAB REPORT - SCANNED
Creatinine, POC: 89.1 mg/dL
EGFR: 27

## 2022-08-19 ENCOUNTER — Other Ambulatory Visit: Payer: Self-pay | Admitting: Nephrology

## 2022-08-19 DIAGNOSIS — E1122 Type 2 diabetes mellitus with diabetic chronic kidney disease: Secondary | ICD-10-CM

## 2022-08-19 DIAGNOSIS — I509 Heart failure, unspecified: Secondary | ICD-10-CM

## 2022-08-19 DIAGNOSIS — J849 Interstitial pulmonary disease, unspecified: Secondary | ICD-10-CM

## 2022-08-19 DIAGNOSIS — I129 Hypertensive chronic kidney disease with stage 1 through stage 4 chronic kidney disease, or unspecified chronic kidney disease: Secondary | ICD-10-CM

## 2022-08-19 DIAGNOSIS — I480 Paroxysmal atrial fibrillation: Secondary | ICD-10-CM

## 2022-08-19 DIAGNOSIS — N184 Chronic kidney disease, stage 4 (severe): Secondary | ICD-10-CM

## 2022-08-19 DIAGNOSIS — I272 Pulmonary hypertension, unspecified: Secondary | ICD-10-CM

## 2022-09-03 ENCOUNTER — Other Ambulatory Visit: Payer: Self-pay | Admitting: Cardiovascular Disease

## 2022-09-16 ENCOUNTER — Ambulatory Visit (INDEPENDENT_AMBULATORY_CARE_PROVIDER_SITE_OTHER): Payer: Medicare Other

## 2022-09-16 DIAGNOSIS — I442 Atrioventricular block, complete: Secondary | ICD-10-CM | POA: Diagnosis not present

## 2022-09-17 LAB — CUP PACEART REMOTE DEVICE CHECK
Battery Remaining Longevity: 7 mo
Battery Voltage: 2.87 V
Brady Statistic RA Percent Paced: 0 %
Brady Statistic RV Percent Paced: 98.11 %
Date Time Interrogation Session: 20240702163919
Implantable Lead Connection Status: 753985
Implantable Lead Connection Status: 753985
Implantable Lead Implant Date: 20160321
Implantable Lead Implant Date: 20160321
Implantable Lead Location: 753859
Implantable Lead Location: 753860
Implantable Lead Model: 5076
Implantable Lead Model: 5076
Implantable Pulse Generator Implant Date: 20160321
Lead Channel Impedance Value: 361 Ohm
Lead Channel Impedance Value: 456 Ohm
Lead Channel Impedance Value: 456 Ohm
Lead Channel Impedance Value: 494 Ohm
Lead Channel Pacing Threshold Amplitude: 0.5 V
Lead Channel Pacing Threshold Amplitude: 0.875 V
Lead Channel Pacing Threshold Pulse Width: 0.4 ms
Lead Channel Pacing Threshold Pulse Width: 0.4 ms
Lead Channel Sensing Intrinsic Amplitude: 0.5 mV
Lead Channel Sensing Intrinsic Amplitude: 0.5 mV
Lead Channel Sensing Intrinsic Amplitude: 7 mV
Lead Channel Sensing Intrinsic Amplitude: 7 mV
Lead Channel Setting Pacing Amplitude: 2.5 V
Lead Channel Setting Pacing Pulse Width: 0.4 ms
Lead Channel Setting Sensing Sensitivity: 4 mV
Zone Setting Status: 755011
Zone Setting Status: 755011

## 2022-10-10 NOTE — Progress Notes (Signed)
Remote pacemaker transmission.   

## 2022-10-14 ENCOUNTER — Ambulatory Visit: Payer: Medicare Other | Attending: Internal Medicine | Admitting: Internal Medicine

## 2022-10-14 VITALS — BP 132/68 | HR 74 | Ht 64.0 in | Wt 229.0 lb

## 2022-10-14 DIAGNOSIS — I442 Atrioventricular block, complete: Secondary | ICD-10-CM

## 2022-10-14 LAB — CUP PACEART INCLINIC DEVICE CHECK
Battery Remaining Longevity: 5 mo
Battery Voltage: 2.86 V
Brady Statistic RA Percent Paced: 0 %
Brady Statistic RV Percent Paced: 97.93 %
Date Time Interrogation Session: 20240730205047
Implantable Lead Connection Status: 753985
Implantable Lead Connection Status: 753985
Implantable Lead Implant Date: 20160321
Implantable Lead Implant Date: 20160321
Implantable Lead Location: 753859
Implantable Lead Location: 753860
Implantable Lead Model: 5076
Implantable Lead Model: 5076
Implantable Pulse Generator Implant Date: 20160321
Lead Channel Impedance Value: 361 Ohm
Lead Channel Impedance Value: 437 Ohm
Lead Channel Impedance Value: 456 Ohm
Lead Channel Impedance Value: 475 Ohm
Lead Channel Pacing Threshold Amplitude: 0.5 V
Lead Channel Pacing Threshold Amplitude: 0.875 V
Lead Channel Pacing Threshold Pulse Width: 0.4 ms
Lead Channel Pacing Threshold Pulse Width: 0.4 ms
Lead Channel Sensing Intrinsic Amplitude: 0.5 mV
Lead Channel Sensing Intrinsic Amplitude: 0.75 mV
Lead Channel Sensing Intrinsic Amplitude: 11.25 mV
Lead Channel Sensing Intrinsic Amplitude: 11.25 mV
Lead Channel Setting Pacing Amplitude: 2.5 V
Lead Channel Setting Pacing Pulse Width: 0.4 ms
Lead Channel Setting Sensing Sensitivity: 4 mV
Zone Setting Status: 755011
Zone Setting Status: 755011

## 2022-10-14 NOTE — Progress Notes (Signed)
HPI [name] returns today for followup after undergoing AV node ablation over one year ago for uncontrolled atrial fib. In the interim, she has done well with her HR well controlled. She denies palpitations/chest pain, or sob. No worsening edema. Her weight is up another 2 lbs since her last visit.  Allergies  Allergen Reactions   Amiodarone      pulmonary fibrosis   Gabapentin     edema   Torsemide Nausea Only    Headache     Current Outpatient Medications  Medication Sig Dispense Refill   acetaminophen (TYLENOL) 500 MG tablet Take 500-1,000 mg by mouth every 6 (six) hours as needed for moderate pain.     B Complex-Biotin-FA (B-COMPLEX PO) Take 1 capsule by mouth every morning.      Biotin 1000 MCG tablet Take 1,000 mcg by mouth daily.     Cholecalciferol (DIALYVITE VITAMIN D 5000) 125 MCG (5000 UT) capsule Take 5,000 Units by mouth 2 (two) times daily.     Coenzyme Q10 (CO Q-10) 200 MG CAPS Take 200 mg by mouth daily.     diphenhydramine-acetaminophen (TYLENOL PM) 25-500 MG TABS tablet Take 1 tablet by mouth at bedtime as needed (pain/sleep).      ELIQUIS 5 MG TABS tablet TAKE ONE TABLET TWICE DAILY 60 tablet 5   FARXIGA 10 MG TABS tablet Take 10 mg by mouth daily.     fenofibrate 160 MG tablet TAKE 1 TABLET DAILY 90 tablet 3   furosemide (LASIX) 40 MG tablet TAKE (1) TABLET TWICE A DAY. 180 tablet 2   HUMALOG MIX 75/25 KWIKPEN (75-25) 100 UNIT/ML Kwikpen Inject 34-40 Units into the skin See admin instructions. Inject 40 units in the morning, 34 units midday, and 40 units in the evening     loratadine (CLARITIN) 10 MG tablet Take 10 mg by mouth at bedtime as needed for allergies.     losartan (COZAAR) 50 MG tablet TAKE 1 TABLET UPTO TWICE A DAY AS DIRECTED 180 tablet 1   metolazone (ZAROXOLYN) 2.5 MG tablet Take 2.5 mg by mouth as needed.     metoprolol succinate (TOPROL-XL) 50 MG 24 hr tablet TAKE 1 TABLET IN THE MORNING AND 1/2 TABLET IN THE EVENING 135 tablet 0   No  current facility-administered medications for this visit.     Past Medical History:  Diagnosis Date   Asthmatic bronchitis    CHF (congestive heart failure) (HCC) dx'd 2016   Coronary atherosclerosis of native coronary artery    a. s/p CABG 2005 Dr Cornelius Moras     Hyperlipidemia    Hypertension    Hypertensive heart disease    Kidney stones    Mixed hyperlipidemia    Nephrolithiasis 06/07/2015   Paroxysmal atrial fibrillation (HCC)    a. s/p Maze in 2005 b. recurrence in March 2016 with >8sec posttermination pauses s/p PPM implant     Pneumonia 1950   "double"   Presence of permanent cardiac pacemaker    Restrictive lung disease 03/03/2018   Sleep apnea    "tested; mask ordered; couldn't afford it; will get it now" (09/04/2015)   TIA (transient ischemic attack)    Type II diabetes mellitus (HCC)    Uterine cancer [facility])    age 32 with partial hysterectomy    ROS:   All systems reviewed and negative except as noted in the HPI.   Past Surgical History:  Procedure Laterality Date   AV NODE ABLATION N/A 03/28/2019  Procedure: AV NODE ABLATION;  Surgeon: Marinus Maw, MD;  Location: [facility] INVASIVE CV LAB;  Service: Cardiovascular;  Laterality: N/A;   CARDIAC CATHETERIZATION  2005   CORONARY ARTERY BYPASS GRAFT  2005   Dr Cornelius Moras with MAZE   CYSTOSCOPY WITH URETEROSCOPY, STONE BASKETRY AND STENT PLACEMENT Left 09/08/2014   Procedure: CYSTOSCOPY WITH URETEROSCOPY, STONE BASKETRY AND STENT PLACEMENT;  Surgeon: Malen Gauze, MD;  Location: [facility] ORS;  Service: Urology;  Laterality: Left;   CYSTOSCOPY/URETEROSCOPY/HOLMIUM LASER/STENT PLACEMENT Left 09/15/2014   Procedure: CYSTOSCOPY/RETROGRADE/URETEROSCOPY/HOLMIUM LASER/ STONE EXTRACTION /STENT EXCHANGE;  Surgeon: Malen Gauze, MD;  Location: [facility] ORS;  Service: Urology;  Laterality: Left;   HOLMIUM LASER APPLICATION Left 09/08/2014   Procedure: HOLMIUM LASER APPLICATION;  Surgeon: Malen Gauze, MD;  Location: [facility] ORS;  Service:  Urology;  Laterality: Left;   HOLMIUM LASER APPLICATION Left 09/15/2014   Procedure: HOLMIUM LASER APPLICATION;  Surgeon: Malen Gauze, MD;  Location: [facility] ORS;  Service: Urology;  Laterality: Left;   INSERT / REPLACE / REMOVE PACEMAKER     LAPAROSCOPIC CHOLECYSTECTOMY     PERMANENT PACEMAKER INSERTION N/A 06/05/2014   MDT 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   RIGHT HEART CATH N/A 02/26/2022   Procedure: RIGHT HEART CATH;  Surgeon: Dorthula Nettles, DO;  Location: [facility] INVASIVE CV LAB;  Service: Cardiovascular;  Laterality: N/A;   VAGINAL HYSTERECTOMY  1975     Family History  Problem Relation Age of Onset   Hypertension Mother    Hyperlipidemia Mother    Heart disease Mother    Diabetes Father    Stroke Father    Heart disease Father    Hypertension Sister        3 sisters   Arthritis Brother    Heart attack Maternal Grandfather    Stroke Paternal Grandfather    Hypertension Daughter      Social History   Socioeconomic History   Marital status: Married    Spouse name: Not on file   Number of children: Not on file   Years of education: Not on file   Highest education level: Not on file  Occupational History   Not on file  Tobacco Use   Smoking status: Never   Smokeless tobacco: Never  Vaping Use   Vaping status: Never Used  Substance and Sexual Activity   Alcohol use: No   Drug use: No   Sexual activity: Not on file  Other Topics Concern   Not on file  Social History Narrative   Not on file   Social Determinants of Health   Financial Resource Strain: Low Risk  (12/09/2018)   Overall Financial Resource Strain (CARDIA)    Difficulty of Paying Living Expenses: Not hard at all  Food Insecurity: Unknown (12/09/2018)   Hunger Vital Sign    Worried About Running Out of Food in the Last Year: Patient declined    Ran Out of Food in the Last Year: Patient declined  Transportation Needs: Unknown (12/09/2018)   PRAPARE -  Administrator, Civil Service (Medical): Patient declined    Lack of Transportation (Non-Medical): Patient declined  Physical Activity: Not on file  Stress: Not on file  Social Connections: Not on file  Intimate Partner Violence: Unknown (12/09/2018)   Humiliation, Afraid, Rape, and Kick questionnaire    Fear of Current or Ex-Partner: Patient declined    Emotionally Abused: Patient declined    Physically Abused: Patient declined  Sexually Abused: Patient declined     BP 132/68   Pulse 74   Ht 5\' 4"  (1.626 m)   Wt 229 lb (103.9 kg)   SpO2 96%   BMI 39.31 kg/m   Physical Exam:  Well appearing NAD HEENT: Unremarkable Neck:  No JVD, no thyromegally Lymphatics:  No adenopathy Back:  No CVA tenderness Lungs:  Clear with no wheezes HEART:  Regular rate rhythm, no murmurs, no rubs, no clicks Abd:  soft, positive bowel sounds, no organomegally, no rebound, no guarding Ext:  2 plus pulses, no edema, no cyanosis, no clubbing Skin:  No rashes no nodules Neuro:  CN II through XII intact, motor grossly intact  EKG - NR with PVC's and ventricular pacing  DEVICE  Normal device function.  See PaceArt for details.   Assess/Plan: 1. Atrial fib - her VR is now well controlled, s/p AV node ablation.  2. PPM - her Medtronic DDD PM is working normally. We will recheck in several months. 3. HTN - her bp is minimally elevated. She is encouraged to reduce her sodium intake. 4. Obesity - she has lost 40 lbs but gained a bunch back and I have encouraged her to work on weight loss. The goal would be to get under 200 lbs.   Dana Gowda Amelie Caracci,MD

## 2022-10-14 NOTE — Patient Instructions (Signed)
Medication Instructions:  Your physician recommends that you continue on your current medications as directed. Please refer to the Current Medication list given to you today.  *If you need a refill on your cardiac medications before your next appointment, please call your pharmacy*  Follow-Up: At [facility], you and your health needs are our priority.  As part of our continuing mission to provide you with exceptional heart care, we have created designated Provider Care Teams.  These Care Teams include your primary Cardiologist (physician) and Advanced Practice Providers (APPs -  Physician Assistants and Nurse Practitioners) who all work together to provide you with the care you need, when you need it.  Your next appointment:   April 2024  Provider:   Lewayne Bunting, MD

## 2022-11-03 ENCOUNTER — Encounter: Payer: Self-pay | Admitting: Registered Nurse

## 2022-12-04 ENCOUNTER — Other Ambulatory Visit: Payer: Self-pay | Admitting: Cardiovascular Disease

## 2022-12-26 ENCOUNTER — Ambulatory Visit (INDEPENDENT_AMBULATORY_CARE_PROVIDER_SITE_OTHER): Payer: Medicare Other

## 2022-12-26 DIAGNOSIS — I442 Atrioventricular block, complete: Secondary | ICD-10-CM

## 2022-12-26 LAB — CUP PACEART REMOTE DEVICE CHECK
Battery Remaining Longevity: 3 mo
Battery Voltage: 2.85 V
Brady Statistic RA Percent Paced: 0 %
Brady Statistic RV Percent Paced: 97.44 %
Date Time Interrogation Session: 20241011123422
Implantable Lead Connection Status: 753985
Implantable Lead Connection Status: 753985
Implantable Lead Implant Date: 20160321
Implantable Lead Implant Date: 20160321
Implantable Lead Location: 753859
Implantable Lead Location: 753860
Implantable Lead Model: 5076
Implantable Lead Model: 5076
Implantable Pulse Generator Implant Date: 20160321
Lead Channel Impedance Value: 380 Ohm
Lead Channel Impedance Value: 475 Ohm
Lead Channel Impedance Value: 475 Ohm
Lead Channel Impedance Value: 513 Ohm
Lead Channel Pacing Threshold Amplitude: 0.625 V
Lead Channel Pacing Threshold Amplitude: 0.875 V
Lead Channel Pacing Threshold Pulse Width: 0.4 ms
Lead Channel Pacing Threshold Pulse Width: 0.4 ms
Lead Channel Sensing Intrinsic Amplitude: 0.5 mV
Lead Channel Sensing Intrinsic Amplitude: 0.5 mV
Lead Channel Sensing Intrinsic Amplitude: 11.375 mV
Lead Channel Sensing Intrinsic Amplitude: 11.375 mV
Lead Channel Setting Pacing Amplitude: 2.5 V
Lead Channel Setting Pacing Pulse Width: 0.4 ms
Lead Channel Setting Sensing Sensitivity: 4 mV
Zone Setting Status: 755011
Zone Setting Status: 755011

## 2023-01-02 ENCOUNTER — Other Ambulatory Visit: Payer: Self-pay | Admitting: Cardiovascular Disease

## 2023-01-02 ENCOUNTER — Other Ambulatory Visit: Payer: Self-pay | Admitting: Physician Assistant

## 2023-01-02 DIAGNOSIS — I480 Paroxysmal atrial fibrillation: Secondary | ICD-10-CM

## 2023-01-02 NOTE — Telephone Encounter (Signed)
Prescription refill request for Eliquis received. Indication:afib Last office visit:7/24 Scr:1.75  8/24 Age: 77 Weight:103.9  kg  Prescription refilled

## 2023-01-05 NOTE — Progress Notes (Signed)
Remote pacemaker transmission.   

## 2023-01-07 ENCOUNTER — Ambulatory Visit: Payer: Medicare Other | Admitting: Pulmonary Disease

## 2023-01-30 ENCOUNTER — Ambulatory Visit: Payer: Medicare Other | Attending: Cardiovascular Disease | Admitting: Cardiovascular Disease

## 2023-01-30 ENCOUNTER — Encounter: Payer: Self-pay | Admitting: Cardiovascular Disease

## 2023-01-30 VITALS — BP 130/74 | HR 70 | Ht 64.0 in | Wt 229.2 lb

## 2023-01-30 DIAGNOSIS — I480 Paroxysmal atrial fibrillation: Secondary | ICD-10-CM | POA: Diagnosis not present

## 2023-01-30 MED ORDER — METOPROLOL SUCCINATE ER 50 MG PO TB24: 50.0000 mg | ORAL_TABLET | Freq: Every day | ORAL | 3 refills | Status: DC

## 2023-01-30 MED ORDER — LOSARTAN POTASSIUM 50 MG PO TABS: 50.0000 mg | ORAL_TABLET | Freq: Every day | ORAL | 3 refills | Status: DC

## 2023-01-30 MED ORDER — APIXABAN 5 MG PO TABS: 5.0000 mg | ORAL_TABLET | Freq: Two times a day (BID) | ORAL | Status: DC

## 2023-01-30 NOTE — Progress Notes (Signed)
Patient ID: [name], female   DOB: 1945/08/01, 77 y.o.   MRN: [id number]       Primary M.D.: Dr. Link Snuffer  HPI: [name] is a 77 year-old female who presents for an 30 month follow-up cardiology and sleep evaluation.    [name] has a history of CAD, diabetes mellitus, peripheral neuropathy, OSA and hypertension.   She underwent CABG revascularization surgery x2 with a maze procedure for atrial fibrillation at that time by Dr. Cornelius Moras.  She experienced 2 episodes of palpitations where she felt her heart was temporarily out of rhythm. She denied associated chest tightness. She does note some shortness of breath. . A 2-D echo Doppler study on 10/05/2012 showed normal systolic function with an ejection fraction of 55-60% with mild tissue Doppler abnormality. Her aortic valve was trileaflet and sclerotic with very mild stenosis with a mean gradient of 10 and a peak gradient of 20 mm respectively. There is trivial AR. She also had posterior calcified mitral annulus with mild MR.  A nuclear perfusion study in 2014 revealed mild breast attenuation but otherwise normal perfusion.   She underwent permanent pacemaker insertion by Dr. Sharrell Ku.  She also has had issues with kidney stones and underwent 2 urological procedures in June and July 2016 on her left kidney by Dr. Wilkie Aye.  She developed atrial fibrillation in March and again in June 2017 which occurred at night after drinking cold juice.  She does snore.  She was hospitalized overnight after she presented with AF with a rapid ventricular rate at 129.  She was started on IV diltiazem with ultimate improvement and eventually her oral dose of Cardizem was increased.  Troponins were negative.  An echo during that hospitalization showed an EF of 60-65%.  There was normal wall motion.  There was mild aortic valve stenosis and she had a mean gradient of 10 and peak gradient of 20 with a valve area of 1.35.  She was seen in follow-up by  Corine Shelter and was remaining stable.  She saw Dr. Ladona Ridgel on 12/04/2015 and had normal pacemaker function and was maintaining normal sinus rhythm over 99% of the time on her current dose of amiodarone 200 mg.    With her cardiovascular comorbidities and recurrent AF  I recommended that she undergo a sleep study to evaluate for sleep apnea.  This was done on 01/22/2016 and revealed moderate sleep apnea overall within AHI 15.4.  However, sleep apnea was severe during rems sleep at 36 per hour.  Her oxygen nadir was 83%.  She was scheduled to undergo a CPAP titration, but she canceled this appointment in January due to some other conflict.  When I last saw her, she had not yet had her CPAP titration.  I strongly recommended that she have this done.  He Pap titration was done on 07/30/2016 and she was titrated up to 15 cm water pressure.  She had a significant PLMS index of 85.6.  She has been on gabapentin for neuropathy, which will also treat restless legs.  She underwent an echo Doppler study on 07/03/2016 which showed an EF of 60-65%.  She had grade 2 diastolic dysfunction.  She had an upper normal to mildly increased.  The ascending aorta and a thickened mitral valve suggesting moderate stenosis.  There was a mild gradient across her aortic valve.  She had developed mild edema on diltiazem, which improved once this was discontinued.  She denies chest pain.  She continues to take  eliquis for anticoagulation and continues to be on amiodarone 200 mg for her PAF.   She received a new CPAP ResMed AirSense 10 AutoSet unit; setup was 09/30/2016.  She has a ResMed airFit 20 medium size mask.  A download was obtained from August 26 through 12/08/2016.  She is meeting compliance standards and had 93%.  Days of usage and 87% of days with usage greater than 4 hours.  However, she was only averaging 5 hours of sleep per night.  She is set at a 15 cm water pressure.  Her AHI is excellent at 0.1.  Used her machine were  recently due to the fact that she has a broken tooth.  This had resulted in significant teeth pain during the night with her unit on going into this region.  She was recently started on antibiotics by a dentist and will need tooth extraction.  She is sleeping well with her CPAP.  She denies breakthrough snoring.  She denies daytime sleepiness.    She developed an episode of head numbness with lightheadedness and dizziness which resulted in presenting to the emergency room in January 2019.  However, after waiting 4 hours in the waiting room after her blood work was obtained, she ultimately left.  She was seen in the office in follow-up by Micah Flesher, PA on April 21, 2017.  At that time her pacemaker was interrogated and showed adequate battery life with normal pacemaker function.  She was atrially paced 99% of the time.  There were no episodes of atrial tachycardia or atrial fibrillation.  She saw Dr. Sharrell Ku for follow-up evaluation and remote pacemaker device check from July 13, 2017 was normal.  She has a history of mixed hyperlipidemia and has had  issues with statin intolerance.  Laboratory in February 2019 showed a total cholesterol to 441, HDL 50, LDL 140, triglycerides 255.  VLDL was 51.  She was seen by Raquel Mia Creek and she was given a trial of pravastatin 20 mg to take in addition to her fenofibrate and omega-3 3 fatty acid.    Since I saw her in May 2019, she has had some issues with bronchitis.  She states that she believes she may be at times retaining some fluid.  She has been using sodium and typically add salt to tomatoes as well as apples.  She also eats food with high sodium content.  She denies any chest pressure.  She is unaware of palpitations.  She had undergone repeat laboratory since the initiation of pravastatin which showed significant improvement in her LDL cholesterol from 140 to 95 but LDL was still increased.  Triglycerides remained elevated at 237 with the LDL  at 47.    I saw her in September 2019.  Bood pressure was improved on furosemide 40 mg daily, losartan 50 mg twice a day and Toprol-XL 100 mg.  Pravastatin was further titrated to 40 mg and she continued to be on Zetia with an LDL of 95.  She was maintaining sinus rhythm on amiodarone on Monday and Saturday and was on Eliquis for anticoagulation.  She continued to use CPAP with 100% compliance.  Amiodarone was stopped due to abnormal pulmonary function tests in October 2019 suggesting severe restrictive physiology including possible pulmonary fibrosis.  She saw Azalee Course, [place] in follow-up in January 2020.  She underwent a telemedicine visit with Dr. Ladona Ridgel on June 09, 2018 and was maintaining sinus rhythm.  I saw her on September 15, 2018 at which time  she felt well.  She underwent bilateral cataract surgery on June 8 and June 22.  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  She has had issues with ankle edema that has been mild as well as peripheral neuropathy.  She  had lab work done by her primary physician Dr. Charm Barges.  She was not very compliant with CPAP although her AHI was excellent at 0.2/h at 15 cm water pressure.  Again stressed the importance of continued CPAP therapy particularly with her history of atrial fibrillation and cardiovascular comorbidities.  She was hospitalized in September 2020 with atrial fibrillation with RVR treated with diltiazem drip transition to oral diltiazem.  During that hospitalization diltiazem dose was increased to 480 mg daily.  I saw her on March 02, 2019.  She was seen by pulmonary on March 01, 2019 and was felt to be in atrial fibrillation with heart rate at 130.  She was added onto my schedule  She was unaware of any atrial fibrillation today and her heart rate is improved.  She denied chest tightness or pressure.  She denied wheezing.   During that evaluation, her initial ECG revealed atrial fibrillation with a ventricular rate in the 80s but a subsequent ECG suggested possible junctional rhythm with a heart rate in the 60s.  Subsequently, she was evaluated by Dr. Ladona Ridgel and underwent AV node ablation in January 2021 for uncontrolled atrial fibrillation.  She had successful weight loss.  She was seen by him in follow-up in February 2021 and her atrial fibrillation rate was well controlled status post AV node ablation.  Her Medtronic DDD permanent pacemaker was working normally.  Her blood pressure was mildly elevated and she was encouraged to reduce her sodium intake.  She was seen by Azalee Course, PA in a telemedicine visit in April 2021.  She denied any chest pain.  Her blood pressure was stable.  I saw her on February 13, 2020 at which time she denied any chest pain or palpitations.  She is to undergo laboratory at [facility] later this week and she sees Dr. Dimas Aguas to as her primary physician.  She continues to undergo remote pacemaker checks at 11-month intervals.  She has continued to be on losartan 50 mg, furosemide 40 mg twice a day and metoprolol succinate 100 mg daily with rate control of her AF.  She is on Eliquis 5 mg twice a day for anticoagulation.  She is on fenofibrate 160 mg for her mixed hyperlipidemia and is on metformin 1000 mg twice a day for her diabetes mellitus.  She no longer uses CPAP therapy.  During that evaluation, her blood pressure was stable on therapy and she continued to be on Eliquis without bleeding.  I last saw her on February 25, 2021 and over the prior year she was experiencing increased fatigue.  She also notes occasional leg swelling.  She continues to undergo remote pacemaker checks at 56-month intervals and is followed by Dr. Ladona Ridgel.  She had seen Dr. Narda Bonds in March 2022 for bilateral impacted cerumen and sensorineural hearing loss.  She continues to be on metoprolol succinate1 00 mg,  losartan 50 mg, furosemide 40 mg twice a day and has a prescription for metolazone 2.5 mg to take as needed for her edema.  She is anticoagulated on Eliquis.  She is diabetic on Humulin insulin and metformin.    Since I last  saw her, she has had numerous evaluations in our office with the EP as well as Juanda Crumble, PA-C.  She has maintained atrial fibrillation with controlled ventricular rate and is status post ablation.  She has undergone Medtronic DDD permanent pacemaker monitoring.  She no longer uses CPAP and is on supplemental oxygen for sleep.  She had undergone right heart catheterization in December 2023 which showed mean PA pressures in the mid 30s with wedge of 18 and preserved cardiac output and index.  She is followed by Dr. Vilma Meckel of pulmonary.  There was concern for possible sarcoidosis she last saw Dr. Sharrell Ku in July 2024.  She has stage IV CKD followed by Dr. Malen Gauze.  She is in need of samples for Eliquis.  She has been taking furosemide 40 mg twice a day and losartan 50 mg 1 time a day and metoprolol succinate which she takes 50 mg in the morning.  She has a prescription for metolazone 2.5 mg to take as needed.  She is on Farxiga 10 mg daily and insulin for her diabetes.  Past Medical History:  Diagnosis Date   Asthmatic bronchitis    CHF (congestive heart failure) (HCC) dx'd 2016   Coronary atherosclerosis of native coronary artery    a. s/p CABG 2005 Dr Cornelius Moras     Hyperlipidemia    Hypertension    Hypertensive heart disease    Kidney stones    Mixed hyperlipidemia    Nephrolithiasis 06/07/2015   Paroxysmal atrial fibrillation (HCC)    a. s/p Maze in 2005 b. recurrence in March 2016 with >8sec posttermination pauses s/p PPM implant     Pneumonia 1950   "double"   Presence of permanent cardiac pacemaker    Restrictive lung disease 03/03/2018   Sleep apnea    "tested; mask ordered; couldn't afford it; will get it now" (09/04/2015)   TIA (transient ischemic  attack)    Type II diabetes mellitus (HCC)    Uterine cancer [facility])    age 54 with partial hysterectomy    Past Surgical History:  Procedure Laterality Date   AV NODE ABLATION N/A 03/28/2019   Procedure: AV NODE ABLATION;  Surgeon: Marinus Maw, MD;  Location: [facility] INVASIVE CV LAB;  Service: Cardiovascular;  Laterality: N/A;   CARDIAC CATHETERIZATION  2005   CORONARY ARTERY BYPASS GRAFT  2005   Dr Cornelius Moras with MAZE   CYSTOSCOPY WITH URETEROSCOPY, STONE BASKETRY AND STENT PLACEMENT Left 09/08/2014   Procedure: CYSTOSCOPY WITH URETEROSCOPY, STONE BASKETRY AND STENT PLACEMENT;  Surgeon: Malen Gauze, MD;  Location: [facility] ORS;  Service: Urology;  Laterality: Left;   CYSTOSCOPY/URETEROSCOPY/HOLMIUM LASER/STENT PLACEMENT Left 09/15/2014   Procedure: CYSTOSCOPY/RETROGRADE/URETEROSCOPY/HOLMIUM LASER/ STONE EXTRACTION /STENT EXCHANGE;  Surgeon: Malen Gauze, MD;  Location: [facility];  Service: Urology;  Laterality: Left;   HOLMIUM LASER APPLICATION Left 09/08/2014   Procedure: HOLMIUM LASER APPLICATION;  Surgeon: Malen Gauze, MD;  Location: [facility];  Service: Urology;  Laterality: Left;   HOLMIUM LASER APPLICATION Left 09/15/2014   Procedure: HOLMIUM LASER APPLICATION;  Surgeon: Malen Gauze, MD;  Location: [facility];  Service: Urology;  Laterality: Left;   INSERT / REPLACE / REMOVE PACEMAKER     LAPAROSCOPIC CHOLECYSTECTOMY     PERMANENT PACEMAKER INSERTION N/A 06/05/2014   MDT 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     B Complex-Biotin-FA (B-COMPLEX PO) Take 1 capsule by mouth every morning.      Biotin 1000 MCG tablet Take 1,000 mcg by mouth daily.     Coenzyme Q10 (CO Q-10) 200 MG CAPS Take 200 mg by mouth daily.     ELIQUIS 5 MG TABS tablet TAKE ONE TABLET TWICE DAILY 60 tablet 5   FARXIGA 10 MG TABS tablet Take 10 mg by mouth daily.     fenofibrate 160 MG tablet Take 1 tablet (160 mg total) by mouth daily. Please keep scheduled appointment for future refills. Thank you. 30 tablet 1   furosemide (LASIX) 40 MG tablet TAKE (1) TABLET TWICE A DAY. 180 tablet 2   HUMALOG MIX 75/25 KWIKPEN (75-25) 100 UNIT/ML Kwikpen Inject 34-40 Units into the skin See admin instructions. Inject 40 units in the morning, 34 units midday, and 40 units in the evening     losartan (COZAAR) 50 MG tablet TAKE 1 TABLET UPTO TWICE A DAY AS DIRECTED 180 tablet 1   metolazone (ZAROXOLYN) 2.5 MG tablet Take 2.5 mg by mouth as needed.     metoprolol succinate (TOPROL-XL) 50 MG 24 hr tablet Take 1 tablet by mouth in the morning and 1/2 tablet by mouth in the evening.  Please keep scheduled appointment for future refills. Thank you. 45 tablet 1   Cholecalciferol (DIALYVITE VITAMIN D 5000) 125 MCG (5000 UT) capsule Take 5,000 Units by mouth 2 (two) times daily. (Patient not taking: Reported on 01/30/2023)     diphenhydramine-acetaminophen (TYLENOL PM) 25-500 MG TABS tablet Take 1 tablet by mouth at bedtime as needed (pain/sleep).  (Patient not taking: Reported on 01/30/2023)     loratadine (CLARITIN) 10 MG tablet Take 10 mg by mouth at bedtime as needed for allergies. (Patient not taking: Reported on 01/30/2023)     No current facility-administered medications for this visit.    Socially she is married to [employer]; she has 2 children one grandchild. She is retired. She completed ninth grade education. Has no history of tobacco use. She is not drink  alcohol. She does walk approximately 2-3 times per week.  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 Tonia Brooms Cardiovascular: He history of present illness GI: Negative; No nausea, vomiting, diarrhea, or abdominal pain GU: Positive for  left uteroscopic stone manipulation with laser lithotripsy for left kidney stone Musculoskeletal: Negative; no myalgias, joint pain,  or weakness Hematologic/Oncology: Negative; no easy bruising, bleeding Endocrine: Positive for diabetes mellitus Neuro: Positive for peripheral neuropathy Skin: Negative; No rashes or skin lesions Psychiatric: Negative; No behavioral problems, depression Sleep: Positive for OSA, no longer using CPAP therapy.  No daytime sleepiness and Epworth Sleepiness Scale score was calculated in the office today and this endorsed at 3 arguing against residual daytime sleepiness.   Other comprehensive 14 point system review is negative.   PE BP 130/74 (BP Location: Left Arm, Patient Position: Sitting, Cuff Size: Large)   Pulse 70   Ht 5\' 4"  (1.626 m)   Wt 229 lb 3.2 oz (104 kg)   SpO2 95%   BMI 39.34 kg/m    Repeat blood pressure by me was 126/70  Wt Readings from Last 3 Encounters:  01/30/23 229 lb 3.2 oz (104 kg)  10/14/22 229 lb (103.9 kg)  04/23/22 227 lb (103 kg)   General: Alert, oriented, no distress.  Skin: normal turgor, no rashes, warm and dry HEENT: Normocephalic, atraumatic.  Pupils equal round and reactive to light; sclera anicteric; extraocular muscles intact;  Nose without nasal septal hypertrophy Mouth/Parynx benign; Mallinpatti scale 3 Neck: No JVD, no carotid bruits; normal carotid upstroke Lungs: clear to ausculatation and percussion; no wheezing or rales Chest wall: without tenderness to palpitation Heart: PMI not displaced, RRR, s1 s2 normal, 1/6 systolic murmur, no diastolic murmur, no rubs, gallops, thrills, or heaves Abdomen: soft, nontender; no hepatosplenomehaly, BS+; abdominal aorta nontender and not dilated by palpation. Back: no CVA tenderness Pulses 2+ Musculoskeletal: full range of motion, normal strength, no joint deformities Extremities: Trivial ankle edema,no clubbing, cyanosis  Homan's sign negative  Neurologic: grossly nonfocal; Cranial nerves grossly wnl Psychologic: Normal mood and affect  January 30, 2023 ECG (independently read by me): Ventricular paced rhythm at 70 bpm.   February 25, 2021 ECG (independently read by me):  Ventricular paced at 73  February 13, 2020 ECG (independently read by me): V paced at 72.  03/02/2019 repeat ECG after prolonged auscultation suggested a regular rhythm: Possible accelerated junctional rhythm at 69 bpm.  Rhythm is regular.  No clear discernible P waves   Initial 03/02/2019 ECG (independently read by me): Atrial fibrillation at 85 bpm.  Rightward axis.  Nonspecific ST changes.  July 2020 ECG (independently read by me): Atrially paced rhythm at 85 bpm; prolonged AV conduction with PR interval 246 ms.  Nonspecific ST changes seconds  September 2018 ECG (independently read by me): Atrially paced at 84 bpm.  Incomplete right bundle branch block.  Anterolateral ST abnormality.  May 2019 ECG (independently read by me): Atrially paced rhythm with ventricular rate at 67 bpm.  Prolonged AV conduction with PR interval 272 ms.  QTC prolongation 4 9 6  ms.  Nonspecific ST-T changes.  September 2018 ECG  (independently read by me): Atrially paced rhythm at 83 bpm with prolonged AV conduction with a PR interval 284 ms.  QTc interval 484 ms.  Nonspecific ST changes.  March 2018 ECG (independently read by me): Atrially paced rhythm at 81 bpm.  Prolonged AV conduction with a PR interval at 258 ms.  QTc interval 473 ms.  Nonspecific ST-T changes.  September 2017 ECG (independently read by me): Atrially paced rhythm with a PR interval at 248.  Complete right bundle branch block.  QTc interval 458 ms.  March 2017 ECG (independently read by me):  Atrially paced rhythm with prolonged AV conduction with PR interval 232 ms.  Nonspecific ST-T changes.  September 2016  ECG (independently read by me): A paced rhythm at 63 with prolonged AV conduction with a PR 214.  QTC normal at 446 ms.  July 2015 ECG (independently read by me): Sinus rhythm with short PR 88 ms.  Nonspecific T changes.  Incomplete right bundle branch block  10/2013 ECG: Sinus rhythm at 69 beats per minute. QTc interval 445 ms.  LABS:    Latest Ref Rng & Units 04/09/2022   11:08 AM 03/24/2022   11:40 AM 02/26/2022   12:45 PM  BMP  Glucose 70 - 99 mg/dL 960  454    BUN 8 - 23 mg/dL 47  49    Creatinine 0.98 - 1.00 mg/dL 1.19  1.47    Sodium 829 - 145 mmol/L 135  133  136   Potassium 3.5 - 5.1 mmol/L 4.7  3.7  3.3   Chloride 98 - 111 mmol/L 87  87    CO2 22 - 32 mmol/L 34  34    Calcium 8.9 - 10.3 mg/dL 9.9  9.4        Latest Ref Rng & Units 12/11/2018    2:17 AM 03/19/2018    9:31 AM 10/20/2017   10:09 AM  Hepatic Function  Total Protein 6.5 - 8.1 g/dL 7.1  7.0  7.0   Albumin 3.5 - 5.0 g/dL 3.7  4.5  4.3   AST 15 - 41 U/L 50  40  53   ALT 0 - 44 U/L 24  23  27    Alk Phosphatase 38 - 126 U/L 55  50  46   Total Bilirubin 0.3 - 1.2 mg/dL 1.0  0.3  0.4       Latest Ref Rng & Units 02/26/2022   12:45 PM 02/26/2022   12:42 PM 02/26/2022   12:41 PM  CBC  Hemoglobin 12.0 - 15.0 g/dL 56.2  13.0  86.5   Hematocrit 36.0 - 46.0 % 31.0   32.0  33.0    Lab Results  Component Value Date   MCV 88.0 02/10/2022   MCV 88 03/24/2019   MCV 94.0 12/12/2018   Lab Results  Component Value Date   TSH 2.088 09/05/2015   Lab Results  Component Value Date   HGBA1C 10.9 (H) 12/09/2018   Lipid Panel     Component Value Date/Time   CHOL 188 03/19/2018 0931   TRIG 228 (H) 03/19/2018 0931   HDL 48 03/19/2018 0931   CHOLHDL 3.9 03/19/2018 0931   CHOLHDL 4.7 09/08/2016 0910   VLDL 42 (H) 09/08/2016 0910   LDLCALC 94 03/19/2018 0931   Laboratory from Dr. Haskel Khan office from   February 22,017. Lipid studies revealed a total cholesterol 186, LDL cholesterol 80, triglycerides 333 and VLDL cholesterol 67. Her glucose was 283.   Laboratory from Dr. Haskel Khan office from 11/27/2014: Fasting glucose 220, BUN 16, creatinine 0.66.  CO2 30.5.  Hemoglobin 12.1, hematocrit 37.3.  Hemoglobin A1c 9.4.  Total cholesterol 204, triglycerides 261, HDL 44, LDL 108, VLDL 52.   RADIOLOGY: No results found.  IMPRESSION:  1. Paroxysmal atrial fibrillation [facility])      ASSESSMENT AND PLAN:   [name] is a 77 year-old female who has a history of type 2 diabetes mellitus and is status post CABG surgery x2 by Dr. Cornelius Moras in 2005. She has a history of atrial fibrillation and underwent a Maze procedure at the time of her surgery.  She is status post permanent pacemaker insertion by Dr. Ladona Ridgel.  She had  been on amiodarone which ultimately was weaned and DC'd due to concerns for amiodarone toxicity and interstitial lung disease.  She developed recurrent episodes of atrial fibrillation in all underwent AV node ablation by Dr. Ladona Ridgel in January 2021.  Her ventricular rate has been controlled.  She underwent right heart catheterization by Dr. Gasper Lloyd in December 2023 and had moderate pulmonary hypertension with RV pressure 57 over 6-9 and PA pressure 57/23 (mean 34.  Mean wedge pressure was 16 with large V waves up to 25 mm suggestive of mitral  regurgitation.  Her EKG today continues to show ventricular paced rhythm at 70 bpm.  She is followed by Dr. Ladona Ridgel with December 26, 2022 remote device check showing appropriate histograms in stable leads and battery.  She is followed by pulmonary with her pulmonary hypertension discussion has been made concerning possible trial of Tyvaso which has not been implemented.  She no longer uses CPAP and is on oxygen at night.  She continues to be anticoagulated on Eliquis.  She is diabetic on Farxiga and Humalog insulin.  Blood pressure today is stable on her current dose of furosemide, losartan and metoprolol.  Remotely, I had discussed possible carotid and upper extremity arterial Doppler evaluation due to prior blood pressure differential between both arms which apparently was never pursued.  She sees Dr. Link Snuffer for primary care.  I discussed with her my plans for retirement.  She will be following up with Dr. Ladona Ridgel.  Following my retirement I suggested she transition and see Dr. Duke Salvia.     Lennette Bihari, MD, [facility] 01/30/2023 11:07 AM

## 2023-01-30 NOTE — Patient Instructions (Addendum)
Medication Instructions:  Losartan 50 mg daily Metoprolol Succinate 50 mg daily *If you need a refill on your cardiac medications before your next appointment, please call your pharmacy*  Follow-Up: At [facility], you and your health needs are our priority.  As part of our continuing mission to provide you with exceptional heart care, we have created designated Provider Care Teams.  These Care Teams include your primary Cardiologist (physician) and Advanced Practice Providers (APPs -  Physician Assistants and Nurse Practitioners) who all work together to provide you with the care you need, when you need it.  We recommend signing up for the patient portal called "MyChart".  Sign up information is provided on this After Visit Summary.  MyChart is used to connect with patients for Virtual Visits (Telemedicine).  Patients are able to view lab/test results, encounter notes, upcoming appointments, etc.  Non-urgent messages can be sent to your provider as well.   To learn more about what you can do with MyChart, go to [email or web address].    Your next appointment:   1 year(s)  Provider:   Dr Chilton Si

## 2023-02-10 ENCOUNTER — Encounter: Payer: Self-pay | Admitting: Cardiovascular Disease

## 2023-02-25 ENCOUNTER — Other Ambulatory Visit: Payer: Self-pay | Admitting: Cardiovascular Disease

## 2023-03-09 ENCOUNTER — Telehealth: Payer: Self-pay | Admitting: Internal Medicine

## 2023-03-09 NOTE — Telephone Encounter (Signed)
Returned call, informed that we have not gotten a transmission  since October.  Advised to send in transmission today and we will go from there. Forwarding to device clinic to be on the look out for transmission

## 2023-03-09 NOTE — Telephone Encounter (Signed)
Daughter [facility]) called to get patient scheduled for pacemaker replacement.

## 2023-03-09 NOTE — Telephone Encounter (Signed)
Transmission received.  Device has not triggered RRT at this point, however RRT voltage and presenting voltage are 2.83V.  Returned call to daughter.  Advised device not yet RRT.  Per daughter, they do not keep monitor plugged in at bedside.  They only plug in monitor when sending transmissions.  Advised d/t device close to RRT to keep monitor plugged in at bedside.  Daughter indicates understanding.  Advised would continue to closely monitor.

## 2023-03-27 ENCOUNTER — Ambulatory Visit (INDEPENDENT_AMBULATORY_CARE_PROVIDER_SITE_OTHER): Payer: Medicare Other

## 2023-03-27 DIAGNOSIS — I480 Paroxysmal atrial fibrillation: Secondary | ICD-10-CM | POA: Diagnosis not present

## 2023-03-28 LAB — CUP PACEART REMOTE DEVICE CHECK
Battery Remaining Longevity: 1 mo
Battery Voltage: 2.83 V
Brady Statistic RA Percent Paced: 0 %
Brady Statistic RV Percent Paced: 97.99 %
Date Time Interrogation Session: 20250110113158
Implantable Lead Connection Status: 753985
Implantable Lead Connection Status: 753985
Implantable Lead Implant Date: 20160321
Implantable Lead Implant Date: 20160321
Implantable Lead Location: 753859
Implantable Lead Location: 753860
Implantable Lead Model: 5076
Implantable Lead Model: 5076
Implantable Pulse Generator Implant Date: 20160321
Lead Channel Impedance Value: 361 Ohm
Lead Channel Impedance Value: 437 Ohm
Lead Channel Impedance Value: 437 Ohm
Lead Channel Impedance Value: 475 Ohm
Lead Channel Pacing Threshold Amplitude: 0.625 V
Lead Channel Pacing Threshold Amplitude: 0.875 V
Lead Channel Pacing Threshold Pulse Width: 0.4 ms
Lead Channel Pacing Threshold Pulse Width: 0.4 ms
Lead Channel Sensing Intrinsic Amplitude: 0.375 mV
Lead Channel Sensing Intrinsic Amplitude: 0.375 mV
Lead Channel Sensing Intrinsic Amplitude: 5.875 mV
Lead Channel Sensing Intrinsic Amplitude: 5.875 mV
Lead Channel Setting Pacing Amplitude: 2.5 V
Lead Channel Setting Pacing Pulse Width: 0.4 ms
Lead Channel Setting Sensing Sensitivity: 4 mV
Zone Setting Status: 755011
Zone Setting Status: 755011

## 2023-03-29 ENCOUNTER — Telehealth: Payer: Self-pay

## 2023-03-29 NOTE — Telephone Encounter (Signed)
 Pacemaker battery reached RRT 03/26/23. Patient needs OV to discuss gen change.

## 2023-04-08 ENCOUNTER — Ambulatory Visit: Payer: Medicare Other | Admitting: Pulmonary Disease

## 2023-04-20 NOTE — Progress Notes (Signed)
  Electrophysiology Office Note:   Date:  04/21/2023  ID:  [name], DOB 03/26/1945, MRN [id number]  Primary Cardiologist: Debby Sor, MD Primary Heart Failure: None Electrophysiologist: Danelle Birmingham, MD       History of Present Illness:   [name] is a 78 y.o. female with h/o permanent AF s/p AV node ablation, SND s/p PPM seen today for routine electrophysiology followup.   The patient's PPM reached RRT as of 03/26/23.    Since last being seen in our clinic the patient reports she is doing ok. Took metolazone yesterday for swelling.   She denies chest pain, palpitations, dyspnea, PND, orthopnea, nausea, vomiting, dizziness, syncope, edema, weight gain, or early satiety.   Review of systems complete and found to be negative unless listed in HPI.   EP Information / Studies Reviewed:    EKG is not ordered today. EKG from 01/30/2023 reviewed which showed VP 70 bpm      PPM Interrogation-  reviewed in detail today,  See PACEART report.  Device History: Medtronic Dual Chamber PPM implanted 06/05/2014 for Sinus Node Dysfunction No R waves at 30 on 04/21/23   Risk Assessment/Calculations:    CHA2DS2-VASc Score = 5   This indicates a 7.2% annual risk of stroke. The patient's score is based upon: CHF History: 1 HTN History: 1 Diabetes History: 1 Stroke History: 0 Vascular Disease History: 0 Age Score: 2 Gender Score: 1             Physical Exam:   VS:  BP (!) 110/58 (BP Location: Right Arm, Patient Position: Sitting, Cuff Size: Large)   Pulse 72   Ht 5' 4 (1.626 m)   Wt 225 lb (102.1 kg)   SpO2 93%   BMI 38.62 kg/m    Wt Readings from Last 3 Encounters:  04/21/23 225 lb (102.1 kg)  01/30/23 229 lb 3.2 oz (104 kg)  10/14/22 229 lb (103.9 kg)     GEN: Well nourished, well developed in no acute distress NECK: No JVD; No carotid bruits CARDIAC: Regular rate and rhythm, no murmurs, rubs, gallops RESPIRATORY:  Clear to auscultation without rales, wheezing  or rhonchi  ABDOMEN: Soft, non-tender, non-distended EXTREMITIES:  LE 1+ pitting pre-tibial edema; No deformity   ASSESSMENT AND PLAN:    SND s/p Medtronic PPM  Uncontrolled Atrial Fibrillation s/p AV Node Ablation  -Normal PPM function -See Pace Art report -No changes today -continue Toprol  50 mg daily, ventricular rates well controlled   -generator change reviewed with patient & family in room   -pre-procedure labs > CMP, CBC, Mg+  -instructions given to patient for above   Secondary Hypercoagulable State  -continue Eliquis  5mg , dose reviewed and appropriate by age/wt -OAC for stroke prophylaxis   Hypertension  -well controlled on current regimen    Obesity  -slow gradual weight loss encouraged    Disposition:   Follow up with Dr. Birmingham  as planned for generator change   Signed, Daphne Barrack, NP-C, AGACNP-BC [facility] - Electrophysiology  04/21/2023, 12:43 PM

## 2023-04-21 ENCOUNTER — Ambulatory Visit: Payer: Medicare Other | Attending: Pulmonary Disease | Admitting: Pulmonary Disease

## 2023-04-21 ENCOUNTER — Other Ambulatory Visit: Payer: Self-pay | Admitting: *Deleted

## 2023-04-21 ENCOUNTER — Encounter: Payer: Self-pay | Admitting: Pulmonary Disease

## 2023-04-21 VITALS — BP 110/58 | HR 72 | Ht 64.0 in | Wt 225.0 lb

## 2023-04-21 DIAGNOSIS — I4821 Permanent atrial fibrillation: Secondary | ICD-10-CM

## 2023-04-21 DIAGNOSIS — Z950 Presence of cardiac pacemaker: Secondary | ICD-10-CM | POA: Diagnosis not present

## 2023-04-21 DIAGNOSIS — I10 Essential (primary) hypertension: Secondary | ICD-10-CM

## 2023-04-21 DIAGNOSIS — D6869 Other thrombophilia: Secondary | ICD-10-CM

## 2023-04-21 LAB — CUP PACEART INCLINIC DEVICE CHECK
Battery Remaining Longevity: 1 mo
Battery Voltage: 2.83 V
Brady Statistic RA Percent Paced: 0 %
Brady Statistic RV Percent Paced: 97.76 %
Date Time Interrogation Session: 20250204124620
Implantable Lead Connection Status: 753985
Implantable Lead Connection Status: 753985
Implantable Lead Implant Date: 20160321
Implantable Lead Implant Date: 20160321
Implantable Lead Location: 753859
Implantable Lead Location: 753860
Implantable Lead Model: 5076
Implantable Lead Model: 5076
Implantable Pulse Generator Implant Date: 20160321
Lead Channel Impedance Value: 380 Ohm
Lead Channel Impedance Value: 456 Ohm
Lead Channel Impedance Value: 456 Ohm
Lead Channel Impedance Value: 494 Ohm
Lead Channel Pacing Threshold Amplitude: 0.625 V
Lead Channel Pacing Threshold Amplitude: 0.875 V
Lead Channel Pacing Threshold Pulse Width: 0.4 ms
Lead Channel Pacing Threshold Pulse Width: 0.4 ms
Lead Channel Sensing Intrinsic Amplitude: 0.375 mV
Lead Channel Sensing Intrinsic Amplitude: 0.5 mV
Lead Channel Sensing Intrinsic Amplitude: 7.125 mV
Lead Channel Sensing Intrinsic Amplitude: 7.125 mV
Lead Channel Setting Pacing Amplitude: 2.5 V
Lead Channel Setting Pacing Pulse Width: 0.4 ms
Lead Channel Setting Sensing Sensitivity: 4 mV
Zone Setting Status: 755011
Zone Setting Status: 755011

## 2023-04-21 NOTE — Patient Instructions (Addendum)
Medication Instructions:  Your physician recommends that you continue on your current medications as directed. Please refer to the Current Medication list given to you today.  *If you need a refill on your cardiac medications before your next appointment, please call your pharmacy*  Lab Work: CMET, CBC, MAG-Within 30 days of procedure   You will need to go to any [facility] for these labs. There is a [facility] in our building on the 1st floor or you can call [phone] or visit [email or web address] to find a lab near you. - You do NOT need an appointment for this. You do NOT need to be fasting. We NO LONGER have a lab located in our office.  If you have labs (blood work) drawn today and your tests are completely normal, you will receive your results only by: MyChart Message (if you have MyChart) OR A paper copy in the mail If you have any lab test that is abnormal or we need to change your treatment, we will call you to review the results.   Testing/Procedures: See letter  Follow-Up: At [facility], you and your health needs are our priority.  As part of our continuing mission to provide you with exceptional heart care, we have created designated Provider Care Teams.  These Care Teams include your primary Cardiologist (physician) and Advanced Practice Providers (APPs -  Physician Assistants and Nurse Practitioners) who all work together to provide you with the care you need, when you need it.  Your next appointment:   Your follow up appointments will be scheduled for you and will print out on your discharge summary after your procedure.

## 2023-04-27 ENCOUNTER — Ambulatory Visit (INDEPENDENT_AMBULATORY_CARE_PROVIDER_SITE_OTHER): Payer: Medicare Other

## 2023-04-27 DIAGNOSIS — I495 Sick sinus syndrome: Secondary | ICD-10-CM

## 2023-04-29 ENCOUNTER — Telehealth (HOSPITAL_COMMUNITY): Payer: Self-pay

## 2023-04-29 NOTE — Telephone Encounter (Signed)
Spoke with patient's daughter Alvis Lemmings, to complete one month pre-procedure call.     New medical conditions? NO Recent hospitalizations or surgeries? NO Started any new medications? NO Patient made aware to contact office to inform of any new medications started. Any changes in activities of daily living? NO  Pre-procedure testing scheduled: Lab work ordered and aware to have drawn prior to procedure.    Confirmed patient is scheduled for  PPM generator change on Wednesday, March 12 with Dr. Lewayne Bunting. Instructed patient to arrive at the Main Entrance A at [facility]: [address] [place], [place] [address] and check in at Admitting at 8:30 AM.  Advised of plan to go home the same day and will only stay overnight if medically necessary. You MUST have a responsible adult to drive you home and MUST be with you the first 24 hours after you arrive home or your procedure could be cancelled.  Dawn verbalized understanding to information provided and is agreeable to proceed with procedure.

## 2023-04-30 LAB — CUP PACEART REMOTE DEVICE CHECK
Battery Remaining Longevity: 1 mo
Battery Voltage: 2.82 V
Brady Statistic RA Percent Paced: 0 %
Brady Statistic RV Percent Paced: 98.64 %
Date Time Interrogation Session: 20250212140558
Implantable Lead Connection Status: 753985
Implantable Lead Connection Status: 753985
Implantable Lead Implant Date: 20160321
Implantable Lead Implant Date: 20160321
Implantable Lead Location: 753859
Implantable Lead Location: 753860
Implantable Lead Model: 5076
Implantable Lead Model: 5076
Implantable Pulse Generator Implant Date: 20160321
Lead Channel Impedance Value: 361 Ohm
Lead Channel Impedance Value: 456 Ohm
Lead Channel Impedance Value: 456 Ohm
Lead Channel Impedance Value: 494 Ohm
Lead Channel Pacing Threshold Amplitude: 0.625 V
Lead Channel Pacing Threshold Amplitude: 0.875 V
Lead Channel Pacing Threshold Pulse Width: 0.4 ms
Lead Channel Pacing Threshold Pulse Width: 0.4 ms
Lead Channel Sensing Intrinsic Amplitude: 0.5 mV
Lead Channel Sensing Intrinsic Amplitude: 0.5 mV
Lead Channel Sensing Intrinsic Amplitude: 14.875 mV
Lead Channel Sensing Intrinsic Amplitude: 14.875 mV
Lead Channel Setting Pacing Amplitude: 2.5 V
Lead Channel Setting Pacing Pulse Width: 0.4 ms
Lead Channel Setting Sensing Sensitivity: 4 mV
Zone Setting Status: 755011
Zone Setting Status: 755011

## 2023-05-01 NOTE — Addendum Note (Signed)
Addended by: Elease Etienne A on: 05/01/2023 12:43 PM   Modules accepted: Orders

## 2023-05-01 NOTE — Progress Notes (Signed)
Remote pacemaker transmission.

## 2023-05-03 ENCOUNTER — Encounter: Payer: Self-pay | Admitting: Internal Medicine

## 2023-05-20 ENCOUNTER — Telehealth (HOSPITAL_COMMUNITY): Payer: Self-pay

## 2023-05-20 NOTE — Telephone Encounter (Signed)
 Attempted to reach patient to discuss upcoming procedure, no answer. Left VM for patient to return call.

## 2023-05-21 NOTE — Telephone Encounter (Signed)
 Received a return call from patient's daughter, Alvis Lemmings. Discussed upcoming procedure.   Confirmed patient is scheduled for a PPM generator change on Wednesday, March 12 with Dr. Lewayne Bunting. Instructed patient to arrive at the Main Entrance A at [facility]: [address] [place], [place] [address] and check in at Admitting at 9:00 AM.   Any recent signs of acute illness or been started on antibiotics?  Labs completed on today, 05/22/23  Any new medications started? No Diabetic medications to hold? Farxiga, Insulin Medication instructions:  On the morning of your procedure hold your Eliquis (Apixaban) for 2 day(s) prior to your procedure. Your last dose will be Sunday, March 9, PM dose. No eating or drinking after midnight prior to procedure.   The night before your procedure and the morning of your procedure scrub your neck/chest with the CHG surgical soap.   Advised of plan to go home the same day and will only stay overnight if medically necessary. You MUST have a responsible adult to drive you home and MUST be with you the first 24 hours after you arrive home.  Patient verbalized understanding to all instructions provided and agreed to proceed with procedure.

## 2023-05-22 LAB — CBC
Hematocrit: 34.9 % (ref 34.0–46.6)
Hemoglobin: 11.1 g/dL (ref 11.1–15.9)
MCH: 29.4 pg (ref 26.6–33.0)
MCHC: 31.8 g/dL (ref 31.5–35.7)
MCV: 92 fL (ref 79–97)
Platelets: 315 10*3/uL (ref 150–450)
RBC: 3.78 x10E6/uL (ref 3.77–5.28)
RDW: 13.5 % (ref 11.7–15.4)
WBC: 9 10*3/uL (ref 3.4–10.8)

## 2023-05-22 LAB — COMPREHENSIVE METABOLIC PANEL
ALT: 9 IU/L (ref 0–32)
AST: 22 IU/L (ref 0–40)
Albumin: 4.3 g/dL (ref 3.8–4.8)
Alkaline Phosphatase: 51 IU/L (ref 44–121)
BUN/Creatinine Ratio: 20 (ref 12–28)
BUN: 35 mg/dL — ABNORMAL HIGH (ref 8–27)
Bilirubin Total: 0.4 mg/dL (ref 0.0–1.2)
CO2: 33 mmol/L — ABNORMAL HIGH (ref 20–29)
Calcium: 10.4 mg/dL — ABNORMAL HIGH (ref 8.7–10.3)
Chloride: 93 mmol/L — ABNORMAL LOW (ref 96–106)
Creatinine, Ser: 1.76 mg/dL — ABNORMAL HIGH (ref 0.57–1.00)
Globulin, Total: 3.1 g/dL (ref 1.5–4.5)
Glucose: 159 mg/dL — ABNORMAL HIGH (ref 70–99)
Potassium: 5.3 mmol/L — ABNORMAL HIGH (ref 3.5–5.2)
Sodium: 142 mmol/L (ref 134–144)
Total Protein: 7.4 g/dL (ref 6.0–8.5)
eGFR: 29 mL/min/{1.73_m2} — ABNORMAL LOW (ref >59)

## 2023-05-22 LAB — MAGNESIUM: Magnesium: 2.3 mg/dL (ref 1.6–2.3)

## 2023-05-26 NOTE — Pre-Procedure Instructions (Signed)
 Attempted to call patient regarding procedure instructions.  Left voicemail on the following items: Arrival time 0900 Nothing to eat or drink after midnight No meds AM of procedure Responsible person to drive you home and stay with you for 24 hrs Wash with special soap night before and morning of procedure If on anti-coagulant drug instructions Eliquis- last dose Sunday 3/9.

## 2023-05-27 ENCOUNTER — Ambulatory Visit (HOSPITAL_COMMUNITY)
Admission: RE | Admit: 2023-05-27 | Discharge: 2023-05-27 | Disposition: A | Discharge status: Home / Self Care | Payer: Medicare Other | Attending: Internal Medicine | Admitting: Internal Medicine

## 2023-05-27 ENCOUNTER — Encounter (HOSPITAL_COMMUNITY)
Admission: RE | Disposition: A | Discharge status: Home / Self Care | Payer: Medicare Other | Source: Home / Self Care | Attending: Internal Medicine

## 2023-05-27 ENCOUNTER — Other Ambulatory Visit: Payer: Self-pay

## 2023-05-27 DIAGNOSIS — Z6839 Body mass index (BMI) 39.0-39.9, adult: Secondary | ICD-10-CM | POA: Insufficient documentation

## 2023-05-27 DIAGNOSIS — E669 Obesity, unspecified: Secondary | ICD-10-CM | POA: Diagnosis not present

## 2023-05-27 DIAGNOSIS — Z45010 Encounter for checking and testing of cardiac pacemaker pulse generator [battery]: Secondary | ICD-10-CM | POA: Diagnosis present

## 2023-05-27 DIAGNOSIS — I482 Chronic atrial fibrillation, unspecified: Secondary | ICD-10-CM | POA: Diagnosis not present

## 2023-05-27 DIAGNOSIS — I442 Atrioventricular block, complete: Secondary | ICD-10-CM | POA: Insufficient documentation

## 2023-05-27 DIAGNOSIS — Z79899 Other long term (current) drug therapy: Secondary | ICD-10-CM | POA: Insufficient documentation

## 2023-05-27 DIAGNOSIS — I509 Heart failure, unspecified: Secondary | ICD-10-CM | POA: Diagnosis not present

## 2023-05-27 DIAGNOSIS — I110 Hypertensive heart disease with heart failure: Secondary | ICD-10-CM | POA: Insufficient documentation

## 2023-05-27 HISTORY — PX: PPM GENERATOR CHANGEOUT: EP1233

## 2023-05-27 LAB — GLUCOSE, CAPILLARY
Glucose-Capillary: 153 mg/dL — ABNORMAL HIGH (ref 70–99)
Glucose-Capillary: 156 mg/dL — ABNORMAL HIGH (ref 70–99)

## 2023-05-27 SURGERY — PPM GENERATOR CHANGEOUT

## 2023-05-27 MED ORDER — ACETAMINOPHEN 325 MG PO TABS: 325.0000 mg | ORAL_TABLET | ORAL | Status: DC | PRN

## 2023-05-27 MED ORDER — SODIUM CHLORIDE 0.9 % IV SOLN: 80.0000 mg | INTRAVENOUS | Status: DC

## 2023-05-27 MED ORDER — CEFAZOLIN SODIUM-DEXTROSE 2-4 GM/100ML-% IV SOLN
INTRAVENOUS | Status: AC
  Administered 2023-05-27: 2 g via INTRAVENOUS
  Filled 2023-05-27: qty 100

## 2023-05-27 MED ORDER — CEFAZOLIN SODIUM-DEXTROSE 2-4 GM/100ML-% IV SOLN
2.0000 g | INTRAVENOUS | Status: AC
Start: 2023-05-27 — End: 2023-05-27

## 2023-05-27 MED ORDER — ONDANSETRON HCL 4 MG/2ML IJ SOLN: 4.0000 mg | Freq: Four times a day (QID) | INTRAMUSCULAR | Status: DC | PRN

## 2023-05-27 MED ORDER — CHLORHEXIDINE GLUCONATE 4 % EX SOLN: 4.0000 | Freq: Once | CUTANEOUS | Status: DC

## 2023-05-27 MED ORDER — SODIUM CHLORIDE 0.9 % IV SOLN
INTRAVENOUS | Status: DC
Start: 2023-05-27 — End: 2023-05-27

## 2023-05-27 MED ORDER — LIDOCAINE HCL (PF) 1 % IJ SOLN
INTRAMUSCULAR | Status: DC | PRN
  Administered 2023-05-27: 50 mL

## 2023-05-27 MED ORDER — LIDOCAINE HCL (PF) 1 % IJ SOLN
INTRAMUSCULAR | Status: AC
  Filled 2023-05-27: qty 60

## 2023-05-27 MED ORDER — SODIUM CHLORIDE 0.9 % IV SOLN
INTRAVENOUS | Status: AC
  Filled 2023-05-27: qty 2

## 2023-05-27 MED ORDER — POVIDONE-IODINE 10 % EX SWAB
2.0000 | Freq: Once | CUTANEOUS | Status: AC
  Administered 2023-05-27: 2 via TOPICAL

## 2023-05-27 SURGICAL SUPPLY — 7 items
CABLE SURGICAL S-101-97-12 (CABLE) ×1 IMPLANT
IPG PACE AZUR XT DR MRI W1DR01 (Pacemaker) IMPLANT
PACE AZURE XT DR MRI W1DR01 (Pacemaker) ×1 IMPLANT
PAD DEFIB RADIO PHYSIO CONN (PAD) ×1 IMPLANT
POUCH AIGIS-R ANTIBACT PPM (Mesh General) ×1 IMPLANT
POUCH AIGIS-R ANTIBACT PPM MED (Mesh General) IMPLANT
TRAY PACEMAKER INSERTION (PACKS) ×1 IMPLANT

## 2023-05-27 NOTE — Discharge Instructions (Addendum)
 Implantable Cardiac Device Battery Change, Care After  Restart eliquis on 3/16 with the Morning dose    This sheet gives you information about how to care for yourself after your procedure. Your health care provider may also give you more specific instructions. If you have problems or questions, contact your health care provider. What can I expect after the procedure? After your procedure, it is common to have: Pain or soreness at the site where the cardiac device was inserted. Swelling at the site where the cardiac device was inserted. You should received an information card for your new device in 4-8 weeks. Follow these instructions at home: Incision care  Keep the incision clean and dry. Do not take baths, swim, or use a hot tub until after your wound check.  Do not shower for at least 7 days, or as directed by your health care provider. Pat the area dry with a clean towel. Do not rub the area. This may cause bleeding. Follow instructions from your health care provider about how to take care of your incision. Make sure you: If your incision is sealed with Steri-strips or staples, you may shower 7 days after your procedure or when told by your provider. Do not remove the steri-strips or let the shower hit directly on your site. You may wash around your site with soap and water.  Check your incision area every day for signs of infection. Check for: More redness, swelling, or pain. More fluid or blood. Warmth. Pus or a bad smell. Activity Do not lift anything that is heavier than 10 lb (4.5 kg) until your health care provider says it is okay to do so. For the first week, or as long as told by your health care provider: Avoid lifting your affected arm higher than your shoulder. After 1 week, Be gentle when you move your arms over your head. It is okay to raise your arm to comb your hair. Avoid strenuous exercise. Ask your health care provider when it is okay to: Resume your normal  activities. Return to work or school. Resume sexual activity. Eating and drinking Eat a heart-healthy diet. This should include plenty of fresh fruits and vegetables, whole grains, low-fat dairy products, and lean protein like chicken and fish. Limit alcohol intake to no more than 1 drink a day for non-pregnant women and 2 drinks a day for men. One drink equals 12 oz of beer, 5 oz of wine, or 1 oz of hard liquor. Check ingredients and nutrition facts on packaged foods and beverages. Avoid the following types of food: Food that is high in salt (sodium). Food that is high in saturated fat, like full-fat dairy or red meat. Food that is high in trans fat, like fried food. Food and drinks that are high in sugar. Lifestyle Do not use any products that contain nicotine or tobacco, such as cigarettes and e-cigarettes. If you need help quitting, ask your health care provider. Take steps to manage and control your weight. Once cleared, get regular exercise. Aim for 150 minutes of moderate-intensity exercise (such as walking or yoga) or 75 minutes of vigorous exercise (such as running or swimming) each week. Manage other health problems, such as diabetes or high blood pressure. Ask your health care provider how you can manage these conditions. General instructions Do not drive for 24 hours after your procedure if you were given a medicine to help you relax (sedative). Take over-the-counter and prescription medicines only as told by your health care provider.  Avoid putting pressure on the area where the cardiac device was placed. If you need an MRI after your cardiac device has been placed, be sure to tell the health care provider who orders the MRI that you have a cardiac device. Avoid close and prolonged exposure to electrical devices that have strong magnetic fields. These include: Cell phones. Avoid keeping them in a pocket near the cardiac device, and try using the ear opposite the cardiac  device. MP3 players. Household appliances, like microwaves. Metal detectors. Electric generators. High-tension wires. Keep all follow-up visits as directed by your health care provider. This is important. Contact a health care provider if: You have pain at the incision site that is not relieved by over-the-counter or prescription medicines. You have any of these around your incision site or coming from it: More redness, swelling, or pain. Fluid or blood. Warmth to the touch. Pus or a bad smell. You have a fever. You feel brief, occasional palpitations, light-headedness, or any symptoms that you think might be related to your heart. Get help right away if: You experience chest pain that is different from the pain at the cardiac device site. You develop a red streak that extends above or below the incision site. You experience shortness of breath. You have palpitations or an irregular heartbeat. You have light-headedness that does not go away quickly. You faint or have dizzy spells. Your pulse suddenly drops or increases rapidly and does not return to normal. You begin to gain weight and your legs and ankles swell. Summary After your procedure, it is common to have pain, soreness, and some swelling where the cardiac device was inserted. Make sure to keep your incision clean and dry. Follow instructions from your health care provider about how to take care of your incision. Check your incision every day for signs of infection, such as more pain or swelling, pus or a bad smell, warmth, or leaking fluid and blood. Avoid strenuous exercise and lifting your left arm higher than your shoulder for 2 weeks, or as long as told by your health care provider. This information is not intended to replace advice given to you by your health care provider. Make sure you discuss any questions you have with your health care provider.

## 2023-05-27 NOTE — Progress Notes (Signed)
 Patient was given discharge instructions. She verbalized understanding.

## 2023-05-27 NOTE — H&P (Signed)
 HPI [name] returns today for followup after undergoing AV node ablation over one year ago for uncontrolled atrial fib. In the interim, she has done well with her HR well controlled. She denies palpitations/chest pain, or sob. No worsening edema. Her weight is up another 2 lbs since her last visit.  Allergies       Allergies  Allergen Reactions   Amiodarone         pulmonary fibrosis   Gabapentin        edema   Torsemide Nausea Only      Headache                Current Outpatient Medications  Medication Sig Dispense Refill   acetaminophen (TYLENOL) 500 MG tablet Take 500-1,000 mg by mouth every 6 (six) hours as needed for moderate pain.       B Complex-Biotin-FA (B-COMPLEX PO) Take 1 capsule by mouth every morning.        Biotin 1000 MCG tablet Take 1,000 mcg by mouth daily.       Cholecalciferol (DIALYVITE VITAMIN D 5000) 125 MCG (5000 UT) capsule Take 5,000 Units by mouth 2 (two) times daily.       Coenzyme Q10 (CO Q-10) 200 MG CAPS Take 200 mg by mouth daily.       diphenhydramine-acetaminophen (TYLENOL PM) 25-500 MG TABS tablet Take 1 tablet by mouth at bedtime as needed (pain/sleep).        ELIQUIS 5 MG TABS tablet TAKE ONE TABLET TWICE DAILY 60 tablet 5   FARXIGA 10 MG TABS tablet Take 10 mg by mouth daily.       fenofibrate 160 MG tablet TAKE 1 TABLET DAILY 90 tablet 3   furosemide (LASIX) 40 MG tablet TAKE (1) TABLET TWICE A DAY. 180 tablet 2   HUMALOG MIX 75/25 KWIKPEN (75-25) 100 UNIT/ML Kwikpen Inject 34-40 Units into the skin See admin instructions. Inject 40 units in the morning, 34 units midday, and 40 units in the evening       loratadine (CLARITIN) 10 MG tablet Take 10 mg by mouth at bedtime as needed for allergies.       losartan (COZAAR) 50 MG tablet TAKE 1 TABLET UPTO TWICE A DAY AS DIRECTED 180 tablet 1   metolazone (ZAROXOLYN) 2.5 MG tablet Take 2.5 mg by mouth as needed.       metoprolol succinate (TOPROL-XL) 50 MG 24 hr tablet TAKE 1 TABLET IN  THE MORNING AND 1/2 TABLET IN THE EVENING 135 tablet 0      No current facility-administered medications for this visit.              Past Medical History:  Diagnosis Date   Asthmatic bronchitis     CHF (congestive heart failure) (HCC) dx'd 2016   Coronary atherosclerosis of native coronary artery      a. s/p CABG 2005 Dr Cornelius Moras     Hyperlipidemia     Hypertension     Hypertensive heart disease     Kidney stones     Mixed hyperlipidemia     Nephrolithiasis 06/07/2015   Paroxysmal atrial fibrillation (HCC)      a. s/p Maze in 2005 b. recurrence in March 2016 with >8sec posttermination pauses s/p PPM implant     Pneumonia 1950    "double"   Presence of permanent cardiac pacemaker     Restrictive lung disease 03/03/2018   Sleep apnea      "  tested; mask ordered; couldn't afford it; will get it now" (09/04/2015)   TIA (transient ischemic attack)     Type II diabetes mellitus (HCC)     Uterine cancer [facility])      age 39 with partial hysterectomy          ROS:    All systems reviewed and negative except as noted in the HPI.          Past Surgical History:  Procedure Laterality Date   AV NODE ABLATION N/A 03/28/2019    Procedure: AV NODE ABLATION;  Surgeon: Marinus Maw, MD;  Location: [facility] INVASIVE CV [facility];  Service: Cardiovascular;  Laterality: N/A;   CARDIAC CATHETERIZATION   2005   CORONARY ARTERY BYPASS GRAFT   2005    Dr Cornelius Moras with MAZE   CYSTOSCOPY WITH URETEROSCOPY, STONE BASKETRY AND STENT PLACEMENT Left 09/08/2014    Procedure: CYSTOSCOPY WITH URETEROSCOPY, STONE BASKETRY AND STENT PLACEMENT;  Surgeon: Malen Gauze, MD;  Location: [facility] ORS;  Service: Urology;  Laterality: Left;   CYSTOSCOPY/URETEROSCOPY/HOLMIUM LASER/STENT PLACEMENT Left 09/15/2014    Procedure: CYSTOSCOPY/RETROGRADE/URETEROSCOPY/HOLMIUM LASER/ STONE EXTRACTION /STENT EXCHANGE;  Surgeon: Malen Gauze, MD;  Location: [facility];  Service: Urology;  Laterality: Left;   HOLMIUM LASER APPLICATION Left  09/08/2014    Procedure: HOLMIUM LASER APPLICATION;  Surgeon: Malen Gauze, MD;  Location: [facility];  Service: Urology;  Laterality: Left;   HOLMIUM LASER APPLICATION Left 09/15/2014    Procedure: HOLMIUM LASER APPLICATION;  Surgeon: Malen Gauze, MD;  Location: [facility];  Service: Urology;  Laterality: Left;   INSERT / REPLACE / REMOVE PACEMAKER       LAPAROSCOPIC CHOLECYSTECTOMY       PERMANENT PACEMAKER INSERTION N/A 06/05/2014    MDT 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   RIGHT HEART CATH N/A 02/26/2022    Procedure: RIGHT HEART CATH;  Surgeon: Dorthula Nettles, DO;  Location: [facility] CV [facility];  Service: Cardiovascular;  Laterality: N/A;   VAGINAL HYSTERECTOMY   1975                 Family History  Problem Relation Age of Onset   Hypertension Mother     Hyperlipidemia Mother     Heart disease Mother     Diabetes Father     Stroke Father     Heart disease Father     Hypertension Sister          3 sisters   Arthritis Brother     Heart attack Maternal Grandfather     Stroke Paternal Grandfather     Hypertension Daughter              Social History         Socioeconomic History   Marital status: Married      Spouse name: Not on file   Number of children: Not on file   Years of education: Not on file   Highest education level: Not on file  Occupational History   Not on file  Tobacco Use   Smoking status: Never   Smokeless tobacco: Never  Vaping Use   Vaping status: Never Used  Substance and Sexual Activity   Alcohol use: No   Drug use: No   Sexual activity: Not on file  Other Topics Concern   Not on file  Social History Narrative   Not on file    Social Determinants of Health  Financial Resource Strain: Low Risk  (12/09/2018)    Overall Financial Resource Strain (CARDIA)     Difficulty of Paying Living Expenses: Not hard at all  Food Insecurity: Unknown (12/09/2018)    Hunger Vital Sign     Worried  About Running Out of Food in the Last Year: Patient declined     Ran Out of Food in the Last Year: Patient declined  Transportation Needs: Unknown (12/09/2018)    PRAPARE - Therapist, art (Medical): Patient declined     Lack of Transportation (Non-Medical): Patient declined  Physical Activity: Not on file  Stress: Not on file  Social Connections: Not on file  Intimate Partner Violence: Unknown (12/09/2018)    Humiliation, Afraid, Rape, and Kick questionnaire     Fear of Current or Ex-Partner: Patient declined     Emotionally Abused: Patient declined     Physically Abused: Patient declined     Sexually Abused: Patient declined        BP 132/68   Pulse 74   Ht 5\' 4"  (1.626 m)   Wt 229 lb (103.9 kg)   SpO2 96%   BMI 39.31 kg/m    Physical Exam:   Well appearing NAD HEENT: Unremarkable Neck:  No JVD, no thyromegally Lymphatics:  No adenopathy Back:  No CVA tenderness Lungs:  Clear with no wheezes HEART:  Regular rate rhythm, no murmurs, no rubs, no clicks Abd:  soft, positive bowel sounds, no organomegally, no rebound, no guarding Ext:  2 plus pulses, no edema, no cyanosis, no clubbing Skin:  No rashes no nodules Neuro:  CN II through XII intact, motor grossly intact   EKG - NR with PVC's and ventricular pacing   DEVICE  Normal device function.  See PaceArt for details.    Assess/Plan: 1. Atrial fib - her VR is now well controlled, s/p AV node ablation.  2. PPM - her Medtronic DDD PM is working normally. We will recheck in several months. 3. HTN - her bp is minimally elevated. She is encouraged to reduce her sodium intake. 4. Obesity - she has lost 40 lbs but gained a bunch back and I have encouraged her to work on weight loss. The goal would be to get under 200 lbs.   Dana Gowda Alizae Bechtel,MD

## 2023-05-28 ENCOUNTER — Encounter (HOSPITAL_COMMUNITY): Payer: Self-pay | Admitting: Internal Medicine

## 2023-06-01 NOTE — Addendum Note (Signed)
 Addended by: Geralyn Flash D on: 06/01/2023 04:59 PM   Modules accepted: Orders, Level of Service

## 2023-06-01 NOTE — Progress Notes (Signed)
 Remote pacemaker transmission.

## 2023-06-07 ENCOUNTER — Encounter: Payer: Self-pay | Admitting: Pulmonary Disease

## 2023-06-10 ENCOUNTER — Ambulatory Visit

## 2023-06-11 ENCOUNTER — Ambulatory Visit: Attending: Cardiology

## 2023-06-11 DIAGNOSIS — I442 Atrioventricular block, complete: Secondary | ICD-10-CM

## 2023-06-11 LAB — CUP PACEART INCLINIC DEVICE CHECK
Date Time Interrogation Session: 20250327165302
Implantable Lead Connection Status: 753985
Implantable Lead Connection Status: 753985
Implantable Lead Implant Date: 20160321
Implantable Lead Implant Date: 20160321
Implantable Lead Location: 753859
Implantable Lead Location: 753860
Implantable Lead Model: 5076
Implantable Lead Model: 5076
Implantable Pulse Generator Implant Date: 20250312

## 2023-06-11 NOTE — Patient Instructions (Addendum)
 STOP taking losartan RESTART your Toprol 50 mg one tablet by mouth daily. Take your blood pressure every day and keep a log.  Take your blood pressure mid morning after your AM  meds.  After Your Pacemaker   Monitor your pacemaker site for redness, swelling, and drainage. Call the device clinic at [phone] if you experience these symptoms or fever/chills.  Your incision was closed with Steri-strips or staples:  You may shower 7 days after your procedure and wash your incision with soap and water. Avoid lotions, ointments, or perfumes over your incision until it is well-healed.  You may use a hot tub or a pool after your wound check appointment if the incision is completely closed.  There are no restrictions in arm movement after your wound check appointment.  You may drive, unless driving has been restricted by your healthcare providers.  Remote monitoring is used to monitor your pacemaker from home. This monitoring is scheduled every 91 days by our office. It allows [place] to keep an eye on the functioning of your device to ensure it is working properly. You will routinely see your Electrophysiologist annually (more often if necessary).

## 2023-06-11 NOTE — Progress Notes (Signed)
 Normal dual chamber pacemaker wound check. Presenting rhythm: VP-63 . Wound well healed. Routine testing performed. Thresholds, sensing, and impedances consistent with implant measurements and at 3.5V safety margin/auto capture until 3 month visit. No episodes. Reviewed arm restrictions to continue for 6 weeks total post op.  Pt enrolled in remote follow-up.  Patient advised she has not taken her fluid pill as prescribed for ~ 1 week as she is tired of having to get up and go to the restroom.  She does have bilateral lower extremity edema (2+) today.  She also reports low blood pressures at home. She is currently taking losartan, but not taking metoprolol, Device interrogation today shows brief episodes of NSVT.   The patient and her daughter, have been advised to: 1) hold losartan for now 2) resume metoprolol succinate 3) resume diuretics as prescribed 4) weigh at home daily  5) check BP at home daily  6) bring readings to her next in office appointment  The patient has an appointment scheduled to see Dr. Duke Salvia on 08/18/23.  They are advised to call the office with non-improving or worsening symptoms.   The patient and her daughter voice understanding and are agreeable.

## 2023-06-23 ENCOUNTER — Telehealth: Payer: Self-pay | Admitting: Pulmonary Disease

## 2023-06-23 NOTE — Telephone Encounter (Signed)
 cmn received from [facility] for oxygen concentrator.

## 2023-06-24 NOTE — Telephone Encounter (Signed)
 CMN faxed successfully and signed.

## 2023-07-20 IMAGING — CT CT CHEST HIGH RESOLUTION
2 of 9 series · 14 of 36 positions shown, 17 images · non-contrast
Comparison: 12/11/2018.

CLINICAL DATA: Chronic lung disease. Amiodarone therapy. Chronic
shortness of breath with exertion.



[Series 5: high resolution · axial · 0.66mm/px · z∈[-322,-90]mm · 11 of 280 slices shown, 14 images]
[im 24/280  mediastinal]
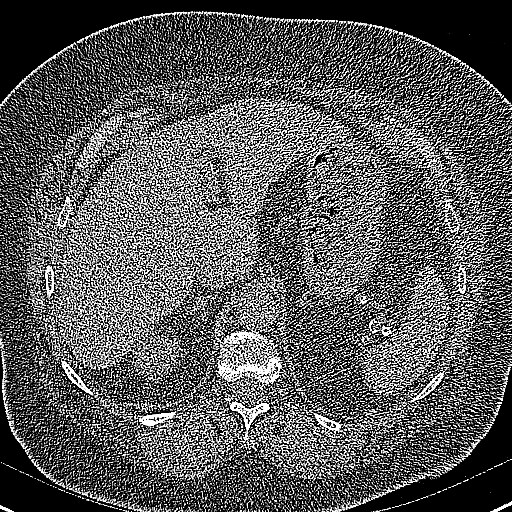
[im 24/280  lung]
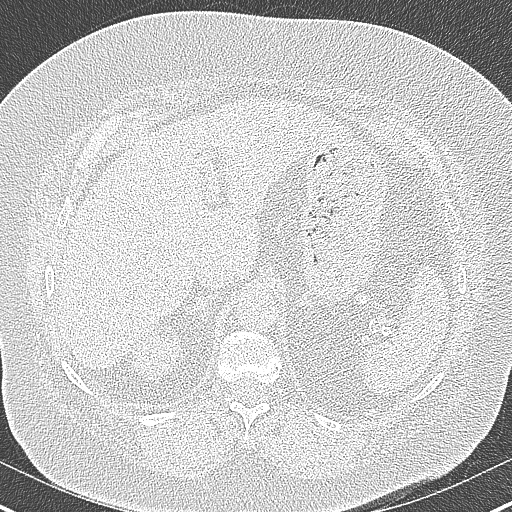
[im 47/280  lung]
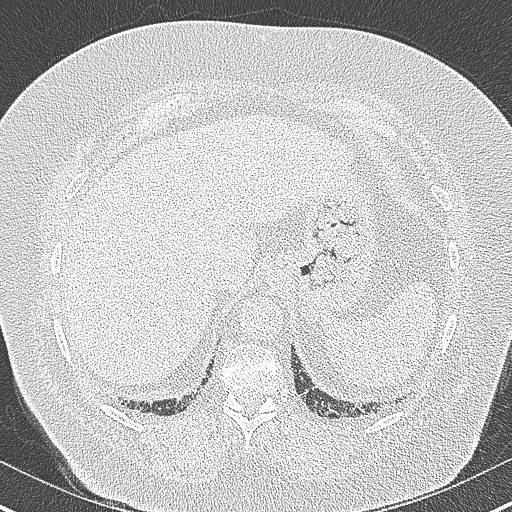
[im 70/280  lung]
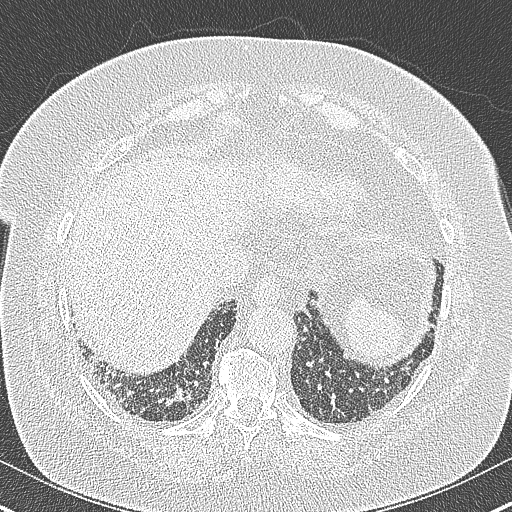
[im 94/280  lung]
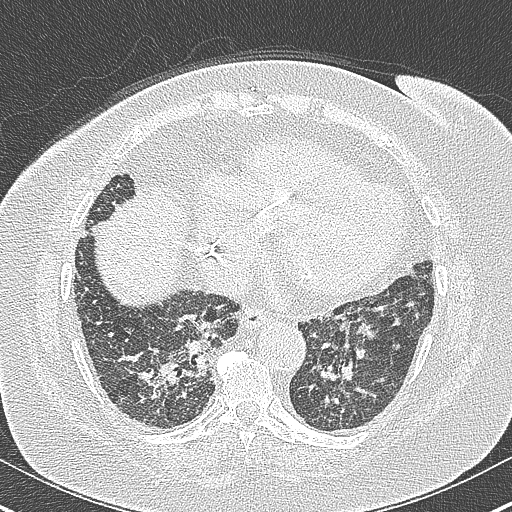
[im 117/280  mediastinal]
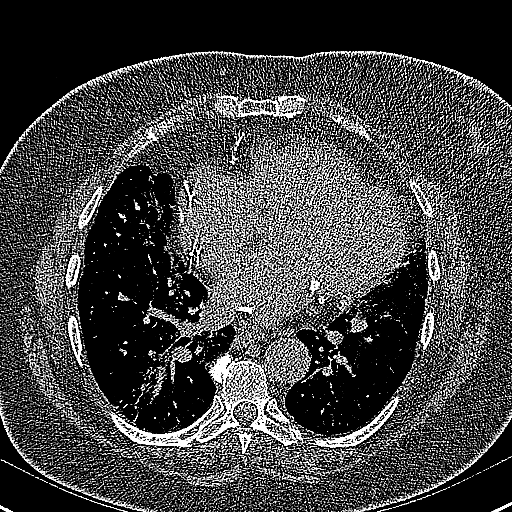
[im 117/280  lung]
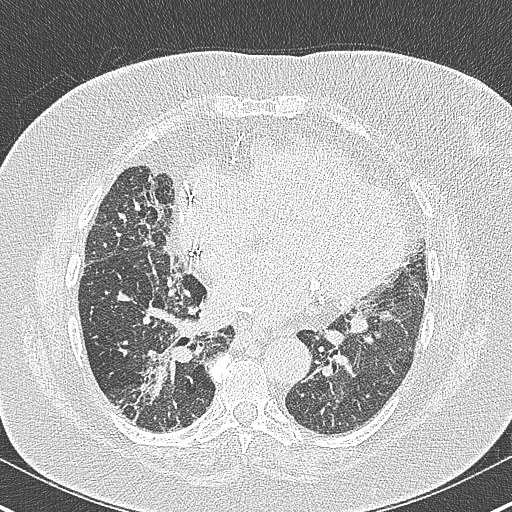
[im 140/280  lung]
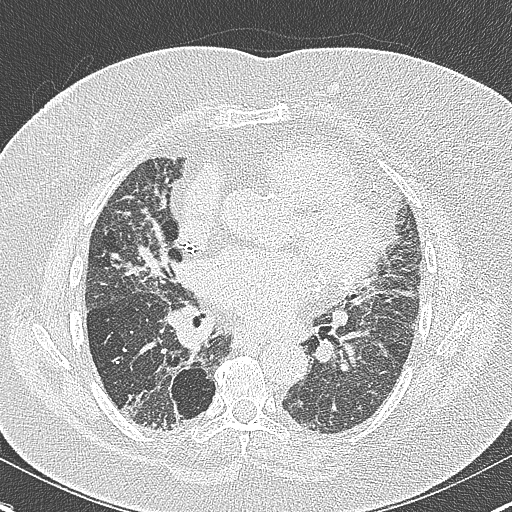
[im 163/280  lung]
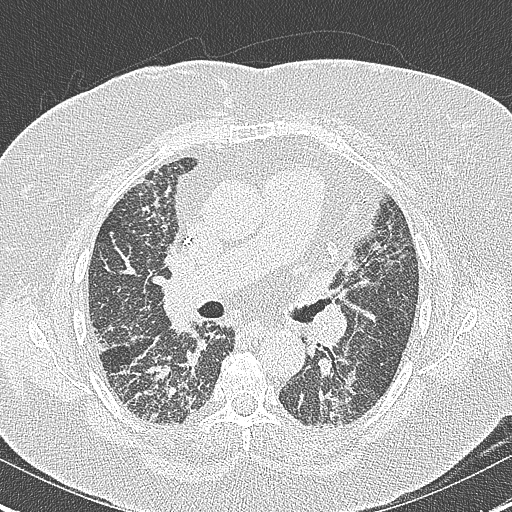
[im 187/280  lung]
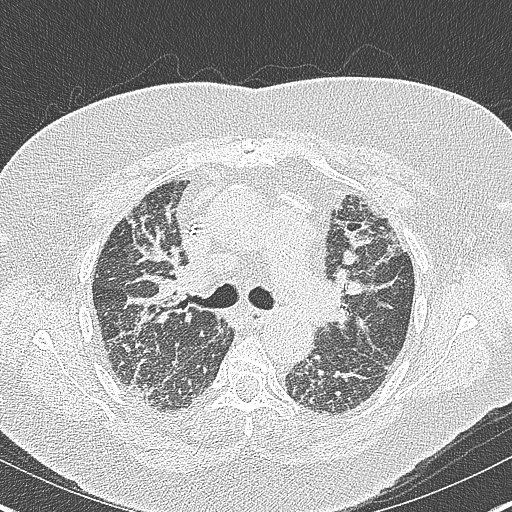
[im 210/280  mediastinal]
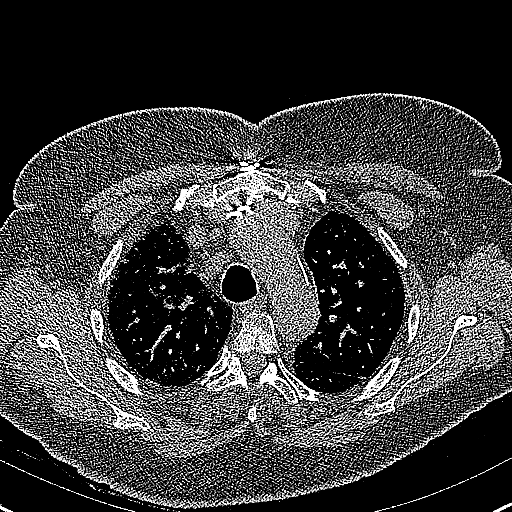
[im 210/280  lung]
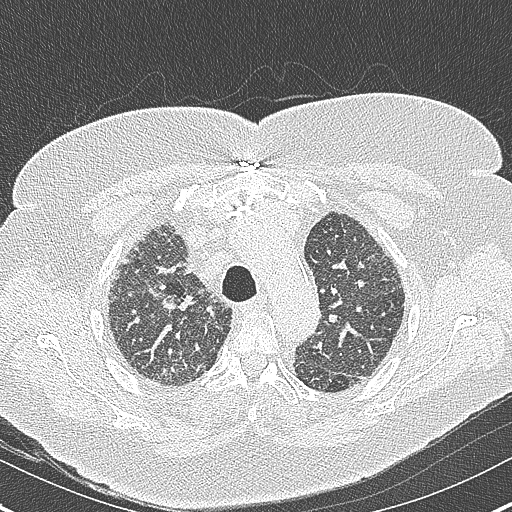
[im 233/280  lung]
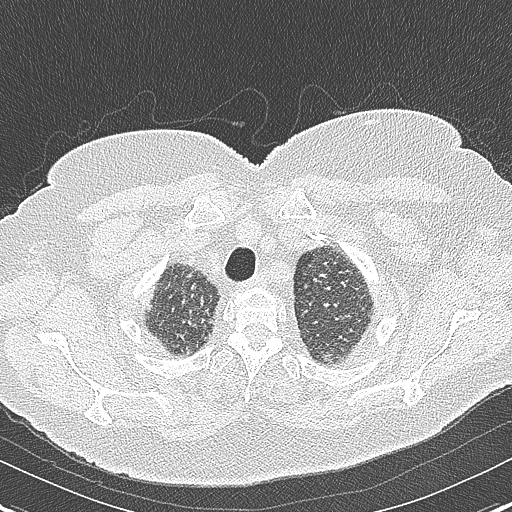
[im 256/280  lung]
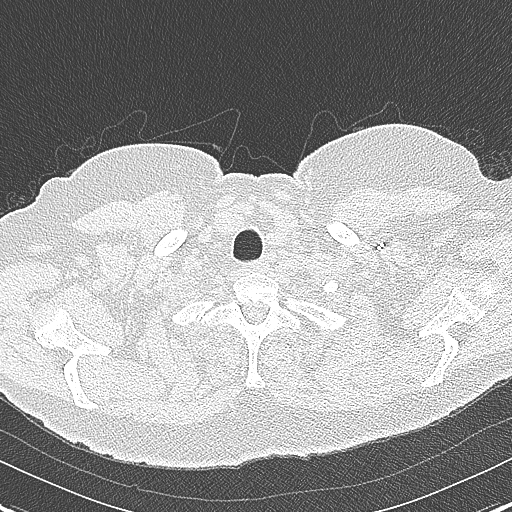

[Series 7: coronal · coronal · 0.59mm/px · 3 of 131 slices shown]
[im 27/131  lung]
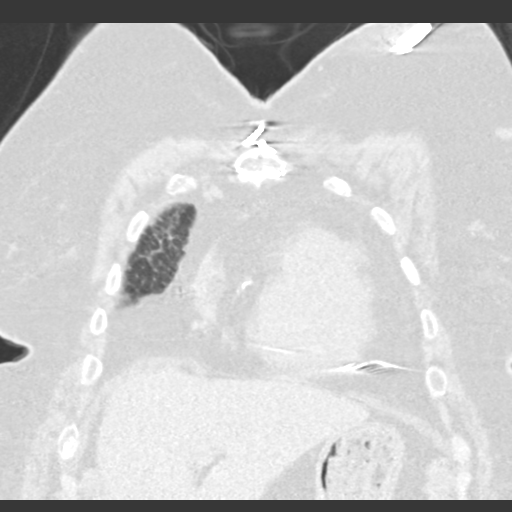
[im 53/131  lung]
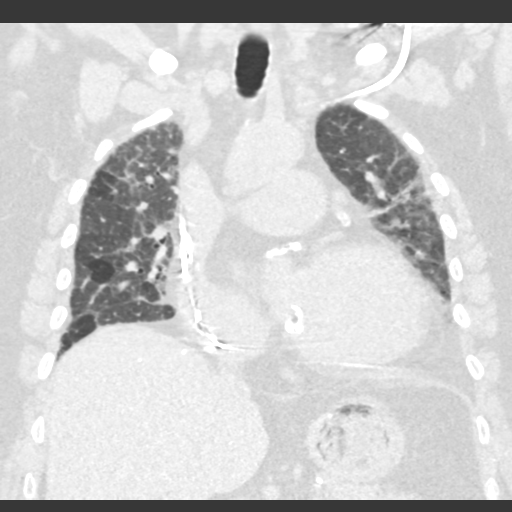
[im 79/131  lung]
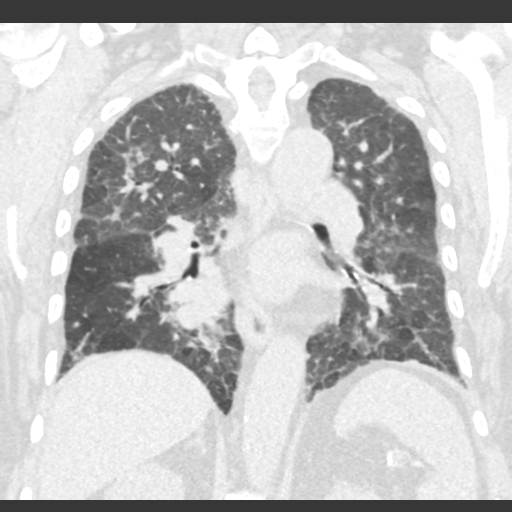

[14 of 36 positions shown; findings below may reference images not displayed]

FINDINGS: Cardiovascular: Atherosclerotic calcification of the aorta, aortic
valve and coronary arteries. Pulmonic trunk and heart are enlarged.
No pericardial effusion.

Mediastinum/Nodes: Mediastinal lymph nodes measure up to 1.7 cm in
the low right paratracheal station, as before. Hilar regions are
difficult to evaluate without IV contrast. No axillary adenopathy.
Esophagus is grossly unremarkable.

Lungs/Pleura: Coarsened peribronchovascular nodularity ground-glass
and nodularity along the fissures. Associated air trapping. Findings
have progressed significantly from 12/11/2018. A few scattered
pulmonary nodules measure up to 5 mm in the peripheral right lower
lobe (3/74), unchanged and benign. No pleural fluid. Airway is
unremarkable.

Upper Abdomen: Visualized portions of the liver, adrenal glands,
kidneys, spleen, pancreas, stomach and bowel are grossly
unremarkable. No upper abdominal adenopathy.

Musculoskeletal: Degenerative changes in the spine. Incidental note
is made of bilateral cervical ribs. No worrisome lytic or sclerotic
lesions.
IMPRESSION: 1. Pulmonary parenchymal pattern of interstitial lung disease is
progressive and may be due to fibrotic hypersensitivity pneumonitis.
However, given the perilymphatic distribution of nodularity and
mediastinal adenopathy, sarcoid is also considered. Findings are
suggestive of an alternative diagnosis (not UIP) per consensus
guidelines: Diagnosis of Idiopathic Pulmonary Fibrosis: An Official
ATS/ERS/JRS/ALAT Clinical Practice Guideline. Am J Respir Crit Care
Med Vol 198, Awa 5, [id number][DATE].
2. Aortic atherosclerosis (C5UI5-ET5.5). Coronary artery
calcification.
3. Enlarged pulmonic trunk, indicative of pulmonary arterial
hypertension.

## 2023-07-27 ENCOUNTER — Telehealth: Payer: Self-pay | Admitting: Internal Medicine

## 2023-07-27 ENCOUNTER — Telehealth: Payer: Self-pay

## 2023-07-27 ENCOUNTER — Encounter: Payer: Self-pay | Admitting: Internal Medicine

## 2023-07-27 NOTE — Telephone Encounter (Signed)
 Copied from CRM [id number]. Topic: Clinical - Home Health Verbal Orders >> Jul 24, 2023 10:59 AM Margarette Shawl wrote: Caller/Agency: [facility] Callback Number: [phone], FX # [phone] Service Requested: Rep is contacting clinic to verify if the request for new order and most recent OV notes was received for oxygen  concentrator(stationary/portable). Reviewed chart and advised was received and sent back. He is requesting the order and recent OV be sent again.   Any new concerns about the patient? No   Spoke with [employer]. CMN was uploaded into pts inactive account as accident. They are switching file to be uploaded in patients active account. They have order for O2. Nothing further needed.

## 2023-07-27 NOTE — Telephone Encounter (Signed)
 Pt is requesting a callback regarding MyChart message sent today "Hey Dr Carolynne Citron my incision has opened up from the pacemaker surgery done on March 12. It's in the left corner closest to my arm. No drainage just pulled open. Covered with bandage to keep clean. Not sure what to do. If you could have someone contact me I would appreciate it. [phone]. DOB 1945-04-30 [name]" Please advise.

## 2023-07-27 NOTE — Telephone Encounter (Signed)
 See separate phone encounter addressing. Patient coming in to be seen 5/14 to assess.

## 2023-07-27 NOTE — Telephone Encounter (Signed)
 Spoke with patient's daughter.  Over weekend while giving mom a bath, she realized small area opened up to distal end of incision line.  Scant amount of bloody drainage noted.  No current redness, warmth or signs of active infection at the site per daughter.  She is going to cleanse the area 1-2 times daily with Dial Soap and water, cover with bandaid and monitor for s/s of infection/fever, chills until appointment in Device Clinic on Wednesday 5/14 at 4pm.  She could not bring patient in sooner. She will call us  if any concerning symptoms develop before.

## 2023-07-29 ENCOUNTER — Ambulatory Visit: Attending: Cardiology

## 2023-07-29 DIAGNOSIS — I442 Atrioventricular block, complete: Secondary | ICD-10-CM

## 2023-07-29 NOTE — Progress Notes (Signed)
 Wound recheck in clinc. It has closed since last seen. One little spot that's still delicate but it healing. Ollis NP in room to evaluate. Agreed that it is healed. No s/s infection. Normal color. CDI. Educated on wound care. No further interventions at this time. Pt told to call clinic with any changes or signs of infection.

## 2023-08-03 ENCOUNTER — Other Ambulatory Visit: Payer: Self-pay | Admitting: Cardiovascular Disease

## 2023-08-03 DIAGNOSIS — I480 Paroxysmal atrial fibrillation: Secondary | ICD-10-CM

## 2023-08-18 ENCOUNTER — Ambulatory Visit (HOSPITAL_BASED_OUTPATIENT_CLINIC_OR_DEPARTMENT_OTHER): Admitting: Cardiovascular Disease

## 2023-09-01 ENCOUNTER — Ambulatory Visit (INDEPENDENT_AMBULATORY_CARE_PROVIDER_SITE_OTHER)

## 2023-09-01 ENCOUNTER — Telehealth: Payer: Self-pay | Admitting: Internal Medicine

## 2023-09-01 DIAGNOSIS — I442 Atrioventricular block, complete: Secondary | ICD-10-CM

## 2023-09-01 NOTE — Telephone Encounter (Signed)
 Returned call to Pt's daughter.  Advised transmission was not received early this morning as scheduled.  Attempted to manually send transmission.  Monitor turned orange.  Advised to unplug monitor and then plug back in.  Advised to wait 10 minutes and try to push button again.  Per daughter, family has poor cell service at their residence.    Pt has appointment with Dr. Carolynne Citron tomorrow 09/02/2023.  Will determine plan of care for monitoring.

## 2023-09-01 NOTE — Telephone Encounter (Signed)
  1. Has your device fired? No  2. Is you device beeping? No  3. Are you experiencing draining or swelling at device site? No  4. Are you calling to see if we received your device transmission? Yes, pt's daughter states she rec'vd a text that transmission not rec'vd but light is green  5. Have you passed out? No   Please route to Device Clinic Pool

## 2023-09-02 ENCOUNTER — Ambulatory Visit: Attending: Internal Medicine | Admitting: Internal Medicine

## 2023-09-02 ENCOUNTER — Encounter: Payer: Self-pay | Admitting: Internal Medicine

## 2023-09-02 VITALS — BP 152/62 | HR 60 | Ht 63.0 in | Wt 214.0 lb

## 2023-09-02 DIAGNOSIS — I4821 Permanent atrial fibrillation: Secondary | ICD-10-CM | POA: Diagnosis not present

## 2023-09-02 DIAGNOSIS — I442 Atrioventricular block, complete: Secondary | ICD-10-CM

## 2023-09-02 LAB — CUP PACEART INCLINIC DEVICE CHECK
Battery Remaining Longevity: 160 mo
Battery Voltage: 3.22 V
Brady Statistic AP VP Percent: 0 %
Brady Statistic AP VS Percent: 0 %
Brady Statistic AS VP Percent: 95.18 %
Brady Statistic AS VS Percent: 4.82 %
Brady Statistic RA Percent Paced: 0 %
Brady Statistic RV Percent Paced: 95.18 %
Date Time Interrogation Session: 20250618151742
Implantable Lead Connection Status: 753985
Implantable Lead Connection Status: 753985
Implantable Lead Implant Date: 20160321
Implantable Lead Implant Date: 20160321
Implantable Lead Location: 753859
Implantable Lead Location: 753860
Implantable Lead Model: 5076
Implantable Lead Model: 5076
Implantable Pulse Generator Implant Date: 20250312
Lead Channel Impedance Value: 399 Ohm
Lead Channel Impedance Value: 513 Ohm
Lead Channel Impedance Value: 513 Ohm
Lead Channel Impedance Value: 570 Ohm
Lead Channel Pacing Threshold Amplitude: 0.75 V
Lead Channel Pacing Threshold Pulse Width: 0.4 ms
Lead Channel Sensing Intrinsic Amplitude: 0.375 mV
Lead Channel Sensing Intrinsic Amplitude: 13.125 mV
Lead Channel Sensing Intrinsic Amplitude: 13.125 mV
Lead Channel Setting Pacing Amplitude: 2 V
Lead Channel Setting Pacing Pulse Width: 0.4 ms
Lead Channel Setting Sensing Sensitivity: 1.2 mV
Zone Setting Status: 755011

## 2023-09-02 LAB — CUP PACEART REMOTE DEVICE CHECK
Battery Remaining Longevity: 159 mo
Battery Voltage: 3.22 V
Brady Statistic AP VP Percent: 0 %
Brady Statistic AP VS Percent: 0 %
Brady Statistic AS VP Percent: 95.14 %
Brady Statistic AS VS Percent: 4.86 %
Brady Statistic RA Percent Paced: 0 %
Brady Statistic RV Percent Paced: 95.14 %
Date Time Interrogation Session: 20250617134257
Implantable Lead Connection Status: 753985
Implantable Lead Connection Status: 753985
Implantable Lead Implant Date: 20160321
Implantable Lead Implant Date: 20160321
Implantable Lead Location: 753859
Implantable Lead Location: 753860
Implantable Lead Model: 5076
Implantable Lead Model: 5076
Implantable Pulse Generator Implant Date: 20250312
Lead Channel Impedance Value: 342 Ohm
Lead Channel Impedance Value: 475 Ohm
Lead Channel Impedance Value: 513 Ohm
Lead Channel Impedance Value: 551 Ohm
Lead Channel Pacing Threshold Amplitude: 0.75 V
Lead Channel Pacing Threshold Pulse Width: 0.4 ms
Lead Channel Sensing Intrinsic Amplitude: 0.375 mV
Lead Channel Sensing Intrinsic Amplitude: 13.125 mV
Lead Channel Sensing Intrinsic Amplitude: 13.125 mV
Lead Channel Setting Pacing Amplitude: 2 V
Lead Channel Setting Pacing Pulse Width: 0.4 ms
Lead Channel Setting Sensing Sensitivity: 1.2 mV
Zone Setting Status: 755011

## 2023-09-02 MED ORDER — APIXABAN 5 MG PO TABS: 5.0000 mg | ORAL_TABLET | Freq: Two times a day (BID) | ORAL | 0 refills | Status: DC

## 2023-09-02 NOTE — Patient Instructions (Signed)
 Medication Instructions:  Your physician recommends that you continue on your current medications as directed. Please refer to the Current Medication list given to you today.  *If you need a refill on your cardiac medications before your next appointment, please call your pharmacy*  Lab Work: None ordered.  You may go to any [facility] Location for your lab work:  [facility] - [address] ([facility]) - [address] [id number]  [place] - [address]   [place]  - [address]   [place] - [address] - [address] [employer]  - [address] ([facility]   If you have labs (blood work) drawn today and your tests are completely normal, you will receive your results only by: [employer] (if you have MyChart)  If you have any lab test that is abnormal or we need to change your treatment, we will call you or send a MyChart message to review the results.  Testing/Procedures: None ordered.  Follow-Up: At [facility], you and your health needs are our priority.  As part of our continuing mission to provide you with exceptional heart care, we have created designated Provider Care Teams.  These Care Teams include your primary Cardiologist (physician) and Advanced Practice Providers (APPs -  Physician Assistants and Nurse Practitioners) who all work together to provide you with the care you need, when you need it.  Your next appointment:   1 year(s)  The format for your next appointment:   In Person  Provider:   Manya Sells, MD{or one of the following Advanced Practice Providers on your designated Care Team:   Mertha Abrahams, [place] Bambi Lever "Jonelle Neri" [place], [place] Neda Balk, NP  Note: Remote monitoring is used to monitor your Pacemaker/ ICD from home. This monitoring reduces the number of office visits required to check your  device to one time per year. It allows us  to keep an eye on the functioning of your device to ensure it is working properly.

## 2023-09-02 NOTE — Addendum Note (Signed)
 Addended by: Tomie Foyer A on: 09/02/2023 03:24 PM   Modules accepted: Orders

## 2023-09-02 NOTE — Progress Notes (Signed)
 HPI [name] returns today for followup after undergoing AV node ablation over 4 years ago for uncontrolled atrial fib. In the interim, she has done well with her HR well controlled. She denies palpitations/chest pain, or sob. No worsening edema. Her weight is down over 10 lbs since her last visit. 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  Allergies  Allergen Reactions   Amiodarone       pulmonary fibrosis   Gabapentin  Swelling    edema   Torsemide  Nausea Only    Headache     Current Outpatient Medications  Medication Sig Dispense Refill   apixaban  (ELIQUIS ) 5 MG TABS tablet TAKE ONE TABLET TWICE DAILY 180 tablet 3   B Complex-Biotin-FA (B-COMPLEX PO) Take 1 capsule by mouth every morning.      Biotin 1000 MCG tablet Take 1,000 mcg by mouth daily.     Coenzyme Q10 (CO Q-10) 200 MG CAPS Take 200 mg by mouth daily.     FARXIGA 10 MG TABS tablet Take 10 mg by mouth daily.     fenofibrate  160 MG tablet TAKE ONE TABLET DAILY 90 tablet 3   furosemide  (LASIX ) 40 MG tablet TAKE (1) TABLET TWICE A DAY. 180 tablet 2   insulin  NPH-regular Human (70-30) 100 UNIT/ML injection Inject 40 Units into the skin 3 (three) times daily with meals.     metolazone (ZAROXOLYN) 2.5 MG tablet Take 2.5 mg by mouth daily as needed (weight gain > 5lbs).     metoprolol  succinate (TOPROL -XL) 50 MG 24 hr tablet Take 1 tablet (50 mg total) by mouth daily. 90 tablet 3   OXYGEN  Inhale 2 L into the lungs continuous.     No current facility-administered medications for this visit.     Past Medical History:  Diagnosis Date   Asthmatic bronchitis    CHF (congestive heart failure) (HCC) dx'd 2016   Coronary atherosclerosis of native coronary artery    a. s/p CABG 2005 Dr Alva Jewels     Hyperlipidemia    Hypertension    Hypertensive heart disease    Kidney stones    Mixed hyperlipidemia    Nephrolithiasis 06/07/2015   Paroxysmal atrial fibrillation (HCC)    a.  s/p Maze in 2005 b. 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   Past Surgical History:  Procedure Laterality Date   AV NODE ABLATION N/A 03/28/2019   Procedure: AV NODE ABLATION;  Surgeon: Tammie Fall, MD;  Location: [facility] INVASIVE CV LAB;  Service: Cardiovascular;  Laterality: N/A;   CARDIAC CATHETERIZATION  2005   CORONARY ARTERY BYPASS GRAFT  2005   Dr Alva Jewels with MAZE   CYSTOSCOPY WITH URETEROSCOPY, STONE BASKETRY AND STENT PLACEMENT Left 09/08/2014   Procedure: CYSTOSCOPY WITH URETEROSCOPY, STONE BASKETRY AND STENT PLACEMENT;  Surgeon: Marco Severs, MD;  Location: [facility] ORS;  Service: Urology;  Laterality: Left;   CYSTOSCOPY/URETEROSCOPY/HOLMIUM LASER/STENT PLACEMENT Left 09/15/2014  Procedure: CYSTOSCOPY/RETROGRADE/URETEROSCOPY/HOLMIUM LASER/ STONE EXTRACTION /STENT EXCHANGE;  Surgeon: Marco Severs, MD;  Location: [facility] ORS;  Service: Urology;  Laterality: Left;   HOLMIUM LASER APPLICATION Left 09/08/2014   Procedure: HOLMIUM LASER APPLICATION;  Surgeon: Marco Severs, MD;  Location: [facility] ORS;  Service: Urology;  Laterality: Left;   HOLMIUM LASER APPLICATION Left 09/15/2014   Procedure: HOLMIUM LASER APPLICATION;  Surgeon: Marco Severs, MD;  Location: [facility] OR[facility];  Service: Urology;  Laterality: Left;   INSERT / REPLACE / REMOVE PACEMAKER     LAPAROSCOPIC CHOLECYSTECTOMY     PERMANENT PACEMAKER INSERTION N/A 06/05/2014   MDT Advisa 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   PPM GENERATOR CHANGEOUT N/A 05/27/2023    Procedure: PPM GENERATOR CHANGEOUT;  Surgeon: Tammie Fall, MD;  Location: [facility] INVASIVE CV LAB;  Service: Cardiovascular;  Laterality: N/A;   RIGHT HEART CATH N/A 02/26/2022   Procedure: RIGHT HEART CATH;  Surgeon: Alwin Baars, DO;  Location: [facility] INVASIVE CV LAB;  Service: Cardiovascular;  Laterality: N/A;   VAGINAL HYSTERECTOMY  1975     Family History  Problem Relation Age of Onset   Hypertension Mother    Hyperlipidemia Mother    Heart disease Mother    Diabetes Father    Stroke Father    Heart disease Father    Hypertension Sister        3 sisters   Arthritis Brother    Heart attack Maternal Grandfather    Stroke Paternal Grandfather    Hypertension Daughter      Social History   Socioeconomic History   Marital status: Married    Spouse name: Not on file   Number of children: Not on file   Years of education: Not on file   Highest education level: Not on file  Occupational History   Not on file  Tobacco Use   Smoking status: Never   Smokeless tobacco: Never  Vaping Use   Vaping status: Never Used  Substance and Sexual Activity   Alcohol  use: No   Drug use: No   Sexual activity: Not on file  Other Topics Concern   Not on file  Social History Narrative   Not on file   Social Drivers of Health   Financial Resource Strain: Low Risk  (12/09/2018)   Overall Financial Resource Strain (CARDIA)    Difficulty of Paying Living Expenses: Not hard at all  Food Insecurity: Unknown (12/09/2018)   Hunger Vital Sign    Worried About Running Out of Food in the Last Year: Patient declined    Ran Out of Food in the Last Year: Patient declined  Transportation Needs: Unknown (12/09/2018)   PRAPARE - Administrator, Civil Service (Medical): Patient declined    Lack of Transportation (Non-Medical): Patient declined  Physical Activity: Not on file  Stress: Not on file  Social Connections: Not on file  Intimate Partner Violence: Unknown (12/09/2018)    Humiliation, Afraid, Rape, and Kick questionnaire    Fear of Current or Ex-Partner: Patient declined    Emotionally Abused: Patient declined    Physically Abused: Patient declined    Sexually Abused: Patient declined     BP (!) 152/62   Pulse 60   Ht 5' 3 (1.6 m)   Wt 214 lb (97.1 kg)   SpO2 94%   BMI 37.91 kg/m   Physical Exam:  Well appearing NAD HEENT: Unremarkable Neck:  No JVD, no thyromegally Lymphatics:  No adenopathy  Back:  No CVA tenderness Lungs:  scattered rales but no increased work of breathing. HEART:  Regular rate rhythm, no murmurs, no rubs, no clicks Abd:  soft, positive bowel sounds, no organomegally, no rebound, no guarding Ext:  2 plus pulses, no edema, no cyanosis, no clubbing Skin:  No rashes no nodules Neuro:  CN II through XII intact, motor grossly intact  EKG - afib with ventricular pacing  DEVICE  Normal device function.  See PaceArt for details.   Assess/Plan:  Atrial fib - her VR is now well controlled, s/p AV node ablation.  2. PPM - her [employer] DDD PM is working normally. We will recheck in several months. 3. HTN - her bp is minimally elevated. She is encouraged to reduce her sodium intake. 4. Obesity - she has lost 10 lbs since her last visit and I have encouraged her to work on weight loss. The goal would be to get under 200 lbs. 5. Dyspnea - this is multifactorial. Her EF was normal by echo in the last year.   Pete Brand Lynnlee Revels,MD

## 2023-09-03 ENCOUNTER — Ambulatory Visit: Payer: Self-pay | Admitting: Internal Medicine

## 2023-09-04 LAB — LAB REPORT - SCANNED
Creatinine, POC: 72.5 mg/dL
EGFR: 44
PTH: 39

## 2023-09-23 ENCOUNTER — Encounter: Payer: Self-pay | Admitting: *Deleted

## 2023-09-25 ENCOUNTER — Encounter (HOSPITAL_BASED_OUTPATIENT_CLINIC_OR_DEPARTMENT_OTHER): Payer: Self-pay | Admitting: Cardiovascular Disease

## 2023-09-25 ENCOUNTER — Telehealth: Payer: Self-pay | Admitting: Pharmacy Technician

## 2023-09-25 ENCOUNTER — Ambulatory Visit (HOSPITAL_BASED_OUTPATIENT_CLINIC_OR_DEPARTMENT_OTHER): Admitting: Cardiovascular Disease

## 2023-09-25 VITALS — BP 134/62 | HR 51 | Ht 63.0 in | Wt 211.0 lb

## 2023-09-25 DIAGNOSIS — I4821 Permanent atrial fibrillation: Secondary | ICD-10-CM | POA: Diagnosis not present

## 2023-09-25 DIAGNOSIS — Z5181 Encounter for therapeutic drug level monitoring: Secondary | ICD-10-CM

## 2023-09-25 DIAGNOSIS — J849 Interstitial pulmonary disease, unspecified: Secondary | ICD-10-CM

## 2023-09-25 DIAGNOSIS — Z951 Presence of aortocoronary bypass graft: Secondary | ICD-10-CM

## 2023-09-25 DIAGNOSIS — I442 Atrioventricular block, complete: Secondary | ICD-10-CM

## 2023-09-25 DIAGNOSIS — I110 Hypertensive heart disease with heart failure: Secondary | ICD-10-CM

## 2023-09-25 DIAGNOSIS — I050 Rheumatic mitral stenosis: Secondary | ICD-10-CM

## 2023-09-25 DIAGNOSIS — E782 Mixed hyperlipidemia: Secondary | ICD-10-CM

## 2023-09-25 DIAGNOSIS — I4891 Unspecified atrial fibrillation: Secondary | ICD-10-CM

## 2023-09-25 MED ORDER — FUROSEMIDE 40 MG PO TABS: 40.0000 mg | ORAL_TABLET | Freq: Two times a day (BID) | ORAL | 3 refills | Status: AC

## 2023-09-25 MED ORDER — REPATHA SURECLICK 140 MG/ML ~~LOC~~ SOAJ: 140.0000 mg | SUBCUTANEOUS | 3 refills | Status: AC

## 2023-09-25 MED ORDER — METOPROLOL SUCCINATE ER 50 MG PO TB24: 50.0000 mg | ORAL_TABLET | Freq: Every day | ORAL | 3 refills | Status: AC

## 2023-09-25 NOTE — Progress Notes (Signed)
 Cardiology Office Note:  .   Date:  09/25/2023  ID:  [name], DOB [birth year], MRN [id number] PCP: Larnell Hamilton, MD  [facility] Providers Cardiologist:  Debby Sor, MD Cardiology APP:  Kathrine Jeoffrey POUR, NP (Inactive)  Electrophysiologist:  Danelle Birmingham, MD    History of Present Illness: .    [name] is a 78 y.o. female with CAD s/p CABG, afib s/p MAZE in 2005, DM, OSA on overnight oxygen , mild-moderate mitral stenosis, pulmonary hypertension, CKD IV, and hypertension here for follow up.  She was previously a patient of Dr. Sor.  She underwent CABG and MAZE with Dr. Dusty but continued to have episodes of atrial fibrillation.  Amiodarone  was stopped 2/2 concern for pulmonary fibrosis.   She underwent AV node ablation and PPM implantation with Dr. Birmingham 03/2019.  She had a RHC 02/2022 with revealed moderately elevated PCWP and large v waves suggestive of poor atrial compliance.    Discussed the use of AI scribe software for clinical note transcription with the patient, who gave verbal consent to proceed.  History of Present Illness [name] experiences shortness of breath when ambulating around the house, which she attributes to pain in her tailbone. The pain is alleviated by rubbing the area. She also experiences panic and sore breathing when in pain. No issues with hypotension are reported.  She has a history of pacemaker placement, which initially had complications due to a non-healing and draining site, but it has since resolved. She recalls previous episodes of arrhythmia.  She monitors her blood pressure at home, noting elevated systolic readings while diastolic pressure remains stable. She experienced a single episode of dizziness, which she attributes to not wearing her glasses, and notes that her oxygen  line was cramped during this episode.  She was previously on 50 mg of losartan , discontinued post-pacemaker procedure due to hypotension. Her kidney  function has improved from an eGFR of 29 to 44, which she attributes to dietary changes, including increased vegetable intake and reduced protein consumption.  She experiences swelling, particularly in one leg due to previously pulled ligaments. She takes furosemide , noting that two 20 mg pills are less effective than one 40 mg pill. She takes furosemide  twice daily.  She is on Eliquis  and [place]. She has a history of hyperlipidemia, with an LDL of 134 in October. She has tried various statins, including atorvastatin , but experienced myalgia. She is currently on fenofibrate , prescribed by Dr. Sor.  She mentions insurance issues with her diabetes supplies, having to switch from [employer] to [employer] for test strips.   ROS:  As per HPI  Studies Reviewed: .       Echo 02/2022:   1. Left ventricular ejection fraction, by estimation, is 55 to 60%. The  left ventricle has normal function. The left ventricle has no regional  wall motion abnormalities. There is mild asymmetric left ventricular  hypertrophy of the basal-septal segment.   2. Right ventricular systolic function was not well visualized. The right  ventricular size is not well visualized. There is mildly elevated  pulmonary artery systolic pressure. The estimated right ventricular  systolic pressure is 37.3 mmHg.   3. The mitral valve is degenerative. No evidence of mitral valve  regurgitation. Mild to moderate mitral stenosis. The mean mitral valve  gradient is 8.0 mmHg with average heart rate of 70 bpm.   4. The aortic valve is tricuspid. Aortic valve regurgitation is not  visualized. Aortic valve sclerosis is present, with no  evidence of aortic  valve stenosis.   5. The inferior vena cava is normal in size with greater than 50%  respiratory variability, suggesting right atrial pressure of 3 mmHg.   RHC 02/2022: HEMODYNAMICS: Significant respiratory variation, end - expiratory values used. RA:                  9 mmHg  (mean) RV:                  57/6-9 mmHg PA:                  57/23 mmHg (34 mean) PCWP:            16 with lage V waves up to                                      Estimated Fick CO/CI   6.3 L/min, 3.2 L/min/m2 Thermodilution CO/CI   5 L/min, 2.5 L/min/m2                                      TPG                 18  mmHg                                            PVR                 2.9-3.6 Wood Units  PAPi                3.8       IMPRESSION: Moderately elevated pre-capillary filling pressures and PA mean.  Mildly elevated post-capillary filling pressures.  Large V waves with no MR on TTE likely suggestive of poor left atrial compliance.  Normal cardiac output / cardiac index.  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    RECOMMENDATIONS: - Obtain TTE to evaluate for mitral regurgitation.    Risk Assessment/Calculations:    CHA2DS2-VASc Score = 5   This indicates a 7.2% annual risk of stroke. The patient's score is based upon: CHF History: 1 HTN History: 1 Diabetes History: 1 Stroke History: 0 Vascular Disease History: 0 Age Score: 2 Gender Score: 1            Physical Exam:   VS:  BP 134/62 (BP Location: Right Arm, Patient Position: Sitting, Cuff Size: Normal)   Pulse (!) 51   Ht 5' 3 (1.6 m)   Wt 211 lb (95.7 kg) Comment: self reported weight  SpO2 97%   BMI 37.38 kg/m  , BMI Body mass index is 37.38 kg/m. GENERAL:  Well appearing HEENT: Pupils equal round and reactive, fundi not visualized, oral mucosa unremarkable NECK:  No jugular venous distention, waveform within normal limits, carotid upstroke brisk and symmetric, no bruits, no thyromegaly LUNGS:  Clear to auscultation bilaterally HEART:  RRR.  PMI not displaced or sustained,S1 and S2 within normal limits, no S3, no S4, no clicks, no rubs, no murmurs ABD:  Flat, positive bowel sounds normal in frequency in pitch, no  bruits, no rebound, no guarding, no midline pulsatile mass, no hepatomegaly,  no splenomegaly EXT:  2 plus pulses throughout, no edema, no cyanosis no clubbing SKIN:  No rashes no nodules NEURO:  Cranial nerves II through XII grossly intact, motor grossly intact throughout PSYCH:  Cognitively intact, oriented to person place and time   ASSESSMENT AND PLAN: .    Assessment & Plan # Persistent Atrial Fibrillation s/p AV Node Ablation:  Atrial fibrillation managed with Eliquis . No recent cardiovascular-related dizziness or lightheadedness. - Continue Eliquis .  # Hypertension:  Hypertension with slightly elevated home blood pressure readings. Previously on losartan , now on metoprolol . - Monitor blood pressure at home twice daily for two weeks, report via MyChart. - Continue metoprolol .  # CAD s/p CABG:  # Hyperlipidemia: LDL at 134, above target of 70. Previous statin intolerance due to muscle pain. Transitioning to Repatha  for better efficacy and lower muscle-related side effects. - Discontinue fenofibrate . - Initiate Repatha . - Recheck cholesterol in 3-4 months.  # Chronic Kidney Disease, Stage 3:  Stage 3 CKD with improved kidney function from 29 to 44. Stabilized potassium at 3.4. Beneficial dietary changes with increased vegetables and reduced protein. - Continue dietary modifications with increased vegetables and reduced protein.         Dispo: f/u in   Signed, Annabella Scarce, MD

## 2023-09-25 NOTE — Patient Instructions (Addendum)
 Medication Instructions:  STOP FENOFIBRATE    START REPATHA  EVERY 14 DAYS   *If you need a refill on your cardiac medications before your next appointment, please call your pharmacy*  Lab Work: FASTING LP/CMET IN 3 TO 4 MONTHS ABOUT A WEEK PRIOR TO FOLLOW UP   If you have labs (blood work) drawn today and your tests are completely normal, you will receive your results only by: MyChart Message (if you have MyChart) OR A paper copy in the mail If you have any lab test that is abnormal or we need to change your treatment, we will call you to review the results.  Testing/Procedures: Your physician has requested that you have an echocardiogram. Echocardiography is a painless test that uses sound waves to create images of your heart. It provides your doctor with information about the size and shape of your heart and how well your heart's chambers and valves are working. This procedure takes approximately one hour. There are no restrictions for this procedure. Please do NOT wear cologne, perfume, aftershave, or lotions (deodorant is allowed). Please arrive 15 minutes prior to your appointment time.  Please note: We ask at that you not bring children with you during ultrasound (echo/ vascular) testing. Due to room size and safety concerns, children are not allowed in the ultrasound rooms during exams. Our front office staff cannot provide observation of children in our lobby area while testing is being conducted. An adult accompanying a patient to their appointment will only be allowed in the ultrasound room at the discretion of the ultrasound technician under special circumstances. We apologize for any inconvenience.   Follow-Up: At [facility], you and your health needs are our priority.  As part of our continuing mission to provide you with exceptional heart care, our providers are all part of one team.  This team includes your primary Cardiologist (physician) and Advanced Practice  Providers or APPs (Physician Assistants and Nurse Practitioners) who all work together to provide you with the care you need, when you need it.  Your next appointment:   3 TO 4 month(s)  Provider:   Annabella Scarce, MD, Rosaline Bane, NP, or Reche Finder, NP    We recommend signing up for the patient portal called MyChart.  Sign up information is provided on this After Visit Summary.  MyChart is used to connect with patients for Virtual Visits (Telemedicine).  Patients are able to view lab/test results, encounter notes, upcoming appointments, etc.  Non-urgent messages can be sent to your provider as well.   To learn more about what you can do with MyChart, go to [email or web address].   Other Instructions Subcutaneous Injection Instructions Using a Prefilled Syringe A subcutaneous injection is a shot of medicine that is given into the layer of fat and tissue between skin and muscle. The injection is given with a single-use syringe that is already filled with medicine. The syringe is called a prefilled syringe. Read the medicine guide or package insert that came with the syringe. Follow directions from the guide about how to prepare and give the injection. This is important because the directions may be different for each medicine. Use only the syringe, needle, and medicine that your health care provider prescribes. Use each prefilled syringe and needle only one time. Supplies needed: Prefilled syringe with needle. Use the needle length and size (gauge) that your provider or pharmacist gives to you. Alcohol  wipes. Gauze. Bandage. A container to put used syringes. This may be a sharps container or a  hard plastic container that has a secure lid, such as an empty laundry detergent bottle. How to choose a site for injection Follow instructions from your provider about where to give an injection. Do not inject in the same spot each time. There are five main areas that can be used for  injecting. These areas include: Abdomen. Avoid the area that is within 2 inches (5 cm) of your navel (umbilicus). Front of thigh. Upper, outer side of thigh. Upper, outer side of arm. Upper, outer part of butt. How to give an injection using a prefilled syringe  Wash your hands with soap and water. If soap and water are not available, use hand sanitizer. Use an alcohol  wipe to clean the site where you will be injecting the needle. Let the site air-dry. Remove the plastic cover from the needle on the syringe. Do not let the needle touch anything. Hold the syringe with the needle pointing up. Check the syringe for any remaining air bubbles. If there are air bubbles, flick the syringe with your finger until the air bubbles rise to the top. Then, gently push on the plunger until you can see a drop of medicine appear at the tip of the needle. This will clear any remaining air bubbles from the syringe. Hold the syringe in your writing hand like a pencil. Use your other hand to pinch and hold about an inch (2.5 cm) of skin. Do not directly touch the cleaned part of the skin. Gently but quickly, put the needle straight into the skin. The needle should be at a 90-degree angle to the skin. The needle may need to be injected at a 45-degree angle in thin adults or children who have a small amount of body fat. Follow instructions from your provider about the right size needle and angle you should use for the injection. After the needle is completely inserted into the skin, release the skin that you are pinching. Continue to hold the syringe with your writing hand. Use your thumb or index finger of your writing hand to push the plunger all the way into the syringe to inject the medicine. Pull the needle straight out of the skin. If there is bleeding: Press and hold a piece of gauze over the injection site until bleeding stops. Do not rub the area. Cover the injection site with a bandage, if needed. How to  safely throw away the supplies If you are using a syringe that does not have a safety system for shielding the needle after injection: Do not recap the needle. Place the syringe and needle in the disposal container. If your syringe has a safety system for shielding the needle after injection: Firmly push down on the plunger after you complete the injection. The protective sleeve will automatically cover the needle, and you will hear a click. The click means that the needle is safely covered. Follow the disposal regulations for the area where you live. Do not use any syringe or needle more than one time. Contact a health care provider if: You have trouble giving the injection. You think that the injection was not given correctly. You have trouble with any of the supplies. The medicine causes side effects. You get a rash on your skin. A get a fever. The condition that is being treated gets worse. Get help right away if: You get any of these symptoms after the injection is given: Trouble breathing. Chest pain. A rash over most or all of your body. Swelling of the  lips or tongue. Trouble swallowing. These symptoms may be an emergency. Get help right away. Call 911. Do not wait to see if the symptoms will go away. Do not drive yourself to the hospital. This information is not intended to replace advice given to you by your health care provider. Make sure you discuss any questions you have with your health care provider. Document Revised: 12/19/2021 Document Reviewed: 12/19/2021 Elsevier Patient Education  2024 [employer].

## 2023-09-25 NOTE — Telephone Encounter (Signed)
 Labs ordered today for lipid panel. Insurance is requesting labs done in last 120 days. We will need to do prior authorization for repatha  once labs done.    Closed [id number] for now

## 2023-09-28 ENCOUNTER — Encounter: Payer: Self-pay | Admitting: Pulmonary Disease

## 2023-09-28 ENCOUNTER — Ambulatory Visit: Admitting: Pulmonary Disease

## 2023-09-28 VITALS — BP 130/64 | HR 64 | Temp 97.8°F | Ht 63.0 in | Wt 210.0 lb

## 2023-09-28 DIAGNOSIS — J9611 Chronic respiratory failure with hypoxia: Secondary | ICD-10-CM | POA: Diagnosis not present

## 2023-09-28 DIAGNOSIS — I272 Pulmonary hypertension, unspecified: Secondary | ICD-10-CM | POA: Diagnosis not present

## 2023-09-28 DIAGNOSIS — J849 Interstitial pulmonary disease, unspecified: Secondary | ICD-10-CM

## 2023-09-28 NOTE — Patient Instructions (Signed)
 Nice to see you again  No change in the medication  Will repeat a CT scan in October, as this will be a 2-year interval since her last scan.  Will meet afterwards to discuss results and any next steps if needed.  Return to clinic in 3 to 4 months after CT scan

## 2023-09-28 NOTE — Progress Notes (Unsigned)
 @Patient  ID: [name], female    DOB: 1945/10/25, 78 y.o.   MRN: [id number]  Chief Complaint  Patient presents with   Follow-up    Follow up from Oct. 2024.  Pt has no concerns that she would like addressed.     Referring provider: Larnell Hamilton, MD  HPI:   78 y.o. woman whom we are seeing for evaluation of pulmonary hypertension confirmed right heart cath 02/2022, ILD, chronic hypoxemic respiratory failure.  Most recent cardiology note reviewed.  Returns for routine follow-up.  In interim since last visit had right heart catheterization demonstrated mean PA pressure in the mid 30s, wedge of 18 that is elevated, preserved cardiac output and index and PVR between 2.9 and 3.5 depending on Fick versus thermodilution.  CT scan again reviewed that shows scattered mosaicism and groundglass.  Discussed at length possibly sarcoidosis.  This is possible to get imaging findings to me are not classic.  Certainly sarcoidosis do anything.  However, to pursue treatment of sarcoidosis and needed biopsy.  PET scan may be more less suggestive for increase or decrease posttest probability, but without biopsy I am not confident in pursuing treatment for sarcoidosis regardless of imaging findings.  We discussed at length her pulmonary hypertension diagnosis.  With her mitral valve stenosis and prominent V waves, hesitant for any pulmonary vasodilators.  With her significant parenchymal disease, would not pursue systemic pulmonary vasodilators.  We did discuss a trial of Tyvaso.  Which was recommended.  She is precontemplative.  HPI at initial visit: Patiently recently reestablished with pulmonary clinic.  Increased cough.  Some dyspnea on exertion as well.  In the past and followed with Dr. Geronimo.  CT scan with interstitial changes, concern for hypersensitivity pneumonitis.  Did not follow-up.  Seen recently.  CT high-res shows worsening interlobular septal thickening, mosaicism, scattered nodular  opacities concerning for hypersensitivity pneumonitis, sarcoidosis considered.  PA appeared enlarged on that CT scan.  This prompted TTE which demonstrated RV dysfunction, RA dilation.  Review of TTE dating back to 2020 shows RV dysfunction.  Not present in 2018.  She has dyspnea.  With relatively robust exertion.  Normal walking outside bed.  Worse with inclines or stairs.  Worsening a bit over the last several months to year or 2.  PMH: Atrial fibrillation, hypertension, diastolic congestive heart failure, CAD Surgical history: CABG, cholecystectomy Family history:Mother with hypertension, CAD, hyperlipidemia, father with diabetes, CAD, CVA Social history: Never smoker, lives in [place]     Questionaires / Pulmonary Flowsheets:   ACT:      No data to display          MMRC:     No data to display          Epworth:      No data to display          Tests:   FENO:  No results found for: NITRICOXIDE  PFT:    Latest Ref Rng & Units 08/29/2021    9:55 AM 03/01/2019    8:56 AM 12/12/2018    4:50 PM 12/28/2017    3:47 PM  PFT Results  FVC-Pre L 1.25  1.55  1.23    FVC-Predicted Pre % 45  54  42  56   FVC-Post L 1.25    1.61   FVC-Predicted Post % 45    55   Pre FEV1/FVC % % 93  92  92  91   Post FEV1/FCV % % 93  91   FEV1-Pre L 1.16  1.43  1.12  1.49   FEV1-Predicted Pre % 56  66  52  68   FEV1-Post L 1.17    1.47   DLCO uncorrected ml/min/mmHg 10.88  18.22  11.35  15.97   DLCO UNC% % 57  95  59  67   DLCO corrected ml/min/mmHg 10.88   12.17    DLCO COR %Predicted % 57   64    DLVA Predicted % 122  155  152  121   TLC L 2.71    2.93   TLC % Predicted % 54    58   RV % Predicted % 54    52   02/2019 reviewed and interpreted as spirometry suggestive of moderate restriction, DLCO within normal limits 11/2018 reviewed and interpreted as chronically suggestive of moderate restriction, DLCO moderately reduced 12/2017 reviewed and interpreted  as primary suggestive of my restriction versus air trapping, no bronchodilator response, lung volumes consistent with moderate restriction, DLCO moderately reduced 08/2021 reviewed interpreted spirometry suggestive of severe restriction versus air trapping, no bronchodilator response, lung volumes consistent with moderate restriction with moderately reduced DLCO WALK:     06/28/2019   11:15 AM 03/01/2019   10:31 AM 12/28/2018    3:27 PM 02/04/2018    9:39 AM  SIX MIN WALK  Supplimental Oxygen  during Test? (L/min) No No No No  Tech Comments: slow pace walk, SOB after first lap, three stops to rest, sats WNL normal pace, knee pain, no dsat TA/CMA Pt did not want to continue due to leg pain. Pt did have some dypsnea. Pt ambulated at a slow pace with a steady gait.     Imaging: Personally reviewed CUP PACEART INCLINIC DEVICE CHECK Result Date: 09/02/2023 Normal in-clinic dual chamber pacemaker check programmed VVIR. Presenting Rhythm: A-fib/ VP- 61 . Routine testing of thresholds, sensing, and impedance demonstrate stable parameters and no programming changes needed at this time. No episodes. Estimated longevity 13.2 years. Pt enrolled in remote follow-up. Upon arrival patient had a small scab at the distal portion of her incision site. Scab removed- no signs/ symptoms of infection, drainage, redness at the site. Wound well healed.Powell Level, BSN, RN  CUP PACEART REMOTE DEVICE CHECK Result Date: 09/02/2023 PPM Scheduled remote reviewed. Normal device function.  Presenting rhythm:  VP 4 NSVT, HR's 162-203, 4-7 beats in duration.  Longer event 4/15, > 20 beats, HR 186.  Route to triage per protocol Next remote 91 days. LA, CVRS   Lab Results: Personally reviewed CBC    Component Value Date/Time   WBC 9.0 05/21/2023 0955   WBC 10.1 02/10/2022 1110   RBC 3.78 05/21/2023 0955   RBC 3.49 (L) 02/10/2022 1110   HGB 11.1 05/21/2023 0955   HCT 34.9 05/21/2023 0955   PLT 315 05/21/2023 0955    MCV 92 05/21/2023 0955   MCH 29.4 05/21/2023 0955   MCH 28.1 02/10/2022 1110   MCHC 31.8 05/21/2023 0955   MCHC 31.9 02/10/2022 1110   RDW 13.5 05/21/2023 0955   LYMPHSABS 3.7 12/12/2018 0321   MONOABS 0.7 12/12/2018 0321   EOSABS 0.2 12/12/2018 0321   BASOSABS 0.1 12/12/2018 0321    BMET    Component Value Date/Time   NA 142 05/21/2023 0955   K 5.3 (H) 05/21/2023 0955   CL 93 (L) 05/21/2023 0955   CO2 33 (H) 05/21/2023 0955   GLUCOSE 159 (H) 05/21/2023 0955   GLUCOSE 145 (H) 04/09/2022 1108  BUN 35 (H) 05/21/2023 0955   CREATININE 1.76 (H) 05/21/2023 0955   CALCIUM  10.4 (H) 05/21/2023 0955   GFRNONAA 23 (L) 04/09/2022 1108   GFRAA 76 03/24/2019 1237    BNP    Component Value Date/Time   BNP 75.8 03/24/2022 1140    ProBNP    Component Value Date/Time   PROBNP 114.0 (H) 10/21/2021 1227    Specialty Problems       Pulmonary Problems   Sleep apnea-unable to tolerate C-pap   Amiodarone  pulmonary toxicity   Restrictive lung disease   ILD (interstitial lung disease) (HCC)    Allergies  Allergen Reactions   Amiodarone       pulmonary fibrosis   Gabapentin  Swelling    edema   Torsemide  Nausea Only    Headache    Immunization History  Administered Date(s) Administered   Pneumococcal Conjugate-13 12/09/2016    Past Medical History:  Diagnosis Date   Asthmatic bronchitis    CHF (congestive heart failure) (HCC) dx'd 2016   Coronary atherosclerosis of native coronary artery    a. s/p CABG 2005 Dr Dusty     Hyperlipidemia    Hypertension    Hypertensive heart disease    Kidney stones    Mixed hyperlipidemia    Nephrolithiasis 06/07/2015   Paroxysmal atrial fibrillation (HCC)    a. s/p Maze in 2005 b. recurrence in March 2016 with >8sec posttermination pauses s/p PPM implant     Pneumonia 1950   double   Presence of permanent cardiac pacemaker    Restrictive lung disease 03/03/2018   Sleep apnea    tested; mask ordered; couldn't afford it; will  get it now (09/04/2015)   TIA (transient ischemic attack)    Type II diabetes mellitus (HCC)    Uterine cancer [facility])    age 28 with partial hysterectomy    Tobacco History: Social History   Tobacco Use  Smoking Status Never  Smokeless Tobacco Never   Counseling given: Not Answered   Continue to not smoke  Outpatient Encounter Medications as of 09/28/2023  Medication Sig   apixaban  (ELIQUIS ) 5 MG TABS tablet TAKE ONE TABLET TWICE DAILY   B Complex-Biotin-FA (B-COMPLEX PO) Take 1 capsule by mouth every morning.    Biotin 1000 MCG tablet Take 1,000 mcg by mouth daily.   Coenzyme Q10 (CO Q-10) 200 MG CAPS Take 200 mg by mouth daily.   Evolocumab  (REPATHA  SURECLICK) 140 MG/ML SOAJ Inject 140 mg into the skin every 14 (fourteen) days.   FARXIGA 10 MG TABS tablet Take 10 mg by mouth daily.   furosemide  (LASIX ) 40 MG tablet Take 1 tablet (40 mg total) by mouth 2 (two) times daily.   insulin  NPH-regular Human (70-30) 100 UNIT/ML injection Inject 40 Units into the skin 3 (three) times daily with meals.   metolazone (ZAROXOLYN) 2.5 MG tablet Take 2.5 mg by mouth daily as needed (weight gain > 5lbs).   metoprolol  succinate (TOPROL -XL) 50 MG 24 hr tablet Take 1 tablet (50 mg total) by mouth daily.   OXYGEN  Inhale 2 L into the lungs continuous.   No facility-administered encounter medications on file as of 09/28/2023.     Review of Systems  Review of Systems  N/a Physical Exam  BP 130/64 (BP Location: Left Arm, Patient Position: Sitting, Cuff Size: Large)   Pulse 64   Temp 97.8 F (36.6 C) (Oral)   Ht 5' 3 (1.6 m)   Wt 210 lb (95.3 kg)   SpO2 92%  BMI 37.20 kg/m   Wt Readings from Last 5 Encounters:  09/28/23 210 lb (95.3 kg)  09/25/23 211 lb (95.7 kg)  09/02/23 214 lb (97.1 kg)  05/27/23 227 lb (103 kg)  04/21/23 225 lb (102.1 kg)    BMI Readings from Last 5 Encounters:  09/28/23 37.20 kg/m  09/25/23 37.38 kg/m  09/02/23 37.91 kg/m  05/27/23 38.96 kg/m   04/21/23 38.62 kg/m     Physical Exam General: Sitting in chair, no acute distress Eyes: EOMI, icterus Neck: Supple, no JVP Pulmonary: Faint crackles upper lung fields, otherwise clear, normal work of breathing Cardiovascular: Regular rate and rhythm, no murmur Bowel sounds present MSK: No synovitis, no joint effusion Neuro: Normal gait, no weakness Psych: Normal mood, full affect   Assessment & Plan:   Pulmonary hypertension: Suspect this is multifactorial.  Longstanding evidence of left-sided structural disease or group 2 disease including mitral valve stenosis dating back to 2018, dilated left atrium in 2020, and ongoing dilated left atrium more recently 2023.  There is been RV dysfunction since at least 11/2018 including right atrial pressure elevation at that time.  Other contributors include group 3 disease given her interstitial lung disease on CT scan.  High concern for undiagnosed hypoxemia given significant interstitial changes.   Untreated sleep apnea likely contributing.  Encouraged to resume CPAP therapy.  No known history of VTE.  Low suspicion for Group 1 disease.  Overall, not good candidate for pulmonary vasodilators given left-sided disease as well as her inability to tolerate or use CPAP.   Her right heart catheterization 02/2022 was relatively reassuring but also consistent with the above with elevated wedge, PVR 2.93.5, mildly elevated.  Discussed at length risk of systemic pulm vasodilators given structural heart disease and interstitial lung disease.  Discussed Tyvaso which I think is a reasonable thing to try but did counsel her on possible adverse events to be looking out for.  I recommended trial of Tyvaso.  As of now, she does not want take more medicines.  She and family do more research and get back to me if she would like to start the process prior to next scheduled follow-up.  Chronic hypoxemic respiratory failure: Related ILD, waxes and wanes but usually on 2  L.  Interstitial lung disease: On CT scan, worse 2023 compared to 2020.  HSP versus NSIP in my opinion.  Possibly related to prior amiodarone  exposure.  Consider antifibrotic's versus immunosuppressants in the future.  Repeat CT scan 11/2021.  PFTs 2023 largely unchanged compared to 2020.  Notably PFTs in 2021 seem spurious with multiple values much higher than these test that preceded and performed after that test in 2021.  Inflammatory markers flat 08/2021.  Think biopsy would be helpful but given her tenuous cardiovascular, pulmonary, and renal disease this is high risk and not recommended at this time.  She seems at peace at this time but we do not have really any options for further evaluation or treatment.  Sarcoidosis possible.  But without biopsy would not pursue treatment.  PET scan could be informative, decrease order increase posttest probability of sarcoidosis but without biopsy would not change treatment decisions.  She does not want to pursue biopsy as she does not want to be put to sleep which has been told its likely she would not wake up from anesthesia.  No follow-ups on file.   Donnice JONELLE Beals, MD 09/28/2023   This appointment required 41 minutes of patient care (this includes precharting, chart review, review  of results, face-to-face care, etc.).

## 2023-10-01 NOTE — Telephone Encounter (Signed)
 Labs still not done. Lmom for patient to call back

## 2023-10-05 NOTE — Telephone Encounter (Signed)
 Spoke with daughter and patient will go this week for labs  Daughter will send email after done so that I can notify PA team

## 2023-10-05 NOTE — Telephone Encounter (Signed)
 Hi, we received a request to do a prior authorization for repatha  but the insurance is requiring labs done in the last 120 days. Labs were ordered on 09/25/23 but she still hasn't had them done yet. I did try to call her the other day but she did not answer and hasn't called me back. Sending to see if you guys have better luck with getting in touch with her. Thank you

## 2023-10-09 ENCOUNTER — Encounter (HOSPITAL_BASED_OUTPATIENT_CLINIC_OR_DEPARTMENT_OTHER): Payer: Self-pay | Admitting: Cardiovascular Disease

## 2023-10-10 LAB — LIPID PANEL
Chol/HDL Ratio: 4.2 ratio (ref 0.0–4.4)
Cholesterol, Total: 237 mg/dL — ABNORMAL HIGH (ref 100–199)
HDL: 57 mg/dL (ref >39)
LDL Chol Calc (NIH): 148 mg/dL — ABNORMAL HIGH (ref 0–99)
Triglycerides: 176 mg/dL — ABNORMAL HIGH (ref 0–149)
VLDL Cholesterol Cal: 32 mg/dL (ref 5–40)

## 2023-10-10 LAB — COMPREHENSIVE METABOLIC PANEL WITH GFR
ALT: 16 IU/L (ref 0–32)
AST: 38 IU/L (ref 0–40)
Albumin: 4.1 g/dL (ref 3.8–4.8)
Alkaline Phosphatase: 62 IU/L (ref 44–121)
BUN/Creatinine Ratio: 20 (ref 12–28)
BUN: 27 mg/dL (ref 8–27)
Bilirubin Total: 0.4 mg/dL (ref 0.0–1.2)
CO2: 28 mmol/L (ref 20–29)
Calcium: 9.9 mg/dL (ref 8.7–10.3)
Chloride: 96 mmol/L (ref 96–106)
Creatinine, Ser: 1.33 mg/dL — ABNORMAL HIGH (ref 0.57–1.00)
Globulin, Total: 3.5 g/dL (ref 1.5–4.5)
Glucose: 144 mg/dL — ABNORMAL HIGH (ref 70–99)
Potassium: 4.7 mmol/L (ref 3.5–5.2)
Sodium: 144 mmol/L (ref 134–144)
Total Protein: 7.6 g/dL (ref 6.0–8.5)
eGFR: 41 mL/min/1.73 — ABNORMAL LOW (ref >59)

## 2023-10-12 ENCOUNTER — Telehealth: Payer: Self-pay | Admitting: Pharmacy Technician

## 2023-10-12 ENCOUNTER — Other Ambulatory Visit (HOSPITAL_COMMUNITY): Payer: Self-pay

## 2023-10-12 ENCOUNTER — Ambulatory Visit (HOSPITAL_BASED_OUTPATIENT_CLINIC_OR_DEPARTMENT_OTHER): Payer: Self-pay | Admitting: Family

## 2023-10-12 NOTE — Telephone Encounter (Signed)
 Pharmacy Patient Advocate Encounter   Received notification from Pt Calls Messages that prior authorization for REPATHA  is required/requested.   Insurance verification completed.   The patient is insured through [employer] .   Per test claim: PA required; PA submitted to above mentioned insurance via LATENT Key/confirmation #/EOC [id number] Status is pending

## 2023-10-12 NOTE — Telephone Encounter (Signed)
 Updated labs 09/2023 include total cholesterol 237, triglycerides 176, HDL 57, LDL 148 with LDL goal being <70 given CAD. intolerant to statin including Atorvastatin  10mg , pravastatin  20mg /40mg .  Will route to PA team.  Reche GORMAN Finder, NP

## 2023-10-12 NOTE — Telephone Encounter (Signed)
 Pharmacy Patient Advocate Encounter  Received notification from [facility] that Prior Authorization for repatha  has been APPROVED from 10/12/23 to 04/13/24   PA #/Case ID/Reference #: [id number]

## 2023-10-21 ENCOUNTER — Encounter (HOSPITAL_COMMUNITY): Payer: Self-pay | Admitting: Emergency Medicine

## 2023-10-21 ENCOUNTER — Emergency Department (HOSPITAL_COMMUNITY)

## 2023-10-21 ENCOUNTER — Inpatient Hospital Stay (HOSPITAL_COMMUNITY)
Admission: EM | Admit: 2023-10-21 | Discharge: 2023-10-24 | DRG: 196 | Disposition: A | Discharge status: Home Health | Attending: Internal Medicine | Admitting: Internal Medicine

## 2023-10-21 ENCOUNTER — Other Ambulatory Visit: Payer: Self-pay

## 2023-10-21 DIAGNOSIS — E1165 Type 2 diabetes mellitus with hyperglycemia: Secondary | ICD-10-CM | POA: Diagnosis present

## 2023-10-21 DIAGNOSIS — Z833 Family history of diabetes mellitus: Secondary | ICD-10-CM

## 2023-10-21 DIAGNOSIS — J84112 Idiopathic pulmonary fibrosis: Secondary | ICD-10-CM | POA: Diagnosis not present

## 2023-10-21 DIAGNOSIS — I480 Paroxysmal atrial fibrillation: Secondary | ICD-10-CM | POA: Diagnosis present

## 2023-10-21 DIAGNOSIS — R5381 Other malaise: Secondary | ICD-10-CM | POA: Diagnosis present

## 2023-10-21 DIAGNOSIS — I2510 Atherosclerotic heart disease of native coronary artery without angina pectoris: Secondary | ICD-10-CM | POA: Diagnosis present

## 2023-10-21 DIAGNOSIS — E119 Type 2 diabetes mellitus without complications: Secondary | ICD-10-CM | POA: Diagnosis not present

## 2023-10-21 DIAGNOSIS — I5032 Chronic diastolic (congestive) heart failure: Secondary | ICD-10-CM | POA: Diagnosis not present

## 2023-10-21 DIAGNOSIS — Z8542 Personal history of malignant neoplasm of other parts of uterus: Secondary | ICD-10-CM

## 2023-10-21 DIAGNOSIS — Z8679 Personal history of other diseases of the circulatory system: Secondary | ICD-10-CM

## 2023-10-21 DIAGNOSIS — J9611 Chronic respiratory failure with hypoxia: Secondary | ICD-10-CM | POA: Diagnosis not present

## 2023-10-21 DIAGNOSIS — J849 Interstitial pulmonary disease, unspecified: Secondary | ICD-10-CM | POA: Diagnosis present

## 2023-10-21 DIAGNOSIS — Z8249 Family history of ischemic heart disease and other diseases of the circulatory system: Secondary | ICD-10-CM

## 2023-10-21 DIAGNOSIS — Z6837 Body mass index (BMI) 37.0-37.9, adult: Secondary | ICD-10-CM

## 2023-10-21 DIAGNOSIS — Z794 Long term (current) use of insulin: Secondary | ICD-10-CM

## 2023-10-21 DIAGNOSIS — Z7901 Long term (current) use of anticoagulants: Secondary | ICD-10-CM

## 2023-10-21 DIAGNOSIS — E66812 Obesity, class 2: Secondary | ICD-10-CM | POA: Diagnosis present

## 2023-10-21 DIAGNOSIS — Z79899 Other long term (current) drug therapy: Secondary | ICD-10-CM

## 2023-10-21 DIAGNOSIS — R0902 Hypoxemia: Principal | ICD-10-CM

## 2023-10-21 DIAGNOSIS — D72829 Elevated white blood cell count, unspecified: Secondary | ICD-10-CM | POA: Diagnosis present

## 2023-10-21 DIAGNOSIS — J9621 Acute and chronic respiratory failure with hypoxia: Secondary | ICD-10-CM | POA: Diagnosis present

## 2023-10-21 DIAGNOSIS — R0602 Shortness of breath: Secondary | ICD-10-CM | POA: Diagnosis not present

## 2023-10-21 DIAGNOSIS — I110 Hypertensive heart disease with heart failure: Secondary | ICD-10-CM | POA: Diagnosis present

## 2023-10-21 DIAGNOSIS — Z951 Presence of aortocoronary bypass graft: Secondary | ICD-10-CM

## 2023-10-21 DIAGNOSIS — Z8673 Personal history of transient ischemic attack (TIA), and cerebral infarction without residual deficits: Secondary | ICD-10-CM

## 2023-10-21 DIAGNOSIS — M6281 Muscle weakness (generalized): Secondary | ICD-10-CM | POA: Diagnosis present

## 2023-10-21 DIAGNOSIS — I5033 Acute on chronic diastolic (congestive) heart failure: Secondary | ICD-10-CM | POA: Diagnosis present

## 2023-10-21 DIAGNOSIS — Z950 Presence of cardiac pacemaker: Secondary | ICD-10-CM

## 2023-10-21 DIAGNOSIS — Z9981 Dependence on supplemental oxygen: Secondary | ICD-10-CM

## 2023-10-21 LAB — CBC
HCT: 41.4 % (ref 36.0–46.0)
Hemoglobin: 12.9 g/dL (ref 12.0–15.0)
MCH: 28.4 pg (ref 26.0–34.0)
MCHC: 31.2 g/dL (ref 30.0–36.0)
MCV: 91.2 fL (ref 80.0–100.0)
Platelets: 335 K/uL (ref 150–400)
RBC: 4.54 MIL/uL (ref 3.87–5.11)
RDW: 15.4 % (ref 11.5–15.5)
WBC: 11.1 K/uL — ABNORMAL HIGH (ref 4.0–10.5)
nRBC: 0 % (ref 0.0–0.2)

## 2023-10-21 LAB — GLUCOSE, CAPILLARY: Glucose-Capillary: 213 mg/dL — ABNORMAL HIGH (ref 70–99)

## 2023-10-21 LAB — BASIC METABOLIC PANEL WITH GFR
Anion gap: 14 (ref 5–15)
BUN: 19 mg/dL (ref 8–23)
CO2: 25 mmol/L (ref 22–32)
Calcium: 9.3 mg/dL (ref 8.9–10.3)
Chloride: 98 mmol/L (ref 98–111)
Creatinine, Ser: 1.06 mg/dL — ABNORMAL HIGH (ref 0.44–1.00)
GFR, Estimated: 54 mL/min — ABNORMAL LOW (ref >60)
Glucose, Bld: 138 mg/dL — ABNORMAL HIGH (ref 70–99)
Potassium: 3.5 mmol/L (ref 3.5–5.1)
Sodium: 137 mmol/L (ref 135–145)

## 2023-10-21 LAB — RESP PANEL BY RT-PCR (RSV, FLU A&B, COVID)  RVPGX2
Influenza A by PCR: NEGATIVE
Influenza B by PCR: NEGATIVE
Resp Syncytial Virus by PCR: NEGATIVE
SARS Coronavirus 2 by RT PCR: NEGATIVE

## 2023-10-21 LAB — MAGNESIUM: Magnesium: 1.8 mg/dL (ref 1.7–2.4)

## 2023-10-21 LAB — PROCALCITONIN: Procalcitonin: <0.1 ng/mL

## 2023-10-21 LAB — BRAIN NATRIURETIC PEPTIDE: B Natriuretic Peptide: 210.2 pg/mL — ABNORMAL HIGH (ref 0.0–100.0)

## 2023-10-21 MED ORDER — SODIUM CHLORIDE 0.9 % IV SOLN: 500.0000 mg | INTRAVENOUS | Status: DC

## 2023-10-21 MED ORDER — IPRATROPIUM-ALBUTEROL 0.5-2.5 (3) MG/3ML IN SOLN: 3.0000 mL | Freq: Four times a day (QID) | RESPIRATORY_TRACT | Status: DC

## 2023-10-21 MED ORDER — ONDANSETRON HCL 4 MG/2ML IJ SOLN
4.0000 mg | Freq: Once | INTRAMUSCULAR | Status: DC
  Filled 2023-10-21: qty 2

## 2023-10-21 MED ORDER — INSULIN DETEMIR 100 UNIT/ML FLEXPEN: 12.0000 [IU] | Freq: Two times a day (BID) | SUBCUTANEOUS | Status: DC

## 2023-10-21 MED ORDER — ACETAMINOPHEN 650 MG RE SUPP: 650.0000 mg | Freq: Four times a day (QID) | RECTAL | Status: DC | PRN

## 2023-10-21 MED ORDER — INSULIN ASPART 100 UNIT/ML IJ SOLN
0.0000 [IU] | Freq: Three times a day (TID) | INTRAMUSCULAR | Status: DC
  Administered 2023-10-22: 5 [IU] via SUBCUTANEOUS
  Administered 2023-10-22: 7 [IU] via SUBCUTANEOUS
  Administered 2023-10-22: 3 [IU] via SUBCUTANEOUS
  Administered 2023-10-23: 7 [IU] via SUBCUTANEOUS
  Administered 2023-10-23 (×2): 5 [IU] via SUBCUTANEOUS
  Administered 2023-10-24 (×2): 2 [IU] via SUBCUTANEOUS

## 2023-10-21 MED ORDER — INSULIN GLARGINE-YFGN 100 UNIT/ML ~~LOC~~ SOLN
12.0000 [IU] | Freq: Two times a day (BID) | SUBCUTANEOUS | Status: DC
  Administered 2023-10-21 – 2023-10-24 (×6): 12 [IU] via SUBCUTANEOUS
  Filled 2023-10-21 (×7): qty 0.12

## 2023-10-21 MED ORDER — FUROSEMIDE 40 MG PO TABS
40.0000 mg | ORAL_TABLET | Freq: Two times a day (BID) | ORAL | Status: DC
  Administered 2023-10-21 – 2023-10-22 (×2): 40 mg via ORAL
  Filled 2023-10-21 (×2): qty 1

## 2023-10-21 MED ORDER — IPRATROPIUM-ALBUTEROL 0.5-2.5 (3) MG/3ML IN SOLN
3.0000 mL | Freq: Two times a day (BID) | RESPIRATORY_TRACT | Status: DC
  Administered 2023-10-22 – 2023-10-24 (×5): 3 mL via RESPIRATORY_TRACT
  Filled 2023-10-21 (×5): qty 3

## 2023-10-21 MED ORDER — INSULIN ASPART 100 UNIT/ML IJ SOLN
0.0000 [IU] | Freq: Every day | INTRAMUSCULAR | Status: DC
  Administered 2023-10-21: 2 [IU] via SUBCUTANEOUS
  Administered 2023-10-22: 4 [IU] via SUBCUTANEOUS
  Administered 2023-10-23: 3 [IU] via SUBCUTANEOUS

## 2023-10-21 MED ORDER — SODIUM CHLORIDE 0.9 % IV SOLN
1.0000 g | Freq: Once | INTRAVENOUS | Status: AC
  Administered 2023-10-21: 1 g via INTRAVENOUS
  Filled 2023-10-21: qty 10

## 2023-10-21 MED ORDER — METHYLPREDNISOLONE SODIUM SUCC 125 MG IJ SOLR
80.0000 mg | Freq: Two times a day (BID) | INTRAMUSCULAR | Status: DC
  Administered 2023-10-22: 80 mg via INTRAVENOUS
  Filled 2023-10-21: qty 2

## 2023-10-21 MED ORDER — MELATONIN 3 MG PO TABS
3.0000 mg | ORAL_TABLET | Freq: Every evening | ORAL | Status: DC | PRN
  Administered 2023-10-21: 3 mg via ORAL
  Filled 2023-10-21: qty 1

## 2023-10-21 MED ORDER — METOPROLOL SUCCINATE ER 50 MG PO TB24
50.0000 mg | ORAL_TABLET | Freq: Every day | ORAL | Status: DC
  Administered 2023-10-22 – 2023-10-24 (×3): 50 mg via ORAL
  Filled 2023-10-21 (×3): qty 1

## 2023-10-21 MED ORDER — APIXABAN 5 MG PO TABS
5.0000 mg | ORAL_TABLET | Freq: Two times a day (BID) | ORAL | Status: DC
  Administered 2023-10-21 – 2023-10-24 (×6): 5 mg via ORAL
  Filled 2023-10-21 (×6): qty 1

## 2023-10-21 MED ORDER — SODIUM CHLORIDE 0.9 % IV SOLN
1.0000 g | INTRAVENOUS | Status: DC
  Administered 2023-10-22 – 2023-10-23 (×2): 1 g via INTRAVENOUS
  Filled 2023-10-21 (×2): qty 10

## 2023-10-21 MED ORDER — ACETAMINOPHEN 325 MG PO TABS
650.0000 mg | ORAL_TABLET | Freq: Four times a day (QID) | ORAL | Status: DC | PRN
  Administered 2023-10-23 – 2023-10-24 (×3): 650 mg via ORAL
  Filled 2023-10-21 (×3): qty 2

## 2023-10-21 MED ORDER — IPRATROPIUM-ALBUTEROL 0.5-2.5 (3) MG/3ML IN SOLN
3.0000 mL | Freq: Once | RESPIRATORY_TRACT | Status: AC
  Administered 2023-10-21: 3 mL via RESPIRATORY_TRACT
  Filled 2023-10-21: qty 3

## 2023-10-21 MED ORDER — METHYLPREDNISOLONE SODIUM SUCC 125 MG IJ SOLR
125.0000 mg | Freq: Once | INTRAMUSCULAR | Status: AC
  Administered 2023-10-21: 125 mg via INTRAVENOUS
  Filled 2023-10-21: qty 2

## 2023-10-21 MED ORDER — ONDANSETRON HCL 4 MG/2ML IJ SOLN: 4.0000 mg | Freq: Four times a day (QID) | INTRAMUSCULAR | Status: DC | PRN

## 2023-10-21 MED ORDER — ALBUTEROL SULFATE (2.5 MG/3ML) 0.083% IN NEBU: 2.5000 mg | INHALATION_SOLUTION | RESPIRATORY_TRACT | Status: DC | PRN

## 2023-10-21 MED ORDER — IOHEXOL 350 MG/ML SOLN
60.0000 mL | Freq: Once | INTRAVENOUS | Status: AC | PRN
  Administered 2023-10-21: 60 mL via INTRAVENOUS

## 2023-10-21 MED ORDER — SODIUM CHLORIDE 0.9 % IV SOLN
500.0000 mg | Freq: Once | INTRAVENOUS | Status: AC
  Administered 2023-10-21: 500 mg via INTRAVENOUS
  Filled 2023-10-21: qty 5

## 2023-10-21 NOTE — H&P (Signed)
 History and Physical      [name] FMW:[id number] DOB: 11-19-1945 DOA: 10/21/2023; DOS: 10/21/2023  PCP: Larnell Hamilton, MD  Patient coming from: home   I have personally briefly reviewed patient's old medical records in [facility]  Chief Complaint: Shortness of breath  HPI: [name] is a 78 y.o. female with medical history significant for chronic hypoxic respiratory failure on 2 L continuous nasal cannula, interstitial lung disease, chronic diastolic heart failure, coronary artery disease status post CABG in 2005, paroxysmal atrial fibrillation complicated with sick sinus syndrome status post pacemaker placement in March 2016, type 2 diabetes mellitus, who is admitted to [facility] on 10/21/2023 with suspected acute exacerbation of interstitial lung disease after presenting from home to [facility] ED complaining of shortness of breath.   The patient reports 2 weeks of progressive shortness of breath, which worsens with exertion.  She denies associated orthopnea, PND, or worsening peripheral edema.  She reports a associated mild nonproductive cough, in the absence of any associated subjective fever, chills, rigors, or generalized myalgias.  No sense of any chest pain, palpitations, diaphoresis, dizziness, presyncope, or syncope.  No wheezing or hemoptysis.  She has a history of chronic interstitial lung disease complicated by chronic hypoxic respiratory failure on 2 L continuous nasal cannula.  Is a lifelong non-smoker.  Her medical history is also notable for chronic diastolic heart failure, with most recent echocardiogram performed in December 2023, which was notable for LVEF 55 to 60%, no evidence of wall motion abnormalities, indeterminate right ventricular systolic function, and mild to moderate mitral stenosis, otherwise no evidence of significant valvular pathology.  She reports good compliance with her outpatient diuretic regimen which consists of Lasix  40 mg p.o. twice  daily as well as as needed metolazone.  Per chart review, the range of BNP dating back to March 2016 has been between 75-2 36, with most recent prior BNP noted to be 75 in January 2024.  She also has a history of paroxysmal atrial fibrillation for which she underwent Maze procedure in 2025, followed by recurrence of her atrial fibrillation in March 2016 which was associated with sick sinus syndrome, prompting placement of Medtronic pacemaker in March 2016 with ensuing generator change out on 05/27/2023.  In the context of his history of paroxysmal atrial fibrillation she is chronically anticoagulated with Eliquis .     ED Course:  Vital signs in the ED were notable for the following: Afebrile; heart rates in the 60s.  Blood pressures in the 140s to 160s; respiratory rate 15-24, at rest oxygen  saturation in the range of 93 to 99% on her baseline 2 L nasal cannula.  With ambulation, her oxygen  saturation decreased to 80% on her baseline 2 L nasal cannula.  Labs were notable for the following: BMP was notable for the following: Sodium 137, potassium 3.5, bicarbonate 25, creatinine 1.06 relative to most recent prior creatinine data point 1.33 on 725.5, glucose 138.  Calcium  9.3.  BNP was 210.  Procalcitonin was ordered by EDP, which is currently pending.  CBC notable for white cell count 11,100 compared to 9000 on 05/21/2023, hemoglobin 12.9.  COVID, flu, RSV PCR were all negative.  Per my interpretation, EKG in ED demonstrated the following: In comparison to most recent prior EKG performed on 09/02/2023, presenting EKG shows ventricular paced rhythm, which is unchanged from most recent prior EKG, with heart rate 60, nonspecific T wave inversion in aVL, which is unchanged from most recent prior EKG on 09/02/2023,  and ST elevation in leads II, 3, aVF, which is also unchanged from most recent prior EKG performed on 09/02/2023, demonstrating no evidence of new ST changes.  Imaging in the ED, per corresponding  formal radiology read, was notable for the following: CTA chest with PE protocol, in comparison to high-resolution CT chest from September 2023, today CT chest shows redemonstration of previously noted findings consistent with interstitial lung disease, with today CT chest showing interval increase in groundglass opacities, with differential, per radiology, including potential artifact from the presence of lower lung volumes versus multifocal pneumonia versus pulmonary edema.  Today CTA chest showed no evidence of acute pulmonary embolism will demonstrating no evidence of focal effusion or pneumothorax.  While in the ED, the following were administered: DuoNeb Iser treatment x 1, Solu-Medrol  125 mg IV x 1 dose, azithromycin , Rocephin .  Subsequently, the patient was admitted for further evaluation management of suspected presenting acute exacerbation of interstitial lung disease.     Review of Systems: As per HPI otherwise 10 point review of systems negative.   Past Medical History:  Diagnosis Date   Asthmatic bronchitis    CHF (congestive heart failure) (HCC) dx'd 2016   Coronary atherosclerosis of native coronary artery    a. s/p CABG 2005 Dr Dusty     Hyperlipidemia    Hypertension    Hypertensive heart disease    Kidney stones    Mixed hyperlipidemia    Nephrolithiasis 06/07/2015   Paroxysmal atrial fibrillation (HCC)    a. s/p Maze in 2005 b. recurrence in March 2016 with >8sec posttermination pauses s/p PPM implant     Pneumonia 1950   double   Presence of permanent cardiac pacemaker    Restrictive lung disease 03/03/2018   Sleep apnea    tested; mask ordered; couldn't afford it; will get it now (09/04/2015)   TIA (transient ischemic attack)    Type II diabetes mellitus (HCC)    Uterine cancer [facility])    age 44 with partial hysterectomy    Past Surgical History:  Procedure Laterality Date   AV NODE ABLATION N/A 03/28/2019   Procedure: AV NODE ABLATION;  Surgeon: Waddell Danelle ORN, MD;  Location: [facility] INVASIVE CV LAB;  Service: Cardiovascular;  Laterality: N/A;   CARDIAC CATHETERIZATION  2005   CORONARY ARTERY BYPASS GRAFT  2005   Dr Dusty with MAZE   CYSTOSCOPY WITH URETEROSCOPY, STONE BASKETRY AND STENT PLACEMENT Left 09/08/2014   Procedure: CYSTOSCOPY WITH URETEROSCOPY, STONE BASKETRY AND STENT PLACEMENT;  Surgeon: Belvie LITTIE Clara, MD;  Location: [facility] ORS;  Service: Urology;  Laterality: Left;   CYSTOSCOPY/URETEROSCOPY/HOLMIUM LASER/STENT PLACEMENT Left 09/15/2014   Procedure: CYSTOSCOPY/RETROGRADE/URETEROSCOPY/HOLMIUM LASER/ STONE EXTRACTION /STENT EXCHANGE;  Surgeon: Belvie LITTIE Clara, MD;  Location: [facility] ORS;  Service: Urology;  Laterality: Left;   HOLMIUM LASER APPLICATION Left 09/08/2014   Procedure: HOLMIUM LASER APPLICATION;  Surgeon: Belvie LITTIE Clara, MD;  Location: [facility] ORS;  Service: Urology;  Laterality: Left;   HOLMIUM LASER APPLICATION Left 09/15/2014   Procedure: HOLMIUM LASER APPLICATION;  Surgeon: Belvie LITTIE Clara, MD;  Location: [facility] ORS;  Service: Urology;  Laterality: Left;   INSERT / REPLACE / REMOVE PACEMAKER     LAPAROSCOPIC CHOLECYSTECTOMY     PERMANENT PACEMAKER INSERTION N/A 06/05/2014   MDT 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 She has never used smokeless tobacco. She reports that she does not drink alcohol  and does not use drugs.   Allergies  Allergen Reactions   Amiodarone       pulmonary fibrosis   Gabapentin  Swelling    edema   Torsemide  Nausea Only     Headache    Family History  Problem Relation Age of Onset   Hypertension Mother    Hyperlipidemia Mother    Heart disease Mother    Diabetes Father    Stroke Father    Heart disease Father    Hypertension Sister        3 sisters   Arthritis Brother    Heart attack Maternal Grandfather    Stroke Paternal Grandfather    Hypertension Daughter     Family history reviewed and not pertinent    Prior to Admission medications   Medication Sig Start Date End Date Taking? Authorizing Provider  apixaban  (ELIQUIS ) 5 MG TABS tablet TAKE ONE TABLET TWICE DAILY 08/03/23   Burnard Debby LABOR, MD  B Complex-Biotin-FA (B-COMPLEX PO) Take 1 capsule by mouth every morning.     [provider]  Biotin 1000 MCG tablet Take 1,000 mcg by mouth daily.    [provider]  Coenzyme Q10 (CO Q-10) 200 MG CAPS Take 200 mg by mouth daily.    [provider]  Evolocumab  (REPATHA  SURECLICK) 140 MG/ML SOAJ Inject 140 mg into the skin every 14 (fourteen) days. 09/25/23   Raford Riggs, MD  FARXIGA 10 MG TABS tablet Take 10 mg by mouth daily. 05/24/22   [provider]  furosemide  (LASIX ) 40 MG tablet Take 1 tablet (40 mg total) by mouth 2 (two) times daily. 09/25/23   Raford Riggs, MD  insulin  NPH-regular Human (70-30) 100 UNIT/ML injection Inject 40 Units into the skin 3 (three) times daily with meals.    [provider]  metolazone (ZAROXOLYN) 2.5 MG tablet Take 2.5 mg by mouth daily as needed (weight gain > 5lbs).    [provider]  metoprolol  succinate (TOPROL -XL) 50 MG 24 hr tablet Take 1 tablet (50 mg total) by mouth daily. 09/25/23   Raford Riggs, MD  OXYGEN  Inhale 2 L into the lungs continuous.    [provider]     Objective    Physical Exam: Vitals:   10/21/23 1658 10/21/23 1703 10/21/23 1800 10/21/23 1821  BP: (!) 171/69 (!) 171/69 (!) 164/61   Pulse:  (!) 58 (!) 59 62  Resp: 19 (!) 24 20 17   Temp:  (!) 97.4 F (36.3 C)     SpO2:  96% 99% 97%  Weight:      Height:        General: appears to be stated age; alert, oriented; mildly increased work of breathing. Skin: warm, dry, no rash Head:  AT/[place] Mouth:  Oral mucosa membranes appear moist, normal dentition Neck: supple; trachea midline Chest: Pacemaker present Heart:  RRR; did not appreciate any M/R/G Lungs: CTAB, did not appreciate any wheezes, rales, or rhonchi Abdomen: + BS; soft, ND, NT Vascular: 2+ pedal pulses b/l; 2+ radial pulses b/l Extremities: no peripheral edema, no muscle wasting    Labs on Admission: I have  personally reviewed following labs and imaging studies  CBC: Recent Labs  Lab 10/21/23 1514  WBC 11.1*  HGB 12.9  HCT 41.4  MCV 91.2  PLT 335   Basic Metabolic Panel: Recent Labs  Lab 10/21/23 1514  NA 137  K 3.5  CL 98  CO2 25  GLUCOSE 138*  BUN 19  CREATININE 1.06*  CALCIUM  9.3   GFR: Estimated Creatinine Clearance: 48.7 mL/min (A) (by C-G formula based on SCr of 1.06 mg/dL (H)). Liver Function Tests: No results for input(s): AST, ALT, ALKPHOS, BILITOT, PROT, ALBUMIN in the last 168 hours. No results for input(s): LIPASE, AMYLASE in the last 168 hours. No results for input(s): AMMONIA in the last 168 hours. Coagulation Profile: No results for input(s): INR, PROTIME in the last 168 hours. Cardiac Enzymes: No results for input(s): CKTOTAL, CKMB, CKMBINDEX, TROPONINI in the last 168 hours. BNP (last 3 results) No results for input(s): PROBNP in the last 8760 hours. HbA1C: No results for input(s): HGBA1C in the last 72 hours. CBG: No results for input(s): GLUCAP in the last 168 hours. Lipid Profile: No results for input(s): CHOL, HDL, LDLCALC, TRIG, CHOLHDL, LDLDIRECT in the last 72 hours. Thyroid  Function Tests: No results for input(s): TSH, T4TOTAL, FREET4, T3FREE, THYROIDAB in the last 72 hours. Anemia Panel: No results for input(s): VITAMINB12,  FOLATE, FERRITIN, TIBC, IRON, RETICCTPCT in the last 72 hours. Urine analysis:    Component Value Date/Time   COLORURINE YELLOW 04/05/2017 0134   APPEARANCEUR CLEAR 04/05/2017 0134   LABSPEC 1.021 04/05/2017 0134   PHURINE 5.0 04/05/2017 0134   GLUCOSEU 50 (A) 04/05/2017 0134   HGBUR NEGATIVE 04/05/2017 0134   BILIRUBINUR NEGATIVE 04/05/2017 0134   KETONESUR NEGATIVE 04/05/2017 0134   PROTEINUR NEGATIVE 04/05/2017 0134   UROBILINOGEN 1.0 07/30/2014 0001   NITRITE NEGATIVE 04/05/2017 0134   LEUKOCYTESUR TRACE (A) 04/05/2017 0134    Radiological Exams on Admission: CT Angio Chest PE W and/or Wo Contrast Result Date: 10/21/2023 CLINICAL DATA:  Pulmonary embolism (PE) suspected, high prob EXAM: CT ANGIOGRAPHY CHEST WITH CONTRAST TECHNIQUE: Multidetector CT imaging of the chest was performed using the standard protocol during bolus administration of intravenous contrast. Multiplanar CT image reconstructions and MIPs were obtained to evaluate the vascular anatomy. RADIATION DOSE REDUCTION: This exam was performed according to the departmental dose-optimization program which includes automated exposure control, adjustment of the mA and/or kV according to patient size and/or use of iterative reconstruction technique. CONTRAST:  60mL OMNIPAQUE  IOHEXOL  350 MG/ML SOLN COMPARISON:  December 14, 2021 FINDINGS: Pulmonary Embolism: No pulmonary embolism. Cardiovascular: Moderate cardiomegaly severe multi-vessel coronary atherosclerosis with postsurgical changes of a prior CABG.Left chest pacemaker with leads terminating in the right atrium and right ventricle.No aortic aneurysm. Diffuse aortic atherosclerosis. Mediastinum/Nodes: No mediastinal mass. Mildly enlarged multistation mediastinal lymph nodes, measuring up to 1.5 cm, likely reactive. Lungs/Pleura: The midline trachea and bronchi are patent. Similar appearance of the mosaic attenuation with an upper lobe predominance throughout the lungs.  Increasing ground-glass attenuation within the right upper lobe, right middle lobe, and basilar segments of the right lower lobe. Increasing ground-glass attenuation also noted in the lingula and left lower lobe. No pneumothorax. No pleural effusion. Musculoskeletal: No acute fracture or destructive bone lesion. Sternotomy wires. Multilevel degenerative disc disease of the spine. Upper Abdomen: No acute abnormality in the partially visualized upper abdomen. Reflux of contrast into the hepatic veins, consistent with underlying cardiac dysfunction. Review of the MIP images confirms the above findings. IMPRESSION: 1. Redemonstrated findings of  interstitial lung disease, probably reflecting changes from underlying sarcoidosis. Alternatively, drug-induced lung disease could be a possibility. Increasing ground-glass attenuation within the lungs, as described above, possibly artifactual from lower lung volumes versus multifocal pneumonia or pulmonary edema. 2. Cardiomegaly with findings of underlying cardiac dysfunction. 3. Unchanged, mildly enlarged multistation mediastinal lymph nodes, likely reactive. Aortic Atherosclerosis (ICD10-I70.0). Electronically Signed   By: Rogelia Myers M.D.   On: 10/21/2023 18:08      Assessment/Plan   Principal Problem:   Acute exacerbation of idiopathic pulmonary fibrosis (HCC) Active Problems:   DM2 (diabetes mellitus, type 2) (HCC)   Paroxysmal atrial fibrillation (HCC)   SOB (shortness of breath)   Leukocytosis   Chronic hypoxic respiratory failure (HCC)   Chronic diastolic CHF (congestive heart failure) (HCC)   History of essential hypertension     #) Acute exacerbation of interstitial lung disease: In the setting of a documented history of interstitial lung disease complicated by chronic hypoxic respiratory failure on 2 L continuous nasal cannula, diagnosis of acute exacerbation thereafter in the setting of 2 weeks of progressive shortness of breath, with  desaturation noted with ambulation in the emergency department, with oxygen  saturation noted to decrease to 80% on her baseline 2 L continuous nasal cannula.  At rest, she is maintaining oxygen  saturations in the mid to high 90s on her baseline 2 L continuously.  Today's CT chest with PE protocol shows interval increase in groundglass opacities relative to high-resolution CT chest from September 2023, potentially representing inflammatory process associated with exacerbation of her underlying interstitial lung disease.  Consequently, will pursue additional systemic steroids.    Per radiology read, interval increase in groundglass opacities per today's CTA chest may also be on the basis of an artifactual finding in the setting of the presence of low lung volumes, differentials including multifocal pneumonia versus pulmonary edema.  Clinically, presentation appears less suggestive of acute on chronic diastolic heart failure, while also noting that her presenting BNP is within her baseline range dating back to March 2016, as further quantified above . For now, we will resume her oral Lasix , further trend volume status, including further trending of BNP, as outlined below.  Will also pursue updated echocardiogram, as further detailed below.    Regarding the possibility of multifocal pneumonia in the setting of her increased groundglass opacities, presentation suggestive of increased shortness of breath, cough, with interval will blood cell count to 11,100.  For now, azithromycin  and Rocephin  were initiated in the ED today for potential CAP while monitoring for results of procalcitonin level to help guide decision making regarding need for further continuation of antibiotics.  Of note, in the absence of objective fever in the absence of white blood cell count greater than 12,000, SIRS criteria not currently met.  In the absence of SIRS criteria and in the absence of vomiting underlying infectious process, criteria  for sepsis are not currently met.  Of note, COVID, influenza, RSV PCR were all negative.  Considering other potential etiologies contributing to her presenting shortness of breath, ACS appears less likely in the absence of any recent chest pain, will presenting EKG shows no evidence of acute ischemic changes.  CTA chest showed no evidence of acute pulmonary embolism, and also demonstrated no evidence of pneumothorax.  Plan: Continue solumedrol, as above.  Scheduled DuoNeb Iser treatments..  Albuterol  nebulizers.  Monitor on telemetry.  Check VBG.  Repeat BMP in the morning.  Incentive spirometry, flutter valve.  Follow-up for results of procalcitonin level.  For now, continue azithromycin  and Rocephin , as above.  Repeat CBC in the morning.  Check serum magnesium  and phosphorus levels.  Echocardiogram in AM.  Resume outpatient Lasix .                       #) Leukocytosis: Presenting CBC reflects mildly elevated white cell count of 11,100.  Potentially reactive in nature in the context of CTA chest showing interval increasing groundglass opacities in the setting of a known history of underlying interstitial lung disease.  As noted above, radiology read today CTA chest includes the possibility of multifocal pneumonia for which patient was started on azithromycin  and Rocephin , with procalcitonin level, ordered to further assess for underlying pna, currently pending.   As noted above, in the absence of blood cell count greater than 12,000 and in the absence of objective fever, SIRS criteria not currently met.  Additionally, in the absence of overt underlying infectious process, criteria for sepsis are not currently met.  Consequently, blood cultures and lactic acid level have not been checked at this time.  For now, we will continue aforementioned azithromycin  and Rocephin  and closely monitor for result of procalcitonin level. Will also check urinalysis. Of note COVID, influenza, RSV PCR  were all found to be negative.  No evidence of additional underlying infectious process at this time.  It is noted that there may be interval increase in white blood cell count in the absence of worsening infection, rather on the basis of interval initiation of systemic corticosteroids to address her suspected acute exacerbation of underlying interstitial lung disease.  Plan: Repeat CBC with diff in the morning.  Monitor strict I's and O's, daily weights.  Follow-up for result of procalcitonin level, as above.  For now, continue azithromycin  and Rocephin , as further outlined above.  Check urinalysis.                        #) Chronic diastolic heart failure: documented history of such, with most recent echocardiogram performed in December 2023, which was notable for LVEF 55 to 60%, with additional results as conveyed above.  Clinically, acute on chronic diastolic heart failure appears less likely at this time, including BNP that is within her baseline range.  For now, we will continue her home Lasix  regimen and pursue further evaluation of underlying volume status as outlined below, as well as pursuit of updated echocardiogram as noted below.  Plan: monitor strict I's & O's and daily weights. Repeat CMP in AM. Check serum mag level. Continue home Lasix  40 mg p.o. twice daily, with next dose to occur now.  Echocardiogram in the morning.                     #) Essential Hypertension: documented h/o such, with outpatient antihypertensive regimen including metoprolol  succinate as well as Lasix  40 mg p.o. twice daily.  SBP's in the ED today: 140s 160s mmHg. will her current heart rate is noted to be in the 60s.  She has a pacemaker, with recent generator change on, which will prevent bradycardia in spite of plan for resumption of home beta-blocker.  Plan: Close monitoring of subsequent BP via routine VS. resume home Lasix  as well as beta-blocker, as above.  Monitor  strict I's and O's and daily weights.  Monitor on telemetry.                      #) Paroxysmal  atrial fibrillation: Documented history of such. In setting of CHA2DS2-VASc score of  7, there is an indication for chronic anticoagulation for thromboembolic prophylaxis. Consistent with this, patient is chronically anticoagulated on Eliquis .   Her history of paroxysmal atrial fibrillation is also notable for maze procedure in 2005, followed by recurrence of atrial fibrillation in 2016, noting the presence of sick sinus syndrome at the time of the latter, prompting placement of Medtronic pacemaker in March 2016, with ensuing generator change out on 05/27/2023.  Presenting EKG reflects ventricular paced rhythm, without overt evidence of acute ischemic changes.  Home AV nodal blocking regimen: Metoprolol  succinate 50 mg p.o. daily.  Most recent echocardiogram occurred in December 2023, findings notable for LVEF 55 to 60%, mild to moderate mitral stenosis, no evidence of additional significant valvular pathology, with additional results as conveyed above.   Plan: monitor strict I's & O's and daily weights. CMP/CBC in AM. Check serum mag level. Continue home AV nodal blocking regimen.  Eliquis .  Monitor on telemetry.  Follow-up for result of updated echocardiogram, which has been ordered for the morning, as above.                     #) Type 2 Diabetes Mellitus: documented history of such. Home insulin  regimen: 70/30 insulin , 40 units SQ 3 times daily. Home oral hypoglycemic agents: Dapagliflozin. presenting blood sugar: 138.  However, she is at risk for insulin  and relative hyperglycemia as a consequence of initiation of systemic corticosteroids as component of management of her presenting acute exacerbation of interstitial lung disease. most recent A1c noted to be 10.9% when checked in September 2020.  In considering starting dose for basal insulin  during this  hospitalization, her home 70/30 insulin  40 units SQ 3 times daily is approximately 80 units of long-acting insulin  per day or 40 units sq bid. in terms of initial dose of basal insulin  to be started during this hospitalization, will resume approximately half of outpatient dose in order to reduce risk for ensuing hypoglycemia  Plan: accuchecks QAC and HS with low dose SSI.  Levemir  12 units SQ twice daily, with first dose tonight.  Add on hemoglobin A1c level.     DVT prophylaxis: SCD's + continuation of home Eliquis  Code Status: Full code Family Communication: none Disposition Plan: Per Rounding Team Consults called: none;  Admission status: Observation     I SPENT GREATER THAN 75  MINUTES IN CLINICAL CARE TIME/MEDICAL DECISION-MAKING IN COMPLETING THIS ADMISSION.      Zareth Rippetoe B Timothee Gali DO [facility]  From 7PM - 7AM   10/21/2023, 8:11 PM

## 2023-10-21 NOTE — ED Triage Notes (Signed)
 Pt sent from doc today due to increased SOB over past week. Pt on 2L [place] baseline. CXR today showed increased cardiomegaly. Endorses cough.

## 2023-10-21 NOTE — ED Notes (Signed)
 Patient given drink and [place] sandwich meal.

## 2023-10-21 NOTE — ED Provider Notes (Signed)
 [place] EMERGENCY DEPARTMENT AT [facility] Provider Note  CSN: [id number] Arrival date & time: 10/21/23 1504  Chief Complaint(s) Shortness of Breath  HPI [name] is a 78 y.o. female with history of CHF, interstitial lung disease who is here today for shortness of breath over the last 2 weeks.  Patient uses 2 L of oxygen  at baseline, no increase in her O2.   Past Medical History Past Medical History:  Diagnosis Date   Asthmatic bronchitis    CHF (congestive heart failure) (HCC) dx'd 2016   Coronary atherosclerosis of native coronary artery    a. s/p CABG 2005 Dr Dusty     Hyperlipidemia    Hypertension    Hypertensive heart disease    Kidney stones    Mixed hyperlipidemia    Nephrolithiasis 06/07/2015   Paroxysmal atrial fibrillation (HCC)    a. s/p Maze in 2005 b. 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   Hypertension 05/03/2020   Atrial fibrillation with RVR (HCC) 12/08/2018   ILD (interstitial lung disease) (HCC) 09/23/2018   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   Restrictive lung disease 03/03/2018   Palpitations 06/07/2015   Pacemaker- March 2016  06/07/2015   Chronic anticoagulation 06/07/2015   Sleep apnea-unable to tolerate C-pap 06/07/2015   Nephrolithiasis 06/07/2015   Poorly controlled diabetes mellitus (HCC) 12/02/2014   Ureteral stone 09/08/2014   Preop cardiovascular exam 09/05/2014   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 06/05/2014   History of uterine cancer 06/04/2014    Personal history of transient ischemic attack (TIA) and 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 Authorizing Provider  apixaban  (ELIQUIS ) 5 MG TABS tablet TAKE ONE TABLET TWICE DAILY 08/03/23   Burnard Debby LABOR, MD  B Complex-Biotin-FA (B-COMPLEX PO) Take 1 capsule by mouth every morning.     [provider]  Biotin 1000 MCG tablet Take 1,000 mcg by mouth daily.    [provider]  Coenzyme Q10 (CO Q-10) 200 MG CAPS Take 200 mg by mouth daily.    [provider]  Evolocumab  (REPATHA  SURECLICK) 140 MG/ML SOAJ Inject 140 mg into the skin every 14 (fourteen) days. 09/25/23   Raford Riggs, MD  FARXIGA 10 MG TABS tablet Take 10 mg by mouth daily. 05/24/22   [provider]  furosemide  (LASIX ) 40 MG tablet Take 1 tablet (40 mg total) by mouth 2 (two) times daily. 09/25/23   Raford Riggs, MD  insulin  NPH-regular Human (70-30) 100 UNIT/ML injection Inject 40 Units into the skin 3 (three) times daily with meals.    [provider]  metolazone (ZAROXOLYN) 2.5 MG  tablet Take 2.5 mg by mouth daily as needed (weight gain > 5lbs).    [provider]  metoprolol  succinate (TOPROL -XL) 50 MG 24 hr tablet Take 1 tablet (50 mg total) by mouth daily. 09/25/23   Raford Riggs, MD  OXYGEN  Inhale 2 L into the lungs continuous.    [provider]                                                                                                                                    Past Surgical History Past Surgical History:  Procedure Laterality Date   AV NODE ABLATION N/A 03/28/2019   Procedure: AV NODE  ABLATION;  Surgeon: Waddell Danelle ORN, MD;  Location: [facility] INVASIVE CV LAB;  Service: Cardiovascular;  Laterality: N/A;   CARDIAC CATHETERIZATION  2005   CORONARY ARTERY BYPASS GRAFT  2005   Dr Dusty with MAZE   CYSTOSCOPY WITH URETEROSCOPY, STONE BASKETRY AND STENT PLACEMENT Left 09/08/2014   Procedure: CYSTOSCOPY WITH URETEROSCOPY, STONE BASKETRY AND STENT PLACEMENT;  Surgeon: Belvie LITTIE Clara, MD;  Location: [facility] ORS;  Service: Urology;  Laterality: Left;   CYSTOSCOPY/URETEROSCOPY/HOLMIUM LASER/STENT PLACEMENT Left 09/15/2014   Procedure: CYSTOSCOPY/RETROGRADE/URETEROSCOPY/HOLMIUM LASER/ STONE EXTRACTION /STENT EXCHANGE;  Surgeon: Belvie LITTIE Clara, MD;  Location: [facility] ORS;  Service: Urology;  Laterality: Left;   HOLMIUM LASER APPLICATION Left 09/08/2014   Procedure: HOLMIUM LASER APPLICATION;  Surgeon: Belvie LITTIE Clara, MD;  Location: [facility] OR[facility];  Service: Urology;  Laterality: Left;   HOLMIUM LASER APPLICATION Left 09/15/2014   Procedure: HOLMIUM LASER APPLICATION;  Surgeon: Belvie LITTIE Clara, MD;  Location: [facility] OR[facility];  Service: Urology;  Laterality: Left;   INSERT / REPLACE / REMOVE PACEMAKER     LAPAROSCOPIC CHOLECYSTECTOMY     PERMANENT PACEMAKER INSERTION N/A 06/05/2014   MDT Advisa 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AS[facility] CV LAB;  Service: Cardiovascular;  Laterality: N/A;   RIGHT HEART CATH N/A 02/26/2022   Procedure: RIGHT HEART CATH;  Surgeon: Gardenia Led, DO;  Location: [facility] INVASIVE CV LAB;  Service: Cardiovascular;  Laterality: N/A;   VAGINAL HYSTERECTOMY  1975   Family History Family History  Problem Relation Age of Onset   Hypertension Mother    Hyperlipidemia Mother    Heart disease Mother    Diabetes Father    Stroke Father    Heart disease Father    Hypertension Sister        3 sisters   Arthritis Brother    Heart  attack Maternal Grandfather    Stroke Paternal Grandfather    Hypertension Daughter     Social History Social History   Tobacco Use   Smoking status: Never   Smokeless tobacco: Never  Vaping Use   Vaping status: Never Used  Substance Use Topics  Alcohol  use: No   Drug use: No   Allergies Amiodarone , Gabapentin , and Torsemide   Review of Systems Review of Systems  Physical Exam Vital Signs  I have reviewed the triage vital signs BP (!) 164/61   Pulse 62   Temp (!) 97.4 F (36.3 C)   Resp 17   Ht 5' 3 (1.6 m)   Wt 95 kg   SpO2 97%   BMI 37.10 kg/m   Physical Exam Vitals and nursing note reviewed.  Constitutional:      Comments: Chronically ill-appearing  Cardiovascular:     Rate and Rhythm: Normal rate.  Pulmonary:     Effort: Pulmonary effort is normal.     Breath sounds: No decreased breath sounds, wheezing or rales.  Neurological:     General: No focal deficit present.     Mental Status: She is alert.     ED Results and Treatments Labs (all labs ordered are listed, but only abnormal results are displayed) Labs Reviewed  BASIC METABOLIC PANEL WITH GFR - Abnormal; Notable for the following components:      Result Value   Glucose, Bld 138 (*)    Creatinine, Ser 1.06 (*)    GFR, Estimated 54 (*)    All other components within normal limits  CBC - Abnormal; Notable for the following components:   WBC 11.1 (*)    All other components within normal limits  BRAIN NATRIURETIC PEPTIDE - Abnormal; Notable for the following components:   B Natriuretic Peptide 210.2 (*)    All other components within normal limits  RESP PANEL BY RT-PCR (RSV, FLU A&B, COVID)  RVPGX2  PROCALCITONIN                                                                                                                          Radiology CT Angio Chest PE W and/or Wo Contrast Result Date: 10/21/2023 CLINICAL DATA:  Pulmonary embolism (PE) suspected, high prob EXAM: CT ANGIOGRAPHY  CHEST WITH CONTRAST TECHNIQUE: Multidetector CT imaging of the chest was performed using the standard protocol during bolus administration of intravenous contrast. Multiplanar CT image reconstructions and MIPs were obtained to evaluate the vascular anatomy. RADIATION DOSE REDUCTION: This exam was performed according to the departmental dose-optimization program which includes automated exposure control, adjustment of the mA and/or kV according to patient size and/or use of iterative reconstruction technique. CONTRAST:  60mL OMNIPAQUE  IOHEXOL  350 MG/ML SOLN COMPARISON:  December 14, 2021 FINDINGS: Pulmonary Embolism: No pulmonary embolism. Cardiovascular: Moderate cardiomegaly severe multi-vessel coronary atherosclerosis with postsurgical changes of a prior CABG.Left chest pacemaker with leads terminating in the right atrium and right ventricle.No aortic aneurysm. Diffuse aortic atherosclerosis. Mediastinum/Nodes: No mediastinal mass. Mildly enlarged multistation mediastinal lymph nodes, measuring up to 1.5 cm, likely reactive. Lungs/Pleura: The midline trachea and bronchi are patent. Similar appearance of the mosaic attenuation with an upper lobe predominance throughout the lungs. Increasing ground-glass attenuation within the right upper lobe, right middle lobe, and basilar  segments of the right lower lobe. Increasing ground-glass attenuation also noted in the lingula and left lower lobe. No pneumothorax. No pleural effusion. Musculoskeletal: No acute fracture or destructive bone lesion. Sternotomy wires. Multilevel degenerative disc disease of the spine. Upper Abdomen: No acute abnormality in the partially visualized upper abdomen. Reflux of contrast into the hepatic veins, consistent with underlying cardiac dysfunction. Review of the MIP images confirms the above findings. IMPRESSION: 1. 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 Alternatively,  drug-induced lung disease could be a possibility. Increasing ground-glass attenuation within the lungs, as described above, possibly artifactual from lower lung volumes versus multifocal pneumonia or pulmonary edema. 2. Cardiomegaly with findings of underlying cardiac dysfunction. 3. Unchanged, mildly enlarged multistation mediastinal lymph nodes, likely reactive. Aortic Atherosclerosis (ICD10-I70.0). Electronically Signed   By: Rogelia Myers M.D.   On: 10/21/2023 18:08    Pertinent labs & imaging results that were available during my care of the patient were reviewed by me and considered in my medical decision making (see MDM for details).  Medications Ordered in ED Medications  ondansetron  (ZOFRAN ) injection 4 mg (4 mg Intravenous Not Given 10/21/23 1809)  azithromycin  (ZITHROMAX ) 500 mg in sodium chloride  0.9 % 250 mL IVPB (500 mg Intravenous New Bag/Given 10/21/23 1924)  iohexol  (OMNIPAQUE ) 350 MG/ML injection 60 mL (60 mLs Intravenous Contrast Given 10/21/23 1744)  ipratropium-albuterol  (DUONEB) 0.5-2.5 (3) MG/3ML nebulizer solution 3 mL (3 mLs Nebulization Given 10/21/23 1919)  methylPREDNISolone  sodium succinate (SOLU-MEDROL ) 125 mg/2 mL injection 125 mg (125 mg Intravenous Given 10/21/23 1919)  cefTRIAXone  (ROCEPHIN ) 1 g in sodium chloride  0.9 % 100 mL IVPB (0 g Intravenous Stopped 10/21/23 1927)                                                                                                                                     Procedures Procedures  (including critical care time)  Medical Decision Making / ED Course   This patient presents to the ED for concern of shortness of breath, this involves an extensive number of treatment options, and is a complaint that carries with it a high risk of complications and morbidity.  The differential diagnosis includes interstitial lung disease, heart failure exacerbation, pneumonia, PE.  MDM: Patient with multiple issues which could be contributing to  her shortness of breath.  Will obtain imaging of the patient's chest, BNP and troponin ordered.  COVID swab ordered.  Patient is at her baseline for oxygen .  Reassessment 7:05 PM-patient CTA bit of a mixed picture.  Question of some pulmonary edema versus multifocal pneumonia, no PE.  Unclear if the patient has a pneumonia, has a mild leukocytosis but no white count, no fever.  With her interstitial lung disease, overall it is a difficult picture to interpret.  From a functional standpoint, we ambulated the patient to the bathroom.  Patient could barely make it to the bathroom while  on her 2 L of oxygen , when she returned to the bed, her O2 sat was in the low 80s.  This does represent a significant decline over the last couple of weeks.  Given that her etiology for her shortness of breath is likely multifactorial, we will start the patient on some steroids, give her a DuoNeb, and start her on commune acquired pneumonia antibiotics.  I have added on a procalcitonin.  Will admit patient to hospitalist.   Additional history obtained: -Additional history obtained from family at bedside -External records from outside source obtained and reviewed including: Chart review including previous notes, labs, imaging, consultation notes   Lab Tests: -I ordered, reviewed, and interpreted labs.   The pertinent results include:   Labs Reviewed  BASIC METABOLIC PANEL WITH GFR - Abnormal; Notable for the following components:      Result Value   Glucose, Bld 138 (*)    Creatinine, Ser 1.06 (*)    GFR, Estimated 54 (*)    All other components within normal limits  CBC - Abnormal; Notable for the following components:   WBC 11.1 (*)    All other components within normal limits  BRAIN NATRIURETIC PEPTIDE - Abnormal; Notable for the following components:   B Natriuretic Peptide 210.2 (*)    All other components within normal limits  RESP PANEL BY RT-PCR (RSV, FLU A&B, COVID)  RVPGX2  PROCALCITONIN       EKG ventricular paced rhythm, no ischemia.  EKG Interpretation Date/Time:  Wednesday October 21 2023 15:16:47 EDT Ventricular Rate:  60 PR Interval:    QRS Duration:  174 QT Interval:  518 QTC Calculation: 518 R Axis:   -88  Text Interpretation: Ventricular-paced rhythm Abnormal ECG When compared with ECG of 02-Sep-2023 14:33, PREVIOUS ECG IS PRESENT Confirmed by Mannie Pac [phone]) on 10/21/2023 5:54:10 PM         Imaging Studies ordered: I ordered imaging studies including CTA chest I independently visualized and interpreted imaging. I agree with the radiologist interpretation   Medicines ordered and prescription drug management: Meds ordered this encounter  Medications   iohexol  (OMNIPAQUE ) 350 MG/ML injection 60 mL   ondansetron  (ZOFRAN ) injection 4 mg   ipratropium-albuterol  (DUONEB) 0.5-2.5 (3) MG/3ML nebulizer solution 3 mL   methylPREDNISolone  sodium succinate (SOLU-MEDROL ) 125 mg/2 mL injection 125 mg    IV methylprednisolone  will be converted to either a q12h or q24h frequency with the same total daily dose (TDD).  Ordered Dose: 1 to 125 mg TDD; convert to: TDD q24h.  Ordered Dose: 126 to 250 mg TDD; convert to: TDD div q12h.  Ordered Dose: >250 mg TDD; DAW.   cefTRIAXone  (ROCEPHIN ) 1 g in sodium chloride  0.9 % 100 mL IVPB    Antibiotic Indication::   CAP   azithromycin  (ZITHROMAX ) 500 mg in sodium chloride  0.9 % 250 mL IVPB    -I have reviewed the patients home medicines and have made adjustments as needed   Cardiac Monitoring: The patient was maintained on a cardiac monitor.  I personally viewed and interpreted the cardiac monitored which showed an underlying rhythm of: Normal sinus rhythm  Social Determinants of Health:  Factors impacting patients care include: Multiple medical comorbidities including interstitial lung disease, heart failure   Reevaluation: After the interventions noted above, I reevaluated the patient and found that they have  :stayed the same  Co morbidities that complicate the patient evaluation  Past Medical History:  Diagnosis Date   Asthmatic bronchitis    CHF (congestive  heart failure) (HCC) dx'd 2016   Coronary atherosclerosis of native coronary artery    a. s/p CABG 2005 Dr Dusty     Hyperlipidemia    Hypertension    Hypertensive heart disease    Kidney stones    Mixed hyperlipidemia    Nephrolithiasis 06/07/2015   Paroxysmal atrial fibrillation (HCC)    a. s/p Maze in 2005 b. recurrence in March 2016 with >8sec posttermination pauses s/p PPM implant     Pneumonia 1950   double   Presence of permanent cardiac pacemaker    Restrictive lung disease 03/03/2018   Sleep apnea    tested; mask ordered; couldn't afford it; will get it now (09/04/2015)   TIA (transient ischemic attack)    Type II diabetes mellitus (HCC)    Uterine cancer [facility])    age 51 with partial hysterectomy        Final Clinical Impression(s) / ED Diagnoses Final diagnoses:  Hypoxia  Shortness of breath  Acute on chronic hypoxic respiratory failure (HCC)     @PCDICTATION @    Mannie Pac T, DO 10/21/23 1938

## 2023-10-21 NOTE — ED Triage Notes (Signed)
 Pt arrives POV c/o increased shob x 2 weeks. Pt went to cardiologist apt today and was sent here for a workup. Echo scheduled for next week. 2L[place] baseline

## 2023-10-21 NOTE — ED Notes (Signed)
 Pt ambulated to and from bathroom with O2 supply 2 lpm via nasal canula and monitored, pt was unable to catch her breath on the way back to the room. Pt O2 saturation fell to 87%, MD witnessed and aware.

## 2023-10-22 ENCOUNTER — Observation Stay (HOSPITAL_COMMUNITY)

## 2023-10-22 DIAGNOSIS — Z8673 Personal history of transient ischemic attack (TIA), and cerebral infarction without residual deficits: Secondary | ICD-10-CM | POA: Diagnosis not present

## 2023-10-22 DIAGNOSIS — Z6837 Body mass index (BMI) 37.0-37.9, adult: Secondary | ICD-10-CM | POA: Diagnosis not present

## 2023-10-22 DIAGNOSIS — Z950 Presence of cardiac pacemaker: Secondary | ICD-10-CM | POA: Diagnosis not present

## 2023-10-22 DIAGNOSIS — I5031 Acute diastolic (congestive) heart failure: Secondary | ICD-10-CM

## 2023-10-22 DIAGNOSIS — Z7901 Long term (current) use of anticoagulants: Secondary | ICD-10-CM | POA: Diagnosis not present

## 2023-10-22 DIAGNOSIS — Z794 Long term (current) use of insulin: Secondary | ICD-10-CM | POA: Diagnosis not present

## 2023-10-22 DIAGNOSIS — J9611 Chronic respiratory failure with hypoxia: Secondary | ICD-10-CM | POA: Diagnosis not present

## 2023-10-22 DIAGNOSIS — R0602 Shortness of breath: Secondary | ICD-10-CM | POA: Diagnosis present

## 2023-10-22 DIAGNOSIS — D72829 Elevated white blood cell count, unspecified: Secondary | ICD-10-CM | POA: Diagnosis present

## 2023-10-22 DIAGNOSIS — Z8679 Personal history of other diseases of the circulatory system: Secondary | ICD-10-CM | POA: Diagnosis not present

## 2023-10-22 DIAGNOSIS — Z79899 Other long term (current) drug therapy: Secondary | ICD-10-CM | POA: Diagnosis not present

## 2023-10-22 DIAGNOSIS — J9621 Acute and chronic respiratory failure with hypoxia: Secondary | ICD-10-CM | POA: Diagnosis present

## 2023-10-22 DIAGNOSIS — M6281 Muscle weakness (generalized): Secondary | ICD-10-CM | POA: Diagnosis present

## 2023-10-22 DIAGNOSIS — I2510 Atherosclerotic heart disease of native coronary artery without angina pectoris: Secondary | ICD-10-CM | POA: Diagnosis present

## 2023-10-22 DIAGNOSIS — R5381 Other malaise: Secondary | ICD-10-CM | POA: Diagnosis present

## 2023-10-22 DIAGNOSIS — Z8542 Personal history of malignant neoplasm of other parts of uterus: Secondary | ICD-10-CM | POA: Diagnosis not present

## 2023-10-22 DIAGNOSIS — J84112 Idiopathic pulmonary fibrosis: Secondary | ICD-10-CM | POA: Diagnosis not present

## 2023-10-22 DIAGNOSIS — E66812 Obesity, class 2: Secondary | ICD-10-CM | POA: Diagnosis present

## 2023-10-22 DIAGNOSIS — Z9981 Dependence on supplemental oxygen: Secondary | ICD-10-CM | POA: Diagnosis not present

## 2023-10-22 DIAGNOSIS — Z951 Presence of aortocoronary bypass graft: Secondary | ICD-10-CM | POA: Diagnosis not present

## 2023-10-22 DIAGNOSIS — I480 Paroxysmal atrial fibrillation: Secondary | ICD-10-CM | POA: Diagnosis present

## 2023-10-22 DIAGNOSIS — E1165 Type 2 diabetes mellitus with hyperglycemia: Secondary | ICD-10-CM | POA: Diagnosis present

## 2023-10-22 DIAGNOSIS — I5032 Chronic diastolic (congestive) heart failure: Secondary | ICD-10-CM | POA: Diagnosis not present

## 2023-10-22 DIAGNOSIS — I110 Hypertensive heart disease with heart failure: Secondary | ICD-10-CM | POA: Diagnosis present

## 2023-10-22 DIAGNOSIS — I5033 Acute on chronic diastolic (congestive) heart failure: Secondary | ICD-10-CM | POA: Diagnosis present

## 2023-10-22 DIAGNOSIS — J849 Interstitial pulmonary disease, unspecified: Secondary | ICD-10-CM | POA: Diagnosis not present

## 2023-10-22 DIAGNOSIS — E119 Type 2 diabetes mellitus without complications: Secondary | ICD-10-CM | POA: Diagnosis not present

## 2023-10-22 DIAGNOSIS — Z833 Family history of diabetes mellitus: Secondary | ICD-10-CM | POA: Diagnosis not present

## 2023-10-22 DIAGNOSIS — Z8249 Family history of ischemic heart disease and other diseases of the circulatory system: Secondary | ICD-10-CM | POA: Diagnosis not present

## 2023-10-22 LAB — GLUCOSE, CAPILLARY
Glucose-Capillary: 203 mg/dL — ABNORMAL HIGH (ref 70–99)
Glucose-Capillary: 281 mg/dL — ABNORMAL HIGH (ref 70–99)
Glucose-Capillary: 304 mg/dL — ABNORMAL HIGH (ref 70–99)
Glucose-Capillary: 318 mg/dL — ABNORMAL HIGH (ref 70–99)

## 2023-10-22 LAB — CBC WITH DIFFERENTIAL/PLATELET
Abs Immature Granulocytes: 0.03 K/uL (ref 0.00–0.07)
Basophils Absolute: 0 K/uL (ref 0.0–0.1)
Basophils Relative: 0 %
Eosinophils Absolute: 0 K/uL (ref 0.0–0.5)
Eosinophils Relative: 0 %
HCT: 37.1 % (ref 36.0–46.0)
Hemoglobin: 11.6 g/dL — ABNORMAL LOW (ref 12.0–15.0)
Immature Granulocytes: 0 %
Lymphocytes Relative: 7 %
Lymphs Abs: 0.7 K/uL (ref 0.7–4.0)
MCH: 28 pg (ref 26.0–34.0)
MCHC: 31.3 g/dL (ref 30.0–36.0)
MCV: 89.6 fL (ref 80.0–100.0)
Monocytes Absolute: 0.1 K/uL (ref 0.1–1.0)
Monocytes Relative: 1 %
Neutro Abs: 9.1 K/uL — ABNORMAL HIGH (ref 1.7–7.7)
Neutrophils Relative %: 92 %
Platelets: 289 K/uL (ref 150–400)
RBC: 4.14 MIL/uL (ref 3.87–5.11)
RDW: 15.4 % (ref 11.5–15.5)
WBC: 9.9 K/uL (ref 4.0–10.5)
nRBC: 0 % (ref 0.0–0.2)

## 2023-10-22 LAB — BLOOD GAS, VENOUS
Acid-Base Excess: 6.5 mmol/L — ABNORMAL HIGH (ref 0.0–2.0)
Bicarbonate: 34.5 mmol/L — ABNORMAL HIGH (ref 20.0–28.0)
O2 Saturation: 93.3 %
Patient temperature: 36.3
pCO2, Ven: 62 mmHg — ABNORMAL HIGH (ref 44–60)
pH, Ven: 7.35 (ref 7.25–7.43)
pO2, Ven: 63 mmHg — ABNORMAL HIGH (ref 32–45)

## 2023-10-22 LAB — URINALYSIS, COMPLETE (UACMP) WITH MICROSCOPIC
Bacteria, UA: NONE SEEN
Bilirubin Urine: NEGATIVE
Glucose, UA: >=500 mg/dL — AB
Hgb urine dipstick: NEGATIVE
Ketones, ur: NEGATIVE mg/dL
Leukocytes,Ua: NEGATIVE
Nitrite: NEGATIVE
Protein, ur: NEGATIVE mg/dL
Specific Gravity, Urine: 1.037 — ABNORMAL HIGH (ref 1.005–1.030)
pH: 5 (ref 5.0–8.0)

## 2023-10-22 LAB — ECHOCARDIOGRAM COMPLETE
AR max vel: 2.56 cm2
AV Area VTI: 2.84 cm2
AV Area mean vel: 2.73 cm2
AV Mean grad: 10 mmHg
AV Peak grad: 18.6 mmHg
Ao pk vel: 2.16 m/s
Area-P 1/2: 1.62 cm2
Height: 63 in
MV VTI: 1.81 cm2
S' Lateral: 2.5 cm
Weight: 3368.63 [oz_av]

## 2023-10-22 LAB — BRAIN NATRIURETIC PEPTIDE: B Natriuretic Peptide: 265.7 pg/mL — ABNORMAL HIGH (ref 0.0–100.0)

## 2023-10-22 LAB — COMPREHENSIVE METABOLIC PANEL WITH GFR
ALT: 14 U/L (ref 0–44)
AST: 23 U/L (ref 15–41)
Albumin: 2.9 g/dL — ABNORMAL LOW (ref 3.5–5.0)
Alkaline Phosphatase: 63 U/L (ref 38–126)
Anion gap: 12 (ref 5–15)
BUN: 22 mg/dL (ref 8–23)
CO2: 28 mmol/L (ref 22–32)
Calcium: 8.9 mg/dL (ref 8.9–10.3)
Chloride: 97 mmol/L — ABNORMAL LOW (ref 98–111)
Creatinine, Ser: 1.16 mg/dL — ABNORMAL HIGH (ref 0.44–1.00)
GFR, Estimated: 49 mL/min — ABNORMAL LOW (ref >60)
Glucose, Bld: 210 mg/dL — ABNORMAL HIGH (ref 70–99)
Potassium: 3.7 mmol/L (ref 3.5–5.1)
Sodium: 137 mmol/L (ref 135–145)
Total Bilirubin: 0.8 mg/dL (ref 0.0–1.2)
Total Protein: 7.2 g/dL (ref 6.5–8.1)

## 2023-10-22 LAB — PHOSPHORUS: Phosphorus: 4.8 mg/dL — ABNORMAL HIGH (ref 2.5–4.6)

## 2023-10-22 LAB — MAGNESIUM: Magnesium: 2 mg/dL (ref 1.7–2.4)

## 2023-10-22 MED ORDER — FUROSEMIDE 10 MG/ML IJ SOLN
40.0000 mg | Freq: Once | INTRAMUSCULAR | Status: AC
  Administered 2023-10-22: 40 mg via INTRAVENOUS
  Filled 2023-10-22: qty 4

## 2023-10-22 MED ORDER — AZITHROMYCIN 250 MG PO TABS
500.0000 mg | ORAL_TABLET | Freq: Every day | ORAL | Status: DC
  Administered 2023-10-22 – 2023-10-23 (×2): 500 mg via ORAL
  Filled 2023-10-22 (×2): qty 2

## 2023-10-22 MED ORDER — PERFLUTREN LIPID MICROSPHERE
1.0000 mL | INTRAVENOUS | Status: AC | PRN
  Administered 2023-10-22: 3 mL via INTRAVENOUS

## 2023-10-22 MED ORDER — METHYLPREDNISOLONE SODIUM SUCC 40 MG IJ SOLR
40.0000 mg | Freq: Two times a day (BID) | INTRAMUSCULAR | Status: DC
  Administered 2023-10-22 – 2023-10-23 (×2): 40 mg via INTRAVENOUS
  Filled 2023-10-22 (×2): qty 1

## 2023-10-22 NOTE — Evaluation (Signed)
 Physical Therapy Evaluation Patient Details Name: [name] MRN: [id number] DOB: 03-Aug-1945 Today's Date: 10/22/2023  History of Present Illness  78 yo female adm 10/21/23 with exacerbation of ILD. PMHx: chronic resp failure on 2L, ILD, CHF, CAD s/p CABG, PAF, SSS s/p PPM, T2DM  Clinical Impression  Pt presents with decreased functional endurance due to ILD. Pt to benefit from acute PT to address deficits. Pt ambulated short household distance and required titration of O2 from 2L to 4L to prevent desat. Pt to benefit from continued PT services to improve activity tolerance. PT to progress mobility as tolerated, and will continue to follow acutely.   SpO2 78% on 2L while ambulating. Tritrate to 4L and SpO2 was 92%. Back down to 2L at rest - SpO2 94%     If plan is discharge home, recommend the following: A little help with walking and/or transfers;A little help with bathing/dressing/bathroom;Assistance with cooking/housework;Assist for transportation;Help with stairs or ramp for entrance   Can travel by private vehicle        Equipment Recommendations None recommended by PT  Recommendations for Other Services       Functional Status Assessment Patient has had a recent decline in their functional status and demonstrates the ability to make significant improvements in function in a reasonable and predictable amount of time.     Precautions / Restrictions        Mobility  Bed Mobility Overal bed mobility: Needs Assistance Bed Mobility: Supine to Sit     Supine to sit: HOB elevated, Contact guard     General bed mobility comments: For safety    Transfers Overall transfer level: Needs assistance Equipment used: Rolling walker (2 wheels) Transfers: Sit to/from Stand Sit to Stand: Min assist           General transfer comment: Assist to rise and steady    Ambulation/Gait Ambulation/Gait assistance: Min assist Gait Distance (Feet): 70 Feet Assistive device:  Rolling walker (2 wheels) Gait Pattern/deviations: Step-through pattern, Decreased stride length, Trunk flexed Gait velocity: Decreased     General Gait Details: Cues to stay close to RW. Standing break halfway  Stairs            Wheelchair Mobility     Tilt Bed    Modified Rankin (Stroke Patients Only)       Balance Overall balance assessment: Needs assistance Sitting-balance support: Feet supported, Bilateral upper extremity supported Sitting balance-Leahy Scale: Good Sitting balance - Comments: Limited by fatigue   Standing balance support: Reliant on assistive device for balance, During functional activity, Bilateral upper extremity supported Standing balance-Leahy Scale: Fair Standing balance comment: Assist to steady and reliant on RW                             Pertinent Vitals/Pain Pain Assessment Pain Assessment: No/denies pain    Home Living Family/patient expects to be discharged to:: Private residence Living Arrangements: Spouse/significant other;Children Available Help at Discharge: Family;Neighbor (Difficult to schedule for help) Type of Home: House Home Access: Ramped entrance     Alternate Level Stairs-Number of Steps: Laundry in basement Home Layout: Two level Home Equipment: Educational psychologist (4 wheels)      Prior Function Prior Level of Function : Independent/Modified Independent             Mobility Comments: Bed at home can elevate HOB ADLs Comments: Helps husband as he is in a WC. Daughter takes her to  Dr but needs an assistant to help as well. Daughter has 2 fx vertebrae so cannot help as much     Extremity/Trunk Assessment   Upper Extremity Assessment Upper Extremity Assessment: Generalized weakness    Lower Extremity Assessment Lower Extremity Assessment: Generalized weakness    Cervical / Trunk Assessment Cervical / Trunk Assessment: Normal  Communication   Communication Communication:  Impaired Factors Affecting Communication: Hearing impaired    Cognition Arousal: Alert Behavior During Therapy: WFL for tasks assessed/performed   PT - Cognitive impairments: No apparent impairments                         Following commands: Intact       Cueing Cueing Techniques: Verbal cues, Gestural cues     General Comments General comments (skin integrity, edema, etc.): SpO2 78% on 2L while ambulating. Tritrate to 4L and SpO2 was 94%. Back down to 2L at rest - SpO2 94%    Exercises General Exercises - Lower Extremity Ankle Circles/Pumps: AROM, Both, 10 reps, Seated Long Arc Quad: AROM, Both, 10 reps, Seated Hip Flexion/Marching: AROM, 10 reps, Both, Seated   Assessment/Plan    PT Assessment Patient needs continued PT services  PT Problem List Cardiopulmonary status limiting activity;Decreased mobility;Decreased activity tolerance;Decreased balance       PT Treatment Interventions Gait training;Functional mobility training;Therapeutic activities    PT Goals (Current goals can be found in the Care Plan section)  Acute Rehab PT Goals Patient Stated Goal: Return home PT Goal Formulation: With patient Time For Goal Achievement: 11/05/23 Potential to Achieve Goals: Good    Frequency Min 3X/week     Co-evaluation               AM-PAC PT 6 Clicks Mobility  Outcome Measure Help needed turning from your back to your side while in a flat bed without using bedrails?: A Little Help needed moving from lying on your back to sitting on the side of a flat bed without using bedrails?: A Little Help needed moving to and from a bed to a chair (including a wheelchair)?: A Little Help needed standing up from a chair using your arms (e.g., wheelchair or bedside chair)?: A Little Help needed to walk in hospital room?: A Little Help needed climbing 3-5 steps with a railing? : A Lot 6 Click Score: 17    End of Session Equipment Utilized During Treatment: Gait  belt Activity Tolerance: Patient limited by fatigue Patient left: in chair;with call bell/phone within reach Nurse Communication: Mobility status PT Visit Diagnosis: Muscle weakness (generalized) (M62.81);Unsteadiness on feet (R26.81)    Time: 8880-8852 PT Time Calculation (min) (ACUTE ONLY): 28 min   Charges:   PT Evaluation $PT Eval Moderate Complexity: 1 Mod PT Treatments $Gait Training: 8-22 mins PT General Charges $$ ACUTE PT VISIT: 1 Visit         Quintin Campi, SPT  Acute Rehab  [phone]   Quintin Campi 10/22/2023, 12:01 PM

## 2023-10-22 NOTE — Plan of Care (Signed)
  Problem: Education: Goal: Knowledge of General Education information will improve Description: Including pain rating scale, medication(s)/side effects and non-pharmacologic comfort measures Outcome: Progressing   Problem: Clinical Measurements: Goal: Ability to maintain clinical measurements within normal limits will improve Outcome: Progressing   Problem: Clinical Measurements: Goal: Will remain free from infection Outcome: Progressing   Problem: Clinical Measurements: Goal: Diagnostic test results will improve Outcome: Progressing   Problem: Elimination: Goal: Will not experience complications related to bowel motility Outcome: Progressing   Problem: Education: Goal: Ability to describe self-care measures that may prevent or decrease complications (Diabetes Survival Skills Education) will improve Outcome: Progressing   Problem: Skin Integrity: Goal: Risk for impaired skin integrity will decrease Outcome: Progressing

## 2023-10-22 NOTE — Plan of Care (Signed)
   Problem: Education: Goal: Knowledge of General Education information will improve Description: Including pain rating scale, medication(s)/side effects and non-pharmacologic comfort measures Outcome: Progressing   Problem: Health Behavior/Discharge Planning: Goal: Ability to manage health-related needs will improve Outcome: Progressing   Problem: Clinical Measurements: Goal: Ability to maintain clinical measurements within normal limits will improve Outcome: Progressing Goal: Will remain free from infection Outcome: Progressing Goal: Diagnostic test results will improve Outcome: Progressing Goal: Respiratory complications will improve Outcome: Progressing Goal: Cardiovascular complication will be avoided Outcome: Progressing   Problem: Activity: Goal: Risk for activity intolerance will decrease Outcome: Progressing   Problem: Nutrition: Goal: Adequate nutrition will be maintained Outcome: Progressing   Problem: Coping: Goal: Level of anxiety will decrease Outcome: Progressing   Problem: Elimination: Goal: Will not experience complications related to bowel motility Outcome: Progressing Goal: Will not experience complications related to urinary retention Outcome: Progressing   Problem: Pain Managment: Goal: General experience of comfort will improve and/or be controlled Outcome: Progressing   Problem: Safety: Goal: Ability to remain free from injury will improve Outcome: Progressing   Problem: Skin Integrity: Goal: Risk for impaired skin integrity will decrease Outcome: Progressing   Problem: Education: Goal: Ability to describe self-care measures that may prevent or decrease complications (Diabetes Survival Skills Education) will improve Outcome: Progressing Goal: Individualized Educational Video(s) Outcome: Progressing   Problem: Coping: Goal: Ability to adjust to condition or change in health will improve Outcome: Progressing   Problem: Fluid  Volume: Goal: Ability to maintain a balanced intake and output will improve Outcome: Progressing   Problem: Health Behavior/Discharge Planning: Goal: Ability to identify and utilize available resources and services will improve Outcome: Progressing Goal: Ability to manage health-related needs will improve Outcome: Progressing   Problem: Metabolic: Goal: Ability to maintain appropriate glucose levels will improve Outcome: Progressing   Problem: Nutritional: Goal: Maintenance of adequate nutrition will improve Outcome: Progressing Goal: Progress toward achieving an optimal weight will improve Outcome: Progressing   Problem: Skin Integrity: Goal: Risk for impaired skin integrity will decrease Outcome: Progressing   Problem: Tissue Perfusion: Goal: Adequacy of tissue perfusion will improve Outcome: Progressing   Problem: Activity: Goal: Ability to tolerate increased activity will improve Outcome: Progressing   Problem: Clinical Measurements: Goal: Ability to maintain a body temperature in the normal range will improve Outcome: Progressing   Problem: Respiratory: Goal: Ability to maintain adequate ventilation will improve Outcome: Progressing Goal: Ability to maintain a clear airway will improve Outcome: Progressing

## 2023-10-22 NOTE — Hospital Course (Signed)
 78 y.o. female with medical history significant for chronic hypoxic respiratory failure on 2 L continuous nasal cannula, interstitial lung disease, chronic diastolic heart failure, coronary artery disease status post CABG in 2005, paroxysmal atrial fibrillation complicated with sick sinus syndrome status post pacemaker placement in March 2016, type 2 diabetes mellitus, who is admitted to [facility] on 10/21/2023 with suspected acute exacerbation of interstitial lung disease after presenting from home to [facility] ED complaining of shortness of breath.    Assessment and Plan:   Acute exacerbation of chronic interstitial lung disease - Worsening shortness of breath on exertion.  CT noting underlying ILD with increase in groundglass opacities.  Possible underlying congestion given worsening LE edema.  Will continue IV Solu-Medrol , 40 mg every 12 hours.  Scheduled nebulizers.  Will give trial of Lasix  40 mg IV daily (discontinue p.o.).  Monitor O2 closely.  Check CBC in AM.   Chronic hypoxic respiratory failure - Patient currently on 2 L nasal cannula which is her baseline.  Possibility that patient may need increased O2 at baseline.  Follow closely with PCP/pulmonology in the outpatient setting.   Acute exacerbation of chronic HFpEF - Previous EF 55-60%.  Mildly elevated BNP.  Imaging personally reviewed noting mild congestion.  Will give trial of IV Lasix  as above.  Monitor O2.  Monitor urine output and recheck BMP in AM.   Hypertension - Zoom home medication regimen.   Paroxysmal atrial fibrillation - Continue Eliquis , metoprolol .   Physical debilitation muscle weakness - Likely exacerbated by above.  Will order PT consult.

## 2023-10-22 NOTE — Progress Notes (Signed)
 Progress Note   Patient: [name] FMW:[id number] DOB: Apr 04, 1945 DOA: 10/21/2023  DOS: the patient was seen and examined on 10/22/2023   Brief hospital course:  78 y.o. female with medical history significant for chronic hypoxic respiratory failure on 2 L continuous nasal cannula, interstitial lung disease, chronic diastolic heart failure, coronary artery disease status post CABG in 2005, paroxysmal atrial fibrillation complicated with sick sinus syndrome status post pacemaker placement in March 2016, type 2 diabetes mellitus, who is admitted to [facility] on 10/21/2023 with suspected acute exacerbation of interstitial lung disease after presenting from home to [facility] ED complaining of shortness of breath.   Assessment and Plan:  Acute exacerbation of chronic interstitial lung disease - Worsening shortness of breath on exertion.  CT noting underlying ILD with increase in groundglass opacities.  Possible underlying congestion given worsening LE edema.  Will continue IV Solu-Medrol , 40 mg every 12 hours.  Scheduled nebulizers.  Will give trial of Lasix  40 mg IV daily (discontinue p.o.).  Monitor O2 closely.  Check CBC in AM.  Chronic hypoxic respiratory failure - Patient currently on 2 L nasal cannula which is her baseline.  Possibility that patient may need increased O2 at baseline.  Follow closely with PCP/pulmonology in the outpatient setting.  Acute exacerbation of chronic HFpEF - Previous EF 55-60%.  Mildly elevated BNP.  Imaging personally reviewed noting mild congestion.  Will give trial of IV Lasix  as above.  Monitor O2.  Monitor urine output and recheck BMP in AM.  Hypertension - Zoom home medication regimen.  Paroxysmal atrial fibrillation - Continue Eliquis , metoprolol .  Physical debilitation muscle weakness - Likely exacerbated by above.  Will order PT consult.  Subjective: Patient resting comfortably in bed this morning.  Still complaining of ongoing dyspnea and  shortness of breath but slightly improved from yesterday.  Denies any purulent sputum, fever, chest pain, nausea, vomiting, abdominal pain.  States she still not quite at her baseline.  Physical Exam:  Vitals:   10/22/23 0044 10/22/23 0403 10/22/23 0725 10/22/23 0820  BP: (!) 148/60 (!) 147/54 (!) 152/59   Pulse: (!) 57 60 60   Resp: 18 18 17    Temp: 98.1 F (36.7 C) 97.8 F (36.6 C) 97.8 F (36.6 C)   TempSrc: Oral Oral Oral   SpO2: 90% 91% 90% 90%  Weight:  95.5 kg    Height:        GENERAL:  Alert, pleasant, no acute distress  HEENT:  EOMI nasal cannula CARDIOVASCULAR:  RRR, no murmurs appreciated RESPIRATORY: Mildly dyspneic, poor air movement, BL crackles GASTROINTESTINAL:  Soft, nontender, nondistended EXTREMITIES:  No LE edema bilaterally NEURO:  No new focal deficits appreciated SKIN:  No rashes noted PSYCH:  Appropriate mood and affect     Data Reviewed:  Imaging Studies: CT Angio Chest PE W and/or Wo Contrast Result Date: 10/21/2023 CLINICAL DATA:  Pulmonary embolism (PE) suspected, high prob EXAM: CT ANGIOGRAPHY CHEST WITH CONTRAST TECHNIQUE: Multidetector CT imaging of the chest was performed using the standard protocol during bolus administration of intravenous contrast. Multiplanar CT image reconstructions and MIPs were obtained to evaluate the vascular anatomy. RADIATION DOSE REDUCTION: This exam was performed according to the departmental dose-optimization program which includes automated exposure control, adjustment of the mA and/or kV according to patient size and/or use of iterative reconstruction technique. CONTRAST:  60mL OMNIPAQUE  IOHEXOL  350 MG/ML SOLN COMPARISON:  December 14, 2021 FINDINGS: Pulmonary Embolism: No pulmonary embolism. Cardiovascular: Moderate cardiomegaly severe multi-vessel coronary atherosclerosis with  postsurgical changes of a prior CABG.Left chest pacemaker with leads terminating in the right atrium and right ventricle.No aortic aneurysm.  Diffuse aortic atherosclerosis. Mediastinum/Nodes: No mediastinal mass. Mildly enlarged multistation mediastinal lymph nodes, measuring up to 1.5 cm, likely reactive. Lungs/Pleura: The midline trachea and bronchi are patent. Similar appearance of the mosaic attenuation with an upper lobe predominance throughout the lungs. Increasing ground-glass attenuation within the right upper lobe, right middle lobe, and basilar segments of the right lower lobe. Increasing ground-glass attenuation also noted in the lingula and left lower lobe. No pneumothorax. No pleural effusion. Musculoskeletal: No acute fracture or destructive bone lesion. Sternotomy wires. Multilevel degenerative disc disease of the spine. Upper Abdomen: No acute abnormality in the partially visualized upper abdomen. Reflux of contrast into the hepatic veins, consistent with underlying cardiac dysfunction. Review of the MIP images confirms the above findings. IMPRESSION: 1. 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 Alternatively, drug-induced lung disease could be a possibility. Increasing ground-glass attenuation within the lungs, as described above, possibly artifactual from lower lung volumes versus multifocal pneumonia or pulmonary edema. 2. Cardiomegaly with findings of underlying cardiac dysfunction. 3. Unchanged, mildly enlarged multistation mediastinal lymph nodes, likely reactive. Aortic Atherosclerosis (ICD10-I70.0). Electronically Signed   By: Rogelia Myers M.D.   On: 10/21/2023 18:08    As above  Previous records (including but not limited to H&P, progress notes, nursing notes, TOC management) were reviewed in assessment of this patient.  Labs: CBC: Recent Labs  Lab 10/21/23 1514 10/22/23 0229  WBC 11.1* 9.9  NEUTROABS  --  9.1*  HGB 12.9 11.6*  HCT 41.4 37.1  MCV 91.2 89.6  PLT 335 289   Basic Metabolic Panel: Recent Labs  Lab 10/21/23 1514  10/21/23 2106 10/22/23 0229  NA 137  --  137  K 3.5  --  3.7  CL 98  --  97*  CO2 25  --  28  GLUCOSE 138*  --  210*  BUN 19  --  22  CREATININE 1.06*  --  1.16*  CALCIUM  9.3  --  8.9  MG  --  1.8 2.0  PHOS  --   --  4.8*   Liver Function Tests: Recent Labs  Lab 10/22/23 0229  AST 23  ALT 14  ALKPHOS 63  BILITOT 0.8  PROT 7.2  ALBUMIN 2.9*   CBG: Recent Labs  Lab 10/21/23 2140 10/22/23 0615  GLUCAP 213* 203*    Scheduled Meds:  apixaban   5 mg Oral BID   furosemide   40 mg Oral BID   insulin  aspart  0-5 Units Subcutaneous QHS   insulin  aspart  0-9 Units Subcutaneous TID WC   insulin  glargine-yfgn  12 Units Subcutaneous BID   ipratropium-albuterol   3 mL Nebulization BID   methylPREDNISolone  (SOLU-MEDROL ) injection  80 mg Intravenous Q12H   metoprolol  succinate  50 mg Oral Daily   ondansetron  (ZOFRAN ) IV  4 mg Intravenous Once   Continuous Infusions:  azithromycin      cefTRIAXone  (ROCEPHIN )  IV     PRN Meds:.acetaminophen  **OR** acetaminophen , albuterol , melatonin, ondansetron  (ZOFRAN ) IV  Family Communication: Family at bedside  Disposition: Status is: Observation The patient will require care spanning > 2 midnights and should be moved to inpatient because: Interstitial lung disease exacerbation     Time spent: 37 minutes  Length of inpatient stay: 0 days  Author: Carliss LELON Canales, DO 10/22/2023 10:27 AM  For on call review [email or web address].

## 2023-10-22 NOTE — TOC CM/SW Note (Signed)
 Transition of Care [facility]) - Inpatient Brief Assessment   Patient Details  Name: [name] MRN: [id number] Date of Birth: Jun 14, 1945  [facility] CM/SW Contact:    Waddell Barnie Rama, RN Phone Number: 10/22/2023, 5:02 PM   Clinical Narrative: From home with spouse, has PCP and insurance on file, states has no HH services in place at this time , has shower chair, walker, home oxygen  2 liters- [employer], crutches  at home.  States family member will transport them home at [employer] and family is support system, states gets medications from [employer].  Pta self ambulatory with walker.   Per PT eval rec HHPT, NCM offered choice, she state to call her daughter who is the POA, daughter did not have a preference.  NCM made referral to [facility] with [facility], she is able to take referral.  Soc will begin 24 to 48 hrs post dc.     Transition of Care Asessment: Insurance and Status: Insurance coverage has been reviewed Patient has primary care physician: Yes Home environment has been reviewed: home with spouse and adult daughter Prior level of function:: ambulatory with walker Prior/Current Home Services: Current home services (shower chair, walker, home oxygen  2 liters, crutches) Social Drivers of Health Review: SDOH reviewed no interventions necessary Readmission risk has been reviewed: Yes Transition of care needs: transition of care needs identified, TOC will continue to follow

## 2023-10-22 NOTE — Care Management Obs Status (Signed)
 MEDICARE OBSERVATION STATUS NOTIFICATION   Patient Details  Name: [name] MRN: [id number] Date of Birth: 08/27/1945   Medicare Observation Status Notification Given:  Yes    Vonzell Arrie Sharps 10/22/2023, 8:39 AM

## 2023-10-22 NOTE — Progress Notes (Signed)
 Echocardiogram 2D Echocardiogram has been performed.  Damien FALCON Aryelle Figg RDCS 10/22/2023, 1:39 PM

## 2023-10-23 DIAGNOSIS — J84112 Idiopathic pulmonary fibrosis: Secondary | ICD-10-CM | POA: Diagnosis not present

## 2023-10-23 DIAGNOSIS — J9611 Chronic respiratory failure with hypoxia: Secondary | ICD-10-CM | POA: Diagnosis not present

## 2023-10-23 DIAGNOSIS — I5032 Chronic diastolic (congestive) heart failure: Secondary | ICD-10-CM | POA: Diagnosis not present

## 2023-10-23 DIAGNOSIS — J849 Interstitial pulmonary disease, unspecified: Secondary | ICD-10-CM | POA: Diagnosis not present

## 2023-10-23 LAB — GLUCOSE, CAPILLARY
Glucose-Capillary: 260 mg/dL — ABNORMAL HIGH (ref 70–99)
Glucose-Capillary: 348 mg/dL — ABNORMAL HIGH (ref 70–99)
Glucose-Capillary: 265 mg/dL — ABNORMAL HIGH (ref 70–99)
Glucose-Capillary: 256 mg/dL — ABNORMAL HIGH (ref 70–99)

## 2023-10-23 LAB — CBC
HCT: 37.3 % (ref 36.0–46.0)
Hemoglobin: 11.6 g/dL — ABNORMAL LOW (ref 12.0–15.0)
MCH: 28.2 pg (ref 26.0–34.0)
MCHC: 31.1 g/dL (ref 30.0–36.0)
MCV: 90.8 fL (ref 80.0–100.0)
Platelets: 345 K/uL (ref 150–400)
RBC: 4.11 MIL/uL (ref 3.87–5.11)
RDW: 15.3 % (ref 11.5–15.5)
WBC: 15.5 K/uL — ABNORMAL HIGH (ref 4.0–10.5)
nRBC: 0 % (ref 0.0–0.2)

## 2023-10-23 LAB — MAGNESIUM: Magnesium: 2.1 mg/dL (ref 1.7–2.4)

## 2023-10-23 LAB — BASIC METABOLIC PANEL WITH GFR
Anion gap: 11 (ref 5–15)
BUN: 37 mg/dL — ABNORMAL HIGH (ref 8–23)
CO2: 30 mmol/L (ref 22–32)
Calcium: 9.5 mg/dL (ref 8.9–10.3)
Chloride: 97 mmol/L — ABNORMAL LOW (ref 98–111)
Creatinine, Ser: 1.48 mg/dL — ABNORMAL HIGH (ref 0.44–1.00)
GFR, Estimated: 36 mL/min — ABNORMAL LOW (ref >60)
Glucose, Bld: 289 mg/dL — ABNORMAL HIGH (ref 70–99)
Potassium: 3.6 mmol/L (ref 3.5–5.1)
Sodium: 138 mmol/L (ref 135–145)

## 2023-10-23 LAB — HEMOGLOBIN A1C
Hgb A1c MFr Bld: 7.4 % — ABNORMAL HIGH (ref 4.8–5.6)
Mean Plasma Glucose: 166 mg/dL

## 2023-10-23 MED ORDER — METHYLPREDNISOLONE SODIUM SUCC 40 MG IJ SOLR
40.0000 mg | Freq: Every day | INTRAMUSCULAR | Status: DC
  Administered 2023-10-24: 40 mg via INTRAVENOUS
  Filled 2023-10-23: qty 1

## 2023-10-23 MED ORDER — GUAIFENESIN-DM 100-10 MG/5ML PO SYRP
5.0000 mL | ORAL_SOLUTION | ORAL | Status: DC | PRN
  Administered 2023-10-23: 5 mL via ORAL
  Filled 2023-10-23: qty 5

## 2023-10-23 NOTE — Plan of Care (Signed)
  Problem: Education: Goal: Knowledge of General Education information will improve Description: Including pain rating scale, medication(s)/side effects and non-pharmacologic comfort measures Outcome: Progressing   Problem: Clinical Measurements: Goal: Ability to maintain clinical measurements within normal limits will improve Outcome: Progressing   Problem: Clinical Measurements: Goal: Will remain free from infection Outcome: Progressing   Problem: Clinical Measurements: Goal: Will remain free from infection Outcome: Progressing   Problem: Clinical Measurements: Goal: Diagnostic test results will improve Outcome: Progressing   Problem: Nutrition: Goal: Adequate nutrition will be maintained Outcome: Progressing   Problem: Activity: Goal: Risk for activity intolerance will decrease Outcome: Progressing   Problem: Coping: Goal: Level of anxiety will decrease Outcome: Progressing   Problem: Education: Goal: Individualized Educational Video(s) Outcome: Progressing

## 2023-10-23 NOTE — Inpatient Diabetes Management (Signed)
 Inpatient Diabetes Program Recommendations  AACE/ADA: New Consensus Statement on Inpatient Glycemic Control (2015)  Target Ranges:  Prepandial:   less than 140 mg/dL      Peak postprandial:   less than 180 mg/dL (1-2 hours)      Critically ill patients:  140 - 180 mg/dL   Lab Results  Component Value Date   GLUCAP 348 (H) 10/23/2023   HGBA1C 7.4 (H) 10/21/2023    Latest Reference Range & Units 10/22/23 11:54 10/22/23 15:49 10/22/23 20:02 10/23/23 06:22 10/23/23 11:19  Glucose-Capillary 70 - 99 mg/dL 718 (H) 695 (H) 681 (H) 260 (H) 348 (H)  (H): Data is abnormally high Review of Glycemic Control  Diabetes history: DM2 Outpatient Diabetes medications: 75/25 40 units TID, Farxiga 10 mg daily Current orders for Inpatient glycemic control: Semglee  12 units BID, Novolog  0-9 units correction scale TID, Novolog  0-5 units HS scale  Inpatient Diabetes Program Recommendations:   Noted that blood sugars have been greater than 200 mg/dl.  Recommend Semglee  20 units BID, Novolog  0-20 units correction scale TID, Novolog  0-5 units HS scale especially while on steroids and if blood sugars continue to be elevated.  Titrate dosages as needed. Getting nutritional supplements. May want to change diet to CHO modified moderate diet instead of regular diet.   Marjorie Lunger RN BSN CDE Diabetes Coordinator Pager: [phone]  8am-5pm

## 2023-10-23 NOTE — Progress Notes (Signed)
 Progress Note   Patient: [name] FMW:[id number] DOB: 18-May-1945 DOA: 10/21/2023  DOS: the patient was seen and examined on 10/23/2023   Brief hospital course:  78 y.o. female with medical history significant for chronic hypoxic respiratory failure on 2 L continuous nasal cannula, interstitial lung disease, chronic diastolic heart failure, coronary artery disease status post CABG in 2005, paroxysmal atrial fibrillation complicated with sick sinus syndrome status post pacemaker placement in March 2016, type 2 diabetes mellitus, who is admitted to [facility] on 10/21/2023 with suspected acute exacerbation of interstitial lung disease after presenting from home to [facility] ED complaining of shortness of breath.    Assessment and Plan:   Acute exacerbation of chronic interstitial lung disease - Worsening shortness of breath on exertion.  CT noting underlying ILD with increase in groundglass opacities.  Possible underlying congestion given worsening LE edema.  Decrease IV Solu-Medrol  40 mg to every 24 hours.  Scheduled nebulizers.  Continue Lasix  40 mg IV daily (discontinue p.o.).  Monitor O2 closely.  Check CBC in AM.  Showing improvement.   Chronic hypoxic respiratory failure - Patient currently on 2 L nasal cannula which is her baseline.  Possibility that patient may need increased O2 at baseline.  Follow closely with PCP/pulmonology in the outpatient setting.   Acute exacerbation of chronic HFpEF - Previous EF 55-60%.  Mildly elevated BNP.  Imaging personally reviewed noting mild congestion.  Continue IV Lasix  as above.  Monitor O2.  Monitor urine output and recheck BMP in AM.   Hypertension - Resume home medication regimen.   Paroxysmal atrial fibrillation - Continue Eliquis , metoprolol .   Physical debilitation muscle weakness - Likely exacerbated by above.  Evaluated by physical therapy.  Recommending home health PT.  Anticipate discharge tomorrow.   Subjective: Patient sitting  up in bed, states she feels improved from yesterday.  Did admit to having increased dry cough that kept her awake, improved with cough medications.  Denies any fever, worsening shortness of breath, chest pain, nausea, vomiting, abdominal pain.  Anticipates being discharged tomorrow.  Physical Exam:  Vitals:   10/23/23 0436 10/23/23 0625 10/23/23 0811 10/23/23 0840  BP: (!) 158/69   (!) 152/60  Pulse: (!) 59   65  Resp: 20   18  Temp: 97.8 F (36.6 C)   97.8 F (36.6 C)  TempSrc: Oral   Oral  SpO2: 96%  97% 96%  Weight:  95.2 kg    Height:        GENERAL:  Alert, pleasant, no acute distress  HEENT:  EOMI, nasal cannula CARDIOVASCULAR:  RRR, no murmurs appreciated RESPIRATORY: Poor air movement, mild crackles GASTROINTESTINAL:  Soft, nontender, nondistended EXTREMITIES:  No LE edema bilaterally NEURO:  No new focal deficits appreciated SKIN:  No rashes noted PSYCH:  Appropriate mood and affect    Data Reviewed:  Imaging Studies: ECHOCARDIOGRAM COMPLETE Result Date: 10/22/2023    ECHOCARDIOGRAM REPORT   Patient Name:   MERILYNN HAYDU Date of Exam: 10/22/2023 Medical Rec #:  [id number]        Height:       63.0 in Accession #:    [id number]       Weight:       210.5 lb Date of Birth:  May 04, 1945        BSA:          1.976 m Patient Age:    78 years         BP:  142/59 mmHg Patient Gender: F                HR:           64 bpm. Exam Location:  Inpatient Procedure: 2D Echo, Cardiac Doppler, Color Doppler and Intracardiac            Opacification Agent (Both Spectral and Color Flow Doppler were            utilized during procedure). Indications:    I50.31 Acute diastolic (congestive) heart failure  History:        Patient has prior history of Echocardiogram examinations, most                 recent 02/26/2022. Pacemaker and Prior CABG, Arrythmias:Atrial                 Fibrillation; Risk Factors:Hypertension, Diabetes, Dyslipidemia                 and Sleep Apnea.  Sonographer:     Damien Senior RDCS Referring Phys: [id number] EVA KATHEE PORE  Sonographer Comments: Technically difficult due to lung interference (active pneumonia) IMPRESSIONS  1. Left ventricular ejection fraction, by estimation, is 60 to 65%. The left ventricle has normal function. The left ventricle has no regional wall motion abnormalities. There is moderate left ventricular hypertrophy of the basal-septal segment. Left ventricular diastolic function could not be evaluated.  2. Right ventricular systolic function is normal. The right ventricular size is severely enlarged. There is severely elevated pulmonary artery systolic pressure. The estimated right ventricular systolic pressure is 71.6 mmHg.  3. Left atrial size was severely dilated.  4. Right atrial size was severely dilated.  5. The mitral valve is normal in structure. Mild mitral valve regurgitation. Moderate mitral stenosis. The mean mitral valve gradient is 7.5 mmHg. Moderate mitral annular calcification.  6. The aortic valve is tricuspid. Aortic valve regurgitation is mild. Aortic valve sclerosis/calcification is present, without any evidence of aortic stenosis. Aortic valve area, by VTI measures 2.84 cm. Aortic valve mean gradient measures 10.0 mmHg. Aortic valve Vmax measures 2.16 m/s.  7. The inferior vena cava is dilated in size with <50% respiratory variability, suggesting right atrial pressure of 15 mmHg. FINDINGS  Left Ventricle: Left ventricular ejection fraction, by estimation, is 60 to 65%. The left ventricle has normal function. The left ventricle has no regional wall motion abnormalities. Definity  contrast agent was given IV to delineate the left ventricular  endocardial borders. The left ventricular internal cavity size was normal in size. There is moderate left ventricular hypertrophy of the basal-septal segment. Left ventricular diastolic function could not be evaluated. Right Ventricle: The right ventricular size is severely enlarged. No increase in  right ventricular wall thickness. Right ventricular systolic function is normal. There is severely elevated pulmonary artery systolic pressure. The tricuspid regurgitant velocity is 3.76 m/s, and with an assumed right atrial pressure of 15 mmHg, the estimated right ventricular systolic pressure is 71.6 mmHg. Left Atrium: Left atrial size was severely dilated. Right Atrium: Right atrial size was severely dilated. Pericardium: There is no evidence of pericardial effusion. Mitral Valve: The mitral valve is normal in structure. Moderate mitral annular calcification. Mild mitral valve regurgitation. Moderate mitral valve stenosis. MV peak gradient, 23.3 mmHg. The mean mitral valve gradient is 7.5 mmHg. Tricuspid Valve: The tricuspid valve is normal in structure. Tricuspid valve regurgitation is mild . No evidence of tricuspid stenosis. Aortic Valve: The aortic valve is tricuspid. Aortic valve regurgitation is  mild. Aortic valve sclerosis/calcification is present, without any evidence of aortic stenosis. Aortic valve mean gradient measures 10.0 mmHg. Aortic valve peak gradient measures 18.6 mmHg. Aortic valve area, by VTI measures 2.84 cm. Pulmonic Valve: The pulmonic valve was normal in structure. Pulmonic valve regurgitation is mild. No evidence of pulmonic stenosis. Aorta: The aortic root is normal in size and structure. Venous: The inferior vena cava is dilated in size with less than 50% respiratory variability, suggesting right atrial pressure of 15 mmHg. IAS/Shunts: No atrial level shunt detected by color flow Doppler. Additional Comments: A device lead is visualized.  LEFT VENTRICLE PLAX 2D LVIDd:         3.70 cm LVIDs:         2.50 cm LV PW:         1.10 cm LV IVS:        1.50 cm LVOT diam:     2.60 cm LV SV:         132 LV SV Index:   67 LVOT Area:     5.31 cm  LEFT ATRIUM              Index        RIGHT ATRIUM           Index LA diam:        4.40 cm  2.23 cm/m   RA Area:     30.50 cm LA Vol (A2C):   105.0 ml  53.13 ml/m  RA Volume:   102.00 ml 51.61 ml/m LA Vol (A4C):   128.0 ml 64.77 ml/m LA Biplane Vol: 117.0 ml 59.20 ml/m  AORTIC VALVE AV Area (Vmax):    2.56 cm AV Area (Vmean):   2.73 cm AV Area (VTI):     2.84 cm AV Vmax:           215.67 cm/s AV Vmean:          146.667 cm/s AV VTI:            0.464 m AV Peak Grad:      18.6 mmHg AV Mean Grad:      10.0 mmHg LVOT Vmax:         104.00 cm/s LVOT Vmean:        75.400 cm/s LVOT VTI:          0.248 m LVOT/AV VTI ratio: 0.53  AORTA Ao Root diam: 3.10 cm Ao Asc diam:  3.70 cm MITRAL VALVE               TRICUSPID VALVE MV Area (PHT): 1.62 cm    TR Peak grad:   56.6 mmHg MV Area VTI:   1.81 cm    TR Vmax:        376.00 cm/s MV Peak grad:  23.3 mmHg MV Mean grad:  7.5 mmHg    SHUNTS MV Vmax:       2.42 m/s    Systemic VTI:  0.25 m MV Vmean:      116.5 cm/s  Systemic Diam: 2.60 cm Wilbert Bihari MD Electronically signed by Wilbert Bihari MD Signature Date/Time: 10/22/2023/1:48:06 PM    Final    CT Angio Chest PE W and/or Wo Contrast Result Date: 10/21/2023 CLINICAL DATA:  Pulmonary embolism (PE) suspected, high prob EXAM: CT ANGIOGRAPHY CHEST WITH CONTRAST TECHNIQUE: Multidetector CT imaging of the chest was performed using the standard protocol during bolus administration of intravenous contrast. Multiplanar CT image reconstructions and MIPs were obtained to evaluate  the vascular anatomy. RADIATION DOSE REDUCTION: This exam was performed according to the departmental dose-optimization program which includes automated exposure control, adjustment of the mA and/or kV according to patient size and/or use of iterative reconstruction technique. CONTRAST:  60mL OMNIPAQUE  IOHEXOL  350 MG/ML SOLN COMPARISON:  December 14, 2021 FINDINGS: Pulmonary Embolism: No pulmonary embolism. Cardiovascular: Moderate cardiomegaly severe multi-vessel coronary atherosclerosis with postsurgical changes of a prior CABG.Left chest pacemaker with leads terminating in the right atrium and right  ventricle.No aortic aneurysm. Diffuse aortic atherosclerosis. Mediastinum/Nodes: No mediastinal mass. Mildly enlarged multistation mediastinal lymph nodes, measuring up to 1.5 cm, likely reactive. Lungs/Pleura: The midline trachea and bronchi are patent. Similar appearance of the mosaic attenuation with an upper lobe predominance throughout the lungs. Increasing ground-glass attenuation within the right upper lobe, right middle lobe, and basilar segments of the right lower lobe. Increasing ground-glass attenuation also noted in the lingula and left lower lobe. No pneumothorax. No pleural effusion. Musculoskeletal: No acute fracture or destructive bone lesion. Sternotomy wires. Multilevel degenerative disc disease of the spine. Upper Abdomen: No acute abnormality in the partially visualized upper abdomen. Reflux of contrast into the hepatic veins, consistent with underlying cardiac dysfunction. Review of the MIP images confirms the above findings. IMPRESSION: 1. 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 Alternatively, drug-induced lung disease could be a possibility. Increasing ground-glass attenuation within the lungs, as described above, possibly artifactual from lower lung volumes versus multifocal pneumonia or pulmonary edema. 2. Cardiomegaly with findings of underlying cardiac dysfunction. 3. Unchanged, mildly enlarged multistation mediastinal lymph nodes, likely reactive. Aortic Atherosclerosis (ICD10-I70.0). Electronically Signed   By: Rogelia Myers M.D.   On: 10/21/2023 18:08    There are no new results to review at this time.  Previous records (including but not limited to H&P, progress notes, nursing notes, TOC management) were reviewed in assessment of this patient.  Labs: CBC: Recent Labs  Lab 10/21/23 1514 10/22/23 0229 10/23/23 0230  WBC 11.1* 9.9 15.5*  NEUTROABS  --  9.1*  --   HGB 12.9 11.6* 11.6*  HCT 41.4 37.1  37.3  MCV 91.2 89.6 90.8  PLT 335 289 345   Basic Metabolic Panel: Recent Labs  Lab 10/21/23 1514 10/21/23 2106 10/22/23 0229 10/23/23 0230  NA 137  --  137 138  K 3.5  --  3.7 3.6  CL 98  --  97* 97*  CO2 25  --  28 30  GLUCOSE 138*  --  210* 289*  BUN 19  --  22 37*  CREATININE 1.06*  --  1.16* 1.48*  CALCIUM  9.3  --  8.9 9.5  MG  --  1.8 2.0 2.1  PHOS  --   --  4.8*  --    Liver Function Tests: Recent Labs  Lab 10/22/23 0229  AST 23  ALT 14  ALKPHOS 63  BILITOT 0.8  PROT 7.2  ALBUMIN 2.9*   CBG: Recent Labs  Lab 10/22/23 1154 10/22/23 1549 10/22/23 2002 10/23/23 0622 10/23/23 1119  GLUCAP 281* 304* 318* 260* 348*    Scheduled Meds:  apixaban   5 mg Oral BID   azithromycin   500 mg Oral Daily   insulin  aspart  0-5 Units Subcutaneous QHS   insulin  aspart  0-9 Units Subcutaneous TID WC   insulin  glargine-yfgn  12 Units Subcutaneous BID   ipratropium-albuterol   3 mL Nebulization BID   methylPREDNISolone  (SOLU-MEDROL ) injection  40 mg Intravenous Q12H   metoprolol  succinate  50 mg  Oral Daily   ondansetron  (ZOFRAN ) IV  4 mg Intravenous Once   Continuous Infusions:  cefTRIAXone  (ROCEPHIN )  IV 1 g (10/22/23 2035)   PRN Meds:.acetaminophen  **OR** acetaminophen , albuterol , guaiFENesin -dextromethorphan , melatonin, ondansetron  (ZOFRAN ) IV  Family Communication: Family at bedside  Disposition: Status is: Inpatient Remains inpatient appropriate because: ILD exacerbation     Time spent: 35 minutes  Length of inpatient stay: 1 days  Author: Carliss LELON Canales, DO 10/23/2023 11:28 AM  For on call review [email or web address].

## 2023-10-24 ENCOUNTER — Other Ambulatory Visit (HOSPITAL_COMMUNITY): Payer: Self-pay

## 2023-10-24 DIAGNOSIS — I5032 Chronic diastolic (congestive) heart failure: Secondary | ICD-10-CM | POA: Diagnosis not present

## 2023-10-24 DIAGNOSIS — E119 Type 2 diabetes mellitus without complications: Secondary | ICD-10-CM | POA: Diagnosis not present

## 2023-10-24 DIAGNOSIS — J84112 Idiopathic pulmonary fibrosis: Secondary | ICD-10-CM | POA: Diagnosis not present

## 2023-10-24 DIAGNOSIS — J9611 Chronic respiratory failure with hypoxia: Secondary | ICD-10-CM | POA: Diagnosis not present

## 2023-10-24 LAB — BASIC METABOLIC PANEL WITH GFR
Anion gap: 10 (ref 5–15)
BUN: 48 mg/dL — ABNORMAL HIGH (ref 8–23)
CO2: 31 mmol/L (ref 22–32)
Calcium: 9.4 mg/dL (ref 8.9–10.3)
Chloride: 97 mmol/L — ABNORMAL LOW (ref 98–111)
Creatinine, Ser: 1.47 mg/dL — ABNORMAL HIGH (ref 0.44–1.00)
GFR, Estimated: 37 mL/min — ABNORMAL LOW (ref >60)
Glucose, Bld: 234 mg/dL — ABNORMAL HIGH (ref 70–99)
Potassium: 4 mmol/L (ref 3.5–5.1)
Sodium: 138 mmol/L (ref 135–145)

## 2023-10-24 LAB — CBC
HCT: 38.1 % (ref 36.0–46.0)
Hemoglobin: 11.6 g/dL — ABNORMAL LOW (ref 12.0–15.0)
MCH: 28.1 pg (ref 26.0–34.0)
MCHC: 30.4 g/dL (ref 30.0–36.0)
MCV: 92.3 fL (ref 80.0–100.0)
Platelets: 346 K/uL (ref 150–400)
RBC: 4.13 MIL/uL (ref 3.87–5.11)
RDW: 15.5 % (ref 11.5–15.5)
WBC: 15.9 K/uL — ABNORMAL HIGH (ref 4.0–10.5)
nRBC: 0 % (ref 0.0–0.2)

## 2023-10-24 LAB — GLUCOSE, CAPILLARY
Glucose-Capillary: 184 mg/dL — ABNORMAL HIGH (ref 70–99)
Glucose-Capillary: 190 mg/dL — ABNORMAL HIGH (ref 70–99)

## 2023-10-24 MED ORDER — IPRATROPIUM-ALBUTEROL 0.5-2.5 (3) MG/3ML IN SOLN
3.0000 mL | Freq: Four times a day (QID) | RESPIRATORY_TRACT | Status: DC
  Administered 2023-10-24: 3 mL via RESPIRATORY_TRACT
  Filled 2023-10-24: qty 3

## 2023-10-24 MED ORDER — PREDNISONE 10 MG PO TABS
ORAL_TABLET | ORAL | 0 refills | Status: DC
  Filled 2023-10-24: qty 30, 12d supply, fill #0

## 2023-10-24 MED ORDER — BENZONATATE 100 MG PO CAPS
100.0000 mg | ORAL_CAPSULE | Freq: Two times a day (BID) | ORAL | Status: DC
  Administered 2023-10-24: 100 mg via ORAL
  Filled 2023-10-24: qty 1

## 2023-10-24 MED ORDER — IPRATROPIUM-ALBUTEROL 0.5-2.5 (3) MG/3ML IN SOLN
3.0000 mL | Freq: Four times a day (QID) | RESPIRATORY_TRACT | 0 refills | Status: AC
  Filled 2023-10-24: qty 360, 30d supply, fill #0

## 2023-10-24 MED ORDER — AMOXICILLIN-POT CLAVULANATE 875-125 MG PO TABS
1.0000 | ORAL_TABLET | Freq: Two times a day (BID) | ORAL | 0 refills | Status: DC
  Filled 2023-10-24: qty 6, 3d supply, fill #0

## 2023-10-24 MED ORDER — FUROSEMIDE 10 MG/ML IJ SOLN
40.0000 mg | Freq: Once | INTRAMUSCULAR | Status: AC
  Administered 2023-10-24: 40 mg via INTRAVENOUS
  Filled 2023-10-24: qty 4

## 2023-10-24 MED ORDER — BENZONATATE 100 MG PO CAPS
100.0000 mg | ORAL_CAPSULE | Freq: Two times a day (BID) | ORAL | 0 refills | Status: AC
  Filled 2023-10-24: qty 20, 10d supply, fill #0

## 2023-10-24 MED ORDER — AZITHROMYCIN 500 MG PO TABS
500.0000 mg | ORAL_TABLET | Freq: Every day | ORAL | 0 refills | Status: DC
  Filled 2023-10-24: qty 3, 3d supply, fill #0

## 2023-10-24 MED ORDER — GUAIFENESIN-DM 100-10 MG/5ML PO SYRP
5.0000 mL | ORAL_SOLUTION | ORAL | 0 refills | Status: AC | PRN
  Filled 2023-10-24: qty 118, 4d supply, fill #0

## 2023-10-24 NOTE — Plan of Care (Signed)

## 2023-10-24 NOTE — Discharge Summary (Signed)
 Physician Discharge Summary   Patient: [name] MRN: [id number] DOB: Feb 25, 1946  Admit date:     10/21/2023  Discharge date: 10/24/23  Discharge Physician: Carliss LELON Canales   PCP: Larnell Hamilton, MD   Recommendations at discharge:    Pt to be discharged home with home health.   If you experience worsening fever, chills, chest pain, shortness of breath, or other concerning symptoms, please call your PCP or go to the emergency department immediately.  Discharge Diagnoses: Principal Problem:   Acute exacerbation of idiopathic pulmonary fibrosis (HCC) Active Problems:   DM2 (diabetes mellitus, type 2) (HCC)   Paroxysmal atrial fibrillation (HCC)   ILD (interstitial lung disease) (HCC)   Shortness of breath   Leukocytosis   Chronic hypoxic respiratory failure (HCC)   Chronic diastolic CHF (congestive heart failure) (HCC)   History of essential hypertension  Resolved Problems:   * No resolved hospital problems. *   Hospital Course:  78 y.o. female with medical history significant for chronic hypoxic respiratory failure on 2 L continuous nasal cannula, interstitial lung disease, chronic diastolic heart failure, coronary artery disease status post CABG in 2005, paroxysmal atrial fibrillation complicated with sick sinus syndrome status post pacemaker placement in March 2016, type 2 diabetes mellitus, who is admitted to [facility] on 10/21/2023 with suspected acute exacerbation of interstitial lung disease after presenting from home to [facility] ED complaining of shortness of breath.    Assessment and Plan:   Acute exacerbation of chronic interstitial lung disease - Worsening shortness of breath on exertion.  CT noting underlying ILD with increase in groundglass opacities.  Possible underlying congestion given worsening LE edema.  Responded well to IV Solu-Medrol , empiric antibiotics, nebulizers, supplemental O2.  Will transition patient p.o. Augmentin  plus azithromycin  to take for  the next 3 days as directed.  Will also give prescription for prednisone  taper to take as directed.  Recommend close follow-up with pulmonology in the outpatient setting.  Chronic hypoxic respiratory failure - Patient currently on 2-3 L nasal cannula which is her baseline.  Possibility that patient may need increased O2 at baseline.  Follow closely with PCP/pulmonology in the outpatient setting.   Acute exacerbation of chronic HFpEF - Previous EF 55-60%.  Mildly elevated BNP.  Imaging personally reviewed noting mild congestion.  Spotted well to IV Lasix  over the course hospital stay.  Patient can resume her normal p.o. Lasix  regimen upon discharge.   Hypertension - Resume home medication regimen.   Paroxysmal atrial fibrillation - Continue Eliquis , metoprolol .   Physical debilitation muscle weakness - Likely exacerbated by above.  Evaluated by physical therapy.  Recommending home health PT.    Consultants: None Procedures performed: None Disposition: Home health Diet recommendation:  Discharge Diet Orders (From admission, onward)     Start     Ordered   10/24/23 0000  Diet - low sodium heart healthy        10/24/23 1017           Cardiac and Carb modified diet  DISCHARGE MEDICATION: Allergies as of 10/24/2023       Reactions   Amiodarone      pulmonary fibrosis   Gabapentin  Swelling   edema   Torsemide  Nausea Only   Headache        Medication List     TAKE these medications    ALLERGY RELIEF PO Take 2 tablets by mouth as needed.   amoxicillin -clavulanate 875-125 MG tablet Commonly known as: AUGMENTIN  Take 1 tablet by  mouth 2 (two) times daily for 3 days.   azithromycin  500 MG tablet Commonly known as: Zithromax  Take 1 tablet (500 mg total) by mouth daily for 3 days.   B-COMPLEX PO Take 1 capsule by mouth every morning.   benzonatate  100 MG capsule Commonly known as: TESSALON  Take 1 capsule (100 mg total) by mouth 2 (two) times daily.   Biotin 1000  MCG tablet Take 1,000 mcg by mouth daily.   Co Q-10 200 MG Caps Take 200 mg by mouth daily.   Eliquis  5 MG Tabs tablet Generic drug: apixaban  TAKE ONE TABLET TWICE DAILY   Farxiga 10 MG Tabs tablet Generic drug: dapagliflozin propanediol Take 10 mg by mouth daily.   furosemide  40 MG tablet Commonly known as: LASIX  Take 1 tablet (40 mg total) by mouth 2 (two) times daily.   guaiFENesin -dextromethorphan  100-10 MG/5ML syrup Commonly known as: ROBITUSSIN DM Take 5 mLs by mouth every 4 (four) hours as needed for cough.   HumaLOG  Mix 75/25 KwikPen (75-25) 100 UNIT/ML KwikPen Generic drug: Insulin  Lispro Prot & Lispro Inject 40 Units into the skin in the morning, at noon, and at bedtime.   ipratropium-albuterol  0.5-2.5 (3) MG/3ML Soln Commonly known as: DUONEB Take 3 mLs by nebulization 4 (four) times daily.   metolazone 2.5 MG tablet Commonly known as: ZAROXOLYN Take 2.5 mg by mouth daily as needed (weight gain > 5lbs).   metoprolol  succinate 50 MG 24 hr tablet Commonly known as: TOPROL -XL Take 1 tablet (50 mg total) by mouth daily.   OXYGEN  Inhale 2 L into the lungs continuous.   predniSONE  10 MG tablet Commonly known as: DELTASONE  Take 4 tablets (40 mg total) by mouth daily for 3 days, THEN 3 tablets (30 mg total) daily for 3 days, THEN 2 tablets (20 mg total) daily for 3 days, THEN 1 tablet (10 mg total) daily for 3 days. Start taking on: October 24, 2023   Repatha  SureClick 140 MG/ML Soaj Generic drug: Evolocumab  Inject 140 mg into the skin every 14 (fourteen) days.        Follow-up Information     [facility].   Specialty: Home Health Services Why: Agency will call you to set up apt times Contact information: [address] [place] [address] [phone]                 Discharge Exam: [name]   10/22/23 0403 10/23/23 0625 10/24/23 0459  Weight: 95.5 kg 95.2 kg 94.2 kg    GENERAL:  Alert, pleasant, no  acute distress  HEENT:  EOMI, nasal cannula CARDIOVASCULAR:  RRR, no murmurs appreciated RESPIRATORY: Poor air movement, mild crackles GASTROINTESTINAL:  Soft, nontender, nondistended EXTREMITIES:  No LE edema bilaterally NEURO:  No new focal deficits appreciated SKIN:  No rashes noted PSYCH:  Appropriate mood and affect    Condition at discharge: improving  The results of significant diagnostics from this hospitalization (including imaging, microbiology, ancillary and laboratory) are listed below for reference.   Imaging Studies: ECHOCARDIOGRAM COMPLETE Result Date: 10/22/2023    ECHOCARDIOGRAM REPORT   Patient Name:   GRACIANA SESSA Date of Exam: 10/22/2023 Medical Rec #:  [id number]        Height:       63.0 in Accession #:    [id number]       Weight:       210.5 lb Date of Birth:  14-Aug-1945        BSA:  1.976 m Patient Age:    78 years         BP:           142/59 mmHg Patient Gender: F                HR:           64 bpm. Exam Location:  Inpatient Procedure: 2D Echo, Cardiac Doppler, Color Doppler and Intracardiac            Opacification Agent (Both Spectral and Color Flow Doppler were            utilized during procedure). Indications:    I50.31 Acute diastolic (congestive) heart failure  History:        Patient has prior history of Echocardiogram examinations, most                 recent 02/26/2022. Pacemaker and Prior CABG, Arrythmias:Atrial                 Fibrillation; Risk Factors:Hypertension, Diabetes, Dyslipidemia                 and Sleep Apnea.  Sonographer:    Damien Senior RDCS Referring Phys: [id number] EVA KATHEE PORE  Sonographer Comments: Technically difficult due to lung interference (active pneumonia) IMPRESSIONS  1. Left ventricular ejection fraction, by estimation, is 60 to 65%. The left ventricle has normal function. The left ventricle has no regional wall motion abnormalities. There is moderate left ventricular hypertrophy of the basal-septal segment. Left  ventricular diastolic function could not be evaluated.  2. Right ventricular systolic function is normal. The right ventricular size is severely enlarged. There is severely elevated pulmonary artery systolic pressure. The estimated right ventricular systolic pressure is 71.6 mmHg.  3. Left atrial size was severely dilated.  4. Right atrial size was severely dilated.  5. The mitral valve is normal in structure. Mild mitral valve regurgitation. Moderate mitral stenosis. The mean mitral valve gradient is 7.5 mmHg. Moderate mitral annular calcification.  6. The aortic valve is tricuspid. Aortic valve regurgitation is mild. Aortic valve sclerosis/calcification is present, without any evidence of aortic stenosis. Aortic valve area, by VTI measures 2.84 cm. Aortic valve mean gradient measures 10.0 mmHg. Aortic valve Vmax measures 2.16 m/s.  7. The inferior vena cava is dilated in size with <50% respiratory variability, suggesting right atrial pressure of 15 mmHg. FINDINGS  Left Ventricle: Left ventricular ejection fraction, by estimation, is 60 to 65%. The left ventricle has normal function. The left ventricle has no regional wall motion abnormalities. Definity  contrast agent was given IV to delineate the left ventricular  endocardial borders. The left ventricular internal cavity size was normal in size. There is moderate left ventricular hypertrophy of the basal-septal segment. Left ventricular diastolic function could not be evaluated. Right Ventricle: The right ventricular size is severely enlarged. No increase in right ventricular wall thickness. Right ventricular systolic function is normal. There is severely elevated pulmonary artery systolic pressure. The tricuspid regurgitant velocity is 3.76 m/s, and with an assumed right atrial pressure of 15 mmHg, the estimated right ventricular systolic pressure is 71.6 mmHg. Left Atrium: Left atrial size was severely dilated. Right Atrium: Right atrial size was severely  dilated. Pericardium: There is no evidence of pericardial effusion. Mitral Valve: The mitral valve is normal in structure. Moderate mitral annular calcification. Mild mitral valve regurgitation. Moderate mitral valve stenosis. MV peak gradient, 23.3 mmHg. The mean mitral valve gradient is 7.5 mmHg. Tricuspid Valve: The  tricuspid valve is normal in structure. Tricuspid valve regurgitation is mild . No evidence of tricuspid stenosis. Aortic Valve: The aortic valve is tricuspid. Aortic valve regurgitation is mild. Aortic valve sclerosis/calcification is present, without any evidence of aortic stenosis. Aortic valve mean gradient measures 10.0 mmHg. Aortic valve peak gradient measures 18.6 mmHg. Aortic valve area, by VTI measures 2.84 cm. Pulmonic Valve: The pulmonic valve was normal in structure. Pulmonic valve regurgitation is mild. No evidence of pulmonic stenosis. Aorta: The aortic root is normal in size and structure. Venous: The inferior vena cava is dilated in size with less than 50% respiratory variability, suggesting right atrial pressure of 15 mmHg. IAS/Shunts: No atrial level shunt detected by color flow Doppler. Additional Comments: A device lead is visualized.  LEFT VENTRICLE PLAX 2D LVIDd:         3.70 cm LVIDs:         2.50 cm LV PW:         1.10 cm LV IVS:        1.50 cm LVOT diam:     2.60 cm LV SV:         132 LV SV Index:   67 LVOT Area:     5.31 cm  LEFT ATRIUM              Index        RIGHT ATRIUM           Index LA diam:        4.40 cm  2.23 cm/m   RA Area:     30.50 cm LA Vol (A2C):   105.0 ml 53.13 ml/m  RA Volume:   102.00 ml 51.61 ml/m LA Vol (A4C):   128.0 ml 64.77 ml/m LA Biplane Vol: 117.0 ml 59.20 ml/m  AORTIC VALVE AV Area (Vmax):    2.56 cm AV Area (Vmean):   2.73 cm AV Area (VTI):     2.84 cm AV Vmax:           215.67 cm/s AV Vmean:          146.667 cm/s AV VTI:            0.464 m AV Peak Grad:      18.6 mmHg AV Mean Grad:      10.0 mmHg LVOT Vmax:         104.00 cm/s LVOT  Vmean:        75.400 cm/s LVOT VTI:          0.248 m LVOT/AV VTI ratio: 0.53  AORTA Ao Root diam: 3.10 cm Ao Asc diam:  3.70 cm MITRAL VALVE               TRICUSPID VALVE MV Area (PHT): 1.62 cm    TR Peak grad:   56.6 mmHg MV Area VTI:   1.81 cm    TR Vmax:        376.00 cm/s MV Peak grad:  23.3 mmHg MV Mean grad:  7.5 mmHg    SHUNTS MV Vmax:       2.42 m/s    Systemic VTI:  0.25 m MV Vmean:      116.5 cm/s  Systemic Diam: 2.60 cm Wilbert Bihari MD Electronically signed by Wilbert Bihari MD Signature Date/Time: 10/22/2023/1:48:06 PM    Final    CT Angio Chest PE W and/or Wo Contrast Result Date: 10/21/2023 CLINICAL DATA:  Pulmonary embolism (PE) suspected, high prob EXAM: CT ANGIOGRAPHY CHEST WITH CONTRAST TECHNIQUE:  Multidetector CT imaging of the chest was performed using the standard protocol during bolus administration of intravenous contrast. Multiplanar CT image reconstructions and MIPs were obtained to evaluate the vascular anatomy. RADIATION DOSE REDUCTION: This exam was performed according to the departmental dose-optimization program which includes automated exposure control, adjustment of the mA and/or kV according to patient size and/or use of iterative reconstruction technique. CONTRAST:  60mL OMNIPAQUE  IOHEXOL  350 MG/ML SOLN COMPARISON:  December 14, 2021 FINDINGS: Pulmonary Embolism: No pulmonary embolism. Cardiovascular: Moderate cardiomegaly severe multi-vessel coronary atherosclerosis with postsurgical changes of a prior CABG.Left chest pacemaker with leads terminating in the right atrium and right ventricle.No aortic aneurysm. Diffuse aortic atherosclerosis. Mediastinum/Nodes: No mediastinal mass. Mildly enlarged multistation mediastinal lymph nodes, measuring up to 1.5 cm, likely reactive. Lungs/Pleura: The midline trachea and bronchi are patent. Similar appearance of the mosaic attenuation with an upper lobe predominance throughout the lungs. Increasing ground-glass attenuation within the right  upper lobe, right middle lobe, and basilar segments of the right lower lobe. Increasing ground-glass attenuation also noted in the lingula and left lower lobe. No pneumothorax. No pleural effusion. Musculoskeletal: No acute fracture or destructive bone lesion. Sternotomy wires. Multilevel degenerative disc disease of the spine. Upper Abdomen: No acute abnormality in the partially visualized upper abdomen. Reflux of contrast into the hepatic veins, consistent with underlying cardiac dysfunction. Review of the MIP images confirms the above findings. IMPRESSION: 1. 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 Alternatively, drug-induced lung disease could be a possibility. Increasing ground-glass attenuation within the lungs, as described above, possibly artifactual from lower lung volumes versus multifocal pneumonia or pulmonary edema. 2. Cardiomegaly with findings of underlying cardiac dysfunction. 3. Unchanged, mildly enlarged multistation mediastinal lymph nodes, likely reactive. Aortic Atherosclerosis (ICD10-I70.0). Electronically Signed   By: Rogelia Myers M.D.   On: 10/21/2023 18:08    Microbiology: Results for orders placed or performed during the hospital encounter of 10/21/23  Resp panel by RT-PCR (RSV, Flu A&B, Covid) Anterior Nasal Swab     Status: None   Collection Time: 10/21/23  4:23 PM   Specimen: Anterior Nasal Swab  Result Value Ref Range Status   SARS Coronavirus 2 by RT PCR NEGATIVE NEGATIVE Final   Influenza A by PCR NEGATIVE NEGATIVE Final   Influenza B by PCR NEGATIVE NEGATIVE Final    Comment: (NOTE) The Xpert Xpress SARS-CoV-2/FLU/RSV plus assay is intended as an aid in the diagnosis of influenza from Nasopharyngeal swab specimens and should not be used as a sole basis for treatment. Nasal washings and aspirates are unacceptable for Xpert Xpress SARS-CoV-2/FLU/RSV testing.  Fact Sheet for  Patients: BloggerCourse.com  Fact Sheet for Healthcare Providers: SeriousBroker.it  This test is not yet approved or cleared by the [place]  FDA and has been authorized for detection and/or diagnosis of SARS-CoV-2 by FDA under an Emergency Use Authorization (EUA). This EUA will remain in effect (meaning this test can be used) for the duration of the COVID-19 declaration under Section 564(b)(1) of the Act, 21 U.S.C. section 360bbb-3(b)(1), unless the authorization is terminated or revoked.     Resp Syncytial Virus by PCR NEGATIVE NEGATIVE Final    Comment: (NOTE) Fact Sheet for Patients: BloggerCourse.com  Fact Sheet for Healthcare Providers: SeriousBroker.it  This test is not yet approved or cleared by the [place]  FDA and has been authorized for detection and/or diagnosis of SARS-CoV-2 by FDA under an Emergency Use Authorization (EUA). This EUA will remain in effect (meaning this test  can be used) for the duration of the COVID-19 declaration under Section 564(b)(1) of the Act, 21 U.S.C. section 360bbb-3(b)(1), unless the authorization is terminated or revoked.  Performed at [facility], [address], [place], [place] [address]     Labs: CBC: Recent Labs  Lab 10/21/23 1514 10/22/23 0229 10/23/23 0230 10/24/23 0237  WBC 11.1* 9.9 15.5* 15.9*  NEUTROABS  --  9.1*  --   --   HGB 12.9 11.6* 11.6* 11.6*  HCT 41.4 37.1 37.3 38.1  MCV 91.2 89.6 90.8 92.3  PLT 335 289 345 346   Basic Metabolic Panel: Recent Labs  Lab 10/21/23 1514 10/21/23 2106 10/22/23 0229 10/23/23 0230 10/24/23 0237  NA 137  --  137 138 138  K 3.5  --  3.7 3.6 4.0  CL 98  --  97* 97* 97*  CO2 25  --  28 30 31   GLUCOSE 138*  --  210* 289* 234*  BUN 19  --  22 37* 48*  CREATININE 1.06*  --  1.16* 1.48* 1.47*  CALCIUM  9.3  --  8.9 9.5 9.4  MG  --  1.8 2.0 2.1  --   PHOS  --    --  4.8*  --   --    Liver Function Tests: Recent Labs  Lab 10/22/23 0229  AST 23  ALT 14  ALKPHOS 63  BILITOT 0.8  PROT 7.2  ALBUMIN 2.9*   CBG: Recent Labs  Lab 10/23/23 0622 10/23/23 1119 10/23/23 1605 10/23/23 2117 10/24/23 0631  GLUCAP 260* 348* 265* 256* 184*    Discharge time spent: 26 minutes.  Length of inpatient stay: 2 days  Signed: Carliss LELON Canales, DO [facility] 10/24/2023

## 2023-10-24 NOTE — TOC Transition Note (Signed)
 Transition of Care [facility]) - Discharge Note   Patient Details  Name: [name] MRN: [id number] Date of Birth: [birth year]  [facility] CM/SW Contact:  Marval Gell, RN Phone Number: 10/24/2023, 10:29 AM   Clinical Narrative:     Notified HH agency that patient will Dc   Final next level of care: Home w Home Health Services Barriers to Discharge: No Barriers Identified   Patient Goals and CMS Choice            Discharge Placement                       Discharge Plan and Services Additional resources added to the After Visit Summary for                              [facility] Agency: [facility] Date [facility] Agency Contacted: 10/24/23 Time HH Agency Contacted: 1029 Representative spoke with at [facility] Agency: [place]  Social Drivers of Health (SDOH) Interventions SDOH Screenings   Food Insecurity: No Food Insecurity (10/21/2023)  Housing: Low Risk  (10/21/2023)  Transportation Needs: No Transportation Needs (10/22/2023)  Financial Resource Strain: Low Risk  (12/09/2018)  Tobacco Use: Low Risk  (10/21/2023)     Readmission Risk Interventions    10/22/2023    4:58 PM  Readmission Risk Prevention Plan  Medication Screening Complete  Transportation Screening Complete

## 2023-10-24 NOTE — Progress Notes (Signed)
 RT note. Patient noted with labored breathing at this time. Patient sat 93% on 3 L [place] sat goal 92%, Patient also with Bilateral wheeze. Will place patient on continuous pulse ox and change breathing treatments to QID. RT will continue to monitor.     10/24/23 9171  Therapy Vitals  Pulse Rate 64  Resp (!) 24  MEWS Score/Color  MEWS Score 1  MEWS Score Color Green  Respiratory Assessment  Assessment Type Mid-treatment  Respiratory Pattern (S)  Regular;Labored;Dyspnea at rest  Chest Assessment Chest expansion symmetrical  Cough Productive;Congested;Strong  Sputum Amount Small  Sputum Color Tan  Sputum Consistency Thick  Sputum Specimen Source Spontaneous cough  Bilateral Breath Sounds Expiratory wheezes  Oxygen  Therapy/Pulse Ox  O2 Device Nasal Cannula  O2 Therapy Oxygen  humidified  O2 Flow Rate (L/min) 3 L/min  SpO2 92 %

## 2023-10-26 ENCOUNTER — Emergency Department (HOSPITAL_COMMUNITY)

## 2023-10-26 ENCOUNTER — Other Ambulatory Visit: Payer: Self-pay

## 2023-10-26 ENCOUNTER — Encounter (HOSPITAL_COMMUNITY): Payer: Self-pay

## 2023-10-26 ENCOUNTER — Inpatient Hospital Stay (HOSPITAL_COMMUNITY)
Admission: EM | Admit: 2023-10-26 | Discharge: 2023-11-11 | DRG: 189 | Disposition: A | Discharge status: Home / Self Care | Attending: Family Medicine | Admitting: Family Medicine

## 2023-10-26 ENCOUNTER — Telehealth: Payer: Self-pay | Admitting: Cardiovascular Disease

## 2023-10-26 ENCOUNTER — Inpatient Hospital Stay (HOSPITAL_COMMUNITY)

## 2023-10-26 DIAGNOSIS — Z888 Allergy status to other drugs, medicaments and biological substances status: Secondary | ICD-10-CM

## 2023-10-26 DIAGNOSIS — J189 Pneumonia, unspecified organism: Secondary | ICD-10-CM | POA: Diagnosis present

## 2023-10-26 DIAGNOSIS — J849 Interstitial pulmonary disease, unspecified: Secondary | ICD-10-CM | POA: Diagnosis present

## 2023-10-26 DIAGNOSIS — R569 Unspecified convulsions: Secondary | ICD-10-CM | POA: Diagnosis not present

## 2023-10-26 DIAGNOSIS — D649 Anemia, unspecified: Secondary | ICD-10-CM | POA: Diagnosis present

## 2023-10-26 DIAGNOSIS — I5032 Chronic diastolic (congestive) heart failure: Secondary | ICD-10-CM | POA: Diagnosis present

## 2023-10-26 DIAGNOSIS — J8410 Pulmonary fibrosis, unspecified: Secondary | ICD-10-CM | POA: Diagnosis present

## 2023-10-26 DIAGNOSIS — Z794 Long term (current) use of insulin: Secondary | ICD-10-CM

## 2023-10-26 DIAGNOSIS — Z8349 Family history of other endocrine, nutritional and metabolic diseases: Secondary | ICD-10-CM

## 2023-10-26 DIAGNOSIS — Z79899 Other long term (current) drug therapy: Secondary | ICD-10-CM | POA: Diagnosis not present

## 2023-10-26 DIAGNOSIS — J9622 Acute and chronic respiratory failure with hypercapnia: Secondary | ICD-10-CM | POA: Diagnosis present

## 2023-10-26 DIAGNOSIS — I10 Essential (primary) hypertension: Secondary | ICD-10-CM | POA: Diagnosis not present

## 2023-10-26 DIAGNOSIS — Z8542 Personal history of malignant neoplasm of other parts of uterus: Secondary | ICD-10-CM

## 2023-10-26 DIAGNOSIS — R4182 Altered mental status, unspecified: Secondary | ICD-10-CM

## 2023-10-26 DIAGNOSIS — E66812 Obesity, class 2: Secondary | ICD-10-CM | POA: Diagnosis present

## 2023-10-26 DIAGNOSIS — Z87442 Personal history of urinary calculi: Secondary | ICD-10-CM

## 2023-10-26 DIAGNOSIS — Z7984 Long term (current) use of oral hypoglycemic drugs: Secondary | ICD-10-CM

## 2023-10-26 DIAGNOSIS — Z833 Family history of diabetes mellitus: Secondary | ICD-10-CM | POA: Diagnosis not present

## 2023-10-26 DIAGNOSIS — E6609 Other obesity due to excess calories: Secondary | ICD-10-CM | POA: Diagnosis not present

## 2023-10-26 DIAGNOSIS — Z950 Presence of cardiac pacemaker: Secondary | ICD-10-CM | POA: Diagnosis not present

## 2023-10-26 DIAGNOSIS — I2510 Atherosclerotic heart disease of native coronary artery without angina pectoris: Secondary | ICD-10-CM | POA: Diagnosis present

## 2023-10-26 DIAGNOSIS — Z7901 Long term (current) use of anticoagulants: Secondary | ICD-10-CM

## 2023-10-26 DIAGNOSIS — Z8249 Family history of ischemic heart disease and other diseases of the circulatory system: Secondary | ICD-10-CM | POA: Diagnosis not present

## 2023-10-26 DIAGNOSIS — Z9049 Acquired absence of other specified parts of digestive tract: Secondary | ICD-10-CM

## 2023-10-26 DIAGNOSIS — I2723 Pulmonary hypertension due to lung diseases and hypoxia: Secondary | ICD-10-CM | POA: Diagnosis present

## 2023-10-26 DIAGNOSIS — Z66 Do not resuscitate: Secondary | ICD-10-CM | POA: Diagnosis present

## 2023-10-26 DIAGNOSIS — J9621 Acute and chronic respiratory failure with hypoxia: Principal | ICD-10-CM | POA: Diagnosis present

## 2023-10-26 DIAGNOSIS — J9601 Acute respiratory failure with hypoxia: Secondary | ICD-10-CM | POA: Diagnosis not present

## 2023-10-26 DIAGNOSIS — Z6836 Body mass index (BMI) 36.0-36.9, adult: Secondary | ICD-10-CM | POA: Diagnosis not present

## 2023-10-26 DIAGNOSIS — I4891 Unspecified atrial fibrillation: Secondary | ICD-10-CM | POA: Diagnosis not present

## 2023-10-26 DIAGNOSIS — I509 Heart failure, unspecified: Secondary | ICD-10-CM | POA: Diagnosis not present

## 2023-10-26 DIAGNOSIS — I110 Hypertensive heart disease with heart failure: Secondary | ICD-10-CM | POA: Diagnosis present

## 2023-10-26 DIAGNOSIS — I5033 Acute on chronic diastolic (congestive) heart failure: Secondary | ICD-10-CM | POA: Diagnosis not present

## 2023-10-26 DIAGNOSIS — Z951 Presence of aortocoronary bypass graft: Secondary | ICD-10-CM | POA: Diagnosis not present

## 2023-10-26 DIAGNOSIS — R0602 Shortness of breath: Secondary | ICD-10-CM | POA: Diagnosis present

## 2023-10-26 DIAGNOSIS — G9341 Metabolic encephalopathy: Secondary | ICD-10-CM | POA: Diagnosis present

## 2023-10-26 DIAGNOSIS — R400 Somnolence: Secondary | ICD-10-CM | POA: Diagnosis not present

## 2023-10-26 DIAGNOSIS — E876 Hypokalemia: Secondary | ICD-10-CM | POA: Diagnosis not present

## 2023-10-26 DIAGNOSIS — Y95 Nosocomial condition: Secondary | ICD-10-CM | POA: Diagnosis present

## 2023-10-26 DIAGNOSIS — E782 Mixed hyperlipidemia: Secondary | ICD-10-CM | POA: Diagnosis present

## 2023-10-26 DIAGNOSIS — E8809 Other disorders of plasma-protein metabolism, not elsewhere classified: Secondary | ICD-10-CM | POA: Diagnosis present

## 2023-10-26 DIAGNOSIS — E1165 Type 2 diabetes mellitus with hyperglycemia: Secondary | ICD-10-CM | POA: Diagnosis present

## 2023-10-26 DIAGNOSIS — I5030 Unspecified diastolic (congestive) heart failure: Secondary | ICD-10-CM | POA: Diagnosis not present

## 2023-10-26 DIAGNOSIS — Z1152 Encounter for screening for COVID-19: Secondary | ICD-10-CM | POA: Diagnosis not present

## 2023-10-26 DIAGNOSIS — E874 Mixed disorder of acid-base balance: Secondary | ICD-10-CM | POA: Diagnosis present

## 2023-10-26 DIAGNOSIS — Z823 Family history of stroke: Secondary | ICD-10-CM

## 2023-10-26 DIAGNOSIS — Z90711 Acquired absence of uterus with remaining cervical stump: Secondary | ICD-10-CM

## 2023-10-26 DIAGNOSIS — I480 Paroxysmal atrial fibrillation: Secondary | ICD-10-CM | POA: Diagnosis present

## 2023-10-26 DIAGNOSIS — Z8261 Family history of arthritis: Secondary | ICD-10-CM

## 2023-10-26 DIAGNOSIS — E119 Type 2 diabetes mellitus without complications: Secondary | ICD-10-CM | POA: Diagnosis not present

## 2023-10-26 DIAGNOSIS — J9611 Chronic respiratory failure with hypoxia: Secondary | ICD-10-CM | POA: Diagnosis not present

## 2023-10-26 DIAGNOSIS — Z9981 Dependence on supplemental oxygen: Secondary | ICD-10-CM

## 2023-10-26 DIAGNOSIS — Z8673 Personal history of transient ischemic attack (TIA), and cerebral infarction without residual deficits: Secondary | ICD-10-CM

## 2023-10-26 DIAGNOSIS — J9692 Respiratory failure, unspecified with hypercapnia: Secondary | ICD-10-CM | POA: Diagnosis not present

## 2023-10-26 LAB — COMPREHENSIVE METABOLIC PANEL WITH GFR
ALT: 16 U/L (ref 0–44)
AST: 26 U/L (ref 15–41)
Albumin: 3.1 g/dL — ABNORMAL LOW (ref 3.5–5.0)
Alkaline Phosphatase: 51 U/L (ref 38–126)
Anion gap: 14 (ref 5–15)
BUN: 39 mg/dL — ABNORMAL HIGH (ref 8–23)
CO2: 27 mmol/L (ref 22–32)
Calcium: 9.1 mg/dL (ref 8.9–10.3)
Chloride: 99 mmol/L (ref 98–111)
Creatinine, Ser: 1.02 mg/dL — ABNORMAL HIGH (ref 0.44–1.00)
GFR, Estimated: 57 mL/min — ABNORMAL LOW (ref >60)
Glucose, Bld: 75 mg/dL (ref 70–99)
Potassium: 4.5 mmol/L (ref 3.5–5.1)
Sodium: 140 mmol/L (ref 135–145)
Total Bilirubin: 0.6 mg/dL (ref 0.0–1.2)
Total Protein: 6.8 g/dL (ref 6.5–8.1)

## 2023-10-26 LAB — URINALYSIS, W/ REFLEX TO CULTURE (INFECTION SUSPECTED)
Bacteria, UA: NONE SEEN
Bilirubin Urine: NEGATIVE
Glucose, UA: >=500 mg/dL — AB
Ketones, ur: NEGATIVE mg/dL
Leukocytes,Ua: NEGATIVE
Nitrite: NEGATIVE
Protein, ur: NEGATIVE mg/dL
Specific Gravity, Urine: 1.016 (ref 1.005–1.030)
pH: 6 (ref 5.0–8.0)

## 2023-10-26 LAB — RESP PANEL BY RT-PCR (RSV, FLU A&B, COVID)  RVPGX2
Influenza A by PCR: NEGATIVE
Influenza B by PCR: NEGATIVE
Resp Syncytial Virus by PCR: NEGATIVE
SARS Coronavirus 2 by RT PCR: NEGATIVE

## 2023-10-26 LAB — CBC WITH DIFFERENTIAL/PLATELET
Abs Immature Granulocytes: 0.06 K/uL (ref 0.00–0.07)
Basophils Absolute: 0 K/uL (ref 0.0–0.1)
Basophils Relative: 0 %
Eosinophils Absolute: 0.1 K/uL (ref 0.0–0.5)
Eosinophils Relative: 1 %
HCT: 38.9 % (ref 36.0–46.0)
Hemoglobin: 11.9 g/dL — ABNORMAL LOW (ref 12.0–15.0)
Immature Granulocytes: 1 %
Lymphocytes Relative: 19 %
Lymphs Abs: 1.9 K/uL (ref 0.7–4.0)
MCH: 28.2 pg (ref 26.0–34.0)
MCHC: 30.6 g/dL (ref 30.0–36.0)
MCV: 92.2 fL (ref 80.0–100.0)
Monocytes Absolute: 0.9 K/uL (ref 0.1–1.0)
Monocytes Relative: 8 %
Neutro Abs: 7.6 K/uL (ref 1.7–7.7)
Neutrophils Relative %: 71 %
Platelets: 316 K/uL (ref 150–400)
RBC: 4.22 MIL/uL (ref 3.87–5.11)
RDW: 15.1 % (ref 11.5–15.5)
WBC: 10.5 K/uL (ref 4.0–10.5)
nRBC: 0 % (ref 0.0–0.2)

## 2023-10-26 LAB — BRAIN NATRIURETIC PEPTIDE: B Natriuretic Peptide: 323.5 pg/mL — ABNORMAL HIGH (ref 0.0–100.0)

## 2023-10-26 LAB — I-STAT CG4 LACTIC ACID, ED: Lactic Acid, Venous: 2 mmol/L (ref 0.5–1.9)

## 2023-10-26 LAB — PROCALCITONIN: Procalcitonin: <0.1 ng/mL

## 2023-10-26 LAB — GLUCOSE, CAPILLARY
Glucose-Capillary: 79 mg/dL (ref 70–99)
Glucose-Capillary: 83 mg/dL (ref 70–99)

## 2023-10-26 LAB — AMMONIA: Ammonia: 40 umol/L — ABNORMAL HIGH (ref 9–35)

## 2023-10-26 LAB — CBG MONITORING, ED: Glucose-Capillary: 81 mg/dL (ref 70–99)

## 2023-10-26 MED ORDER — PREDNISONE 10 MG PO TABS: 10.0000 mg | ORAL_TABLET | Freq: Every day | ORAL | Status: DC

## 2023-10-26 MED ORDER — POLYETHYLENE GLYCOL 3350 17 G PO PACK: 17.0000 g | PACK | Freq: Every day | ORAL | Status: DC | PRN

## 2023-10-26 MED ORDER — PREDNISONE 20 MG PO TABS: 20.0000 mg | ORAL_TABLET | Freq: Every day | ORAL | Status: DC

## 2023-10-26 MED ORDER — ONDANSETRON HCL 4 MG PO TABS: 4.0000 mg | ORAL_TABLET | Freq: Four times a day (QID) | ORAL | Status: DC | PRN

## 2023-10-26 MED ORDER — APIXABAN 5 MG PO TABS
5.0000 mg | ORAL_TABLET | Freq: Two times a day (BID) | ORAL | Status: DC
  Administered 2023-10-26 – 2023-11-11 (×37): 5 mg via ORAL
  Filled 2023-10-26 (×32): qty 1

## 2023-10-26 MED ORDER — VANCOMYCIN HCL 750 MG/150ML IV SOLN
750.0000 mg | INTRAVENOUS | Status: DC
  Administered 2023-10-27 (×2): 750 mg via INTRAVENOUS
  Filled 2023-10-26: qty 150

## 2023-10-26 MED ORDER — GUAIFENESIN-DM 100-10 MG/5ML PO SYRP
5.0000 mL | ORAL_SOLUTION | ORAL | Status: DC | PRN
  Administered 2023-10-30: 5 mL via ORAL
  Filled 2023-10-26: qty 5

## 2023-10-26 MED ORDER — BISACODYL 5 MG PO TBEC: 5.0000 mg | DELAYED_RELEASE_TABLET | Freq: Every day | ORAL | Status: DC | PRN

## 2023-10-26 MED ORDER — BENZONATATE 100 MG PO CAPS
100.0000 mg | ORAL_CAPSULE | Freq: Two times a day (BID) | ORAL | Status: DC
  Administered 2023-10-26 – 2023-11-11 (×37): 100 mg via ORAL
  Filled 2023-10-26 (×32): qty 1

## 2023-10-26 MED ORDER — SODIUM CHLORIDE 0.9 % IV SOLN
2.0000 g | Freq: Once | INTRAVENOUS | Status: AC
  Administered 2023-10-26 (×2): 2 g via INTRAVENOUS
  Filled 2023-10-26: qty 12.5

## 2023-10-26 MED ORDER — METHYLPREDNISOLONE SODIUM SUCC 125 MG IJ SOLR: 120.0000 mg | Freq: Two times a day (BID) | INTRAMUSCULAR | Status: DC

## 2023-10-26 MED ORDER — INSULIN ASPART 100 UNIT/ML IJ SOLN
0.0000 [IU] | Freq: Every day | INTRAMUSCULAR | Status: DC
  Administered 2023-10-31: 2 [IU] via SUBCUTANEOUS
  Administered 2023-11-01: 3 [IU] via SUBCUTANEOUS
  Administered 2023-11-02: 2 [IU] via SUBCUTANEOUS
  Administered 2023-11-03: 3 [IU] via SUBCUTANEOUS
  Administered 2023-11-05: 2 [IU] via SUBCUTANEOUS
  Administered 2023-11-06 – 2023-11-10 (×3): 3 [IU] via SUBCUTANEOUS

## 2023-10-26 MED ORDER — VANCOMYCIN HCL IN DEXTROSE 1-5 GM/200ML-% IV SOLN: 1000.0000 mg | Freq: Once | INTRAVENOUS | Status: DC

## 2023-10-26 MED ORDER — SODIUM CHLORIDE 0.9% FLUSH
3.0000 mL | Freq: Two times a day (BID) | INTRAVENOUS | Status: DC
  Administered 2023-10-26 – 2023-11-11 (×37): 3 mL via INTRAVENOUS

## 2023-10-26 MED ORDER — PREDNISONE 20 MG PO TABS: 30.0000 mg | ORAL_TABLET | Freq: Every day | ORAL | Status: DC

## 2023-10-26 MED ORDER — METHYLPREDNISOLONE SODIUM SUCC 125 MG IJ SOLR
80.0000 mg | Freq: Two times a day (BID) | INTRAMUSCULAR | Status: DC
  Administered 2023-10-26 (×2): 80 mg via INTRAVENOUS
  Filled 2023-10-26: qty 2

## 2023-10-26 MED ORDER — ACETAMINOPHEN 325 MG PO TABS
650.0000 mg | ORAL_TABLET | Freq: Four times a day (QID) | ORAL | Status: DC | PRN
  Administered 2023-10-27 – 2023-11-08 (×4): 650 mg via ORAL
  Filled 2023-10-26 (×4): qty 2

## 2023-10-26 MED ORDER — ALBUTEROL SULFATE (2.5 MG/3ML) 0.083% IN NEBU
2.5000 mg | INHALATION_SOLUTION | RESPIRATORY_TRACT | Status: DC | PRN
  Administered 2023-11-03 – 2023-11-09 (×3): 2.5 mg via RESPIRATORY_TRACT
  Filled 2023-10-26 (×3): qty 3

## 2023-10-26 MED ORDER — PREDNISONE 20 MG PO TABS: 40.0000 mg | ORAL_TABLET | Freq: Every day | ORAL | Status: DC

## 2023-10-26 MED ORDER — SODIUM CHLORIDE 0.9 % IV SOLN
2.0000 g | Freq: Two times a day (BID) | INTRAVENOUS | Status: DC
  Administered 2023-10-27 (×4): 2 g via INTRAVENOUS
  Filled 2023-10-26 (×2): qty 12.5

## 2023-10-26 MED ORDER — HYDRALAZINE HCL 20 MG/ML IJ SOLN
5.0000 mg | INTRAMUSCULAR | Status: DC | PRN
  Administered 2023-11-06 – 2023-11-07 (×2): 5 mg via INTRAVENOUS
  Filled 2023-10-26 (×2): qty 1

## 2023-10-26 MED ORDER — DOCUSATE SODIUM 100 MG PO CAPS
100.0000 mg | ORAL_CAPSULE | Freq: Two times a day (BID) | ORAL | Status: DC
  Administered 2023-10-26 – 2023-11-11 (×37): 100 mg via ORAL
  Filled 2023-10-26 (×32): qty 1

## 2023-10-26 MED ORDER — ACETAMINOPHEN 650 MG RE SUPP: 650.0000 mg | Freq: Four times a day (QID) | RECTAL | Status: DC | PRN

## 2023-10-26 MED ORDER — SODIUM CHLORIDE 0.9 % IV BOLUS
1000.0000 mL | Freq: Once | INTRAVENOUS | Status: AC
  Administered 2023-10-26 (×2): 1000 mL via INTRAVENOUS

## 2023-10-26 MED ORDER — ONDANSETRON HCL 4 MG/2ML IJ SOLN: 4.0000 mg | Freq: Four times a day (QID) | INTRAMUSCULAR | Status: DC | PRN

## 2023-10-26 MED ORDER — IPRATROPIUM-ALBUTEROL 0.5-2.5 (3) MG/3ML IN SOLN
3.0000 mL | Freq: Four times a day (QID) | RESPIRATORY_TRACT | Status: DC
  Administered 2023-10-26 (×4): 3 mL via RESPIRATORY_TRACT
  Filled 2023-10-26 (×2): qty 3

## 2023-10-26 MED ORDER — INSULIN ASPART 100 UNIT/ML IJ SOLN
0.0000 [IU] | Freq: Three times a day (TID) | INTRAMUSCULAR | Status: DC
  Administered 2023-10-27 (×4): 3 [IU] via SUBCUTANEOUS
  Administered 2023-10-28: 7 [IU] via SUBCUTANEOUS
  Administered 2023-10-28 (×2): 4 [IU] via SUBCUTANEOUS
  Administered 2023-10-28: 7 [IU] via SUBCUTANEOUS
  Administered 2023-10-28 (×2): 4 [IU] via SUBCUTANEOUS
  Administered 2023-10-29: 7 [IU] via SUBCUTANEOUS
  Administered 2023-10-29: 11 [IU] via SUBCUTANEOUS
  Administered 2023-10-29: 4 [IU] via SUBCUTANEOUS
  Administered 2023-10-30: 7 [IU] via SUBCUTANEOUS
  Administered 2023-10-30: 4 [IU] via SUBCUTANEOUS
  Administered 2023-10-30 – 2023-10-31 (×2): 7 [IU] via SUBCUTANEOUS
  Administered 2023-10-31: 3 [IU] via SUBCUTANEOUS
  Administered 2023-10-31: 7 [IU] via SUBCUTANEOUS
  Administered 2023-11-01: 3 [IU] via SUBCUTANEOUS
  Administered 2023-11-01: 4 [IU] via SUBCUTANEOUS
  Administered 2023-11-01: 7 [IU] via SUBCUTANEOUS
  Administered 2023-11-02 (×2): 4 [IU] via SUBCUTANEOUS
  Administered 2023-11-02: 11 [IU] via SUBCUTANEOUS
  Administered 2023-11-03: 15 [IU] via SUBCUTANEOUS
  Administered 2023-11-03 – 2023-11-04 (×3): 4 [IU] via SUBCUTANEOUS
  Administered 2023-11-04: 15 [IU] via SUBCUTANEOUS
  Administered 2023-11-04 – 2023-11-05 (×2): 4 [IU] via SUBCUTANEOUS
  Administered 2023-11-05: 11 [IU] via SUBCUTANEOUS
  Administered 2023-11-05: 4 [IU] via SUBCUTANEOUS
  Administered 2023-11-06 (×3): 7 [IU] via SUBCUTANEOUS
  Administered 2023-11-07 (×2): 11 [IU] via SUBCUTANEOUS
  Administered 2023-11-07: 7 [IU] via SUBCUTANEOUS
  Administered 2023-11-08: 4 [IU] via SUBCUTANEOUS
  Administered 2023-11-08: 7 [IU] via SUBCUTANEOUS
  Administered 2023-11-08: 11 [IU] via SUBCUTANEOUS
  Administered 2023-11-09 – 2023-11-10 (×4): 7 [IU] via SUBCUTANEOUS
  Administered 2023-11-10: 11 [IU] via SUBCUTANEOUS
  Administered 2023-11-11: 7 [IU] via SUBCUTANEOUS

## 2023-10-26 MED ORDER — VANCOMYCIN HCL 1500 MG/300ML IV SOLN
1500.0000 mg | Freq: Once | INTRAVENOUS | Status: AC
  Administered 2023-10-26 (×2): 1500 mg via INTRAVENOUS
  Filled 2023-10-26: qty 300

## 2023-10-26 MED ORDER — METOPROLOL SUCCINATE ER 50 MG PO TB24
50.0000 mg | ORAL_TABLET | Freq: Every day | ORAL | Status: DC
  Administered 2023-10-27 – 2023-11-11 (×18): 50 mg via ORAL
  Filled 2023-10-26 (×16): qty 1

## 2023-10-26 MED ORDER — INSULIN LISPRO PROT & LISPRO (75-25 MIX) 100 UNIT/ML KWIKPEN: 40.0000 [IU] | PEN_INJECTOR | Freq: Three times a day (TID) | SUBCUTANEOUS | Status: DC

## 2023-10-26 NOTE — Telephone Encounter (Signed)
 Dr. Raford ordered an echo for the patient. Patient's had the echo completed at the hospital and wanted to make Dr. Raford aware. Please advise.

## 2023-10-26 NOTE — ED Triage Notes (Signed)
 BIB [facility] from home. Pt complaining of increased lethargy. Pt discharged 2 days ago after dx with pneumonia. Pt wears 3L [place] at baseline but requiring 4-6L [place] with EMS. EMS administered 4 mg of zofran  en route. Pt presenting with a non productive cough.   EMS Vitals  BP: 154/84 HR: 80 Rectal Temp: 99 CBG: 94 GCS: 15

## 2023-10-26 NOTE — H&P (Signed)
 History and Physical    Patient: [name] FMW:[id number] DOB: 1945-10-20 DOA: 10/26/2023 DOS: the patient was seen and examined on 10/26/2023 PCP: Larnell Hamilton, MD  Patient coming from: Home - lives with husband and daughter; LAFE: Daughter, [phone]   Chief Complaint: SOB  HPI: [name] is a 78 y.o. 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  She was last hospitalized from 8/6-9 with an acute exacerbation of pulmonary fibrosis and responded well to IV Solumedrol, empiric antibiotics, nebulizers, supplemental O2. She was discharged on PO Augmentin  + Azithromycin  and a prednisone  taper.  She is currently on 4L home O2.    She was discharged her normal self on Saturday,  Sunday AM, she woke up like this.  Her B arms are very weak.  She is currently complaining that her buttocks is hurting her.  She was unable to hold a cup of water last night.  O2 97%, glucose a little high but on steroids.  ?reaction to abx.  She is continuing to cough more, it's breaking up, cough productive of yellow-green sputum.  She has been confused since yesterday AM.  She complains of headache and is asking for crushed ice.  She is hollering out, which is not normal for her.  She is not more SOB, just AMS - per her daughter.  She was still hospitalized when she started complaining of her head hurting, still hurting (but improved with Tylenol ).    ER Course:   Worsening SOB, increased O2 requirement.  Concern for persistent PNA.     Review of Systems: unable to review all systems due to the inability of the patient to answer questions. Past Medical History:  Diagnosis Date   Asthmatic bronchitis    CHF (congestive heart failure) (HCC) dx'd 2016   Coronary atherosclerosis of native coronary artery    a. s/p CABG 2005 Dr Dusty      Hyperlipidemia    Hypertension    Hypertensive heart disease    Kidney stones    Mixed hyperlipidemia    Nephrolithiasis 06/07/2015   Paroxysmal atrial fibrillation (HCC)    a. s/p Maze in 2005 b. recurrence in March 2016 with >8sec posttermination pauses s/p PPM implant     Pneumonia 1950   double   Presence of permanent cardiac pacemaker    Restrictive lung disease 03/03/2018   Sleep apnea    tested; mask ordered; couldn't afford it; will get it now (09/04/2015)   TIA (transient ischemic attack)    Type II diabetes mellitus (HCC)    Uterine cancer [facility])    age 78 with partial hysterectomy   Past Surgical History:  Procedure Laterality Date   AV NODE ABLATION N/A 03/28/2019   Procedure: AV NODE ABLATION;  Surgeon: Waddell Danelle ORN, MD;  Location: [facility] INVASIVE CV LAB;  Service: Cardiovascular;  Laterality: N/A;   CARDIAC CATHETERIZATION  2005   CORONARY ARTERY BYPASS GRAFT  2005   Dr Dusty with MAZE   CYSTOSCOPY WITH URETEROSCOPY, STONE BASKETRY AND STENT PLACEMENT Left 09/08/2014   Procedure: CYSTOSCOPY WITH URETEROSCOPY, STONE BASKETRY AND STENT PLACEMENT;  Surgeon: Belvie LITTIE Clara, MD;  Location: [facility] ORS;  Service: Urology;  Laterality: Left;   CYSTOSCOPY/URETEROSCOPY/HOLMIUM LASER/STENT PLACEMENT Left 09/15/2014   Procedure: CYSTOSCOPY/RETROGRADE/URETEROSCOPY/HOLMIUM LASER/ STONE EXTRACTION /STENT EXCHANGE;  Surgeon: Belvie LITTIE Clara, MD;  Location: [facility] OR[facility];  Service: Urology;  Laterality: Left;   HOLMIUM LASER APPLICATION Left 09/08/2014   Procedure: HOLMIUM LASER APPLICATION;  Surgeon: Belvie LITTIE Clara, MD;  Location: [facility] OR[facility];  Service: Urology;  Laterality: Left;   HOLMIUM LASER APPLICATION Left 09/15/2014   Procedure: HOLMIUM LASER APPLICATION;  Surgeon: Belvie LITTIE Clara, MD;  Location: [facility] OR[facility];  Service: Urology;  Laterality: Left;   INSERT / REPLACE / REMOVE PACEMAKER     LAPAROSCOPIC CHOLECYSTECTOMY     PERMANENT PACEMAKER INSERTION N/A 06/05/2014   MDT Advisa 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 She has never used smokeless tobacco. She reports that she does not drink alcohol  and does not use drugs.  Allergies  Allergen Reactions   Amiodarone  Other (See Comments)     pulmonary fibrosis   Gabapentin  Swelling    edema    Family History  Problem Relation Age of Onset   Hypertension Mother    Hyperlipidemia Mother    Heart disease Mother    Diabetes Father    Stroke Father    Heart disease Father    Hypertension Sister        3 sisters   Arthritis Brother    Heart attack Maternal Grandfather    Stroke Paternal Grandfather    Hypertension Daughter     Prior to Admission medications   Medication Sig Start Date End Date Taking? Authorizing Provider  amoxicillin -clavulanate (AUGMENTIN ) 875-125 MG tablet Take 1 tablet by mouth 2 (two) times daily for 3 days. 10/24/23 10/27/23 Yes Arlon Carliss ORN, DO  apixaban  (ELIQUIS ) 5 MG TABS tablet TAKE ONE TABLET TWICE DAILY 08/03/23  Yes Burnard Debby LABOR, MD  azithromycin  (ZITHROMAX ) 500 MG tablet Take 1 tablet (500 mg total) by mouth daily for 3 days. 10/24/23 10/27/23 Yes Arlon Carliss ORN, DO  B Complex-Biotin-FA (B-COMPLEX PO) Take 1 capsule by mouth every morning.    Yes [provider]  benzonatate  (TESSALON ) 100 MG capsule Take 1 capsule (100 mg total) by mouth 2 (two) times daily. 10/24/23  Yes Arlon Carliss ORN, DO  Biotin 1000 MCG tablet Take 1,000 mcg by mouth daily.   Yes [provider]  Chlorpheniramine Maleate (ALLERGY RELIEF  PO) Take 2 tablets by mouth as needed (allergies).   Yes [provider]  Coenzyme Q10 (CO Q-10) 200 MG CAPS Take 200 mg by mouth daily.   Yes [provider]  Evolocumab  (REPATHA  SURECLICK) 140 MG/ML SOAJ Inject 140 mg into the skin every 14 (fourteen) days. 09/25/23  Yes Raford Riggs, MD  FARXIGA 10 MG TABS tablet Take 10 mg by mouth daily. 05/24/22  Yes [provider]  furosemide  (LASIX ) 40 MG tablet Take 1 tablet (40 mg total) by mouth 2 (two) times daily. 09/25/23  Yes Raford Riggs, MD  guaiFENesin -dextromethorphan  (ROBITUSSIN DM) 100-10 MG/5ML syrup Take 5 mLs by mouth every 4 (four) hours as needed for cough. 10/24/23  Yes Arlon Carliss ORN, DO  HUMALOG  MIX 75/25 KWIKPEN (75-25) 100 UNIT/ML KwikPen Inject 40 Units into the skin in the morning, at noon,  and at bedtime.   Yes [provider]  ipratropium-albuterol  (DUONEB) 0.5-2.5 (3) MG/3ML SOLN Use 1 vial (3 mLs) by nebulization 4 (four) times daily. 10/24/23  Yes Arlon Carliss ORN, DO  metolazone (ZAROXOLYN) 2.5 MG tablet Take 2.5 mg by mouth daily as needed (weight gain > 5lbs).   Yes [provider]  metoprolol  succinate (TOPROL -XL) 50 MG 24 hr tablet Take 1 tablet (50 mg total) by mouth daily. 09/25/23  Yes Raford Riggs, MD  OXYGEN  Inhale 2 L into the lungs continuous.   Yes [provider]  predniSONE  (DELTASONE ) 10 MG tablet Take 4 tablets (40 mg total) by mouth daily for 3 days, THEN 3 tablets (30 mg total) daily for 3 days, THEN 2 tablets (20 mg total) daily for 3 days, THEN 1 tablet (10 mg total) daily for 3 days. 10/24/23 11/05/23 Yes Arlon Carliss ORN, DO    Physical Exam: Vitals:   10/26/23 1500 10/26/23 1700 10/26/23 1749 10/26/23 1919  BP: (!) 147/63 (!) 152/61 (!) 145/60   Pulse: (!) 59 (!) 59 61   Resp: (!) 23 (!) 21 19   Temp:  98.4 F (36.9 C) 98 F (36.7 C)   TempSrc:  Oral Oral   SpO2: 99% 100% 97% 94%  Weight:      Height:       General:  Appears calm and  comfortable and is in NAD, clearly confused, calling out about buttocks pain periodically Eyes:   normal lids, iris ENT:  grossly normal hearing, lips & tongue, mmm Cardiovascular:  RR with mild bradycardia. No LE edema.  Respiratory:   Diffuse R > L rhonchi.  Normal respiratory effort on 4L [place] O2. Abdomen:  soft, NT, ND Skin:  no rash or induration seen on limited exam Musculoskeletal: decreased strength of BUE 4/5, no bony abnormality Psychiatric:  blunted and mildly confused mood and affect, speech fluent but mildly inappropriate, AOx3 Neurologic:  CN 2-12 grossly intact, moves all extremities in coordinated fashion with mildly decreased strength of BUE (symmetric)   Radiological Exams on Admission: Independently reviewed - see discussion in A/P where applicable  CT HEAD WO CONTRAST ( ) Result Date: 10/26/2023 CLINICAL DATA:  Altered mental status, nontraumatic (Ped 0-17y) EXAM: CT HEAD WITHOUT CONTRAST TECHNIQUE: Contiguous axial images were obtained from the base of the skull through the vertex without intravenous contrast. RADIATION DOSE REDUCTION: This exam was performed according to the departmental dose-optimization program which includes automated exposure control, adjustment of the mA and/or kV according to patient size and/or use of iterative reconstruction technique. COMPARISON:  May 23, 2010 FINDINGS: Brain: The ventricles appear age appropriate. No mass effect or midline shift. Gray-white differentiation is preserved without focal attenuation abnormality.No evidence of acute territorial infarction, extra-axial fluid collection, hemorrhage, or mass lesion. The basilar cisterns are patent without downward herniation. The cerebellar hemispheres and vermis are well formed without mass lesion or focal attenuation abnormality. Vascular: No hyperdense vessel. Calcified atherosclerotic plaque within the cavernous/supraclinoid ICA and intradural vertebral arteries. Skull: Normal. Negative  for fracture or focal lesion. Sinuses/Orbits: The paranasal sinuses and mastoids are clear.The globes appear intact. No retrobulbar hematoma. Other: None. IMPRESSION: No acute intracranial abnormality, specifically, no acute hemorrhage, territorial infarction, or intracranial mass. Electronically Signed   By: Rogelia Myers M.D.   On: 10/26/2023 17:01   DG Chest Portable 1 View Result Date: 10/26/2023 CLINICAL DATA:  Pneumonia EXAM: PORTABLE CHEST 1 VIEW COMPARISON:  Chest radiograph October 21, 2021 at 12:35 p.m. FINDINGS: Cardiomediastinal silhouette  is enlarged and obscured by overlying bilateral patchy airspace opacities of both lung fields, increased to prior. Aortic knob is prominent and calcified. Left chest wall dual lead pacer in place with the leads overlying the right atrium and ventricle. Blunting of left costophrenic angle increased to prior. No acute osseous abnormality. Status post median sternotomy and CABG. IMPRESSION: Interval increase in bilateral patchy airspace opacities (edema/multifocal infection/inflammation), left greater than right with new blunting of left costophrenic angle query atelectasis versus effusion. Electronically Signed   By: Megan  Zare M.D.   On: 10/26/2023 13:49    EKG: Independently reviewed.  Ventricularly paced with rate 60   Labs on Admission: I have personally reviewed the available labs and imaging studies at the time of the admission.  Pertinent labs:    BUN 39/Creatinine 1.02/GFR 57 Stable CBC Lactate 2 BNP 323.5, 265.7 on 8/7 COVID/flu/RSV negative UA: >500 glucose, small Hgb Blood cultures pending   Assessment and Plan: Principal Problem:   ILD (interstitial lung disease) (HCC) Active Problems:   Pacemaker- March 2016    Hx of CABG 2005   Paroxysmal atrial fibrillation (HCC)   Class 2 obesity due to excess calories with body mass index (BMI) of 36.0 to 36.9 in adult   Mixed hyperlipidemia   Poorly controlled diabetes mellitus (HCC)    Hypertension   Chronic diastolic CHF (congestive heart failure) (HCC)    ILD Flare Patient with recent hospitalization for ILD flare She was medically stable at the time of discharge and remained so during her first day home On the day after dc, however, she was altered with inappropriate behaviors She did have a mildly increased O2 requirement, as well Repeat imaging today shows interval increase in B patchy airspace opacities, L>R She was given Cefepime  and Vanc in the ER, will continue (had been on Augmentin  and Azithromycin  at home) Discharged on steroid taper but will change back to Solumedrol with BID dosing Will consult pulmonology If this is not an exacerbation, then her disease has begun the rapid progression phase and palliative care or even Hospice may be reasonable.   Chronic HFpEF Appears compensated Hold furosemide  for now  CAD S/p CABG No c/o CP  Afib Has pacemaker Rate controlled with Toprol  XL Continue Eliquis   HLD On Repatha  at home  DM A1c 7.4, reasonable but not ideal control Hold Farxiga Continue 75/25 Cover with moderate-scale SSI Carb modified diet   Class 2 obesity Body mass index is 36.79 kg/m.SABRA  Weight loss should be encouraged Outpatient PCP/bariatric medicine f/u encouraged Significantly low or high BMI is associated with higher medical risk including morbidity and mortality   DNR I have discussed code status with the patient/family and they are in agreement that the patient would not desire resuscitation and would prefer to die a natural death should that situation arise.      Advance Care Planning:   Code Status: Limited: Do not attempt resuscitation (DNR) -DNR-LIMITED -Do Not Intubate/DNI    Consults: Pulmonology; PT/OT  DVT Prophylaxis: Eliquis   Family Communication: Daughter was present throughout evaluation  Severity of Illness: The appropriate patient status for this patient is INPATIENT. Inpatient status is judged to be  reasonable and necessary in order to provide the required intensity of service to ensure the patient's safety. The patient's presenting symptoms, physical exam findings, and initial radiographic and laboratory data in the context of their chronic comorbidities is felt to place them at high risk for further clinical deterioration. Furthermore, it is not anticipated that  the patient will be medically stable for discharge from the hospital within 2 midnights of admission.   * I certify that at the point of admission it is my clinical judgment that the patient will require inpatient hospital care spanning beyond 2 midnights from the point of admission due to high intensity of service, high risk for further deterioration and high frequency of surveillance required.*  Author: Delon Herald, MD 10/26/2023 7:39 PM  For on call review [email or web address].

## 2023-10-26 NOTE — ED Provider Notes (Signed)
 [place] EMERGENCY DEPARTMENT AT [facility] Provider Note   CSN: [id number] Arrival date & time: 10/26/23  1116     Patient presents with: Shortness of Breath and Weakness   [name] is a 78 y.o. female.  With a history of atrial fibrillation on Eliquis , ICD, heart failure, type 2 diabetes, CAD status post CABG, and restrictive lung disease who presents to the ED for shortness of breath.  Patient was recently discharged 2 days ago after being treated for community-acquired pneumonia.  Typically wears 2 to 3 L at home but has required 4 L over the last 24 hours.  Increased productive coughing.  Some nausea today as well and increased lethargy as noted by her family members.    Shortness of Breath Weakness Associated symptoms: shortness of breath        Prior to Admission medications   Medication Sig Start Date End Date Taking? Authorizing Provider  amoxicillin -clavulanate (AUGMENTIN ) 875-125 MG tablet Take 1 tablet by mouth 2 (two) times daily for 3 days. 10/24/23 10/27/23 Yes Arlon Carliss ORN, DO  apixaban  (ELIQUIS ) 5 MG TABS tablet TAKE ONE TABLET TWICE DAILY 08/03/23  Yes Burnard Debby LABOR, MD  azithromycin  (ZITHROMAX ) 500 MG tablet Take 1 tablet (500 mg total) by mouth daily for 3 days. 10/24/23 10/27/23 Yes Arlon Carliss ORN, DO  B Complex-Biotin-FA (B-COMPLEX PO) Take 1 capsule by mouth every morning.    Yes [provider]  benzonatate  (TESSALON ) 100 MG capsule Take 1 capsule (100 mg total) by mouth 2 (two) times daily. 10/24/23  Yes Arlon Carliss ORN, DO  Biotin 1000 MCG tablet Take 1,000 mcg by mouth daily.   Yes [provider]  Chlorpheniramine Maleate (ALLERGY RELIEF PO) Take 2 tablets by mouth as needed (allergies).   Yes [provider]  Coenzyme Q10 (CO Q-10) 200 MG CAPS Take 200 mg by mouth daily.   Yes [provider]  Evolocumab  (REPATHA  SURECLICK) 140 MG/ML SOAJ Inject 140 mg into the skin every 14 (fourteen) days. 09/25/23   Yes Raford Riggs, MD  FARXIGA 10 MG TABS tablet Take 10 mg by mouth daily. 05/24/22  Yes [provider]  furosemide  (LASIX ) 40 MG tablet Take 1 tablet (40 mg total) by mouth 2 (two) times daily. 09/25/23  Yes Raford Riggs, MD  guaiFENesin -dextromethorphan  (ROBITUSSIN DM) 100-10 MG/5ML syrup Take 5 mLs by mouth every 4 (four) hours as needed for cough. 10/24/23  Yes Arlon Carliss ORN, DO  HUMALOG  MIX 75/25 KWIKPEN (75-25) 100 UNIT/ML KwikPen Inject 40 Units into the skin in the morning, at noon, and at bedtime.   Yes [provider]  ipratropium-albuterol  (DUONEB) 0.5-2.5 (3) MG/3ML SOLN Use 1 vial (3 mLs) by nebulization 4 (four) times daily. 10/24/23  Yes Arlon Carliss ORN, DO  metolazone (ZAROXOLYN) 2.5 MG tablet Take 2.5 mg by mouth daily as needed (weight gain > 5lbs).   Yes [provider]  metoprolol  succinate (TOPROL -XL) 50 MG 24 hr tablet Take 1 tablet (50 mg total) by mouth daily. 09/25/23  Yes Raford Riggs, MD  OXYGEN  Inhale 2 L into the lungs continuous.   Yes [provider]  predniSONE  (DELTASONE ) 10 MG tablet Take 4 tablets (40 mg total) by mouth daily for 3 days, THEN 3 tablets (30 mg total) daily for 3 days, THEN 2 tablets (20 mg total) daily for 3 days, THEN 1 tablet (10 mg total) daily for 3 days. 10/24/23 11/05/23 Yes Arlon Carliss ORN, DO    Allergies:  Amiodarone  and Gabapentin     Review of Systems  Respiratory:  Positive for shortness of breath.   Neurological:  Positive for weakness.    Updated Vital Signs BP (!) 147/63   Pulse (!) 59   Temp 98.4 F (36.9 C) (Oral)   Resp (!) 23   Ht 5' 3 (1.6 m)   Wt 94.2 kg   SpO2 99%   BMI 36.79 kg/m   Physical Exam Vitals and nursing note reviewed.  HENT:     Head: Normocephalic and atraumatic.  Eyes:     Pupils: Pupils are equal, round, and reactive to light.  Cardiovascular:     Rate and Rhythm: Normal rate and regular rhythm.  Pulmonary:     Effort: Pulmonary effort is normal.      Breath sounds: Examination of the right-lower field reveals rales. Examination of the left-lower field reveals rales. Rales present.  Abdominal:     Palpations: Abdomen is soft.     Tenderness: There is no abdominal tenderness.  Skin:    General: Skin is warm and Paul.  Neurological:     General: No focal deficit present.     Mental Status: She is alert and oriented to person, place, and time.  Psychiatric:        Mood and Affect: Mood normal.     (all labs ordered are listed, but only abnormal results are displayed) Labs Reviewed  COMPREHENSIVE METABOLIC PANEL WITH GFR - Abnormal; Notable for the following components:      Result Value   BUN 39 (*)    Creatinine, Ser 1.02 (*)    Albumin 3.1 (*)    GFR, Estimated 57 (*)    All other components within normal limits  CBC WITH DIFFERENTIAL/PLATELET - Abnormal; Notable for the following components:   Hemoglobin 11.9 (*)    All other components within normal limits  BRAIN NATRIURETIC PEPTIDE - Abnormal; Notable for the following components:   B Natriuretic Peptide 323.5 (*)    All other components within normal limits  URINALYSIS, W/ REFLEX TO CULTURE (INFECTION SUSPECTED) - Abnormal; Notable for the following components:   Glucose, UA >=500 (*)    Hgb urine dipstick SMALL (*)    All other components within normal limits  I-STAT CG4 LACTIC ACID, ED - Abnormal; Notable for the following components:   Lactic Acid, Venous 2.0 (*)    All other components within normal limits  RESP PANEL BY RT-PCR (RSV, FLU A&B, COVID)  RVPGX2  CULTURE, BLOOD (ROUTINE X 2)  CULTURE, BLOOD (ROUTINE X 2)  I-STAT CG4 LACTIC ACID, ED  CBG MONITORING, ED    EKG: EKG Interpretation Date/Time:  Monday October 26 2023 11:53:34 EDT Ventricular Rate:  60 PR Interval:  38 QRS Duration:  160 QT Interval:  463 QTC Calculation: 463 R Axis:   269  Text Interpretation: Ventricular-paced rhythm No further analysis attempted due to paced rhythm Confirmed  by Pamella Sharper [id number]) on 10/26/2023 12:12:02 PM  Radiology: DG Chest Portable 1 View Result Date: 10/26/2023 CLINICAL DATA:  Pneumonia EXAM: PORTABLE CHEST 1 VIEW COMPARISON:  Chest radiograph October 21, 2021 at 12:35 p.m. FINDINGS: Cardiomediastinal silhouette is enlarged and obscured by overlying bilateral patchy airspace opacities of both lung fields, increased to prior. Aortic knob is prominent and calcified. Left chest wall dual lead pacer in place with the leads overlying the right atrium and ventricle. Blunting of left costophrenic angle increased to prior. No acute osseous abnormality. Status post median sternotomy and CABG.  IMPRESSION: Interval increase in bilateral patchy airspace opacities (edema/multifocal infection/inflammation), left greater than right with new blunting of left costophrenic angle query atelectasis versus effusion. Electronically Signed   By: Megan  Zare M.D.   On: 10/26/2023 13:49     Procedures   Medications Ordered in the ED  vancomycin  (VANCOREADY) IVPB 1500 mg/300 mL (1,500 mg Intravenous New Bag/Given 10/26/23 1506)  sodium chloride  0.9 % bolus 1,000 mL (0 mLs Intravenous Stopped 10/26/23 1532)  ceFEPIme  (MAXIPIME ) 2 g in sodium chloride  0.9 % 100 mL IVPB (0 g Intravenous Stopped 10/26/23 1504)    Clinical Course as of 10/26/23 1608  Mon Oct 26, 2023  1410 Chest x-ray concerning for multifocal pneumonia.  Will cover for hospital-acquired pneumonia.  His lactic elevated.  Will provide IV fluids and recheck.  COVID flu RSV all negative.  Increased O2 requirement with productive coughing.  Discussed admitting hospitalist accepts patient for admission [MP]    Clinical Course User Index [MP] Pamella Ozell LABOR, DO                                 Medical Decision Making 78 year old female with history as above returns to the emergency department for shortness of breath.  Was just discharged here 2 days ago after being treated for pneumonia.  Increased productive  coughing and O2 requirement here.  Stable on 4 L nasal cannula which is up from her baseline.  Productive coughing.  Crackles at bilateral bases on my exam.  Slightly hypertensive and afebrile.  Presentation most concerning for heart failure exacerbation versus recurrent pneumonia viral respiratory illness, urinary tract infection or respiratory failure due to restrictive lung disease.  Will obtain chest x-ray laboratory workup EKG and continue to monitor here.  Readmission likely.  Amount and/or Complexity of Data Reviewed Labs: ordered. Radiology: ordered.  Risk Prescription drug management. Decision regarding hospitalization.        Final diagnoses:  Healthcare-associated pneumonia    ED Discharge Orders     None          Pamella Ozell LABOR, DO 10/26/23 1608

## 2023-10-26 NOTE — Consult Note (Signed)
 NAME:  [name], MRN:  [id number], DOB:  01/18/1946, LOS: 0 ADMISSION DATE:  10/26/2023, CONSULTATION DATE:  10/26/23 REFERRING MD:  Barbarann Nest MD, CHIEF COMPLAINT:  AMS / weakness   History of Present Illness:  78 year old female patient with history of chronic hypoxic respiratory failure secondary to ILD, HFpEF, CAD, diabetes recent hospitalization for acute exacerbation of pulmonary fibrosis given supportive measures, steroids, antibiotics, bronchodilators discharged on Augmentin  and azithromycin  with steroid taper. Patient presenting with weakness, cough, more confusion per the family. In the ER, patient was persistently short of breath, chest film revealed left-sided airspace opacities.  Station started on antibiotics started on steroids.  Pulmonary asked to see this patient for evaluation for underlying ILD and ongoing comorbidities.  Pertinent  Medical History   Past Medical History:  Diagnosis Date   Asthmatic bronchitis    CHF (congestive heart failure) (HCC) dx'd 2016   Coronary atherosclerosis of native coronary artery    a. s/p CABG 2005 Dr Dusty     Hyperlipidemia    Hypertension    Hypertensive heart disease    Kidney stones    Mixed hyperlipidemia    Nephrolithiasis 06/07/2015   Paroxysmal atrial fibrillation (HCC)    a. s/p Maze in 2005 b. recurrence in March 2016 with >8sec posttermination pauses s/p PPM implant     Pneumonia 1950   double   Presence of permanent cardiac pacemaker    Restrictive lung disease 03/03/2018   Sleep apnea    tested; mask ordered; couldn't afford it; will get it now (09/04/2015)   TIA (transient ischemic attack)    Type II diabetes mellitus (HCC)    Uterine cancer [facility])    age 30 with partial hysterectomy     Significant Hospital Events: Including procedures, antibiotic start and stop dates in addition to other pertinent events     Interim History / Subjective:  10/26/23 admit for SOB/ ILD / AMS  Objective     Blood pressure (!) 145/60, pulse 61, temperature 98 F (36.7 C), temperature source Oral, resp. rate 19, height 5' 3 (1.6 m), weight 94.2 kg, SpO2 97%.        Intake/Output Summary (Last 24 hours) at 10/26/2023 1913 Last data filed at 10/26/2023 1504 Gross per 24 hour  Intake 100.93 ml  Output --  Net 100.93 ml   Filed Weights   10/26/23 1135  Weight: 94.2 kg    Examination: General: awake, alert HENT: moist mucous membranes, normal hearing Lungs: bilateral rhonchi, no wheezing Cardiovascular: RRR, no murmur Abdomen: soft, nondistended Extremities: no LE edema, no cyanosis Neuro: CNII to CNXII grossly intact, moves all extremities   Resolved problem list   Assessment and Plan   Acute exacerbation of ILD: - complicated with underlying severe pneumonia - agree with IV Vanco/Cefepime , f/u culture results - nebulizers/bronchodilators - IV Solumedrol 60mg  q6h CXR reviewed  AMS: - multifactorial at this time, likely metabolic or sepsis-mediated - patient also malnourished - f/u labs, check ammonia - may need NG tube or discussion of PEG if patient unable to tolerate feeds - PT/OT/Speech  CHF - for now hold diuretics, but can consider restarting when mental status improves or if patient fails respiratory treatment.  Resume medical management per Hospitalist team.  Thank you for this Pulmonary consult.   Best Practice (right click and Reselect all SmartList Selections daily)   Diet/type: Regular consistency (see orders) DVT prophylaxis DOAC Pressure ulcer(s): N/A GI prophylaxis: N/A Lines: N/A Foley:  N/A Code Status:  limited - DNI Last date of multidisciplinary goals of care discussion [10/26/23]  Labs   CBC: Recent Labs  Lab 10/21/23 1514 10/22/23 0229 10/23/23 0230 10/24/23 0237 10/26/23 1146  WBC 11.1* 9.9 15.5* 15.9* 10.5  NEUTROABS  --  9.1*  --   --  7.6  HGB 12.9 11.6* 11.6* 11.6* 11.9*  HCT 41.4 37.1 37.3 38.1 38.9  MCV 91.2 89.6 90.8  92.3 92.2  PLT 335 289 345 346 316    Basic Metabolic Panel: Recent Labs  Lab 10/21/23 1514 10/21/23 2106 10/22/23 0229 10/23/23 0230 10/24/23 0237 10/26/23 1146  NA 137  --  137 138 138 140  K 3.5  --  3.7 3.6 4.0 4.5  CL 98  --  97* 97* 97* 99  CO2 25  --  28 30 31 27   GLUCOSE 138*  --  210* 289* 234* 75  BUN 19  --  22 37* 48* 39*  CREATININE 1.06*  --  1.16* 1.48* 1.47* 1.02*  CALCIUM  9.3  --  8.9 9.5 9.4 9.1  MG  --  1.8 2.0 2.1  --   --   PHOS  --   --  4.8*  --   --   --    GFR: Estimated Creatinine Clearance: 50.4 mL/min (A) (by C-G formula based on SCr of 1.02 mg/dL (H)). Recent Labs  Lab 10/21/23 2106 10/22/23 0229 10/23/23 0230 10/24/23 0237 10/26/23 1146 10/26/23 1321  PROCALCITON <0.10  --   --   --   --   --   WBC  --  9.9 15.5* 15.9* 10.5  --   LATICACIDVEN  --   --   --   --   --  2.0*    Liver Function Tests: Recent Labs  Lab 10/22/23 0229 10/26/23 1146  AST 23 26  ALT 14 16  ALKPHOS 63 51  BILITOT 0.8 0.6  PROT 7.2 6.8  ALBUMIN 2.9* 3.1*   No results for input(s): LIPASE, AMYLASE in the last 168 hours. No results for input(s): AMMONIA in the last 168 hours.  ABG    Component Value Date/Time   HCO3 34.5 (H) 10/22/2023 0229   TCO2 37 (H) 02/26/2022 1245   O2SAT 93.3 10/22/2023 0229     Coagulation Profile: No results for input(s): INR, PROTIME in the last 168 hours.  Cardiac Enzymes: No results for input(s): CKTOTAL, CKMB, CKMBINDEX, TROPONINI in the last 168 hours.  HbA1C: Hgb A1c MFr Bld  Date/Time Value Ref Range Status  10/21/2023 09:04 PM 7.4 (H) 4.8 - 5.6 % Final    Comment:    (NOTE)         Prediabetes: 5.7 - 6.4         Diabetes: >6.4         Glycemic control for adults with diabetes: <7.0   12/09/2018 04:59 AM 10.9 (H) 4.8 - 5.6 % Final    Comment:    (NOTE) Pre diabetes:          5.7%-6.4% Diabetes:              >6.4% Glycemic control for   <7.0% adults with diabetes     CBG: Recent  Labs  Lab 10/23/23 2117 10/24/23 0631 10/24/23 1136 10/26/23 1504 10/26/23 1748  GLUCAP 256* 184* 190* 81 79    Review of Systems:   Review of Systems - Negative except what is stated in the HPI   Past Medical History:  She,  has a  past medical history of Asthmatic bronchitis, CHF (congestive heart failure) (HCC) (dx'd 2016), Coronary atherosclerosis of native coronary artery, Hyperlipidemia, Hypertension, Hypertensive heart disease, Kidney stones, Mixed hyperlipidemia, Nephrolithiasis (06/07/2015), Paroxysmal atrial fibrillation (HCC), Pneumonia (1950), Presence of permanent cardiac pacemaker, Restrictive lung disease (03/03/2018), Sleep apnea, TIA (transient ischemic attack), Type II diabetes mellitus (HCC), and Uterine cancer (HCC).   Surgical History:   Past Surgical History:  Procedure Laterality Date   AV NODE ABLATION N/A 03/28/2019   Procedure: AV NODE ABLATION;  Surgeon: Waddell Danelle ORN, MD;  Location: [facility] INVASIVE CV LAB;  Service: Cardiovascular;  Laterality: N/A;   CARDIAC CATHETERIZATION  2005   CORONARY ARTERY BYPASS GRAFT  2005   Dr Dusty with MAZE   CYSTOSCOPY WITH URETEROSCOPY, STONE BASKETRY AND STENT PLACEMENT Left 09/08/2014   Procedure: CYSTOSCOPY WITH URETEROSCOPY, STONE BASKETRY AND STENT PLACEMENT;  Surgeon: Belvie LITTIE Clara, MD;  Location: [facility];  Service: Urology;  Laterality: Left;   CYSTOSCOPY/URETEROSCOPY/HOLMIUM LASER/STENT PLACEMENT Left 09/15/2014   Procedure: CYSTOSCOPY/RETROGRADE/URETEROSCOPY/HOLMIUM LASER/ STONE EXTRACTION /STENT EXCHANGE;  Surgeon: Belvie LITTIE Clara, MD;  Location: [facility];  Service: Urology;  Laterality: Left;   HOLMIUM LASER APPLICATION Left 09/08/2014   Procedure: HOLMIUM LASER APPLICATION;  Surgeon: Belvie LITTIE Clara, MD;  Location: [facility];  Service: Urology;  Laterality: Left;   HOLMIUM LASER APPLICATION Left 09/15/2014   Procedure: HOLMIUM LASER APPLICATION;  Surgeon: Belvie LITTIE Clara, MD;  Location: [facility];  Service: Urology;   Laterality: Left;   INSERT / REPLACE / REMOVE PACEMAKER     LAPAROSCOPIC CHOLECYSTECTOMY     PERMANENT PACEMAKER INSERTION N/A 06/05/2014   MDT Advisa dual chamber 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 She has never used smokeless tobacco. She reports that she does not drink alcohol  and does not use drugs.   Family History:  Her family history includes Arthritis in her brother; Diabetes in her father; Heart attack in her maternal grandfather; Heart disease in her father and mother; Hyperlipidemia in her mother; Hypertension in her daughter, mother, and sister; Stroke in her father and paternal grandfather.   Allergies Allergies  Allergen Reactions   Amiodarone  Other (See Comments)     pulmonary fibrosis   Gabapentin  Swelling    edema     Home Medications  Prior to Admission medications   Medication Sig Start Date End Date Taking? Authorizing Provider  amoxicillin -clavulanate (AUGMENTIN ) 875-125 MG tablet Take 1 tablet by mouth 2 (two) times daily for 3 days. 10/24/23 10/27/23 Yes Arlon Carliss ORN, DO  apixaban  (ELIQUIS ) 5 MG TABS tablet TAKE ONE TABLET TWICE DAILY 08/03/23  Yes Burnard Debby LABOR, MD  azithromycin  (ZITHROMAX ) 500 MG tablet Take 1 tablet (500 mg total) by mouth daily for 3 days. 10/24/23 10/27/23 Yes Arlon Carliss ORN, DO  B Complex-Biotin-FA (B-COMPLEX PO) Take 1 capsule by mouth every morning.    Yes [provider]  benzonatate  (TESSALON ) 100 MG capsule Take 1 capsule (100 mg total) by mouth 2 (two) times daily.  10/24/23  Yes Arlon Carliss  W, DO  Biotin 1000 MCG tablet Take 1,000 mcg by mouth daily.   Yes [provider]  Chlorpheniramine Maleate (ALLERGY RELIEF PO) Take 2 tablets by mouth as needed (allergies).   Yes [provider]  Coenzyme Q10 (CO Q-10) 200 MG CAPS Take 200 mg by mouth daily.   Yes [provider]  Evolocumab  (REPATHA  SURECLICK) 140 MG/ML SOAJ Inject 140 mg into the skin every 14 (fourteen) days. 09/25/23  Yes Raford Riggs, MD  FARXIGA 10 MG TABS tablet Take 10 mg by mouth daily. 05/24/22  Yes [provider]  furosemide  (LASIX ) 40 MG tablet Take 1 tablet (40 mg total) by mouth 2 (two) times daily. 09/25/23  Yes Raford Riggs, MD  guaiFENesin -dextromethorphan  (ROBITUSSIN DM) 100-10 MG/5ML syrup Take 5 mLs by mouth every 4 (four) hours as needed for cough. 10/24/23  Yes Arlon Carliss ORN, DO  HUMALOG  MIX 75/25 KWIKPEN (75-25) 100 UNIT/ML KwikPen Inject 40 Units into the skin in the morning, at noon, and at bedtime.   Yes [provider]  ipratropium-albuterol  (DUONEB) 0.5-2.5 (3) MG/3ML SOLN Use 1 vial (3 mLs) by nebulization 4 (four) times daily. 10/24/23  Yes Arlon Carliss ORN, DO  metolazone (ZAROXOLYN) 2.5 MG tablet Take 2.5 mg by mouth daily as needed (weight gain > 5lbs).   Yes [provider]  metoprolol  succinate (TOPROL -XL) 50 MG 24 hr tablet Take 1 tablet (50 mg total) by mouth daily. 09/25/23  Yes Raford Riggs, MD  OXYGEN  Inhale 2 L into the lungs continuous.   Yes [provider]  predniSONE  (DELTASONE ) 10 MG tablet Take 4 tablets (40 mg total) by mouth daily for 3 days, THEN 3 tablets (30 mg total) daily for 3 days, THEN 2 tablets (20 mg total) daily for 3 days, THEN 1 tablet (10 mg total) daily for 3 days. 10/24/23 11/05/23 Yes Arlon Carliss ORN, DO     Critical care time: 45 minutes

## 2023-10-26 NOTE — ED Notes (Signed)
 CCMD called. Pt placed on monitor.

## 2023-10-26 NOTE — Progress Notes (Signed)
 Pharmacy Antibiotic Note  [name] is a 78 y.o. female admitted on 10/26/2023 with pneumonia.  Pharmacy has been consulted for vanc/cefepime  dosing.  Pt presented with short of breath. Recent discharge for PNA. Vanc/cefepime  ordered empirically.  Scr 1, lytes ok   Plan: Vanc 1.5g IV x1 then 750mg  IV q24>>AUC 434, scr 1 Cefepime  2g IV q12 MRSA PCR Levels as needed  Height: 5' 3 (160 cm) Weight: 94.2 kg (207 lb 10.8 oz) IBW/kg (Calculated) : 52.4  Temp (24hrs), Avg:98.3 F (36.8 C), Min:98 F (36.7 C), Max:98.4 F (36.9 C)  Recent Labs  Lab 10/21/23 1514 10/22/23 0229 10/23/23 0230 10/24/23 0237 10/26/23 1146 10/26/23 1321  WBC 11.1* 9.9 15.5* 15.9* 10.5  --   CREATININE 1.06* 1.16* 1.48* 1.47* 1.02*  --   LATICACIDVEN  --   --   --   --   --  2.0*    Estimated Creatinine Clearance: 50.4 mL/min (A) (by C-G formula based on SCr of 1.02 mg/dL (H)).    Allergies  Allergen Reactions   Amiodarone  Other (See Comments)     pulmonary fibrosis   Gabapentin  Swelling    edema    Antimicrobials this admission: 8/11 vanc>> 8/11 cefepime >>  Dose adjustments this admission:   Microbiology results: 8/11 blood>>  Sergio Batch, PharmD, [place], AAHIVP, CPP Infectious Disease Pharmacist 10/26/2023 7:40 PM

## 2023-10-27 ENCOUNTER — Inpatient Hospital Stay (HOSPITAL_COMMUNITY)

## 2023-10-27 DIAGNOSIS — I5032 Chronic diastolic (congestive) heart failure: Secondary | ICD-10-CM | POA: Diagnosis not present

## 2023-10-27 DIAGNOSIS — J849 Interstitial pulmonary disease, unspecified: Secondary | ICD-10-CM | POA: Diagnosis not present

## 2023-10-27 DIAGNOSIS — I5030 Unspecified diastolic (congestive) heart failure: Secondary | ICD-10-CM | POA: Diagnosis not present

## 2023-10-27 DIAGNOSIS — R569 Unspecified convulsions: Secondary | ICD-10-CM

## 2023-10-27 DIAGNOSIS — R4182 Altered mental status, unspecified: Secondary | ICD-10-CM | POA: Diagnosis not present

## 2023-10-27 DIAGNOSIS — J9601 Acute respiratory failure with hypoxia: Secondary | ICD-10-CM | POA: Diagnosis not present

## 2023-10-27 DIAGNOSIS — Z6836 Body mass index (BMI) 36.0-36.9, adult: Secondary | ICD-10-CM

## 2023-10-27 DIAGNOSIS — J9621 Acute and chronic respiratory failure with hypoxia: Secondary | ICD-10-CM | POA: Diagnosis not present

## 2023-10-27 DIAGNOSIS — E119 Type 2 diabetes mellitus without complications: Secondary | ICD-10-CM

## 2023-10-27 DIAGNOSIS — Z950 Presence of cardiac pacemaker: Secondary | ICD-10-CM | POA: Diagnosis not present

## 2023-10-27 DIAGNOSIS — I480 Paroxysmal atrial fibrillation: Secondary | ICD-10-CM

## 2023-10-27 DIAGNOSIS — Z951 Presence of aortocoronary bypass graft: Secondary | ICD-10-CM

## 2023-10-27 DIAGNOSIS — J9622 Acute and chronic respiratory failure with hypercapnia: Secondary | ICD-10-CM

## 2023-10-27 DIAGNOSIS — R400 Somnolence: Secondary | ICD-10-CM

## 2023-10-27 DIAGNOSIS — E6609 Other obesity due to excess calories: Secondary | ICD-10-CM

## 2023-10-27 DIAGNOSIS — E66812 Obesity, class 2: Secondary | ICD-10-CM

## 2023-10-27 LAB — POCT I-STAT 7, (LYTES, BLD GAS, ICA,H+H)
Acid-Base Excess: 3 mmol/L — ABNORMAL HIGH (ref 0.0–2.0)
Bicarbonate: 31.1 mmol/L — ABNORMAL HIGH (ref 20.0–28.0)
Calcium, Ion: 1.19 mmol/L (ref 1.15–1.40)
HCT: 34 % — ABNORMAL LOW (ref 36.0–46.0)
Hemoglobin: 11.6 g/dL — ABNORMAL LOW (ref 12.0–15.0)
O2 Saturation: 95 %
Patient temperature: 98.6
Potassium: 4.7 mmol/L (ref 3.5–5.1)
Sodium: 131 mmol/L — ABNORMAL LOW (ref 135–145)
TCO2: 33 mmol/L — ABNORMAL HIGH (ref 22–32)
pCO2 arterial: 66 mmHg (ref 32–48)
pH, Arterial: 7.281 — ABNORMAL LOW (ref 7.35–7.45)
pO2, Arterial: 90 mmHg (ref 83–108)

## 2023-10-27 LAB — CBC
HCT: 39.9 % (ref 36.0–46.0)
Hemoglobin: 11.6 g/dL — ABNORMAL LOW (ref 12.0–15.0)
MCH: 27.7 pg (ref 26.0–34.0)
MCHC: 29.1 g/dL — ABNORMAL LOW (ref 30.0–36.0)
MCV: 95.2 fL (ref 80.0–100.0)
Platelets: 304 K/uL (ref 150–400)
RBC: 4.19 MIL/uL (ref 3.87–5.11)
RDW: 15.3 % (ref 11.5–15.5)
WBC: 9.5 K/uL (ref 4.0–10.5)
nRBC: 0.2 % (ref 0.0–0.2)

## 2023-10-27 LAB — HEPATIC FUNCTION PANEL
ALT: 20 U/L (ref 0–44)
AST: 28 U/L (ref 15–41)
Albumin: 2.9 g/dL — ABNORMAL LOW (ref 3.5–5.0)
Alkaline Phosphatase: 53 U/L (ref 38–126)
Bilirubin, Direct: 0.1 mg/dL (ref 0.0–0.2)
Indirect Bilirubin: 0.7 mg/dL (ref 0.3–0.9)
Total Bilirubin: 0.8 mg/dL (ref 0.0–1.2)
Total Protein: 6.4 g/dL — ABNORMAL LOW (ref 6.5–8.1)

## 2023-10-27 LAB — BASIC METABOLIC PANEL WITH GFR
Anion gap: 7 (ref 5–15)
BUN: 40 mg/dL — ABNORMAL HIGH (ref 8–23)
CO2: 30 mmol/L (ref 22–32)
Calcium: 8.8 mg/dL — ABNORMAL LOW (ref 8.9–10.3)
Chloride: 99 mmol/L (ref 98–111)
Creatinine, Ser: 1.01 mg/dL — ABNORMAL HIGH (ref 0.44–1.00)
GFR, Estimated: 57 mL/min — ABNORMAL LOW (ref >60)
Glucose, Bld: 113 mg/dL — ABNORMAL HIGH (ref 70–99)
Potassium: 4.9 mmol/L (ref 3.5–5.1)
Sodium: 136 mmol/L (ref 135–145)

## 2023-10-27 LAB — GLUCOSE, CAPILLARY
Glucose-Capillary: 146 mg/dL — ABNORMAL HIGH (ref 70–99)
Glucose-Capillary: 137 mg/dL — ABNORMAL HIGH (ref 70–99)
Glucose-Capillary: 122 mg/dL — ABNORMAL HIGH (ref 70–99)

## 2023-10-27 LAB — BLOOD GAS, VENOUS
Acid-Base Excess: 2.9 mmol/L — ABNORMAL HIGH (ref 0.0–2.0)
Bicarbonate: 33.6 mmol/L — ABNORMAL HIGH (ref 20.0–28.0)
O2 Saturation: 93.4 %
Patient temperature: 36.9
pCO2, Ven: 84 mmHg (ref 44–60)
pH, Ven: 7.21 — ABNORMAL LOW (ref 7.25–7.43)
pO2, Ven: 60 mmHg — ABNORMAL HIGH (ref 32–45)

## 2023-10-27 LAB — AMMONIA: Ammonia: 33 umol/L (ref 9–35)

## 2023-10-27 LAB — HIV ANTIBODY (ROUTINE TESTING W REFLEX): HIV Screen 4th Generation wRfx: NONREACTIVE

## 2023-10-27 LAB — TSH: TSH: 1.136 u[IU]/mL (ref 0.350–4.500)

## 2023-10-27 LAB — VITAMIN B12: Vitamin B-12: 1384 pg/mL — ABNORMAL HIGH (ref 180–914)

## 2023-10-27 LAB — MRSA NEXT GEN BY PCR, NASAL: MRSA by PCR Next Gen: NOT DETECTED

## 2023-10-27 MED ORDER — IPRATROPIUM-ALBUTEROL 0.5-2.5 (3) MG/3ML IN SOLN
3.0000 mL | Freq: Two times a day (BID) | RESPIRATORY_TRACT | Status: DC
  Administered 2023-10-28 – 2023-11-07 (×23): 3 mL via RESPIRATORY_TRACT
  Filled 2023-10-27 (×21): qty 3

## 2023-10-27 MED ORDER — LACTULOSE 10 GM/15ML PO SOLN
20.0000 g | Freq: Two times a day (BID) | ORAL | Status: DC
  Administered 2023-10-28 – 2023-10-31 (×7): 20 g via ORAL
  Filled 2023-10-27 (×10): qty 30

## 2023-10-27 MED ORDER — PREDNISONE 10 MG PO TABS
10.0000 mg | ORAL_TABLET | Freq: Two times a day (BID) | ORAL | Status: DC
  Administered 2023-10-27 – 2023-11-07 (×27): 10 mg via ORAL
  Filled 2023-10-27 (×6): qty 1
  Filled 2023-10-27: qty 2
  Filled 2023-10-27 (×2): qty 1
  Filled 2023-10-27: qty 2
  Filled 2023-10-27 (×8): qty 1
  Filled 2023-10-27: qty 2
  Filled 2023-10-27 (×2): qty 1
  Filled 2023-10-27 (×2): qty 2

## 2023-10-27 NOTE — TOC Initial Note (Addendum)
 Transition of Care [facility]) - Initial/Assessment Note    Patient Details  Name: [name] MRN: [id number] Date of Birth: August 26, 1945  [facility] CM/SW Contact:    [name], LCSWA Phone Number: 10/27/2023, 1:51 PM  Clinical Narrative:                    CSW received consult for possible SNF placement at time of discharge.Due to patients current orientation CSW LVM for patients daughter [name]. CSW awaiting call back to discuss  PT recommendation of SNF placement at time of discharge for patient. CSW to continue to follow and assist with patients discharge planning needs.   Update- CSW spoke with patients daughter [name] at bedside regarding PT recommendation of SNF placement at time of discharge. Patients daughter informed CSW that PTA patient comes from home with spouse and her. Patients daughter [name] expressed understanding of PT recommendation and is unsure about dc plan.CSW answered all questions regarding SNF placement.  Patients daughter would like to discuss options for home first with CM before making final decision. Patients daughter reports that patient was set up with home health services PTA but that never got started because she came back into the hospital. CSW informed CM [name].  No further questions reported at this time. CSW to continue to follow and assist with discharge planning needs.   Update- CSW spoke with [name] and [name] is agreeable for CSW to fax patient out for SNF to see what SNF bed offers she receives. [name] would still like to discuss home options for patient and is hoping that patient will be able to return home. [name] is going to discuss patients dc plan with family. Patients dc plan TBD. CSW and CM to follow up with patients daughter tomorrow. CSW faxed patients initial referral out for SNF. Bed offers currently pending.     Patient Goals and CMS Choice            Expected Discharge Plan and Services                                               Prior Living Arrangements/Services                       Activities of Daily Living   ADL Screening (condition at time of admission) Independently performs ADLs?: No Does the patient have a NEW difficulty with bathing/dressing/toileting/self-feeding that is expected to last >3 days?: Yes (Initiates electronic notice to provider for possible OT consult) Does the patient have a NEW difficulty with getting in/out of bed, walking, or climbing stairs that is expected to last >3 days?: Yes (Initiates electronic notice to provider for possible PT consult) Does the patient have a NEW difficulty with communication that is expected to last >3 days?: No Is the patient deaf or have difficulty hearing?: Yes Does the patient have difficulty seeing, even when wearing glasses/contacts?: Yes Does the patient have difficulty concentrating, remembering, or making decisions?: Yes  Permission Sought/Granted                  Emotional Assessment              Admission diagnosis:  ILD (interstitial lung disease) (HCC) [J84.9] Healthcare-associated pneumonia [J18.9] Patient Active Problem List   Diagnosis Date Noted   Healthcare-associated pneumonia 10/26/2023  Acute exacerbation of idiopathic pulmonary fibrosis (HCC) 10/21/2023   Shortness of breath 10/21/2023   Leukocytosis 10/21/2023   Chronic hypoxic respiratory failure (HCC) 10/21/2023   Chronic diastolic CHF (congestive heart failure) (HCC) 10/21/2023   History of essential hypertension 10/21/2023   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/03/2020   Hypertension 05/03/2020   Atrial fibrillation with RVR (HCC) 12/08/2018   ILD (interstitial lung disease) (HCC) 09/23/2018   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   Restrictive lung disease 03/03/2018   Palpitations 06/07/2015   Pacemaker- March 2016  06/07/2015   Chronic anticoagulation 06/07/2015   Sleep apnea-unable to tolerate C-pap 06/07/2015   Nephrolithiasis  06/07/2015   Poorly controlled diabetes mellitus (HCC) 12/02/2014   Ureteral stone 09/08/2014   Preop cardiovascular exam 09/05/2014   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 06/05/2014   History of uterine cancer 06/04/2014   Personal history of transient ischemic attack (TIA) and cerebral infarction without residual deficit    Hypertensive heart disease with heart failure (HCC)    Hx of CABG 2005 09/28/2012   DM2 (diabetes mellitus, type 2) (HCC) 09/28/2012   Paroxysmal atrial fibrillation (HCC) 09/28/2012   Class 2 obesity due to excess calories with body mass index (BMI) of 36.0 to 36.9 in adult 09/28/2012   Mixed hyperlipidemia 09/28/2012   PCP:  Dana Hamilton, MD Pharmacy:   [facility] [place], [place] - [address] [place] [address] Phone: [phone] Fax: [phone]     Social Drivers of Health (SDOH) Social History: SDOH Screenings   Food Insecurity: No Food Insecurity (10/26/2023)  Housing: Low Risk  (10/26/2023)  Transportation Needs: No Transportation Needs (10/26/2023)  Utilities: Not At Risk (10/26/2023)  Financial Resource Strain: Low Risk  (12/09/2018)  Social Connections: Unknown (10/26/2023)  Tobacco Use: Low Risk  (10/26/2023)   SDOH Interventions:     Readmission Risk Interventions    10/22/2023    4:58 PM  Readmission Risk Prevention Plan  Medication Screening Complete  Transportation Screening Complete

## 2023-10-27 NOTE — Evaluation (Signed)
 Occupational Therapy Evaluation Patient Details Name: [name] MRN: [id number] DOB: 1945-08-06 Today's Date: 10/27/2023   History of Present Illness   Pt is a 78 y/o female presenting with AMS and progressive weakness. Recent admission 8/6-8/9 for ILD flare. PMH:  chronic resp failure on 2L, ILD, CHF, CAD s/p CABG, PAF, SSS s/p PPM, T2DM     Clinical Impressions PTA, pt lives with family, typically Modified Independence with ADLs/mobility using Rollator. Pt presents now with deficits in cardiopulmonary endurance, strength, cognition and coordination. Pt easily awakened, difficulty lifting BUE without support and difficulty holding items. Pt with confusion, slower responses and requiring overall Max-Total A for ADLs d/t deficits. Deferred EOB attempts without +2 assist this AM. Pt's son at bedside, endorses pt is significantly below baseline and that pt's daughter at home unable to provide physical assistance. Recommend continued inpatient follow up therapy, <3 hours/day upon DC if pt does not make good acute progress to DC home.      If plan is discharge home, recommend the following:   A lot of help with walking and/or transfers;Two people to help with walking and/or transfers;A lot of help with bathing/dressing/bathroom     Functional Status Assessment   Patient has had a recent decline in their functional status and demonstrates the ability to make significant improvements in function in a reasonable and predictable amount of time.     Equipment Recommendations   Other (comment) (TBD pending progress)     Recommendations for Other Services         Precautions/Restrictions   Precautions Precautions: Fall Recall of Precautions/Restrictions: Impaired Precaution/Restrictions Comments: monitor O2 (2-3 L O2 at baseline) Restrictions Weight Bearing Restrictions Per Provider Order: No     Mobility Bed Mobility               General bed mobility comments:  Deferred EOB without +2 assist due to lethargy, weakness and impaired cognition    Transfers                          Balance                                           ADL either performed or assessed with clinical judgement   ADL Overall ADL's : Needs assistance/impaired Eating/Feeding: Maximal assistance;Bed level Eating/Feeding Details (indicate cue type and reason): Son present, assisting with drinking from cup and eating ice chips. Pt unable to reach to face without UE support, frequent dropping and difficulty grasping items. Grooming: Maximal assistance;Bed level;Moderate assistance;Wash/dry face Grooming Details (indicate cue type and reason): assist provided under elbow, washcloth placed in hand w/ hand noted to drop Upper Body Bathing: Maximal assistance;Bed level   Lower Body Bathing: Total assistance;Bed level   Upper Body Dressing : Maximal assistance;Bed level   Lower Body Dressing: Total assistance;Bed level       Toileting- Clothing Manipulation and Hygiene: Total assistance;Bed level         General ADL Comments: Pt able to minimally move all extremities with UE appearing weaker than LE. Gravity minimized support needed under UE in order to reach to face for basic ADLs     Vision Ability to See in Adequate Light: 1 Impaired Patient Visual Report: No change from baseline Vision Assessment?: No apparent visual deficits     Perception  Praxis         Pertinent Vitals/Pain Pain Assessment Pain Assessment: No/denies pain     Extremity/Trunk Assessment Upper Extremity Assessment Upper Extremity Assessment: Generalized weakness;Right hand dominant;RUE deficits/detail;LUE deficits/detail RUE Deficits / Details: moving better than LUE though weakness at every joint. AAROM WFL, impaired coordination. mild edema RUE Coordination: decreased fine motor LUE Deficits / Details: requiring support under elbow to reach towards  face. weaker than RUE, edema in hand. with support of hand to face, UE noted to drop frequently LUE Coordination: decreased fine motor   Lower Extremity Assessment Lower Extremity Assessment: Defer to PT evaluation   Cervical / Trunk Assessment Cervical / Trunk Assessment: Kyphotic   Communication Communication Communication: Impaired Factors Affecting Communication: Hearing impaired   Cognition Arousal: Lethargic Behavior During Therapy: Flat affect Cognition: Cognition impaired   Orientation impairments: Situation Awareness: Intellectual awareness impaired, Online awareness impaired Memory impairment (select all impairments): Short-term memory, Working memory Attention impairment (select first level of impairment): Sustained attention Executive functioning impairment (select all impairments): Problem solving, Reasoning OT - Cognition Comments: lethargic but awakens easily to verbal stimuli. step by step squencing cues needed. Pt repeating same phrases at times Are you ok, [name]? to son in room, etc. With increased time, able to report being at [facility], report that it is morning time (but not sure what time). Initially reported being at hospital due to pain - later able to state, unable to lift arms.                 Following commands: Impaired Following commands impaired: Follows one step commands with increased time     Cueing  General Comments   Cueing Techniques: Verbal cues;Gestural cues;Tactile cues  Son, [name], present. Pt on 4 L O2 during session   Exercises     Shoulder Instructions      Home Living Family/patient expects to be discharged to:: Private residence Living Arrangements: Spouse/significant other;Children Available Help at Discharge: Family;Neighbor Type of Home: House Home Access: Ramped entrance     Home Layout: Two level Alternate Level Stairs-Number of Steps: Laundry in basement   Bathroom Shower/Tub: Producer, television/film/video:  Standard     Home Equipment: Educational psychologist (4 wheels);Wheelchair - manual;Other (comment) (adjustable bed)   Additional Comments: Daughter disabled w/ back fx- unable to provide lifting assistance      Prior Functioning/Environment Prior Level of Function : Independent/Modified Independent             Mobility Comments: Rollator for mobility ADLs Comments: Typically able to manage ADLs. Previously assisting husband who is in a wheelchair. Assisted for transportation, IADLs in the home    OT Problem List: Decreased strength;Decreased activity tolerance;Impaired balance (sitting and/or standing);Decreased cognition;Decreased safety awareness;Impaired UE functional use;Increased edema;Decreased coordination;Cardiopulmonary status limiting activity   OT Treatment/Interventions: Self-care/ADL training;Therapeutic exercise;Energy conservation;DME and/or AE instruction;Therapeutic activities;Patient/family education;Balance training      OT Goals(Current goals can be found in the care plan section)   Acute Rehab OT Goals Patient Stated Goal: Son hopeful for improvements, especially with cognition OT Goal Formulation: With patient/family Time For Goal Achievement: 11/10/23 Potential to Achieve Goals: Good ADL Goals Pt Will Perform Eating: with set-up;sitting;bed level Pt Will Perform Grooming: with set-up;sitting;bed level Pt Will Transfer to Toilet: with mod assist;with +2 assist;stand pivot transfer;bedside commode Additional ADL Goal #1: Pt to complete bed mobility with Min A in prep for EOB/OOB ADLs   OT Frequency:  Min 2X/week    Co-evaluation  AM-PAC OT 6 Clicks Daily Activity     Outcome Measure Help from another person eating meals?: A Lot Help from another person taking care of personal grooming?: A Lot Help from another person toileting, which includes using toliet, bedpan, or urinal?: Total Help from another person bathing (including  washing, rinsing, drying)?: A Lot Help from another person to put on and taking off regular upper body clothing?: A Lot Help from another person to put on and taking off regular lower body clothing?: Total 6 Click Score: 10   End of Session Equipment Utilized During Treatment: Oxygen  Nurse Communication: Mobility status  Activity Tolerance: Patient tolerated treatment well Patient left: in bed;with call bell/phone within reach;with family/visitor present  OT Visit Diagnosis: Unsteadiness on feet (R26.81);Other abnormalities of gait and mobility (R26.89);Muscle weakness (generalized) (M62.81);Other symptoms and signs involving cognitive function                Time: 9256-9189 OT Time Calculation (min): 27 min Charges:  OT General Charges $OT Visit: 1 Visit OT Evaluation $OT Eval Moderate Complexity: 1 Mod OT Treatments $Self Care/Home Management : 8-22 mins  Mliss NOVAK, OTR/L Acute Rehab Services Office: [phone]   Mliss Fish 10/27/2023, 8:37 AM

## 2023-10-27 NOTE — Evaluation (Signed)
 Clinical/Bedside Swallow Evaluation Patient Details  Name: [name] MRN: [id number] Date of Birth: 13-Sep-1945  Today's Date: 10/27/2023 Time: SLP Start Time (ACUTE ONLY): 9161 SLP Stop Time (ACUTE ONLY): 0850 SLP Time Calculation (min) (ACUTE ONLY): 12 min  Past Medical History:  Past Medical History:  Diagnosis Date   Asthmatic bronchitis    CHF (congestive heart failure) (HCC) dx'd 2016   Coronary atherosclerosis of native coronary artery    a. s/p CABG 2005 Dr Dusty     Hyperlipidemia    Hypertension    Hypertensive heart disease    Kidney stones    Mixed hyperlipidemia    Nephrolithiasis 06/07/2015   Paroxysmal atrial fibrillation (HCC)    a. s/p Maze in 2005 b. recurrence in March 2016 with >8sec posttermination pauses s/p PPM implant     Pneumonia 1950   double   Presence of permanent cardiac pacemaker    Restrictive lung disease 03/03/2018   Sleep apnea    tested; mask ordered; couldn't afford it; will get it now (09/04/2015)   TIA (transient ischemic attack)    Type II diabetes mellitus (HCC)    Uterine cancer [facility])    age 74 with partial hysterectomy   Past Surgical History:  Past Surgical History:  Procedure Laterality Date   AV NODE ABLATION N/A 03/28/2019   Procedure: AV NODE ABLATION;  Surgeon: Waddell Danelle ORN, MD;  Location: [facility] INVASIVE CV LAB;  Service: Cardiovascular;  Laterality: N/A;   CARDIAC CATHETERIZATION  2005   CORONARY ARTERY BYPASS GRAFT  2005   Dr Dusty with MAZE   CYSTOSCOPY WITH URETEROSCOPY, STONE BASKETRY AND STENT PLACEMENT Left 09/08/2014   Procedure: CYSTOSCOPY WITH URETEROSCOPY, STONE BASKETRY AND STENT PLACEMENT;  Surgeon: Belvie LITTIE Clara, MD;  Location: [facility] ORS;  Service: Urology;  Laterality: Left;   CYSTOSCOPY/URETEROSCOPY/HOLMIUM LASER/STENT PLACEMENT Left 09/15/2014   Procedure: CYSTOSCOPY/RETROGRADE/URETEROSCOPY/HOLMIUM LASER/ STONE EXTRACTION /STENT EXCHANGE;  Surgeon: Belvie LITTIE Clara, MD;  Location: [facility] ORS;  Service:  Urology;  Laterality: Left;   HOLMIUM LASER APPLICATION Left 09/08/2014   Procedure: HOLMIUM LASER APPLICATION;  Surgeon: Belvie LITTIE Clara, MD;  Location: [facility] ORS;  Service: Urology;  Laterality: Left;   HOLMIUM LASER APPLICATION Left 09/15/2014   Procedure: HOLMIUM LASER APPLICATION;  Surgeon: Belvie LITTIE Clara, MD;  Location: [facility] ORS;  Service: Urology;  Laterality: Left;   INSERT / REPLACE / REMOVE PACEMAKER     LAPAROSCOPIC CHOLECYSTECTOMY     PERMANENT PACEMAKER INSERTION N/A 06/05/2014   MDT Advisa dual chamber 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   PPM GENERATOR CHANGEOUT N/A 05/27/2023   Procedure: PPM GENERATOR CHANGEOUT;  Surgeon: Waddell Danelle ORN, MD;  Location: [facility] INVASIVE CV LAB;  Service: Cardiovascular;  Laterality: N/A;   RIGHT HEART CATH N/A 02/26/2022   Procedure: RIGHT HEART CATH;  Surgeon: Gardenia Led, DO;  Location: [facility] INVASIVE CV LAB;  Service: Cardiovascular;  Laterality: N/A;   VAGINAL HYSTERECTOMY  1971   HPI:  78 year old female patient with history of chronic hypoxic respiratory failure secondary to ILD, HFpEF, CAD, diabetes recent hospitalization for acute exacerbation of pulmonary fibrosis given supportive measures, steroids, antibiotics, bronchodilators discharged on Augmentin  and azithromycin  with steroid taper. Patient presenting with weakness, cough, more confusion per the family. Diagnosed with pna. In the ER, patient was persistently short of breath, chest film revealed left-sided airspace opacities.    Assessment / Plan / Recommendation  Clinical Impression  Pt awake however appears deconditioned with scattered missing dentition with poor dental  hygiene and appropriate oro-motor abilities. She reported sometimes coughing with water. Across consistencies she had one delayed throat clear and frequent eructation questioning any esophageal involvement. She was able to masticate solid however given her suspected decreased reserve will downgrade  texture to Dys 3, continue thin, pills with thin (one at a time) and encouragement during meals. ST will continue to follow to ensure safety with diet and need for instrumental study. She currently has pneumonia, had pneumonitis 2 years ago and history of lung disease. SLP Visit Diagnosis: Dysphagia, unspecified (R13.10)    Aspiration Risk  Mild aspiration risk    Diet Recommendation Dysphagia 3 (Mech soft);Thin liquid    Liquid Administration via: Straw Medication Administration: Whole meds with liquid Supervision: Patient able to self feed (may need assist) Compensations: Slow rate;Small sips/bites Postural Changes: Seated upright at 90 degrees    Other  Recommendations Oral Care Recommendations: Oral care BID     Assistance Recommended at Discharge    Functional Status Assessment Patient has had a recent decline in their functional status and demonstrates the ability to make significant improvements in function in a reasonable and predictable amount of time.  Frequency and Duration min 2x/week  2 weeks       Prognosis Prognosis for improved oropharyngeal function: Good      Swallow Study   General Date of Onset: 10/27/23 HPI: 78 year old female patient with history of chronic hypoxic respiratory failure secondary to ILD, HFpEF, CAD, diabetes recent hospitalization for acute exacerbation of pulmonary fibrosis given supportive measures, steroids, antibiotics, bronchodilators discharged on Augmentin  and azithromycin  with steroid taper. Patient presenting with weakness, cough, more confusion per the family. Diagnosed with pna. In the ER, patient was persistently short of breath, chest film revealed left-sided airspace opacities. Type of Study: Bedside Swallow Evaluation Previous Swallow Assessment:  (none) Diet Prior to this Study: Regular;Thin liquids (Level 0) Temperature Spikes Noted: No Respiratory Status: Room air History of Recent Intubation: No Behavior/Cognition:  Alert;Cooperative;Pleasant mood;Confused;Requires cueing Oral Cavity Assessment: Within Functional Limits Oral Care Completed by SLP: No Oral Cavity - Dentition: Missing dentition;Poor condition (missing scattered upper and lower) Vision: Functional for self-feeding Self-Feeding Abilities: Needs assist Patient Positioning: Upright in bed Baseline Vocal Quality: Normal Volitional Cough: Other (Comment) (moderately strong)    Oral/Motor/Sensory Function Overall Oral Motor/Sensory Function: Within functional limits   Ice Chips Ice chips: Not tested   Thin Liquid Thin Liquid: Impaired Presentation: Cup;Straw Oral Phase Impairments:  (none) Pharyngeal  Phase Impairments: Throat Clearing - Delayed (x 1)    Nectar Thick Nectar Thick Liquid: Not tested   Honey Thick Honey Thick Liquid: Not tested   Puree Puree: Within functional limits   Solid     Solid:  (functional but easily fatigues)      Dustin, Olam Bull 10/27/2023,9:15 AM

## 2023-10-27 NOTE — NC FL2 (Signed)
 [place]  MEDICAID FL2 LEVEL OF CARE FORM     IDENTIFICATION  Patient Name: [name] Birthdate: [birth year] Sex: female Admission Date (Current Location): 10/26/2023  [place] [facility] and [place] Number:  Producer, television/film/video and Address:  [facility] [place], [address] [place], [place] [address]      Provider Number: [id number]  Attending Physician Name and Address:  Sherrill Alejandro Donovan, DO  Relative Name and Phone Number:  Liyat Faulkenberry (daughter) [phone]    Current Level of Care: Hospital Recommended Level of Care: Skilled Nursing Facility Prior Approval Number:    Date Approved/Denied:   PASRR Number: [id number]  Discharge Plan: SNF    Current Diagnoses: Patient Active Problem List   Diagnosis Date Noted   Healthcare-associated pneumonia 10/26/2023   Acute exacerbation of idiopathic pulmonary fibrosis (HCC) 10/21/2023   Shortness of breath 10/21/2023   Leukocytosis 10/21/2023   Chronic hypoxic respiratory failure (HCC) 10/21/2023   Chronic diastolic CHF (congestive heart failure) (HCC) 10/21/2023   History of essential hypertension 10/21/2023   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/03/2020   Hypertension 05/03/2020   Atrial fibrillation with RVR (HCC) 12/08/2018   ILD (interstitial lung disease) (HCC) 09/23/2018   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   Restrictive lung disease 03/03/2018   Palpitations 06/07/2015   Pacemaker- March 2016  06/07/2015   Chronic anticoagulation 06/07/2015   Sleep apnea-unable to tolerate C-pap 06/07/2015   Nephrolithiasis 06/07/2015   Poorly controlled diabetes mellitus (HCC) 12/02/2014   Ureteral stone 09/08/2014   Preop cardiovascular exam 09/05/2014   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 06/05/2014   History of uterine cancer 06/04/2014   Personal history of transient ischemic attack (TIA) and cerebral infarction without residual deficit    Hypertensive heart disease with heart failure (HCC)    Hx  of CABG 2005 09/28/2012   DM2 (diabetes mellitus, type 2) (HCC) 09/28/2012   Paroxysmal atrial fibrillation (HCC) 09/28/2012   Class 2 obesity due to excess calories with body mass index (BMI) of 36.0 to 36.9 in adult 09/28/2012   Mixed hyperlipidemia 09/28/2012    Orientation RESPIRATION BLADDER Height & Weight     Self, Place  O2 (Nasal cannula 4 liters) Incontinent Weight: 207 lb 10.8 oz (94.2 kg) Height:  5' 3 (160 cm)  BEHAVIORAL SYMPTOMS/MOOD NEUROLOGICAL BOWEL NUTRITION STATUS      Continent Diet (Please see discharge summary)  AMBULATORY STATUS COMMUNICATION OF NEEDS Skin   Extensive Assist Verbally  (Ecchymosis,arm,Bil.,Erythema,Buttocks,Perineum,Bil.,Wound/Incision LDAs)                       Personal Care Assistance Level of Assistance  Bathing, Feeding, Dressing Bathing Assistance: Maximum assistance Feeding assistance: Maximum assistance Dressing Assistance: Maximum assistance     Functional Limitations Info  Hearing, Speech   Hearing Info: Impaired Speech Info: Adequate    SPECIAL CARE FACTORS FREQUENCY  PT (By licensed PT), OT (By licensed OT)     PT Frequency: 5x min weekly OT Frequency: 5x min weekly            Contractures Contractures Info: Not present    Additional Factors Info  Code Status, Allergies, Insulin  Sliding Scale Code Status Info: DNR Allergies Info: Amiodarone ,Gabapentin    Insulin  Sliding Scale Info: insulin  aspart (novoLOG ) injection 0-20 Units 3 times daily with meals,insulin  aspart (novoLOG ) injection 0-5 Units daily at bedtime       Current Medications (10/27/2023):  This is the current hospital active medication list Current Facility-Administered  Medications  Medication Dose Route Frequency Provider Last Rate Last Admin   acetaminophen  (TYLENOL ) tablet 650 mg  650 mg Oral Q6H PRN Barbarann Nest, MD   650 mg at 10/27/23 1628   Or   acetaminophen  (TYLENOL ) suppository 650 mg  650 mg Rectal Q6H PRN Barbarann Nest, MD        albuterol  (PROVENTIL ) (2.5 MG/3ML) 0.083% nebulizer solution 2.5 mg  2.5 mg Nebulization Q2H PRN Barbarann Nest, MD       apixaban  (ELIQUIS ) tablet 5 mg  5 mg Oral BID Barbarann Nest, MD   5 mg at 10/27/23 9075   benzonatate  (TESSALON ) capsule 100 mg  100 mg Oral BID Barbarann Nest, MD   100 mg at 10/27/23 0925   bisacodyl  (DULCOLAX) EC tablet 5 mg  5 mg Oral Daily PRN Barbarann Nest, MD       ceFEPIme  (MAXIPIME ) 2 g in sodium chloride  0.9 % 100 mL IVPB  2 g Intravenous Q12H Pham, Minh Q, RPH-CPP   Stopped at 10/27/23 1356   docusate sodium  (COLACE) capsule 100 mg  100 mg Oral BID Barbarann Nest, MD   100 mg at 10/27/23 9075   guaiFENesin -dextromethorphan  (ROBITUSSIN DM) 100-10 MG/5ML syrup 5 mL  5 mL Oral Q4H PRN Barbarann Nest, MD       hydrALAZINE  (APRESOLINE ) injection 5 mg  5 mg Intravenous Q4H PRN Barbarann Nest, MD       insulin  aspart (novoLOG ) injection 0-20 Units  0-20 Units Subcutaneous TID WC Barbarann Nest, MD   3 Units at 10/27/23 1628   insulin  aspart (novoLOG ) injection 0-5 Units  0-5 Units Subcutaneous QHS Barbarann Nest, MD       [START ON 10/28/2023] ipratropium-albuterol  (DUONEB) 0.5-2.5 (3) MG/3ML nebulizer solution 3 mL  3 mL Nebulization BID Sheikh, Omair Latif, DO       lactulose  (CHRONULAC ) 10 GM/15ML solution 20 g  20 g Oral BID Sheikh, Omair Latif, DO       metoprolol  succinate (TOPROL -XL) 24 hr tablet 50 mg  50 mg Oral Daily Barbarann Nest, MD   50 mg at 10/27/23 9075   ondansetron  (ZOFRAN ) tablet 4 mg  4 mg Oral Q6H PRN Barbarann Nest, MD       Or   ondansetron  (ZOFRAN ) injection 4 mg  4 mg Intravenous Q6H PRN Barbarann Nest, MD       polyethylene glycol (MIRALAX  / GLYCOLAX ) packet 17 g  17 g Oral Daily PRN Barbarann Nest, MD       predniSONE  (DELTASONE ) tablet 10 mg  10 mg Oral BID WC Olalere, Adewale A, MD   10 mg at 10/27/23 1628   sodium chloride  flush (NS) 0.9 % injection 3 mL  3 mL Intravenous Q12H Yates, Jennifer, MD   3 mL at 10/27/23 9073      Discharge Medications: Please see discharge summary for a list of discharge medications.  Relevant Imaging Results:  Relevant Lab Results:   Additional Information [id number]  Isaiah Public, LCSWA

## 2023-10-27 NOTE — Progress Notes (Addendum)
 NAME:  JAIDEN WAHAB, MRN:  [id number], DOB:  [birth year], LOS: 1 ADMISSION DATE:  10/26/2023, CONSULTATION DATE:  10/26/23 REFERRING MD:  Dr Lanie Herald, CHIEF COMPLAINT:  AMS/ Weakness   History of Present Illness:  78 year old female patient with history of chronic hypoxic respiratory failure secondary to ILD, HFpEF, CAD, diabetes recent hospitalization for acute exacerbation of pulmonary fibrosis given supportive measures, steroids, antibiotics, bronchodilators discharged on Augmentin  and azithromycin  with steroid taper. Patient presenting with weakness, cough, more confusion per the family. In the ER, patient was persistently short of breath, chest film revealed left-sided airspace opacities.  Station started on antibiotics started on steroids.  Pulmonary asked to see this patient for evaluation for underlying ILD and ongoing comorbidities.  Pertinent  Medical History   Past Medical History:  Diagnosis Date   Asthmatic bronchitis    CHF (congestive heart failure) (HCC) dx'd 2016   Coronary atherosclerosis of native coronary artery    a. s/p CABG 2005 Dr Dusty     Hyperlipidemia    Hypertension    Hypertensive heart disease    Kidney stones    Mixed hyperlipidemia    Nephrolithiasis 06/07/2015   Paroxysmal atrial fibrillation (HCC)    a. s/p Maze in 2005 b. recurrence in March 2016 with >8sec posttermination pauses s/p PPM implant     Pneumonia 1950   double   Presence of permanent cardiac pacemaker    Restrictive lung disease 03/03/2018   Sleep apnea    tested; mask ordered; couldn't afford it; will get it now (09/04/2015)   TIA (transient ischemic attack)    Type II diabetes mellitus (HCC)    Uterine cancer [facility])    age 14 with partial hysterectomy   Significant Hospital Events: Including procedures, antibiotic start and stop dates in addition to other pertinent events   CT chest 10/21/2023-interstitial lung disease, groundglass changes  Interim History / Subjective:   Admitted for shortness of breath, interstitial lung disease, altered mental status  Objective    Blood pressure (!) 148/79, pulse 60, temperature (!) 97.5 F (36.4 C), temperature source Oral, resp. rate 20, height 5' 3 (1.6 m), weight 94.2 kg, SpO2 97%.        Intake/Output Summary (Last 24 hours) at 10/27/2023 0820 Last data filed at 10/27/2023 0300 Gross per 24 hour  Intake 440.93 ml  Output --  Net 440.93 ml   Filed Weights   10/26/23 1135  Weight: 94.2 kg    Examination: General: Chronically ill-appearing, lethargy HENT: Moist oral mucosa Lungs: Decreased air movement bilaterally due to poor effort, did not appreciate rales Cardiovascular: S1-S2 appreciated Abdomen: Soft, bowel sounds appreciated Extremities: No clubbing, no edema Neuro: Arousable, moving all extremities GU:   Resolved problem list   Assessment and Plan  Interstitial lung disease - Recent hospitalization 8/6-9 -Responded well to steroids, antibiotics -Discharged on p.o. Augmentin  and azithromycin  - Tapering dose of steroids  Recent pneumonia - Cough productive of yellow-green sputum - Procalcitonin less than 0.1 - MRSA PCR negative - Follow cultures - Will suggest de-escalating antibiotics if no specific organism  Consideration that steroids may also be contributing to mental status changes She was discharged on a tapering dose of 40 for 3 days, 30 for 3 days then 20 for 3 days  - Discontinue Solu-Medrol  - Resume 10 mg twice daily for 3 days and taper from there  Recent VBG 10/22/2023 with CO2 retention - Will recheck VBG - If elevated pCO2, may be tried on BiPAP-15/5 with  follow-up VBG  Chronic hypoxic respiratory failure on 2 L of oxygen  -Was recently discharged on 4 L -Wean down FiO2 to maintain saturations greater than 89%  Heart failure with preserved ejection fraction  Altered mental status  - Likely multifactorial  Coronary artery disease s/p CABG in 2005  Paroxysmal  atrial fibrillation - On Toprol   Diabetes - On insulin , - Moderate SSI  HFpEF - Appears euvolemic - Diuretics on hold   She is DNR status  Jennet Epley, MD [place] P[facility]M Pager: See Tracey

## 2023-10-27 NOTE — Progress Notes (Signed)
 EEG complete - results pending

## 2023-10-27 NOTE — TOC Progression Note (Signed)
 Transition of Care [facility]) - Progression Note    Patient Details  Name: [name] MRN: [id number] Date of Birth: 02-12-1946  [facility] CM/SW Contact  Rosaline JONELLE Joe, RN Phone Number: 10/27/2023, 2:54 PM  Clinical Narrative:    CM with IP Care management spoke with Isaiah HUGHS and the daughter requested to speak with CM regarding home health versus SNF placement.  Patient was set up with [facility] prior to re-admission.  I called and left a detailed voicemail with the patient's daughter by phone.                     Expected Discharge Plan and Services                                               Social Drivers of Health (SDOH) Interventions SDOH Screenings   Food Insecurity: No Food Insecurity (10/26/2023)  Housing: Low Risk  (10/26/2023)  Transportation Needs: No Transportation Needs (10/26/2023)  Utilities: Not At Risk (10/26/2023)  Financial Resource Strain: Low Risk  (12/09/2018)  Social Connections: Unknown (10/26/2023)  Tobacco Use: Low Risk  (10/26/2023)    Readmission Risk Interventions    10/22/2023    4:58 PM  Readmission Risk Prevention Plan  Medication Screening Complete  Transportation Screening Complete

## 2023-10-27 NOTE — Hospital Course (Addendum)
 [name] is a 78 y.o. 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  She was last hospitalized from 8/6-9 with an acute exacerbation of pulmonary fibrosis and responded well to IV Solumedrol, empiric antibiotics, nebulizers, supplemental O2. She was discharged on PO Augmentin  + Azithromycin  and a prednisone  taper.  She is currently on 4L home O2.   Presents with confusion and worsening somnolence.  Found to be hypercarbic and transferred to the progressive care unit and placed on BiPAP.  Pulmonary consulted and changed her back to oral prednisone .  Working her up for encephalopathy otherwise as below.  PT/OT recommending SNF now.  Assessment and Plan:  Somnolence and Drowsiness: Check ABG and she was hypercarbic so she was transferred to the progressive care unit placed on BiPAP.  Pulmonary following and will need to continue nebs per their recommendations.  May need an NIV for home if she continues to be hypercarbic.  ABG done and showed a pH of 7.281.  PCO2 of 66, PO2 of 90, and O2 saturation 95%.  Will need to continue monitor respiratory status carefully and will need to be weaned back to off supplemental oxygen  nasal cannula  Acute Encephalopathy: Likely in the the setting of hypercarbia but will need to rule out other etiologies.  TSH was 0.126, vitamin B12 1384.  RPR pending and HIV is nonreactive.  Checked EEG and showed that the study was suggestive of moderate to severe diffuse encephalopathy with no seizures or epileptiform discharges seen throughout the recording. Ordered an MRI but patient has a [employer] pacer so we will see if this is compatible.  Continue to monitor carefully and will need delirium precautions.  Checking blood cultures x 2 and pending and UA was only showing a small amount of hemoglobin but a large amount of glucose but the  rest was relatively unremarkable.  PT OT recommending SNF and SLP evaluated and referred recommending dysphagia 3 mechanical soft with thin liquid diet.  Ammonia level was slightly elevated 40 so we will recheck again and start lactulose  20 g p.o. twice daily  ILD Flare: Patient with recent hospitalization for ILD flare. She was medically stable at the time of discharge  but on the day after dc, however, she was altered with inappropriate behaviors andShe did have a mildly increased O2 requirement, as well. Repeat imaging today shows interval increase in B patchy airspace opacities, L>R. She was given Cefepime  and Vanc in the ER, and this is was being continued (had been on Augmentin  and Azithromycin  at home).  Given her procalcitonin being negative and MRSA PCR negative we will discontinue antibiotics and monitor Discharged on steroid taper but was changed back to back to Solumedrol with BID dosing.  This is now stopped and pulmonary is placed her back on 10 mg.  Pulmonary consulted and following   Chronic HFpEF: Appears compensated. Hold furosemide  for now.  Needs strict I's and O's and daily weights   CAD S/p CABG: No c/o CP   Afib: Has pacemaker and Rate controlled with Toprol  XL. 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 Jold Farxiga. Continue 75/25 and Cover with moderate-scale SSI. Carb modified diet.  Continue monitor CBGs per protocol and current Trend:  Recent Labs  Lab 10/24/23 0631 10/24/23 1136 10/26/23 1504 10/26/23 1748 10/26/23  2220 10/27/23 0810 10/27/23 1554  GLUCAP 184* 190* 81 79 83 146* 137*   Normocytic Anemia: Hgb/Hct Trend:  Recent Labs  Lab 10/21/23 1514 10/22/23 0229 10/23/23 0230 10/24/23 0237 10/26/23 1146 10/27/23 0251 10/27/23 1106  HGB 12.9 11.6* 11.6* 11.6* 11.9* 11.6* 11.6*  HCT 41.4 37.1 37.3 38.1 38.9 39.9 34.0*  MCV 91.2 89.6 90.8 92.3 92.2 95.2  --   -Check Anemia Panel in  the AM. CTM for S/Sx of Bleeding; No overt bleeding noted. Repeat CBC in the AM  Hypoalbuminemia: Patient's Albumin Lvl Trend went from 4.1 -> 2.9 -> 3.1. CTM and Trend and Repeat CMP in the AM  Class II Obesity: Complicates overall prognosis and care. Estimated body mass index is 36.79 kg/m as calculated from the following:   Height as of this encounter: 5' 3 (1.6 m).   Weight as of this encounter: 94.2 kg. Weight Loss and Dietary Counseling given

## 2023-10-27 NOTE — Progress Notes (Signed)
 PROGRESS NOTE    [name]  FMW:[id number] DOB: [birth year] DOA: 10/26/2023 PCP: Larnell Hamilton, MD   Brief Narrative:  [name] is a 78 y.o. 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  She was last hospitalized from 8/6-9 with an acute exacerbation of pulmonary fibrosis and responded well to IV Solumedrol, empiric antibiotics, nebulizers, supplemental O2. She was discharged on PO Augmentin  + Azithromycin  and a prednisone  taper.  She is currently on 4L home O2.   Presents with confusion and worsening somnolence.  Found to be hypercarbic and transferred to the progressive care unit and placed on BiPAP.  Pulmonary consulted and changed her back to oral prednisone .  Working her up for encephalopathy otherwise as below.  PT/OT recommending SNF now.  Assessment and Plan:  Somnolence and Drowsiness: Check ABG and she was hypercarbic so she was transferred to the progressive care unit placed on BiPAP.  Pulmonary following and will need to continue nebs per their recommendations.  May need an NIV for home if she continues to be hypercarbic.  ABG done and showed a pH of 7.281.  PCO2 of 66, PO2 of 90, and O2 saturation 95%.  Will need to continue monitor respiratory status carefully and will need to be weaned back to off supplemental oxygen  nasal cannula  Acute Encephalopathy: Likely in the the setting of hypercarbia but will need to rule out other etiologies.  TSH was 0.126, vitamin B12 1384.  RPR pending and HIV is nonreactive.  Checked EEG and showed that the study was suggestive of moderate to severe diffuse encephalopathy with no seizures or epileptiform discharges seen throughout the recording. Ordered an MRI but patient has a [employer] pacer so we will see if this is compatible.  Continue to monitor carefully and will need delirium  precautions.  Checking blood cultures x 2 and pending and UA was only showing a small amount of hemoglobin but a large amount of glucose but the rest was relatively unremarkable.  PT OT recommending SNF and SLP evaluated and referred recommending dysphagia 3 mechanical soft with thin liquid diet  ILD Flare: Patient with recent hospitalization for ILD flare. She was medically stable at the time of discharge  but on the day after dc, however, she was altered with inappropriate behaviors andShe did have a mildly increased O2 requirement, as well. Repeat imaging today shows interval increase in B patchy airspace opacities, L>R. She was given Cefepime  and Vanc in the ER, will continue (had been on Augmentin  and Azithromycin  at home) Discharged on steroid taper but was changed back to back to Solumedrol with BID dosing.  This is now stopped and pulmonary is placed her back on 10 mg.  Pulmonary consulted and following   Chronic HFpEF: Appears compensated. Hold furosemide  for now.  Needs strict I's and O's and daily weights   CAD S/p CABG: No c/o CP   Afib: Has pacemaker and Rate controlled with Toprol  XL. 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 Jold Farxiga. Continue 75/25 and Cover with moderate-scale SSI. Carb modified diet.  Continue monitor CBGs per protocol and current Trend:  Recent Labs  Lab 10/24/23 0631 10/24/23 1136 10/26/23 1504 10/26/23 1748 10/26/23 2220 10/27/23 0810 10/27/23 1554  GLUCAP 184* 190* 81 79 83 146* 137*   Normocytic  Anemia: Hgb/Hct Trend:  Recent Labs  Lab 10/21/23 1514 10/22/23 0229 10/23/23 0230 10/24/23 0237 10/26/23 1146 10/27/23 0251 10/27/23 1106  HGB 12.9 11.6* 11.6* 11.6* 11.9* 11.6* 11.6*  HCT 41.4 37.1 37.3 38.1 38.9 39.9 34.0*  MCV 91.2 89.6 90.8 92.3 92.2 95.2  --   -Check Anemia Panel in the AM. CTM for S/Sx of Bleeding; No overt bleeding noted. Repeat CBC in the  AM  Hypoalbuminemia: Patient's Albumin Lvl Trend went from 4.1 -> 2.9 -> 3.1. CTM and Trend and Repeat CMP in the AM  Class II Obesity: Complicates overall prognosis and care. Estimated body mass index is 36.79 kg/m as calculated from the following:   Height as of this encounter: 5' 3 (1.6 m).   Weight as of this encounter: 94.2 kg. Weight Loss and Dietary Counseling given   DVT prophylaxis:  apixaban  (ELIQUIS ) tablet 5 mg    Code Status: Limited: Do not attempt resuscitation (DNR) -DNR-LIMITED -Do Not Intubate/DNI  Family Communication: Discussed with daughter currently at bedside  Disposition Plan:  Level of care: Progressive Status is: Inpatient Remains inpatient appropriate because: Was somnolent and drowsy and had to be placed on BiPAP and transferred to the progressive care unit   Consultants:  Pulmonary  Procedures:  Has been needed as above  Antimicrobials:  Anti-infectives (From admission, onward)    Start     Dose/Rate Route Frequency Ordered Stop   10/27/23 1000  vancomycin  (VANCOREADY) IVPB 750 mg/150 mL  Status:  Discontinued        750 mg 150 mL/hr over 60 Minutes Intravenous Every 24 hours 10/26/23 1943 10/27/23 1324   10/27/23 0200  ceFEPIme  (MAXIPIME ) 2 g in sodium chloride  0.9 % 100 mL IVPB        2 g 200 mL/hr over 30 Minutes Intravenous Every 12 hours 10/26/23 1943     10/26/23 1430  vancomycin  (VANCOCIN ) IVPB 1000 mg/200 mL premix  Status:  Discontinued        1,000 mg 200 mL/hr over 60 Minutes Intravenous  Once 10/26/23 1421 10/26/23 1424   10/26/23 1430  ceFEPIme  (MAXIPIME ) 2 g in sodium chloride  0.9 % 100 mL IVPB        2 g 200 mL/hr over 30 Minutes Intravenous  Once 10/26/23 1421 10/26/23 1504   10/26/23 1430  vancomycin  (VANCOREADY) IVPB 1500 mg/300 mL        1,500 mg 150 mL/hr over 120 Minutes Intravenous  Once 10/26/23 1424 10/26/23 1750       Subjective: Seen and examined at bedside and she is very inappropriate and very somnolent and  drowsy.  ABG done and showed that she was hypercarbic and so she was transitioned to the progressive care unit and placed on BiPAP.  Daughter states that she has been appropriate and has been very confused since.  No lightheadedness or dizziness.  No other concerns or complaints at this time.  Objective: Vitals:   10/27/23 0920 10/27/23 1145 10/27/23 1253 10/27/23 1655  BP:  (!) 158/70 (!) 164/69 (!) 141/59  Pulse:   69 60  Resp:  20 (!) 23 18  Temp: 97.8 F (36.6 C) 98.5 F (36.9 C) 98.9 F (37.2 C)   TempSrc: Oral Axillary Axillary   SpO2:  97% 98% 100%  Weight:      Height:        Intake/Output Summary (Last 24 hours) at 10/27/2023 1808 Last data filed at 10/27/2023 1635 Gross per 24 hour  Intake 590.94 ml  Output --  Net 590.94 ml   Filed Weights   10/26/23 1135  Weight: 94.2 kg   Examination: Physical Exam:  Constitutional: WN/WD obese chronically ill-appearing Caucasian female no acute distress Respiratory: Diminished to auscultation bilaterally with some coarse breath sounds, no wheezing, rales, rhonchi or crackles. Normal respiratory effort and patient is not tachypenic. No accessory muscle use.  Unlabored breathing Cardiovascular: RRR, no murmurs / rubs / gallops. S1 and S2 auscultated.  Extremity edema Abdomen: Soft, non-tender, distended secondary to body habitus bowel sounds positive.  GU: Deferred. Musculoskeletal: No clubbing / cyanosis of digits/nails. No joint deformity upper and lower extremities.  Skin: No rashes, lesions, ulcers on limited skin evaluation. No induration; Warm and dry.  Neurologic: She appears very somnolent and drowsy and is not answering questions appropriately.  Moves extremities independently but does have some slight tremors and jerking Psychiatric: Impaired judgment insight.  Data Reviewed: I have personally reviewed following labs and imaging studies  CBC: Recent Labs  Lab 10/22/23 0229 10/23/23 0230 10/24/23 0237 10/26/23 1146  10/27/23 0251 10/27/23 1106  WBC 9.9 15.5* 15.9* 10.5 9.5  --   NEUTROABS 9.1*  --   --  7.6  --   --   HGB 11.6* 11.6* 11.6* 11.9* 11.6* 11.6*  HCT 37.1 37.3 38.1 38.9 39.9 34.0*  MCV 89.6 90.8 92.3 92.2 95.2  --   PLT 289 345 346 316 304  --    Basic Metabolic Panel: Recent Labs  Lab 10/21/23 2106 10/22/23 0229 10/23/23 0230 10/24/23 0237 10/26/23 1146 10/27/23 0251 10/27/23 1106  NA  --  137 138 138 140 136 131*  K  --  3.7 3.6 4.0 4.5 4.9 4.7  CL  --  97* 97* 97* 99 99  --   CO2  --  28 30 31 27 30   --   GLUCOSE  --  210* 289* 234* 75 113*  --   BUN  --  22 37* 48* 39* 40*  --   CREATININE  --  1.16* 1.48* 1.47* 1.02* 1.01*  --   CALCIUM   --  8.9 9.5 9.4 9.1 8.8*  --   MG 1.8 2.0 2.1  --   --   --   --   PHOS  --  4.8*  --   --   --   --   --    GFR: Estimated Creatinine Clearance: 50.9 mL/min (A) (by C-G formula based on SCr of 1.01 mg/dL (H)). Liver Function Tests: Recent Labs  Lab 10/22/23 0229 10/26/23 1146 10/27/23 1131  AST 23 26 28   ALT 14 16 20   ALKPHOS 63 51 53  BILITOT 0.8 0.6 0.8  PROT 7.2 6.8 6.4*  ALBUMIN 2.9* 3.1* 2.9*   No results for input(s): LIPASE, AMYLASE in the last 168 hours. Recent Labs  Lab 10/26/23 2153 10/27/23 1131  AMMONIA 40* 33   Coagulation Profile: No results for input(s): INR, PROTIME in the last 168 hours. Cardiac Enzymes: No results for input(s): CKTOTAL, CKMB, CKMBINDEX, TROPONINI in the last 168 hours. BNP (last 3 results) No results for input(s): PROBNP in the last 8760 hours. HbA1C: No results for input(s): HGBA1C in the last 72 hours. CBG: Recent Labs  Lab 10/26/23 1504 10/26/23 1748 10/26/23 2220 10/27/23 0810 10/27/23 1554  GLUCAP 81 79 83 146* 137*   Lipid Profile: No results for input(s): CHOL, HDL, LDLCALC, TRIG, CHOLHDL, LDLDIRECT in the last 72 hours. Thyroid  Function Tests: Recent Labs    10/27/23 1131  TSH 1.136  Anemia Panel: Recent Labs     10/27/23 1131  VITAMINB12 1,384*   Sepsis Labs: Recent Labs  Lab 10/21/23 2106 10/26/23 1321 10/26/23 1858  PROCALCITON <0.10  --  <0.10  LATICACIDVEN  --  2.0*  --     Recent Results (from the past 240 hours)  Resp panel by RT-PCR (RSV, Flu A&B, Covid) Anterior Nasal Swab     Status: None   Collection Time: 10/21/23  4:23 PM   Specimen: Anterior Nasal Swab  Result Value Ref Range Status   SARS Coronavirus 2 by RT PCR NEGATIVE NEGATIVE Final   Influenza A by PCR NEGATIVE NEGATIVE Final   Influenza B by PCR NEGATIVE NEGATIVE Final    Comment: (NOTE) The Xpert Xpress SARS-CoV-2/FLU/RSV plus assay is intended as an aid in the diagnosis of influenza from Nasopharyngeal swab specimens and should not be used as a sole basis for treatment. Nasal washings and aspirates are unacceptable for Xpert Xpress SARS-CoV-2/FLU/RSV testing.  Fact Sheet for Patients: BloggerCourse.com  Fact Sheet for Healthcare Providers: SeriousBroker.it  This test is not yet approved or cleared by the [place]  FDA and has been authorized for detection and/or diagnosis of SARS-CoV-2 by FDA under an Emergency Use Authorization (EUA). This EUA will remain in effect (meaning this test can be used) for the duration of the COVID-19 declaration under Section 564(b)(1) of the Act, 21 U.S.C. section 360bbb-3(b)(1), unless the authorization is terminated or revoked.     Resp Syncytial Virus by PCR NEGATIVE NEGATIVE Final    Comment: (NOTE) Fact Sheet for Patients: BloggerCourse.com  Fact Sheet for Healthcare Providers: SeriousBroker.it  This test is not yet approved or cleared by the [place]  FDA and has been authorized for detection and/or diagnosis of SARS-CoV-2 by FDA under an Emergency Use Authorization (EUA). This EUA will remain in effect (meaning this test can be used) for the duration of  the COVID-19 declaration under Section 564(b)(1) of the Act, 21 U.S.C. section 360bbb-3(b)(1), unless the authorization is terminated or revoked.  Performed at [facility], [address] [place], [place] [address]   Culture, blood (routine x 2)     Status: None (Preliminary result)   Collection Time: 10/26/23 12:00 PM   Specimen: BLOOD LEFT ARM  Result Value Ref Range Status   Specimen Description BLOOD LEFT ARM  Final   Special Requests   Final    BOTTLES DRAWN AEROBIC AND ANAEROBIC Blood Culture results may not be optimal due to an inadequate volume of blood received in culture bottles   Culture   Final    NO GROWTH < 24 HOURS Performed at [facility], [address], [place], [place] [address]    Report Status PENDING  Incomplete  Culture, blood (routine x 2)     Status: None (Preliminary result)   Collection Time: 10/26/23 12:07 PM   Specimen: BLOOD RIGHT ARM  Result Value Ref Range Status   Specimen Description BLOOD RIGHT ARM  Final   Special Requests   Final    BOTTLES DRAWN AEROBIC ONLY Blood Culture results may not be optimal due to an inadequate volume of blood received in culture bottles   Culture   Final    NO GROWTH < 24 HOURS Performed at [facility], [address] [place], [place] [address]    Report Status PENDING  Incomplete  Resp panel by RT-PCR (RSV, Flu A&B, Covid) Anterior Nasal Swab     Status: None  Collection Time: 10/26/23 12:12 PM   Specimen: Anterior Nasal Swab  Result Value Ref Range Status   SARS Coronavirus 2 by RT PCR NEGATIVE NEGATIVE Final   Influenza A by PCR NEGATIVE NEGATIVE Final   Influenza B by PCR NEGATIVE NEGATIVE Final    Comment: (NOTE) The Xpert Xpress SARS-CoV-2/FLU/RSV plus assay is intended as an aid in the diagnosis of influenza from Nasopharyngeal swab specimens and should not be used as a sole basis for treatment. Nasal washings and aspirates are unacceptable for Xpert Xpress  SARS-CoV-2/FLU/RSV testing.  Fact Sheet for Patients: BloggerCourse.com  Fact Sheet for Healthcare Providers: SeriousBroker.it  This test is not yet approved or cleared by the [place]  FDA and has been authorized for detection and/or diagnosis of SARS-CoV-2 by FDA under an Emergency Use Authorization (EUA). This EUA will remain in effect (meaning this test can be used) for the duration of the COVID-19 declaration under Section 564(b)(1) of the Act, 21 U.S.C. section 360bbb-3(b)(1), unless the authorization is terminated or revoked.     Resp Syncytial Virus by PCR NEGATIVE NEGATIVE Final    Comment: (NOTE) Fact Sheet for Patients: BloggerCourse.com  Fact Sheet for Healthcare Providers: SeriousBroker.it  This test is not yet approved or cleared by the [place]  FDA and has been authorized for detection and/or diagnosis of SARS-CoV-2 by FDA under an Emergency Use Authorization (EUA). This EUA will remain in effect (meaning this test can be used) for the duration of the COVID-19 declaration under Section 564(b)(1) of the Act, 21 U.S.C. section 360bbb-3(b)(1), unless the authorization is terminated or revoked.  Performed at [facility], [address], [place], [place] [address]   MRSA Next Gen by PCR, Nasal     Status: None   Collection Time: 10/26/23  8:00 PM   Specimen: Nasal Mucosa; Nasal Swab  Result Value Ref Range Status   MRSA by PCR Next Gen NOT DETECTED NOT DETECTED Final    Comment: (NOTE) The GeneXpert MRSA Assay (FDA approved for NASAL specimens only), is one component of a comprehensive MRSA colonization surveillance program. It is not intended to diagnose MRSA infection nor to guide or monitor treatment for MRSA infections. Test performance is not FDA approved in patients less than 83 years old. Performed at [facility], [address] [place], [place] [address]      Radiology Studies: EEG adult Result Date: 10/27/2023 Shelton Arlin KIDD, MD     10/27/2023  2:10 PM Patient Name: NANETTA WIEGMAN MRN: [id number] Epilepsy Attending: Arlin KIDD Shelton Referring Physician/Provider: Sherrill Alejandro Donovan, DO Date: 10/27/2023 Duration: 22.53 mins Patient history: 78yo F with ams. EEG to evaluate for seizure Level of alertness: Awake/lethargic AEDs during EEG study: None Technical aspects: This EEG study was done with scalp electrodes positioned according to the 10-20 International system of electrode placement. Electrical activity was reviewed with band pass filter of 1-70Hz , sensitivity of 7 uV/mm, display speed of 42mm/sec with a 60Hz  notched filter applied as appropriate. EEG data were recorded continuously and digitally stored.  Video monitoring was available and reviewed as appropriate. Description: EEG showed continuous generalized polymorphic sharply contoured 3 to 6 Hz theta-delta slowing, at times with triphasic morphology. Hyperventilation and photic stimulation were not performed.   ABNORMALITY - Continuous slow, generalized IMPRESSION: This study is suggestive of moderate to severe diffuse encephalopathy. No seizures or epileptiform discharges were seen throughout the recording. Priyanka KIDD Shelton   CT HEAD WO CONTRAST ( ) Result Date: 10/26/2023 CLINICAL  DATA:  Altered mental status, nontraumatic (Ped 0-17y) EXAM: CT HEAD WITHOUT CONTRAST TECHNIQUE: Contiguous axial images were obtained from the base of the skull through the vertex without intravenous contrast. RADIATION DOSE REDUCTION: This exam was performed according to the departmental dose-optimization program which includes automated exposure control, adjustment of the mA and/or kV according to patient size and/or use of iterative reconstruction technique. COMPARISON:  May 23, 2010 FINDINGS: Brain: The ventricles appear age appropriate. No mass effect or midline shift. Gray-white  differentiation is preserved without focal attenuation abnormality.No evidence of acute territorial infarction, extra-axial fluid collection, hemorrhage, or mass lesion. The basilar cisterns are patent without downward herniation. The cerebellar hemispheres and vermis are well formed without mass lesion or focal attenuation abnormality. Vascular: No hyperdense vessel. Calcified atherosclerotic plaque within the cavernous/supraclinoid ICA and intradural vertebral arteries. Skull: Normal. Negative for fracture or focal lesion. Sinuses/Orbits: The paranasal sinuses and mastoids are clear.The globes appear intact. No retrobulbar hematoma. Other: None. IMPRESSION: No acute intracranial abnormality, specifically, no acute hemorrhage, territorial infarction, or intracranial mass. Electronically Signed   By: Rogelia Myers M.D.   On: 10/26/2023 17:01   DG Chest Portable 1 View Result Date: 10/26/2023 CLINICAL DATA:  Pneumonia EXAM: PORTABLE CHEST 1 VIEW COMPARISON:  Chest radiograph October 21, 2021 at 12:35 p.m. FINDINGS: Cardiomediastinal silhouette is enlarged and obscured by overlying bilateral patchy airspace opacities of both lung fields, increased to prior. Aortic knob is prominent and calcified. Left chest wall dual lead pacer in place with the leads overlying the right atrium and ventricle. Blunting of left costophrenic angle increased to prior. No acute osseous abnormality. Status post median sternotomy and CABG. IMPRESSION: Interval increase in bilateral patchy airspace opacities (edema/multifocal infection/inflammation), left greater than right with new blunting of left costophrenic angle query atelectasis versus effusion. Electronically Signed   By: Megan  Zare M.D.   On: 10/26/2023 13:49   Scheduled Meds:  apixaban   5 mg Oral BID   benzonatate   100 mg Oral BID   docusate sodium   100 mg Oral BID   insulin  aspart  0-20 Units Subcutaneous TID WC   insulin  aspart  0-5 Units Subcutaneous QHS   [START ON  10/28/2023] ipratropium-albuterol   3 mL Nebulization BID   lactulose   20 g Oral BID   metoprolol  succinate  50 mg Oral Daily   predniSONE   10 mg Oral BID WC   sodium chloride  flush  3 mL Intravenous Q12H   Continuous Infusions:  ceFEPime  (MAXIPIME ) IV Stopped (10/27/23 1356)    LOS: 1 day   Alejandro Marker, DO [facility] Available via Epic secure chat 7am-7pm After these hours, please refer to coverage provider listed on [email or web address] 10/27/2023, 6:08 PM

## 2023-10-27 NOTE — Evaluation (Signed)
 Physical Therapy Evaluation Patient Details Name: [name] MRN: [id number] DOB: 1946/01/16 Today's Date: 10/27/2023  History of Present Illness  Pt is a 78 y/o female presenting with AMS and progressive weakness. Recent admission 8/6-8/9 for ILD flare. PMH:  chronic resp failure on 2L, ILD, CHF, CAD s/p CABG, PAF, SSS s/p PPM, T2DM  Clinical Impression  Patient presenting with decreased mobility due to generalized weakness, decreased activity tolerance, decreased sitting balance and decreased cognition.  Previously living with daughter though independent with mobility occasionally using walker.  She was able to get to EOB today with +2 total A and needing min to mod A for sitting balance with difficulty due to back pain, +2 max A for scoot pivots toward [facility] with pt unable to stand.  PT will continue to follow during acute stay and may need post-acute inpatient rehab 9<3 hours/day) prior to d/c home.         If plan is discharge home, recommend the following: Two people to help with walking and/or transfers;Help with stairs or ramp for entrance;Assist for transportation;Two people to help with bathing/dressing/bathroom;Direct supervision/assist for medications management   Can travel by private vehicle   No    Equipment Recommendations Other (comment) (TBA)  Recommendations for Other Services       Functional Status Assessment Patient has had a recent decline in their functional status and demonstrates the ability to make significant improvements in function in a reasonable and predictable amount of time.     Precautions / Restrictions Precautions Precautions: Fall Recall of Precautions/Restrictions: Impaired Precaution/Restrictions Comments: monitor O2 (2-3 L O2 at baseline)      Mobility  Bed Mobility Overal bed mobility: Needs Assistance Bed Mobility: Supine to Sit, Sit to Supine     Supine to sit: +2 for physical assistance, Total assist Sit to supine: +2 for  physical assistance, Total assist   General bed mobility comments: assist for legs off EOB, lifting help for trunk and scooting hips to EOB assist for legs up and trunk to sidelying then to reposition trunk for roll to supine pt c/o back pain throughout    Transfers Overall transfer level: Needs assistance Equipment used: 2 person hand held assist Transfers: Bed to chair/wheelchair/BSC            Lateral/Scoot Transfers: Max assist, +2 physical assistance General transfer comment: attempts at sit to stand unsuccessful so assist for scoot pivot using bed pad under pt (did accept some weight on her legs)    Ambulation/Gait                  Stairs            Wheelchair Mobility     Tilt Bed    Modified Rankin (Stroke Patients Only)       Balance Overall balance assessment: Needs assistance Sitting-balance support: Feet supported Sitting balance-Leahy Scale: Poor Sitting balance - Comments: back support provided due to pain                                     Pertinent Vitals/Pain Pain Assessment Pain Assessment: Faces Faces Pain Scale: Hurts whole lot Pain Location: back with mobility Pain Descriptors / Indicators: Aching Pain Intervention(s): Monitored during session, Repositioned, Limited activity within patient's tolerance    Home Living Family/patient expects to be discharged to:: Private residence Living Arrangements: Spouse/significant other;Children Available Help at Discharge: Family;Neighbor Type of  Home: House Home Access: Ramped entrance     Alternate Level Stairs-Number of Steps: Laundry in basement Home Layout: Two level Home Equipment: Educational psychologist (4 wheels);Wheelchair - Careers adviser (comment) Additional Comments: Daughter disabled w/ back fx- unable to provide lifting assistance    Prior Function Prior Level of Function : Independent/Modified Independent             Mobility Comments: Rollator for  mobility ADLs Comments: Typically able to manage ADLs. Previously assisting husband who is in a wheelchair. Assisted for transportation, IADLs in the home     Extremity/Trunk Assessment   Upper Extremity Assessment Upper Extremity Assessment: Defer to OT evaluation    Lower Extremity Assessment Lower Extremity Assessment: RLE deficits/detail;LLE deficits/detail RLE Deficits / Details: AAROM WFL, strength hip flexion 3-/5, knee extension 3+/5, ankle DF 3+/5 RLE Sensation: history of peripheral neuropathy LLE Deficits / Details: AAROM WFL, strength hip flexion 3-/5, knee extension 3+/5, ankle DF 3+/5 LLE Sensation: history of peripheral neuropathy    Cervical / Trunk Assessment Cervical / Trunk Assessment: Kyphotic  Communication   Communication Communication: Impaired Factors Affecting Communication: Hearing impaired    Cognition Arousal: Lethargic Behavior During Therapy: Flat affect   PT - Cognitive impairments: Attention, Initiation, Problem solving                       PT - Cognition Comments: slow processing, decreased initiation and sustained attention Following commands: Impaired Following commands impaired: Follows one step commands with increased time     Cueing Cueing Techniques: Verbal cues, Gestural cues, Tactile cues     General Comments General comments (skin integrity, edema, etc.): daughter, [name] present, pt on 4L O2    Exercises     Assessment/Plan    PT Assessment Patient needs continued PT services  PT Problem List Decreased strength;Decreased cognition;Decreased knowledge of use of DME;Decreased activity tolerance;Decreased balance;Decreased mobility       PT Treatment Interventions DME instruction;Functional mobility training;Therapeutic activities;Balance training;Therapeutic exercise;Gait training;Wheelchair mobility training;Cognitive remediation    PT Goals (Current goals can be found in the Care Plan section)  Acute Rehab PT  Goals Patient Stated Goal: get back to normal PT Goal Formulation: With family Time For Goal Achievement: 11/10/23 Potential to Achieve Goals: Fair    Frequency Min 3X/week     Co-evaluation               AM-PAC PT 6 Clicks Mobility  Outcome Measure Help needed turning from your back to your side while in a flat bed without using bedrails?: Total Help needed moving from lying on your back to sitting on the side of a flat bed without using bedrails?: Total Help needed moving to and from a bed to a chair (including a wheelchair)?: Total Help needed standing up from a chair using your arms (e.g., wheelchair or bedside chair)?: Total Help needed to walk in hospital room?: Total Help needed climbing 3-5 steps with a railing? : Total 6 Click Score: 6    End of Session   Activity Tolerance: Patient limited by fatigue Patient left: in bed;with call bell/phone within reach;with family/visitor present;with bed alarm set   PT Visit Diagnosis: Other abnormalities of gait and mobility (R26.89);Muscle weakness (generalized) (M62.81);Other symptoms and signs involving the nervous system (R29.898)    Time: 8971-8947 PT Time Calculation (min) (ACUTE ONLY): 24 min   Charges:   PT Evaluation $PT Eval Moderate Complexity: 1 Mod PT Treatments $Therapeutic Activity: 8-22 mins PT General Charges $$  ACUTE PT VISIT: 1 Visit         Micheline Portal, PT Acute Rehabilitation Services [phone] 10/27/2023   Montie Portal 10/27/2023, 1:29 PM

## 2023-10-27 NOTE — Progress Notes (Signed)
 Pt transported from 2W to 6e on Bipap with no complications noted.

## 2023-10-27 NOTE — Procedures (Signed)
 Patient Name: [name]  MRN: [id number]  Epilepsy Attending: Arlin MALVA Krebs  Referring Physician/Provider: Sherrill Alejandro Donovan, DO  Date: 10/27/2023 Duration: 22.53 mins  Patient history: 78yo F with ams. EEG to evaluate for seizure  Level of alertness: Awake/lethargic  AEDs during EEG study: None  Technical aspects: This EEG study was done with scalp electrodes positioned according to the 10-20 International system of electrode placement. Electrical activity was reviewed with band pass filter of 1-70Hz , sensitivity of 7 uV/mm, display speed of 62mm/sec with a 60Hz  notched filter applied as appropriate. EEG data were recorded continuously and digitally stored.  Video monitoring was available and reviewed as appropriate.  Description: EEG showed continuous generalized polymorphic sharply contoured 3 to 6 Hz theta-delta slowing, at times with triphasic morphology. Hyperventilation and photic stimulation were not performed.     ABNORMALITY - Continuous slow, generalized  IMPRESSION: This study is suggestive of moderate to severe diffuse encephalopathy. No seizures or epileptiform discharges were seen throughout the recording.  [name] [name]

## 2023-10-27 NOTE — Plan of Care (Signed)

## 2023-10-28 ENCOUNTER — Inpatient Hospital Stay (HOSPITAL_COMMUNITY)

## 2023-10-28 DIAGNOSIS — E1165 Type 2 diabetes mellitus with hyperglycemia: Secondary | ICD-10-CM

## 2023-10-28 DIAGNOSIS — J849 Interstitial pulmonary disease, unspecified: Secondary | ICD-10-CM | POA: Diagnosis not present

## 2023-10-28 DIAGNOSIS — J9692 Respiratory failure, unspecified with hypercapnia: Secondary | ICD-10-CM

## 2023-10-28 DIAGNOSIS — J189 Pneumonia, unspecified organism: Secondary | ICD-10-CM

## 2023-10-28 DIAGNOSIS — I5030 Unspecified diastolic (congestive) heart failure: Secondary | ICD-10-CM | POA: Diagnosis not present

## 2023-10-28 DIAGNOSIS — J9611 Chronic respiratory failure with hypoxia: Secondary | ICD-10-CM

## 2023-10-28 DIAGNOSIS — I480 Paroxysmal atrial fibrillation: Secondary | ICD-10-CM | POA: Diagnosis not present

## 2023-10-28 DIAGNOSIS — I5032 Chronic diastolic (congestive) heart failure: Secondary | ICD-10-CM | POA: Diagnosis not present

## 2023-10-28 LAB — BLOOD GAS, VENOUS
Acid-Base Excess: 7.5 mmol/L — ABNORMAL HIGH (ref 0.0–2.0)
Bicarbonate: 37 mmol/L — ABNORMAL HIGH (ref 20.0–28.0)
O2 Saturation: 59.8 %
Patient temperature: 36.5
pCO2, Ven: 75 mmHg (ref 44–60)
pH, Ven: 7.3 (ref 7.25–7.43)
pO2, Ven: 37 mmHg (ref 32–45)

## 2023-10-28 LAB — CBC WITH DIFFERENTIAL/PLATELET
Abs Immature Granulocytes: 0.06 K/uL (ref 0.00–0.07)
Basophils Absolute: 0 K/uL (ref 0.0–0.1)
Basophils Relative: 0 %
Eosinophils Absolute: 0 K/uL (ref 0.0–0.5)
Eosinophils Relative: 0 %
HCT: 36.4 % (ref 36.0–46.0)
Hemoglobin: 11.2 g/dL — ABNORMAL LOW (ref 12.0–15.0)
Immature Granulocytes: 1 %
Lymphocytes Relative: 12 %
Lymphs Abs: 1.4 K/uL (ref 0.7–4.0)
MCH: 28 pg (ref 26.0–34.0)
MCHC: 30.8 g/dL (ref 30.0–36.0)
MCV: 91 fL (ref 80.0–100.0)
Monocytes Absolute: 0.9 K/uL (ref 0.1–1.0)
Monocytes Relative: 8 %
Neutro Abs: 9.3 K/uL — ABNORMAL HIGH (ref 1.7–7.7)
Neutrophils Relative %: 79 %
Platelets: 308 K/uL (ref 150–400)
RBC: 4 MIL/uL (ref 3.87–5.11)
RDW: 15 % (ref 11.5–15.5)
WBC: 11.7 K/uL — ABNORMAL HIGH (ref 4.0–10.5)
nRBC: 0.2 % (ref 0.0–0.2)

## 2023-10-28 LAB — COMPREHENSIVE METABOLIC PANEL WITH GFR
ALT: 19 U/L (ref 0–44)
AST: 25 U/L (ref 15–41)
Albumin: 2.9 g/dL — ABNORMAL LOW (ref 3.5–5.0)
Alkaline Phosphatase: 52 U/L (ref 38–126)
Anion gap: 9 (ref 5–15)
BUN: 42 mg/dL — ABNORMAL HIGH (ref 8–23)
CO2: 31 mmol/L (ref 22–32)
Calcium: 9.2 mg/dL (ref 8.9–10.3)
Chloride: 97 mmol/L — ABNORMAL LOW (ref 98–111)
Creatinine, Ser: 1.08 mg/dL — ABNORMAL HIGH (ref 0.44–1.00)
GFR, Estimated: 53 mL/min — ABNORMAL LOW (ref >60)
Glucose, Bld: 165 mg/dL — ABNORMAL HIGH (ref 70–99)
Potassium: 4.2 mmol/L (ref 3.5–5.1)
Sodium: 137 mmol/L (ref 135–145)
Total Bilirubin: 0.8 mg/dL (ref 0.0–1.2)
Total Protein: 6.3 g/dL — ABNORMAL LOW (ref 6.5–8.1)

## 2023-10-28 LAB — URINALYSIS, COMPLETE (UACMP) WITH MICROSCOPIC
Bilirubin Urine: NEGATIVE
Glucose, UA: >=500 mg/dL — AB
Hgb urine dipstick: NEGATIVE
Ketones, ur: NEGATIVE mg/dL
Leukocytes,Ua: NEGATIVE
Nitrite: NEGATIVE
Protein, ur: NEGATIVE mg/dL
Specific Gravity, Urine: 1.018 (ref 1.005–1.030)
pH: 5 (ref 5.0–8.0)

## 2023-10-28 LAB — MAGNESIUM: Magnesium: 2.4 mg/dL (ref 1.7–2.4)

## 2023-10-28 LAB — GLUCOSE, CAPILLARY
Glucose-Capillary: 164 mg/dL — ABNORMAL HIGH (ref 70–99)
Glucose-Capillary: 184 mg/dL — ABNORMAL HIGH (ref 70–99)
Glucose-Capillary: 214 mg/dL — ABNORMAL HIGH (ref 70–99)
Glucose-Capillary: 156 mg/dL — ABNORMAL HIGH (ref 70–99)

## 2023-10-28 LAB — PHOSPHORUS: Phosphorus: 3.2 mg/dL (ref 2.5–4.6)

## 2023-10-28 LAB — RPR: RPR Ser Ql: NONREACTIVE

## 2023-10-28 MED ORDER — SODIUM CHLORIDE 0.9 % IV SOLN
2.0000 g | Freq: Two times a day (BID) | INTRAVENOUS | Status: DC
  Administered 2023-10-28 – 2023-10-29 (×3): 2 g via INTRAVENOUS
  Filled 2023-10-28 (×2): qty 12.5

## 2023-10-28 MED ORDER — FUROSEMIDE 10 MG/ML IJ SOLN
40.0000 mg | Freq: Two times a day (BID) | INTRAMUSCULAR | Status: AC
  Administered 2023-10-28 (×4): 40 mg via INTRAVENOUS
  Filled 2023-10-28 (×2): qty 4

## 2023-10-28 NOTE — TOC Progression Note (Signed)
 Transition of Care [facility]) - Progression Note    Patient Details  Name: [name] MRN: [id number] Date of Birth: 1945-04-19  Transition of [facility]) CM/SW Contact  Graves-Bigelow, Erminio Deems, RN Phone Number: 10/28/2023, 11:37 AM  Clinical Narrative: CSW asked Case Manager to speak with daughter [name] regarding home health services. Case Manager was able to speak with daughter outside of the room. Daughter wanted to know if [facility] would pay for personal care services. Case Manager did make daughter aware of what the insurance would pay for moving forward. Daughter states patient is not eligible for Medicaid at this time. Case Manager discussed home health services and the 3 week period vs personal care serivces in the home which is an out of pocket cost. The patient's spouse is a Cytogeneticist; Sports coach did suggest that the spouse or daughter reach out the [place] CSW to see if the [place] can assist with the [employer] Aide services for patient. Spouse is a 100% disabled; so he does get a disability check each month and we discussed that the daughter check with her father to see if he can afford personal care services for the spouse out of pocket. Case Manager will continue to follow for additional disposition needs.    Expected Discharge Plan: Skilled Nursing Facility Barriers to Discharge: Continued Medical Work up   Expected Discharge Plan and Services In-house Referral: Clinical Social Work Discharge Planning Services: CM Consult   Living arrangements for the past 2 months: Single Family Home                   DME Agency: NA  Social Drivers of Health (SDOH) Interventions SDOH Screenings   Food Insecurity: No Food Insecurity (10/26/2023)  Housing: Low Risk  (10/26/2023)  Transportation Needs: No Transportation Needs (10/26/2023)  Utilities: Not At Risk (10/26/2023)  Financial Resource Strain: Low Risk  (12/09/2018)  Social Connections: Unknown (10/26/2023)  Tobacco Use: Low Risk  (10/26/2023)     Readmission Risk Interventions    10/22/2023    4:58 PM  Readmission Risk Prevention Plan  Medication Screening Complete  Transportation Screening Complete

## 2023-10-28 NOTE — TOC Progression Note (Signed)
 Transition of Care [facility]) - Progression Note    Patient Details  Name: [name] MRN: [id number] Date of Birth: 1946-01-06  Transition of Care [facility]) CM/SW Contact  Isaiah Public, LCSWA Phone Number: 10/28/2023, 11:17 AM  Clinical Narrative:     CSW followed up to see if [facility] skilled nursing facility could offer SNF bed for patient, which is one of patients daughters top choices if she decides for patient to go to short term rehab. CSW spoke with Admissions with [facility] and they requested that CSW fax referral over to [phone]. CSW faxed over referral for review. CSW will continue to follow.  Update- CSW spoke with patients daughter [name] at bedside and provided medicare compare SNF bed offers. CSW informed patients daughter that CSW faxed over referral to [facility] and they are currently reviewing referral. Patients daughter informed CSW she is still hopeful that patient will be able to return home but that right now she is still deciding on dc plan for patient, SNF vrs. Patient returning home. Patients daughter plans to review bed offers and CSW will follow up on dc plan.  CSW received call from [facility] who informed CSW they are unable to offer SNF bed right now. They have no current SNF beds right now.  CSW will continue to follow.                   Expected Discharge Plan and Services                                               Social Drivers of Health (SDOH) Interventions SDOH Screenings   Food Insecurity: No Food Insecurity (10/26/2023)  Housing: Low Risk  (10/26/2023)  Transportation Needs: No Transportation Needs (10/26/2023)  Utilities: Not At Risk (10/26/2023)  Financial Resource Strain: Low Risk  (12/09/2018)  Social Connections: Unknown (10/26/2023)  Tobacco Use: Low Risk  (10/26/2023)    Readmission Risk Interventions    10/22/2023    4:58 PM  Readmission Risk Prevention Plan  Medication Screening Complete   Transportation Screening Complete

## 2023-10-28 NOTE — Progress Notes (Signed)
 NAME:  [name], MRN:  [id number], DOB:  1945-12-13, LOS: 2 ADMISSION DATE:  10/26/2023, CONSULTATION DATE:  10/26/23 REFERRING MD:  Dr Lanie Herald, CHIEF COMPLAINT:  AMS/ Weakness   History of Present Illness:  78 year old female patient with history of chronic hypoxic respiratory failure secondary to ILD, HFpEF, CAD, diabetes recent hospitalization for acute exacerbation of pulmonary fibrosis given supportive measures, steroids, antibiotics, bronchodilators discharged on Augmentin  and azithromycin  with steroid taper. Patient presenting with weakness, cough, more confusion per the family. In the ER, patient was persistently short of breath, chest film revealed left-sided airspace opacities.  Station started on antibiotics started on steroids.  Pulmonary asked to see this patient for evaluation for underlying ILD and ongoing comorbidities.  Pertinent  Medical History   Past Medical History:  Diagnosis Date   Asthmatic bronchitis    CHF (congestive heart failure) (HCC) dx'd 2016   Coronary atherosclerosis of native coronary artery    a. s/p CABG 2005 Dr Dusty     Hyperlipidemia    Hypertension    Hypertensive heart disease    Kidney stones    Mixed hyperlipidemia    Nephrolithiasis 06/07/2015   Paroxysmal atrial fibrillation (HCC)    a. s/p Maze in 2005 b. recurrence in March 2016 with >8sec posttermination pauses s/p PPM implant     Pneumonia 1950   double   Presence of permanent cardiac pacemaker    Restrictive lung disease 03/03/2018   Sleep apnea    tested; mask ordered; couldn't afford it; will get it now (09/04/2015)   TIA (transient ischemic attack)    Type II diabetes mellitus (HCC)    Uterine cancer [facility])    age 47 with partial hysterectomy   Significant Hospital Events: Including procedures, antibiotic start and stop dates in addition to other pertinent events   CT chest 10/21/2023-interstitial lung disease, groundglass changes  Interim History / Subjective:   Admitted for shortness of breath, interstitial lung disease, altered mental status Was transferred to progressive medical floor, was placed on BiPAP Kept BiPAP on most of the night BiPAP was replaced this morning  Objective    Blood pressure (!) 175/71, pulse 60, temperature 97.7 F (36.5 C), temperature source Axillary, resp. rate (!) 21, height 5' 3 (1.6 m), weight 94.2 kg, SpO2 99%.    Vent Mode: BIPAP FiO2 (%):  [40 %] 40 % Set Rate:  [16 bmp] 16 bmp PEEP:  [5 cmH20] 5 cmH20   Intake/Output Summary (Last 24 hours) at 10/28/2023 9061 Last data filed at 10/28/2023 9175 Gross per 24 hour  Intake 490.94 ml  Output 600 ml  Net -109.06 ml   Filed Weights   10/26/23 1135  Weight: 94.2 kg    Examination: General: Chronically ill-appearing, more easily arousable this morning HENT: Moist oral mucosa Lungs:  decreased air movement bilaterally Cardiovascular: S1-S2 appreciated Abdomen: Soft, bowel sounds appreciated Extremities: No clubbing, no edema Neuro: Arousable, moving all extremities GU:   I reviewed last 24 h vitals and pain scores, last 48 h intake and output, last 24 h labs and trends, and last 24 h imaging results. ABG 7.28/66  Resolved problem list   Assessment and Plan   Interstitial lung disease - Was recently hospitalized - Was treated with a course of steroids and antibiotics - Discharged on Augmentin  and azithromycin  - Tapering doses of steroids  Recent pneumonia - Cough productive of yellow-green sputum, procalcitonin less than 0.1, MRSA PCR negative - Cultures negative to date - Continue to  follow cultures - If no fevers and no increased WBC count with negative procalcitonin - Will suggest to de-escalate to ceftriaxone  after today's dose of cefepime   Hypercapnic respiratory failure - Tolerating BiPAP well - Continue steroid taper  Chronic hypoxic respiratory failure on 2 L of oxygen  Was recently started on 4 L - Wean down FiO2 to maintain  saturations greater than 89%  -Repeat VBG today and allow time off BiPAP as tolerated  Heart failure with preserved ejection fraction - Was being diuresed  Mental status may be multifactorial - EEG is negative -MRI ordered-pending  Coronary artery disease status post CABG 2005  Paroxysmal atrial fibrillation - On Toprol   Diabetes - Moderate SSI - On insulin   She is DNR status  Jennet Epley, MD [facility] Pager: See Tracey

## 2023-10-28 NOTE — CV Procedure (Signed)
  Device system confirmed to be MRI conditional, with implant date > 6 weeks ago, and no evidence of abandoned or epicardial leads in review of most recent CXR  Device last cleared by EP Provider: Daphne Barrack 10/27/23  Clearance is good through for 1 year as long as parameters remain stable at time of check. If pt undergoes a cardiac device procedure during that time, they should be re-cleared.   Tachy-therapies to be programmed off if applicable with device back to pre-MRI settings after completion of exam.  [employer] - Programming recommendation received through [employer] App/Tablet  Rocky Catalan, RT  10/28/2023 12:42 PM

## 2023-10-28 NOTE — Progress Notes (Addendum)
 [facility]  PROGRESS NOTE  [name] FMW:[id number] DOB: [birth year] DOA: 10/26/2023 PCP: Larnell Hamilton, MD   Brief HPI:    78 y.o. 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  She was last hospitalized from 8/6-9 with an acute exacerbation of pulmonary fibrosis and responded well to IV Solumedrol, empiric antibiotics, nebulizers, supplemental O2. She was discharged on PO Augmentin  + Azithromycin  and a prednisone  taper.  She is currently on 4L home O2.    Presents with confusion and worsening somnolence.  Found to be hypercarbic and transferred to the progressive care unit and placed on BiPAP.  Pulmonary consulted and changed her back to oral prednisone .  Working her up for encephalopathy otherwise as below.  PT/OT recommending SNF now.     Assessment/Plan:   Hypersomnolence -Somnolence has improved after using BiPAP - Likely in setting of hypercapnic respiratory failure, Peace due to elevated at 84 per VBG from 10/27/2023 - pCO2 improved to 75 with BiPAP; continue BiPAP as tolerated - Patient's mental status has improved today  Acute encephalopathy - Likely in setting of above - TSH 0.126, B12 1384, HIV nonreactive   Checked EEG and showed that the study was suggestive of moderate to severe diffuse encephalopathy with no seizures or epileptiform discharges seen throughout the recording.  -MRI brain ordered, result pending  Acute on chronic HFpEF -Appears fluid overload -BNP 323.5 - Patient takes Lasix  40 mg p.o. twice daily at home - Will start Lasix  40 mg IV every 12 hours and assess diuretic response  Recent pneumonia -Treated with antibiotics and steroids -Procalcitonin less than 0.10 -Currently on cefepime  -Will switch to IV ceftriaxone  from tomorrow morning   ILD Flare: Patient with recent hospitalization for ILD  flare.  -She was treated with course of steroids and antibiotics - Currently on prednisone  -Continue cefepime  -Changed to ceftriaxone  from tomorrow morning   Chronic HFpEF:  Appears compensated. Hold furosemide  for now.  Needs strict I's and O's and daily weights   CAD  S/p CABG: No c/o CP   Afib:  Has pacemaker and Rate controlled with Toprol  XL. C/w Anticoagulation w/ Apixaban    HLD: On Repatha  at home   DMType 2: A1c 7.4 Continue sliding scale insulin  with NovoLog  -CBG well-controlled  Normocytic anemia - CTM for S/Sx of Bleeding; No overt bleeding noted.  - Hemoglobin stable at 11.2    Class II Obesity: Complicates overall prognosis and care. Estimated body mass index is 36.79 kg/m as calculated from the following:   Height as of this encounter: 5' 3 (1.6 m).   Weight as of this encounter: 94.2 kg. Weight Loss and Dietary Counseling given    Medications     apixaban   5 mg Oral BID   benzonatate   100 mg Oral BID   docusate sodium   100 mg Oral BID   insulin  aspart  0-20 Units Subcutaneous TID WC   insulin  aspart  0-5 Units Subcutaneous QHS   ipratropium-albuterol   3 mL Nebulization BID   lactulose   20 g Oral BID   metoprolol  succinate  50 mg Oral Daily   predniSONE   10 mg Oral BID WC   sodium chloride  flush  3 mL Intravenous Q12H     Data Reviewed:   CBG:  Recent Labs  Lab 10/26/23 1748 10/26/23 2220 10/27/23 0810 10/27/23 1554 10/27/23 2059  GLUCAP 79 83 146* 137*  122*    SpO2: 97 % O2 Flow Rate (L/min): 4 L/min FiO2 (%): 40 %    Vitals:   10/27/23 2342 10/28/23 0000 10/28/23 0341 10/28/23 0438  BP: (!) 142/66  (!) 175/71   Pulse: 60 60  (!) 59  Resp: 20   19  Temp: 98.1 F (36.7 C)  97.7 F (36.5 C)   TempSrc: Axillary  Axillary   SpO2: 96% 93%  97%  Weight:      Height:          Data Reviewed:  Basic Metabolic Panel: Recent Labs  Lab 10/21/23 2106 10/22/23 0229 10/23/23 0230 10/24/23 0237 10/26/23 1146 10/27/23 0251  10/27/23 1106 10/28/23 0358  NA  --  137 138 138 140 136 131* 137  K  --  3.7 3.6 4.0 4.5 4.9 4.7 4.2  CL  --  97* 97* 97* 99 99  --  97*  CO2  --  28 30 31 27 30   --  31  GLUCOSE  --  210* 289* 234* 75 113*  --  165*  BUN  --  22 37* 48* 39* 40*  --  42*  CREATININE  --  1.16* 1.48* 1.47* 1.02* 1.01*  --  1.08*  CALCIUM   --  8.9 9.5 9.4 9.1 8.8*  --  9.2  MG 1.8 2.0 2.1  --   --   --   --  2.4  PHOS  --  4.8*  --   --   --   --   --  3.2    CBC: Recent Labs  Lab 10/22/23 0229 10/23/23 0230 10/24/23 0237 10/26/23 1146 10/27/23 0251 10/27/23 1106 10/28/23 0358  WBC 9.9 15.5* 15.9* 10.5 9.5  --  11.7*  NEUTROABS 9.1*  --   --  7.6  --   --  9.3*  HGB 11.6* 11.6* 11.6* 11.9* 11.6* 11.6* 11.2*  HCT 37.1 37.3 38.1 38.9 39.9 34.0* 36.4  MCV 89.6 90.8 92.3 92.2 95.2  --  91.0  PLT 289 345 346 316 304  --  308    LFT Recent Labs  Lab 10/22/23 0229 10/26/23 1146 10/27/23 1131 10/28/23 0358  AST 23 26 28 25   ALT 14 16 20 19   ALKPHOS 63 51 53 52  BILITOT 0.8 0.6 0.8 0.8  PROT 7.2 6.8 6.4* 6.3*  ALBUMIN 2.9* 3.1* 2.9* 2.9*     Antibiotics: Anti-infectives (From admission, onward)    Start     Dose/Rate Route Frequency Ordered Stop   10/27/23 1000  vancomycin  (VANCOREADY) IVPB 750 mg/150 mL  Status:  Discontinued        750 mg 150 mL/hr over 60 Minutes Intravenous Every 24 hours 10/26/23 1943 10/27/23 1324   10/27/23 0200  ceFEPIme  (MAXIPIME ) 2 g in sodium chloride  0.9 % 100 mL IVPB  Status:  Discontinued        2 g 200 mL/hr over 30 Minutes Intravenous Every 12 hours 10/26/23 1943 10/27/23 1810   10/26/23 1430  vancomycin  (VANCOCIN ) IVPB 1000 mg/200 mL premix  Status:  Discontinued        1,000 mg 200 mL/hr over 60 Minutes Intravenous  Once 10/26/23 1421 10/26/23 1424   10/26/23 1430  ceFEPIme  (MAXIPIME ) 2 g in sodium chloride  0.9 % 100 mL IVPB        2 g 200 mL/hr over 30 Minutes Intravenous  Once 10/26/23 1421 10/26/23 1504   10/26/23 1430  vancomycin   (VANCOREADY) IVPB 1500 mg/300 mL  1,500 mg 150 mL/hr over 120 Minutes Intravenous  Once 10/26/23 1424 10/26/23 1750        DVT prophylaxis: Apixaban   Code Status: DNR  Family Communication:    CONSULTS    Subjective   Mental status has improved.  She is more awake this morning.  Answering questions appropriately   Objective    Physical Examination:   Appears in no acute distress S1-S2, regular, no murmur auscultated Lungs-bilateral crackles auscultated Abdomen is soft, nontender, no organomegaly Extremities no edema Neuro-alert, oriented x 3, answering questions appropriately  Status is: Inpatient:             [name]   [facility] If 7PM-7AM, please contact night-coverage at [email or web address], Office  [phone]   10/28/2023, 8:17 AM  LOS: 2 days

## 2023-10-28 NOTE — Progress Notes (Signed)
 SLP Cancellation Note  Patient Details Name: [name] MRN: [id number] DOB: 1945/04/12   Cancelled treatment:       Reason Eval/Treat Not Completed: Patient declined, no reason specified; pt c/o diarrhea, asked SLP to return next date; family stated intermittent coughing with thin/ice chips at home.   Pat Rashay Barnette,M.S.,CCC-SLP 10/28/2023, 2:49 PM

## 2023-10-28 NOTE — Plan of Care (Signed)
   Problem: Health Behavior/Discharge Planning: Goal: Ability to manage health-related needs will improve Outcome: Progressing

## 2023-10-29 ENCOUNTER — Other Ambulatory Visit (HOSPITAL_BASED_OUTPATIENT_CLINIC_OR_DEPARTMENT_OTHER)

## 2023-10-29 DIAGNOSIS — I5030 Unspecified diastolic (congestive) heart failure: Secondary | ICD-10-CM | POA: Diagnosis not present

## 2023-10-29 DIAGNOSIS — J9611 Chronic respiratory failure with hypoxia: Secondary | ICD-10-CM | POA: Diagnosis not present

## 2023-10-29 DIAGNOSIS — J849 Interstitial pulmonary disease, unspecified: Secondary | ICD-10-CM | POA: Diagnosis not present

## 2023-10-29 DIAGNOSIS — I5032 Chronic diastolic (congestive) heart failure: Secondary | ICD-10-CM | POA: Diagnosis not present

## 2023-10-29 DIAGNOSIS — J9692 Respiratory failure, unspecified with hypercapnia: Secondary | ICD-10-CM | POA: Diagnosis not present

## 2023-10-29 DIAGNOSIS — I4891 Unspecified atrial fibrillation: Secondary | ICD-10-CM

## 2023-10-29 DIAGNOSIS — E66812 Obesity, class 2: Secondary | ICD-10-CM | POA: Diagnosis not present

## 2023-10-29 DIAGNOSIS — Z950 Presence of cardiac pacemaker: Secondary | ICD-10-CM | POA: Diagnosis not present

## 2023-10-29 LAB — GLUCOSE, CAPILLARY
Glucose-Capillary: 170 mg/dL — ABNORMAL HIGH (ref 70–99)
Glucose-Capillary: 195 mg/dL — ABNORMAL HIGH (ref 70–99)
Glucose-Capillary: 207 mg/dL — ABNORMAL HIGH (ref 70–99)
Glucose-Capillary: 253 mg/dL — ABNORMAL HIGH (ref 70–99)
Glucose-Capillary: 132 mg/dL — ABNORMAL HIGH (ref 70–99)

## 2023-10-29 LAB — CBC
HCT: 35.5 % — ABNORMAL LOW (ref 36.0–46.0)
Hemoglobin: 10.9 g/dL — ABNORMAL LOW (ref 12.0–15.0)
MCH: 27.8 pg (ref 26.0–34.0)
MCHC: 30.7 g/dL (ref 30.0–36.0)
MCV: 90.6 fL (ref 80.0–100.0)
Platelets: 251 K/uL (ref 150–400)
RBC: 3.92 MIL/uL (ref 3.87–5.11)
RDW: 15.4 % (ref 11.5–15.5)
WBC: 8.3 K/uL (ref 4.0–10.5)
nRBC: 0 % (ref 0.0–0.2)

## 2023-10-29 LAB — BASIC METABOLIC PANEL WITH GFR
Anion gap: 8 (ref 5–15)
BUN: 35 mg/dL — ABNORMAL HIGH (ref 8–23)
CO2: 35 mmol/L — ABNORMAL HIGH (ref 22–32)
Calcium: 8.9 mg/dL (ref 8.9–10.3)
Chloride: 95 mmol/L — ABNORMAL LOW (ref 98–111)
Creatinine, Ser: 0.94 mg/dL (ref 0.44–1.00)
GFR, Estimated: >60 mL/min (ref >60)
Glucose, Bld: 166 mg/dL — ABNORMAL HIGH (ref 70–99)
Potassium: 4.1 mmol/L (ref 3.5–5.1)
Sodium: 138 mmol/L (ref 135–145)

## 2023-10-29 MED ORDER — FUROSEMIDE 40 MG PO TABS
40.0000 mg | ORAL_TABLET | Freq: Two times a day (BID) | ORAL | Status: DC
  Administered 2023-10-29 – 2023-11-01 (×6): 40 mg via ORAL
  Filled 2023-10-29 (×6): qty 1

## 2023-10-29 MED ORDER — SODIUM CHLORIDE 0.9 % IV SOLN
2.0000 g | INTRAVENOUS | Status: DC
  Administered 2023-10-29: 2 g via INTRAVENOUS
  Filled 2023-10-29: qty 20

## 2023-10-29 NOTE — Progress Notes (Signed)
 Speech Language Pathology Treatment: Dysphagia  Patient Details Name: [name] MRN: [id number] DOB: 1946/01/24 Today's Date: 10/29/2023 Time: 1000-1020 SLP Time Calculation (min) (ACUTE ONLY): 20 min  Assessment / Plan / Recommendation Clinical Impression  Pt seen for f/u dysphagia tx with limited intake d/t lethargy.  Daughter present during session stating she has eaten limited amounts d/t fatigue/lethargy consuming only a bite or two during meals.  Pt consumed several sips of thin via cup, puree and soft solids with prolonged mastication, slight oral holding and min verbal cues to initiate swallow d/t impaired mentation.  No overt s/s of aspiration noted throughout session, but pt encouraged and educated re: respiratory/swallowing reciprocity including small bites/sips, utilizing a slow rate and upright positioning during all po intake d/t respiratory hx/SOB.  Pt/daughter in agreement with strategies.  ST will f/u for diet tolerance/dysphagia tx during acute stay and need for potential objective assessment prn if symptoms persist.  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 Patient presenting with weakness, cough, more confusion per the family. Diagnosed with pna. In the ER, patient was persistently short of breath, chest film revealed left-sided airspace opacities.  ST f/u for dysphagia tx/diet tolerance.      SLP Plan  Continue with current plan of care          Recommendations  Diet recommendations: Dysphagia 3 (mechanical soft);Thin liquid Liquids provided via: Cup Medication Administration: Whole meds with liquid Supervision: Staff to assist with self feeding;Intermittent supervision to cue for  compensatory strategies Compensations: Slow rate;Small sips/bites Postural Changes and/or Swallow Maneuvers: Seated upright 90 degrees                  Oral care BID   Other (comment) (TBD) Dysphagia, unspecified (R13.10)     Continue with current plan of care     [name],M.S.,CCC-SLP  10/29/2023, 10:35 AM

## 2023-10-29 NOTE — TOC Progression Note (Addendum)
 Transition of Care [facility]) - Progression Note    Patient Details  Name: [name] MRN: [id number] Date of Birth: 09-Jan-1946  Transition of [facility]) CM/SW Contact  Isaiah Public, LCSWA Phone Number: 10/29/2023, 10:04 AM  Clinical Narrative:     CSW followed up with patient and patients daughter [name] at bedside on dc plan. Patients daughter confirmed patients plan is to return home when medically ready. CSW informed CM. CM to follow up on patients home needs.No further questions reported at this time.  Expected Discharge Plan: Skilled Nursing Facility Barriers to Discharge: Continued Medical Work up               Expected Discharge Plan and Services In-house Referral: Clinical Social Work Discharge Planning Services: CM Consult   Living arrangements for the past 2 months: Single Family Home                   DME Agency: NA                   Social Drivers of Health (SDOH) Interventions SDOH Screenings   Food Insecurity: No Food Insecurity (10/26/2023)  Housing: Low Risk  (10/26/2023)  Transportation Needs: No Transportation Needs (10/26/2023)  Utilities: Not At Risk (10/26/2023)  Financial Resource Strain: Low Risk  (12/09/2018)  Social Connections: Unknown (10/26/2023)  Tobacco Use: Low Risk  (10/26/2023)    Readmission Risk Interventions    10/22/2023    4:58 PM  Readmission Risk Prevention Plan  Medication Screening Complete  Transportation Screening Complete

## 2023-10-29 NOTE — TOC Progression Note (Addendum)
 Transition of Care [facility]) - Progression Note    Patient Details  Name: [name] MRN: [id number] Date of Birth: [birth year]  [facility]) CM/SW Contact  Graves-Bigelow, Erminio Deems, RN Phone Number: 10/29/2023, 10:28 AM  Clinical Narrative: Case Manager received notification from CSW that the daughter wants to take the patient home with home health services. Patient was previously active with [facility] for PT-CM added RN and aide. Patient will need orders for the above services-MD is aware. Daughter wants DME via [facility] and NIV. Awaiting on paperwork for MD to sign. Patient will need PTAR for transport home. Case Manager will continue to follow for additional disposition needs.    10-29-23 1112 Submitted NIV paperwork to [facility].   10-29-23 1423 Awaiting insurance authorization for NIV. Message sent to [facility] to make them aware of possible discharge on Friday.   10-29-23 1446 Case Manager received notification that the NIV Auth is pending and per [employer] insurance it can take up to 5-7 business days before we hear about approval. Case Manager made MD aware.   Expected Discharge Plan: Home w Home Health Services Barriers to Discharge: No Barriers Identified  Expected Discharge Plan and Services In-house Referral: Clinical Social Work Discharge Planning Services: CM Consult Post Acute Care Choice: Home Health, Durable Medical Equipment Living arrangements for the past 2 months: Single Family Home                 DME Arranged: Hospital bed, NIV DME Agency: [facility] Date DME Agency Contacted: 10/29/23 Time DME Agency Contacted: 1027 Representative spoke with at DME Agency: [facility] HH Arranged: RN, Disease Management, PT, Nurse's Aide HH Agency: [facility] Date HH Agency Contacted: 10/29/23 Time HH Agency Contacted: 1028 Representative spoke with at [facility] Agency: [facility]  Social Drivers of Health (SDOH)  Interventions SDOH Screenings   Food Insecurity: No Food Insecurity (10/26/2023)  Housing: Low Risk  (10/26/2023)  Transportation Needs: No Transportation Needs (10/26/2023)  Utilities: Not At Risk (10/26/2023)  Financial Resource Strain: Low Risk  (12/09/2018)  Social Connections: Unknown (10/26/2023)  Tobacco Use: Low Risk  (10/26/2023)    Readmission Risk Interventions    10/22/2023    4:58 PM  Readmission Risk Prevention Plan  Medication Screening Complete  Transportation Screening Complete

## 2023-10-29 NOTE — Progress Notes (Signed)
 Physical Therapy Treatment Patient Details Name: [name] MRN: [id number] DOB: 1945-05-02 Today's Date: 10/29/2023   History of Present Illness Pt is a 78 y/o female presenting 8/11 with AMS and progressive weakness. Recent admission 8/6-8/9 for ILD flare. PMH:  chronic resp failure on 2L, ILD, CHF, CAD s/p CABG, PAF, SSS s/p PPM, T2DM    PT Comments  The pt's family confirmed that the pt will have 24/7 care available to her at d/c. The pt and family expressed desire for pt to d/c home rather than to inpatient rehab, thus updated recs to reflect this. The pt demonstrated improved strength, balance, and activity tolerance today as she progressed to only needing minA for bed mobility, transfers, and bedroom distance gait bouts with RW support. She remains at risk for falls though with noted continued deficits in cognition, balance, endurance, and strength. She was limited this date by symptomatic (nausea) orthostatic hypotension, see below. Of note, extended period of time managing bleeding from L UE IV site and with pt needing to have a BM. Upon arrival, pt's L UE IV was half out, so this PT taped it down to try to save it but as soon as the pt stood up it started pouring out blood, so the NT came in and assisted and pulled it out and NT and PT controlled the bleeding. RN notified of IV and of orthostatic hypotension. Will continue to follow acutely.   BP --  114/55 (74) sitting 99/59 (73) sitting after ambulating 155/66 (92) supine end of session     If plan is discharge home, recommend the following: Help with stairs or ramp for entrance;Assist for transportation;Direct supervision/assist for medications management;A little help with walking and/or transfers;A lot of help with bathing/dressing/bathroom;Assistance with cooking/housework;Direct supervision/assist for financial management;Supervision due to cognitive status   Can travel by private vehicle     Yes  Equipment  Recommendations  None recommended by PT    Recommendations for Other Services       Precautions / Restrictions Precautions Precautions: Fall Recall of Precautions/Restrictions: Impaired Precaution/Restrictions Comments: monitor O2 (2-3 L O2 at baseline); watch BP (orthostatic 8/14) Restrictions Weight Bearing Restrictions Per Provider Order: No     Mobility  Bed Mobility Overal bed mobility: Needs Assistance Bed Mobility: Supine to Sit, Sit to Supine     Supine to sit: Contact guard, HOB elevated, Used rails Sit to supine: Min assist, HOB elevated   General bed mobility comments: Extra time and cues for use of rails, but pt able to ascend trunk and sit up R EOB from elevated HOB with CGA for safety. MinA needed at legs to lift back up onto the bed with return to supine, cuing pt to lean laterally to her R elbow to descend her trunk.    Transfers Overall transfer level: Needs assistance Equipment used: Rolling walker (2 wheels) Transfers: Sit to/from Stand, Bed to chair/wheelchair/BSC Sit to Stand: Min assist   Step pivot transfers: Min assist       General transfer comment: Cues needed repeatedly for each transfer to stand from sitting, minA to power up to stand and gain balance each rep, x2 from EOB and x2 from commode. MinA needed for balance with step pivot to L bed > commode with RW support.    Ambulation/Gait Ambulation/Gait assistance: Min assist Gait Distance (Feet): 20 Feet (x2 bouts of ~3 ft > ~20 ft) Assistive device: Rolling walker (2 wheels) Gait Pattern/deviations: Step-through pattern, Decreased stride length, Trunk flexed, Decreased step  length - right, Decreased step length - left Gait velocity: reduced Gait velocity interpretation: <1.31 ft/sec, indicative of household ambulator   General Gait Details: Pt takes slow, small, mildly unsteady steps in the room. Distance limited by symptomatic orthostatic hypotension. Cues provided to remain proximal to  her RW. MinA for balance throughout.   Stairs             Wheelchair Mobility     Tilt Bed    Modified Rankin (Stroke Patients Only)       Balance Overall balance assessment: Needs assistance Sitting-balance support: No upper extremity supported, Feet supported Sitting balance-Leahy Scale: Fair Sitting balance - Comments: static sitting EOB with supervision for safety   Standing balance support: Bilateral upper extremity supported, Single extremity supported, During functional activity, Reliant on assistive device for balance Standing balance-Leahy Scale: Poor Standing balance comment: Pt able to reach off COG with R UE to perform self pericare while standing with L hand on RW for support and CGA for safety. Pt reliant on RW to ambulate along with minA for balance.                            Communication Communication Communication: Impaired Factors Affecting Communication: Hearing impaired  Cognition Arousal: Alert Behavior During Therapy: WFL for tasks assessed/performed   PT - Cognitive impairments: Problem solving, Safety/Judgement, Sequencing, Attention, Awareness, Memory                       PT - Cognition Comments: Delayed processing. Needs cues to sequence tasks/mobility and reminders for hand placement with each rep. Following commands: Impaired Following commands impaired: Follows one step commands with increased time, Follows multi-step commands with increased time, Follows multi-step commands inconsistently    Cueing Cueing Techniques: Verbal cues, Tactile cues  Exercises      General Comments General comments (skin integrity, edema, etc.): pt's L UE IV was half out upon arrival, this PT taped it down to try to save it but as soon as the pt stood up it started pouring out blood, so the NT came in and assisted and pulled it out and NT and PT controlled the bleeding - RN notified; BP -- 114/55 (74) sitting, 99/59 (73) sitting after  ambulating, 155/66 (92) supine end of session      Pertinent Vitals/Pain Pain Assessment Pain Assessment: Faces Faces Pain Scale: Hurts little more Pain Location: buttocks with pericare Pain Descriptors / Indicators: Discomfort, Sore Pain Intervention(s): Limited activity within patient's tolerance, Monitored during session    Home Living                          Prior Function            PT Goals (current goals can now be found in the care plan section) Acute Rehab PT Goals Patient Stated Goal: get back to normal PT Goal Formulation: With patient/family Time For Goal Achievement: 11/10/23 Potential to Achieve Goals: Fair Progress towards PT goals: Progressing toward goals    Frequency    Min 3X/week      PT Plan      Co-evaluation              AM-PAC PT 6 Clicks Mobility   Outcome Measure  Help needed turning from your back to your side while in a flat bed without using bedrails?: A Little Help needed moving from  lying on your back to sitting on the side of a flat bed without using bedrails?: A Little Help needed moving to and from a bed to a chair (including a wheelchair)?: A Little Help needed standing up from a chair using your arms (e.g., wheelchair or bedside chair)?: A Little Help needed to walk in hospital room?: A Little Help needed climbing 3-5 steps with a railing? : Total 6 Click Score: 16    End of Session Equipment Utilized During Treatment: Gait belt;Oxygen  Activity Tolerance: Patient tolerated treatment well;Other (comment) (limited by symptomatic orthostatic hypotension) Patient left: in bed;with call bell/phone within reach;with bed alarm set;with family/visitor present;Other (comment) (with bed in chair position and pt's BP stable) Nurse Communication: Mobility status;Other (comment) (pt's IV removed by NT due to it already being half out upon arrival and pt pouring blood from it; pt with symptomatic orthostatic  hypotension) PT Visit Diagnosis: Other abnormalities of gait and mobility (R26.89);Muscle weakness (generalized) (M62.81);Other symptoms and signs involving the nervous system (R29.898);Unsteadiness on feet (R26.81);Difficulty in walking, not elsewhere classified (R26.2)     Time: 8475-8388 PT Time Calculation (min) (ACUTE ONLY): 47 min  Charges:    $Gait Training: 8-22 mins $Therapeutic Activity: 23-37 mins PT General Charges $$ ACUTE PT VISIT: 1 Visit                     Theo Ferretti, PT, DPT Acute Rehabilitation Services  Office: [phone]    Theo CHRISTELLA Ferretti 10/29/2023, 5:47 PM

## 2023-10-29 NOTE — Plan of Care (Signed)
  Problem: Clinical Measurements: Goal: Respiratory complications will improve Outcome: Progressing Goal: Cardiovascular complication will be avoided Outcome: Progressing   Problem: Safety: Goal: Ability to remain free from injury will improve Outcome: Progressing   Problem: Respiratory: Goal: Ability to maintain adequate ventilation will improve Outcome: Progressing

## 2023-10-29 NOTE — Progress Notes (Signed)
 NAME:  [name], MRN:  [id number], DOB:  March 24, 1945, LOS: 3 ADMISSION DATE:  10/26/2023, CONSULTATION DATE:  10/26/23 REFERRING MD:  Dr Lanie Herald, CHIEF COMPLAINT:  AMS/ Weakness   History of Present Illness:  78 year old female patient with history of chronic hypoxic respiratory failure secondary to ILD, HFpEF, CAD, diabetes recent hospitalization for acute exacerbation of pulmonary fibrosis given supportive measures, steroids, antibiotics, bronchodilators discharged on Augmentin  and azithromycin  with steroid taper. Patient presenting with weakness, cough, more confusion per the family. In the ER, patient was persistently short of breath, chest film revealed left-sided airspace opacities.  Station started on antibiotics started on steroids.  Pulmonary asked to see this patient for evaluation for underlying ILD and ongoing comorbidities.  Pertinent  Medical History   Past Medical History:  Diagnosis Date   Asthmatic bronchitis    CHF (congestive heart failure) (HCC) dx'd 2016   Coronary atherosclerosis of native coronary artery    a. s/p CABG 2005 Dr Dusty     Hyperlipidemia    Hypertension    Hypertensive heart disease    Kidney stones    Mixed hyperlipidemia    Nephrolithiasis 06/07/2015   Paroxysmal atrial fibrillation (HCC)    a. s/p Maze in 2005 b. recurrence in March 2016 with >8sec posttermination pauses s/p PPM implant     Pneumonia 1950   double   Presence of permanent cardiac pacemaker    Restrictive lung disease 03/03/2018   Sleep apnea    tested; mask ordered; couldn't afford it; will get it now (09/04/2015)   TIA (transient ischemic attack)    Type II diabetes mellitus (HCC)    Uterine cancer [facility])    age 34 with partial hysterectomy   Significant Hospital Events: Including procedures, antibiotic start and stop dates in addition to other pertinent events   CT chest 10/21/2023-interstitial lung disease, groundglass changes  Interim History / Subjective:   Admitted for shortness of breath, interstitial lung disease, altered mental status Off BiPAP this morning Awake alert interactive States she still feels a little bit drowsy  Objective    Blood pressure (!) 146/64, pulse 60, temperature 97.7 F (36.5 C), temperature source Oral, resp. rate (!) 23, height 5' 3 (1.6 m), weight 94.2 kg, SpO2 98%.    FiO2 (%):  [40 %] 40 %   Intake/Output Summary (Last 24 hours) at 10/29/2023 0845 Last data filed at 10/29/2023 9367 Gross per 24 hour  Intake 200 ml  Output 2500 ml  Net -2300 ml   Filed Weights   10/26/23 1135  Weight: 94.2 kg    Examination: General: Chronically ill-appearing HENT: Moist oral mucosa Lungs: Decreased air movement bilaterally Cardiovascular: S1-S2 appreciated Abdomen: Soft, bowel sounds appreciated Extremities: No clubbing, no edema Neuro: Arousable, moving all extremities GU:   I reviewed last 24 h vitals and pain scores, last 48 h intake and output, last 24 h labs and trends, and last 24 h imaging results.  Resolved problem list   Assessment and Plan   Interstitial lung disease - Treated with course of antibiotics and steroids -Discharged on Augmentin  and azithromycin  - On tapering doses of steroids  Recent pneumonia Cough productive of green-yellow sputum normal procalcitonin, MRSA PCR negative Cultures negative to date Will suggest to stop antibiotics and monitor  Hypercapnic respiratory failure -Tolerating BiPAP well - Continue to taper steroids - Patient will benefit from NIV for home use as she responds very well to adequate ventilation and improvement in carbon dioxide levels  Chronic  hypoxic respiratory failure on 2 L of oxygen  - Was recently started on 4 L - Continue to wean down FiO2 as tolerated, maintain sats greater than 89%   Heart failure with preserved ejection fraction - Being diuresed  Encephalopathy is multifactorial, EEG negative, MRI negative for significant  findings  Coronary artery disease status post CABG in 2005  Paroxysmal atrial fibrillation - On Toprol   Diabetes Patient is DNR status  This patient requires non-invasive ventilator for acute in chronic respiratory failure. Without ventilator, there is significant risk of untimely readmission to the hospital and increase on CO2 retention. BIPAP is ineffective in the home setting in reducing CO2 due to the lack of an auto-rate.    TOC consultation for NIV

## 2023-10-29 NOTE — Progress Notes (Signed)
 [facility]  PROGRESS NOTE  [name] FMW:[id number] DOB: November 26, 1945 DOA: 10/26/2023 PCP: Larnell Hamilton, MD   Brief HPI:    78 y.o. 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  She was last hospitalized from 8/6-9 with an acute exacerbation of pulmonary fibrosis and responded well to IV Solumedrol, empiric antibiotics, nebulizers, supplemental O2. She was discharged on PO Augmentin  + Azithromycin  and a prednisone  taper.  She is currently on 4L home O2.    Presents with confusion and worsening somnolence.  Found to be hypercarbic and transferred to the progressive care unit and placed on BiPAP.  Pulmonary consulted and changed her back to oral prednisone .  Working her up for encephalopathy otherwise as below.  PT/OT recommending SNF now.     Assessment/Plan:   Hypersomnolence -Somnolence has improved after using BiPAP -Resolved - Likely in setting of hypercapnic respiratory failure, Peace due to elevated at 84 per VBG from 10/27/2023 - pCO2 improved to 75 with BiPAP; continue BiPAP as tolerated - Patient's mental status has improved today  Acute encephalopathy - Likely in setting of above - TSH 0.126, B12 1384, HIV nonreactive   Checked EEG and showed that the study was suggestive of moderate to severe diffuse encephalopathy with no seizures or epileptiform discharges seen throughout the recording.  -MRI brain showed no acute abnormality  Acute on chronic HFpEF -Appears fluid overload -BNP 323.5 - Patient takes Lasix  40 mg p.o. twice daily at home - She was given  Lasix  40 mg IV every 12 hours and assess diuretic response - Diuresed well with IV Lasix , will hold IV Lasix  - Start Lasix  40 mg p.o. twice daily  Recent pneumonia -Treated with antibiotics and steroids -Procalcitonin less than 0.10 -Currently on  cefepime  -Will switch to IV ceftriaxone     ILD Flare: Patient with recent hospitalization for ILD flare.  -She was treated with course of steroids and antibiotics - Currently on prednisone  -Continue ceftriaxone   Chronic HFpEF:  Lasix  restarted as above   CAD  S/p CABG: No c/o CP   Afib:  Has pacemaker and Rate controlled with Toprol  XL. C/w Anticoagulation w/ Apixaban    HLD: On Repatha  at home   DMType 2: A1c 7.4 Continue sliding scale insulin  with NovoLog  -CBG well-controlled  Normocytic anemia - CTM for S/Sx of Bleeding; No overt bleeding noted.  - Hemoglobin stable at 11.2    Class II Obesity: Complicates overall prognosis and care. Estimated body mass index is 36.79 kg/m as calculated from the following:   Height as of this encounter: 5' 3 (1.6 m).   Weight as of this encounter: 94.2 kg. Weight Loss and Dietary Counseling given    Medications     apixaban   5 mg Oral BID   benzonatate   100 mg Oral BID   docusate sodium   100 mg Oral BID   insulin  aspart  0-20 Units Subcutaneous TID WC   insulin  aspart  0-5 Units Subcutaneous QHS   ipratropium-albuterol   3 mL Nebulization BID   lactulose   20 g Oral BID   metoprolol  succinate  50 mg Oral Daily   predniSONE   10 mg Oral BID WC   sodium chloride  flush  3 mL Intravenous Q12H     Data Reviewed:   CBG:  Recent Labs  Lab 10/28/23 1343 10/28/23 1639 10/28/23 2122 10/29/23 0617 10/29/23 0737  GLUCAP 184* 214*  156* 170* 195*    SpO2: 97 % O2 Flow Rate (L/min): 2 L/min FiO2 (%): 40 %    Vitals:   10/29/23 0000 10/29/23 0005 10/29/23 0400 10/29/23 0739  BP:  (!) 141/59 (!) 164/63 (!) 146/64  Pulse: (!) 59 60 (!) 59 60  Resp: 18 18 (!) 22 (!) 23  Temp:  98.1 F (36.7 C) 97.6 F (36.4 C) 97.7 F (36.5 C)  TempSrc:  Axillary Axillary Oral  SpO2: 99% 98% 99% 97%  Weight:      Height:          Data Reviewed:  Basic Metabolic Panel: Recent Labs  Lab 10/23/23 0230 10/24/23 0237 10/26/23 1146  10/27/23 0251 10/27/23 1106 10/28/23 0358 10/29/23 0437  NA 138 138 140 136 131* 137 138  K 3.6 4.0 4.5 4.9 4.7 4.2 4.1  CL 97* 97* 99 99  --  97* 95*  CO2 30 31 27 30   --  31 35*  GLUCOSE 289* 234* 75 113*  --  165* 166*  BUN 37* 48* 39* 40*  --  42* 35*  CREATININE 1.48* 1.47* 1.02* 1.01*  --  1.08* 0.94  CALCIUM  9.5 9.4 9.1 8.8*  --  9.2 8.9  MG 2.1  --   --   --   --  2.4  --   PHOS  --   --   --   --   --  3.2  --     CBC: Recent Labs  Lab 10/24/23 0237 10/26/23 1146 10/27/23 0251 10/27/23 1106 10/28/23 0358 10/29/23 0437  WBC 15.9* 10.5 9.5  --  11.7* 8.3  NEUTROABS  --  7.6  --   --  9.3*  --   HGB 11.6* 11.9* 11.6* 11.6* 11.2* 10.9*  HCT 38.1 38.9 39.9 34.0* 36.4 35.5*  MCV 92.3 92.2 95.2  --  91.0 90.6  PLT 346 316 304  --  308 251    LFT Recent Labs  Lab 10/26/23 1146 10/27/23 1131 10/28/23 0358  AST 26 28 25   ALT 16 20 19   ALKPHOS 51 53 52  BILITOT 0.6 0.8 0.8  PROT 6.8 6.4* 6.3*  ALBUMIN 3.1* 2.9* 2.9*     Antibiotics: Anti-infectives (From admission, onward)    Start     Dose/Rate Route Frequency Ordered Stop   10/28/23 1245  ceFEPIme  (MAXIPIME ) 2 g in sodium chloride  0.9 % 100 mL IVPB        2 g 200 mL/hr over 30 Minutes Intravenous Every 12 hours 10/28/23 1150     10/27/23 1000  vancomycin  (VANCOREADY) IVPB 750 mg/150 mL  Status:  Discontinued        750 mg 150 mL/hr over 60 Minutes Intravenous Every 24 hours 10/26/23 1943 10/27/23 1324   10/27/23 0200  ceFEPIme  (MAXIPIME ) 2 g in sodium chloride  0.9 % 100 mL IVPB  Status:  Discontinued        2 g 200 mL/hr over 30 Minutes Intravenous Every 12 hours 10/26/23 1943 10/27/23 1810   10/26/23 1430  vancomycin  (VANCOCIN ) IVPB 1000 mg/200 mL premix  Status:  Discontinued        1,000 mg 200 mL/hr over 60 Minutes Intravenous  Once 10/26/23 1421 10/26/23 1424   10/26/23 1430  ceFEPIme  (MAXIPIME ) 2 g in sodium chloride  0.9 % 100 mL IVPB        2 g 200 mL/hr over 30 Minutes Intravenous  Once  10/26/23 1421 10/26/23 1504   10/26/23 1430  vancomycin  (  VANCOREADY) IVPB 1500 mg/300 mL        1,500 mg 150 mL/hr over 120 Minutes Intravenous  Once 10/26/23 1424 10/26/23 1750        DVT prophylaxis: Apixaban   Code Status: DNR  Family Communication:    CONSULTS    Subjective   Breathing has improved.  Diuresed well with IV Lasix .   Objective    Physical Examination:  Appears in no acute distress S1-S2, regular Lungs clear to auscultation bilaterally Abdomen is soft, nontender, no organomegaly   Status is: Inpatient:             Sabas GORMAN Brod   [facility] If 7PM-7AM, please contact night-coverage at [email or web address], Office  [phone]   10/29/2023, 7:45 AM  LOS: 3 days

## 2023-10-30 DIAGNOSIS — J849 Interstitial pulmonary disease, unspecified: Secondary | ICD-10-CM | POA: Diagnosis not present

## 2023-10-30 LAB — BASIC METABOLIC PANEL WITH GFR
Anion gap: 11 (ref 5–15)
BUN: 25 mg/dL — ABNORMAL HIGH (ref 8–23)
CO2: 37 mmol/L — ABNORMAL HIGH (ref 22–32)
Calcium: 9 mg/dL (ref 8.9–10.3)
Chloride: 95 mmol/L — ABNORMAL LOW (ref 98–111)
Creatinine, Ser: 0.83 mg/dL (ref 0.44–1.00)
GFR, Estimated: >60 mL/min (ref >60)
Glucose, Bld: 177 mg/dL — ABNORMAL HIGH (ref 70–99)
Potassium: 3.8 mmol/L (ref 3.5–5.1)
Sodium: 143 mmol/L (ref 135–145)

## 2023-10-30 LAB — GLUCOSE, CAPILLARY
Glucose-Capillary: 205 mg/dL — ABNORMAL HIGH (ref 70–99)
Glucose-Capillary: 215 mg/dL — ABNORMAL HIGH (ref 70–99)
Glucose-Capillary: 194 mg/dL — ABNORMAL HIGH (ref 70–99)
Glucose-Capillary: 224 mg/dL — ABNORMAL HIGH (ref 70–99)

## 2023-10-30 MED ORDER — MELATONIN 3 MG PO TABS
3.0000 mg | ORAL_TABLET | Freq: Every evening | ORAL | Status: DC | PRN
  Administered 2023-10-31 – 2023-11-05 (×2): 3 mg via ORAL
  Filled 2023-10-30 (×3): qty 1

## 2023-10-30 MED ORDER — OXYCODONE HCL 5 MG PO TABS
5.0000 mg | ORAL_TABLET | Freq: Four times a day (QID) | ORAL | Status: DC | PRN
  Filled 2023-10-30: qty 1

## 2023-10-30 NOTE — Progress Notes (Signed)
 Physical Therapy Treatment Patient Details Name: [name] MRN: [id number] DOB: August 19, 1945 Today's Date: 10/30/2023   History of Present Illness Pt is a 78 y/o female presenting 8/11 with AMS and progressive weakness. Recent admission 8/6-8/9 for ILD flare. PMH:  chronic resp failure on 2L, ILD, CHF, CAD s/p CABG, PAF, SSS s/p PPM, T2DM    PT Comments  The pt is making good, gradual progress as she was able to ambulate an increased distance of up to ~45 ft with minA and RW support this date before fatiguing. She is continuing to require cuing for hand placement with transfers along with minA to power up to stand due to noted muscular weakness in her legs. She remains at risk for falls with noted balance, strength, cognitive, and endurance deficits. Pt sitting up in chair eating lunch at end of session to try to promote her OOB activity tolerance. Her BP was more stable today, see below. Will continue to follow acutely.   SpO2 >/= 87% on 2L throughout  BP-  157/75 sitting 141/64 standing 153/68 sitting end of session     If plan is discharge home, recommend the following: Help with stairs or ramp for entrance;Assist for transportation;Direct supervision/assist for medications management;A little help with walking and/or transfers;A lot of help with bathing/dressing/bathroom;Assistance with cooking/housework;Direct supervision/assist for financial management;Supervision due to cognitive status   Can travel by private vehicle     Yes  Equipment Recommendations  None recommended by PT    Recommendations for Other Services       Precautions / Restrictions Precautions Precautions: Fall Recall of Precautions/Restrictions: Impaired Precaution/Restrictions Comments: monitor O2 (2-3 L O2 at baseline); watch BP (orthostatic 8/14, but not today) Restrictions Weight Bearing Restrictions Per Provider Order: No     Mobility  Bed Mobility Overal bed mobility: Needs Assistance Bed  Mobility: Supine to Sit     Supine to sit: Contact guard, HOB elevated, Used rails     General bed mobility comments: Extra time and cues for use of rails, pt able to ascend trunk and sit up L EOB from elevated HOB with CGA for safety.    Transfers Overall transfer level: Needs assistance Equipment used: Rolling walker (2 wheels) Transfers: Sit to/from Stand Sit to Stand: Min assist           General transfer comment: Cues needed for hand placement standing from EOB 1x and from recliner 1x, minA to power up to stand and gain balance each rep    Ambulation/Gait Ambulation/Gait assistance: Min assist Gait Distance (Feet): 45 Feet Assistive device: Rolling walker (2 wheels) Gait Pattern/deviations: Step-through pattern, Decreased stride length, Trunk flexed, Decreased step length - right, Decreased step length - left Gait velocity: reduced Gait velocity interpretation: <1.31 ft/sec, indicative of household ambulator   General Gait Details: Pt takes slow, small, mildly unsteady steps in the room, ambulating anteriorly <> posteriorly repeatedly until fatigue. VCs provided to increase bil step length with mod success noted. MinA for balance throughout.   Stairs             Wheelchair Mobility     Tilt Bed    Modified Rankin (Stroke Patients Only)       Balance Overall balance assessment: Needs assistance Sitting-balance support: No upper extremity supported, Feet supported Sitting balance-Leahy Scale: Fair Sitting balance - Comments: static sitting EOB with supervision for safety   Standing balance support: Bilateral upper extremity supported, During functional activity, Reliant on assistive device for balance Standing balance-Leahy Scale:  Poor Standing balance comment: Reliant on RW to ambulate                            Communication Communication Communication: Impaired Factors Affecting Communication: Hearing impaired  Cognition Arousal:  Alert Behavior During Therapy: WFL for tasks assessed/performed   PT - Cognitive impairments: Problem solving, Safety/Judgement, Sequencing, Attention, Awareness, Memory                       PT - Cognition Comments: Delayed processing. Needs cues to sequence tasks/mobility. Following commands: Impaired Following commands impaired: Follows one step commands with increased time, Follows multi-step commands with increased time, Follows multi-step commands inconsistently    Cueing Cueing Techniques: Verbal cues, Tactile cues  Exercises      General Comments General comments (skin integrity, edema, etc.): SpO2 >/= 87% on 2L throughout; BP 157/75 sitting, 141/64 standing, 153/68 sitting end of session      Pertinent Vitals/Pain Pain Assessment Pain Assessment: Faces Faces Pain Scale: Hurts little more Pain Location: buttocks Pain Descriptors / Indicators: Discomfort, Sore Pain Intervention(s): Limited activity within patient's tolerance, Monitored during session, Repositioned, Other (comment) (placed pillows under buttocks in chair)    Home Living                          Prior Function            PT Goals (current goals can now be found in the care plan section) Acute Rehab PT Goals Patient Stated Goal: get back to normal PT Goal Formulation: With patient/family Time For Goal Achievement: 11/10/23 Potential to Achieve Goals: Fair Progress towards PT goals: Progressing toward goals    Frequency    Min 2X/week      PT Plan      Co-evaluation              AM-PAC PT 6 Clicks Mobility   Outcome Measure  Help needed turning from your back to your side while in a flat bed without using bedrails?: A Little Help needed moving from lying on your back to sitting on the side of a flat bed without using bedrails?: A Little Help needed moving to and from a bed to a chair (including a wheelchair)?: A Little Help needed standing up from a chair using  your arms (e.g., wheelchair or bedside chair)?: A Little Help needed to walk in hospital room?: A Little Help needed climbing 3-5 steps with a railing? : Total 6 Click Score: 16    End of Session Equipment Utilized During Treatment: Gait belt;Oxygen  Activity Tolerance: Patient tolerated treatment well Patient left: with call bell/phone within reach;with family/visitor present;in chair Nurse Communication: Mobility status (no chair alarm on; pt and family present confirmed that pt would not get up alone and would call for nursing to get up) PT Visit Diagnosis: Other abnormalities of gait and mobility (R26.89);Muscle weakness (generalized) (M62.81);Other symptoms and signs involving the nervous system (R29.898);Unsteadiness on feet (R26.81);Difficulty in walking, not elsewhere classified (R26.2)     Time: 8794-8761 PT Time Calculation (min) (ACUTE ONLY): 33 min  Charges:    $Gait Training: 8-22 mins $Therapeutic Activity: 8-22 mins PT General Charges $$ ACUTE PT VISIT: 1 Visit                     Theo Ferretti, PT, DPT Acute Rehabilitation Services  Office: [phone]  Theo CHRISTELLA Ferretti 10/30/2023, 1:02 PM

## 2023-10-30 NOTE — Inpatient Diabetes Management (Signed)
 Inpatient Diabetes Program Recommendations  AACE/ADA: New Consensus Statement on Inpatient Glycemic Control (2015)  Target Ranges:  Prepandial:   less than 140 mg/dL      Peak postprandial:   less than 180 mg/dL (1-2 hours)      Critically ill patients:  140 - 180 mg/dL   Lab Results  Component Value Date   GLUCAP 205 (H) 10/30/2023   HGBA1C 7.4 (H) 10/21/2023    Review of Glycemic Control  Latest Reference Range & Units 10/29/23 07:37 10/29/23 11:35 10/29/23 15:55 10/29/23 21:13 10/30/23 08:09  Glucose-Capillary 70 - 99 mg/dL 804 (H) 792 (H) 746 (H) 132 (H) 205 (H)  (H): Data is abnormally high  Diabetes history: DM2 Outpatient Diabetes medications: 75/25 40 units TID, Farxiga 10 mg QD Current orders for Inpatient glycemic control: Novolog  0-20 units TID and 0-5 units at bedtime, Prednisone  10 mg BID  Inpatient Diabetes Program Recommendations:    Might consider:  Semglee  15 units every day  Thank you, Wyvonna Pinal, MSN, CDCES Diabetes Coordinator Inpatient Diabetes Program [phone] (team pager from 8a-5p)

## 2023-10-30 NOTE — Progress Notes (Signed)
 Occupational Therapy Treatment Patient Details Name: [name] MRN: [id number] DOB: October 25, 1945 Today's Date: 10/30/2023   History of present illness Pt is a 78 y/o female presenting 8/11 with AMS and progressive weakness. Recent admission 8/6-8/9 for ILD flare. PMH:  chronic resp failure on 2L, ILD, CHF, CAD s/p CABG, PAF, SSS s/p PPM, T2DM   OT comments  Pt. Seen for skilled OT treatment session. Pt. Set up for ub bathing and grooming.  Min A for ub dressing. In standing CGA for portions of peri care/bathing. MOD A for buttocks portions.  No LOB while in standing.  Able to state when rest break needed without cues.  Son present beg. And end of session and vocalizing her progress and improvements from previous days.  Cont. With acute OT POC.        If plan is discharge home, recommend the following:  A lot of help with walking and/or transfers;Two people to help with walking and/or transfers;A lot of help with bathing/dressing/bathroom   Equipment Recommendations  Other (comment)    Recommendations for Other Services      Precautions / Restrictions Precautions Precautions: Fall Recall of Precautions/Restrictions: Impaired Precaution/Restrictions Comments: monitor O2 (2-3 L O2 at baseline); watch BP (orthostatic 8/14, but not today)       Mobility Bed Mobility               General bed mobility comments: seated in recliner beginning and end of session    Transfers Overall transfer level: Needs assistance Equipment used: Rolling walker (2 wheels) Transfers: Sit to/from Stand Sit to Stand: Contact guard assist           General transfer comment: cues for hand placement for sit/stand from recliner.  able to stand for duration of peri care. no lob noted and able to appropriately state when rest break needed     Balance                                           ADL either performed or assessed with clinical judgement   ADL Overall ADL's :  Needs assistance/impaired     Grooming: Wash/dry hands;Wash/dry face;Applying deodorant;Sitting;Set up   Upper Body Bathing: Minimal assistance;Sitting Upper Body Bathing Details (indicate cue type and reason): for back Lower Body Bathing: Moderate assistance;Sit to/from stand;Set up;Contact guard assist Lower Body Bathing Details (indicate cue type and reason): max a for buttocks, pt. able to reach front areas in standing CGA Upper Body Dressing : Minimal assistance;Sitting Upper Body Dressing Details (indicate cue type and reason): tie gown and re thread lines                   General ADL Comments: pt. able to stand for duration of LB bathing of peri areas    Extremity/Trunk Assessment              Vision       Perception     Praxis     Communication Communication Communication: Impaired Factors Affecting Communication: Hearing impaired   Cognition Arousal: Alert Behavior During Therapy: WFL for tasks assessed/performed                                 Following commands: Impaired Following commands impaired: Follows one step commands with increased time, Follows multi-step  commands with increased time, Follows multi-step commands inconsistently      Cueing   Cueing Techniques: Verbal cues, Tactile cues  Exercises      Shoulder Instructions       General Comments      Pertinent Vitals/ Pain       Pain Assessment Pain Assessment: No/denies pain  Home Living                                          Prior Functioning/Environment              Frequency  Min 2X/week        Progress Toward Goals  OT Goals(current goals can now be found in the care plan section)  Progress towards OT goals: Progressing toward goals     Plan      Co-evaluation                 AM-PAC OT 6 Clicks Daily Activity     Outcome Measure   Help from another person eating meals?: A Little Help from another person  taking care of personal grooming?: A Lot Help from another person toileting, which includes using toliet, bedpan, or urinal?: A Lot Help from another person bathing (including washing, rinsing, drying)?: A Lot Help from another person to put on and taking off regular upper body clothing?: A Lot Help from another person to put on and taking off regular lower body clothing?: A Lot 6 Click Score: 13    End of Session    OT Visit Diagnosis: Unsteadiness on feet (R26.81);Other abnormalities of gait and mobility (R26.89);Muscle weakness (generalized) (M62.81);Other symptoms and signs involving cognitive function   Activity Tolerance Patient tolerated treatment well   Patient Left in chair;with call bell/phone within reach;with family/visitor present;with nursing/sitter in room   Nurse Communication          Time: 8581-8557 OT Time Calculation (min): 24 min  Charges: OT General Charges $OT Visit: 1 Visit OT Treatments $Self Care/Home Management : 23-37 mins  Randall, COTA/L Acute Rehabilitation [phone]   CHRISTELLA Nest Lorraine-COTA/L 10/30/2023, 3:57 PM

## 2023-10-30 NOTE — Progress Notes (Signed)
 PROGRESS NOTE    [name]  FMW:[id number] DOB: 12/20/1945 DOA: 10/26/2023 PCP: Larnell Hamilton, MD   Brief Narrative:78 y.o. 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  She was last hospitalized from 8/6-9 with an acute exacerbation of pulmonary fibrosis and responded well to IV Solumedrol, empiric antibiotics, nebulizers, supplemental O2. She was discharged on PO Augmentin  + Azithromycin  and a prednisone  taper.  She is currently on 4L home O2.    Presents with confusion and worsening somnolence.  Found to be hypercarbic and transferred to the progressive care unit and placed on BiPAP.  Pulmonary consulted and changed her back to oral prednisone .  Working her up for encephalopathy otherwise as below.  PT/OT recommending SNF now.  Assessment & Plan:   Principal Problem:   ILD (interstitial lung disease) (HCC) Active Problems:   Pacemaker- March 2016    Hx of CABG 2005   Paroxysmal atrial fibrillation (HCC)   Class 2 obesity due to excess calories with body mass index (BMI) of 36.0 to 36.9 in adult   Mixed hyperlipidemia   Poorly controlled diabetes mellitus (HCC)   Hypertension   Chronic diastolic CHF (congestive heart failure) (HCC)   Healthcare-associated pneumonia  Hypersomnolence -Somnolence has improved after using BiPAP -Resolved - Likely in setting of hypercapnic respiratory failure -continue BiPAP as tolerated - Patient's mental status has improved today-did not sleep well overnight as BiPAP was beeping and had to be taken off.  Drowsy today but appropriate and able to answer questions appropriate.   Acute encephalopathy - Likely in setting of above - TSH 0.126, B12 1384, HIV nonreactive   Checked EEG and showed that the study was suggestive of moderate to severe diffuse encephalopathy with no seizures or epileptiform  discharges seen throughout the recording.  -MRI brain showed no acute abnormality   Acute on chronic HFpEF -Appears fluid overload -BNP 323.5 - Patient takes Lasix  40 mg p.o. twice daily at home - She was given  Lasix  40 mg IV every 12 hours and assess diuretic response - Diuresed well with IV Lasix , will hold IV Lasix  - Continue Lasix  40 mg p.o. twice daily   Recent pneumonia -Treated with antibiotics and steroids -Procalcitonin less than 0.10 Will stop antibiotics and monitor 8/15   ILD Flare: Patient with recent hospitalization for ILD flare.  -She was treated with course of steroids and antibiotics - Currently on prednisone    Chronic HFpEF:  Lasix  restarted as above   CAD  S/p CABG: No c/o CP    Afib:  Has pacemaker and Rate controlled with Toprol  XL. C/w Anticoagulation w/ Apixaban    HLD: On Repatha  at home   DMType 2: A1c 7.4 Continue sliding scale insulin  with NovoLog  -CBG well-controlled   Normocytic anemia - CTM for S/Sx of Bleeding; No overt bleeding noted.  - Hemoglobin stable at 11.2    Class II Obesity: Complicates overall prognosis and care. Estimated body mass index is 36.79 kg/m as calculated from the following:   Height as of this encounter: 5' 3 (1.6 m).   Weight as of this encounter: 94.2 kg. Weight Loss and Dietary Counseling given    Estimated body mass index is 36.79 kg/m as calculated from the following:   Height as of this encounter: 5' 3 (1.6 m).   Weight as of this encounter: 94.2 kg.  DVT prophylaxis: Eliquis  Code Status:  DNR Family Communication: Daughter daughter at bedside  disposition Plan:  Status is: Inpatient Remains inpatient appropriate because: Acute illness   Consultants:  PCCM    Subjective: Did not use BiPAP overnight for the whole night since it was beeping and was taken off did not sleep well so more drowsy this morning daughter at bedside  Objective: Vitals:   10/29/23 1555 10/29/23 1935 10/30/23 0425  10/30/23 0936  BP: (!) 114/55 (!) 171/73 (!) 159/74 (!) 140/57  Pulse: 64 60  60  Resp: (!) 22 20 20    Temp: 98.4 F (36.9 C) 98.3 F (36.8 C) 97.7 F (36.5 C)   TempSrc: Oral Oral Axillary   SpO2: 95% 93%    Weight:      Height:        Intake/Output Summary (Last 24 hours) at 10/30/2023 1145 Last data filed at 10/30/2023 0900 Gross per 24 hour  Intake 280 ml  Output 800 ml  Net -520 ml   Filed Weights   10/26/23 1135  Weight: 94.2 kg    Examination:  General exam: Appears chronically ill-appearing Respiratory system: Diminished breath sounds tachypneic Cardiovascular system: Regular  gastrointestinal system: Abdomen is nondistended, soft and nontender. No organomegaly or masses felt. Normal bowel sounds heard. Central nervous system: Drowsy but able to respond appropriately alert and oriented. No focal neurological deficits. Extremities: Trace edema   Data Reviewed: I have personally reviewed following labs and imaging studies  CBC: Recent Labs  Lab 10/24/23 0237 10/26/23 1146 10/27/23 0251 10/27/23 1106 10/28/23 0358 10/29/23 0437  WBC 15.9* 10.5 9.5  --  11.7* 8.3  NEUTROABS  --  7.6  --   --  9.3*  --   HGB 11.6* 11.9* 11.6* 11.6* 11.2* 10.9*  HCT 38.1 38.9 39.9 34.0* 36.4 35.5*  MCV 92.3 92.2 95.2  --  91.0 90.6  PLT 346 316 304  --  308 251   Basic Metabolic Panel: Recent Labs  Lab 10/26/23 1146 10/27/23 0251 10/27/23 1106 10/28/23 0358 10/29/23 0437 10/30/23 0455  NA 140 136 131* 137 138 143  K 4.5 4.9 4.7 4.2 4.1 3.8  CL 99 99  --  97* 95* 95*  CO2 27 30  --  31 35* 37*  GLUCOSE 75 113*  --  165* 166* 177*  BUN 39* 40*  --  42* 35* 25*  CREATININE 1.02* 1.01*  --  1.08* 0.94 0.83  CALCIUM  9.1 8.8*  --  9.2 8.9 9.0  MG  --   --   --  2.4  --   --   PHOS  --   --   --  3.2  --   --    GFR: Estimated Creatinine Clearance: 61.9 mL/min (by C-G formula based on SCr of 0.83 mg/dL). Liver Function Tests: Recent Labs  Lab 10/26/23 1146  10/27/23 1131 10/28/23 0358  AST 26 28 25   ALT 16 20 19   ALKPHOS 51 53 52  BILITOT 0.6 0.8 0.8  PROT 6.8 6.4* 6.3*  ALBUMIN 3.1* 2.9* 2.9*   No results for input(s): LIPASE, AMYLASE in the last 168 hours. Recent Labs  Lab 10/26/23 2153 10/27/23 1131  AMMONIA 40* 33   Coagulation Profile: No results for input(s): INR, PROTIME in the last 168 hours. Cardiac Enzymes: No results for input(s): CKTOTAL, CKMB, CKMBINDEX, TROPONINI in the last 168 hours. BNP (last 3 results) No results for input(s): PROBNP in the last 8760 hours. HbA1C: No results for input(s): HGBA1C in the last  72 hours. CBG: Recent Labs  Lab 10/29/23 1135 10/29/23 1555 10/29/23 2113 10/30/23 0809 10/30/23 1120  GLUCAP 207* 253* 132* 205* 215*   Lipid Profile: No results for input(s): CHOL, HDL, LDLCALC, TRIG, CHOLHDL, LDLDIRECT in the last 72 hours. Thyroid  Function Tests: No results for input(s): TSH, T4TOTAL, FREET4, T3FREE, THYROIDAB in the last 72 hours. Anemia Panel: No results for input(s): VITAMINB12, FOLATE, FERRITIN, TIBC, IRON, RETICCTPCT in the last 72 hours. Sepsis Labs: Recent Labs  Lab 10/26/23 1321 10/26/23 1858  PROCALCITON  --  <0.10  LATICACIDVEN 2.0*  --     Recent Results (from the past 240 hours)  Resp panel by RT-PCR (RSV, Flu A&B, Covid) Anterior Nasal Swab     Status: None   Collection Time: 10/21/23  4:23 PM   Specimen: Anterior Nasal Swab  Result Value Ref Range Status   SARS Coronavirus 2 by RT PCR NEGATIVE NEGATIVE Final   Influenza A by PCR NEGATIVE NEGATIVE Final   Influenza B by PCR NEGATIVE NEGATIVE Final    Comment: (NOTE) The Xpert Xpress SARS-CoV-2/FLU/RSV plus assay is intended as an aid in the diagnosis of influenza from Nasopharyngeal swab specimens and should not be used as a sole basis for treatment. Nasal washings and aspirates are unacceptable for Xpert Xpress SARS-CoV-2/FLU/RSV testing.  Fact  Sheet for Patients: BloggerCourse.com  Fact Sheet for Healthcare Providers: SeriousBroker.it  This test is not yet approved or cleared by the [place]  FDA and has been authorized for detection and/or diagnosis of SARS-CoV-2 by FDA under an Emergency Use Authorization (EUA). This EUA will remain in effect (meaning this test can be used) for the duration of the COVID-19 declaration under Section 564(b)(1) of the Act, 21 U.S.C. section 360bbb-3(b)(1), unless the authorization is terminated or revoked.     Resp Syncytial Virus by PCR NEGATIVE NEGATIVE Final    Comment: (NOTE) Fact Sheet for Patients: BloggerCourse.com  Fact Sheet for Healthcare Providers: SeriousBroker.it  This test is not yet approved or cleared by the [place]  FDA and has been authorized for detection and/or diagnosis of SARS-CoV-2 by FDA under an Emergency Use Authorization (EUA). This EUA will remain in effect (meaning this test can be used) for the duration of the COVID-19 declaration under Section 564(b)(1) of the Act, 21 U.S.C. section 360bbb-3(b)(1), unless the authorization is terminated or revoked.  Performed at [facility], [address] [place], [place] [address]   Culture, blood (routine x 2)     Status: None (Preliminary result)   Collection Time: 10/26/23 12:00 PM   Specimen: BLOOD LEFT ARM  Result Value Ref Range Status   Specimen Description BLOOD LEFT ARM  Final   Special Requests   Final    BOTTLES DRAWN AEROBIC AND ANAEROBIC Blood Culture results may not be optimal due to an inadequate volume of blood received in culture bottles   Culture   Final    NO GROWTH 4 DAYS Performed at [facility], [address], [place], [place] [address]    Report Status PENDING  Incomplete  Culture, blood (routine x 2)     Status: None (Preliminary result)   Collection Time: 10/26/23  12:07 PM   Specimen: BLOOD RIGHT ARM  Result Value Ref Range Status   Specimen Description BLOOD RIGHT ARM  Final   Special Requests   Final    BOTTLES DRAWN AEROBIC ONLY Blood Culture results may not be optimal due to an inadequate volume of blood received in culture  bottles   Culture   Final    NO GROWTH 4 DAYS Performed at [facility], [address] [place], [place] [address]    Report Status PENDING  Incomplete  Resp panel by RT-PCR (RSV, Flu A&B, Covid) Anterior Nasal Swab     Status: None   Collection Time: 10/26/23 12:12 PM   Specimen: Anterior Nasal Swab  Result Value Ref Range Status   SARS Coronavirus 2 by RT PCR NEGATIVE NEGATIVE Final   Influenza A by PCR NEGATIVE NEGATIVE Final   Influenza B by PCR NEGATIVE NEGATIVE Final    Comment: (NOTE) The Xpert Xpress SARS-CoV-2/FLU/RSV plus assay is intended as an aid in the diagnosis of influenza from Nasopharyngeal swab specimens and should not be used as a sole basis for treatment. Nasal washings and aspirates are unacceptable for Xpert Xpress SARS-CoV-2/FLU/RSV testing.  Fact Sheet for Patients: BloggerCourse.com  Fact Sheet for Healthcare Providers: SeriousBroker.it  This test is not yet approved or cleared by the [place]  FDA and has been authorized for detection and/or diagnosis of SARS-CoV-2 by FDA under an Emergency Use Authorization (EUA). This EUA will remain in effect (meaning this test can be used) for the duration of the COVID-19 declaration under Section 564(b)(1) of the Act, 21 U.S.C. section 360bbb-3(b)(1), unless the authorization is terminated or revoked.     Resp Syncytial Virus by PCR NEGATIVE NEGATIVE Final    Comment: (NOTE) Fact Sheet for Patients: BloggerCourse.com  Fact Sheet for Healthcare Providers: SeriousBroker.it  This test is not yet approved or cleared by the [place]   FDA and has been authorized for detection and/or diagnosis of SARS-CoV-2 by FDA under an Emergency Use Authorization (EUA). This EUA will remain in effect (meaning this test can be used) for the duration of the COVID-19 declaration under Section 564(b)(1) of the Act, 21 U.S.C. section 360bbb-3(b)(1), unless the authorization is terminated or revoked.  Performed at [facility], [address], [place], [place] [address]   MRSA Next Gen by PCR, Nasal     Status: None   Collection Time: 10/26/23  8:00 PM   Specimen: Nasal Mucosa; Nasal Swab  Result Value Ref Range Status   MRSA by PCR Next Gen NOT DETECTED NOT DETECTED Final    Comment: (NOTE) The GeneXpert MRSA Assay (FDA approved for NASAL specimens only), is one component of a comprehensive MRSA colonization surveillance program. It is not intended to diagnose MRSA infection nor to guide or monitor treatment for MRSA infections. Test performance is not FDA approved in patients less than 32 years old. Performed at [facility], [address] [place], [place] [address]          Radiology Studies: MR BRAIN WO CONTRAST Result Date: 10/28/2023 CLINICAL DATA:  Initial evaluation for acute mental status change. EXAM: MRI HEAD WITHOUT CONTRAST TECHNIQUE: Multiplanar, multiecho pulse sequences of the brain and surrounding structures were obtained without intravenous contrast. COMPARISON:  CT from 10/26/2023 FINDINGS: Brain: Cerebral volume within normal limits. Mild patchy T2/FLAIR hyperintensity noted involving the supratentorial cerebral white matter, most likely related chronic microvascular ischemic disease, less than is typically seen for age. No abnormal foci of restricted diffusion to suggest acute or subacute ischemia. Nor is of chronic cortical infarction. No acute intracranial hemorrhage. Few small chronic micro hemorrhages noted involving the left frontal lobe and left cerebellum. No mass lesion, midline shift or  mass effect no hydrocephalus or extra-axial fluid collection. Pituitary gland within normal limits. Vascular: Major  intracranial vascular flow voids are maintained. Skull and upper cervical spine: Craniocervical junction within normal limits. Bone marrow signal intensity normal. No scalp soft tissue abnormality. Hyperostosis frontalis interna noted. Sinuses/Orbits: Prior bilateral ocular lens replacement. Paranasal sinuses are largely clear. No significant mastoid effusion. Other: None. IMPRESSION: 1. No acute intracranial abnormality. 2. Mild chronic microvascular ischemic disease for age. Electronically Signed   By: Morene Hoard M.D.   On: 10/28/2023 19:11    Scheduled Meds:  apixaban   5 mg Oral BID   benzonatate   100 mg Oral BID   docusate sodium   100 mg Oral BID   furosemide   40 mg Oral BID   insulin  aspart  0-20 Units Subcutaneous TID WC   insulin  aspart  0-5 Units Subcutaneous QHS   ipratropium-albuterol   3 mL Nebulization BID   lactulose   20 g Oral BID   metoprolol  succinate  50 mg Oral Daily   predniSONE   10 mg Oral BID WC   sodium chloride  flush  3 mL Intravenous Q12H   Continuous Infusions:  cefTRIAXone  (ROCEPHIN )  IV Stopped (10/29/23 2339)     LOS: 4 days    Almarie KANDICE Hoots, MD  10/30/2023, 11:45 AM

## 2023-10-31 DIAGNOSIS — E66812 Obesity, class 2: Secondary | ICD-10-CM | POA: Diagnosis not present

## 2023-10-31 DIAGNOSIS — J9621 Acute and chronic respiratory failure with hypoxia: Secondary | ICD-10-CM | POA: Diagnosis not present

## 2023-10-31 DIAGNOSIS — J9622 Acute and chronic respiratory failure with hypercapnia: Secondary | ICD-10-CM | POA: Diagnosis not present

## 2023-10-31 DIAGNOSIS — I5032 Chronic diastolic (congestive) heart failure: Secondary | ICD-10-CM | POA: Diagnosis not present

## 2023-10-31 DIAGNOSIS — Z950 Presence of cardiac pacemaker: Secondary | ICD-10-CM | POA: Diagnosis not present

## 2023-10-31 DIAGNOSIS — J849 Interstitial pulmonary disease, unspecified: Secondary | ICD-10-CM | POA: Diagnosis not present

## 2023-10-31 LAB — GLUCOSE, CAPILLARY
Glucose-Capillary: 194 mg/dL — ABNORMAL HIGH (ref 70–99)
Glucose-Capillary: 150 mg/dL — ABNORMAL HIGH (ref 70–99)
Glucose-Capillary: 232 mg/dL — ABNORMAL HIGH (ref 70–99)
Glucose-Capillary: 216 mg/dL — ABNORMAL HIGH (ref 70–99)
Glucose-Capillary: 227 mg/dL — ABNORMAL HIGH (ref 70–99)

## 2023-10-31 LAB — CULTURE, BLOOD (ROUTINE X 2)
Culture: NO GROWTH
Culture: NO GROWTH

## 2023-10-31 NOTE — Progress Notes (Signed)
   NAME:  [name], MRN:  [id number], DOB:  18-Sep-1945, LOS: 5 ADMISSION DATE:  10/26/2023, CONSULTATION DATE:  10/26/23 REFERRING MD:  Dr Lanie Herald, CHIEF COMPLAINT:  AMS/ Weakness   History of Present Illness:  78 year old female patient with history of chronic hypoxic respiratory failure secondary to ILD, HFpEF, CAD, diabetes recent hospitalization for acute exacerbation of pulmonary fibrosis given supportive measures, steroids, antibiotics, bronchodilators discharged on Augmentin  and azithromycin  with steroid taper. Patient presenting with weakness, cough, more confusion per the family. In the ER, patient was persistently short of breath, chest film revealed left-sided airspace opacities.  Station started on antibiotics started on steroids.  Pulmonary asked to see this patient for evaluation for underlying ILD and ongoing comorbidities.  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   Significant Hospital Events: Including procedures, antibiotic start and stop dates in addition to other pertinent events   CT chest 10/21/2023-interstitial lung disease, groundglass changes  Interim History / Subjective:  Using Bipap at night No complaints today AM  Objective    Blood pressure (!) 146/58, pulse (!) 59, temperature (!) 96.1 F (35.6 C), temperature source Axillary, resp. rate 18, height 5' 3 (1.6 m), weight 94.2 kg, SpO2 100%.    Vent Mode: BIPAP FiO2 (%):  [40 %] 40 % Set Rate:  [16  bmp] 16 bmp PEEP:  [5 cmH20] 5 cmH20   Intake/Output Summary (Last 24 hours) at 10/31/2023 1103 Last data filed at 10/31/2023 9141 Gross per 24 hour  Intake 180 ml  Output 1950 ml  Net -1770 ml   Filed Weights   10/26/23 1135  Weight: 94.2 kg    Examination: Gen:      No acute distress, chronically ill appearing HEENT:  EOMI, sclera anicteric Neck:     No masses; no thyromegaly Lungs:    Clear to auscultation bilaterally; normal respiratory effort CV:         Regular rate and rhythm; no murmurs Abd:      + bowel sounds; soft, non-tender; no palpable masses, no distension Ext:    No edema; adequate peripheral perfusion Neuro: alert and oriented x 3 Psych: normal mood and affect   Resolved problem list   Assessment and Plan  Acute on chronic hypoxic hypercarbic respiratory failure Has history of unspecified diffuse parenchymal interstitial lung disease, 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  Treatment discharged with BiPAP as she would benefit from NIV Diuresis as tolerated Not much to add from pulmonary standpoint. Follow-up in clinic with Dr. Annella Will check back on her next week if she is still inpatient.  Krysia Zahradnik MD [facility] See Amion for pager  If no response to pager , please call [phone] until 7pm After 7:00 pm call Elink  [phone] 10/31/2023, 11:08 AM

## 2023-10-31 NOTE — Plan of Care (Signed)
  Problem: Education: Goal: Knowledge of General Education information will improve Description: Including pain rating scale, medication(s)/side effects and non-pharmacologic comfort measures Outcome: Progressing   Problem: Clinical Measurements: Goal: Ability to maintain clinical measurements within normal limits will improve Outcome: Progressing Goal: Will remain free from infection Outcome: Progressing Goal: Diagnostic test results will improve Outcome: Progressing Goal: Respiratory complications will improve Outcome: Progressing Goal: Cardiovascular complication will be avoided Outcome: Progressing   Problem: Activity: Goal: Risk for activity intolerance will decrease Outcome: Progressing   Problem: Coping: Goal: Level of anxiety will decrease Outcome: Progressing   Problem: Pain Managment: Goal: General experience of comfort will improve and/or be controlled Outcome: Progressing

## 2023-10-31 NOTE — Progress Notes (Signed)
 [facility]  PROGRESS NOTE  [name] FMW:[id number] DOB: 1945-05-03 DOA: 10/26/2023 PCP: Larnell Hamilton, MD   Brief HPI:    78 y.o. 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  She was last hospitalized from 8/6-9 with an acute exacerbation of pulmonary fibrosis and responded well to IV Solumedrol, empiric antibiotics, nebulizers, supplemental O2. She was discharged on PO Augmentin  + Azithromycin  and a prednisone  taper.  She is currently on 4L home O2.    Presents with confusion and worsening somnolence.  Found to be hypercarbic and transferred to the progressive care unit and placed on BiPAP.  Pulmonary consulted and changed her back to oral prednisone .  Working her up for encephalopathy otherwise as below.  PT/OT recommending SNF now.     Assessment/Plan:   Hypersomnolence -Somnolence has improved after using BiPAP -Resolved - Likely in setting of hypercapnic respiratory failure, Peace due to elevated at 84 per VBG from 10/27/2023 - pCO2 improved to 75 with BiPAP; continue BiPAP as tolerated - Patient is more alert and awake.  Answering question appropriately.  Acute encephalopathy - Likely in setting of above - TSH 0.126, B12 1384, HIV nonreactive   Checked EEG and showed that the study was suggestive of moderate to severe diffuse encephalopathy with no seizures or epileptiform discharges seen throughout the recording.  -MRI brain showed no acute abnormality  Acute on chronic HFpEF -Appears fluid overload -BNP 323.5 - Patient takes Lasix  40 mg p.o. twice daily at home - She was given  Lasix  40 mg IV every 12 hours and assess diuretic response - Diuresed well with IV Lasix , will hold IV Lasix  - Continue Lasix  40 mg p.o. twice daily  Recent pneumonia -Treated with antibiotics and steroids -Procalcitonin less than  0.10 -Currently on cefepime  - IV antibiotics stopped on 10/30/2023   ILD Flare: Patient with recent hospitalization for ILD flare.  -She was treated with course of steroids and antibiotics - Currently on prednisone    Chronic HFpEF:  Lasix  restarted as above   CAD  S/p CABG: No c/o CP   Afib:  Has pacemaker and Rate controlled with Toprol  XL. C/w Anticoagulation w/ Apixaban    HLD: On Repatha  at home   DMType 2: A1c 7.4 Continue sliding scale insulin  with NovoLog  -CBG well-controlled  Normocytic anemia - CTM for S/Sx of Bleeding; No overt bleeding noted.  - Hemoglobin stable at 11.2    Class II Obesity: Complicates overall prognosis and care. Estimated body mass index is 36.79 kg/m as calculated from the following:   Height as of this encounter: 5' 3 (1.6 m).   Weight as of this encounter: 94.2 kg. Weight Loss and Dietary Counseling given    Medications     apixaban   5 mg Oral BID   benzonatate   100 mg Oral BID   docusate sodium   100 mg Oral BID   furosemide   40 mg Oral BID   insulin  aspart  0-20 Units Subcutaneous TID WC   insulin  aspart  0-5 Units Subcutaneous QHS   ipratropium-albuterol   3 mL Nebulization BID   lactulose   20 g Oral BID   metoprolol  succinate  50 mg Oral Daily   predniSONE   10 mg Oral BID WC   sodium chloride  flush  3 mL Intravenous Q12H     Data Reviewed:   CBG:  Recent Labs  Lab 10/30/23 0809 10/30/23  1120 10/30/23 1507 10/30/23 2119 10/31/23 0230  GLUCAP 205* 215* 194* 224* 194*    SpO2: 100 % O2 Flow Rate (L/min): 2 L/min FiO2 (%): 40 %    Vitals:   10/30/23 1522 10/30/23 1921 10/30/23 2358 10/31/23 0406  BP: (!) 156/71 (!) 165/66 (!) 178/76 (!) 153/69  Pulse:  (!) 55 61 60  Resp: 18 (!) 23 18 16   Temp: 97.7 F (36.5 C) 97.9 F (36.6 C) 97.9 F (36.6 C) (!) 97.1 F (36.2 C)  TempSrc: Oral Oral Oral Oral  SpO2:  100% 96% 100%  Weight:      Height:          Data Reviewed:  Basic Metabolic Panel: Recent Labs   Lab 10/26/23 1146 10/27/23 0251 10/27/23 1106 10/28/23 0358 10/29/23 0437 10/30/23 0455  NA 140 136 131* 137 138 143  K 4.5 4.9 4.7 4.2 4.1 3.8  CL 99 99  --  97* 95* 95*  CO2 27 30  --  31 35* 37*  GLUCOSE 75 113*  --  165* 166* 177*  BUN 39* 40*  --  42* 35* 25*  CREATININE 1.02* 1.01*  --  1.08* 0.94 0.83  CALCIUM  9.1 8.8*  --  9.2 8.9 9.0  MG  --   --   --  2.4  --   --   PHOS  --   --   --  3.2  --   --     CBC: Recent Labs  Lab 10/26/23 1146 10/27/23 0251 10/27/23 1106 10/28/23 0358 10/29/23 0437  WBC 10.5 9.5  --  11.7* 8.3  NEUTROABS 7.6  --   --  9.3*  --   HGB 11.9* 11.6* 11.6* 11.2* 10.9*  HCT 38.9 39.9 34.0* 36.4 35.5*  MCV 92.2 95.2  --  91.0 90.6  PLT 316 304  --  308 251    LFT Recent Labs  Lab 10/26/23 1146 10/27/23 1131 10/28/23 0358  AST 26 28 25   ALT 16 20 19   ALKPHOS 51 53 52  BILITOT 0.6 0.8 0.8  PROT 6.8 6.4* 6.3*  ALBUMIN 3.1* 2.9* 2.9*     Antibiotics: Anti-infectives (From admission, onward)    Start     Dose/Rate Route Frequency Ordered Stop   10/29/23 2200  cefTRIAXone  (ROCEPHIN ) 2 g in sodium chloride  0.9 % 100 mL IVPB  Status:  Discontinued        2 g 200 mL/hr over 30 Minutes Intravenous Every 24 hours 10/29/23 0926 10/30/23 1153   10/28/23 1245  ceFEPIme  (MAXIPIME ) 2 g in sodium chloride  0.9 % 100 mL IVPB  Status:  Discontinued        2 g 200 mL/hr over 30 Minutes Intravenous Every 12 hours 10/28/23 1150 10/29/23 0926   10/27/23 1000  vancomycin  (VANCOREADY) IVPB 750 mg/150 mL  Status:  Discontinued        750 mg 150 mL/hr over 60 Minutes Intravenous Every 24 hours 10/26/23 1943 10/27/23 1324   10/27/23 0200  ceFEPIme  (MAXIPIME ) 2 g in sodium chloride  0.9 % 100 mL IVPB  Status:  Discontinued        2 g 200 mL/hr over 30 Minutes Intravenous Every 12 hours 10/26/23 1943 10/27/23 1810   10/26/23 1430  vancomycin  (VANCOCIN ) IVPB 1000 mg/200 mL premix  Status:  Discontinued        1,000 mg 200 mL/hr over 60 Minutes  Intravenous  Once 10/26/23 1421 10/26/23 1424   10/26/23 1430  ceFEPIme  (MAXIPIME )  2 g in sodium chloride  0.9 % 100 mL IVPB        2 g 200 mL/hr over 30 Minutes Intravenous  Once 10/26/23 1421 10/26/23 1504   10/26/23 1430  vancomycin  (VANCOREADY) IVPB 1500 mg/300 mL        1,500 mg 150 mL/hr over 120 Minutes Intravenous  Once 10/26/23 1424 10/26/23 1750        DVT prophylaxis: Apixaban   Code Status: DNR  Family Communication:    CONSULTS    Subjective   Breathing is improved.  Diuresing well with p.o. Lasix    Objective    Physical Examination:  General-appears in no acute distress Heart-S1-S2, regular, no murmur auscultated Lungs-clear to auscultation bilaterally, no wheezing or crackles auscultated Abdomen-soft, nontender, no organomegaly Extremities-no edema in the lower extremities Neuro-alert, oriented x3, no focal deficit noted  Status is: Inpatient:             Sabas GORMAN Brod   [facility] If 7PM-7AM, please contact night-coverage at [email or web address], Office  [phone]   10/31/2023, 8:06 AM  LOS: 5 days

## 2023-10-31 NOTE — Progress Notes (Signed)
 Physical Therapy Treatment Patient Details Name: [name] MRN: [id number] DOB: [birth year] Today's Date: 10/31/2023   History of Present Illness Pt is a 78 y/o female presenting 8/11 with AMS and progressive weakness. Recent admission 8/6-8/9 for ILD flare. PMH:  chronic resp failure on 2L, ILD, CHF, CAD s/p CABG, PAF, SSS s/p PPM, T2DM    PT Comments  The pt is making continued progress with functional mobility, demonstrating improved balance, strength, and endurance through performing all functional mobility while utilizing a RW with only CGA for safety today and progressing to ambulating an increased distance of up to ~55 ft at a time. She demonstrated improved carryover of cues for hand placement with transfers, but still initially needs reminders. She did progress to attempting to ambulate without UE support, but displayed increased instability and ambulated with smaller, slower steps. She needed minA for balance when ambulating without UE support. Thus, continue to recommend she use her rollator for all standing mobility at d/c. Will continue to follow acutely.     If plan is discharge home, recommend the following: Help with stairs or ramp for entrance;Assist for transportation;Direct supervision/assist for medications management;A little help with walking and/or transfers;Assistance with cooking/housework;Direct supervision/assist for financial management;Supervision due to cognitive status;A little help with bathing/dressing/bathroom   Can travel by private vehicle     Yes  Equipment Recommendations  None recommended by PT    Recommendations for Other Services       Precautions / Restrictions Precautions Precautions: Fall Recall of Precautions/Restrictions: Impaired Precaution/Restrictions Comments: monitor O2 (2-3 L O2 at baseline) Restrictions Weight Bearing Restrictions Per Provider Order: No     Mobility  Bed Mobility Overal bed mobility: Needs Assistance Bed  Mobility: Supine to Sit     Supine to sit: HOB elevated, Used rails, Supervision     General bed mobility comments: Extra time to transition supine to sit R EOB from elevated HOB, no cues needed, supervision for safety    Transfers Overall transfer level: Needs assistance Equipment used: Rolling walker (2 wheels) Transfers: Sit to/from Stand, Bed to chair/wheelchair/BSC Sit to Stand: Contact guard assist   Step pivot transfers: Contact guard assist       General transfer comment: Pt needed reminders initially for hand placement with sit <> stand transfers, but demonstrated good carryover with subsequent reps. CGA for safety standing from EOB 1x, commode 1x, and chair 2x. CGA for safety to step pivot bed to commode with RW support    Ambulation/Gait Ambulation/Gait assistance: Contact guard assist, +2 safety/equipment, Min assist Gait Distance (Feet): 55 Feet (x4 bouts of ~2 ft > ~55 ft > ~45 ft > ~20 ft) Assistive device: Rolling walker (2 wheels), None Gait Pattern/deviations: Step-through pattern, Decreased stride length, Trunk flexed, Decreased step length - right, Decreased step length - left Gait velocity: reduced Gait velocity interpretation: <1.31 ft/sec, indicative of household ambulator   General Gait Details: Pt demonstrated improved bil step length when cued while utilizing RW for support this date. She needed repeated cues to look superiorly to improve upright posture. She ambulated the first x3 bouts with the RW with CGA for safety only, no LOB noted. The fourth rep, pt ambulated without UE support with light minA for balance, demonstrating decreased bil step length and increased instability compared to when using the RW.   Stairs             Wheelchair Mobility     Tilt Bed    Modified Rankin (Stroke Patients Only)  Balance Overall balance assessment: Needs assistance Sitting-balance support: No upper extremity supported, Feet supported Sitting  balance-Leahy Scale: Fair Sitting balance - Comments: static sitting EOB with supervision for safety   Standing balance support: Bilateral upper extremity supported, During functional activity, Reliant on assistive device for balance, Single extremity supported Standing balance-Leahy Scale: Poor Standing balance comment: Reliant on RW or minA to ambulate; able to reach off COG to perform pericare with CGA with x1 UE support, no LOB                            Communication Communication Communication: Impaired Factors Affecting Communication: Hearing impaired  Cognition Arousal: Alert Behavior During Therapy: WFL for tasks assessed/performed   PT - Cognitive impairments: Problem solving, Sequencing                       PT - Cognition Comments: Delayed processing. Following commands: Impaired Following commands impaired: Follows multi-step commands with increased time    Cueing Cueing Techniques: Verbal cues, Tactile cues  Exercises      General Comments General comments (skin integrity, edema, etc.): SpO2 >/= 89% on 2-3L O2      Pertinent Vitals/Pain Pain Assessment Pain Assessment: Faces Faces Pain Scale: Hurts little more Pain Location: buttocks Pain Descriptors / Indicators: Discomfort, Sore Pain Intervention(s): Limited activity within patient's tolerance, Monitored during session, Repositioned    Home Living                          Prior Function            PT Goals (current goals can now be found in the care plan section) Acute Rehab PT Goals Patient Stated Goal: get back to normal PT Goal Formulation: With patient/family Time For Goal Achievement: 11/10/23 Potential to Achieve Goals: Fair Progress towards PT goals: Progressing toward goals    Frequency    Min 2X/week      PT Plan      Co-evaluation              AM-PAC PT 6 Clicks Mobility   Outcome Measure  Help needed turning from your back to your side  while in a flat bed without using bedrails?: A Little Help needed moving from lying on your back to sitting on the side of a flat bed without using bedrails?: A Little Help needed moving to and from a bed to a chair (including a wheelchair)?: A Little Help needed standing up from a chair using your arms (e.g., wheelchair or bedside chair)?: A Little Help needed to walk in hospital room?: A Little Help needed climbing 3-5 steps with a railing? : Total 6 Click Score: 16    End of Session Equipment Utilized During Treatment: Gait belt;Oxygen  Activity Tolerance: Patient tolerated treatment well Patient left: with call bell/phone within reach;with family/visitor present;in chair Nurse Communication: Mobility status (NT) PT Visit Diagnosis: Other abnormalities of gait and mobility (R26.89);Muscle weakness (generalized) (M62.81);Other symptoms and signs involving the nervous system (R29.898);Unsteadiness on feet (R26.81);Difficulty in walking, not elsewhere classified (R26.2)     Time: 8457-8382 PT Time Calculation (min) (ACUTE ONLY): 35 min  Charges:    $Gait Training: 8-22 mins $Therapeutic Activity: 8-22 mins PT General Charges $$ ACUTE PT VISIT: 1 Visit                     Theo Ferretti, PT,  DPT Acute Rehabilitation Services  Office: [phone]    Theo CHRISTELLA Ferretti 10/31/2023, 4:33 PM

## 2023-11-01 DIAGNOSIS — J849 Interstitial pulmonary disease, unspecified: Secondary | ICD-10-CM | POA: Diagnosis not present

## 2023-11-01 DIAGNOSIS — Z951 Presence of aortocoronary bypass graft: Secondary | ICD-10-CM | POA: Diagnosis not present

## 2023-11-01 DIAGNOSIS — I10 Essential (primary) hypertension: Secondary | ICD-10-CM

## 2023-11-01 DIAGNOSIS — Z950 Presence of cardiac pacemaker: Secondary | ICD-10-CM | POA: Diagnosis not present

## 2023-11-01 DIAGNOSIS — I5032 Chronic diastolic (congestive) heart failure: Secondary | ICD-10-CM | POA: Diagnosis not present

## 2023-11-01 LAB — BASIC METABOLIC PANEL WITH GFR
Anion gap: 8 (ref 5–15)
BUN: 17 mg/dL (ref 8–23)
CO2: 41 mmol/L — ABNORMAL HIGH (ref 22–32)
Calcium: 9.1 mg/dL (ref 8.9–10.3)
Chloride: 89 mmol/L — ABNORMAL LOW (ref 98–111)
Creatinine, Ser: 0.94 mg/dL (ref 0.44–1.00)
GFR, Estimated: >60 mL/min (ref >60)
Glucose, Bld: 217 mg/dL — ABNORMAL HIGH (ref 70–99)
Potassium: 3.3 mmol/L — ABNORMAL LOW (ref 3.5–5.1)
Sodium: 138 mmol/L (ref 135–145)

## 2023-11-01 LAB — GLUCOSE, CAPILLARY
Glucose-Capillary: 147 mg/dL — ABNORMAL HIGH (ref 70–99)
Glucose-Capillary: 233 mg/dL — ABNORMAL HIGH (ref 70–99)
Glucose-Capillary: 199 mg/dL — ABNORMAL HIGH (ref 70–99)
Glucose-Capillary: 262 mg/dL — ABNORMAL HIGH (ref 70–99)

## 2023-11-01 MED ORDER — FUROSEMIDE 40 MG PO TABS
40.0000 mg | ORAL_TABLET | Freq: Every day | ORAL | Status: DC
  Administered 2023-11-02 – 2023-11-05 (×4): 40 mg via ORAL
  Filled 2023-11-01 (×4): qty 1

## 2023-11-01 MED ORDER — POTASSIUM CHLORIDE 20 MEQ PO PACK
40.0000 meq | PACK | Freq: Once | ORAL | Status: AC
  Administered 2023-11-01: 40 meq via ORAL
  Filled 2023-11-01: qty 2

## 2023-11-01 MED ORDER — POLYVINYL ALCOHOL 1.4 % OP SOLN
1.0000 [drp] | OPHTHALMIC | Status: DC | PRN
  Administered 2023-11-01 – 2023-11-05 (×2): 1 [drp] via OPHTHALMIC
  Filled 2023-11-01: qty 15

## 2023-11-01 NOTE — Progress Notes (Signed)
   11/01/23 2240  BiPAP/CPAP/SIPAP  $ Non-Invasive Ventilator  Non-Invasive Vent Subsequent  BiPAP/CPAP/SIPAP Pt Type Adult  BiPAP/CPAP/SIPAP SERVO  Mask Type Full face mask  Mask Size Medium  Set Rate 15 breaths/min  Respiratory Rate 18 breaths/min  IPAP 10 cmH20  EPAP 5 cmH2O  FiO2 (%) 40 %  Minute Ventilation 5.9  Leak 33  Peak Inspiratory Pressure (PIP) 10  Tidal Volume (Vt) 418  Patient Home Machine No  Patient Home Mask No  Patient Home Tubing No  Auto Titrate No  CPAP/SIPAP surface wiped down Yes  Device Plugged into RED Power Outlet Yes  BiPAP/CPAP /SiPAP Vitals  Pulse Rate 60  Resp 18  Bilateral Breath Sounds Clear;Diminished  MEWS Score/Color  MEWS Score 0  MEWS Score Color Green

## 2023-11-01 NOTE — Plan of Care (Signed)
   Problem: Activity: Goal: Risk for activity intolerance will decrease Outcome: Progressing   Problem: Nutrition: Goal: Adequate nutrition will be maintained Outcome: Progressing

## 2023-11-01 NOTE — Progress Notes (Addendum)
 [facility]  PROGRESS NOTE  [name] FMW:[id number] DOB: [birth year] DOA: 10/26/2023 PCP: Larnell Hamilton, MD   Brief HPI:    78 y.o. 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  She was last hospitalized from 8/6-9 with an acute exacerbation of pulmonary fibrosis and responded well to IV Solumedrol, empiric antibiotics, nebulizers, supplemental O2. She was discharged on PO Augmentin  + Azithromycin  and a prednisone  taper.  She is currently on 4L home O2.    Presents with confusion and worsening somnolence.  Found to be hypercarbic and transferred to the progressive care unit and placed on BiPAP.  Pulmonary consulted and changed her back to oral prednisone .  Working her up for encephalopathy otherwise as below.  PT/OT recommending SNF now.     Assessment/Plan:   Hypersomnolence -Somnolence resolved after using BiPAP - Likely in setting of hypercapnic respiratory failure, Peace due to elevated at 84 per VBG from 10/27/2023 - pCO2 improved to 75 with BiPAP; continue BiPAP as tolerated - Patient is more alert and awake.  Answering question appropriately.  Acute encephalopathy - Likely in setting of above - TSH 0.126, B12 1384, HIV nonreactive   Checked EEG and showed that the study was suggestive of moderate to severe diffuse encephalopathy with no seizures or epileptiform discharges seen throughout the recording.  -MRI brain showed no acute abnormality - Started on lactulose  for high ammonia, repeat ammonia level 33.  Will discontinue lactulose   Acute on chronic HFpEF -Appears fluid overload -BNP 323.5 - Patient takes Lasix  40 mg p.o. twice daily at home - She was given  Lasix  40 mg IV every 12 hours and assess diuretic response - Diuresed well with IV Lasix , will hold IV Lasix  - Continue Lasix  40 mg p.o. twice daily  Recent  pneumonia -Treated with antibiotics and steroids -Procalcitonin less than 0.10 -Currently on cefepime  - IV antibiotics stopped on 10/30/2023   ILD Flare: Patient with recent hospitalization for ILD flare.  -She was treated with course of steroids and antibiotics - Currently on prednisone   Metabolic alkalosis - CO2 elevated at 41, likely contraction alkalosis from diuresis as well as compensation from respiratory acidosis - Will check VBG today - Change Lasix  to 40 mg daily for now - Follow renal function in a.m.  Hypokalemia - Potassium is 3.3 - Replace potassium for BMP in am   Chronic HFpEF:  Lasix  restarted as above   CAD  S/p CABG: No c/o CP   Afib:  Has pacemaker and Rate controlled with Toprol  XL. C/w Anticoagulation w/ Apixaban    HLD:  On Repatha  at home   DMType 2: A1c 7.4 Continue sliding scale insulin  with NovoLog  -CBG well-controlled  Normocytic anemia - CTM for S/Sx of Bleeding; No overt bleeding noted.  - Hemoglobin stable at 11.2    Class II Obesity: Complicates overall prognosis and care. Estimated body mass index is 36.79 kg/m as calculated from the following:   Height as of this encounter: 5' 3 (1.6 m).   Weight as of this encounter: 94.2 kg. Weight Loss and Dietary Counseling given    Medications     apixaban   5 mg Oral BID   benzonatate   100 mg Oral BID   docusate sodium   100 mg Oral BID   furosemide   40 mg Oral BID   insulin  aspart  0-20 Units Subcutaneous TID WC   insulin   aspart  0-5 Units Subcutaneous QHS   ipratropium-albuterol   3 mL Nebulization BID   lactulose   20 g Oral BID   metoprolol  succinate  50 mg Oral Daily   predniSONE   10 mg Oral BID WC   sodium chloride  flush  3 mL Intravenous Q12H     Data Reviewed:   CBG:  Recent Labs  Lab 10/31/23 0805 10/31/23 1216 10/31/23 1616 10/31/23 2059 11/01/23 0731  GLUCAP 150* 232* 216* 227* 147*    SpO2: 97 % O2 Flow Rate (L/min): 2 L/min FiO2 (%): 40 %    Vitals:    10/31/23 1936 10/31/23 2333 11/01/23 0404 11/01/23 0733  BP: (!) 150/61 (!) 170/71 (!) 158/67 137/63  Pulse: (!) 58 60 (!) 59 (!) 59  Resp: 19 19 12 18   Temp: 97.6 F (36.4 C)  98.5 F (36.9 C) 97.6 F (36.4 C)  TempSrc: Oral  Oral Oral  SpO2: 95% 100% 100% 97%  Weight:      Height:          Data Reviewed:  Basic Metabolic Panel: Recent Labs  Lab 10/26/23 1146 10/27/23 0251 10/27/23 1106 10/28/23 0358 10/29/23 0437 10/30/23 0455  NA 140 136 131* 137 138 143  K 4.5 4.9 4.7 4.2 4.1 3.8  CL 99 99  --  97* 95* 95*  CO2 27 30  --  31 35* 37*  GLUCOSE 75 113*  --  165* 166* 177*  BUN 39* 40*  --  42* 35* 25*  CREATININE 1.02* 1.01*  --  1.08* 0.94 0.83  CALCIUM  9.1 8.8*  --  9.2 8.9 9.0  MG  --   --   --  2.4  --   --   PHOS  --   --   --  3.2  --   --     CBC: Recent Labs  Lab 10/26/23 1146 10/27/23 0251 10/27/23 1106 10/28/23 0358 10/29/23 0437  WBC 10.5 9.5  --  11.7* 8.3  NEUTROABS 7.6  --   --  9.3*  --   HGB 11.9* 11.6* 11.6* 11.2* 10.9*  HCT 38.9 39.9 34.0* 36.4 35.5*  MCV 92.2 95.2  --  91.0 90.6  PLT 316 304  --  308 251    LFT Recent Labs  Lab 10/26/23 1146 10/27/23 1131 10/28/23 0358  AST 26 28 25   ALT 16 20 19   ALKPHOS 51 53 52  BILITOT 0.6 0.8 0.8  PROT 6.8 6.4* 6.3*  ALBUMIN 3.1* 2.9* 2.9*     Antibiotics: Anti-infectives (From admission, onward)    Start     Dose/Rate Route Frequency Ordered Stop   10/29/23 2200  cefTRIAXone  (ROCEPHIN ) 2 g in sodium chloride  0.9 % 100 mL IVPB  Status:  Discontinued        2 g 200 mL/hr over 30 Minutes Intravenous Every 24 hours 10/29/23 0926 10/30/23 1153   10/28/23 1245  ceFEPIme  (MAXIPIME ) 2 g in sodium chloride  0.9 % 100 mL IVPB  Status:  Discontinued        2 g 200 mL/hr over 30 Minutes Intravenous Every 12 hours 10/28/23 1150 10/29/23 0926   10/27/23 1000  vancomycin  (VANCOREADY) IVPB 750 mg/150 mL  Status:  Discontinued        750 mg 150 mL/hr over 60 Minutes Intravenous Every 24 hours  10/26/23 1943 10/27/23 1324   10/27/23 0200  ceFEPIme  (MAXIPIME ) 2 g in sodium chloride  0.9 % 100 mL IVPB  Status:  Discontinued  2 g 200 mL/hr over 30 Minutes Intravenous Every 12 hours 10/26/23 1943 10/27/23 1810   10/26/23 1430  vancomycin  (VANCOCIN ) IVPB 1000 mg/200 mL premix  Status:  Discontinued        1,000 mg 200 mL/hr over 60 Minutes Intravenous  Once 10/26/23 1421 10/26/23 1424   10/26/23 1430  ceFEPIme  (MAXIPIME ) 2 g in sodium chloride  0.9 % 100 mL IVPB        2 g 200 mL/hr over 30 Minutes Intravenous  Once 10/26/23 1421 10/26/23 1504   10/26/23 1430  vancomycin  (VANCOREADY) IVPB 1500 mg/300 mL        1,500 mg 150 mL/hr over 120 Minutes Intravenous  Once 10/26/23 1424 10/26/23 1750        DVT prophylaxis: Apixaban   Code Status: DNR  Family Communication:    CONSULTS    Subjective   Breathing has improved.  Complains of loose stools after starting lactulose  for high ammonia level.   Objective    Physical Examination:  Appears in no acute distress Lungs are clear to auscultation bilaterally Abdomen is soft, nontender, no organomegaly Extremities no edema  Status is: Inpatient:             Sabas GORMAN Brod   [facility] If 7PM-7AM, please contact night-coverage at [email or web address], Office  [phone]   11/01/2023, 8:01 AM  LOS: 6 days

## 2023-11-02 ENCOUNTER — Telehealth: Payer: Self-pay | Admitting: Pulmonary Disease

## 2023-11-02 DIAGNOSIS — J9621 Acute and chronic respiratory failure with hypoxia: Secondary | ICD-10-CM | POA: Diagnosis not present

## 2023-11-02 DIAGNOSIS — J9622 Acute and chronic respiratory failure with hypercapnia: Secondary | ICD-10-CM | POA: Diagnosis not present

## 2023-11-02 DIAGNOSIS — J849 Interstitial pulmonary disease, unspecified: Secondary | ICD-10-CM | POA: Diagnosis not present

## 2023-11-02 LAB — GLUCOSE, CAPILLARY
Glucose-Capillary: 153 mg/dL — ABNORMAL HIGH (ref 70–99)
Glucose-Capillary: 259 mg/dL — ABNORMAL HIGH (ref 70–99)
Glucose-Capillary: 182 mg/dL — ABNORMAL HIGH (ref 70–99)
Glucose-Capillary: 248 mg/dL — ABNORMAL HIGH (ref 70–99)

## 2023-11-02 LAB — BLOOD GAS, VENOUS
Acid-Base Excess: 18.2 mmol/L — ABNORMAL HIGH (ref 0.0–2.0)
Bicarbonate: 46.7 mmol/L — ABNORMAL HIGH (ref 20.0–28.0)
O2 Saturation: 49.2 %
Patient temperature: 36.6
pCO2, Ven: 71 mmHg (ref 44–60)
pH, Ven: 7.43 (ref 7.25–7.43)
pO2, Ven: <31 mmHg — CL (ref 32–45)

## 2023-11-02 LAB — BASIC METABOLIC PANEL WITH GFR
Anion gap: 10 (ref 5–15)
BUN: 19 mg/dL (ref 8–23)
CO2: 43 mmol/L — ABNORMAL HIGH (ref 22–32)
Calcium: 8.9 mg/dL (ref 8.9–10.3)
Chloride: 86 mmol/L — ABNORMAL LOW (ref 98–111)
Creatinine, Ser: 0.89 mg/dL (ref 0.44–1.00)
GFR, Estimated: >60 mL/min (ref >60)
Glucose, Bld: 171 mg/dL — ABNORMAL HIGH (ref 70–99)
Potassium: 3.4 mmol/L — ABNORMAL LOW (ref 3.5–5.1)
Sodium: 139 mmol/L (ref 135–145)

## 2023-11-02 MED ORDER — POTASSIUM CHLORIDE CRYS ER 20 MEQ PO TBCR
40.0000 meq | EXTENDED_RELEASE_TABLET | Freq: Once | ORAL | Status: AC
  Administered 2023-11-02: 40 meq via ORAL
  Filled 2023-11-02: qty 2

## 2023-11-02 MED ORDER — ACETAZOLAMIDE SODIUM 500 MG IJ SOLR
500.0000 mg | Freq: Once | INTRAMUSCULAR | Status: AC
  Administered 2023-11-02: 500 mg via INTRAVENOUS
  Filled 2023-11-02: qty 500

## 2023-11-02 NOTE — Progress Notes (Signed)
 Occupational Therapy Treatment Patient Details Name: [name] MRN: [id number] DOB: 12-Dec-1945 Today's Date: 11/02/2023   History of present illness 78 yo female adm 10/26/23 with AMS, weakness, hypercarbic and acute encephalopathy. PMHx: Admission 8/6-8/9 with ILD flare. Chronic resp failure on 2L, ILD, CHF, CAD s/p CABG, PAF, SSS s/p PPM, T2DM   OT comments  Pt received in bed and reports doing/feeling better. [name], pt and daughter educated on energy conservation techniques with handouts provided, [name]. OT will continue to follow acutely to maximize level of function and safety      If plan is discharge home, recommend the following:  A lot of help with bathing/dressing/bathroom;A little help with walking and/or transfers;Help with stairs or ramp for entrance;Assistance with cooking/housework   Equipment Recommendations  Other (comment) (LH bath sponge)    Recommendations for Other Services      Precautions / Restrictions Precautions Precautions: Fall Recall of Precautions/Restrictions: Intact Precaution/Restrictions Comments: monitor O2 (2-3 L O2 at baseline) Restrictions Weight Bearing Restrictions Per Provider Order: No       Mobility Bed Mobility Overal bed mobility: Modified Independent Bed Mobility: Supine to Sit, Sit to Supine                Transfers Overall transfer level: Needs assistance Equipment used: Rolling walker (2 wheels) Transfers: Sit to/from Stand Sit to Stand: Contact guard assist Stand pivot transfers: Contact guard assist   Step pivot transfers: Contact guard assist           Balance Overall balance assessment: Needs assistance Sitting-balance support: No upper extremity supported, Feet supported Sitting balance-Leahy Scale: Fair     Standing balance support: Bilateral upper extremity supported,  During functional activity, Reliant on assistive device for balance Standing balance-Leahy Scale: Poor                             ADL either performed or assessed with clinical judgement   ADL Overall ADL's : Needs assistance/impaired     Grooming: Wash/dry hands;Wash/dry face;Contact guard assist;Standing       Lower Body Bathing: Moderate assistance;Sitting/lateral leans       Lower Body Dressing: Maximal assistance;Sitting/lateral leans Lower Body Dressing Details (indicate cue type and reason): socks Toilet Transfer: Contact guard assist;Rolling walker (2 wheels);BSC/3in1;Cueing for safety   Toileting- Clothing Manipulation and Hygiene: Moderate assistance;Sitting/lateral lean;Sit to/from stand       Functional mobility during ADLs: Contact guard assist;Rolling walker (2 wheels);Cueing for safety General ADL Comments: Pt doing/feeling better. [name], pt and daughter educated on energy conservation techniques with handouts provided, [name]    Extremity/Trunk Assessment Upper Extremity Assessment Upper Extremity Assessment: Generalized weakness;Right hand dominant   Lower Extremity Assessment Lower Extremity Assessment: Defer to PT evaluation        Vision Patient Visual Report: No change from baseline     Perception     Praxis     Communication     Cognition Arousal: Alert Behavior During Therapy: [facility] for tasks assessed/performed  Following commands: Intact        Cueing   Cueing Techniques: Verbal cues  Exercises      Shoulder Instructions       General Comments      Pertinent Vitals/ Pain       Pain Assessment Pain Score: 0-No pain Pain Intervention(s): Monitored during session, Repositioned  Home Living                                           Prior Functioning/Environment              Frequency  Min 2X/week        Progress Toward Goals  OT Goals(current goals can now be found in the care plan section)  Progress towards OT goals: Progressing toward goals;Goals updated  ADL Goals Pt Will Perform Lower Body Bathing: with min assist Pt Will Perform Lower Body Dressing: with min assist Pt Will Perform Toileting - Clothing Manipulation and hygiene: with min assist;with contact guard assist;sitting/lateral leans;sit to/from stand  Plan      Co-evaluation                 AM-PAC OT 6 Clicks Daily Activity     Outcome Measure   Help from another person eating meals?: None Help from another person taking care of personal grooming?: A Little Help from another person toileting, which includes using toliet, bedpan, or urinal?: A Lot Help from another person bathing (including washing, rinsing, drying)?: A Lot Help from another person to put on and taking off regular upper body clothing?: A Little Help from another person to put on and taking off regular lower body clothing?: A Lot 6 Click Score: 16    End of Session Equipment Utilized During Treatment: Oxygen ;Gait belt;Rolling walker (2 wheels);Other (comment) (BSC)  OT Visit Diagnosis: Unsteadiness on feet (R26.81);Other abnormalities of gait and mobility (R26.89);Muscle weakness (generalized) (M62.81);Other symptoms and signs involving cognitive function   Activity Tolerance Patient tolerated treatment well   Patient Left with call bell/phone within reach;with family/visitor present;in bed   Nurse Communication Mobility status        Time: 8660-8585 OT Time Calculation (min): 35 min  Charges: OT General Charges $OT Visit: 1 Visit OT Treatments $Self Care/Home Management : 8-22 mins $Therapeutic Activity: 8-22 mins   Jacques Karna Loose 11/02/2023, 3:14 PM

## 2023-11-02 NOTE — Telephone Encounter (Signed)
 Appt made for 9/10 @10 :30, pt daughter notified

## 2023-11-02 NOTE — Plan of Care (Signed)
  Problem: Clinical Measurements: Goal: Respiratory complications will improve Outcome: Progressing Goal: Cardiovascular complication will be avoided Outcome: Progressing   Problem: Activity: Goal: Risk for activity intolerance will decrease Outcome: Progressing   Problem: Nutrition: Goal: Adequate nutrition will be maintained Outcome: Progressing   Problem: Safety: Goal: Ability to remain free from injury will improve Outcome: Progressing   Problem: Respiratory: Goal: Ability to maintain adequate ventilation will improve Outcome: Progressing Goal: Ability to maintain a clear airway will improve Outcome: Progressing

## 2023-11-02 NOTE — Telephone Encounter (Signed)
 Please arrange hospital follow-up in 2 to 4 weeks with Dr. Annella or APP.

## 2023-11-02 NOTE — Progress Notes (Signed)
   11/02/23 2337  Vent Select  Invasive or Noninvasive Noninvasive  Adult Vent Y  Adult Ventilator Settings  Vent Type Servo i  Vent Mode BIPAP;PSV  FiO2 (%) 40 %  Pressure Support 5 cmH20  PEEP 5 cmH20  Adult Ventilator Measurements  Peak Airway Pressure 10 L/min  Mean Airway Pressure 6.5 cmH20  Resp Rate Spontaneous 18 br/min  Resp Rate Total 18 br/min  Exhaled Vt 428 mL  Measured Ve 7.6 L  Auto PEEP 0 cmH20  Total PEEP 5 cmH20  SpO2 98 %  Adult Ventilator Alarms  Alarms On Y  Ve High Alarm 18 L/min  Ve Low Alarm 4 L/min  Resp Rate High Alarm 38 br/min  Resp Rate Low Alarm 8  PEEP Low Alarm 2 cmH2O  Press High Alarm 25 cmH2O

## 2023-11-02 NOTE — Progress Notes (Signed)
 Physical Therapy Treatment Patient Details Name: [name] MRN: [id number] DOB: 1945-07-07 Today's Date: 11/02/2023   History of Present Illness 78 yo female adm 10/26/23 with AMS, weakness, hypercarbic and acute encephalopathy. PMHx: Admission 8/6-8/9 with ILD flare. Chronic resp failure on 2L, ILD, CHF, CAD s/p CABG, PAF, SSS s/p PPM, T2DM    PT Comments  Pt able to progress gait distance with use of rollator and 4L during activity. Pt with SPO2 95% at rest on 2L with desaturation to 87% with limited mobility requiring 4L with activity, pt and daughter educated for monitoring SPO2 with home pulse ox. Pt encouraged to progress gait distance and trials with use of rollator for safety and perform sit to stands. Will continue to follow with HHPT appropriate.    If plan is discharge home, recommend the following: Help with stairs or ramp for entrance;Assist for transportation;Direct supervision/assist for medications management;A little help with walking and/or transfers;Assistance with cooking/housework;Direct supervision/assist for financial management;Supervision due to cognitive status;A little help with bathing/dressing/bathroom   Can travel by private vehicle     Yes  Equipment Recommendations  None recommended by PT    Recommendations for Other Services       Precautions / Restrictions Precautions Precautions: Fall Recall of Precautions/Restrictions: Intact Precaution/Restrictions Comments: monitor O2 (2-3 L O2 at baseline)     Mobility  Bed Mobility Overal bed mobility: Modified Independent Bed Mobility: Supine to Sit     Supine to sit: HOB elevated, Used rails, Modified independent (Device/Increase time)     General bed mobility comments: HOB 35 degrees, use of rail, increased time without physical assist or cues    Transfers Overall transfer level: Needs assistance   Transfers: Sit to/from Stand Sit to Stand: Contact guard assist, Min assist            General transfer comment: CGA to rise from bed with use of rail to push off, from toilet with rail required min assist to power up, cues for sequence. performed 5 repeated sit to stands from recliner with reliance of UB and increased time    Ambulation/Gait Ambulation/Gait assistance: Contact guard assist Gait Distance (Feet): 60 Feet Assistive device: Rollator (4 wheels) Gait Pattern/deviations: Step-through pattern, Decreased stride length, Trunk flexed   Gait velocity interpretation: <1.31 ft/sec, indicative of household ambulator   General Gait Details: pt walked 15', 50', 60', respectively with seated rest at toilet and rollator between trials, cues for use of brakes and direction but able to progress gait trials with use of 4L to maintain SPO2 92% during gait   Stairs             Wheelchair Mobility     Tilt Bed    Modified Rankin (Stroke Patients Only)       Balance Overall balance assessment: Needs assistance Sitting-balance support: No upper extremity supported, Feet supported Sitting balance-Leahy Scale: Fair Sitting balance - Comments: EOb and toilet   Standing balance support: Bilateral upper extremity supported, During functional activity, Reliant on assistive device for balance Standing balance-Leahy Scale: Poor Standing balance comment: rollator for gait                            Communication Communication Communication: No apparent difficulties  Cognition Arousal: Alert Behavior During Therapy: WFL for tasks assessed/performed   PT - Cognitive impairments: No apparent impairments  Following commands: Intact      Cueing Cueing Techniques: Verbal cues  Exercises      General Comments        Pertinent Vitals/Pain Pain Assessment Pain Assessment: No/denies pain    Home Living                          Prior Function            PT Goals (current goals can now be found in the  care plan section) Progress towards PT goals: Progressing toward goals    Frequency    Min 2X/week      PT Plan      Co-evaluation              AM-PAC PT 6 Clicks Mobility   Outcome Measure  Help needed turning from your back to your side while in a flat bed without using bedrails?: A Little Help needed moving from lying on your back to sitting on the side of a flat bed without using bedrails?: A Little Help needed moving to and from a bed to a chair (including a wheelchair)?: A Little Help needed standing up from a chair using your arms (e.g., wheelchair or bedside chair)?: A Little Help needed to walk in hospital room?: A Little Help needed climbing 3-5 steps with a railing? : A Lot 6 Click Score: 17    End of Session Equipment Utilized During Treatment: Gait belt;Oxygen  Activity Tolerance: Patient tolerated treatment well Patient left: with call bell/phone within reach;with family/visitor present;in chair Nurse Communication: Mobility status PT Visit Diagnosis: Other abnormalities of gait and mobility (R26.89);Muscle weakness (generalized) (M62.81);Difficulty in walking, not elsewhere classified (R26.2)     Time: 0930-1005 PT Time Calculation (min) (ACUTE ONLY): 35 min  Charges:    $Gait Training: 8-22 mins $Therapeutic Activity: 8-22 mins PT General Charges $$ ACUTE PT VISIT: 1 Visit                     Lenoard SQUIBB, PT Acute Rehabilitation Services Office: [phone]    [name] [name] 11/02/2023, 11:15 AM

## 2023-11-02 NOTE — Progress Notes (Signed)
 [facility]  PROGRESS NOTE  KIRSTEIN BAXLEY FMW:[id number] DOB: Aug 02, 1945 DOA: 10/26/2023 PCP: Larnell Hamilton, MD   Brief HPI:    78 y.o. 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  She was last hospitalized from 8/6-9 with an acute exacerbation of pulmonary fibrosis and responded well to IV Solumedrol, empiric antibiotics, nebulizers, supplemental O2. She was discharged on PO Augmentin  + Azithromycin  and a prednisone  taper.  She is currently on 4L home O2.    Presents with confusion and worsening somnolence.  Found to be hypercarbic and transferred to the progressive care unit and placed on BiPAP.  Pulmonary consulted and changed her back to oral prednisone .  Working her up for encephalopathy otherwise as below.  PT/OT recommending SNF now.     Assessment/Plan:   Hypersomnolence -Somnolence resolved after using BiPAP - Likely in setting of hypercapnic respiratory failure, Peace due to elevated at 84 per VBG from 10/27/2023 - pCO2 improved to 75 with BiPAP; continue BiPAP as tolerated - Patient is more alert and awake.  Answering question appropriately.  Acute encephalopathy - Likely in setting of above - TSH 0.126, B12 1384, HIV nonreactive   Checked EEG and showed that the study was suggestive of moderate to severe diffuse encephalopathy with no seizures or epileptiform discharges seen throughout the recording.  -MRI brain showed no acute abnormality - Started on lactulose  for high ammonia, repeat ammonia level 33.  Lactulose  discontinued    Acute on chronic HFpEF -Appears fluid overload -BNP 323.5 - Patient takes Lasix  40 mg p.o. twice daily at home - She was given  Lasix  40 mg IV every 12 hours and assess diuretic response - Diuresed well with IV Lasix , will hold IV Lasix  - Continue Lasix  40 mg p.o. twice daily  Recent  pneumonia -Treated with antibiotics and steroids -Procalcitonin less than 0.10 -Currently on cefepime  - IV antibiotics stopped on 10/30/2023   ILD Flare: Patient with recent hospitalization for ILD flare.  -She was treated with course of steroids and antibiotics - Currently on prednisone   Metabolic alkalosis - CO2 elevated at 41, likely contraction alkalosis from diuresis as well as compensation from respiratory acidosis - VBG confirms bicarb of 46.7, likely has a compensation for respiratory acidosis - Continue Lasix  40 mg p.o. daily  -Will give 1 dose of Diamox  500 mg IV - Follow renal function in a.m.  Hypokalemia - Potassium is 3.4 - Will give 1 dose of potassium, K. Dur 40 mEq p.o.   Chronic HFpEF:  Lasix  restarted as above   CAD  S/p CABG: No c/o CP   Afib:  Has pacemaker and Rate controlled with Toprol  XL. C/w Anticoagulation w/ Apixaban    HLD:  On Repatha  at home   DMType 2: A1c 7.4 Continue sliding scale insulin  with NovoLog  -CBG well-controlled  Normocytic anemia - CTM for S/Sx of Bleeding; No overt bleeding noted.  - Hemoglobin stable at 11.2    Class II Obesity: Complicates overall prognosis and care. Estimated body mass index is 36.79 kg/m as calculated from the following:   Height as of this encounter: 5' 3 (1.6 m).   Weight as of this encounter: 94.2 kg. Weight Loss and Dietary Counseling given    Medications     apixaban   5 mg Oral BID   benzonatate   100 mg Oral BID   docusate sodium   100 mg Oral  BID   furosemide   40 mg Oral Daily   insulin  aspart  0-20 Units Subcutaneous TID WC   insulin  aspart  0-5 Units Subcutaneous QHS   ipratropium-albuterol   3 mL Nebulization BID   metoprolol  succinate  50 mg Oral Daily   predniSONE   10 mg Oral BID WC   sodium chloride  flush  3 mL Intravenous Q12H     Data Reviewed:   CBG:  Recent Labs  Lab 11/01/23 0731 11/01/23 1217 11/01/23 1706 11/01/23 2038 11/02/23 0728  GLUCAP 147* 233* 199* 262*  153*    SpO2: 100 % O2 Flow Rate (L/min): 3 L/min FiO2 (%): 40 %    Vitals:   11/01/23 2304 11/02/23 0043 11/02/23 0358 11/02/23 0731  BP:  (!) 162/72 (!) 141/63 (!) 120/50  Pulse: 60 61 (!) 59   Resp: 15 16 16 18   Temp:  (!) 97.5 F (36.4 C) (!) 97 F (36.1 C) 97.8 F (36.6 C)  TempSrc:  Axillary Axillary Oral  SpO2: 99% 99% 100%   Weight:      Height:          Data Reviewed:  Basic Metabolic Panel: Recent Labs  Lab 10/28/23 0358 10/29/23 0437 10/30/23 0455 11/01/23 1057 11/02/23 0623  NA 137 138 143 138 139  K 4.2 4.1 3.8 3.3* 3.4*  CL 97* 95* 95* 89* 86*  CO2 31 35* 37* 41* 43*  GLUCOSE 165* 166* 177* 217* 171*  BUN 42* 35* 25* 17 19  CREATININE 1.08* 0.94 0.83 0.94 0.89  CALCIUM  9.2 8.9 9.0 9.1 8.9  MG 2.4  --   --   --   --   PHOS 3.2  --   --   --   --     CBC: Recent Labs  Lab 10/26/23 1146 10/27/23 0251 10/27/23 1106 10/28/23 0358 10/29/23 0437  WBC 10.5 9.5  --  11.7* 8.3  NEUTROABS 7.6  --   --  9.3*  --   HGB 11.9* 11.6* 11.6* 11.2* 10.9*  HCT 38.9 39.9 34.0* 36.4 35.5*  MCV 92.2 95.2  --  91.0 90.6  PLT 316 304  --  308 251    LFT Recent Labs  Lab 10/26/23 1146 10/27/23 1131 10/28/23 0358  AST 26 28 25   ALT 16 20 19   ALKPHOS 51 53 52  BILITOT 0.6 0.8 0.8  PROT 6.8 6.4* 6.3*  ALBUMIN 3.1* 2.9* 2.9*     Antibiotics: Anti-infectives (From admission, onward)    Start     Dose/Rate Route Frequency Ordered Stop   10/29/23 2200  cefTRIAXone  (ROCEPHIN ) 2 g in sodium chloride  0.9 % 100 mL IVPB  Status:  Discontinued        2 g 200 mL/hr over 30 Minutes Intravenous Every 24 hours 10/29/23 0926 10/30/23 1153   10/28/23 1245  ceFEPIme  (MAXIPIME ) 2 g in sodium chloride  0.9 % 100 mL IVPB  Status:  Discontinued        2 g 200 mL/hr over 30 Minutes Intravenous Every 12 hours 10/28/23 1150 10/29/23 0926   10/27/23 1000  vancomycin  (VANCOREADY) IVPB 750 mg/150 mL  Status:  Discontinued        750 mg 150 mL/hr over 60 Minutes  Intravenous Every 24 hours 10/26/23 1943 10/27/23 1324   10/27/23 0200  ceFEPIme  (MAXIPIME ) 2 g in sodium chloride  0.9 % 100 mL IVPB  Status:  Discontinued        2 g 200 mL/hr over 30 Minutes Intravenous Every 12  hours 10/26/23 1943 10/27/23 1810   10/26/23 1430  vancomycin  (VANCOCIN ) IVPB 1000 mg/200 mL premix  Status:  Discontinued        1,000 mg 200 mL/hr over 60 Minutes Intravenous  Once 10/26/23 1421 10/26/23 1424   10/26/23 1430  ceFEPIme  (MAXIPIME ) 2 g in sodium chloride  0.9 % 100 mL IVPB        2 g 200 mL/hr over 30 Minutes Intravenous  Once 10/26/23 1421 10/26/23 1504   10/26/23 1430  vancomycin  (VANCOREADY) IVPB 1500 mg/300 mL        1,500 mg 150 mL/hr over 120 Minutes Intravenous  Once 10/26/23 1424 10/26/23 1750        DVT prophylaxis: Apixaban   Code Status: DNR  Family Communication:    CONSULTS    Subjective    Denies any complaints.  Breathing is improved  Objective    Physical Examination:  Appears in no acute distress  Status is: Inpatient:             Sabas GORMAN Brod   [facility] If 7PM-7AM, please contact night-coverage at [email or web address], Office  [phone]   11/02/2023, 8:10 AM  LOS: 7 days

## 2023-11-02 NOTE — Progress Notes (Signed)
   NAME:  [name], MRN:  [id number], DOB:  1945/05/02, LOS: 7 ADMISSION DATE:  10/26/2023, CONSULTATION DATE:  10/26/23 REFERRING MD:  Dr Lanie Herald, CHIEF COMPLAINT:  AMS/ Weakness   History of Present Illness:  78 year old female patient with history of chronic hypoxic respiratory failure secondary to ILD, HFpEF, CAD, diabetes recent hospitalization for acute exacerbation of pulmonary fibrosis given supportive measures, steroids, antibiotics, bronchodilators discharged on Augmentin  and azithromycin  with steroid taper. Patient presenting with weakness, cough, more confusion per the family. In the ER, patient was persistently short of breath, chest film revealed left-sided airspace opacities.  Station started on antibiotics started on steroids.  Pulmonary asked to see this patient for evaluation for underlying ILD and ongoing comorbidities.  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   Significant Hospital Events: Including procedures, antibiotic start and stop dates in addition to other pertinent events   CT chest 10/21/2023-interstitial lung disease, groundglass changes  Interim History / Subjective:   No acute issues Tolerating BiPAP at night More awake today morning.   Objective    Blood pressure (!) 146/53, pulse 60, temperature 97.7 F (36.5 C), temperature source Oral, resp. rate 18, height 5' 3 (1.6 m), weight 94.2 kg, SpO2 92%.    FiO2 (%):  [40 %] 40 %   Intake/Output  Summary (Last 24 hours) at 11/02/2023 1340 Last data filed at 11/01/2023 2219 Gross per 24 hour  Intake --  Output 425 ml  Net -425 ml   Filed Weights   10/26/23 1135  Weight: 94.2 kg    Examination: Gen:      No acute distress HEENT:  EOMI, sclera anicteric Neck:     No masses; no thyromegaly Lungs:    Clear to auscultation bilaterally; normal respiratory effort CV:         Regular rate and rhythm; no murmurs Abd:      + bowel sounds; soft, non-tender; no palpable masses, no distension Ext:    No edema; adequate peripheral perfusion Neuro: alert and oriented x 3 Psych: normal mood and affect   Resolved problem list   Assessment and Plan  Acute on chronic hypoxic hypercarbic respiratory failure Has history of unspecified diffuse parenchymal interstitial lung disease, 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  Treatment discharged with BiPAP as she would benefit from NIV Diuresis as tolerated. Add one dose diamox  today  Follow-up in clinic with Dr. Annella  Not much to add from pulmonary standpoint. We will be available as needed. Please call with questions   Lonna Coder MD [facility] See Amion for pager  If no response to pager , please call [phone] until 7pm After 7:00 pm call Elink  [phone] 11/02/2023, 1:40 PM

## 2023-11-02 NOTE — Progress Notes (Signed)
 Speech Language Pathology Treatment: Dysphagia  Patient Details Name: [name] MRN: [id number] DOB: [birth year] Today's Date: 11/02/2023 Time: 8686-8672 SLP Time Calculation (min) (ACUTE ONLY): 14 min  Assessment / Plan / Recommendation Clinical Impression  Pt seen for f/u dysphagia tx session with improved vocal quality and pt able to sustain conversation for extended period of time with less oxygen  requirement from prior session (decreased from 4L-2L).  Daughter in attendance for session.  Pt able to reiterate compensatory strategies of respiratory breaks.  Pt consumed small bites/sips, slow rate and upright positioning with mod I verbal cues from SLP.  No overt s/s of aspiration present throughout dysphagia tx and pt able to balance breathing/swallowing reciprocity and follow swallow precautions without fatigue noted during trial.  Recommend progressing diet to Regular/thin liquids with current swallow precautions in place.  ST will s/o in this setting as goals have been met and education completed.  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 Patient presenting with weakness, cough, more confusion per the family. Diagnosed with pna. In the ER, patient was persistently short of breath, chest film revealed left-sided airspace opacities. ST f/u for dysphagia tx/diet tolerance.      SLP Plan  Discharge SLP treatment due to (comment) (goals met/education completed)          Recommendations  Diet recommendations: Regular;Thin liquid Liquids provided via: Cup;Straw (small sips) Medication Administration: Whole meds with liquid (single pills/whole in puree prn) Supervision: Patient able to self feed;Intermittent  supervision to cue for compensatory strategies Compensations: Slow rate;Small sips/bites;Other (Comment) (respiratory breaks prn) Postural Changes and/or Swallow Maneuvers: Seated upright 90 degrees                  Oral care BID   PRN Dysphagia, unspecified (R13.10)     Discharge SLP treatment due to (comment) (goals met/education completed)     Pat Britton Bera,M.S.,CCC-SLP  11/02/2023, 1:35 PM

## 2023-11-02 NOTE — TOC Progression Note (Signed)
 Transition of Care [facility]) - Progression Note    Patient Details  Name: [name] MRN: [id number] Date of Birth: Dec 23, 1945  [facility] CM/SW Contact  Rosaline JONELLE Joe, RN Phone Number: 11/02/2023, 3:47 PM  Clinical Narrative:    CM called and spoke with [employer] - insurance authorization is still pending at this time for NIV.  Patient will need NIV and hospital bed for home prior to discharge to home - likely later this week.  I spoke with the daughter and asked that [employer] call her since daughter had a few questions regarding the NIV for home -   CM with IP Care management team will continue to follow the patient for needs for home.   Expected Discharge Plan: Home w Home Health Services Barriers to Discharge: No Barriers Identified               Expected Discharge Plan and Services In-house Referral: Clinical Social Work Discharge Planning Services: CM Consult Post Acute Care Choice: Home Health, Durable Medical Equipment Living arrangements for the past 2 months: Single Family Home                 DME Arranged: Hospital bed, NIV DME Agency: [facility] [employer] Date DME Agency Contacted: 10/29/23 Time DME Agency Contacted: 1027 Representative spoke with at DME Agency: [facility] HH Arranged: RN, Disease Management, PT, Nurse's Aide HH Agency: [facility] Date HH Agency Contacted: 10/29/23 Time HH Agency Contacted: 1028 Representative spoke with at [facility] Agency: [facility]   Social Drivers of Health (SDOH) Interventions SDOH Screenings   Food Insecurity: No Food Insecurity (10/26/2023)  Housing: Low Risk  (10/26/2023)  Transportation Needs: No Transportation Needs (10/26/2023)  Utilities: Not At Risk (10/26/2023)  Financial Resource Strain: Low Risk  (12/09/2018)  Social Connections: Unknown (10/26/2023)  Tobacco Use: Low Risk  (10/26/2023)    Readmission Risk Interventions    10/22/2023    4:58 PM  Readmission Risk Prevention Plan   Medication Screening Complete  Transportation Screening Complete

## 2023-11-03 DIAGNOSIS — Z951 Presence of aortocoronary bypass graft: Secondary | ICD-10-CM | POA: Diagnosis not present

## 2023-11-03 DIAGNOSIS — J849 Interstitial pulmonary disease, unspecified: Secondary | ICD-10-CM | POA: Diagnosis not present

## 2023-11-03 DIAGNOSIS — Z950 Presence of cardiac pacemaker: Secondary | ICD-10-CM | POA: Diagnosis not present

## 2023-11-03 DIAGNOSIS — I5032 Chronic diastolic (congestive) heart failure: Secondary | ICD-10-CM | POA: Diagnosis not present

## 2023-11-03 LAB — BASIC METABOLIC PANEL WITH GFR
Anion gap: 10 (ref 5–15)
BUN: 20 mg/dL (ref 8–23)
CO2: 34 mmol/L — ABNORMAL HIGH (ref 22–32)
Calcium: 9 mg/dL (ref 8.9–10.3)
Chloride: 91 mmol/L — ABNORMAL LOW (ref 98–111)
Creatinine, Ser: 0.79 mg/dL (ref 0.44–1.00)
GFR, Estimated: >60 mL/min (ref >60)
Glucose, Bld: 202 mg/dL — ABNORMAL HIGH (ref 70–99)
Potassium: 4.3 mmol/L (ref 3.5–5.1)
Sodium: 135 mmol/L (ref 135–145)

## 2023-11-03 LAB — CBC
HCT: 39.2 % (ref 36.0–46.0)
Hemoglobin: 12.3 g/dL (ref 12.0–15.0)
MCH: 28.3 pg (ref 26.0–34.0)
MCHC: 31.4 g/dL (ref 30.0–36.0)
MCV: 90.1 fL (ref 80.0–100.0)
Platelets: 181 K/uL (ref 150–400)
RBC: 4.35 MIL/uL (ref 3.87–5.11)
RDW: 15.9 % — ABNORMAL HIGH (ref 11.5–15.5)
WBC: 11.2 K/uL — ABNORMAL HIGH (ref 4.0–10.5)
nRBC: 0 % (ref 0.0–0.2)

## 2023-11-03 LAB — GLUCOSE, CAPILLARY
Glucose-Capillary: 179 mg/dL — ABNORMAL HIGH (ref 70–99)
Glucose-Capillary: 189 mg/dL — ABNORMAL HIGH (ref 70–99)
Glucose-Capillary: 322 mg/dL — ABNORMAL HIGH (ref 70–99)
Glucose-Capillary: 259 mg/dL — ABNORMAL HIGH (ref 70–99)

## 2023-11-03 NOTE — Progress Notes (Signed)
 Mobility Specialist Progress Note;   11/03/23 1019  Mobility  Activity Ambulated with assistance  Level of Assistance Contact guard assist, steadying assist  Assistive Device Four wheel walker  Distance Ambulated (ft) 150 ft  Activity Response Tolerated well  Mobility Referral Yes  Mobility visit 1 Mobility  Mobility Specialist Start Time (ACUTE ONLY) 1019  Mobility Specialist Stop Time (ACUTE ONLY) 1047  Mobility Specialist Time Calculation (min) (ACUTE ONLY) 28 min   Pt agreeable to mobility. On 2LO2 upon arrival. Required MinG assistance during ambulation for safety. Took 2x seated rest breaks; one on the toilet and the second one after 26ft of ambulation. Ambulated on 3LO2, SPO2 93%> when able to receive accurate pleth. Pt returned to sitting on EoB and left with all needs met. Daughter present.   Lauraine Erm Mobility Specialist Please contact via SecureChat or [employer] [phone]

## 2023-11-03 NOTE — Progress Notes (Signed)
   11/03/23 2318  Vent Select  Invasive or Noninvasive Noninvasive  Adult Vent Y  Adult Ventilator Settings  Vent Type Servo-air  Vent Mode BIPAP;PSV  FiO2 (%) 35 %  Pressure Support 5 cmH20  PEEP 5 cmH20  Adult Ventilator Measurements  Peak Airway Pressure 10 L/min  Mean Airway Pressure 6.2 cmH20  Resp Rate Spontaneous 18 br/min  Resp Rate Total 18 br/min  Spont TV 471 mL  Measured Ve 8.2 L  Auto PEEP 0 cmH20  Total PEEP 5 cmH20  Adult Ventilator Alarms  Alarms On Y  Ve High Alarm 18 L/min  Ve Low Alarm 4 L/min  Resp Rate High Alarm 38 br/min  Resp Rate Low Alarm 8  PEEP Low Alarm 2 cmH2O  Press High Alarm 25 cmH2O  VAP Prevention  HOB> 30 Degrees Y (high fowlers)

## 2023-11-03 NOTE — Plan of Care (Signed)
  Problem: Clinical Measurements: Goal: Respiratory complications will improve Outcome: Progressing Goal: Cardiovascular complication will be avoided Outcome: Progressing   Problem: Activity: Goal: Risk for activity intolerance will decrease Outcome: Progressing   Problem: Nutrition: Goal: Adequate nutrition will be maintained Outcome: Progressing   Problem: Safety: Goal: Ability to remain free from injury will improve Outcome: Progressing   Problem: Respiratory: Goal: Ability to maintain adequate ventilation will improve Outcome: Progressing Goal: Ability to maintain a clear airway will improve Outcome: Progressing

## 2023-11-03 NOTE — Progress Notes (Signed)
 [facility]  PROGRESS NOTE  [name] FMW:[id number] DOB: 08/08/1945 DOA: 10/26/2023 PCP: Larnell Hamilton, MD   Brief HPI:    78 y.o. 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  She was last hospitalized from 8/6-9 with an acute exacerbation of pulmonary fibrosis and responded well to IV Solumedrol, empiric antibiotics, nebulizers, supplemental O2. She was discharged on PO Augmentin  + Azithromycin  and a prednisone  taper.  She is currently on 4L home O2.    Presents with confusion and worsening somnolence.  Found to be hypercarbic and transferred to the progressive care unit and placed on BiPAP.  Pulmonary consulted and changed her back to oral prednisone .  Working her up for encephalopathy otherwise as below.  PT/OT recommending SNF now.     Assessment/Plan:   Hypersomnolence/chronic hypoxemic and hypercapnic respiratory failure -Somnolence resolved after using BiPAP -Presented with hypersomnolence and altered mental status - Likely in setting of hypercapnic respiratory failure, Peace due to elevated at 84 per VBG from 10/27/2023 - pCO2 improved to 75 with BiPAP; continue BiPAP as tolerated - Patient is more alert and awake.  Answering question appropriately. - Pulmonology saw the patient, recommended to send home with BiPAP - Awaiting BiPAP to be delivered at home by insurance company; will likely have BiPAP delivered in next 2 to 3 days  Acute encephalopathy - Likely in setting of above - TSH 0.126, B12 1384, HIV nonreactive   Checked EEG and showed that the study was suggestive of moderate to severe diffuse encephalopathy with no seizures or epileptiform discharges seen throughout the recording.  -MRI brain showed no acute abnormality - Started on lactulose  for high ammonia, repeat ammonia level 33.  Lactulose   discontinued  Acute on chronic HFpEF -Appears fluid overload -BNP 323.5 - Patient takes Lasix  40 mg p.o. twice daily at home - She was given  Lasix  40 mg IV every 12 hours and assess diuretic response - Diuresed well with IV Lasix , will hold IV Lasix  - Continue Lasix  40 mg p.o. once daily; dose of Lasix  was changed due to worsening contraction catabolic alkalosis  Recent pneumonia -Treated with antibiotics and steroids -Procalcitonin less than 0.10 -Currently on cefepime  - IV antibiotics stopped on 10/30/2023   ILD Flare: Patient with recent hospitalization for ILD flare.  -She was treated with course of steroids and antibiotics - Currently on prednisone   Metabolic alkalosis -Resolved after Diamox  500 mg IV x 1 dose - CO2 elevated at 43, likely contraction alkalosis from diuresis as well as compensation from respiratory acidosis - VBG confirms bicarb of 46.7, likely has a compensation for respiratory acidosis - Continue Lasix  40 mg p.o. daily    Hypokalemia - Replete   Chronic HFpEF:  Lasix  restarted as above   CAD  S/p CABG: No c/o CP   Afib:  Has pacemaker and Rate controlled with Toprol  XL. C/w Anticoagulation w/ Apixaban    HLD:  On Repatha  at home   DMType 2: A1c 7.4 Continue sliding scale insulin  with NovoLog  -CBG well-controlled  Normocytic anemia - CTM for S/Sx of Bleeding; No overt bleeding noted.  - Hemoglobin stable at 11.2    Class II Obesity: Complicates overall prognosis and care. Estimated body mass index is 36.79 kg/m as calculated from the following:   Height as of this encounter: 5' 3 (1.6 m).   Weight as of this encounter: 94.2 kg. Weight  Loss and Dietary Counseling given    Medications     apixaban   5 mg Oral BID   benzonatate   100 mg Oral BID   docusate sodium   100 mg Oral BID   furosemide   40 mg Oral Daily   insulin  aspart  0-20 Units Subcutaneous TID WC   insulin  aspart  0-5 Units Subcutaneous QHS   ipratropium-albuterol   3 mL  Nebulization BID   metoprolol  succinate  50 mg Oral Daily   predniSONE   10 mg Oral BID WC   sodium chloride  flush  3 mL Intravenous Q12H     Data Reviewed:   CBG:  Recent Labs  Lab 11/02/23 1138 11/02/23 1602 11/02/23 2109 11/03/23 0821 11/03/23 1208  GLUCAP 259* 182* 248* 179* 189*    SpO2: 98 % O2 Flow Rate (L/min): 2 L/min FiO2 (%): (S) 35 %    Vitals:   11/03/23 0457 11/03/23 0812 11/03/23 0814 11/03/23 1210  BP:   (!) 126/52 (!) 118/53  Pulse:   66 61  Resp:   17 13  Temp:   98.2 F (36.8 C) 97.9 F (36.6 C)  TempSrc:   Oral Oral  SpO2: 98% 98% 100% 98%  Weight:      Height:          Data Reviewed:  Basic Metabolic Panel: Recent Labs  Lab 10/28/23 0358 10/29/23 0437 10/30/23 0455 11/01/23 1057 11/02/23 0623 11/03/23 0414  NA 137 138 143 138 139 135  K 4.2 4.1 3.8 3.3* 3.4* 4.3  CL 97* 95* 95* 89* 86* 91*  CO2 31 35* 37* 41* 43* 34*  GLUCOSE 165* 166* 177* 217* 171* 202*  BUN 42* 35* 25* 17 19 20   CREATININE 1.08* 0.94 0.83 0.94 0.89 0.79  CALCIUM  9.2 8.9 9.0 9.1 8.9 9.0  MG 2.4  --   --   --   --   --   PHOS 3.2  --   --   --   --   --     CBC: Recent Labs  Lab 10/28/23 0358 10/29/23 0437 11/03/23 0414  WBC 11.7* 8.3 11.2*  NEUTROABS 9.3*  --   --   HGB 11.2* 10.9* 12.3  HCT 36.4 35.5* 39.2  MCV 91.0 90.6 90.1  PLT 308 251 181    LFT Recent Labs  Lab 10/28/23 0358  AST 25  ALT 19  ALKPHOS 52  BILITOT 0.8  PROT 6.3*  ALBUMIN 2.9*     Antibiotics: Anti-infectives (From admission, onward)    Start     Dose/Rate Route Frequency Ordered Stop   10/29/23 2200  cefTRIAXone  (ROCEPHIN ) 2 g in sodium chloride  0.9 % 100 mL IVPB  Status:  Discontinued        2 g 200 mL/hr over 30 Minutes Intravenous Every 24 hours 10/29/23 0926 10/30/23 1153   10/28/23 1245  ceFEPIme  (MAXIPIME ) 2 g in sodium chloride  0.9 % 100 mL IVPB  Status:  Discontinued        2 g 200 mL/hr over 30 Minutes Intravenous Every 12 hours 10/28/23 1150 10/29/23  0926   10/27/23 1000  vancomycin  (VANCOREADY) IVPB 750 mg/150 mL  Status:  Discontinued        750 mg 150 mL/hr over 60 Minutes Intravenous Every 24 hours 10/26/23 1943 10/27/23 1324   10/27/23 0200  ceFEPIme  (MAXIPIME ) 2 g in sodium chloride  0.9 % 100 mL IVPB  Status:  Discontinued        2 g 200 mL/hr over  30 Minutes Intravenous Every 12 hours 10/26/23 1943 10/27/23 1810   10/26/23 1430  vancomycin  (VANCOCIN ) IVPB 1000 mg/200 mL premix  Status:  Discontinued        1,000 mg 200 mL/hr over 60 Minutes Intravenous  Once 10/26/23 1421 10/26/23 1424   10/26/23 1430  ceFEPIme  (MAXIPIME ) 2 g in sodium chloride  0.9 % 100 mL IVPB        2 g 200 mL/hr over 30 Minutes Intravenous  Once 10/26/23 1421 10/26/23 1504   10/26/23 1430  vancomycin  (VANCOREADY) IVPB 1500 mg/300 mL        1,500 mg 150 mL/hr over 120 Minutes Intravenous  Once 10/26/23 1424 10/26/23 1750        DVT prophylaxis: Apixaban   Code Status: DNR  Family Communication:    CONSULTS    Subjective   Breathing has significantly improved.  Diuresing well with Lasix .   Objective    Physical Examination:  Appears in no acute distress Medical record Lungs clear to auscultation bilaterally Extremities no edema  Status is: Inpatient:             Sabas GORMAN Brod   [facility] If 7PM-7AM, please contact night-coverage at [email or web address], Office  [phone]   11/03/2023, 2:16 PM  LOS: 8 days

## 2023-11-04 DIAGNOSIS — J849 Interstitial pulmonary disease, unspecified: Secondary | ICD-10-CM | POA: Diagnosis not present

## 2023-11-04 LAB — GLUCOSE, CAPILLARY
Glucose-Capillary: 178 mg/dL — ABNORMAL HIGH (ref 70–99)
Glucose-Capillary: 200 mg/dL — ABNORMAL HIGH (ref 70–99)
Glucose-Capillary: 301 mg/dL — ABNORMAL HIGH (ref 70–99)
Glucose-Capillary: 169 mg/dL — ABNORMAL HIGH (ref 70–99)

## 2023-11-04 NOTE — TOC Progression Note (Signed)
 Transition of Care (TOC) - Progression Note  Rayfield Gobble RN, BSN Inpatient Care Management Unit 4E- RN Case Manager See Treatment Team for direct phone # [phone] cross Coverage  Patient Details  Name: [name] MRN: [id number] Date of Birth: 08-31-1945  Transition of Care [facility]) CM/SW Contact  Gobble Rayfield Hurst, RN Phone Number: 11/04/2023, 2:02 PM  Clinical Narrative:    Per notes NIV still pending insurance auth.  Call made to [employer] liaison- Lynwood- to check on status- per Lynwood the [employer] portal still showing NIV pending, he did indicate however that the portal shows the hospital bed and lift as being approved. Liaison voiced that they have spoken w/ pt/family and they indicated that they do not want lift at this time.   CM also reached out to [facility]- who is the [facility] contracted third part for all DME. Spoke with customer service rep.GLENWOOD Givens. Per her research in their system- NIV is still pending approval by [facility].  She sent request for her team to f/u with email to this CM.  Email was received from [place] with [employer]- indicating that [facility] should have documents needed to make decision on NIV- and they are hoping for decision soon although she did indicate that it can take 7-14 business days to get a decision. If denied pt does have the option for appeal.   Call made to daughter [name] to update, she confirmed that she had told [employer] that they only want the bed at this time, no lift- Dawn will follow up w/ [employer] regarding setting up delivery for bed.  Dawn also indicated that she had called and spoken with [facility] this am regarding the pending NIV auth.   CM will continue to follow    Expected Discharge Plan: Home w Home Health Services Barriers to Discharge: No Barriers Identified               Expected Discharge Plan and Services In-house Referral: Clinical Social Work Discharge Planning Services: CM Consult Post Acute Care Choice: Home Health, Durable Medical  Equipment Living arrangements for the past 2 months: Single Family Home                 DME Arranged: Hospital bed, NIV DME Agency: [facility] Date DME Agency Contacted: 10/29/23 Time DME Agency Contacted: 1027 Representative spoke with at DME Agency: [facility] HH Arranged: RN, Disease Management, PT, Nurse's Aide HH Agency: [facility] Date HH Agency Contacted: 10/29/23 Time HH Agency Contacted: 1028 Representative spoke with at [facility] Agency: Burnard   Social Drivers of Health (SDOH) Interventions SDOH Screenings   Food Insecurity: No Food Insecurity (10/26/2023)  Housing: Low Risk  (10/26/2023)  Transportation Needs: No Transportation Needs (10/26/2023)  Utilities: Not At Risk (10/26/2023)  Financial Resource Strain: Low Risk  (12/09/2018)  Social Connections: Unknown (10/26/2023)  Tobacco Use: Low Risk  (10/26/2023)    Readmission Risk Interventions    10/22/2023    4:58 PM  Readmission Risk Prevention Plan  Medication Screening Complete  Transportation Screening Complete

## 2023-11-04 NOTE — Progress Notes (Signed)
 Occupational Therapy Treatment Patient Details Name: RAHEL CARLTON MRN: [id number] DOB: [birth year] Today's Date: 11/04/2023   History of present illness Pt is a 78 y/o female presenting 8/11 with AMS and progressive weakness. Recent admission 8/6-8/9 for ILD flare. PMH:  chronic resp failure on 2L, ILD, CHF, CAD s/p CABG, PAF, SSS s/p PPM, T2DM   OT comments  Pt progressing well towards goals. Pt progressed to  complete standing grooming tasks at the sink with supervision for safety. Pt limited by pain and decreased standing tolerance. Educated pt on use of rollator in bathroom as seat at sink for energy conservation. Pt verbalized understanding. Continue to recommend HHOT to optimize independence levels. Will continue to follow acutely.      If plan is discharge home, recommend the following:  A lot of help with bathing/dressing/bathroom;A little help with walking and/or transfers;Help with stairs or ramp for entrance;Assistance with cooking/housework   Equipment Recommendations  None recommended by OT       Precautions / Restrictions Precautions Precautions: Fall Recall of Precautions/Restrictions: Intact Precaution/Restrictions Comments: monitor O2 (2-3 L O2 at baseline) Restrictions Weight Bearing Restrictions Per Provider Order: No       Mobility Bed Mobility Overal bed mobility: Needs Assistance       General bed mobility comments: Pt received and returned sitting EOB    Transfers Overall transfer level: Needs assistance Equipment used: Rolling walker (2 wheels) Transfers: Sit to/from Stand, Bed to chair/wheelchair/BSC Sit to Stand: Supervision     Step pivot transfers: Supervision     General transfer comment: S for safety     Balance Overall balance assessment: Needs assistance Sitting-balance support: No upper extremity supported, Feet supported Sitting balance-Leahy Scale: Fair     Standing balance support: Bilateral upper extremity supported, During  functional activity, Reliant on assistive device for balance, Single extremity supported Standing balance-Leahy Scale: Fair Standing balance comment: Benefits from UE support     ADL either performed or assessed with clinical judgement   ADL Overall ADL's : Needs assistance/impaired     Grooming: Wash/dry face;Oral care;Brushing hair;Applying deodorant;Supervision/safety;Standing;Sitting Grooming Details (indicate cue type and reason): Part of grooming tasks completed standing, pt completing the rest seated d/t pain     Toilet Transfer: Supervision/safety;Ambulation;Rolling walker (2 wheels) Toilet Transfer Details (indicate cue type and reason): S for safety         Functional mobility during ADLs: Rolling walker (2 wheels);Cueing for safety;Supervision/safety General ADL Comments: Pt with good recall of energy conservation strategies from previous sessions    Extremity/Trunk Assessment Upper Extremity Assessment Upper Extremity Assessment: Generalized weakness   Lower Extremity Assessment Lower Extremity Assessment: Defer to PT evaluation        Vision   Vision Assessment?: No apparent visual deficits         Communication Communication Communication: Impaired Factors Affecting Communication: Hearing impaired   Cognition Arousal: Alert Behavior During Therapy: WFL for tasks assessed/performed Cognition: No apparent impairments     Following commands: Intact        Cueing   Cueing Techniques: Verbal cues, Tactile cues        General Comments VSS on 3L, left resting on 2L    Pertinent Vitals/ Pain       Pain Assessment Pain Assessment: Faces Faces Pain Scale: Hurts little more Pain Location: Low back Pain Descriptors / Indicators: Discomfort, Sore Pain Intervention(s): Monitored during session   Frequency  Min 2X/week        Progress Toward Goals  OT Goals(current goals can now be found in the care plan section)  Progress towards OT goals:  Progressing toward goals  Acute Rehab OT Goals Patient Stated Goal: To go home OT Goal Formulation: With patient Time For Goal Achievement: 11/10/23 Potential to Achieve Goals: Good ADL Goals Pt Will Perform Eating: with set-up;sitting;bed level Pt Will Perform Grooming: with set-up;sitting;bed level Pt Will Perform Lower Body Bathing: with min assist Pt Will Perform Lower Body Dressing: with min assist Pt Will Transfer to Toilet: with mod assist;with +2 assist;stand pivot transfer;bedside commode Pt Will Perform Toileting - Clothing Manipulation and hygiene: with min assist;with contact guard assist;sitting/lateral leans;sit to/from stand Additional ADL Goal #1: Pt to complete bed mobility with Min A in prep for EOB/OOB ADLs  Plan         AM-PAC OT 6 Clicks Daily Activity     Outcome Measure   Help from another person eating meals?: None Help from another person taking care of personal grooming?: A Little Help from another person toileting, which includes using toliet, bedpan, or urinal?: A Little Help from another person bathing (including washing, rinsing, drying)?: A Lot Help from another person to put on and taking off regular upper body clothing?: A Little Help from another person to put on and taking off regular lower body clothing?: A Little 6 Click Score: 18    End of Session Equipment Utilized During Treatment: Gait belt;Rolling walker (2 wheels);Oxygen   OT Visit Diagnosis: Unsteadiness on feet (R26.81);Other abnormalities of gait and mobility (R26.89);Muscle weakness (generalized) (M62.81);Other symptoms and signs involving cognitive function   Activity Tolerance Patient tolerated treatment well   Patient Left in bed;with call bell/phone within reach   Nurse Communication Mobility status        Time: 8486-8465 OT Time Calculation (min): 21 min  Charges: OT General Charges $OT Visit: 1 Visit OT Treatments $Self Care/Home Management : 8-22 mins  Adrianne BROCKS, OT  Acute Rehabilitation Services Office [phone] Secure chat preferred   Adrianne GORMAN Savers 11/04/2023, 4:36 PM

## 2023-11-04 NOTE — Inpatient Diabetes Management (Addendum)
 Inpatient Diabetes Program Recommendations  AACE/ADA: New Consensus Statement on Inpatient Glycemic Control (2015)  Target Ranges:  Prepandial:   less than 140 mg/dL      Peak postprandial:   less than 180 mg/dL (1-2 hours)      Critically ill patients:  140 - 180 mg/dL   Lab Results  Component Value Date   GLUCAP 200 (H) 11/04/2023   HGBA1C 7.4 (H) 10/21/2023    Review of Glycemic Control  Latest Reference Range & Units 11/03/23 08:21 11/03/23 12:08 11/03/23 16:07 11/03/23 21:12 11/04/23 07:52 11/04/23 11:50  Glucose-Capillary 70 - 99 mg/dL 820 (H) 810 (H) 677 (H) 259 (H) 178 (H) 200 (H)  (H): Data is abnormally high Diabetes history: Type 2 DM Outpatient Diabetes medications: Farxiga 10 mg every day, Humalog  75/25 40 units TID Current orders for Inpatient glycemic control: Novolog  0-20 units TID & HS Prednisone  10 mg BID  Inpatient Diabetes Program Recommendations:    In the setting of steroids, consider adding Semglee  8 units every day and Novolog  3 units TID (Assuming patient is consuming >50% of meals)   Thanks, Tinnie Minus, MSN, RNC-OB Diabetes Coordinator [phone] (8a-5p)

## 2023-11-04 NOTE — Plan of Care (Signed)
  Problem: Clinical Measurements: Goal: Respiratory complications will improve Outcome: Progressing Goal: Cardiovascular complication will be avoided Outcome: Progressing   Problem: Activity: Goal: Risk for activity intolerance will decrease Outcome: Progressing   Problem: Nutrition: Goal: Adequate nutrition will be maintained Outcome: Progressing   Problem: Safety: Goal: Ability to remain free from injury will improve Outcome: Progressing   Problem: Activity: Goal: Ability to tolerate increased activity will improve Outcome: Progressing   Problem: Respiratory: Goal: Ability to maintain adequate ventilation will improve Outcome: Progressing Goal: Ability to maintain a clear airway will improve Outcome: Progressing

## 2023-11-04 NOTE — Progress Notes (Signed)
 PROGRESS NOTE    [name]  FMW:[id number] DOB: 12-06-1945 DOA: 10/26/2023 PCP: Larnell Hamilton, MD   Brief Narrative:  78 y.o. 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  She was last hospitalized from 8/6-9 with an acute exacerbation of pulmonary fibrosis and responded well to IV Solumedrol, empiric antibiotics, nebulizers, supplemental O2. She was discharged on PO Augmentin  + Azithromycin  and a prednisone  taper.  She is currently on 4L home O2.    Presents with confusion and worsening somnolence.  Found to be hypercarbic and transferred to the progressive care unit and placed on BiPAP.  Pulmonary consulted and changed her back to oral prednisone .  At this time patient is medically stable and agreeable for discharge home currently awaiting insurance approval for durable medical equipment to ensure safe disposition home given below.  Assessment & Plan:   Principal Problem:   ILD (interstitial lung disease) (HCC) Active Problems:   Pacemaker- March 2016    Hx of CABG 2005   Paroxysmal atrial fibrillation (HCC)   Class 2 obesity due to excess calories with body mass index (BMI) of 36.0 to 36.9 in adult   Mixed hyperlipidemia   Poorly controlled diabetes mellitus (HCC)   Hypertension   Chronic diastolic CHF (congestive heart failure) (HCC)   Healthcare-associated pneumonia  Hypersomnolence/chronic hypoxemic and hypercapnic respiratory failure -Somnolence resolved after using BiPAP -Presented with hypersomnolence and altered mental status - Likely in setting of hypercapnic respiratory failure, Peace due to elevated at 84 per VBG from 10/27/2023 - pCO2 improved to 75 with BiPAP; continue BiPAP as tolerated - Patient is more alert and awake.  Answering question appropriately. - Pulmonology saw the patient, recommended to send home with  BiPAP - Awaiting BiPAP to be delivered at home by insurance company; will likely have BiPAP delivered in next 2 to 3 days   Acute encephalopathy, resolved Rule out hyperammonemia - Likely in setting of above - TSH 0.126, B12 1384, HIV nonreactive - EEG and showed that the study was suggestive of moderate to severe diffuse encephalopathy with no seizures or epileptiform discharges seen throughout the recording.  -MRI brain without acute abnormality - Ammonia minimally elevated at 40, currently within normal limits, off lactulose    Acute on chronic HFpEF - Patient takes Lasix  40 mg p.o. twice daily at home -Clinically volume overloaded with elevated BNP at intake - She was given  Lasix  40 mg IV every 12 hours and assess diuretic response - Off IV Lasix , continue Lasix  40 mg p.o. once daily; dose of Lasix  was changed due to worsening contraction catabolic alkalosis   Recent pneumonia -Treated with antibiotics and steroids -Procalcitonin less than 0.10 -Currently on cefepime  - IV antibiotics stopped on 10/30/2023   ILD Flare: Patient with recent hospitalization for ILD flare.  -She was treated with course of steroids and antibiotics - Currently on prednisone    Metabolic alkalosis -Resolved after Diamox  500 mg IV x 1 dose - CO2 elevated at 43, likely contraction alkalosis from diuresis as well as compensation from respiratory acidosis - VBG confirms bicarb of 46.7, likely has a compensation for respiratory acidosis - Continue Lasix  40 mg p.o. daily    Hypokalemia - Replete as appropriate Chronic HFpEF: Lasix  restarted as above CAD S/p CABG: No c/o CP Afib: Has pacemaker and Rate controlled with Toprol  XL. C/w Anticoagulation w/ Apixaban  HLD: On Repatha  at home  DMType 2 uncontrolled with hyperglycemia: A1c 7.4 Normocytic anemia- CTM for S/Sx of Bleeding; No overt bleeding noted.   Class II Obesity: Complicates overall prognosis and care. Estimated body mass index is 36.79 kg/m as  calculated from the following:   Height as of this encounter: 5' 3 (1.6 m).   Weight as of this encounter: 94.2 kg. Weight Loss and Dietary Counseling given   DVT prophylaxis:  apixaban  (ELIQUIS ) tablet 5 mg   Code Status:   Code Status: Limited: Do not attempt resuscitation (DNR) -DNR-LIMITED -Do Not Intubate/DNI   Family Communication: At bedside  Status is: Inpatient  Dispo: The patient is from: Home              Anticipated d/c is to: Home              Anticipated d/c date is: Imminent              Patient currently is medically stable for discharge awaiting insurance approval of medical equipment to ensure safe disposition Home  Consultants:  Pulmonology  Procedures:  None  Antimicrobials:  None  Subjective: No acute issues or events overnight  Objective: Vitals:   11/03/23 2035 11/03/23 2100 11/04/23 0017 11/04/23 0441  BP:  (!) 172/62 (!) 164/63 (!) 158/60  Pulse:   (!) 59   Resp:  15 18 20   Temp:  97.8 F (36.6 C) 98.1 F (36.7 C) 97.8 F (36.6 C)  TempSrc:  Oral Axillary Axillary  SpO2: 93%  97%   Weight:      Height:        Intake/Output Summary (Last 24 hours) at 11/04/2023 0901 Last data filed at 11/04/2023 0443 Gross per 24 hour  Intake --  Output 500 ml  Net -500 ml   Filed Weights   10/26/23 1135  Weight: 94.2 kg    Examination:  General:  Pleasantly resting in bed, No acute distress. HEENT:  Normocephalic atraumatic.  Sclerae nonicteric, noninjected.  Extraocular movements intact bilaterally. Neck:  Without mass or deformity.  Trachea is midline. Lungs:  Clear to auscultate bilaterally without rhonchi, wheeze, or rales. Heart:  Regular rate and rhythm.  Scant rales Abdomen:  Soft, obese nontender, nondistended.  Without guarding or rebound.  Data Reviewed: I have personally reviewed following labs and imaging studies  CBC: Recent Labs  Lab 10/29/23 0437 11/03/23 0414  WBC 8.3 11.2*  HGB 10.9* 12.3  HCT 35.5* 39.2  MCV 90.6  90.1  PLT 251 181   Basic Metabolic Panel: Recent Labs  Lab 10/29/23 0437 10/30/23 0455 11/01/23 1057 11/02/23 0623 11/03/23 0414  NA 138 143 138 139 135  K 4.1 3.8 3.3* 3.4* 4.3  CL 95* 95* 89* 86* 91*  CO2 35* 37* 41* 43* 34*  GLUCOSE 166* 177* 217* 171* 202*  BUN 35* 25* 17 19 20   CREATININE 0.94 0.83 0.94 0.89 0.79  CALCIUM  8.9 9.0 9.1 8.9 9.0   GFR: Estimated Creatinine Clearance: 64.2 mL/min (by C-G formula based on SCr of 0.79 mg/dL). Liver Function Tests: No results for input(s): AST, ALT, ALKPHOS, BILITOT, PROT, ALBUMIN in the last 168 hours. No results for input(s): LIPASE, AMYLASE in the last 168 hours. No results for input(s): AMMONIA in the last 168 hours. Coagulation Profile: No results for input(s): INR, PROTIME in the last 168 hours. Cardiac Enzymes: No results for input(s): CKTOTAL, CKMB, CKMBINDEX, TROPONINI in the last 168 hours. BNP (last 3 results) No results for input(s): PROBNP in the last 8760  hours. HbA1C: No results for input(s): HGBA1C in the last 72 hours. CBG: Recent Labs  Lab 11/03/23 0821 11/03/23 1208 11/03/23 1607 11/03/23 2112 11/04/23 0752  GLUCAP 179* 189* 322* 259* 178*   Lipid Profile: No results for input(s): CHOL, HDL, LDLCALC, TRIG, CHOLHDL, LDLDIRECT in the last 72 hours. Thyroid  Function Tests: No results for input(s): TSH, T4TOTAL, FREET4, T3FREE, THYROIDAB in the last 72 hours. Anemia Panel: No results for input(s): VITAMINB12, FOLATE, FERRITIN, TIBC, IRON, RETICCTPCT in the last 72 hours. Sepsis Labs: No results for input(s): PROCALCITON, LATICACIDVEN in the last 168 hours.  Recent Results (from the past 240 hours)  Culture, blood (routine x 2)     Status: None   Collection Time: 10/26/23 12:00 PM   Specimen: BLOOD LEFT ARM  Result Value Ref Range Status   Specimen Description BLOOD LEFT ARM  Final   Special Requests   Final    BOTTLES DRAWN  AEROBIC AND ANAEROBIC Blood Culture results may not be optimal due to an inadequate volume of blood received in culture bottles   Culture   Final    NO GROWTH 5 DAYS Performed at [facility], [address], [place], [place] [address]    Report Status 10/31/2023 FINAL  Final  Culture, blood (routine x 2)     Status: None   Collection Time: 10/26/23 12:07 PM   Specimen: BLOOD RIGHT ARM  Result Value Ref Range Status   Specimen Description BLOOD RIGHT ARM  Final   Special Requests   Final    BOTTLES DRAWN AEROBIC ONLY Blood Culture results may not be optimal due to an inadequate volume of blood received in culture bottles   Culture   Final    NO GROWTH 5 DAYS Performed at [facility], [address] [place], [place] [address]    Report Status 10/31/2023 FINAL  Final  Resp panel by RT-PCR (RSV, Flu A&B, Covid) Anterior Nasal Swab     Status: None   Collection Time: 10/26/23 12:12 PM   Specimen: Anterior Nasal Swab  Result Value Ref Range Status   SARS Coronavirus 2 by RT PCR NEGATIVE NEGATIVE Final   Influenza A by PCR NEGATIVE NEGATIVE Final   Influenza B by PCR NEGATIVE NEGATIVE Final    Comment: (NOTE) The Xpert Xpress SARS-CoV-2/FLU/RSV plus assay is intended as an aid in the diagnosis of influenza from Nasopharyngeal swab specimens and should not be used as a sole basis for treatment. Nasal washings and aspirates are unacceptable for Xpert Xpress SARS-CoV-2/FLU/RSV testing.  Fact Sheet for Patients: BloggerCourse.com  Fact Sheet for Healthcare Providers: SeriousBroker.it  This test is not yet approved or cleared by the [place]  FDA and has been authorized for detection and/or diagnosis of SARS-CoV-2 by FDA under an Emergency Use Authorization (EUA). This EUA will remain in effect (meaning this test can be used) for the duration of the COVID-19 declaration under Section 564(b)(1) of the Act, 21  U.S.C. section 360bbb-3(b)(1), unless the authorization is terminated or revoked.     Resp Syncytial Virus by PCR NEGATIVE NEGATIVE Final    Comment: (NOTE) Fact Sheet for Patients: BloggerCourse.com  Fact Sheet for Healthcare Providers: SeriousBroker.it  This test is not yet approved or cleared by the [place]  FDA and has been authorized for detection and/or diagnosis of SARS-CoV-2 by FDA under an Emergency Use Authorization (EUA). This EUA will remain in effect (meaning this test can be used) for the duration of the COVID-19 declaration under  Section 564(b)(1) of the Act, 21 U.S.C. section 360bbb-3(b)(1), unless the authorization is terminated or revoked.  Performed at [facility], [address], [place], [place] [address]   MRSA Next Gen by PCR, Nasal     Status: None   Collection Time: 10/26/23  8:00 PM   Specimen: Nasal Mucosa; Nasal Swab  Result Value Ref Range Status   MRSA by PCR Next Gen NOT DETECTED NOT DETECTED Final    Comment: (NOTE) The GeneXpert MRSA Assay (FDA approved for NASAL specimens only), is one component of a comprehensive MRSA colonization surveillance program. It is not intended to diagnose MRSA infection nor to guide or monitor treatment for MRSA infections. Test performance is not FDA approved in patients less than 51 years old. Performed at [facility], [address] [place], [place] [address]          Radiology Studies: No results found.      Scheduled Meds:  apixaban   5 mg Oral BID   benzonatate   100 mg Oral BID   docusate sodium   100 mg Oral BID   furosemide   40 mg Oral Daily   insulin  aspart  0-20 Units Subcutaneous TID WC   insulin  aspart  0-5 Units Subcutaneous QHS   ipratropium-albuterol   3 mL Nebulization BID   metoprolol  succinate  50 mg Oral Daily   predniSONE   10 mg Oral BID WC   sodium chloride  flush  3 mL Intravenous Q12H   Continuous  Infusions:   LOS: 9 days   Time spent:  Elsie JAYSON Montclair, DO [facility]  If 7PM-7AM, please contact night-coverage [email or web address]  11/04/2023, 9:01 AM

## 2023-11-04 NOTE — Progress Notes (Signed)
 Physical Therapy Treatment Patient Details Name: [name] MRN: [id number] DOB: [birth year] Today's Date: 11/04/2023   History of Present Illness Pt is a 78 y/o female presenting 8/11 with AMS and progressive weakness. Recent admission 8/6-8/9 for ILD flare. PMH:  chronic resp failure on 2L, ILD, CHF, CAD s/p CABG, PAF, SSS s/p PPM, T2DM    PT Comments  Pt agreeable to session, able to make slow but steady progress with gait endurance and decreased assist with gait. Pt reports sitting up for 4 hours already this morning, but agreeable to gait this morning. She continues to demo deficits in endurance, requiring seated rest break after ~45 ft. SpO2 94% on 3L O2. Pt educated in LE exercises to maintain muscle activation when sitting EOB, encouraged to maintain progressive walking program on porch after d/c home. Will continue to follow to progress functional strength and endurance but pt able to return home with family support once cleared.     If plan is discharge home, recommend the following: Help with stairs or ramp for entrance;Assist for transportation;Direct supervision/assist for medications management;A little help with walking and/or transfers;Assistance with cooking/housework;Direct supervision/assist for financial management;Supervision due to cognitive status;A little help with bathing/dressing/bathroom   Can travel by private vehicle     Yes  Equipment Recommendations  None recommended by PT    Recommendations for Other Services       Precautions / Restrictions Precautions Precautions: Fall Recall of Precautions/Restrictions: Impaired Precaution/Restrictions Comments: monitor O2 (2-3 L O2 at baseline) Restrictions Weight Bearing Restrictions Per Provider Order: No     Mobility  Bed Mobility Overal bed mobility: Needs Assistance Bed Mobility: Supine to Sit     Supine to sit: HOB elevated, Used rails, Supervision     General bed mobility comments: Extra time to  transition supine to sit R EOB from elevated HOB, no cues needed, supervision for safety    Transfers Overall transfer level: Needs assistance Equipment used: Rolling walker (2 wheels) Transfers: Sit to/from Stand, Bed to chair/wheelchair/BSC Sit to Stand: Supervision   Step pivot transfers: Supervision       General transfer comment: cues for hand placement, able to complete with supervision and use of rollator x3 in session    Ambulation/Gait Ambulation/Gait assistance: Contact guard assist Gait Distance (Feet): 45 Feet (+ 45 ft) Assistive device: Rollator (4 wheels) Gait Pattern/deviations: Step-through pattern, Decreased stride length, Trunk flexed, Decreased step length - right, Decreased step length - left Gait velocity: reduced     General Gait Details: x2 bouts of 45 ft limited by back pain more than SOB. stable with BUE support on rollator, SpO2 94% on RA with good pleth   Stairs             Wheelchair Mobility     Tilt Bed    Modified Rankin (Stroke Patients Only)       Balance Overall balance assessment: Needs assistance Sitting-balance support: No upper extremity supported, Feet supported Sitting balance-Leahy Scale: Fair Sitting balance - Comments: static sitting EOB with supervision for safety   Standing balance support: Bilateral upper extremity supported, During functional activity, Reliant on assistive device for balance, Single extremity supported Standing balance-Leahy Scale: Poor Standing balance comment: Reliant on RW or minA to ambulate; able to reach off COG to perform pericare with CGA with x1 UE support, no LOB  Communication Communication Communication: Impaired Factors Affecting Communication: Hearing impaired  Cognition Arousal: Alert Behavior During Therapy: WFL for tasks assessed/performed   PT - Cognitive impairments: No apparent impairments                         Following  commands: Intact      Cueing Cueing Techniques: Verbal cues, Tactile cues  Exercises General Exercises - Lower Extremity Long Arc Quad: AROM, Both, 10 reps, Seated Hip Flexion/Marching: AROM, 10 reps, Both, Seated Heel Raises: AROM, Both, 10 reps, Seated    General Comments General comments (skin integrity, edema, etc.): VSS on 3L with gait      Pertinent Vitals/Pain Pain Assessment Pain Assessment: Faces Faces Pain Scale: Hurts even more Pain Location: buttocks Pain Descriptors / Indicators: Discomfort, Sore Pain Intervention(s): Limited activity within patient's tolerance, Monitored during session, Repositioned    Home Living                          Prior Function            PT Goals (current goals can now be found in the care plan section) Acute Rehab PT Goals Patient Stated Goal: get back to normal PT Goal Formulation: With patient/family Time For Goal Achievement: 11/10/23 Potential to Achieve Goals: Fair Progress towards PT goals: Progressing toward goals    Frequency    Min 2X/week      PT Plan      Co-evaluation              AM-PAC PT 6 Clicks Mobility   Outcome Measure  Help needed turning from your back to your side while in a flat bed without using bedrails?: A Little Help needed moving from lying on your back to sitting on the side of a flat bed without using bedrails?: A Little Help needed moving to and from a bed to a chair (including a wheelchair)?: A Little Help needed standing up from a chair using your arms (e.g., wheelchair or bedside chair)?: A Little Help needed to walk in hospital room?: A Little Help needed climbing 3-5 steps with a railing? : Total 6 Click Score: 16    End of Session Equipment Utilized During Treatment: Gait belt;Oxygen  Activity Tolerance: Patient tolerated treatment well Patient left: with call bell/phone within reach;with family/visitor present;in bed (sitting EOB) Nurse Communication:  Mobility status PT Visit Diagnosis: Other abnormalities of gait and mobility (R26.89);Muscle weakness (generalized) (M62.81);Other symptoms and signs involving the nervous system (R29.898);Unsteadiness on feet (R26.81);Difficulty in walking, not elsewhere classified (R26.2)     Time: 9087-9055 PT Time Calculation (min) (ACUTE ONLY): 32 min  Charges:    $Gait Training: 8-22 mins $Therapeutic Exercise: 8-22 mins PT General Charges $$ ACUTE PT VISIT: 1 Visit                     Izetta Call, PT, DPT   Acute Rehabilitation Department Office [phone] Secure Chat Communication Preferred   Izetta JULIANNA Call 11/04/2023, 10:38 AM

## 2023-11-05 DIAGNOSIS — J849 Interstitial pulmonary disease, unspecified: Secondary | ICD-10-CM | POA: Diagnosis not present

## 2023-11-05 LAB — GLUCOSE, CAPILLARY
Glucose-Capillary: 151 mg/dL — ABNORMAL HIGH (ref 70–99)
Glucose-Capillary: 177 mg/dL — ABNORMAL HIGH (ref 70–99)
Glucose-Capillary: 261 mg/dL — ABNORMAL HIGH (ref 70–99)
Glucose-Capillary: 246 mg/dL — ABNORMAL HIGH (ref 70–99)
Glucose-Capillary: 249 mg/dL — ABNORMAL HIGH (ref 70–99)

## 2023-11-05 MED ORDER — FUROSEMIDE 40 MG PO TABS
40.0000 mg | ORAL_TABLET | Freq: Two times a day (BID) | ORAL | Status: DC
  Administered 2023-11-05 – 2023-11-11 (×11): 40 mg via ORAL
  Filled 2023-11-05 (×12): qty 1

## 2023-11-05 NOTE — Progress Notes (Signed)
 Physical Therapy Treatment Patient Details Name: [name] MRN: [id number] DOB: 05-Feb-1946 Today's Date: 11/05/2023   History of Present Illness 77 yo female adm 10/26/23 with AMS, weakness, hypercarbic and acute encephalopathy. PMHx: Admission 8/6-8/9 with ILD flare. Chronic resp failure on 2L, ILD, CHF, CAD s/p CABG, PAF, SSS s/p PPM, T2DM    PT Comments  Pt pleasant and able to increase gait tolerance with decreased supplemental O2 this session. Pt educated for walking program, HEP and repeated sit to stands. Pt feels comfortable with D/c home and states she will continue to monitor SPO2. HHPT recommended.   SPO2 >95% throughout session on 2L   If plan is discharge home, recommend the following: Assist for transportation;A little help with walking and/or transfers;Assistance with cooking/housework;A little help with bathing/dressing/bathroom   Can travel by private vehicle     Yes  Equipment Recommendations  None recommended by PT    Recommendations for Other Services       Precautions / Restrictions Precautions Precautions: Fall;Other (comment) Recall of Precautions/Restrictions: Intact Precaution/Restrictions Comments: monitor O2 (2-3 L O2 at baseline)     Mobility  Bed Mobility Overal bed mobility: Modified Independent Bed Mobility: Supine to Sit     Supine to sit: HOB elevated, Used rails, Modified independent (Device/Increase time)     General bed mobility comments: HOB 35 degrees with rail    Transfers Overall transfer level: Needs assistance   Transfers: Sit to/from Stand Sit to Stand: Supervision           General transfer comment: pt able to stand from bed elevated to home height, BSC and rollator with assist for lines without physical assist. 5 repeated sit to stands <30 sec at recliner with limited UB support    Ambulation/Gait Ambulation/Gait assistance: Contact guard assist Gait Distance (Feet): 75 Feet Assistive device: Rolling walker  (2 wheels) Gait Pattern/deviations: Step-through pattern, Decreased stride length, Trunk flexed   Gait velocity interpretation: <1.31 ft/sec, indicative of household ambulator   General Gait Details: pt walked 15' 60', 55' respectively with seated rest, cues for breathing and maintained 95% on 2L   Stairs             Wheelchair Mobility     Tilt Bed    Modified Rankin (Stroke Patients Only)       Balance Overall balance assessment: Needs assistance Sitting-balance support: No upper extremity supported, Feet supported Sitting balance-Leahy Scale: Fair Sitting balance - Comments: EOB without support   Standing balance support: Bilateral upper extremity supported, During functional activity, Reliant on assistive device for balance, No upper extremity supported   Standing balance comment: static standing at sink, rollator for gait                            Communication Communication Communication: No apparent difficulties  Cognition Arousal: Alert Behavior During Therapy: WFL for tasks assessed/performed   PT - Cognitive impairments: No apparent impairments                         Following commands: Intact      Cueing Cueing Techniques: Verbal cues  Exercises      General Comments        Pertinent Vitals/Pain Pain Assessment Pain Assessment: No/denies pain    Home Living  Prior Function            PT Goals (current goals can now be found in the care plan section) Acute Rehab PT Goals Patient Stated Goal: get back to normal PT Goal Formulation: With patient Time For Goal Achievement: 11/12/23 Progress towards PT goals: Goals met and updated - see care plan    Frequency    Min 2X/week      PT Plan      Co-evaluation              AM-PAC PT 6 Clicks Mobility   Outcome Measure  Help needed turning from your back to your side while in a flat bed without using bedrails?:  None Help needed moving from lying on your back to sitting on the side of a flat bed without using bedrails?: A Little Help needed moving to and from a bed to a chair (including a wheelchair)?: A Little Help needed standing up from a chair using your arms (e.g., wheelchair or bedside chair)?: A Little Help needed to walk in hospital room?: A Little Help needed climbing 3-5 steps with a railing? : A Lot 6 Click Score: 18    End of Session Equipment Utilized During Treatment: Oxygen  Activity Tolerance: Patient tolerated treatment well Patient left: with call bell/phone within reach;in chair Nurse Communication: Mobility status PT Visit Diagnosis: Other abnormalities of gait and mobility (R26.89);Muscle weakness (generalized) (M62.81);Difficulty in walking, not elsewhere classified (R26.2)     Time: 0827-0904 PT Time Calculation (min) (ACUTE ONLY): 37 min  Charges:    $Gait Training: 8-22 mins $Therapeutic Activity: 8-22 mins PT General Charges $$ ACUTE PT VISIT: 1 Visit                     Lenoard SQUIBB, PT Acute Rehabilitation Services Office: [phone]    Lenoard NOVAK Conlee Sliter 11/05/2023, 10:52 AM

## 2023-11-05 NOTE — Progress Notes (Signed)
 PROGRESS NOTE    [name]  FMW:[id number] DOB: 25-Apr-1945 DOA: 10/26/2023 PCP: Larnell Hamilton, MD   Brief Narrative:  78 y.o. 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  She was last hospitalized from 8/6-9 with an acute exacerbation of pulmonary fibrosis and responded well to IV Solumedrol, empiric antibiotics, nebulizers, supplemental O2. She was discharged on PO Augmentin  + Azithromycin  and a prednisone  taper.  She is currently on 4L home O2.    Presents with confusion and worsening somnolence.  Found to be hypercarbic and transferred to the progressive care unit and placed on BiPAP.  Pulmonary consulted and changed her back to oral prednisone .  At this time patient is medically stable and agreeable for discharge home currently awaiting insurance approval for durable medical equipment to ensure safe disposition home given below.  Assessment & Plan:   Principal Problem:   ILD (interstitial lung disease) (HCC) Active Problems:   Pacemaker- March 2016    Hx of CABG 2005   Paroxysmal atrial fibrillation (HCC)   Class 2 obesity due to excess calories with body mass index (BMI) of 36.0 to 36.9 in adult   Mixed hyperlipidemia   Poorly controlled diabetes mellitus (HCC)   Hypertension   Chronic diastolic CHF (congestive heart failure) (HCC)   Healthcare-associated pneumonia  Hypersomnolence/chronic hypoxemic and hypercapnic respiratory failure -Somnolence resolved after using BiPAP -Presented with hypersomnolence and altered mental status - Likely in setting of hypercapnic respiratory failure, Peace due to elevated at 84 per VBG from 10/27/2023 - pCO2 improved to 75 with BiPAP; continue BiPAP as tolerated - Patient is more alert and awake.  Answering question appropriately. - Pulmonology saw the patient, recommended to send home with  BiPAP - Awaiting BiPAP to be delivered at home by insurance company; will likely have BiPAP delivered in next 2 to 3 days   Acute encephalopathy, resolved Rule out hyperammonemia - Likely in setting of above - TSH 0.126, B12 1384, HIV nonreactive - EEG and showed that the study was suggestive of moderate to severe diffuse encephalopathy with no seizures or epileptiform discharges seen throughout the recording.  -MRI brain without acute abnormality - Ammonia minimally elevated at 40, currently within normal limits, off lactulose    Acute on chronic HFpEF - Patient takes Lasix  40 mg p.o. twice daily at home -Clinically volume overloaded with elevated BNP at intake - She was given  Lasix  40 mg IV every 12 hours and assess diuretic response - Off IV Lasix , continue Lasix  40 mg p.o. once daily; dose of Lasix  was changed due to worsening contraction catabolic alkalosis   Recent pneumonia -Treated with antibiotics and steroids -Procalcitonin less than 0.10 -Currently on cefepime  - IV antibiotics stopped on 10/30/2023   ILD Flare: Patient with recent hospitalization for ILD flare.  -She was treated with course of steroids and antibiotics - Currently on prednisone    Metabolic alkalosis -Resolved after Diamox  500 mg IV x 1 dose - CO2 elevated at 43, likely contraction alkalosis from diuresis as well as compensation from respiratory acidosis - VBG confirms bicarb of 46.7, likely has a compensation for respiratory acidosis - Continue Lasix  40 mg p.o. daily    Hypokalemia - Replete as appropriate Chronic HFpEF: Lasix  restarted as above CAD S/p CABG: No c/o CP Afib: Has pacemaker and Rate controlled with Toprol  XL. C/w Anticoagulation w/ Apixaban  HLD: On Repatha  at home  DMType 2 uncontrolled with hyperglycemia: A1c 7.4 Normocytic anemia- CTM for S/Sx of Bleeding; No overt bleeding noted.   Class II Obesity: Complicates overall prognosis and care. Estimated body mass index is 36.79 kg/m as  calculated from the following:   Height as of this encounter: 5' 3 (1.6 m).   Weight as of this encounter: 94.2 kg. Weight Loss and Dietary Counseling given   DVT prophylaxis:  apixaban  (ELIQUIS ) tablet 5 mg   Code Status:   Code Status: Limited: Do not attempt resuscitation (DNR) -DNR-LIMITED -Do Not Intubate/DNI   Family Communication: At bedside  Status is: Inpatient  Dispo: The patient is from: Home              Anticipated d/c is to: Home              Anticipated d/c date is: Imminent              Patient currently is medically stable for discharge awaiting insurance approval of medical equipment to ensure safe disposition Home  Consultants:  Pulmonology  Procedures:  None  Antimicrobials:  None  Subjective: No acute issues or events overnight  Objective: Vitals:   11/04/23 2335 11/05/23 0419 11/05/23 0753 11/05/23 0803  BP: (!) 156/67 (!) 146/68 (!) 146/53   Pulse: (!) 59 60 60   Resp: 16 16 16    Temp: (!) 97.5 F (36.4 C) (!) 97.5 F (36.4 C) 97.6 F (36.4 C)   TempSrc: Oral Axillary Oral   SpO2: 99% 100% 98% 99%  Weight:      Height:        Intake/Output Summary (Last 24 hours) at 11/05/2023 0813 Last data filed at 11/05/2023 0421 Gross per 24 hour  Intake --  Output 200 ml  Net -200 ml   Filed Weights   10/26/23 1135  Weight: 94.2 kg    Examination:  General:  Pleasantly resting in bed, No acute distress. HEENT:  Normocephalic atraumatic.  Sclerae nonicteric, noninjected.  Extraocular movements intact bilaterally. Neck:  Without mass or deformity.  Trachea is midline. Lungs:  Clear to auscultate bilaterally without rhonchi, wheeze, or rales. Heart:  Regular rate and rhythm.  Scant rales Abdomen:  Soft, obese nontender, nondistended.  Without guarding or rebound.  Data Reviewed: I have personally reviewed following labs and imaging studies  CBC: Recent Labs  Lab 11/03/23 0414  WBC 11.2*  HGB 12.3  HCT 39.2  MCV 90.1  PLT 181    Basic Metabolic Panel: Recent Labs  Lab 10/30/23 0455 11/01/23 1057 11/02/23 0623 11/03/23 0414  NA 143 138 139 135  K 3.8 3.3* 3.4* 4.3  CL 95* 89* 86* 91*  CO2 37* 41* 43* 34*  GLUCOSE 177* 217* 171* 202*  BUN 25* 17 19 20   CREATININE 0.83 0.94 0.89 0.79  CALCIUM  9.0 9.1 8.9 9.0   GFR: Estimated Creatinine Clearance: 64.2 mL/min (by C-G formula based on SCr of 0.79 mg/dL). Liver Function Tests: No results for input(s): AST, ALT, ALKPHOS, BILITOT, PROT, ALBUMIN in the last 168 hours. No results for input(s): LIPASE, AMYLASE in the last 168 hours. No results for input(s): AMMONIA in the last 168 hours. Coagulation Profile: No results for input(s): INR, PROTIME in the last 168 hours. Cardiac Enzymes: No results for input(s): CKTOTAL, CKMB, CKMBINDEX, TROPONINI in the last 168 hours. BNP (last 3 results) No results for input(s): PROBNP in the last 8760 hours. HbA1C: No results for input(s): HGBA1C in the last 72 hours. CBG: Recent Labs  Lab 11/04/23 0752 11/04/23 1150 11/04/23 1633 11/04/23 2048 11/05/23 0750  GLUCAP 178* 200* 301* 169* 151*   Lipid Profile: No results for input(s): CHOL, HDL, LDLCALC, TRIG, CHOLHDL, LDLDIRECT in the last 72 hours. Thyroid  Function Tests: No results for input(s): TSH, T4TOTAL, FREET4, T3FREE, THYROIDAB in the last 72 hours. Anemia Panel: No results for input(s): VITAMINB12, FOLATE, FERRITIN, TIBC, IRON, RETICCTPCT in the last 72 hours. Sepsis Labs: No results for input(s): PROCALCITON, LATICACIDVEN in the last 168 hours.  Recent Results (from the past 240 hours)  Culture, blood (routine x 2)     Status: None   Collection Time: 10/26/23 12:00 PM   Specimen: BLOOD LEFT ARM  Result Value Ref Range Status   Specimen Description BLOOD LEFT ARM  Final   Special Requests   Final    BOTTLES DRAWN AEROBIC AND ANAEROBIC Blood Culture results may not be optimal due  to an inadequate volume of blood received in culture bottles   Culture   Final    NO GROWTH 5 DAYS Performed at [facility], [address], [place], [place] [address]    Report Status 10/31/2023 FINAL  Final  Culture, blood (routine x 2)     Status: None   Collection Time: 10/26/23 12:07 PM   Specimen: BLOOD RIGHT ARM  Result Value Ref Range Status   Specimen Description BLOOD RIGHT ARM  Final   Special Requests   Final    BOTTLES DRAWN AEROBIC ONLY Blood Culture results may not be optimal due to an inadequate volume of blood received in culture bottles   Culture   Final    NO GROWTH 5 DAYS Performed at [facility], [address], [place], [place] [address]    Report Status 10/31/2023 FINAL  Final  Resp panel by RT-PCR (RSV, Flu A&B, Covid) Anterior Nasal Swab     Status: None   Collection Time: 10/26/23 12:12 PM   Specimen: Anterior Nasal Swab  Result Value Ref Range Status   SARS Coronavirus 2 by RT PCR NEGATIVE NEGATIVE Final   Influenza A by PCR NEGATIVE NEGATIVE Final   Influenza B by PCR NEGATIVE NEGATIVE Final    Comment: (NOTE) The Xpert Xpress SARS-CoV-2/FLU/RSV plus assay is intended as an aid in the diagnosis of influenza from Nasopharyngeal swab specimens and should not be used as a sole basis for treatment. Nasal washings and aspirates are unacceptable for Xpert Xpress SARS-CoV-2/FLU/RSV testing.  Fact Sheet for Patients: BloggerCourse.com  Fact Sheet for Healthcare Providers: SeriousBroker.it  This test is not yet approved or cleared by the [place]  FDA and has been authorized for detection and/or diagnosis of SARS-CoV-2 by FDA under an Emergency Use Authorization (EUA). This EUA will remain in effect (meaning this test can be used) for the duration of the COVID-19 declaration under Section 564(b)(1) of the Act, 21 U.S.C. section 360bbb-3(b)(1), unless the authorization is terminated  or revoked.     Resp Syncytial Virus by PCR NEGATIVE NEGATIVE Final    Comment: (NOTE) Fact Sheet for Patients: BloggerCourse.com  Fact Sheet for Healthcare Providers: SeriousBroker.it  This test is not yet approved or cleared by the [place]  FDA and has been authorized for detection and/or diagnosis of SARS-CoV-2 by FDA under an Emergency Use Authorization (EUA). This EUA will remain in effect (meaning this test can be used) for the duration of the COVID-19 declaration under Section 564(b)(1) of the Act, 21 U.S.C. section 360bbb-3(b)(1), unless the authorization is terminated or revoked.  Performed at [facility], [address] [place], [place] [address]   MRSA Next Gen by PCR, Nasal     Status: None   Collection Time: 10/26/23  8:00 PM   Specimen: Nasal Mucosa; Nasal Swab  Result Value Ref Range Status   MRSA by PCR Next Gen NOT DETECTED NOT DETECTED Final    Comment: (NOTE) The GeneXpert MRSA Assay (FDA approved for NASAL specimens only), is one component of a comprehensive MRSA colonization surveillance program. It is not intended to diagnose MRSA infection nor to guide or monitor treatment for MRSA infections. Test performance is not FDA approved in patients less than 10 years old. Performed at [facility], [address], [place], [place] [address]          Radiology Studies: No results found.      Scheduled Meds:  apixaban   5 mg Oral BID   benzonatate   100 mg Oral BID   docusate sodium   100 mg Oral BID   furosemide   40 mg Oral Daily   insulin  aspart  0-20 Units Subcutaneous TID WC   insulin  aspart  0-5 Units Subcutaneous QHS   ipratropium-albuterol   3 mL Nebulization BID   metoprolol  succinate  50 mg Oral Daily   predniSONE   10 mg Oral BID WC   sodium chloride  flush  3 mL Intravenous Q12H   Continuous Infusions:   LOS: 10 days   Time spent:  Dana JAYSON Montclair,  DO [facility]  If 7PM-7AM, please contact night-coverage [email or web address]  11/05/2023, 8:13 AM

## 2023-11-05 NOTE — Plan of Care (Signed)
  Problem: Clinical Measurements: Goal: Respiratory complications will improve Outcome: Progressing Goal: Cardiovascular complication will be avoided Outcome: Progressing   Problem: Activity: Goal: Risk for activity intolerance will decrease Outcome: Progressing   Problem: Nutrition: Goal: Adequate nutrition will be maintained Outcome: Progressing   Problem: Safety: Goal: Ability to remain free from injury will improve Outcome: Progressing   Problem: Activity: Goal: Ability to tolerate increased activity will improve Outcome: Progressing   Problem: Respiratory: Goal: Ability to maintain adequate ventilation will improve Outcome: Progressing

## 2023-11-05 NOTE — TOC Progression Note (Signed)
 Transition of Care [facility]) - Progression Note    Patient Details  Name: [name] MRN: [id number] Date of Birth: 06-19-1945  Transition of Care [id number]) CM/SW Contact  Rosaline JONELLE Joe, RN Phone Number: 11/05/2023, 12:28 PM  Clinical Narrative:    CM called and spoke with [facility] Davina Breed and he states that insurance has approved hospital bed and hoyer lift but authorization is still pending for NIV.  Patient's family does not want the [facility] lift when closer to discharge to home.   Expected Discharge Plan: Home w Home Health Services Barriers to Discharge: No Barriers Identified               Expected Discharge Plan and Services In-house Referral: Clinical Social Work Discharge Planning Services: CM Consult Post Acute Care Choice: Home Health, Durable Medical Equipment Living arrangements for the past 2 months: Single Family Home                 DME Arranged: Hospital bed, NIV DME Agency: [facility] Date DME Agency Contacted: 10/29/23 Time DME Agency Contacted: 1027 Representative spoke with at DME Agency: [facility] HH Arranged: RN, Disease Management, PT, Nurse's Aide HH Agency: [facility] Date HH Agency Contacted: 10/29/23 Time HH Agency Contacted: 1028 Representative spoke with at [facility] Agency: [facility]   Social Drivers of Health (SDOH) Interventions SDOH Screenings   Food Insecurity: No Food Insecurity (10/26/2023)  Housing: Low Risk  (10/26/2023)  Transportation Needs: No Transportation Needs (10/26/2023)  Utilities: Not At Risk (10/26/2023)  Financial Resource Strain: Low Risk  (12/09/2018)  Social Connections: Unknown (10/26/2023)  Tobacco Use: Low Risk  (10/26/2023)    Readmission Risk Interventions    10/22/2023    4:58 PM  Readmission Risk Prevention Plan  Medication Screening Complete  Transportation Screening Complete

## 2023-11-05 NOTE — Progress Notes (Signed)
 Mobility Specialist Progress Note:   11/05/23 1145  Mobility  Activity Ambulated with assistance  Level of Assistance Contact guard assist, steadying assist  Assistive Device Front wheel walker  Distance Ambulated (ft) 20 ft  Activity Response Tolerated well  Mobility Referral Yes  Mobility visit 1 Mobility  Mobility Specialist Start Time (ACUTE ONLY) 1145  Mobility Specialist Stop Time (ACUTE ONLY) 1200  Mobility Specialist Time Calculation (min) (ACUTE ONLY) 15 min   Pt requesting to use BR and transfer back to bed. Required only minG to ambulate to BR with RW. No unsteadiness noted. Pt left with all needs met.   Therisa Rana Mobility Specialist Please contact via SecureChat or  Rehab office at [phone]

## 2023-11-06 DIAGNOSIS — J849 Interstitial pulmonary disease, unspecified: Secondary | ICD-10-CM | POA: Diagnosis not present

## 2023-11-06 LAB — GLUCOSE, CAPILLARY
Glucose-Capillary: 239 mg/dL — ABNORMAL HIGH (ref 70–99)
Glucose-Capillary: 209 mg/dL — ABNORMAL HIGH (ref 70–99)
Glucose-Capillary: 245 mg/dL — ABNORMAL HIGH (ref 70–99)
Glucose-Capillary: 284 mg/dL — ABNORMAL HIGH (ref 70–99)

## 2023-11-06 NOTE — Progress Notes (Signed)
 PROGRESS NOTE    [name]  FMW:[id number] DOB: 05-04-1945 DOA: 10/26/2023 PCP: Larnell Hamilton, MD   Brief Narrative:  78 y.o. 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  She was last hospitalized from 8/6-9 with an acute exacerbation of pulmonary fibrosis and responded well to IV Solumedrol, empiric antibiotics, nebulizers, supplemental O2. She was discharged on PO Augmentin  + Azithromycin  and a prednisone  taper.  She is currently on 4L home O2.    Presents with confusion and worsening somnolence.  Found to be hypercarbic and transferred to the progressive care unit and placed on BiPAP.  Pulmonary consulted and changed her back to oral prednisone .  At this time patient is medically stable and agreeable for discharge home currently awaiting insurance approval for durable medical equipment to ensure safe disposition home given below.  Assessment & Plan:   Principal Problem:   ILD (interstitial lung disease) (HCC) Active Problems:   Pacemaker- March 2016    Hx of CABG 2005   Paroxysmal atrial fibrillation (HCC)   Class 2 obesity due to excess calories with body mass index (BMI) of 36.0 to 36.9 in adult   Mixed hyperlipidemia   Poorly controlled diabetes mellitus (HCC)   Hypertension   Chronic diastolic CHF (congestive heart failure) (HCC)   Healthcare-associated pneumonia  Hypersomnolence/chronic hypoxemic and hypercapnic respiratory failure -Somnolence resolved after using BiPAP -Presented with hypersomnolence and altered mental status - Likely in setting of hypercapnic respiratory failure, [place] due to elevated at 84 per VBG from 10/27/2023 - pCO2 improved to 75 with BiPAP; continue BiPAP as tolerated - Patient is more alert and awake.  Answering question appropriately. - Pulmonology saw the patient, recommended to send home with  BiPAP - Awaiting BiPAP to be delivered at home by insurance company; will likely have BiPAP delivered in next 2 to 3 days   Acute encephalopathy, resolved Rule out hyperammonemia - Likely in setting of above - TSH 0.126, B12 1384, HIV nonreactive - EEG and showed that the study was suggestive of moderate to severe diffuse encephalopathy with no seizures or epileptiform discharges seen throughout the recording.  -MRI brain without acute abnormality - Ammonia minimally elevated at 40, currently within normal limits, off lactulose    Acute on chronic HFpEF - Patient takes Lasix  40 mg p.o. twice daily at home -Clinically volume overloaded with elevated BNP at intake - She was given  Lasix  40 mg IV every 12 hours and assess diuretic response - Off IV Lasix , continue Lasix  40 mg p.o. once daily; dose of Lasix  was changed due to worsening contraction catabolic alkalosis   Recent pneumonia -Treated with antibiotics and steroids -Procalcitonin less than 0.10 -Currently on cefepime  - IV antibiotics stopped on 10/30/2023   ILD Flare: Patient with recent hospitalization for ILD flare.  -She was treated with course of steroids and antibiotics - Currently on prednisone    Metabolic alkalosis -Resolved after Diamox  500 mg IV x 1 dose - CO2 elevated at 43, likely contraction alkalosis from diuresis as well as compensation from respiratory acidosis - VBG confirms bicarb of 46.7, likely has a compensation for respiratory acidosis - Continue Lasix  40 mg p.o. daily    Hypokalemia - Replete as appropriate Chronic HFpEF: Lasix  restarted as above CAD S/p CABG: No c/o CP Afib: Has pacemaker and Rate controlled with Toprol  XL. C/w Anticoagulation w/ Apixaban  HLD: On Repatha  at home  DMType 2 uncontrolled with hyperglycemia: A1c 7.4 Normocytic anemia- CTM for S/Sx of Bleeding; No overt bleeding noted.   Class II Obesity: Complicates overall prognosis and care. Estimated body mass index is 36.79 kg/m as  calculated from the following:   Height as of this encounter: 5' 3 (1.6 m).   Weight as of this encounter: 94.2 kg. Weight Loss and Dietary Counseling given   DVT prophylaxis:  apixaban  (ELIQUIS ) tablet 5 mg   Code Status:   Code Status: Limited: Do not attempt resuscitation (DNR) -DNR-LIMITED -Do Not Intubate/DNI   Family Communication: At bedside  Status is: Inpatient  Dispo: The patient is from: Home              Anticipated d/c is to: Home              Anticipated d/c date is: Imminent              Patient currently is medically stable for discharge awaiting insurance approval of medical equipment to ensure safe disposition Home  Consultants:  Pulmonology  Procedures:  None  Antimicrobials:  None  Subjective: No acute issues or events overnight  Objective: Vitals:   11/05/23 1556 11/05/23 2034 11/05/23 2313 11/06/23 0423  BP: (!) 155/68 (!) 149/51 (!) 152/61 (!) 170/71  Pulse: 74 60 61 (!) 59  Resp: 16 16 16 16   Temp: 98.4 F (36.9 C) 98.3 F (36.8 C) 98.3 F (36.8 C) 98.3 F (36.8 C)  TempSrc: Oral Oral Oral Oral  SpO2: 96% 94% 94% 98%  Weight:      Height:        Intake/Output Summary (Last 24 hours) at 11/06/2023 0755 Last data filed at 11/06/2023 0427 Gross per 24 hour  Intake 483 ml  Output 850 ml  Net -367 ml   Filed Weights   10/26/23 1135  Weight: 94.2 kg    Examination:  General:  Pleasantly resting in bed, No acute distress. HEENT:  Normocephalic atraumatic.  Sclerae nonicteric, noninjected.  Extraocular movements intact bilaterally. Neck:  Without mass or deformity.  Trachea is midline. Lungs:  Clear to auscultate bilaterally without rhonchi, wheeze, or rales. Heart:  Regular rate and rhythm.  Scant rales Abdomen:  Soft, obese nontender, nondistended.  Without guarding or rebound.  Data Reviewed: I have personally reviewed following labs and imaging studies  CBC: Recent Labs  Lab 11/03/23 0414  WBC 11.2*  HGB 12.3  HCT 39.2   MCV 90.1  PLT 181   Basic Metabolic Panel: Recent Labs  Lab 11/01/23 1057 11/02/23 0623 11/03/23 0414  NA 138 139 135  K 3.3* 3.4* 4.3  CL 89* 86* 91*  CO2 41* 43* 34*  GLUCOSE 217* 171* 202*  BUN 17 19 20   CREATININE 0.94 0.89 0.79  CALCIUM  9.1 8.9 9.0   GFR: Estimated Creatinine Clearance: 64.2 mL/min (by C-G formula based on SCr of 0.79 mg/dL). Liver Function Tests: No results for input(s): AST, ALT, ALKPHOS, BILITOT, PROT, ALBUMIN in the last 168 hours. No results for input(s): LIPASE, AMYLASE in the last 168 hours. No results for input(s): AMMONIA in the last 168 hours. Coagulation Profile: No results for input(s): INR, PROTIME in the last 168 hours. Cardiac Enzymes: No results for input(s): CKTOTAL, CKMB, CKMBINDEX, TROPONINI in the last 168 hours. BNP (last 3 results) No results for input(s): PROBNP in the last 8760 hours. HbA1C: No results for input(s): HGBA1C in the last 72 hours. CBG: Recent Labs  Lab 11/05/23 1202 11/05/23 1639 11/05/23  2039 11/05/23 2157 11/06/23 0710  GLUCAP 177* 261* 246* 249* 239*   Lipid Profile: No results for input(s): CHOL, HDL, LDLCALC, TRIG, CHOLHDL, LDLDIRECT in the last 72 hours. Thyroid  Function Tests: No results for input(s): TSH, T4TOTAL, FREET4, T3FREE, THYROIDAB in the last 72 hours. Anemia Panel: No results for input(s): VITAMINB12, FOLATE, FERRITIN, TIBC, IRON, RETICCTPCT in the last 72 hours. Sepsis Labs: No results for input(s): PROCALCITON, LATICACIDVEN in the last 168 hours.  No results found for this or any previous visit (from the past 240 hours).        Radiology Studies: No results found.      Scheduled Meds:  apixaban   5 mg Oral BID   benzonatate   100 mg Oral BID   docusate sodium   100 mg Oral BID   furosemide   40 mg Oral BID   insulin  aspart  0-20 Units Subcutaneous TID WC   insulin  aspart  0-5 Units Subcutaneous QHS    ipratropium-albuterol   3 mL Nebulization BID   metoprolol  succinate  50 mg Oral Daily   predniSONE   10 mg Oral BID WC   sodium chloride  flush  3 mL Intravenous Q12H   Continuous Infusions:   LOS: 11 days   Time spent:  Elsie JAYSON Montclair, DO [facility]  If 7PM-7AM, please contact night-coverage [email or web address]  11/06/2023, 7:55 AM

## 2023-11-06 NOTE — Plan of Care (Signed)
  Problem: Health Behavior/Discharge Planning: Goal: Ability to manage health-related needs will improve Outcome: Progressing   Problem: Clinical Measurements: Goal: Ability to maintain clinical measurements within normal limits will improve Outcome: Progressing Goal: Will remain free from infection Outcome: Progressing Goal: Diagnostic test results will improve Outcome: Progressing Goal: Respiratory complications will improve Outcome: Progressing Goal: Cardiovascular complication will be avoided Outcome: Progressing   Problem: Activity: Goal: Risk for activity intolerance will decrease Outcome: Progressing   Problem: Nutrition: Goal: Adequate nutrition will be maintained Outcome: Progressing   Problem: Coping: Goal: Level of anxiety will decrease Outcome: Progressing   Problem: Elimination: Goal: Will not experience complications related to bowel motility Outcome: Progressing Goal: Will not experience complications related to urinary retention Outcome: Progressing   Problem: Pain Managment: Goal: General experience of comfort will improve and/or be controlled Outcome: Progressing   Problem: Safety: Goal: Ability to remain free from injury will improve Outcome: Progressing   Problem: Skin Integrity: Goal: Risk for impaired skin integrity will decrease Outcome: Progressing   Problem: Activity: Goal: Ability to tolerate increased activity will improve Outcome: Progressing   Problem: Clinical Measurements: Goal: Ability to maintain a body temperature in the normal range will improve Outcome: Progressing   Problem: Respiratory: Goal: Ability to maintain adequate ventilation will improve Outcome: Progressing Goal: Ability to maintain a clear airway will improve Outcome: Progressing   Problem: Education: Goal: Ability to describe self-care measures that may prevent or decrease complications (Diabetes Survival Skills Education) will improve Outcome:  Progressing Goal: Individualized Educational Video(s) Outcome: Progressing   Problem: Coping: Goal: Ability to adjust to condition or change in health will improve Outcome: Progressing   Problem: Fluid Volume: Goal: Ability to maintain a balanced intake and output will improve Outcome: Progressing   Problem: Health Behavior/Discharge Planning: Goal: Ability to identify and utilize available resources and services will improve Outcome: Progressing Goal: Ability to manage health-related needs will improve Outcome: Progressing   Problem: Metabolic: Goal: Ability to maintain appropriate glucose levels will improve Outcome: Progressing   Problem: Nutritional: Goal: Maintenance of adequate nutrition will improve Outcome: Progressing Goal: Progress toward achieving an optimal weight will improve Outcome: Progressing   Problem: Skin Integrity: Goal: Risk for impaired skin integrity will decrease Outcome: Progressing   Problem: Tissue Perfusion: Goal: Adequacy of tissue perfusion will improve Outcome: Progressing

## 2023-11-06 NOTE — Plan of Care (Signed)
  Problem: Health Behavior/Discharge Planning: Goal: Ability to manage health-related needs will improve Outcome: Adequate for Discharge   Problem: Clinical Measurements: Goal: Ability to maintain clinical measurements within normal limits will improve Outcome: Adequate for Discharge Goal: Will remain free from infection Outcome: Adequate for Discharge Goal: Diagnostic test results will improve Outcome: Adequate for Discharge Goal: Respiratory complications will improve Outcome: Adequate for Discharge Goal: Cardiovascular complication will be avoided Outcome: Adequate for Discharge   Problem: Activity: Goal: Risk for activity intolerance will decrease Outcome: Adequate for Discharge   Problem: Coping: Goal: Level of anxiety will decrease Outcome: Adequate for Discharge   Problem: Nutrition: Goal: Adequate nutrition will be maintained Outcome: Adequate for Discharge   Problem: Elimination: Goal: Will not experience complications related to bowel motility Outcome: Adequate for Discharge Goal: Will not experience complications related to urinary retention Outcome: Adequate for Discharge   Problem: Safety: Goal: Ability to remain free from injury will improve Outcome: Adequate for Discharge   Problem: Activity: Goal: Ability to tolerate increased activity will improve Outcome: Adequate for Discharge

## 2023-11-06 NOTE — Progress Notes (Signed)
 Mobility Specialist: Progress Note   11/06/23 1600  Mobility  Activity Ambulated with assistance  Level of Assistance Standby assist, set-up cues, supervision of patient - no hands on  Assistive Device Four wheel walker  Distance Ambulated (ft) 100 ft  Activity Response Tolerated well  Mobility Referral Yes  Mobility visit 1 Mobility  Mobility Specialist Start Time (ACUTE ONLY) 1410  Mobility Specialist Stop Time (ACUTE ONLY) 1428  Mobility Specialist Time Calculation (min) (ACUTE ONLY) 18 min    Pt received in bed, agreeable to mobility session. SV throughout. Took 1x seated break d/t fatigued and SOB. SpO2 desat to 84-87% on 2LO2, recovered  to SpO2 90-93% with seated rest and PLB. Returned to room without fault. Left on EOB with all needs met, call bell in reach.   Ileana Lute Mobility Specialist Please contact via SecureChat or Rehab office at [phone]

## 2023-11-07 DIAGNOSIS — J849 Interstitial pulmonary disease, unspecified: Secondary | ICD-10-CM | POA: Diagnosis not present

## 2023-11-07 LAB — GLUCOSE, CAPILLARY
Glucose-Capillary: 262 mg/dL — ABNORMAL HIGH (ref 70–99)
Glucose-Capillary: 226 mg/dL — ABNORMAL HIGH (ref 70–99)
Glucose-Capillary: 270 mg/dL — ABNORMAL HIGH (ref 70–99)
Glucose-Capillary: 217 mg/dL — ABNORMAL HIGH (ref 70–99)
Glucose-Capillary: 138 mg/dL — ABNORMAL HIGH (ref 70–99)

## 2023-11-07 MED ORDER — PREDNISONE 10 MG PO TABS
10.0000 mg | ORAL_TABLET | Freq: Every day | ORAL | Status: DC
  Administered 2023-11-08 – 2023-11-11 (×4): 10 mg via ORAL
  Filled 2023-11-07: qty 2
  Filled 2023-11-07: qty 1
  Filled 2023-11-07 (×2): qty 2

## 2023-11-07 NOTE — Progress Notes (Signed)
 PROGRESS NOTE    [name]  FMW:[id number] DOB: [birth year] DOA: 10/26/2023 PCP: Larnell Hamilton, MD   Brief Narrative:  78 y.o. 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  She was last hospitalized from 8/6-9 with an acute exacerbation of pulmonary fibrosis and responded well to IV Solumedrol, empiric antibiotics, nebulizers, supplemental O2. She was discharged on PO Augmentin  + Azithromycin  and a prednisone  taper.  She is currently on 4L home O2.    Presents with confusion and worsening somnolence.  Found to be hypercarbic and transferred to the progressive care unit and placed on BiPAP.  Pulmonary consulted and changed her back to oral prednisone .  At this time patient is medically stable and agreeable for discharge home currently awaiting insurance approval for durable medical equipment to ensure safe disposition home given below.  Assessment & Plan:   Principal Problem:   ILD (interstitial lung disease) (HCC) Active Problems:   Pacemaker- March 2016    Hx of CABG 2005   Paroxysmal atrial fibrillation (HCC)   Class 2 obesity due to excess calories with body mass index (BMI) of 36.0 to 36.9 in adult   Mixed hyperlipidemia   Poorly controlled diabetes mellitus (HCC)   Hypertension   Chronic diastolic CHF (congestive heart failure) (HCC)   Healthcare-associated pneumonia  Hypersomnolence/chronic hypoxemic and hypercapnic respiratory failure -Somnolence resolved after using BiPAP -Presented with hypersomnolence and altered mental status - Likely in setting of hypercapnic respiratory failure, [place] due to elevated at 84 per VBG from 10/27/2023 - pCO2 improved to 75 with BiPAP; continue BiPAP as tolerated - Patient is more alert and awake.  Answering question appropriately. - Pulmonology saw the patient, recommended to send home with  BiPAP - Awaiting BiPAP to be delivered at home by insurance company; will likely have BiPAP delivered in next 2 to 3 days   Acute encephalopathy, resolved Rule out hyperammonemia - Likely in setting of above - TSH 0.126, B12 1384, HIV nonreactive - EEG and showed that the study was suggestive of moderate to severe diffuse encephalopathy with no seizures or epileptiform discharges seen throughout the recording.  -MRI brain without acute abnormality - Ammonia minimally elevated at 40, currently within normal limits, off lactulose    Acute on chronic HFpEF - Patient takes Lasix  40 mg p.o. twice daily at home -Clinically volume overloaded with elevated BNP at intake - She was given  Lasix  40 mg IV every 12 hours and assess diuretic response - Off IV Lasix , continue Lasix  40 mg p.o. once daily; dose of Lasix  was changed due to worsening contraction catabolic alkalosis   Recent pneumonia -Treated with antibiotics and steroids -Procalcitonin less than 0.10 -Currently on cefepime  - IV antibiotics stopped on 10/30/2023   ILD Flare: Patient with recent hospitalization for ILD flare.  -She was treated with course of steroids and antibiotics - Currently on prednisone    Metabolic alkalosis -Resolved after Diamox  500 mg IV x 1 dose - CO2 elevated at 43, likely contraction alkalosis from diuresis as well as compensation from respiratory acidosis - VBG confirms bicarb of 46.7, likely has a compensation for respiratory acidosis - Continue Lasix  40 mg p.o. daily    Hypokalemia - Replete as appropriate Chronic HFpEF: Lasix  restarted as above CAD S/p CABG: No c/o CP Afib: Has pacemaker and Rate controlled with Toprol  XL. C/w Anticoagulation w/ Apixaban  HLD: On Repatha  at home  DMType 2 uncontrolled with hyperglycemia: A1c 7.4 Normocytic anemia- CTM for S/Sx of Bleeding; No overt bleeding noted.   Class II Obesity: Complicates overall prognosis and care. Estimated body mass index is 36.79 kg/m as  calculated from the following:   Height as of this encounter: 5' 3 (1.6 m).   Weight as of this encounter: 94.2 kg. Weight Loss and Dietary Counseling given   DVT prophylaxis:  apixaban  (ELIQUIS ) tablet 5 mg   Code Status:   Code Status: Limited: Do not attempt resuscitation (DNR) -DNR-LIMITED -Do Not Intubate/DNI   Family Communication: At bedside  Status is: Inpatient  Dispo: The patient is from: Home              Anticipated d/c is to: Home              Anticipated d/c date is: Imminent              Patient currently is medically stable for discharge awaiting insurance approval of medical equipment to ensure safe disposition Home  Consultants:  Pulmonology  Procedures:  None  Antimicrobials:  None  Subjective: No acute issues or events overnight  Objective: Vitals:   11/06/23 2022 11/06/23 2357 11/07/23 0548 11/07/23 0743  BP: 124/62 (!) 178/76 (!) 145/55 (!) 147/53  Pulse: 61 65 60 64  Resp: 16 20 16 17   Temp: 97.8 F (36.6 C) (!) 97.5 F (36.4 C) 97.6 F (36.4 C) 97.6 F (36.4 C)  TempSrc: Oral Axillary Oral Oral  SpO2: 99% 100% 96% 96%  Weight:      Height:        Intake/Output Summary (Last 24 hours) at 11/07/2023 0801 Last data filed at 11/07/2023 0551 Gross per 24 hour  Intake 357 ml  Output 900 ml  Net -543 ml   Filed Weights   10/26/23 1135  Weight: 94.2 kg    Examination:  General:  Pleasantly resting in bed, No acute distress. HEENT:  Normocephalic atraumatic.  Sclerae nonicteric, noninjected.  Extraocular movements intact bilaterally. Neck:  Without mass or deformity.  Trachea is midline. Lungs:  Clear to auscultate bilaterally without rhonchi, wheeze, or rales. Heart:  Regular rate and rhythm.  Scant rales Abdomen:  Soft, obese nontender, nondistended.  Without guarding or rebound.  Data Reviewed: I have personally reviewed following labs and imaging studies  CBC: Recent Labs  Lab 11/03/23 0414  WBC 11.2*  HGB 12.3  HCT 39.2   MCV 90.1  PLT 181   Basic Metabolic Panel: Recent Labs  Lab 11/01/23 1057 11/02/23 0623 11/03/23 0414  NA 138 139 135  K 3.3* 3.4* 4.3  CL 89* 86* 91*  CO2 41* 43* 34*  GLUCOSE 217* 171* 202*  BUN 17 19 20   CREATININE 0.94 0.89 0.79  CALCIUM  9.1 8.9 9.0   GFR: Estimated Creatinine Clearance: 64.2 mL/min (by C-G formula based on SCr of 0.79 mg/dL). Liver Function Tests: No results for input(s): AST, ALT, ALKPHOS, BILITOT, PROT, ALBUMIN in the last 168 hours. No results for input(s): LIPASE, AMYLASE in the last 168 hours. No results for input(s): AMMONIA in the last 168 hours. Coagulation Profile: No results for input(s): INR, PROTIME in the last 168 hours. Cardiac Enzymes: No results for input(s): CKTOTAL, CKMB, CKMBINDEX, TROPONINI in the last 168 hours. BNP (last 3 results) No results for input(s): PROBNP in the last 8760 hours. HbA1C: No results for input(s): HGBA1C in the last 72 hours. CBG: Recent Labs  Lab 11/06/23 0710 11/06/23 1207 11/06/23 1557  11/06/23 2146 11/07/23 0740  GLUCAP 239* 209* 245* 284* 262*   Lipid Profile: No results for input(s): CHOL, HDL, LDLCALC, TRIG, CHOLHDL, LDLDIRECT in the last 72 hours. Thyroid  Function Tests: No results for input(s): TSH, T4TOTAL, FREET4, T3FREE, THYROIDAB in the last 72 hours. Anemia Panel: No results for input(s): VITAMINB12, FOLATE, FERRITIN, TIBC, IRON, RETICCTPCT in the last 72 hours. Sepsis Labs: No results for input(s): PROCALCITON, LATICACIDVEN in the last 168 hours.  No results found for this or any previous visit (from the past 240 hours).        Radiology Studies: No results found.      Scheduled Meds:  apixaban   5 mg Oral BID   benzonatate   100 mg Oral BID   docusate sodium   100 mg Oral BID   furosemide   40 mg Oral BID   insulin  aspart  0-20 Units Subcutaneous TID WC   insulin  aspart  0-5 Units Subcutaneous QHS    ipratropium-albuterol   3 mL Nebulization BID   metoprolol  succinate  50 mg Oral Daily   predniSONE   10 mg Oral BID WC   sodium chloride  flush  3 mL Intravenous Q12H   Continuous Infusions:   LOS: 12 days   Time spent:  Elsie JAYSON Montclair, DO [facility]  If 7PM-7AM, please contact night-coverage [email or web address]  11/07/2023, 8:01 AM

## 2023-11-07 NOTE — Plan of Care (Signed)
  Problem: Health Behavior/Discharge Planning: Goal: Ability to manage health-related needs will improve Outcome: Progressing   Problem: Clinical Measurements: Goal: Ability to maintain clinical measurements within normal limits will improve Outcome: Progressing Goal: Will remain free from infection Outcome: Progressing Goal: Diagnostic test results will improve Outcome: Progressing Goal: Respiratory complications will improve Outcome: Progressing Goal: Cardiovascular complication will be avoided Outcome: Progressing   Problem: Activity: Goal: Risk for activity intolerance will decrease Outcome: Progressing   Problem: Nutrition: Goal: Adequate nutrition will be maintained Outcome: Progressing   Problem: Coping: Goal: Level of anxiety will decrease Outcome: Progressing   Problem: Elimination: Goal: Will not experience complications related to bowel motility Outcome: Progressing Goal: Will not experience complications related to urinary retention Outcome: Progressing   Problem: Pain Managment: Goal: General experience of comfort will improve and/or be controlled Outcome: Progressing   Problem: Safety: Goal: Ability to remain free from injury will improve Outcome: Progressing   Problem: Skin Integrity: Goal: Risk for impaired skin integrity will decrease Outcome: Progressing   Problem: Activity: Goal: Ability to tolerate increased activity will improve Outcome: Progressing   Problem: Clinical Measurements: Goal: Ability to maintain a body temperature in the normal range will improve Outcome: Progressing   Problem: Respiratory: Goal: Ability to maintain adequate ventilation will improve Outcome: Progressing Goal: Ability to maintain a clear airway will improve Outcome: Progressing   Problem: Education: Goal: Ability to describe self-care measures that may prevent or decrease complications (Diabetes Survival Skills Education) will improve Outcome:  Progressing Goal: Individualized Educational Video(s) Outcome: Progressing   Problem: Coping: Goal: Ability to adjust to condition or change in health will improve Outcome: Progressing   Problem: Fluid Volume: Goal: Ability to maintain a balanced intake and output will improve Outcome: Progressing   Problem: Health Behavior/Discharge Planning: Goal: Ability to identify and utilize available resources and services will improve Outcome: Progressing Goal: Ability to manage health-related needs will improve Outcome: Progressing   Problem: Metabolic: Goal: Ability to maintain appropriate glucose levels will improve Outcome: Progressing   Problem: Nutritional: Goal: Maintenance of adequate nutrition will improve Outcome: Progressing Goal: Progress toward achieving an optimal weight will improve Outcome: Progressing   Problem: Skin Integrity: Goal: Risk for impaired skin integrity will decrease Outcome: Progressing   Problem: Tissue Perfusion: Goal: Adequacy of tissue perfusion will improve Outcome: Progressing

## 2023-11-07 NOTE — Plan of Care (Signed)
  Problem: Health Behavior/Discharge Planning: Goal: Ability to manage health-related needs will improve Outcome: Adequate for Discharge   Problem: Clinical Measurements: Goal: Ability to maintain clinical measurements within normal limits will improve Outcome: Adequate for Discharge Goal: Will remain free from infection Outcome: Adequate for Discharge Goal: Diagnostic test results will improve Outcome: Adequate for Discharge Goal: Respiratory complications will improve Outcome: Adequate for Discharge Goal: Cardiovascular complication will be avoided Outcome: Adequate for Discharge   Problem: Activity: Goal: Risk for activity intolerance will decrease Outcome: Adequate for Discharge   Problem: Nutrition: Goal: Adequate nutrition will be maintained Outcome: Adequate for Discharge   Problem: Coping: Goal: Level of anxiety will decrease Outcome: Adequate for Discharge   Problem: Elimination: Goal: Will not experience complications related to bowel motility Outcome: Adequate for Discharge Goal: Will not experience complications related to urinary retention Outcome: Adequate for Discharge   Problem: Pain Managment: Goal: General experience of comfort will improve and/or be controlled Outcome: Adequate for Discharge   Problem: Safety: Goal: Ability to remain free from injury will improve Outcome: Adequate for Discharge   Problem: Skin Integrity: Goal: Risk for impaired skin integrity will decrease Outcome: Adequate for Discharge   Problem: Activity: Goal: Ability to tolerate increased activity will improve Outcome: Adequate for Discharge   Problem: Clinical Measurements: Goal: Ability to maintain a body temperature in the normal range will improve Outcome: Adequate for Discharge   Problem: Respiratory: Goal: Ability to maintain adequate ventilation will improve Outcome: Adequate for Discharge Goal: Ability to maintain a clear airway will improve Outcome: Adequate  for Discharge   Problem: Education: Goal: Ability to describe self-care measures that may prevent or decrease complications (Diabetes Survival Skills Education) will improve Outcome: Adequate for Discharge   Problem: Fluid Volume: Goal: Ability to maintain a balanced intake and output will improve Outcome: Adequate for Discharge   Problem: Coping: Goal: Ability to adjust to condition or change in health will improve Outcome: Adequate for Discharge   Problem: Health Behavior/Discharge Planning: Goal: Ability to identify and utilize available resources and services will improve Outcome: Adequate for Discharge Goal: Ability to manage health-related needs will improve Outcome: Adequate for Discharge

## 2023-11-08 DIAGNOSIS — J849 Interstitial pulmonary disease, unspecified: Secondary | ICD-10-CM | POA: Diagnosis not present

## 2023-11-08 LAB — GLUCOSE, CAPILLARY
Glucose-Capillary: 198 mg/dL — ABNORMAL HIGH (ref 70–99)
Glucose-Capillary: 259 mg/dL — ABNORMAL HIGH (ref 70–99)
Glucose-Capillary: 223 mg/dL — ABNORMAL HIGH (ref 70–99)
Glucose-Capillary: 253 mg/dL — ABNORMAL HIGH (ref 70–99)

## 2023-11-08 NOTE — Progress Notes (Signed)
 PROGRESS NOTE    [name]  FMW:[id number] DOB: 24-Nov-1945 DOA: 10/26/2023 PCP: Larnell Hamilton, MD   Brief Narrative:  78 y.o. 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  She was last hospitalized from 8/6-9 with an acute exacerbation of pulmonary fibrosis and responded well to IV Solumedrol, empiric antibiotics, nebulizers, supplemental O2. She was discharged on PO Augmentin  + Azithromycin  and a prednisone  taper.  She is currently on 4L home O2.    Presents with confusion and worsening somnolence.  Found to be hypercarbic and transferred to the progressive care unit and placed on BiPAP.  Pulmonary consulted and changed her back to oral prednisone .  At this time patient is medically stable and agreeable for discharge home currently awaiting insurance approval for durable medical equipment to ensure safe disposition home given below.  Assessment & Plan:   Principal Problem:   ILD (interstitial lung disease) (HCC) Active Problems:   Pacemaker- March 2016    Hx of CABG 2005   Paroxysmal atrial fibrillation (HCC)   Class 2 obesity due to excess calories with body mass index (BMI) of 36.0 to 36.9 in adult   Mixed hyperlipidemia   Poorly controlled diabetes mellitus (HCC)   Hypertension   Chronic diastolic CHF (congestive heart failure) (HCC)   Healthcare-associated pneumonia  Hypersomnolence/chronic hypoxemic and hypercapnic respiratory failure -Somnolence resolved after using BiPAP -Presented with hypersomnolence and altered mental status - Likely in setting of hypercapnic respiratory failure, [place] due to elevated at 84 per VBG from 10/27/2023 - pCO2 improved to 75 with BiPAP; continue BiPAP as tolerated - Patient is more alert and awake.  Answering question appropriately. - Pulmonology saw the patient, recommended to send home with  BiPAP - Awaiting BiPAP to be delivered at home by insurance company; will likely have BiPAP delivered in next 2 to 3 days   Acute encephalopathy, resolved Rule out hyperammonemia - Likely in setting of above - TSH 0.126, B12 1384, HIV nonreactive - EEG and showed that the study was suggestive of moderate to severe diffuse encephalopathy with no seizures or epileptiform discharges seen throughout the recording.  -MRI brain without acute abnormality - Ammonia minimally elevated at 40, currently within normal limits, off lactulose    Acute on chronic HFpEF - Patient takes Lasix  40 mg p.o. twice daily at home -Clinically volume overloaded with elevated BNP at intake - She was given  Lasix  40 mg IV every 12 hours and assess diuretic response - Off IV Lasix , continue Lasix  40 mg p.o. once daily; dose of Lasix  was changed due to worsening contraction catabolic alkalosis   Recent pneumonia -Treated with antibiotics and steroids -Procalcitonin less than 0.10 -Currently on cefepime  - IV antibiotics stopped on 10/30/2023   ILD Flare: Patient with recent hospitalization for ILD flare.  -She was treated with course of steroids and antibiotics - Currently on prednisone    Metabolic alkalosis -Resolved after Diamox  500 mg IV x 1 dose - CO2 elevated at 43, likely contraction alkalosis from diuresis as well as compensation from respiratory acidosis - VBG confirms bicarb of 46.7, likely has a compensation for respiratory acidosis - Continue Lasix  40 mg p.o. daily    Hypokalemia - Replete as appropriate Chronic HFpEF: Lasix  restarted as above CAD S/p CABG: No c/o CP Afib: Has pacemaker and Rate controlled with Toprol  XL. C/w Anticoagulation w/ Apixaban  HLD: On Repatha  at home  DMType 2 uncontrolled with hyperglycemia: A1c 7.4 Normocytic anemia- CTM for S/Sx of Bleeding; No overt bleeding noted.   Class II Obesity: Complicates overall prognosis and care. Estimated body mass index is 36.79 kg/m as  calculated from the following:   Height as of this encounter: 5' 3 (1.6 m).   Weight as of this encounter: 94.2 kg. Weight Loss and Dietary Counseling given   DVT prophylaxis:  apixaban  (ELIQUIS ) tablet 5 mg   Code Status:   Code Status: Limited: Do not attempt resuscitation (DNR) -DNR-LIMITED -Do Not Intubate/DNI   Family Communication: At bedside  Status is: Inpatient  Dispo: The patient is from: Home              Anticipated d/c is to: Home              Anticipated d/c date is: Imminent              Patient currently is medically stable for discharge awaiting insurance approval of medical equipment to ensure safe disposition Home  Consultants:  Pulmonology  Procedures:  None  Antimicrobials:  None  Subjective: No acute issues or events overnight  Objective: Vitals:   11/07/23 2128 11/07/23 2326 11/08/23 0051 11/08/23 0626  BP: (!) 155/65  (!) 161/66   Pulse: 64 64 (!) 130   Resp: 19 (!) 27 18   Temp: 97.6 F (36.4 C)   (!) 97.3 F (36.3 C)  TempSrc: Oral     SpO2: 97% 97% 97% (!) 88%  Weight:      Height:        Intake/Output Summary (Last 24 hours) at 11/08/2023 0756 Last data filed at 11/07/2023 2225 Gross per 24 hour  Intake 954 ml  Output --  Net 954 ml   Filed Weights   10/26/23 1135  Weight: 94.2 kg    Examination:  General:  Pleasantly resting in bed, No acute distress. HEENT:  Normocephalic atraumatic.  Sclerae nonicteric, noninjected.  Extraocular movements intact bilaterally. Neck:  Without mass or deformity.  Trachea is midline. Lungs:  Clear to auscultate bilaterally without rhonchi, wheeze, or rales. Heart:  Regular rate and rhythm.  Scant rales Abdomen:  Soft, obese nontender, nondistended.  Without guarding or rebound.  Data Reviewed: I have personally reviewed following labs and imaging studies  CBC: Recent Labs  Lab 11/03/23 0414  WBC 11.2*  HGB 12.3  HCT 39.2  MCV 90.1  PLT 181   Basic Metabolic Panel: Recent Labs   Lab 11/01/23 1057 11/02/23 0623 11/03/23 0414  NA 138 139 135  K 3.3* 3.4* 4.3  CL 89* 86* 91*  CO2 41* 43* 34*  GLUCOSE 217* 171* 202*  BUN 17 19 20   CREATININE 0.94 0.89 0.79  CALCIUM  9.1 8.9 9.0   GFR: Estimated Creatinine Clearance: 64.2 mL/min (by C-G formula based on SCr of 0.79 mg/dL). Liver Function Tests: No results for input(s): AST, ALT, ALKPHOS, BILITOT, PROT, ALBUMIN in the last 168 hours. No results for input(s): LIPASE, AMYLASE in the last 168 hours. No results for input(s): AMMONIA in the last 168 hours. Coagulation Profile: No results for input(s): INR, PROTIME in the last 168 hours. Cardiac Enzymes: No results for input(s): CKTOTAL, CKMB, CKMBINDEX, TROPONINI in the last 168 hours. BNP (last 3 results) No results for input(s): PROBNP in the last 8760 hours. HbA1C: No results for input(s): HGBA1C in the last 72 hours. CBG: Recent Labs  Lab 11/07/23 0740 11/07/23 1154 11/07/23 1318 11/07/23 1659 11/07/23 2123  GLUCAP 262* 226* 270* 217* 138*   Lipid Profile: No results for input(s): CHOL, HDL, LDLCALC, TRIG, CHOLHDL, LDLDIRECT in the last 72 hours. Thyroid  Function Tests: No results for input(s): TSH, T4TOTAL, FREET4, T3FREE, THYROIDAB in the last 72 hours. Anemia Panel: No results for input(s): VITAMINB12, FOLATE, FERRITIN, TIBC, IRON, RETICCTPCT in the last 72 hours. Sepsis Labs: No results for input(s): PROCALCITON, LATICACIDVEN in the last 168 hours.  No results found for this or any previous visit (from the past 240 hours).        Radiology Studies: No results found.      Scheduled Meds:  apixaban   5 mg Oral BID   benzonatate   100 mg Oral BID   docusate sodium   100 mg Oral BID   furosemide   40 mg Oral BID   insulin  aspart  0-20 Units Subcutaneous TID WC   insulin  aspart  0-5 Units Subcutaneous QHS   metoprolol  succinate  50 mg Oral Daily   predniSONE   10  mg Oral Q breakfast   sodium chloride  flush  3 mL Intravenous Q12H   Continuous Infusions:   LOS: 13 days   Time spent:  Elsie JAYSON Montclair, DO [facility]  If 7PM-7AM, please contact night-coverage [email or web address]  11/08/2023, 7:56 AM

## 2023-11-08 NOTE — Progress Notes (Signed)
 Mobility Specialist Progress Note:   11/08/23 0950  Mobility  Activity Ambulated with assistance  Level of Assistance Contact guard assist, steadying assist  Assistive Device Four wheel walker  Distance Ambulated (ft) 100 ft ((50+50))  Activity Response Tolerated well  Mobility Referral Yes  Mobility visit 1 Mobility  Mobility Specialist Start Time (ACUTE ONLY) 0950  Mobility Specialist Stop Time (ACUTE ONLY) 1002  Mobility Specialist Time Calculation (min) (ACUTE ONLY) 12 min   Pt agreeable to mobility session. Required only minG assist to ambulate with rollator. X1 seated rest break taken and up to 3LO2 to maintain SpO2 WFL. Back sitting EOB with all needs met, on 2LO2 with daughter in room.   Therisa Rana Mobility Specialist Please contact via SecureChat or  Rehab office at [phone]

## 2023-11-08 NOTE — Progress Notes (Signed)
   11/08/23 2333  BiPAP/CPAP/SIPAP  $ Non-Invasive Ventilator  Non-Invasive Vent Subsequent  BiPAP/CPAP/SIPAP Pt Type Adult  BiPAP/CPAP/SIPAP SERVO  Mask Type Full face mask  Mask Size Medium  Set Rate 12 breaths/min  Respiratory Rate 22 breaths/min  IPAP 10 cmH20  EPAP 5 cmH2O  PEEP 5 cmH20  FiO2 (%) 30 %  Minute Ventilation 9.4  Leak 20  Peak Inspiratory Pressure (PIP) 10  Tidal Volume (Vt) 474  Patient Home Machine No  Patient Home Mask No  Patient Home Tubing No  Auto Titrate No  BiPAP/CPAP /SiPAP Vitals  Pulse Rate (!) 59  Resp (!) 22  Bilateral Breath Sounds Diminished

## 2023-11-08 NOTE — Plan of Care (Signed)
  Problem: Health Behavior/Discharge Planning: Goal: Ability to manage health-related needs will improve Outcome: Progressing   Problem: Clinical Measurements: Goal: Ability to maintain clinical measurements within normal limits will improve Outcome: Progressing Goal: Will remain free from infection Outcome: Progressing Goal: Diagnostic test results will improve Outcome: Progressing Goal: Respiratory complications will improve Outcome: Progressing Goal: Cardiovascular complication will be avoided Outcome: Progressing   Problem: Activity: Goal: Risk for activity intolerance will decrease Outcome: Progressing   Problem: Nutrition: Goal: Adequate nutrition will be maintained Outcome: Progressing   Problem: Coping: Goal: Level of anxiety will decrease Outcome: Progressing   Problem: Elimination: Goal: Will not experience complications related to bowel motility Outcome: Progressing Goal: Will not experience complications related to urinary retention Outcome: Progressing   Problem: Pain Managment: Goal: General experience of comfort will improve and/or be controlled Outcome: Progressing   Problem: Safety: Goal: Ability to remain free from injury will improve Outcome: Progressing   Problem: Skin Integrity: Goal: Risk for impaired skin integrity will decrease Outcome: Progressing   Problem: Activity: Goal: Ability to tolerate increased activity will improve Outcome: Progressing   Problem: Clinical Measurements: Goal: Ability to maintain a body temperature in the normal range will improve Outcome: Progressing   Problem: Respiratory: Goal: Ability to maintain adequate ventilation will improve Outcome: Progressing Goal: Ability to maintain a clear airway will improve Outcome: Progressing   Problem: Education: Goal: Ability to describe self-care measures that may prevent or decrease complications (Diabetes Survival Skills Education) will improve Outcome:  Progressing Goal: Individualized Educational Video(s) Outcome: Progressing   Problem: Coping: Goal: Ability to adjust to condition or change in health will improve Outcome: Progressing   Problem: Fluid Volume: Goal: Ability to maintain a balanced intake and output will improve Outcome: Progressing   Problem: Health Behavior/Discharge Planning: Goal: Ability to identify and utilize available resources and services will improve Outcome: Progressing Goal: Ability to manage health-related needs will improve Outcome: Progressing   Problem: Metabolic: Goal: Ability to maintain appropriate glucose levels will improve Outcome: Progressing   Problem: Nutritional: Goal: Maintenance of adequate nutrition will improve Outcome: Progressing Goal: Progress toward achieving an optimal weight will improve Outcome: Progressing   Problem: Skin Integrity: Goal: Risk for impaired skin integrity will decrease Outcome: Progressing   Problem: Tissue Perfusion: Goal: Adequacy of tissue perfusion will improve Outcome: Progressing

## 2023-11-09 DIAGNOSIS — J849 Interstitial pulmonary disease, unspecified: Secondary | ICD-10-CM | POA: Diagnosis not present

## 2023-11-09 LAB — GLUCOSE, CAPILLARY
Glucose-Capillary: 212 mg/dL — ABNORMAL HIGH (ref 70–99)
Glucose-Capillary: 232 mg/dL — ABNORMAL HIGH (ref 70–99)
Glucose-Capillary: 222 mg/dL — ABNORMAL HIGH (ref 70–99)
Glucose-Capillary: 161 mg/dL — ABNORMAL HIGH (ref 70–99)

## 2023-11-09 NOTE — Progress Notes (Signed)
 PROGRESS NOTE    [name]  FMW:[id number] DOB: [birth year] DOA: 10/26/2023 PCP: Larnell Hamilton, MD   Brief Narrative:  This 78 yrs old female with medical history significant for chronic hypoxic respiratory failure on 2L home O2 associated with ILD, chronic HFpEF, CAD s/p CABG, PAF, pacemaker, and DM who presented with SOB.  She was last hospitalized from 8/6-8/9 with an acute exacerbation of pulmonary fibrosis and responded well to IV Solumedrol, empiric antibiotics, nebulizers, supplemental O2. She was discharged on PO Augmentin  + Azithromycin  and a prednisone  taper.  She is currently on 4L home O2.    Patient presents with confusion and worsening somnolence.  Found to be hypercarbic and transferred to the progressive care unit and placed on BiPAP.  Pulmonary consulted and changed her back to oral prednisone .  At this time patient is medically stable and agreeable for discharge home currently awaiting insurance approval for durable medical equipment to ensure safe disposition home.   Assessment & Plan:   Principal Problem:   ILD (interstitial lung disease) (HCC) Active Problems:   Pacemaker- March 2016    Hx of CABG 2005   Paroxysmal atrial fibrillation (HCC)   Class 2 obesity due to excess calories with body mass index (BMI) of 36.0 to 36.9 in adult   Mixed hyperlipidemia   Poorly controlled diabetes mellitus (HCC)   Hypertension   Chronic diastolic CHF (congestive heart failure) (HCC)   Healthcare-associated pneumonia   Hypersomnolence: Chronic hypoxemic and hypercapnic respiratory failure: -Somnolence resolved after using BiPAP.  No -Presented with hypersomnolence and altered mental status -Likely in setting of hypercapnic respiratory failure, PCO2 patient  elevated at 84 per VBG from 10/27/2023 - pCO2 improved to 75 with BiPAP; continue BiPAP as tolerated - Patient is more alert and awake.  Answering question appropriately. - Pulmonology evaluated the patient,   recommended to send home with BiPAP. - Awaiting BiPAP to be delivered at home by insurance company; will likely have BiPAP delivered in next 2 to 3 days   Acute encephalopathy > resolved Hyperammonemia  ruled out. - Likely in setting of above. - TSH 0.126, B12 1384, HIV nonreactive. - EEG showed that the study was suggestive of moderate to severe diffuse encephalopathy with no seizures or epileptiform discharges seen throughout the recording.  -MRI brain without acute abnormality - Ammonia minimally elevated at 40, currently within normal limits, off lactulose .   Acute on chronic HFpEF: - Patient takes Lasix  40 mg p.o. twice daily at home - Clinically volume overloaded with elevated BNP at intake - She was given  Lasix  40 mg IV every 12 hours and assess diuretic response. - Off IV Lasix , continue Lasix  40 mg p.o. once daily; dose of Lasix  was changed due to worsening contraction catabolic alkalosis.   Recent pneumonia: -Treated with antibiotics and steroids. -Procalcitonin less than 0.10 -IV antibiotics stopped on 10/30/2023   ILD Flare:  Patient with recent hospitalization for ILD flare.  -She was treated with course of steroids and antibiotics. -Currently on prednisone  .   Metabolic alkalosis -Resolved after Diamox  500 mg IV x 1 dose - CO2 elevated at 43, likely contraction alkalosis from diuresis as well as compensation from respiratory acidosis - VBG confirms bicarb of 46.7, likely has a compensation for respiratory acidosis. - Continue Lasix  40 mg p.o. daily .   Hypokalemia - Replete as appropriate.  Chronic HFpEF: Lasix  restarted 40 daily.  CAD S/p CABG: No c/o CP.  Afib: Has pacemaker and Rate controlled with Toprol  XL. C/w  Anticoagulation w/ Apixaban .  HLD: On Repatha  at home.  DMType 2 uncontrolled with hyperglycemia: A1c 7.4  Normocytic anemia- CTM for S/Sx of Bleeding; No overt bleeding noted.    Class II Obesity: Complicates overall prognosis and care.  Estimated body mass index is 36.79 kg/m as calculated from the following:   Height as of this encounter: 5' 3 (1.6 m).   Weight as of this encounter: 94.2 kg. Weight Loss and Dietary Counseling given   DVT prophylaxis: Eliquis  Code Status: Full code Family Communication: Husband at bedside Disposition Plan:   Patient currently is medically stable for discharge,  awaiting insurance approval of medical equipment to ensure safe disposition Home   Consultants:  Pulmonology  Procedures: None  Antimicrobials:  Anti-infectives (From admission, onward)    Start     Dose/Rate Route Frequency Ordered Stop   10/29/23 2200  cefTRIAXone  (ROCEPHIN ) 2 g in sodium chloride  0.9 % 100 mL IVPB  Status:  Discontinued        2 g 200 mL/hr over 30 Minutes Intravenous Every 24 hours 10/29/23 0926 10/30/23 1153   10/28/23 1245  ceFEPIme  (MAXIPIME ) 2 g in sodium chloride  0.9 % 100 mL IVPB  Status:  Discontinued        2 g 200 mL/hr over 30 Minutes Intravenous Every 12 hours 10/28/23 1150 10/29/23 0926   10/27/23 1000  vancomycin  (VANCOREADY) IVPB 750 mg/150 mL  Status:  Discontinued        750 mg 150 mL/hr over 60 Minutes Intravenous Every 24 hours 10/26/23 1943 10/27/23 1324   10/27/23 0200  ceFEPIme  (MAXIPIME ) 2 g in sodium chloride  0.9 % 100 mL IVPB  Status:  Discontinued        2 g 200 mL/hr over 30 Minutes Intravenous Every 12 hours 10/26/23 1943 10/27/23 1810   10/26/23 1430  vancomycin  (VANCOCIN ) IVPB 1000 mg/200 mL premix  Status:  Discontinued        1,000 mg 200 mL/hr over 60 Minutes Intravenous  Once 10/26/23 1421 10/26/23 1424   10/26/23 1430  ceFEPIme  (MAXIPIME ) 2 g in sodium chloride  0.9 % 100 mL IVPB        2 g 200 mL/hr over 30 Minutes Intravenous  Once 10/26/23 1421 10/26/23 1504   10/26/23 1430  vancomycin  (VANCOREADY) IVPB 1500 mg/300 mL        1,500 mg 150 mL/hr over 120 Minutes Intravenous  Once 10/26/23 1424 10/26/23 1750         Subjective: Patient was seen and  examined at bedside.  Overnight events noted. Patient is overall doing better,  She wants to go home,  awaiting arrangement of BiPAP before discharge.  Objective: Vitals:   11/08/23 2333 11/09/23 0345 11/09/23 0810 11/09/23 1115  BP:  (!) 153/60 (!) 138/53   Pulse: (!) 59 65 64   Resp: (!) 22 20 18    Temp:  (!) 97.5 F (36.4 C) 97.6 F (36.4 C)   TempSrc:  Oral Oral   SpO2:  95% 98% 94%  Weight:      Height:        Intake/Output Summary (Last 24 hours) at 11/09/2023 1323 Last data filed at 11/08/2023 2200 Gross per 24 hour  Intake 250 ml  Output --  Net 250 ml   Filed Weights   10/26/23 1135  Weight: 94.2 kg    Examination:  General exam: Appears calm and comfortable, not in any acute distress. Deconditioned Respiratory system: CTA Bilaterally. Respiratory effort normal. RR 16 Cardiovascular system:  S1 & S2 heard, RRR. No JVD, murmurs, rubs, gallops or clicks.  Gastrointestinal system: Abdomen is non distended, soft and non tender.  Normal bowel sounds heard. Central nervous system: Alert and oriented X 3. No focal neurological deficits. Extremities: No edema, no cyanosis, no clubbing. Skin: No rashes, lesions or ulcers Psychiatry: Judgement and insight appear normal. Mood & affect appropriate.     Data Reviewed: I have personally reviewed following labs and imaging studies  CBC: Recent Labs  Lab 11/03/23 0414  WBC 11.2*  HGB 12.3  HCT 39.2  MCV 90.1  PLT 181   Basic Metabolic Panel: Recent Labs  Lab 11/03/23 0414  NA 135  K 4.3  CL 91*  CO2 34*  GLUCOSE 202*  BUN 20  CREATININE 0.79  CALCIUM  9.0   GFR: Estimated Creatinine Clearance: 64.2 mL/min (by C-G formula based on SCr of 0.79 mg/dL). Liver Function Tests: No results for input(s): AST, ALT, ALKPHOS, BILITOT, PROT, ALBUMIN in the last 168 hours. No results for input(s): LIPASE, AMYLASE in the last 168 hours. No results for input(s): AMMONIA in the last 168  hours. Coagulation Profile: No results for input(s): INR, PROTIME in the last 168 hours. Cardiac Enzymes: No results for input(s): CKTOTAL, CKMB, CKMBINDEX, TROPONINI in the last 168 hours. BNP (last 3 results) No results for input(s): PROBNP in the last 8760 hours. HbA1C: No results for input(s): HGBA1C in the last 72 hours. CBG: Recent Labs  Lab 11/08/23 1201 11/08/23 1553 11/08/23 2123 11/09/23 0808 11/09/23 1132  GLUCAP 259* 223* 253* 212* 232*   Lipid Profile: No results for input(s): CHOL, HDL, LDLCALC, TRIG, CHOLHDL, LDLDIRECT in the last 72 hours. Thyroid  Function Tests: No results for input(s): TSH, T4TOTAL, FREET4, T3FREE, THYROIDAB in the last 72 hours. Anemia Panel: No results for input(s): VITAMINB12, FOLATE, FERRITIN, TIBC, IRON, RETICCTPCT in the last 72 hours. Sepsis Labs: No results for input(s): PROCALCITON, LATICACIDVEN in the last 168 hours.  No results found for this or any previous visit (from the past 240 hours).  Radiology Studies: No results found.  Scheduled Meds:  apixaban   5 mg Oral BID   benzonatate   100 mg Oral BID   docusate sodium   100 mg Oral BID   furosemide   40 mg Oral BID   insulin  aspart  0-20 Units Subcutaneous TID WC   insulin  aspart  0-5 Units Subcutaneous QHS   metoprolol  succinate  50 mg Oral Daily   predniSONE   10 mg Oral Q breakfast   sodium chloride  flush  3 mL Intravenous Q12H   Continuous Infusions:   LOS: 14 days    Time spent: 50 mins    Darcel Dawley, MD [facility]   If 7PM-7AM, please contact night-coverage

## 2023-11-09 NOTE — Progress Notes (Signed)
 Pt is alert and fully oriented x 4, afebrile, stable hemodynamically, ventricular paced on the monitor, on 2 LPMof O2 NCL on day time, on BiPAP at night time. Pt is well tolerated. No major complaints overnight. No acute respiratory distress noted. She is able to rest well. Plan of care reviewed. Pt has been progressing. We will continue to monitor.   Problem: Clinical Measurements: Goal: Ability to maintain clinical measurements within normal limits will improve Outcome: Progressing Goal: Will remain free from infection Outcome: Progressing Goal: Diagnostic test results will improve Outcome: Progressing Goal: Respiratory complications will improve Outcome: Progressing Goal: Cardiovascular complication will be avoided Outcome: Progressing   Problem: Elimination: Goal: Will not experience complications related to bowel motility Outcome: Progressing Goal: Will not experience complications related to urinary retention Outcome: Progressing   Problem: Pain Managment: Goal: General experience of comfort will improve and/or be controlled Outcome: Progressing   Problem: Skin Integrity: Goal: Risk for impaired skin integrity will decrease Outcome: Progressing   Problem: Respiratory: Goal: Ability to maintain adequate ventilation will improve Outcome: Progressing Goal: Ability to maintain a clear airway will improve Outcome: Progressing   Wendi Dash, RN

## 2023-11-09 NOTE — Care Management Important Message (Signed)
 Important Message  Patient Details  Name: DEEPTI GUNAWAN MRN: [id number] Date of Birth: [birth year]   Important Message Given:  Yes - Medicare IM     Vonzell Arrie Sharps 11/09/2023, 10:38 AM

## 2023-11-09 NOTE — Progress Notes (Signed)
 Physical Therapy Treatment Patient Details Name: [name] MRN: [id number] DOB: [birth year] Today's Date: 11/09/2023   History of Present Illness 78 yo female adm 10/26/23 with AMS, weakness, hypercarbic and acute encephalopathy. PMHx: Admission 8/6-8/9 with ILD flare. Chronic resp failure on 2L, ILD, CHF, CAD s/p CABG, PAF, SSS s/p PPM, T2DM    PT Comments  Pt pleasant but frustrated by equipment needs and wanting to go home. Pt slowly progressing with increased gait distance and continues to require 3L with activity and able to perform increased endurance exercises. Encouraged continued ambulation and will continue to follow.    If plan is discharge home, recommend the following: Assist for transportation;A little help with walking and/or transfers;Assistance with cooking/housework;A little help with bathing/dressing/bathroom   Can travel by private vehicle     Yes  Equipment Recommendations  Rollator (4 wheels) (bari rollator)    Recommendations for Other Services       Precautions / Restrictions Precautions Precautions: Fall;Other (comment) Recall of Precautions/Restrictions: Intact Precaution/Restrictions Comments: monitor O2 (2-3 L O2 at baseline)     Mobility  Bed Mobility               General bed mobility comments: eOB on arrival and end of session    Transfers Overall transfer level: Modified independent                 General transfer comment: pt able to stand from EOB x 10 repeated trials in 1 min. stood from EOB initially and from rollator x 2 trials with good use and safety with brakes    Ambulation/Gait Ambulation/Gait assistance: Contact guard assist Gait Distance (Feet): 80 Feet Assistive device: Rollator (4 wheels) Gait Pattern/deviations: Step-through pattern, Decreased stride length, Trunk flexed   Gait velocity interpretation: <1.8 ft/sec, indicate of risk for recurrent falls   General Gait Details: cues for posture and  progressive gait distance. Pt walked 60', 30, 80' respectively with reliance on rollator and 3L O2 with desaturation to 89% with shorter distance and 84% with 80'   Stairs             Wheelchair Mobility     Tilt Bed    Modified Rankin (Stroke Patients Only)       Balance Overall balance assessment: Needs assistance Sitting-balance support: No upper extremity supported, Feet supported Sitting balance-Leahy Scale: Good Sitting balance - Comments: EOB without support   Standing balance support: Bilateral upper extremity supported, During functional activity, Reliant on assistive device for balance, No upper extremity supported Standing balance-Leahy Scale: Fair Standing balance comment: static standing without support, rollator for gait                            Communication Communication Communication: No apparent difficulties  Cognition Arousal: Alert Behavior During Therapy: WFL for tasks assessed/performed   PT - Cognitive impairments: No apparent impairments                         Following commands: Intact Following commands impaired: Follows multi-step commands with increased time    Cueing Cueing Techniques: Verbal cues  Exercises      General Comments        Pertinent Vitals/Pain Pain Assessment Pain Assessment: No/denies pain    Home Living  Prior Function            PT Goals (current goals can now be found in the care plan section) Progress towards PT goals: Progressing toward goals    Frequency    Min 2X/week      PT Plan      Co-evaluation              AM-PAC PT 6 Clicks Mobility   Outcome Measure  Help needed turning from your back to your side while in a flat bed without using bedrails?: None Help needed moving from lying on your back to sitting on the side of a flat bed without using bedrails?: None Help needed moving to and from a bed to a chair  (including a wheelchair)?: None Help needed standing up from a chair using your arms (e.g., wheelchair or bedside chair)?: None Help needed to walk in hospital room?: A Little Help needed climbing 3-5 steps with a railing? : A Lot 6 Click Score: 21    End of Session Equipment Utilized During Treatment: Oxygen  Activity Tolerance: Patient tolerated treatment well Patient left: with call bell/phone within reach;in bed;with family/visitor present Nurse Communication: Mobility status PT Visit Diagnosis: Other abnormalities of gait and mobility (R26.89);Muscle weakness (generalized) (M62.81);Difficulty in walking, not elsewhere classified (R26.2)     Time: 1025-1050 PT Time Calculation (min) (ACUTE ONLY): 25 min  Charges:    $Gait Training: 8-22 mins $Therapeutic Activity: 8-22 mins PT General Charges $$ ACUTE PT VISIT: 1 Visit                     Lenoard SQUIBB, PT Acute Rehabilitation Services Office: [phone]    Lenoard NOVAK Dempsy Damiano 11/09/2023, 11:19 AM

## 2023-11-09 NOTE — Plan of Care (Signed)
  Problem: Health Behavior/Discharge Planning: Goal: Ability to manage health-related needs will improve Outcome: Progressing   Problem: Clinical Measurements: Goal: Ability to maintain clinical measurements within normal limits will improve Outcome: Progressing Goal: Will remain free from infection Outcome: Progressing Goal: Diagnostic test results will improve Outcome: Progressing Goal: Respiratory complications will improve Outcome: Progressing Goal: Cardiovascular complication will be avoided Outcome: Progressing   Problem: Activity: Goal: Risk for activity intolerance will decrease Outcome: Progressing   Problem: Nutrition: Goal: Adequate nutrition will be maintained Outcome: Progressing   Problem: Coping: Goal: Level of anxiety will decrease Outcome: Progressing   Problem: Elimination: Goal: Will not experience complications related to bowel motility Outcome: Progressing Goal: Will not experience complications related to urinary retention Outcome: Progressing   Problem: Pain Managment: Goal: General experience of comfort will improve and/or be controlled Outcome: Progressing   Problem: Safety: Goal: Ability to remain free from injury will improve Outcome: Progressing   Problem: Skin Integrity: Goal: Risk for impaired skin integrity will decrease Outcome: Progressing   Problem: Activity: Goal: Ability to tolerate increased activity will improve Outcome: Progressing   Problem: Clinical Measurements: Goal: Ability to maintain a body temperature in the normal range will improve Outcome: Progressing   Problem: Respiratory: Goal: Ability to maintain adequate ventilation will improve Outcome: Progressing Goal: Ability to maintain a clear airway will improve Outcome: Progressing   Problem: Education: Goal: Ability to describe self-care measures that may prevent or decrease complications (Diabetes Survival Skills Education) will improve Outcome:  Progressing Goal: Individualized Educational Video(s) Outcome: Progressing   Problem: Coping: Goal: Ability to adjust to condition or change in health will improve Outcome: Progressing   Problem: Fluid Volume: Goal: Ability to maintain a balanced intake and output will improve Outcome: Progressing   Problem: Health Behavior/Discharge Planning: Goal: Ability to identify and utilize available resources and services will improve Outcome: Progressing Goal: Ability to manage health-related needs will improve Outcome: Progressing   Problem: Metabolic: Goal: Ability to maintain appropriate glucose levels will improve Outcome: Progressing   Problem: Nutritional: Goal: Maintenance of adequate nutrition will improve Outcome: Progressing Goal: Progress toward achieving an optimal weight will improve Outcome: Progressing   Problem: Skin Integrity: Goal: Risk for impaired skin integrity will decrease Outcome: Progressing   Problem: Tissue Perfusion: Goal: Adequacy of tissue perfusion will improve Outcome: Progressing

## 2023-11-09 NOTE — Progress Notes (Signed)
 Mobility Specialist Progress Note;   11/09/23 1440  Mobility  Activity Refused and notified nurse if applicable  Mobility Specialist Start Time (ACUTE ONLY) 1440   Pts daughter refusing mobility on pts behalf d/t wanting pt to rest after stressful day. Pt left comfortably in bed with all needs met.   Lauraine Erm Mobility Specialist Please contact via SecureChat or [employer] [phone]

## 2023-11-09 NOTE — TOC Progression Note (Signed)
 Transition of Care [facility]) - Progression Note    Patient Details  Name: [name] MRN: [id number] Date of Birth: [birth year]  Transition of Care [facility]) CM/SW Contact  Graves-Bigelow, Erminio Deems, RN Phone Number: 11/09/2023, 10:36 AM  Clinical Narrative: Case Manager called [facility] and states they are still awaiting insurance determination. Case Manager then called [facility] to see if the insurance has approved- spoke with Jessica and she states the manager reviewed and [employer] has denied the patient for NIV. Per Manager Ellouise, the claim for NIV was denied November 06, 2023. No denial letter has been received and no one alerted the hospital or the patient of the denial. Patient still remains hospitalized and a STAT order has been submitted for BIPAP for home. Awaiting insurance authorization for this patient.     Expected Discharge Plan: Home w Home Health Services Barriers to Discharge: No Barriers Identified  Expected Discharge Plan and Services In-house Referral: Clinical Social Work Discharge Planning Services: CM Consult Post Acute Care Choice: Home Health, Durable Medical Equipment Living arrangements for the past 2 months: Single Family Home                 DME Arranged: Hospital bed, NIV DME Agency: [facility] Date DME Agency Contacted: 10/29/23 Time DME Agency Contacted: 1027 Representative spoke with at DME Agency: [facility] HH Arranged: RN, Disease Management, PT, Nurse's Aide HH Agency: [facility] Date HH Agency Contacted: 10/29/23 Time HH Agency Contacted: 1028 Representative spoke with at [facility] Agency: [facility]   Social Drivers of Health (SDOH) Interventions SDOH Screenings   Food Insecurity: No Food Insecurity (10/26/2023)  Housing: Low Risk  (10/26/2023)  Transportation Needs: No Transportation Needs (10/26/2023)  Utilities: Not At Risk (10/26/2023)  Financial Resource Strain: Low Risk  (12/09/2018)  Social Connections: Unknown (10/26/2023)  Tobacco Use: Low Risk   (10/26/2023)    Readmission Risk Interventions    10/22/2023    4:58 PM  Readmission Risk Prevention Plan  Medication Screening Complete  Transportation Screening Complete

## 2023-11-10 DIAGNOSIS — J849 Interstitial pulmonary disease, unspecified: Secondary | ICD-10-CM | POA: Diagnosis not present

## 2023-11-10 LAB — CBC
HCT: 35.9 % — ABNORMAL LOW (ref 36.0–46.0)
Hemoglobin: 11.1 g/dL — ABNORMAL LOW (ref 12.0–15.0)
MCH: 28 pg (ref 26.0–34.0)
MCHC: 30.9 g/dL (ref 30.0–36.0)
MCV: 90.4 fL (ref 80.0–100.0)
Platelets: 206 K/uL (ref 150–400)
RBC: 3.97 MIL/uL (ref 3.87–5.11)
RDW: 16.8 % — ABNORMAL HIGH (ref 11.5–15.5)
WBC: 9.8 K/uL (ref 4.0–10.5)
nRBC: 0 % (ref 0.0–0.2)

## 2023-11-10 LAB — GLUCOSE, CAPILLARY
Glucose-Capillary: 267 mg/dL — ABNORMAL HIGH (ref 70–99)
Glucose-Capillary: 213 mg/dL — ABNORMAL HIGH (ref 70–99)
Glucose-Capillary: 131 mg/dL — ABNORMAL HIGH (ref 70–99)
Glucose-Capillary: 293 mg/dL — ABNORMAL HIGH (ref 70–99)

## 2023-11-10 LAB — BASIC METABOLIC PANEL WITH GFR
Anion gap: 7 (ref 5–15)
BUN: 28 mg/dL — ABNORMAL HIGH (ref 8–23)
CO2: 39 mmol/L — ABNORMAL HIGH (ref 22–32)
Calcium: 9.3 mg/dL (ref 8.9–10.3)
Chloride: 93 mmol/L — ABNORMAL LOW (ref 98–111)
Creatinine, Ser: 0.89 mg/dL (ref 0.44–1.00)
GFR, Estimated: >60 mL/min (ref >60)
Glucose, Bld: 144 mg/dL — ABNORMAL HIGH (ref 70–99)
Potassium: 4.1 mmol/L (ref 3.5–5.1)
Sodium: 139 mmol/L (ref 135–145)

## 2023-11-10 LAB — MAGNESIUM: Magnesium: 1.7 mg/dL (ref 1.7–2.4)

## 2023-11-10 LAB — PHOSPHORUS: Phosphorus: 4 mg/dL (ref 2.5–4.6)

## 2023-11-10 MED ORDER — FUROSEMIDE 10 MG/ML IJ SOLN
40.0000 mg | Freq: Once | INTRAMUSCULAR | Status: AC
  Administered 2023-11-10: 40 mg via INTRAVENOUS
  Filled 2023-11-10: qty 4

## 2023-11-10 NOTE — Progress Notes (Signed)
 Mobility Specialist Progress Note;   11/10/23 1410  Mobility  Activity Refused and notified nurse if applicable  Mobility Specialist Start Time (ACUTE ONLY) 1410  Mobility Specialist Stop Time (ACUTE ONLY) 1415  Mobility Specialist Time Calculation (min) (ACUTE ONLY) 5 min   Pt refusing mobility at this time d/t being on lasix  and nervous about ambulating on it. Educated pt the ability to still be able to mobilize although on lasix , and encouraged even OOB to chair. Pt still refusing and left with all needs met.   Lauraine Erm Mobility Specialist Please contact via SecureChat or [employer] [phone]

## 2023-11-10 NOTE — Plan of Care (Signed)
  Problem: Health Behavior/Discharge Planning: Goal: Ability to manage health-related needs will improve Outcome: Progressing   Problem: Clinical Measurements: Goal: Ability to maintain clinical measurements within normal limits will improve Outcome: Progressing Goal: Will remain free from infection Outcome: Progressing Goal: Diagnostic test results will improve Outcome: Progressing Goal: Respiratory complications will improve Outcome: Progressing Goal: Cardiovascular complication will be avoided Outcome: Progressing   Problem: Activity: Goal: Risk for activity intolerance will decrease Outcome: Progressing   Problem: Nutrition: Goal: Adequate nutrition will be maintained Outcome: Progressing   Problem: Coping: Goal: Level of anxiety will decrease Outcome: Progressing   Problem: Elimination: Goal: Will not experience complications related to bowel motility Outcome: Progressing Goal: Will not experience complications related to urinary retention Outcome: Progressing   Problem: Pain Managment: Goal: General experience of comfort will improve and/or be controlled Outcome: Progressing   Problem: Safety: Goal: Ability to remain free from injury will improve Outcome: Progressing   Problem: Skin Integrity: Goal: Risk for impaired skin integrity will decrease Outcome: Progressing   Problem: Activity: Goal: Ability to tolerate increased activity will improve Outcome: Progressing   Problem: Clinical Measurements: Goal: Ability to maintain a body temperature in the normal range will improve Outcome: Progressing   Problem: Respiratory: Goal: Ability to maintain adequate ventilation will improve Outcome: Progressing Goal: Ability to maintain a clear airway will improve Outcome: Progressing   Problem: Education: Goal: Ability to describe self-care measures that may prevent or decrease complications (Diabetes Survival Skills Education) will improve Outcome:  Progressing Goal: Individualized Educational Video(s) Outcome: Progressing   Problem: Coping: Goal: Ability to adjust to condition or change in health will improve Outcome: Progressing   Problem: Fluid Volume: Goal: Ability to maintain a balanced intake and output will improve Outcome: Progressing   Problem: Health Behavior/Discharge Planning: Goal: Ability to identify and utilize available resources and services will improve Outcome: Progressing Goal: Ability to manage health-related needs will improve Outcome: Progressing   Problem: Metabolic: Goal: Ability to maintain appropriate glucose levels will improve Outcome: Progressing   Problem: Nutritional: Goal: Maintenance of adequate nutrition will improve Outcome: Progressing Goal: Progress toward achieving an optimal weight will improve Outcome: Progressing   Problem: Skin Integrity: Goal: Risk for impaired skin integrity will decrease Outcome: Progressing   Problem: Tissue Perfusion: Goal: Adequacy of tissue perfusion will improve Outcome: Progressing

## 2023-11-10 NOTE — Plan of Care (Signed)
  Problem: Health Behavior/Discharge Planning: Goal: Ability to manage health-related needs will improve Outcome: Progressing   Problem: Clinical Measurements: Goal: Respiratory complications will improve Outcome: Progressing Goal: Cardiovascular complication will be avoided Outcome: Progressing   Problem: Activity: Goal: Risk for activity intolerance will decrease Outcome: Progressing   Problem: Nutrition: Goal: Adequate nutrition will be maintained Outcome: Progressing   Problem: Safety: Goal: Ability to remain free from injury will improve Outcome: Progressing

## 2023-11-10 NOTE — Progress Notes (Signed)
 PROGRESS NOTE    [name]  FMW:[id number] DOB: 01/19/1946 DOA: 10/26/2023 PCP: Larnell Hamilton, MD   Brief Narrative:  This 78 yrs old female with medical history significant for chronic hypoxic respiratory failure on 2L home O2 associated with ILD, chronic HFpEF, CAD s/p CABG, PAF, pacemaker, and DM who presented with SOB.  She was last hospitalized from 8/6-8/9 with an acute exacerbation of pulmonary fibrosis and responded well to IV Solumedrol, empiric antibiotics, nebulizers, supplemental O2. She was discharged on PO Augmentin  + Azithromycin  and a prednisone  taper.  She is currently on 4L home O2.    Patient presents with confusion and worsening somnolence.  Found to be hypercarbic and transferred to the progressive care unit and placed on BiPAP.  Pulmonary consulted and changed her back to oral prednisone .  At this time patient is medically stable and agreeable for discharge home currently awaiting insurance approval for durable medical equipment to ensure safe disposition home.   Assessment & Plan:   Principal Problem:   ILD (interstitial lung disease) (HCC) Active Problems:   Pacemaker- March 2016    Hx of CABG 2005   Paroxysmal atrial fibrillation (HCC)   Class 2 obesity due to excess calories with body mass index (BMI) of 36.0 to 36.9 in adult   Mixed hyperlipidemia   Poorly controlled diabetes mellitus (HCC)   Hypertension   Chronic diastolic CHF (congestive heart failure) (HCC)   Healthcare-associated pneumonia   Hypersomnolence: Chronic hypoxemic and hypercapnic respiratory failure: -Somnolence resolved after using BiPAP.   -Presented with hypersomnolence and altered mental status -Likely in setting of hypercapnic respiratory failure, PCO2  elevated at 84 per VBG from 10/27/2023 - pCO2 improved to 75 with BiPAP; continue BiPAP as tolerated - Patient is more alert and awake.  Answering question appropriately. - Pulmonology evaluated the patient,  recommended to  send home with BiPAP. - Awaiting BiPAP to be delivered at home by insurance company; will likely have BiPAP delivered in next 2 to 3 days.   Acute encephalopathy > resolved. Hyperammonemia  ruled out. - Likely in setting of above. - TSH 0.126, B12 1384, HIV nonreactive. - EEG showed that the study was suggestive of moderate to severe diffuse encephalopathy with no seizures or epileptiform discharges seen throughout the recording.  -MRI brain without acute abnormality - Ammonia minimally elevated at 40, currently within normal limits, off lactulose .   Acute on chronic HFpEF: - Patient takes Lasix  40 mg p.o. twice daily at home. - Clinically volume overloaded with elevated BNP at intake - She was given  Lasix  40 mg IV every 12 hours and assess diuretic response. - IV Lasix  was discontinued and changed to PO Lasix  40 every 12 hours.  -Lasix  40 mg IV once given for worsening edema.  Recent pneumonia: -Treated with antibiotics and steroids. -Procalcitonin less than 0.10 -IV antibiotics stopped on 10/30/2023   ILD Flare:  Patient with recent hospitalization for ILD flare.  -She was treated with course of steroids and antibiotics. -Currently on prednisone  .   Metabolic alkalosis -Resolved after Diamox  500 mg IV x 1 dose - CO2 elevated at 43, likely contraction alkalosis from diuresis as well as compensation from respiratory acidosis - VBG confirms bicarb of 46.7, likely has a compensation for respiratory acidosis. - Continue Lasix  40 mg p.o. daily .   Hypokalemia - Replete as appropriate.  Chronic HFpEF: Lasix  restarted 40 daily.  CAD S/p CABG: No c/o CP.  Afib: Has pacemaker and Rate controlled with Toprol  XL. C/w  Anticoagulation w/ Apixaban .  HLD: On Repatha  at home.  DMType 2 uncontrolled with hyperglycemia: A1c 7.4  Normocytic anemia- CTM for S/Sx of Bleeding; No overt bleeding noted.    Class II Obesity: Complicates overall prognosis and care. Estimated body mass index is  36.79 kg/m as calculated from the following:   Height as of this encounter: 5' 3 (1.6 m).   Weight as of this encounter: 94.2 kg. Weight Loss and Dietary Counseling given   DVT prophylaxis: Eliquis  Code Status: Full code Family Communication: Husband at bedside Disposition Plan:   Patient currently is medically stable for discharge,  awaiting insurance approval of medical equipment to ensure safe disposition Home   Consultants:  Pulmonology  Procedures: None  Antimicrobials:  Anti-infectives (From admission, onward)    Start     Dose/Rate Route Frequency Ordered Stop   10/29/23 2200  cefTRIAXone  (ROCEPHIN ) 2 g in sodium chloride  0.9 % 100 mL IVPB  Status:  Discontinued        2 g 200 mL/hr over 30 Minutes Intravenous Every 24 hours 10/29/23 0926 10/30/23 1153   10/28/23 1245  ceFEPIme  (MAXIPIME ) 2 g in sodium chloride  0.9 % 100 mL IVPB  Status:  Discontinued        2 g 200 mL/hr over 30 Minutes Intravenous Every 12 hours 10/28/23 1150 10/29/23 0926   10/27/23 1000  vancomycin  (VANCOREADY) IVPB 750 mg/150 mL  Status:  Discontinued        750 mg 150 mL/hr over 60 Minutes Intravenous Every 24 hours 10/26/23 1943 10/27/23 1324   10/27/23 0200  ceFEPIme  (MAXIPIME ) 2 g in sodium chloride  0.9 % 100 mL IVPB  Status:  Discontinued        2 g 200 mL/hr over 30 Minutes Intravenous Every 12 hours 10/26/23 1943 10/27/23 1810   10/26/23 1430  vancomycin  (VANCOCIN ) IVPB 1000 mg/200 mL premix  Status:  Discontinued        1,000 mg 200 mL/hr over 60 Minutes Intravenous  Once 10/26/23 1421 10/26/23 1424   10/26/23 1430  ceFEPIme  (MAXIPIME ) 2 g in sodium chloride  0.9 % 100 mL IVPB        2 g 200 mL/hr over 30 Minutes Intravenous  Once 10/26/23 1421 10/26/23 1504   10/26/23 1430  vancomycin  (VANCOREADY) IVPB 1500 mg/300 mL        1,500 mg 150 mL/hr over 120 Minutes Intravenous  Once 10/26/23 1424 10/26/23 1750       Subjective: Patient was seen and examined at bedside.  Overnight events  noted. Patient is overall doing better, she reports more shortness of breath. She has developed worsening lower extremity edema.  Objective: Vitals:   11/10/23 0015 11/10/23 0419 11/10/23 0803 11/10/23 1214  BP: (!) 148/65 (!) 163/72 (!) 127/52 (!) 150/65  Pulse: (!) 59 (!) 59 (!) 59   Resp: 20 20 (!) 25 (!) 22  Temp: 97.9 F (36.6 C) 97.6 F (36.4 C) (!) 97.4 F (36.3 C) 98 F (36.7 C)  TempSrc: Oral Axillary Oral Oral  SpO2: 93% 96% 97% 96%  Weight:      Height:        Intake/Output Summary (Last 24 hours) at 11/10/2023 1320 Last data filed at 11/10/2023 1214 Gross per 24 hour  Intake --  Output 2100 ml  Net -2100 ml   Filed Weights   10/26/23 1135  Weight: 94.2 kg    Examination:  General exam: Appears calm and comfortable, not in any acute distress. Deconditioned. Respiratory system:  Bibasilar crackles. Respiratory effort normal. RR 16 Cardiovascular system: S1 & S2 heard, RRR. No JVD, murmurs, rubs, gallops or clicks.  Gastrointestinal system: Abdomen is non distended, soft and non tender.  Normal bowel sounds heard. Central nervous system: Alert and oriented X 3. No focal neurological deficits. Extremities: Edema++, no cyanosis, no clubbing. Skin: No rashes, lesions or ulcers Psychiatry: Judgement and insight appear normal. Mood & affect appropriate.     Data Reviewed: I have personally reviewed following labs and imaging studies  CBC: Recent Labs  Lab 11/10/23 0602  WBC 9.8  HGB 11.1*  HCT 35.9*  MCV 90.4  PLT 206   Basic Metabolic Panel: Recent Labs  Lab 11/10/23 0602  NA 139  K 4.1  CL 93*  CO2 39*  GLUCOSE 144*  BUN 28*  CREATININE 0.89  CALCIUM  9.3  MG 1.7  PHOS 4.0   GFR: Estimated Creatinine Clearance: 57.7 mL/min (by C-G formula based on SCr of 0.89 mg/dL). Liver Function Tests: No results for input(s): AST, ALT, ALKPHOS, BILITOT, PROT, ALBUMIN in the last 168 hours. No results for input(s): LIPASE, AMYLASE in  the last 168 hours. No results for input(s): AMMONIA in the last 168 hours. Coagulation Profile: No results for input(s): INR, PROTIME in the last 168 hours. Cardiac Enzymes: No results for input(s): CKTOTAL, CKMB, CKMBINDEX, TROPONINI in the last 168 hours. BNP (last 3 results) No results for input(s): PROBNP in the last 8760 hours. HbA1C: No results for input(s): HGBA1C in the last 72 hours. CBG: Recent Labs  Lab 11/09/23 1132 11/09/23 1556 11/09/23 2114 11/10/23 0800 11/10/23 1212  GLUCAP 232* 222* 161* 267* 213*   Lipid Profile: No results for input(s): CHOL, HDL, LDLCALC, TRIG, CHOLHDL, LDLDIRECT in the last 72 hours. Thyroid  Function Tests: No results for input(s): TSH, T4TOTAL, FREET4, T3FREE, THYROIDAB in the last 72 hours. Anemia Panel: No results for input(s): VITAMINB12, FOLATE, FERRITIN, TIBC, IRON, RETICCTPCT in the last 72 hours. Sepsis Labs: No results for input(s): PROCALCITON, LATICACIDVEN in the last 168 hours.  No results found for this or any previous visit (from the past 240 hours).  Radiology Studies: No results found.  Scheduled Meds:  apixaban   5 mg Oral BID   benzonatate   100 mg Oral BID   docusate sodium   100 mg Oral BID   furosemide   40 mg Oral BID   insulin  aspart  0-20 Units Subcutaneous TID WC   insulin  aspart  0-5 Units Subcutaneous QHS   metoprolol  succinate  50 mg Oral Daily   predniSONE   10 mg Oral Q breakfast   sodium chloride  flush  3 mL Intravenous Q12H   Continuous Infusions:   LOS: 15 days    Time spent: 50 mins    Darcel Dawley, MD [facility]   If 7PM-7AM, please contact night-coverage

## 2023-11-10 NOTE — TOC Progression Note (Addendum)
 Transition of Care [facility]) - Progression Note    Patient Details  Name: [name] MRN: [id number] Date of Birth: 1945/11/07  [facility]) CM/SW Contact  Graves-Bigelow, Erminio Deems, RN Phone Number: 11/10/2023, 10:06 AM  Clinical Narrative: Case Manager received notification that the BIPAP has been approved via insurance. [employer] [facility] will deliver the BIPAP to the patient today. Patient will have home health services via [facility]- RN/PT/OT/-orders to follow. Hospital bed has been delivered to the home. No further needs identified at this time.   1106 11-10-23 [employer] stopped by to educate the daughter and patient. Daughter had to go home and a new time scheduled between 8-10:00 am for education on Wednesday.  No further needs identified.   Expected Discharge Plan: Home w Home Health Services Barriers to Discharge: No Barriers Identified  Expected Discharge Plan and Services In-house Referral: Clinical Social Work Discharge Planning Services: CM Consult Post Acute Care Choice: Home Health, Durable Medical Equipment Living arrangements for the past 2 months: Single Family Home                 DME Arranged: Hospital bed, NIV DME Agency: [facility] Date DME Agency Contacted: 10/29/23 Time DME Agency Contacted: 1027 Representative spoke with at DME Agency: [facility] HH Arranged: RN, Disease Management, PT, Nurse's Aide HH Agency: [facility] Date [facility] Agency Contacted: 10/29/23 Time HH Agency Contacted: 1028 Representative spoke with at [facility] Agency: [facility]  Social Drivers of Health (SDOH) Interventions SDOH Screenings   Food Insecurity: No Food Insecurity (10/26/2023)  Housing: Low Risk  (10/26/2023)  Transportation Needs: No Transportation Needs (10/26/2023)  Utilities: Not At Risk (10/26/2023)  Financial Resource Strain: Low Risk  (12/09/2018)  Social Connections: Unknown (10/26/2023)  Tobacco Use: Low Risk  (10/26/2023)    Readmission Risk  Interventions    10/22/2023    4:58 PM  Readmission Risk Prevention Plan  Medication Screening Complete  Transportation Screening Complete

## 2023-11-11 ENCOUNTER — Other Ambulatory Visit (HOSPITAL_COMMUNITY): Payer: Self-pay

## 2023-11-11 DIAGNOSIS — J849 Interstitial pulmonary disease, unspecified: Secondary | ICD-10-CM | POA: Diagnosis not present

## 2023-11-11 LAB — GLUCOSE, CAPILLARY: Glucose-Capillary: 202 mg/dL — ABNORMAL HIGH (ref 70–99)

## 2023-11-11 MED ORDER — PREDNISONE 10 MG PO TABS
10.0000 mg | ORAL_TABLET | Freq: Every day | ORAL | 0 refills | Status: AC
  Filled 2023-11-11: qty 30, 30d supply, fill #0

## 2023-11-11 NOTE — Progress Notes (Signed)
 Physical Therapy Treatment Patient Details Name: [name] MRN: [id number] DOB: [birth year] Today's Date: 11/11/2023   History of Present Illness 78 yo female adm 10/26/23 with AMS, weakness, hypercarbic and acute encephalopathy. PMHx: Admission 8/6-8/9 with ILD flare. Chronic resp failure on 2L, ILD, CHF, CAD s/p CABG, PAF, SSS s/p PPM, T2DM    PT Comments  Pt agreeable to session, able to make great progress in ambulation distance but continues to demo poor endurance and need for increased O2 and seated rest with exertion. Pt needing up to 4L and 1-2 min seated rest to recover, educated on progressive walking program and monitoring of exertion and SOB. Pt verbalized understanding, anticipating d/c home today with family support.     If plan is discharge home, recommend the following: Assist for transportation;A little help with walking and/or transfers;Assistance with cooking/housework;A little help with bathing/dressing/bathroom   Can travel by private vehicle     Yes  Equipment Recommendations  Rollator (4 wheels) (bariatric)    Recommendations for Other Services       Precautions / Restrictions Precautions Precautions: Fall;Other (comment) Recall of Precautions/Restrictions: Intact Precaution/Restrictions Comments: monitor O2 (2-3 L O2 at baseline) Restrictions Weight Bearing Restrictions Per Provider Order: No     Mobility  Bed Mobility Overal bed mobility: Modified Independent             General bed mobility comments: EOB at start and end of session    Transfers Overall transfer level: Modified independent Equipment used: Rollator (4 wheels) Transfers: Sit to/from Stand Sit to Stand: Supervision           General transfer comment: supervision in session, completed with and without rollator. good ability to lock, turn, and sit while walking without assist    Ambulation/Gait Ambulation/Gait assistance: Supervision Gait Distance (Feet): 75 Feet (+ 50  + 166ft) Assistive device: Rollator (4 wheels) Gait Pattern/deviations: Step-through pattern, Decreased stride length, Trunk flexed Gait velocity: decreased Gait velocity interpretation: <1.31 ft/sec, indicative of household ambulator   General Gait Details: cues for posture and PLB, rest breaks at 75, 50, and then after 125 ft with SpO2 to 86% on 3L, increased to 4   Stairs             Wheelchair Mobility     Tilt Bed    Modified Rankin (Stroke Patients Only)       Balance Overall balance assessment: Needs assistance Sitting-balance support: No upper extremity supported, Feet supported Sitting balance-Leahy Scale: Good Sitting balance - Comments: EOB without support   Standing balance support: Bilateral upper extremity supported, During functional activity, Reliant on assistive device for balance, No upper extremity supported Standing balance-Leahy Scale: Fair Standing balance comment: static standing without support, rollator for gait                            Communication Communication Communication: No apparent difficulties  Cognition Arousal: Alert Behavior During Therapy: WFL for tasks assessed/performed   PT - Cognitive impairments: No apparent impairments                         Following commands: Intact Following commands impaired: Follows multi-step commands with increased time    Cueing Cueing Techniques: Verbal cues  Exercises      General Comments General comments (skin integrity, edema, etc.): SpO2 to 86% on 2-3L while ambulating, increased to 4L with SpO2 91%, back to 2L at rest  with SpO2 94%      Pertinent Vitals/Pain Pain Assessment Pain Assessment: No/denies pain Pain Intervention(s): Monitored during session    Home Living                          Prior Function            PT Goals (current goals can now be found in the care plan section) Acute Rehab PT Goals Patient Stated Goal: to return to  not using O2 PT Goal Formulation: With patient Time For Goal Achievement: 11/25/23 Potential to Achieve Goals: Fair Progress towards PT goals: Progressing toward goals    Frequency    Min 2X/week      PT Plan      Co-evaluation              AM-PAC PT 6 Clicks Mobility   Outcome Measure  Help needed turning from your back to your side while in a flat bed without using bedrails?: None Help needed moving from lying on your back to sitting on the side of a flat bed without using bedrails?: None Help needed moving to and from a bed to a chair (including a wheelchair)?: None Help needed standing up from a chair using your arms (e.g., wheelchair or bedside chair)?: None Help needed to walk in hospital room?: A Little Help needed climbing 3-5 steps with a railing? : A Lot 6 Click Score: 21    End of Session Equipment Utilized During Treatment: Gait belt;Oxygen  Activity Tolerance: Patient tolerated treatment well Patient left: with call bell/phone within reach;in bed;with family/visitor present Nurse Communication: Mobility status PT Visit Diagnosis: Other abnormalities of gait and mobility (R26.89);Muscle weakness (generalized) (M62.81);Difficulty in walking, not elsewhere classified (R26.2)     Time: 9187-9148 PT Time Calculation (min) (ACUTE ONLY): 39 min  Charges:    $Gait Training: 8-22 mins $Therapeutic Exercise: 23-37 mins PT General Charges $$ ACUTE PT VISIT: 1 Visit                     Izetta Call, PT, DPT   Acute Rehabilitation Department Office [phone] Secure Chat Communication Preferred   Izetta JULIANNA Call 11/11/2023, 10:40 AM

## 2023-11-11 NOTE — Plan of Care (Signed)
  Problem: Clinical Measurements: Goal: Respiratory complications will improve Outcome: Progressing Goal: Cardiovascular complication will be avoided Outcome: Progressing   Problem: Activity: Goal: Risk for activity intolerance will decrease Outcome: Progressing   Problem: Coping: Goal: Level of anxiety will decrease Outcome: Progressing   Problem: Safety: Goal: Ability to remain free from injury will improve Outcome: Progressing   Problem: Activity: Goal: Ability to tolerate increased activity will improve Outcome: Progressing   Problem: Respiratory: Goal: Ability to maintain adequate ventilation will improve Outcome: Progressing

## 2023-11-11 NOTE — TOC Transition Note (Signed)
 Transition of Care [facility]) - Discharge Note   Patient Details  Name: [name] MRN: [id number] Date of Birth: 1945/03/26  Transition of Care [facility]) CM/SW Contact:  Sudie Erminio Deems, RN Phone Number: 11/11/2023, 11:02 AM   Clinical Narrative: Patient will transition home today once education has been completed for BIPAP. Daughter states she will transport patient home via private vehicle. No further needs identified at this time.     Final next level of care: Home w Home Health Services Barriers to Discharge: No Barriers Identified   Patient Goals and CMS Choice Patient states their goals for this hospitalization and ongoing recovery are:: daughter wants patient to return home  Discharge Plan and Services Additional resources added to the After Visit Summary for   In-house Referral: Clinical Social Work Discharge Planning Services: CM Consult Post Acute Care Choice: Home Health, Durable Medical Equipment          DME Arranged: Hospital bed, NIV DME Agency: [facility] Date DME Agency Contacted: 10/29/23 Time DME Agency Contacted: 1027 Representative spoke with at DME Agency: [facility] HH Arranged: RN, Disease Management, PT, Nurse's Aide HH Agency: [facility] Date [facility] Agency Contacted: 10/29/23 Time HH Agency Contacted: 1028 Representative spoke with at [facility] Agency: [facility]  Social Drivers of Health (SDOH) Interventions SDOH Screenings   Food Insecurity: No Food Insecurity (10/26/2023)  Housing: Low Risk  (10/26/2023)  Transportation Needs: No Transportation Needs (10/26/2023)  Utilities: Not At Risk (10/26/2023)  Financial Resource Strain: Low Risk  (12/09/2018)  Social Connections: Unknown (10/26/2023)  Tobacco Use: Low Risk  (10/26/2023)   Readmission Risk Interventions    10/22/2023    4:58 PM  Readmission Risk Prevention Plan  Medication Screening Complete  Transportation Screening Complete

## 2023-11-11 NOTE — Discharge Summary (Signed)
 Physician Discharge Summary  [name] FMW:[id number] DOB: July 07, 1945 DOA: 10/26/2023  PCP: Larnell Hamilton, MD  Admit date: 10/26/2023  Discharge date: 11/11/2023  Admitted From: Home  Disposition:  Home  Recommendations for Outpatient Follow-up:  Follow up with PCP in 1-2 weeks. Please obtain BMP/CBC in one week. Advised to continue BiPAP at night for sleep apnea.  Home Health: Home PT/OT Equipment/Devices: Home Oxygen  / BIPAP  Discharge Condition: Stable CODE STATUS:DNR Diet recommendation: Heart Healthy   Brief Summary/ Hospital Course: This 78 yrs old female with medical history significant for chronic hypoxic respiratory failure on 2L home O2 associated with ILD, chronic HFpEF, CAD s/p CABG, PAF, pacemaker, and DM who presented with SOB.  She was last hospitalized from 8/6-8/9 with an acute exacerbation of pulmonary fibrosis and responded well to IV Solumedrol, empiric antibiotics, nebulizers, supplemental O2. She was discharged on PO Augmentin  + Azithromycin  and a prednisone  taper.  She is currently on 4L home O2.  Patient presents with confusion and worsening somnolence.  Found to be hypercarbic and transferred to the progressive care unit and placed on BiPAP.  Pulmonary consulted and changed her back to oral prednisone .  At this time patient is medically stable and agreeable for discharge home currently awaiting insurance approval for durable medical equipment to ensure safe disposition home.  Patient admits significant improvement and feels better wants to be discharged.  Home BiPAP has been arranged and delivered to the patient room.  Patient being discharged home.   Discharge Diagnoses:  Principal Problem:   ILD (interstitial lung disease) (HCC) Active Problems:   Pacemaker- March 2016    Hx of CABG 2005   Paroxysmal atrial fibrillation (HCC)   Class 2 obesity due to excess calories with body mass index (BMI) of 36.0 to 36.9 in adult   Mixed hyperlipidemia    Poorly controlled diabetes mellitus (HCC)   Hypertension   Chronic diastolic CHF (congestive heart failure) (HCC)   Healthcare-associated pneumonia  Hypersomnolence: Chronic hypoxemic and hypercapnic respiratory failure: -Somnolence resolved after using BiPAP.   -Presented with hypersomnolence and altered mental status -Likely in setting of hypercapnic respiratory failure, PCO2  elevated at 84 per VBG from 10/27/2023 - pCO2 improved to 75 with BiPAP; continue BiPAP as tolerated - Patient is more alert and awake.  Answering question appropriately. - Pulmonology evaluated the patient,  recommended to send home with BiPAP. - Awaiting BiPAP to be delivered at home by insurance company; will likely have BiPAP delivered in next 2 to 3 days. - BiPAP delivered to the patient's room.  Patient is being discharged home.   Acute encephalopathy > resolved. Hyperammonemia  ruled out. - Likely in setting of above. - TSH 0.126, B12 1384, HIV nonreactive. - EEG showed that the study was suggestive of moderate to severe diffuse encephalopathy with no seizures or epileptiform discharges seen throughout the recording.  -MRI brain without acute abnormality -Ammonia minimally elevated at 40, currently within normal limits, off lactulose .   Acute on chronic HFpEF: - Patient takes Lasix  40 mg p.o. twice daily at home. - Clinically volume overloaded with elevated BNP at intake - She was given  Lasix  40 mg IV every 12 hours and assess diuretic response. - IV Lasix  was discontinued and changed to PO Lasix  40 every 12 hours.  -Lasix  40 mg IV once given for worsening edema.   Recent pneumonia: -Treated with antibiotics and steroids. -Procalcitonin less than 0.10 -IV antibiotics stopped on 10/30/2023   ILD Flare:  Patient with recent  hospitalization for ILD flare.  -She was treated with course of steroids and antibiotics. -Currently on prednisone  .   Metabolic alkalosis -Resolved after Diamox  500 mg IV x 1  dose - CO2 elevated at 43, likely contraction alkalosis from diuresis as well as compensation from respiratory acidosis - VBG confirms bicarb of 46.7, likely has a compensation for respiratory acidosis. - Continue Lasix  40 mg p.o. daily .   Hypokalemia - Replete as appropriate.   Chronic HFpEF: Lasix  restarted 40 daily.   CAD S/p CABG: No c/o CP.   Afib: Has pacemaker and Rate controlled with Toprol  XL. C/w Anticoagulation w/ Apixaban .   HLD: On Repatha  at home.   DMType 2 uncontrolled with hyperglycemia: A1c 7.4   Normocytic anemia- CTM for S/Sx of Bleeding; No overt bleeding noted.    Class II Obesity: Complicates overall prognosis and care. Estimated body mass index is 36.79 kg/m as calculated from the following:   Height as of this encounter: 5' 3 (1.6 m).   Weight as of this encounter: 94.2 kg. Weight Loss and Dietary Counseling given    Discharge Instructions  Discharge Instructions     Call MD for:  difficulty breathing, headache or visual disturbances   Complete by: As directed    Call MD for:  persistant dizziness or light-headedness   Complete by: As directed    Call MD for:  persistant nausea and vomiting   Complete by: As directed    Diet - low sodium heart healthy   Complete by: As directed    Diet Carb Modified   Complete by: As directed    Discharge instructions   Complete by: As directed    Advised to follow-up with primary care physician in 1 week. Advised to continue BiPAP at night for sleep apnea   Increase activity slowly   Complete by: As directed    No wound care   Complete by: As directed       Allergies as of 11/11/2023       Reactions   Amiodarone  Other (See Comments)    pulmonary fibrosis   Gabapentin  Swelling   edema        Medication List     STOP taking these medications    amoxicillin -clavulanate 875-125 MG tablet Commonly known as: AUGMENTIN    azithromycin  500 MG tablet Commonly known as: Zithromax        TAKE  these medications    ALLERGY RELIEF PO Take 2 tablets by mouth as needed (allergies).   B-COMPLEX PO Take 1 capsule by mouth every morning.   benzonatate  100 MG capsule Commonly known as: TESSALON  Take 1 capsule (100 mg total) by mouth 2 (two) times daily.   Biotin 1000 MCG tablet Take 1,000 mcg by mouth daily.   Co Q-10 200 MG Caps Take 200 mg by mouth daily.   Eliquis  5 MG Tabs tablet Generic drug: apixaban  TAKE ONE TABLET TWICE DAILY   Farxiga 10 MG Tabs tablet Generic drug: dapagliflozin propanediol Take 10 mg by mouth daily.   furosemide  40 MG tablet Commonly known as: LASIX  Take 1 tablet (40 mg total) by mouth 2 (two) times daily.   guaiFENesin -dextromethorphan  100-10 MG/5ML syrup Commonly known as: ROBITUSSIN DM Take 5 mLs by mouth every 4 (four) hours as needed for cough.   HumaLOG  Mix 75/25 KwikPen (75-25) 100 UNIT/ML KwikPen Generic drug: Insulin  Lispro Prot & Lispro Inject 40 Units into the skin in the morning, at noon, and at bedtime.   ipratropium-albuterol  0.5-2.5 (3)  MG/3ML Soln Commonly known as: DUONEB Use 1 vial (3 mLs) by nebulization 4 (four) times daily.   metolazone 2.5 MG tablet Commonly known as: ZAROXOLYN Take 2.5 mg by mouth daily as needed (weight gain > 5lbs).   metoprolol  succinate 50 MG 24 hr tablet Commonly known as: TOPROL -XL Take 1 tablet (50 mg total) by mouth daily.   OXYGEN  Inhale 2 L into the lungs continuous.   predniSONE  10 MG tablet Commonly known as: DELTASONE  Take 1 tablet (10 mg total) by mouth daily with breakfast. Start taking on: November 12, 2023 What changed: See the new instructions.   Repatha  SureClick 140 MG/ML Soaj Generic drug: Evolocumab  Inject 140 mg into the skin every 14 (fourteen) days.               Durable Medical Equipment  (From admission, onward)           Start     Ordered   10/29/23 1035  For home use only DME Hospital bed  Once       Question Answer Comment  Length of Need  Lifetime   Patient has (list medical condition): chronic hypoxic respiratory failure (interstitial lung disease) chronic HFpEF, CAD s/p CABG, PAF, pacemaker, and DM who presented with SOB.   The above medical condition requires: Patient requires the ability to reposition frequently   Head must be elevated greater than: 45 degrees   Bed type Semi-electric   Hoyer Lift Yes   Support Surface: Gel Overlay      10/29/23 1034            Follow-up Information     [facility] Follow up.   Specialty: Home Health Services Why: Registered Nurse, Physical Therapy, and Aide-office to call with visit times. Contact information: [address] [place] [address] [phone]         [facility] Follow up.   Why: NIV and Hospital Bed for home. Contact information: [address] [place] [address] [phone]         Larnell Hamilton, MD Follow up in 1 week(s).   Specialty: Internal Medicine Contact information: [address] [place] [address] [phone]                Allergies  Allergen Reactions   Amiodarone  Other (See Comments)     pulmonary fibrosis   Gabapentin  Swelling    edema    Consultations: None   Procedures/Studies: MR BRAIN WO CONTRAST Result Date: 10/28/2023 CLINICAL DATA:  Initial evaluation for acute mental status change. EXAM: MRI HEAD WITHOUT CONTRAST TECHNIQUE: Multiplanar, multiecho pulse sequences of the brain and surrounding structures were obtained without intravenous contrast. COMPARISON:  CT from 10/26/2023 FINDINGS: Brain: Cerebral volume within normal limits. Mild patchy T2/FLAIR hyperintensity noted involving the supratentorial cerebral white matter, most likely related chronic microvascular ischemic disease, less than is typically seen for age. No abnormal foci of restricted diffusion to suggest acute or subacute ischemia. Nor is of chronic cortical infarction. No acute intracranial  hemorrhage. Few small chronic micro hemorrhages noted involving the left frontal lobe and left cerebellum. No mass lesion, midline shift or mass effect no hydrocephalus or extra-axial fluid collection. Pituitary gland within normal limits. Vascular: Major intracranial vascular flow voids are maintained. Skull and upper cervical spine: Craniocervical junction within normal limits. Bone marrow signal intensity normal. No scalp soft tissue abnormality. Hyperostosis frontalis interna noted. Sinuses/Orbits: Prior bilateral ocular lens replacement. Paranasal sinuses are largely clear. No significant  mastoid effusion. Other: None. IMPRESSION: 1. No acute intracranial abnormality. 2. Mild chronic microvascular ischemic disease for age. Electronically Signed   By: Morene Hoard M.D.   On: 10/28/2023 19:11   DG CHEST PORT 1 VIEW Result Date: 10/28/2023 CLINICAL DATA:  78 year old female with shortness of breath. EXAM: PORTABLE CHEST 1 VIEW COMPARISON:  Portable chest 10/26/2023 and earlier. FINDINGS: Portable AP semi upright view at 0549 hours. Continued low lung volumes. Prior CABG. Stable cardiomegaly and mediastinal contours. Stable left chest cardiac pacemaker. Chronic upper lung and perihilar predominant architectural distortion and vague opacity corresponding to CTA chest abnormality 10/21/2023. Left lung base ventilation appears improved since 10/26/2023. No pneumothorax. No definite pleural effusion or acute pulmonary opacity. Paucity of bowel gas in the upper abdomen. Stable visualized osseous structures. IMPRESSION: Chronic lung disease with upper lobe and perihilar predominant architectural distortion as seen on CTA 10/21/2023. Superimposed cardiomegaly. No new cardiopulmonary abnormality. Electronically Signed   By: VEAR Hurst M.D.   On: 10/28/2023 09:44   EEG adult Result Date: 10/27/2023 Shelton Arlin KIDD, MD     10/27/2023  2:10 PM Patient Name: [name] MRN: [id number] Epilepsy Attending:  Arlin KIDD Shelton Referring Physician/Provider: Sherrill Alejandro Donovan, DO Date: 10/27/2023 Duration: 22.53 mins Patient history: 78yo F with ams. EEG to evaluate for seizure Level of alertness: Awake/lethargic AEDs during EEG study: None Technical aspects: This EEG study was done with scalp electrodes positioned according to the 10-20 International system of electrode placement. Electrical activity was reviewed with band pass filter of 1-70Hz , sensitivity of 7 uV/mm, display speed of 56mm/sec with a 60Hz  notched filter applied as appropriate. EEG data were recorded continuously and digitally stored.  Video monitoring was available and reviewed as appropriate. Description: EEG showed continuous generalized polymorphic sharply contoured 3 to 6 Hz theta-delta slowing, at times with triphasic morphology. Hyperventilation and photic stimulation were not performed.   ABNORMALITY - Continuous slow, generalized IMPRESSION: This study is suggestive of moderate to severe diffuse encephalopathy. No seizures or epileptiform discharges were seen throughout the recording. Priyanka KIDD Shelton   CT HEAD WO CONTRAST ( ) Result Date: 10/26/2023 CLINICAL DATA:  Altered mental status, nontraumatic (Ped 0-17y) EXAM: CT HEAD WITHOUT CONTRAST TECHNIQUE: Contiguous axial images were obtained from the base of the skull through the vertex without intravenous contrast. RADIATION DOSE REDUCTION: This exam was performed according to the departmental dose-optimization program which includes automated exposure control, adjustment of the mA and/or kV according to patient size and/or use of iterative reconstruction technique. COMPARISON:  May 23, 2010 FINDINGS: Brain: The ventricles appear age appropriate. No mass effect or midline shift. Gray-white differentiation is preserved without focal attenuation abnormality.No evidence of acute territorial infarction, extra-axial fluid collection, hemorrhage, or mass lesion. The basilar cisterns are patent  without downward herniation. The cerebellar hemispheres and vermis are well formed without mass lesion or focal attenuation abnormality. Vascular: No hyperdense vessel. Calcified atherosclerotic plaque within the cavernous/supraclinoid ICA and intradural vertebral arteries. Skull: Normal. Negative for fracture or focal lesion. Sinuses/Orbits: The paranasal sinuses and mastoids are clear.The globes appear intact. No retrobulbar hematoma. Other: None. IMPRESSION: No acute intracranial abnormality, specifically, no acute hemorrhage, territorial infarction, or intracranial mass. Electronically Signed   By: Rogelia Myers M.D.   On: 10/26/2023 17:01   DG Chest Portable 1 View Result Date: 10/26/2023 CLINICAL DATA:  Pneumonia EXAM: PORTABLE CHEST 1 VIEW COMPARISON:  Chest radiograph October 21, 2021 at 12:35 p.m. FINDINGS: Cardiomediastinal silhouette is enlarged and obscured by overlying bilateral patchy  airspace opacities of both lung fields, increased to prior. Aortic knob is prominent and calcified. Left chest wall dual lead pacer in place with the leads overlying the right atrium and ventricle. Blunting of left costophrenic angle increased to prior. No acute osseous abnormality. Status post median sternotomy and CABG. IMPRESSION: Interval increase in bilateral patchy airspace opacities (edema/multifocal infection/inflammation), left greater than right with new blunting of left costophrenic angle query atelectasis versus effusion. Electronically Signed   By: Megan  Zare M.D.   On: 10/26/2023 13:49   ECHOCARDIOGRAM COMPLETE Result Date: 10/22/2023    ECHOCARDIOGRAM REPORT   Patient Name:   [name] Date of Exam: 10/22/2023 Medical Rec #:  [id number]        Height:       63.0 in Accession #:    [id number]       Weight:       210.5 lb Date of Birth:  08/22/1945        BSA:          1.976 m Patient Age:    78 years         BP:           142/59 mmHg Patient Gender: F                HR:           64 bpm. Exam  Location:  Inpatient Procedure: 2D Echo, Cardiac Doppler, Color Doppler and Intracardiac            Opacification Agent (Both Spectral and Color Flow Doppler were            utilized during procedure). Indications:    I50.31 Acute diastolic (congestive) heart failure  History:        Patient has prior history of Echocardiogram examinations, most                 recent 02/26/2022. Pacemaker and Prior CABG, Arrythmias:Atrial                 Fibrillation; Risk Factors:Hypertension, Diabetes, Dyslipidemia                 and Sleep Apnea.  Sonographer:    Damien Senior RDCS Referring Phys: [id number] EVA KATHEE PORE  Sonographer Comments: Technically difficult due to lung interference (active pneumonia) IMPRESSIONS  1. Left ventricular ejection fraction, by estimation, is 60 to 65%. The left ventricle has normal function. The left ventricle has no regional wall motion abnormalities. There is moderate left ventricular hypertrophy of the basal-septal segment. Left ventricular diastolic function could not be evaluated.  2. Right ventricular systolic function is normal. The right ventricular size is severely enlarged. There is severely elevated pulmonary artery systolic pressure. The estimated right ventricular systolic pressure is 71.6 mmHg.  3. Left atrial size was severely dilated.  4. Right atrial size was severely dilated.  5. The mitral valve is normal in structure. Mild mitral valve regurgitation. Moderate mitral stenosis. The mean mitral valve gradient is 7.5 mmHg. Moderate mitral annular calcification.  6. The aortic valve is tricuspid. Aortic valve regurgitation is mild. Aortic valve sclerosis/calcification is present, without any evidence of aortic stenosis. Aortic valve area, by VTI measures 2.84 cm. Aortic valve mean gradient measures 10.0 mmHg. Aortic valve Vmax measures 2.16 m/s.  7. The inferior vena cava is dilated in size with <50% respiratory variability, suggesting right atrial pressure of 15 mmHg.  FINDINGS  Left Ventricle: Left ventricular ejection fraction, by estimation,  is 60 to 65%. The left ventricle has normal function. The left ventricle has no regional wall motion abnormalities. Definity  contrast agent was given IV to delineate the left ventricular  endocardial borders. The left ventricular internal cavity size was normal in size. There is moderate left ventricular hypertrophy of the basal-septal segment. Left ventricular diastolic function could not be evaluated. Right Ventricle: The right ventricular size is severely enlarged. No increase in right ventricular wall thickness. Right ventricular systolic function is normal. There is severely elevated pulmonary artery systolic pressure. The tricuspid regurgitant velocity is 3.76 m/s, and with an assumed right atrial pressure of 15 mmHg, the estimated right ventricular systolic pressure is 71.6 mmHg. Left Atrium: Left atrial size was severely dilated. Right Atrium: Right atrial size was severely dilated. Pericardium: There is no evidence of pericardial effusion. Mitral Valve: The mitral valve is normal in structure. Moderate mitral annular calcification. Mild mitral valve regurgitation. Moderate mitral valve stenosis. MV peak gradient, 23.3 mmHg. The mean mitral valve gradient is 7.5 mmHg. Tricuspid Valve: The tricuspid valve is normal in structure. Tricuspid valve regurgitation is mild . No evidence of tricuspid stenosis. Aortic Valve: The aortic valve is tricuspid. Aortic valve regurgitation is mild. Aortic valve sclerosis/calcification is present, without any evidence of aortic stenosis. Aortic valve mean gradient measures 10.0 mmHg. Aortic valve peak gradient measures 18.6 mmHg. Aortic valve area, by VTI measures 2.84 cm. Pulmonic Valve: The pulmonic valve was normal in structure. Pulmonic valve regurgitation is mild. No evidence of pulmonic stenosis. Aorta: The aortic root is normal in size and structure. Venous: The inferior vena cava is dilated  in size with less than 50% respiratory variability, suggesting right atrial pressure of 15 mmHg. IAS/Shunts: No atrial level shunt detected by color flow Doppler. Additional Comments: A device lead is visualized.  LEFT VENTRICLE PLAX 2D LVIDd:         3.70 cm LVIDs:         2.50 cm LV PW:         1.10 cm LV IVS:        1.50 cm LVOT diam:     2.60 cm LV SV:         132 LV SV Index:   67 LVOT Area:     5.31 cm  LEFT ATRIUM              Index        RIGHT ATRIUM           Index LA diam:        4.40 cm  2.23 cm/m   RA Area:     30.50 cm LA Vol (A2C):   105.0 ml 53.13 ml/m  RA Volume:   102.00 ml 51.61 ml/m LA Vol (A4C):   128.0 ml 64.77 ml/m LA Biplane Vol: 117.0 ml 59.20 ml/m  AORTIC VALVE AV Area (Vmax):    2.56 cm AV Area (Vmean):   2.73 cm AV Area (VTI):     2.84 cm AV Vmax:           215.67 cm/s AV Vmean:          146.667 cm/s AV VTI:            0.464 m AV Peak Grad:      18.6 mmHg AV Mean Grad:      10.0 mmHg LVOT Vmax:         104.00 cm/s LVOT Vmean:        75.400 cm/s LVOT  VTI:          0.248 m LVOT/AV VTI ratio: 0.53  AORTA Ao Root diam: 3.10 cm Ao Asc diam:  3.70 cm MITRAL VALVE               TRICUSPID VALVE MV Area (PHT): 1.62 cm    TR Peak grad:   56.6 mmHg MV Area VTI:   1.81 cm    TR Vmax:        376.00 cm/s MV Peak grad:  23.3 mmHg MV Mean grad:  7.5 mmHg    SHUNTS MV Vmax:       2.42 m/s    Systemic VTI:  0.25 m MV Vmean:      116.5 cm/s  Systemic Diam: 2.60 cm Wilbert Bihari MD Electronically signed by Wilbert Bihari MD Signature Date/Time: 10/22/2023/1:48:06 PM    Final    CT Angio Chest PE W and/or Wo Contrast Result Date: 10/21/2023 CLINICAL DATA:  Pulmonary embolism (PE) suspected, high prob EXAM: CT ANGIOGRAPHY CHEST WITH CONTRAST TECHNIQUE: Multidetector CT imaging of the chest was performed using the standard protocol during bolus administration of intravenous contrast. Multiplanar CT image reconstructions and MIPs were obtained to evaluate the vascular anatomy. RADIATION DOSE REDUCTION:  This exam was performed according to the departmental dose-optimization program which includes automated exposure control, adjustment of the mA and/or kV according to patient size and/or use of iterative reconstruction technique. CONTRAST:  60mL OMNIPAQUE  IOHEXOL  350 MG/ML SOLN COMPARISON:  December 14, 2021 FINDINGS: Pulmonary Embolism: No pulmonary embolism. Cardiovascular: Moderate cardiomegaly severe multi-vessel coronary atherosclerosis with postsurgical changes of a prior CABG.Left chest pacemaker with leads terminating in the right atrium and right ventricle.No aortic aneurysm. Diffuse aortic atherosclerosis. Mediastinum/Nodes: No mediastinal mass. Mildly enlarged multistation mediastinal lymph nodes, measuring up to 1.5 cm, likely reactive. Lungs/Pleura: The midline trachea and bronchi are patent. Similar appearance of the mosaic attenuation with an upper lobe predominance throughout the lungs. Increasing ground-glass attenuation within the right upper lobe, right middle lobe, and basilar segments of the right lower lobe. Increasing ground-glass attenuation also noted in the lingula and left lower lobe. No pneumothorax. No pleural effusion. Musculoskeletal: No acute fracture or destructive bone lesion. Sternotomy wires. Multilevel degenerative disc disease of the spine. Upper Abdomen: No acute abnormality in the partially visualized upper abdomen. Reflux of contrast into the hepatic veins, consistent with underlying cardiac dysfunction. Review of the MIP images confirms the above findings. IMPRESSION: 1. 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 Alternatively, drug-induced lung disease could be a possibility. Increasing ground-glass attenuation within the lungs, as described above, possibly artifactual from lower lung volumes versus multifocal pneumonia or pulmonary edema. 2. Cardiomegaly with findings of underlying cardiac dysfunction. 3.  Unchanged, mildly enlarged multistation mediastinal lymph nodes, likely reactive. Aortic Atherosclerosis (ICD10-I70.0). Electronically Signed   By: Rogelia Myers M.D.   On: 10/21/2023 18:08    Subjective: Patient was seen and examined at bedside.  Overnight events noted. Patient reports feeling much improved and wants to be discharged.  Home BiPAP has been delivered to the room.  Discharge Exam: Vitals:   11/11/23 0432 11/11/23 0803  BP: (!) 162/65 (!) 141/54  Pulse: (!) 59 60  Resp: 19 20  Temp: 97.6 F (36.4 C) 98 F (36.7 C)  SpO2: 97% 98%   Vitals:   11/10/23 2320 11/10/23 2327 11/11/23 0432 11/11/23 0803  BP:  (!) 169/74 (!) 162/65 (!) 141/54  Pulse:  61 (!) 59 60  Resp:  18 19 20   Temp:  97.8 F (36.6 C) 97.6 F (36.4 C) 98 F (36.7 C)  TempSrc:  Axillary Axillary Oral  SpO2: 99% 97% 97% 98%  Weight:      Height:        General: Pt is alert, awake, not in acute distress Cardiovascular: RRR, S1/S2 +, no rubs, no gallops Respiratory: CTA bilaterally, no wheezing, no rhonchi Abdominal: Soft, NT, ND, bowel sounds + Extremities: no edema, no cyanosis    The results of significant diagnostics from this hospitalization (including imaging, microbiology, ancillary and laboratory) are listed below for reference.     Microbiology: No results found for this or any previous visit (from the past 240 hours).   Labs: BNP (last 3 results) Recent Labs    10/21/23 1514 10/22/23 0229 10/26/23 1152  BNP 210.2* 265.7* 323.5*   Basic Metabolic Panel: Recent Labs  Lab 11/10/23 0602  NA 139  K 4.1  CL 93*  CO2 39*  GLUCOSE 144*  BUN 28*  CREATININE 0.89  CALCIUM  9.3  MG 1.7  PHOS 4.0   Liver Function Tests: No results for input(s): AST, ALT, ALKPHOS, BILITOT, PROT, ALBUMIN in the last 168 hours. No results for input(s): LIPASE, AMYLASE in the last 168 hours. No results for input(s): AMMONIA in the last 168 hours. CBC: Recent Labs  Lab  11/10/23 0602  WBC 9.8  HGB 11.1*  HCT 35.9*  MCV 90.4  PLT 206   Cardiac Enzymes: No results for input(s): CKTOTAL, CKMB, CKMBINDEX, TROPONINI in the last 168 hours. BNP: Invalid input(s): POCBNP CBG: Recent Labs  Lab 11/10/23 0800 11/10/23 1212 11/10/23 1612 11/10/23 2102 11/11/23 0754  GLUCAP 267* 213* 131* 293* 202*   D-Dimer No results for input(s): DDIMER in the last 72 hours. Hgb A1c No results for input(s): HGBA1C in the last 72 hours. Lipid Profile No results for input(s): CHOL, HDL, LDLCALC, TRIG, CHOLHDL, LDLDIRECT in the last 72 hours. Thyroid  function studies No results for input(s): TSH, T4TOTAL, T3FREE, THYROIDAB in the last 72 hours.  Invalid input(s): FREET3 Anemia work up No results for input(s): VITAMINB12, FOLATE, FERRITIN, TIBC, IRON, RETICCTPCT in the last 72 hours. Urinalysis    Component Value Date/Time   COLORURINE YELLOW 10/28/2023 0129   APPEARANCEUR HAZY (A) 10/28/2023 0129   LABSPEC 1.018 10/28/2023 0129   PHURINE 5.0 10/28/2023 0129   GLUCOSEU >=500 (A) 10/28/2023 0129   HGBUR NEGATIVE 10/28/2023 0129   BILIRUBINUR NEGATIVE 10/28/2023 0129   KETONESUR NEGATIVE 10/28/2023 0129   PROTEINUR NEGATIVE 10/28/2023 0129   UROBILINOGEN 1.0 07/30/2014 0001   NITRITE NEGATIVE 10/28/2023 0129   LEUKOCYTESUR NEGATIVE 10/28/2023 0129   Sepsis Labs Recent Labs  Lab 11/10/23 0602  WBC 9.8   Microbiology No results found for this or any previous visit (from the past 240 hours).   Time coordinating discharge: Over 30 minutes  SIGNED:   Darcel Dawley, MD  [facility] 11/11/2023, 2:02 PM Pager   If 7PM-7AM, please contact night-coverage

## 2023-11-11 NOTE — Discharge Instructions (Signed)
 Advised to follow-up with primary care physician in 1 week. Advised to continue BiPAP at night for sleep apnea

## 2023-11-12 NOTE — Progress Notes (Signed)
 Remote pacemaker transmission.

## 2023-11-12 NOTE — Addendum Note (Signed)
 Addended by: VICCI SELLER A on: 11/12/2023 02:21 PM   Modules accepted: Orders

## 2023-11-25 ENCOUNTER — Ambulatory Visit (INDEPENDENT_AMBULATORY_CARE_PROVIDER_SITE_OTHER): Admitting: Pulmonary Disease

## 2023-11-25 ENCOUNTER — Encounter: Payer: Self-pay | Admitting: Pulmonary Disease

## 2023-11-25 VITALS — BP 138/62 | HR 89 | Ht 63.0 in | Wt 200.8 lb

## 2023-11-25 DIAGNOSIS — J849 Interstitial pulmonary disease, unspecified: Secondary | ICD-10-CM | POA: Diagnosis not present

## 2023-11-25 DIAGNOSIS — I272 Pulmonary hypertension, unspecified: Secondary | ICD-10-CM | POA: Diagnosis not present

## 2023-11-25 DIAGNOSIS — J9611 Chronic respiratory failure with hypoxia: Secondary | ICD-10-CM | POA: Diagnosis not present

## 2023-11-25 NOTE — Patient Instructions (Signed)
 No change to the medication  Continue prednisone  and you can discontinue this when you run out  Let me know if we need to do anything about the battery or the portable oxygen  concentrator, if so just let me know and I can place new orders etc.  Return to clinic in 3 months or sooner as needed with Dr. Annella

## 2023-11-25 NOTE — Progress Notes (Signed)
 @Patient  ID: [name], female    DOB: 07/10/1945, 78 y.o.   MRN: [id number]  Chief Complaint  Patient presents with  . Medical Management of Chronic Issues    Referring provider: Larnell Hamilton, MD  HPI:   78 y.o. woman whom we are seeing for evaluation of pulmonary hypertension confirmed right heart cath 02/2022, ILD, chronic hypoxemic respiratory failure here for hospital follow-up visit.  Multiple hospital notes reviewed.  Multiple pulmonary notes in the hospital reviewed.  Has improved.  Back to baseline dyspnea and oxygen  requirement.  Was hospitalized with pneumonia.  Had preceding chest pain.  Was treated in the hospital with antibiotics and discharged antibiotics and steroid taper.  CT shows stable ground glass opacities and similar pattern to 2023.  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  After discharge she had worsening shortness of breath.  Brought to the hospital.  Was clinically in volume overload.  Notably BNP preceding that was as high as is ever been.  Likely CHF exacerbation with worsening hypoxemia and CO2 retention.  She was diuresed.  Eventually improved.  Now back home.  We discussed again medication to treat possible underlying HSP.  No evidence of this.  Given stability likely this is Amio toxicity versus tracheobronchomalacia chronic changes air trapping etc.  She does not want take any more medications.  She defers additional medications for this.  HPI at initial visit: Patiently recently reestablished with pulmonary clinic.  Increased cough.  Some dyspnea on exertion as well.  In the past and followed with Dr. Geronimo.  CT scan with interstitial changes, concern for hypersensitivity pneumonitis.  Did not follow-up.  Seen recently.  CT high-res shows worsening interlobular septal thickening, mosaicism, scattered nodular opacities concerning for  hypersensitivity pneumonitis, sarcoidosis considered.  PA appeared enlarged on that CT scan.  This prompted TTE which demonstrated RV dysfunction, RA dilation.  Review of TTE dating back to 2020 shows RV dysfunction.  Not present in 2018.  She has dyspnea.  With relatively robust exertion.  Normal walking outside bed.  Worse with inclines or stairs.  Worsening a bit over the last several months to year or 2.  PMH: Atrial fibrillation, hypertension, diastolic congestive heart failure, CAD Surgical history: CABG, cholecystectomy Family history:Mother with hypertension, CAD, hyperlipidemia, father with diabetes, CAD, CVA Social history: Never smoker, lives in [facility] [place]     Questionaires / Pulmonary Flowsheets:   ACT:      No data to display          MMRC:     No data to display          Epworth:      No data to display          Tests:   FENO:  No results found for: NITRICOXIDE  PFT:    Latest Ref Rng & Units 08/29/2021    9:55 AM 03/01/2019    8:56 AM 12/12/2018    4:50 PM 12/28/2017    3:47 PM  PFT Results  FVC-Pre L 1.25  1.55  1.23    FVC-Predicted Pre % 45  54  42  56   FVC-Post L 1.25    1.61   FVC-Predicted Post % 45    55   Pre FEV1/FVC % % 93  92  92  91   Post FEV1/FCV % % 93    91   FEV1-Pre L 1.16  1.43  1.12  1.49   FEV1-Predicted Pre % 56  66  52  68   FEV1-Post L 1.17    1.47   DLCO uncorrected ml/min/mmHg 10.88  18.22  11.35  15.97   DLCO UNC% % 57  95  59  67   DLCO corrected ml/min/mmHg 10.88   12.17    DLCO COR %Predicted % 57   64    DLVA Predicted % 122  155  152  121   TLC L 2.71    2.93   TLC % Predicted % 54    58   RV % Predicted % 54    52   02/2019 reviewed and interpreted as spirometry suggestive of moderate restriction, DLCO within normal limits 11/2018 reviewed and interpreted as chronically suggestive of moderate restriction, DLCO moderately reduced 12/2017 reviewed and interpreted as primary suggestive of  my restriction versus air trapping, no bronchodilator response, lung volumes consistent with moderate restriction, DLCO moderately reduced 08/2021 reviewed interpreted spirometry suggestive of severe restriction versus air trapping, no bronchodilator response, lung volumes consistent with moderate restriction with moderately reduced DLCO WALK:     06/28/2019   11:15 AM 03/01/2019   10:31 AM 12/28/2018    3:27 PM 02/04/2018    9:39 AM  SIX MIN WALK  Supplimental Oxygen  during Test? (L/min) No No No No  Tech Comments: slow pace walk, SOB after first lap, three stops to rest, sats WNL normal pace, knee pain, no dsat TA/CMA Pt did not want to continue due to leg pain. Pt did have some dypsnea. Pt ambulated at a slow pace with a steady gait.     Imaging: Personally reviewed MR BRAIN WO CONTRAST Result Date: 10/28/2023 CLINICAL DATA:  Initial evaluation for acute mental status change. EXAM: MRI HEAD WITHOUT CONTRAST TECHNIQUE: Multiplanar, multiecho pulse sequences of the brain and surrounding structures were obtained without intravenous contrast. COMPARISON:  CT from 10/26/2023 FINDINGS: Brain: Cerebral volume within normal limits. Mild patchy T2/FLAIR hyperintensity noted involving the supratentorial cerebral white matter, most likely related chronic microvascular ischemic disease, less than is typically seen for age. No abnormal foci of restricted diffusion to suggest acute or subacute ischemia. Nor is of chronic cortical infarction. No acute intracranial hemorrhage. Few small chronic micro hemorrhages noted involving the left frontal lobe and left cerebellum. No mass lesion, midline shift or mass effect no hydrocephalus or extra-axial fluid collection. Pituitary gland within normal limits. Vascular: Major intracranial vascular flow voids are maintained. Skull and upper cervical spine: Craniocervical junction within normal limits. Bone marrow signal intensity normal. No scalp soft tissue abnormality.  Hyperostosis frontalis interna noted. Sinuses/Orbits: Prior bilateral ocular lens replacement. Paranasal sinuses are largely clear. No significant mastoid effusion. Other: None. IMPRESSION: 1. No acute intracranial abnormality. 2. Mild chronic microvascular ischemic disease for age. Electronically Signed   By: Morene Hoard M.D.   On: 10/28/2023 19:11   DG CHEST PORT 1 VIEW Result Date: 10/28/2023 CLINICAL DATA:  78 year old female with shortness of breath. EXAM: PORTABLE CHEST 1 VIEW COMPARISON:  Portable chest 10/26/2023 and earlier. FINDINGS: Portable AP semi upright view at 0549 hours. Continued low lung volumes. Prior CABG. Stable cardiomegaly and mediastinal contours. Stable left chest cardiac pacemaker. Chronic upper lung and perihilar predominant architectural distortion and vague opacity corresponding to CTA chest abnormality 10/21/2023. Left lung base ventilation appears improved since 10/26/2023. No pneumothorax. No definite pleural effusion or acute pulmonary opacity. Paucity of bowel gas in the upper abdomen. Stable visualized osseous structures.  IMPRESSION: Chronic lung disease with upper lobe and perihilar predominant architectural distortion as seen on CTA 10/21/2023. Superimposed cardiomegaly. No new cardiopulmonary abnormality. Electronically Signed   By: VEAR Hurst M.D.   On: 10/28/2023 09:44   EEG adult Result Date: 10/27/2023 Shelton Arlin KIDD, MD     10/27/2023  2:10 PM Patient Name: QUNISHA BRYK MRN: [id number] Epilepsy Attending: Arlin KIDD Shelton Referring Physician/Provider: Sherrill Alejandro Donovan, DO Date: 10/27/2023 Duration: 22.53 mins Patient history: 78yo F with ams. EEG to evaluate for seizure Level of alertness: Awake/lethargic AEDs during EEG study: None Technical aspects: This EEG study was done with scalp electrodes positioned according to the 10-20 International system of electrode placement. Electrical activity was reviewed with band pass filter of 1-70Hz , sensitivity of  7 uV/mm, display speed of 68mm/sec with a 60Hz  notched filter applied as appropriate. EEG data were recorded continuously and digitally stored.  Video monitoring was available and reviewed as appropriate. Description: EEG showed continuous generalized polymorphic sharply contoured 3 to 6 Hz theta-delta slowing, at times with triphasic morphology. Hyperventilation and photic stimulation were not performed.   ABNORMALITY - Continuous slow, generalized IMPRESSION: This study is suggestive of moderate to severe diffuse encephalopathy. No seizures or epileptiform discharges were seen throughout the recording. Priyanka KIDD Shelton   CT HEAD WO CONTRAST ( ) Result Date: 10/26/2023 CLINICAL DATA:  Altered mental status, nontraumatic (Ped 0-17y) EXAM: CT HEAD WITHOUT CONTRAST TECHNIQUE: Contiguous axial images were obtained from the base of the skull through the vertex without intravenous contrast. RADIATION DOSE REDUCTION: This exam was performed according to the departmental dose-optimization program which includes automated exposure control, adjustment of the mA and/or kV according to patient size and/or use of iterative reconstruction technique. COMPARISON:  May 23, 2010 FINDINGS: Brain: The ventricles appear age appropriate. No mass effect or midline shift. Gray-white differentiation is preserved without focal attenuation abnormality.No evidence of acute territorial infarction, extra-axial fluid collection, hemorrhage, or mass lesion. The basilar cisterns are patent without downward herniation. The cerebellar hemispheres and vermis are well formed without mass lesion or focal attenuation abnormality. Vascular: No hyperdense vessel. Calcified atherosclerotic plaque within the cavernous/supraclinoid ICA and intradural vertebral arteries. Skull: Normal. Negative for fracture or focal lesion. Sinuses/Orbits: The paranasal sinuses and mastoids are clear.The globes appear intact. No retrobulbar hematoma. Other: None.  IMPRESSION: No acute intracranial abnormality, specifically, no acute hemorrhage, territorial infarction, or intracranial mass. Electronically Signed   By: Rogelia Myers M.D.   On: 10/26/2023 17:01   DG Chest Portable 1 View Result Date: 10/26/2023 CLINICAL DATA:  Pneumonia EXAM: PORTABLE CHEST 1 VIEW COMPARISON:  Chest radiograph October 21, 2021 at 12:35 p.m. FINDINGS: Cardiomediastinal silhouette is enlarged and obscured by overlying bilateral patchy airspace opacities of both lung fields, increased to prior. Aortic knob is prominent and calcified. Left chest wall dual lead pacer in place with the leads overlying the right atrium and ventricle. Blunting of left costophrenic angle increased to prior. No acute osseous abnormality. Status post median sternotomy and CABG. IMPRESSION: Interval increase in bilateral patchy airspace opacities (edema/multifocal infection/inflammation), left greater than right with new blunting of left costophrenic angle query atelectasis versus effusion. Electronically Signed   By: Megan  Zare M.D.   On: 10/26/2023 13:49    Lab Results: Personally reviewed CBC    Component Value Date/Time   WBC 9.8 11/10/2023 0602   RBC 3.97 11/10/2023 0602   HGB 11.1 (L) 11/10/2023 0602   HGB 11.1 05/21/2023 0955   HCT 35.9 (L) 11/10/2023 0602  HCT 34.9 05/21/2023 0955   PLT 206 11/10/2023 0602   PLT 315 05/21/2023 0955   MCV 90.4 11/10/2023 0602   MCV 92 05/21/2023 0955   MCH 28.0 11/10/2023 0602   MCHC 30.9 11/10/2023 0602   RDW 16.8 (H) 11/10/2023 0602   RDW 13.5 05/21/2023 0955   LYMPHSABS 1.4 10/28/2023 0358   MONOABS 0.9 10/28/2023 0358   EOSABS 0.0 10/28/2023 0358   BASOSABS 0.0 10/28/2023 0358    BMET    Component Value Date/Time   NA 139 11/10/2023 0602   NA 144 10/09/2023 1027   K 4.1 11/10/2023 0602   CL 93 (L) 11/10/2023 0602   CO2 39 (H) 11/10/2023 0602   GLUCOSE 144 (H) 11/10/2023 0602   BUN 28 (H) 11/10/2023 0602   BUN 27 10/09/2023 1027    CREATININE 0.89 11/10/2023 0602   CALCIUM  9.3 11/10/2023 0602   GFRNONAA >60 11/10/2023 0602   GFRAA 76 03/24/2019 1237    BNP    Component Value Date/Time   BNP 323.5 (H) 10/26/2023 1152    ProBNP    Component Value Date/Time   PROBNP 114.0 (H) 10/21/2021 1227    Specialty Problems       Pulmonary Problems   Sleep apnea-unable to tolerate C-pap   Amiodarone  pulmonary toxicity   Restrictive lung disease   ILD (interstitial lung disease) (HCC)   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 Amiodarone  Other (See Comments)     pulmonary fibrosis  . Gabapentin  Swelling    edema    Immunization History  Administered Date(s) Administered  . Pneumococcal Conjugate-13 12/09/2016    Past Medical History:  Diagnosis Date  . Asthmatic bronchitis   . CHF (congestive heart failure) (HCC) dx'd 2016  . Coronary atherosclerosis of native coronary artery    a. s/p CABG 2005 Dr Dusty    . Hyperlipidemia   . Hypertension   . Hypertensive heart disease   . Kidney stones   . Mixed hyperlipidemia   . Nephrolithiasis 06/07/2015  . Paroxysmal atrial fibrillation (HCC)    a. s/p Maze in 2005 b. recurrence in March 2016 with >8sec posttermination pauses s/p PPM implant    . Pneumonia 1950   double  . Presence of permanent cardiac pacemaker   . Restrictive lung disease 03/03/2018  . Sleep apnea    tested; mask ordered; couldn't afford it; will get it now (09/04/2015)  . TIA (transient ischemic attack)   . Type II diabetes mellitus (HCC)   . Uterine cancer [facility])    age 77 with partial hysterectomy    Tobacco History: Social History   Tobacco Use  Smoking Status Never  Smokeless Tobacco Never   Counseling given: Not Answered   Continue to not smoke  Outpatient Encounter Medications as of 11/25/2023  Medication Sig   . apixaban  (ELIQUIS ) 5 MG TABS tablet TAKE ONE TABLET TWICE DAILY  . B Complex-Biotin-FA (B-COMPLEX PO) Take 1 capsule by mouth every morning.   . benzonatate  (TESSALON ) 100 MG capsule Take 1 capsule (100 mg total) by mouth 2 (two) times daily.  . Biotin 1000 MCG tablet Take 1,000 mcg by mouth daily.  . Chlorpheniramine Maleate (ALLERGY RELIEF PO) Take 2 tablets by mouth as needed (allergies).  . Coenzyme Q10 (CO Q-10) 200 MG CAPS Take 200 mg by mouth daily.  . Evolocumab  (REPATHA   SURECLICK) 140 MG/ML SOAJ Inject 140 mg into the skin every 14 (fourteen) days.  SABRA FARXIGA 10 MG TABS tablet Take 10 mg by mouth daily.  . furosemide  (LASIX ) 40 MG tablet Take 1 tablet (40 mg total) by mouth 2 (two) times daily.  . guaiFENesin -dextromethorphan  (ROBITUSSIN DM) 100-10 MG/5ML syrup Take 5 mLs by mouth every 4 (four) hours as needed for cough.  . HUMALOG  MIX 75/25 KWIKPEN (75-25) 100 UNIT/ML KwikPen Inject 40 Units into the skin in the morning, at noon, and at bedtime.  SABRA ipratropium-albuterol  (DUONEB) 0.5-2.5 (3) MG/3ML SOLN Use 1 vial (3 mLs) by nebulization 4 (four) times daily.  . metolazone (ZAROXOLYN) 2.5 MG tablet Take 2.5 mg by mouth daily as needed (weight gain > 5lbs).  . metoprolol  succinate (TOPROL -XL) 50 MG 24 hr tablet Take 1 tablet (50 mg total) by mouth daily.  . OXYGEN  Inhale 2 L into the lungs continuous.  . predniSONE  (DELTASONE ) 10 MG tablet Take 1 tablet (10 mg total) by mouth daily with breakfast.   No facility-administered encounter medications on file as of 11/25/2023.     Review of Systems  Review of Systems  N/a Physical Exam  BP 138/62   Pulse 89   Ht 5' 3 (1.6 m)   Wt 200 lb 12.8 oz (91.1 kg)   SpO2 99%   BMI 35.57 kg/m   Wt Readings from Last 5 Encounters:  11/25/23 200 lb 12.8 oz (91.1 kg)  10/26/23 207 lb 10.8 oz (94.2 kg)  10/24/23 207 lb 10.8 oz (94.2 kg)  09/28/23 210 lb (95.3 kg)  09/25/23 211 lb (95.7 kg)    BMI Readings from Last 5 Encounters:   11/25/23 35.57 kg/m  10/26/23 36.79 kg/m  10/24/23 36.79 kg/m  09/28/23 37.20 kg/m  09/25/23 37.38 kg/m     Physical Exam General: Sitting in chair, no acute distress Eyes: EOMI, icterus Neck: Supple, no JVP Pulmonary: Faint crackles, otherwise clear, normal work of breathing Cardiovascular: Regular rate and rhythm, no murmur Bowel sounds present MSK: No synovitis, no joint effusion Neuro: Normal gait, no weakness Psych: Normal mood, full affect   Assessment & Plan:   Pulmonary hypertension: Suspect this is multifactorial.  Longstanding evidence of left-sided structural disease or group 2 disease including mitral valve stenosis dating back to 2018, dilated left atrium in 2020, and ongoing dilated left atrium more recently 2023.  There is been RV dysfunction since at least 11/2018 including right atrial pressure elevation at that time.  Other contributors include group 3 disease given her interstitial lung disease on CT scan.  High concern for undiagnosed hypoxemia given significant interstitial changes.   Untreated sleep apnea likely contributing.  Encouraged to resume CPAP therapy.  No known history of VTE.  Low suspicion for Group 1 disease.  Overall, not good candidate for pulmonary vasodilators given left-sided disease as well as her inability to tolerate or use CPAP.   Her right heart catheterization 02/2022 was relatively reassuring but also consistent with the above with elevated wedge, PVR 2.93.5, mildly elevated.  Discussed at length risk of systemic pulm vasodilators given structural heart disease and interstitial lung disease.  Had discussed Tyvaso in the past, she does not want take more medications.  Chronic hypoxemic respiratory failure: Related ILD, waxes and wanes but usually on 2 L.  Interstitial lung disease: On CT scan, worse 2023 compared to 2020.  HSP versus NSIP in my opinion.  Possibly related to prior amiodarone  exposure.  Consider antifibrotic's versus  immunosuppressants in  the future.  Repeat CT scan 11/2021.  PFTs 2023 largely unchanged compared to 2020.  Notably PFTs in 2021 seem spurious with multiple values much higher than these test that preceded and performed after that test in 2021.  Inflammatory markers flat 08/2021.  Think biopsy would be helpful but given her tenuous cardiovascular, pulmonary, and renal disease this is high risk and not recommended at this time.  She seems at peace at this time but we do not have really any options for further evaluation or treatment.  Sarcoidosis possible.  But without biopsy would not pursue treatment.  PET scan could be informative, decrease or increase posttest probability of sarcoidosis but without biopsy would not change treatment decisions.  She does not want to pursue biopsy as she does not want to be put to sleep which has been told its likely she would not wake up from anesthesia.  We discussed risk and benefits of antifibrotic's and at this time she declines.  Repeat CT scan 8/25 will be to CTA PE protocol shows stability of changes.  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  We again discussed steroid sparing agents for ILD, she declines further medications he does not want take any more medications.  Return in about 3 months (around 02/24/2024) for f/u Dr. Annella.   Donnice JONELLE Annella, MD 11/25/2023  I spent 41 minutes in the care of the patient today face-to-face visit, review of records, coordination of care.

## 2023-12-01 ENCOUNTER — Ambulatory Visit (INDEPENDENT_AMBULATORY_CARE_PROVIDER_SITE_OTHER)

## 2023-12-01 DIAGNOSIS — I4821 Permanent atrial fibrillation: Secondary | ICD-10-CM | POA: Diagnosis not present

## 2023-12-02 LAB — CUP PACEART REMOTE DEVICE CHECK
Battery Remaining Longevity: 155 mo
Battery Voltage: 3.2 V
Brady Statistic AP VP Percent: 0 %
Brady Statistic AP VS Percent: 0 %
Brady Statistic AS VP Percent: 98.05 %
Brady Statistic AS VS Percent: 1.95 %
Brady Statistic RA Percent Paced: 0 %
Brady Statistic RV Percent Paced: 98.05 %
Date Time Interrogation Session: 20250915215046
Implantable Lead Connection Status: 753985
Implantable Lead Connection Status: 753985
Implantable Lead Implant Date: 20160321
Implantable Lead Implant Date: 20160321
Implantable Lead Location: 753859
Implantable Lead Location: 753860
Implantable Lead Model: 5076
Implantable Lead Model: 5076
Implantable Pulse Generator Implant Date: 20250312
Lead Channel Impedance Value: 399 Ohm
Lead Channel Impedance Value: 475 Ohm
Lead Channel Impedance Value: 513 Ohm
Lead Channel Impedance Value: 513 Ohm
Lead Channel Pacing Threshold Amplitude: 0.75 V
Lead Channel Pacing Threshold Pulse Width: 0.4 ms
Lead Channel Sensing Intrinsic Amplitude: 0.375 mV
Lead Channel Sensing Intrinsic Amplitude: 18.625 mV
Lead Channel Sensing Intrinsic Amplitude: 18.625 mV
Lead Channel Setting Pacing Amplitude: 2 V
Lead Channel Setting Pacing Pulse Width: 0.4 ms
Lead Channel Setting Sensing Sensitivity: 1.2 mV
Zone Setting Status: 755011

## 2023-12-04 ENCOUNTER — Ambulatory Visit: Payer: Self-pay | Admitting: Internal Medicine

## 2023-12-07 NOTE — Progress Notes (Signed)
 Remote PPM Transmission

## 2023-12-09 ENCOUNTER — Other Ambulatory Visit (HOSPITAL_COMMUNITY): Payer: Self-pay

## 2023-12-18 ENCOUNTER — Encounter (HOSPITAL_BASED_OUTPATIENT_CLINIC_OR_DEPARTMENT_OTHER): Payer: Self-pay

## 2023-12-18 ENCOUNTER — Other Ambulatory Visit (HOSPITAL_BASED_OUTPATIENT_CLINIC_OR_DEPARTMENT_OTHER): Payer: Self-pay

## 2023-12-18 DIAGNOSIS — I050 Rheumatic mitral stenosis: Secondary | ICD-10-CM

## 2023-12-18 DIAGNOSIS — I4821 Permanent atrial fibrillation: Secondary | ICD-10-CM

## 2023-12-18 DIAGNOSIS — I442 Atrioventricular block, complete: Secondary | ICD-10-CM

## 2023-12-18 DIAGNOSIS — E782 Mixed hyperlipidemia: Secondary | ICD-10-CM

## 2023-12-22 ENCOUNTER — Other Ambulatory Visit

## 2023-12-25 ENCOUNTER — Telehealth: Payer: Self-pay

## 2023-12-25 NOTE — Telephone Encounter (Signed)
 I am not aware was managing NIPPV for OSA.  It looks like there is an old sleep apnea test ordered by cardiology in the past.  I would advise that if [employer] needs an order then they contact the person who ordered the BiPAP machine.  Can [employer] tell who ordered the machine? if our office did not order it, they need to reach out to a different office.

## 2023-12-25 NOTE — Telephone Encounter (Signed)
 Copied from CRM #[id number]. Topic: Clinical - Medical Advice >> Dec 21, 2023  1:38 PM Rozanna MATSU wrote: Reason for CRM: pt daughter stated [name] is needing the chart notes showing the patient does need the Bipap machine .   Dr. Annella can you please advise, I am not seeing that we manage her BiPAP.  Lov Untreated sleep apnea likely contributing. Encouraged to resume CPAP therapy. No known history of VTE. Low suspicion for Group 1 disease. Overall, not good candidate for pulmonary vasodilators given left-sided disease as well as her inability to tolerate or use CPAP.

## 2023-12-28 ENCOUNTER — Ambulatory Visit (HOSPITAL_BASED_OUTPATIENT_CLINIC_OR_DEPARTMENT_OTHER): Admitting: Family

## 2023-12-28 ENCOUNTER — Encounter (HOSPITAL_BASED_OUTPATIENT_CLINIC_OR_DEPARTMENT_OTHER): Payer: Self-pay | Admitting: Family

## 2023-12-28 VITALS — BP 120/58 | HR 62 | Ht 63.5 in | Wt 202.6 lb

## 2023-12-28 DIAGNOSIS — I10 Essential (primary) hypertension: Secondary | ICD-10-CM | POA: Diagnosis not present

## 2023-12-28 DIAGNOSIS — I480 Paroxysmal atrial fibrillation: Secondary | ICD-10-CM

## 2023-12-28 DIAGNOSIS — E785 Hyperlipidemia, unspecified: Secondary | ICD-10-CM | POA: Diagnosis not present

## 2023-12-28 DIAGNOSIS — I25118 Atherosclerotic heart disease of native coronary artery with other forms of angina pectoris: Secondary | ICD-10-CM

## 2023-12-28 DIAGNOSIS — D6859 Other primary thrombophilia: Secondary | ICD-10-CM

## 2023-12-28 NOTE — Telephone Encounter (Signed)
 Ok - then can discuss at OV and if using bipap can document in next note and can be sent to [employer].

## 2023-12-28 NOTE — Progress Notes (Unsigned)
 Cardiology Office Note   Date:  12/30/2023  ID:  JOLEIGH MINEAU, DOB 26-Mar-1945, MRN [id number] PCP: Larnell Hamilton, MD  [facility] Providers Cardiologist:  Annabella Scarce, MD Cardiology APP:  Kathrine Jeoffrey POUR, NP (Inactive)  Electrophysiologist:  Danelle Birmingham, MD     History of Present Illness [name] is a 78 y.o. female with hx of CAD s/p CABG, afib s/p MAZE 2005 and AV node ablation and PPM implantation 03/2019, DM, OSA, ILD on home O2, mitral stenosis, pulmonary hypertension, CKDIV, HTN. Amiodarone  previously stopped due to concer nfor pulmonary fibrosis.   Last seen 09/25/23. BP mildly elevated, home monitoring encouraged. Due to statin intolerance, repatha  initiated.   Presents today for follow up. Since last seen admitted 10/2023 with PNA. Notes since stopping prednisone  notes she has used her PRN nebulizer every evening prior to using her BIPAP. She is noting thick yellow phlegm coming up from her chest at night. Continues to wear home oxygen . BP at home routinely 120-130s. Blood sugar has been labile which she attributes to recent utilization of prednisone . Has also had hypoglcemia as low as 42. Tolerates Repahta without issue. Exercsieing at home with physical therapy 12-2 times per week with walking, leg lifts, toe bneds. [facility] nurse comes in once per week.   ROS: Please see the history of present illness.    All other systems reviewed and are negative.   Studies Reviewed      Cardiac Studies & Procedures   ______________________________________________________________________________________________ CARDIAC CATHETERIZATION  CARDIAC CATHETERIZATION 02/26/2022  Conclusion HEMODYNAMICS: Significant respiratory variation, end - expiratory values used. RA:   9 mmHg (mean) RV:   57/6-9 mmHg PA:   57/23 mmHg (34 mean) PCWP:  16 with lage V waves up to  Estimated Fick CO/CI   6.3 L/min, 3.2 L/min/m2 Thermodilution CO/CI   5 L/min, 2.5  L/min/m2  TPG    18  mmHg PVR     2.9-3.6 Wood Units PAPi      3.8   IMPRESSION: Moderately elevated pre-capillary filling pressures and PA mean. Mildly elevated post-capillary filling pressures. Large V waves with no MR on TTE likely suggestive of poor left atrial compliance. Normal cardiac output / cardiac index. 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  RECOMMENDATIONS: - Obtain TTE to evaluate for mitral regurgitation.  Aditya Sabharwal Advanced Heart Failure 1:13 PM  Findings Coronary Findings Diagnostic  Dominance: Right  No diagnostic findings have been documented. Intervention  No interventions have been documented.     ECHOCARDIOGRAM  ECHOCARDIOGRAM COMPLETE 10/22/2023  Narrative ECHOCARDIOGRAM REPORT    Patient Name:   [name] Date of Exam: 10/22/2023 Medical Rec #:  [id number]        Height:       63.0 in Accession #:    [id number]       Weight:       210.5 lb Date of Birth:  31-Mar-1945        BSA:          1.976 m Patient Age:    78 years         BP:           142/59 mmHg Patient Gender: F                HR:           64 bpm. Exam Location:  Inpatient  Procedure: 2D Echo, Cardiac Doppler, Color Doppler  and Intracardiac Opacification Agent (Both Spectral and Color Flow Doppler were utilized during procedure).  Indications:    I50.31 Acute diastolic (congestive) heart failure  History:        Patient has prior history of Echocardiogram examinations, most recent 02/26/2022. Pacemaker and Prior CABG, Arrythmias:Atrial Fibrillation; Risk Factors:Hypertension, Diabetes, Dyslipidemia and Sleep Apnea.  Sonographer:    Damien Senior RDCS Referring Phys: [id number] EVA KATHEE PORE   Sonographer Comments: Technically difficult due to lung interference (active pneumonia) IMPRESSIONS   1. Left ventricular ejection fraction, by estimation, is 60 to 65%. The left  ventricle has normal function. The left ventricle has no regional wall motion abnormalities. There is moderate left ventricular hypertrophy of the basal-septal segment. Left ventricular diastolic function could not be evaluated. 2. Right ventricular systolic function is normal. The right ventricular size is severely enlarged. There is severely elevated pulmonary artery systolic pressure. The estimated right ventricular systolic pressure is 71.6 mmHg. 3. Left atrial size was severely dilated. 4. Right atrial size was severely dilated. 5. The mitral valve is normal in structure. Mild mitral valve regurgitation. Moderate mitral stenosis. The mean mitral valve gradient is 7.5 mmHg. Moderate mitral annular calcification. 6. The aortic valve is tricuspid. Aortic valve regurgitation is mild. Aortic valve sclerosis/calcification is present, without any evidence of aortic stenosis. Aortic valve area, by VTI measures 2.84 cm. Aortic valve mean gradient measures 10.0 mmHg. Aortic valve Vmax measures 2.16 m/s. 7. The inferior vena cava is dilated in size with <50% respiratory variability, suggesting right atrial pressure of 15 mmHg.  FINDINGS Left Ventricle: Left ventricular ejection fraction, by estimation, is 60 to 65%. The left ventricle has normal function. The left ventricle has no regional wall motion abnormalities. Definity  contrast agent was given IV to delineate the left ventricular endocardial borders. The left ventricular internal cavity size was normal in size. There is moderate left ventricular hypertrophy of the basal-septal segment. Left ventricular diastolic function could not be evaluated.  Right Ventricle: The right ventricular size is severely enlarged. No increase in right ventricular wall thickness. Right ventricular systolic function is normal. There is severely elevated pulmonary artery systolic pressure. The tricuspid regurgitant velocity is 3.76 m/s, and with an assumed right atrial  pressure of 15 mmHg, the estimated right ventricular systolic pressure is 71.6 mmHg.  Left Atrium: Left atrial size was severely dilated.  Right Atrium: Right atrial size was severely dilated.  Pericardium: There is no evidence of pericardial effusion.  Mitral Valve: The mitral valve is normal in structure. Moderate mitral annular calcification. Mild mitral valve regurgitation. Moderate mitral valve stenosis. MV peak gradient, 23.3 mmHg. The mean mitral valve gradient is 7.5 mmHg.  Tricuspid Valve: The tricuspid valve is normal in structure. Tricuspid valve regurgitation is mild . No evidence of tricuspid stenosis.  Aortic Valve: The aortic valve is tricuspid. Aortic valve regurgitation is mild. Aortic valve sclerosis/calcification is present, without any evidence of aortic stenosis. Aortic valve mean gradient measures 10.0 mmHg. Aortic valve peak gradient measures 18.6 mmHg. Aortic valve area, by VTI measures 2.84 cm.  Pulmonic Valve: The pulmonic valve was normal in structure. Pulmonic valve regurgitation is mild. No evidence of pulmonic stenosis.  Aorta: The aortic root is normal in size and structure.  Venous: The inferior vena cava is dilated in size with less than 50% respiratory variability, suggesting right atrial pressure of 15 mmHg.  IAS/Shunts: No atrial level shunt detected by color flow Doppler.  Additional Comments: A device lead is visualized.   LEFT VENTRICLE  PLAX 2D LVIDd:         3.70 cm LVIDs:         2.50 cm LV PW:         1.10 cm LV IVS:        1.50 cm LVOT diam:     2.60 cm LV SV:         132 LV SV Index:   67 LVOT Area:     5.31 cm   LEFT ATRIUM              Index        RIGHT ATRIUM           Index LA diam:        4.40 cm  2.23 cm/m   RA Area:     30.50 cm LA Vol (A2C):   105.0 ml 53.13 ml/m  RA Volume:   102.00 ml 51.61 ml/m LA Vol (A4C):   128.0 ml 64.77 ml/m LA Biplane Vol: 117.0 ml 59.20 ml/m AORTIC VALVE AV Area (Vmax):    2.56 cm AV  Area (Vmean):   2.73 cm AV Area (VTI):     2.84 cm AV Vmax:           215.67 cm/s AV Vmean:          146.667 cm/s AV VTI:            0.464 m AV Peak Grad:      18.6 mmHg AV Mean Grad:      10.0 mmHg LVOT Vmax:         104.00 cm/s LVOT Vmean:        75.400 cm/s LVOT VTI:          0.248 m LVOT/AV VTI ratio: 0.53  AORTA Ao Root diam: 3.10 cm Ao Asc diam:  3.70 cm  MITRAL VALVE               TRICUSPID VALVE MV Area (PHT): 1.62 cm    TR Peak grad:   56.6 mmHg MV Area VTI:   1.81 cm    TR Vmax:        376.00 cm/s MV Peak grad:  23.3 mmHg MV Mean grad:  7.5 mmHg    SHUNTS MV Vmax:       2.42 m/s    Systemic VTI:  0.25 m MV Vmean:      116.5 cm/s  Systemic Diam: 2.60 cm  Wilbert Bihari MD Electronically signed by Wilbert Bihari MD Signature Date/Time: 10/22/2023/1:48:06 PM    Final          ______________________________________________________________________________________________      Risk Assessment/Calculations  CHA2DS2-VASc Score = 5   This indicates a 7.2% annual risk of stroke. The patient's score is based upon: CHF History: 1 HTN History: 1 Diabetes History: 1 Stroke History: 0 Vascular Disease History: 0 Age Score: 2 Gender Score: 1            Physical Exam VS:  BP (!) 120/58   Pulse 62   Ht 5' 3.5 (1.613 m)   Wt 202 lb 9.6 oz (91.9 kg)   SpO2 93%   BMI 35.33 kg/m        Wt Readings from Last 3 Encounters:  12/29/23 204 lb (92.5 kg)  12/28/23 202 lb 9.6 oz (91.9 kg)  11/25/23 200 lb 12.8 oz (91.1 kg)    GEN: Well nourished, well developed in no acute distress NECK: No JVD; No carotid bruits CARDIAC:  RRR, no murmurs, rubs, gallops RESPIRATORY:  LLL insp cracks - exp wheez, RLE insp crack ABDOMEN: Soft, non-tender, non-distended EXTREMITIES:  No edema; No deformity   ASSESSMENT AND PLAN  ILD/pulmonary hypertension - new yellow phlegm requiring daily use of PRN nebulizer. Encouraged to schedule sooner follow up with Dr. Annella.   HTN  - BP well controlled. Continue current antihypertensive regimen Lasix  40mg  BID, metoprolol  succinate 50mg  daily.  CAD s/p CABG / HLD, LDL goal <70 / statin intolerance - Stable with no anginal symptoms. No indication for ischemic evaluation.  GDMT metoprolol  50mg  daily, repatha  140mg  q14 days. No asa due to OAC. Had lipid labs this morning, results not yet available.   CKDIII - Careful titration of diuretic and antihypertensive.   HFpEF - Euvolemic and well compensated on exam. GDMT farxiga 10mg  daily, lasix  40mg  BID, metoprolol  succinate 50mg  daily, metolazone PRN.   PAF / Hypercoagulable state - RRR by auscultation. Continue metoprolol  succinate 50mg  daily. CHA2DS2-VASc Score = 5 [CHF History: 1, HTN History: 1, Diabetes History: 1, Stroke History: 0, Vascular Disease History: 0, Age Score: 2, Gender Score: 1].  Therefore, the patient's annual risk of stroke is 7.2 %.    Continue Eliquis  5mg  BID, no bleeding complications, does not meet dose reduction criteria.        Dispo: follow up in 4 mos  Signed, Reche GORMAN Finder, NP

## 2023-12-28 NOTE — Patient Instructions (Signed)
 Medication Instructions:  Continue your current medications.  *If you need a refill on your cardiac medications before your next appointment, please call your pharmacy*  Follow-Up: At [facility], you and your health needs are our priority.  As part of our continuing mission to provide you with exceptional heart care, our providers are all part of one team.  This team includes your primary Cardiologist (physician) and Advanced Practice Providers or APPs (Physician Assistants and Nurse Practitioners) who all work together to provide you with the care you need, when you need it.  Your next appointment:   4 month(s)  Provider:   Annabella Scarce, MD    We recommend signing up for the patient portal called MyChart.  Sign up information is provided on this After Visit Summary.  MyChart is used to connect with patients for Virtual Visits (Telemedicine).  Patients are able to view lab/test results, encounter notes, upcoming appointments, etc.  Non-urgent messages can be sent to your provider as well.   To learn more about what you can do with MyChart, go to [email or web address].   Other Instructions Recommend scheduling follow up with Dr. Annella for breathing difficulties

## 2023-12-28 NOTE — Telephone Encounter (Signed)
 Looks like when she was back In the hosptial in August she was sent home with bipap was seen by dr.mannma in [facility] in September and they need the documentation saying she is using the machine for insurance purposes no one really took responsibility for management looks like,hospital told her pulmonary would handle the bipap hence why they called us ,patient also has appointment tomorrow for acute symptoms after seeing cardiologists

## 2023-12-29 ENCOUNTER — Ambulatory Visit (INDEPENDENT_AMBULATORY_CARE_PROVIDER_SITE_OTHER)

## 2023-12-29 ENCOUNTER — Ambulatory Visit (INDEPENDENT_AMBULATORY_CARE_PROVIDER_SITE_OTHER): Admitting: Pulmonary Disease

## 2023-12-29 ENCOUNTER — Encounter: Payer: Self-pay | Admitting: Pulmonary Disease

## 2023-12-29 VITALS — BP 117/82 | HR 60 | Ht 63.0 in | Wt 204.0 lb

## 2023-12-29 DIAGNOSIS — I5032 Chronic diastolic (congestive) heart failure: Secondary | ICD-10-CM

## 2023-12-29 DIAGNOSIS — J849 Interstitial pulmonary disease, unspecified: Secondary | ICD-10-CM

## 2023-12-29 DIAGNOSIS — J9612 Chronic respiratory failure with hypercapnia: Secondary | ICD-10-CM

## 2023-12-29 DIAGNOSIS — I272 Pulmonary hypertension, unspecified: Secondary | ICD-10-CM | POA: Diagnosis not present

## 2023-12-29 DIAGNOSIS — J42 Unspecified chronic bronchitis: Secondary | ICD-10-CM

## 2023-12-29 DIAGNOSIS — J9611 Chronic respiratory failure with hypoxia: Secondary | ICD-10-CM

## 2023-12-29 LAB — BRAIN NATRIURETIC PEPTIDE: Pro B Natriuretic peptide (BNP): 135 pg/mL — ABNORMAL HIGH (ref 0.0–100.0)

## 2023-12-29 MED ORDER — AZITHROMYCIN 250 MG PO TABS: ORAL_TABLET | ORAL | 0 refills | Status: AC

## 2023-12-29 MED ORDER — PREDNISONE 20 MG PO TABS: ORAL_TABLET | ORAL | 0 refills | Status: AC

## 2023-12-29 NOTE — Telephone Encounter (Signed)
 Recently seen in the office, please send office visit note to [employer] to address this issue.

## 2023-12-29 NOTE — Patient Instructions (Signed)
 Take prednisone  and a Z-Pak as prescribed to see if helps with the congestion in the short-term  In the long-term lets use Breztri 2 puffs twice a day every day, rinse your mouth out with water after use  I provided samples, each sample will last 7 days.  Please send me a message in the next week or so and let me know if you think the inhalers are helping.  If so I can prescribe it.  If not then we will not prescribe it.  We will get blood work and an x-ray today to further evaluate your symptoms  Return to clinic in 3 months or sooner if needed with Dr. Annella

## 2023-12-29 NOTE — Progress Notes (Addendum)
 @Patient  ID: [name], female    DOB: 1945/04/28, 78 y.o.   MRN: [id number]  Chief Complaint  Patient presents with   Medical Management of Chronic Issues    Pt states O2 stats down in the 80's , sputum yellowish      Referring provider: Larnell Hamilton, MD  HPI:   78 y.o. woman whom we are seeing for evaluation of pulmonary hypertension confirmed right heart cath 02/2022, ILD, chronic hypoxemic respiratory failure here for hospital follow-up visit.  Recent cardiology note reviewed.  Returns for follow-up.  More short of breath.  Increased oxygen  need from 2 L up to 3 L.  She clearly is volume overloaded with significant lower extremity swelling.  Lungs are relatively clear.  She has a lot of congestion, phlegm.  We discussed this could be related to underlying ILD or development of asthma.  Prednisone  helped in the past.  We discussed this is okay short-term but we discussed around use to improve this long-term.  Also may improve her shortness of breath and breathing.  She denies any fevers.  No chills.  HPI at initial visit: Patiently recently reestablished with pulmonary clinic.  Increased cough.  Some dyspnea on exertion as well.  In the past and followed with Dr. Geronimo.  CT scan with interstitial changes, concern for hypersensitivity pneumonitis.  Did not follow-up.  Seen recently.  CT high-res shows worsening interlobular septal thickening, mosaicism, scattered nodular opacities concerning for hypersensitivity pneumonitis, sarcoidosis considered.  PA appeared enlarged on that CT scan.  This prompted TTE which demonstrated RV dysfunction, RA dilation.  Review of TTE dating back to 2020 shows RV dysfunction.  Not present in 2018.  She has dyspnea.  With relatively robust exertion.  Normal walking outside bed.  Worse with inclines or stairs.  Worsening a bit over the last several months to year or 2.  PMH: Atrial fibrillation, hypertension, diastolic congestive heart failure,  CAD Surgical history: CABG, cholecystectomy Family history:Mother with hypertension, CAD, hyperlipidemia, father with diabetes, CAD, CVA Social history: Never smoker, lives in [place]     Questionaires / Pulmonary Flowsheets:   ACT:      No data to display          MMRC:     No data to display          Epworth:      No data to display          Tests:   FENO:  No results found for: NITRICOXIDE  PFT:    Latest Ref Rng & Units 08/29/2021    9:55 AM 03/01/2019    8:56 AM 12/12/2018    4:50 PM 12/28/2017    3:47 PM  PFT Results  FVC-Pre L 1.25  1.55  1.23    FVC-Predicted Pre % 45  54  42  56   FVC-Post L 1.25    1.61   FVC-Predicted Post % 45    55   Pre FEV1/FVC % % 93  92  92  91   Post FEV1/FCV % % 93    91   FEV1-Pre L 1.16  1.43  1.12  1.49   FEV1-Predicted Pre % 56  66  52  68   FEV1-Post L 1.17    1.47   DLCO uncorrected ml/min/mmHg 10.88  18.22  11.35  15.97   DLCO UNC% % 57  95  59  67   DLCO corrected ml/min/mmHg 10.88   12.17  DLCO COR %Predicted % 57   64    DLVA Predicted % 122  155  152  121   TLC L 2.71    2.93   TLC % Predicted % 54    58   RV % Predicted % 54    52   02/2019 reviewed and interpreted as spirometry suggestive of moderate restriction, DLCO within normal limits 11/2018 reviewed and interpreted as chronically suggestive of moderate restriction, DLCO moderately reduced 12/2017 reviewed and interpreted as primary suggestive of my restriction versus air trapping, no bronchodilator response, lung volumes consistent with moderate restriction, DLCO moderately reduced 08/2021 reviewed interpreted spirometry suggestive of severe restriction versus air trapping, no bronchodilator response, lung volumes consistent with moderate restriction with moderately reduced DLCO WALK:     06/28/2019   11:15 AM 03/01/2019   10:31 AM 12/28/2018    3:27 PM 02/04/2018    9:39 AM  SIX MIN WALK  Supplimental Oxygen  during Test?  (L/min) No No No No  Tech Comments: slow pace walk, SOB after first lap, three stops to rest, sats WNL normal pace, knee pain, no dsat TA/CMA Pt did not want to continue due to leg pain. Pt did have some dypsnea. Pt ambulated at a slow pace with a steady gait.     Imaging: Personally reviewed CUP PACEART REMOTE DEVICE CHECK Result Date: 12/02/2023 PPM scheduled remote reviewed. Normal device function.  Presenting rhythm: VP 1 Monitored VT event on 10/19/23 x 7 beats @ 171bpm. Next remote 91 days. AB, CVRS   Lab Results: Personally reviewed CBC    Component Value Date/Time   WBC 9.8 11/10/2023 0602   RBC 3.97 11/10/2023 0602   HGB 11.1 (L) 11/10/2023 0602   HGB 11.1 05/21/2023 0955   HCT 35.9 (L) 11/10/2023 0602   HCT 34.9 05/21/2023 0955   PLT 206 11/10/2023 0602   PLT 315 05/21/2023 0955   MCV 90.4 11/10/2023 0602   MCV 92 05/21/2023 0955   MCH 28.0 11/10/2023 0602   MCHC 30.9 11/10/2023 0602   RDW 16.8 (H) 11/10/2023 0602   RDW 13.5 05/21/2023 0955   LYMPHSABS 1.4 10/28/2023 0358   MONOABS 0.9 10/28/2023 0358   EOSABS 0.0 10/28/2023 0358   BASOSABS 0.0 10/28/2023 0358    BMET    Component Value Date/Time   NA 139 11/10/2023 0602   NA 144 10/09/2023 1027   K 4.1 11/10/2023 0602   CL 93 (L) 11/10/2023 0602   CO2 39 (H) 11/10/2023 0602   GLUCOSE 144 (H) 11/10/2023 0602   BUN 28 (H) 11/10/2023 0602   BUN 27 10/09/2023 1027   CREATININE 0.89 11/10/2023 0602   CALCIUM  9.3 11/10/2023 0602   GFRNONAA >60 11/10/2023 0602   GFRAA 76 03/24/2019 1237    BNP    Component Value Date/Time   BNP 323.5 (H) 10/26/2023 1152    ProBNP    Component Value Date/Time   PROBNP 114.0 (H) 10/21/2021 1227    Specialty Problems       Pulmonary Problems   Sleep apnea-unable to tolerate C-pap   Amiodarone  pulmonary toxicity   Restrictive lung disease   ILD (interstitial lung disease) (HCC)   Acute exacerbation of idiopathic pulmonary fibrosis (HCC)   Chronic hypoxic  respiratory failure (HCC)   Shortness of breath   Healthcare-associated pneumonia    Allergies  Allergen Reactions   Amiodarone  Other (See Comments)     pulmonary fibrosis   Gabapentin  Swelling    edema  Immunization History  Administered Date(s) Administered   Pneumococcal Conjugate-13 12/09/2016    Past Medical History:  Diagnosis Date   Asthmatic bronchitis    CHF (congestive heart failure) (HCC) dx'd 2016   Coronary atherosclerosis of native coronary artery    a. s/p CABG 2005 Dr Dusty     Hyperlipidemia    Hypertension    Hypertensive heart disease    Kidney stones    Mixed hyperlipidemia    Nephrolithiasis 06/07/2015   Paroxysmal atrial fibrillation (HCC)    a. s/p Maze in 2005 b. recurrence in March 2016 with >8sec posttermination pauses s/p PPM implant     Pneumonia 1950   double   Presence of permanent cardiac pacemaker    Restrictive lung disease 03/03/2018   Sleep apnea    tested; mask ordered; couldn't afford it; will get it now (09/04/2015)   TIA (transient ischemic attack)    Type II diabetes mellitus (HCC)    Uterine cancer [facility])    age 67 with partial hysterectomy    Tobacco History: Social History   Tobacco Use  Smoking Status Never   Passive exposure: Never  Smokeless Tobacco Never   Counseling given: Not Answered   Continue to not smoke  Outpatient Encounter Medications as of 12/29/2023  Medication Sig   apixaban  (ELIQUIS ) 5 MG TABS tablet TAKE ONE TABLET TWICE DAILY   azithromycin  (ZITHROMAX ) 250 MG tablet Take 2 tablets (500 mg total) by mouth daily for 1 day, THEN 1 tablet (250 mg total) daily for 4 days.   benzonatate  (TESSALON ) 100 MG capsule Take 1 capsule (100 mg total) by mouth 2 (two) times daily.   Biotin 1000 MCG tablet Take 1,000 mcg by mouth daily.   Chlorpheniramine Maleate (ALLERGY RELIEF PO) Take 2 tablets by mouth as needed (allergies).   Coenzyme Q10 (CO Q-10) 200 MG CAPS Take 200 mg by mouth daily.   Evolocumab   (REPATHA  SURECLICK) 140 MG/ML SOAJ Inject 140 mg into the skin every 14 (fourteen) days.   FARXIGA 10 MG TABS tablet Take 10 mg by mouth daily.   furosemide  (LASIX ) 40 MG tablet Take 1 tablet (40 mg total) by mouth 2 (two) times daily.   guaiFENesin -dextromethorphan  (ROBITUSSIN DM) 100-10 MG/5ML syrup Take 5 mLs by mouth every 4 (four) hours as needed for cough.   HUMALOG  MIX 75/25 KWIKPEN (75-25) 100 UNIT/ML KwikPen Inject 40 Units into the skin in the morning, at noon, and at bedtime.   ipratropium-albuterol  (DUONEB) 0.5-2.5 (3) MG/3ML SOLN Use 1 vial (3 mLs) by nebulization 4 (four) times daily.   metolazone (ZAROXOLYN) 2.5 MG tablet Take 2.5 mg by mouth daily as needed (weight gain > 5lbs).   metoprolol  succinate (TOPROL -XL) 50 MG 24 hr tablet Take 1 tablet (50 mg total) by mouth daily.   OXYGEN  Inhale 2-3 L into the lungs continuous.   predniSONE  (DELTASONE ) 20 MG tablet Take 2 tablets (40 mg total) by mouth daily with breakfast for 5 days, THEN 1 tablet (20 mg total) daily with breakfast for 5 days.   No facility-administered encounter medications on file as of 12/29/2023.     Review of Systems  Review of Systems  N/a Physical Exam  BP 117/82   Pulse 60   Ht 5' 3 (1.6 m)   Wt 204 lb (92.5 kg)   SpO2 93%   PF (!) 2 L/min   BMI 36.14 kg/m   Wt Readings from Last 5 Encounters:  12/29/23 204 lb (92.5 kg)  12/28/23 202  lb 9.6 oz (91.9 kg)  11/25/23 200 lb 12.8 oz (91.1 kg)  10/26/23 207 lb 10.8 oz (94.2 kg)  10/24/23 207 lb 10.8 oz (94.2 kg)    BMI Readings from Last 5 Encounters:  12/29/23 36.14 kg/m  12/28/23 35.33 kg/m  11/25/23 35.57 kg/m  10/26/23 36.79 kg/m  10/24/23 36.79 kg/m     Physical Exam General: Sitting in chair, no acute distress Eyes: EOMI, icterus Neck: Supple, no JVP Pulmonary: Faint crackles, otherwise clear, normal work of breathing Cardiovascular: Regular rate and rhythm, no murmur, 1-2+ pitting edema to midshin bilaterally Abdomen:  Bowel sounds present MSK: No synovitis, no joint effusion Neuro: Normal gait, no weakness Psych: Normal mood, full affect   Assessment & Plan:   Pulmonary hypertension: Suspect this is multifactorial.  Longstanding evidence of left-sided structural disease or group 2 disease including mitral valve stenosis dating back to 2018, dilated left atrium in 2020, and ongoing dilated left atrium more recently 2023.  There is been RV dysfunction since at least 11/2018 including right atrial pressure elevation at that time.  Other contributors include group 3 disease given her interstitial lung disease on CT scan.  High concern for undiagnosed hypoxemia given significant interstitial changes.   Untreated sleep apnea likely contributing.  Encouraged to resume CPAP therapy.  No known history of VTE.  Low suspicion for Group 1 disease.  Overall, not good candidate for pulmonary vasodilators given left-sided disease as well as her inability to tolerate or use CPAP.   Her right heart catheterization 02/2022 was relatively reassuring but also consistent with the above with elevated wedge, PVR 2.93 mildly elevated.  Discussed at length risk of systemic pulm vasodilators given structural heart disease and interstitial lung disease.  Had discussed Tyvaso in the past, she does not want take more medications.  Cough, congestion: Improved with prednisone  therapy.  Potentially related to underlying ILD.  Given improvement with prednisone  and air trapping, mosaicism underlying small airway disease or asthma is possible.  Chest x-ray.  Prednisone  taper.  Z-Pak.  Trial of Breztri.  Chronic hypoxemic respiratory failure: Related ILD, waxes and wanes but usually on 2 L.  Mild increase to 3 L.  Chest x-ray for further evaluation.  BNP to evaluate presence of fluid overload given significant lower extremity swelling.  Patient, family present in room plan to address this with additional Lasix  in the coming days.  Interstitial lung  disease: On CT scan, worse 2023 compared to 2020.  HSP versus NSIP in my opinion.  Possibly related to prior amiodarone  exposure.  Consider antifibrotic's versus immunosuppressants in the future.  Repeat CT scan 11/2021.  PFTs 2023 largely unchanged compared to 2020.  Notably PFTs in 2021 seem spurious with multiple values much higher than these test that preceded and performed after that test in 2021.  Inflammatory markers flat 08/2021.  Think biopsy would be helpful but given her tenuous cardiovascular, pulmonary, and renal disease this is high risk and not recommended at this time.  She seems at peace at this time but we do not have really any options for further evaluation or treatment.  Sarcoidosis possible.  But without biopsy would not pursue treatment.  PET scan could be informative, decrease or increase posttest probability of sarcoidosis but without biopsy would not change treatment decisions.  She does not want to pursue biopsy as she does not want to be put to sleep which has been told its likely she would not wake up from anesthesia.  We discussed risk and  benefits of antifibrotic's and at this time she declines.  Repeat CT scan 8/25 will be to CTA PE protocol shows stability of changes.  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  In the past, we have discussed steroid sparing agents for ILD, she declines further medications he does not want take any more medications.  Chronic hypercarbic respiratory failure: Using NIPPV at night.  She need to continue this.  CO2 remains elevated.  She is at high risk of decompensation, hospitalization, and death if NIPPV is not used.  She reports good adherence to this.  Stressed the importance of using NIPPV at night and when she sleeps otherwise.  Return in about 3 months (around 03/30/2024) for f/u Dr. Annella.   Donnice JONELLE Annella, MD 12/29/2023  I spent 42 minutes in the  care of the patient today face-to-face visit, review of records, coordination of care.

## 2023-12-30 ENCOUNTER — Encounter (HOSPITAL_BASED_OUTPATIENT_CLINIC_OR_DEPARTMENT_OTHER): Payer: Self-pay | Admitting: Family

## 2023-12-30 NOTE — Telephone Encounter (Signed)
 LOV notes have been faxed to [facility]. NFN

## 2024-01-18 ENCOUNTER — Telehealth: Payer: Self-pay

## 2024-01-18 ENCOUNTER — Encounter: Payer: Self-pay | Admitting: Pulmonary Disease

## 2024-01-18 NOTE — Telephone Encounter (Signed)
 Can you please send prednisone  10 mg daily, quantity 30, refills 2.  Thank you.

## 2024-01-19 MED ORDER — PREDNISONE 10 MG PO TABS: 10.0000 mg | ORAL_TABLET | Freq: Every day | ORAL | 2 refills | Status: AC

## 2024-02-24 ENCOUNTER — Ambulatory Visit: Admitting: Pulmonary Disease

## 2024-03-01 ENCOUNTER — Ambulatory Visit

## 2024-03-01 DIAGNOSIS — I25118 Atherosclerotic heart disease of native coronary artery with other forms of angina pectoris: Secondary | ICD-10-CM

## 2024-03-03 LAB — CUP PACEART REMOTE DEVICE CHECK
Battery Remaining Longevity: 153 mo
Battery Voltage: 3.17 V
Brady Statistic AP VP Percent: 0 %
Brady Statistic AP VS Percent: 0 %
Brady Statistic AS VP Percent: 97.07 %
Brady Statistic AS VS Percent: 2.93 %
Brady Statistic RA Percent Paced: 0 %
Brady Statistic RV Percent Paced: 97.07 %
Date Time Interrogation Session: 20251215192505
Implantable Lead Connection Status: 753985
Implantable Lead Connection Status: 753985
Implantable Lead Implant Date: 20160321
Implantable Lead Implant Date: 20160321
Implantable Lead Location: 753859
Implantable Lead Location: 753860
Implantable Lead Model: 5076
Implantable Lead Model: 5076
Implantable Pulse Generator Implant Date: 20250312
Lead Channel Impedance Value: 399 Ohm
Lead Channel Impedance Value: 513 Ohm
Lead Channel Impedance Value: 532 Ohm
Lead Channel Impedance Value: 570 Ohm
Lead Channel Pacing Threshold Amplitude: 0.75 V
Lead Channel Pacing Threshold Pulse Width: 0.4 ms
Lead Channel Sensing Intrinsic Amplitude: 0.375 mV
Lead Channel Sensing Intrinsic Amplitude: 24 mV
Lead Channel Sensing Intrinsic Amplitude: 24 mV
Lead Channel Setting Pacing Amplitude: 2 V
Lead Channel Setting Pacing Pulse Width: 0.4 ms
Lead Channel Setting Sensing Sensitivity: 1.2 mV
Zone Setting Status: 755011

## 2024-03-04 NOTE — Progress Notes (Signed)
 Remote PPM Transmission

## 2024-03-13 ENCOUNTER — Ambulatory Visit: Payer: Self-pay | Admitting: Internal Medicine

## 2024-04-07 ENCOUNTER — Encounter: Payer: Self-pay | Admitting: Pulmonary Disease

## 2024-04-07 ENCOUNTER — Ambulatory Visit: Admitting: Pulmonary Disease

## 2024-04-07 VITALS — BP 142/71 | HR 73 | Temp 98.7°F | Ht 63.0 in | Wt 194.0 lb

## 2024-04-07 DIAGNOSIS — J849 Interstitial pulmonary disease, unspecified: Secondary | ICD-10-CM | POA: Diagnosis not present

## 2024-04-07 DIAGNOSIS — I272 Pulmonary hypertension, unspecified: Secondary | ICD-10-CM

## 2024-04-07 DIAGNOSIS — J9612 Chronic respiratory failure with hypercapnia: Secondary | ICD-10-CM

## 2024-04-07 DIAGNOSIS — J9611 Chronic respiratory failure with hypoxia: Secondary | ICD-10-CM | POA: Diagnosis not present

## 2024-04-07 DIAGNOSIS — J42 Unspecified chronic bronchitis: Secondary | ICD-10-CM

## 2024-04-07 NOTE — Progress Notes (Addendum)
 "  @Patient  ID: [name], female    DOB: 22-Jul-1945, 79 y.o.   MRN: [id number]  Chief Complaint  Patient presents with   Interstitial Lung Disease    Pt states since LOV breathing has been okay SOB only occurs if pt gains a lot of fluid pt stated Prod cough occurs in the morning ( phlegm white) Pt is currently on 2l of 02 on poc    Referring provider: Larnell Hamilton, MD  HPI:   79 y.o. woman whom we are seeing for evaluation of pulmonary hypertension confirmed right heart cath 02/2022, ILD, chronic hypoxemic respiratory failure.    Returns for routine her scheduled follow-up.  Breathing relatively stable.  Using 2 L on POC.  Improved from 3 L prior.  At last visit.  The was after recent hospitalization for fluid overload, CHF.  She feels her breathing has improved.  She notes that her breathing reliably worsens with swelling.  Fluid overload.  She feels much better on prednisone .  Currently on 10 mg daily.  Improvement in chest tightness, breathing.  We discussed continuing for now.  Recent URI.  If stable at next visit, need to discuss titrating down slowly to find lowest effective dose in effort to minimize side effects.  HPI at initial visit: Patiently recently reestablished with pulmonary clinic.  Increased cough.  Some dyspnea on exertion as well.  In the past and followed with Dr. Geronimo.  CT scan with interstitial changes, concern for hypersensitivity pneumonitis.  Did not follow-up.  Seen recently.  CT high-res shows worsening interlobular septal thickening, mosaicism, scattered nodular opacities concerning for hypersensitivity pneumonitis, sarcoidosis considered.  PA appeared enlarged on that CT scan.  This prompted TTE which demonstrated RV dysfunction, RA dilation.  Review of TTE dating back to 2020 shows RV dysfunction.  Not present in 2018.  She has dyspnea.  With relatively robust exertion.  Normal walking outside bed.  Worse with inclines or stairs.  Worsening a bit  over the last several months to year or 2.  PMH: Atrial fibrillation, hypertension, diastolic congestive heart failure, CAD Surgical history: CABG, cholecystectomy Family history:Mother with hypertension, CAD, hyperlipidemia, father with diabetes, CAD, CVA Social history: Never smoker, lives in  [place]     Questionaires / Pulmonary Flowsheets:   ACT:      No data to display          MMRC:     No data to display          Epworth:      No data to display          Tests:   FENO:  No results found for: NITRICOXIDE  PFT:    Latest Ref Rng & Units 08/29/2021    9:55 AM 03/01/2019    8:56 AM 12/12/2018    4:50 PM 12/28/2017    3:47 PM  PFT Results  FVC-Pre L 1.25  1.55  1.23    FVC-Predicted Pre % 45  54  42  56   FVC-Post L 1.25    1.61   FVC-Predicted Post % 45    55   Pre FEV1/FVC % % 93  92  92  91   Post FEV1/FCV % % 93    91   FEV1-Pre L 1.16  1.43  1.12  1.49   FEV1-Predicted Pre % 56  66  52  68   FEV1-Post L 1.17    1.47   DLCO uncorrected ml/min/mmHg 10.88  18.22  11.35  15.97   DLCO UNC% % 57  95  59  67   DLCO corrected ml/min/mmHg 10.88   12.17    DLCO COR %Predicted % 57   64    DLVA Predicted % 122  155  152  121   TLC L 2.71    2.93   TLC % Predicted % 54    58   RV % Predicted % 54    52   02/2019 reviewed and interpreted as spirometry suggestive of moderate restriction, DLCO within normal limits 11/2018 reviewed and interpreted as chronically suggestive of moderate restriction, DLCO moderately reduced 12/2017 reviewed and interpreted as primary suggestive of my restriction versus air trapping, no bronchodilator response, lung volumes consistent with moderate restriction, DLCO moderately reduced 08/2021 reviewed interpreted spirometry suggestive of severe restriction versus air trapping, no bronchodilator response, lung volumes consistent with moderate restriction with moderately reduced DLCO WALK:     06/28/2019   11:15  AM 03/01/2019   10:31 AM 12/28/2018    3:27 PM 02/04/2018    9:39 AM  SIX MIN WALK  Supplimental Oxygen  during Test? (L/min) No No No No  Tech Comments: slow pace walk, SOB after first lap, three stops to rest, sats WNL normal pace, knee pain, no dsat TA/CMA Pt did not want to continue due to leg pain. Pt did have some dypsnea. Pt ambulated at a slow pace with a steady gait.     Imaging: Personally reviewed No results found.   Lab Results: Personally reviewed CBC    Component Value Date/Time   WBC 9.8 11/10/2023 0602   RBC 3.97 11/10/2023 0602   HGB 11.1 (L) 11/10/2023 0602   HGB 11.1 05/21/2023 0955   HCT 35.9 (L) 11/10/2023 0602   HCT 34.9 05/21/2023 0955   PLT 206 11/10/2023 0602   PLT 315 05/21/2023 0955   MCV 90.4 11/10/2023 0602   MCV 92 05/21/2023 0955   MCH 28.0 11/10/2023 0602   MCHC 30.9 11/10/2023 0602   RDW 16.8 (H) 11/10/2023 0602   RDW 13.5 05/21/2023 0955   LYMPHSABS 1.4 10/28/2023 0358   MONOABS 0.9 10/28/2023 0358   EOSABS 0.0 10/28/2023 0358   BASOSABS 0.0 10/28/2023 0358    BMET    Component Value Date/Time   NA 139 11/10/2023 0602   NA 144 10/09/2023 1027   K 4.1 11/10/2023 0602   CL 93 (L) 11/10/2023 0602   CO2 39 (H) 11/10/2023 0602   GLUCOSE 144 (H) 11/10/2023 0602   BUN 28 (H) 11/10/2023 0602   BUN 27 10/09/2023 1027   CREATININE 0.89 11/10/2023 0602   CALCIUM  9.3 11/10/2023 0602   GFRNONAA >60 11/10/2023 0602   GFRAA 76 03/24/2019 1237    BNP    Component Value Date/Time   BNP 323.5 (H) 10/26/2023 1152    ProBNP    Component Value Date/Time   PROBNP 135.0 (H) 12/29/2023 1142    Specialty Problems       Pulmonary Problems   Sleep apnea-unable to tolerate C-pap   Amiodarone  pulmonary toxicity   Restrictive lung disease   ILD (interstitial lung disease) (HCC)   Acute exacerbation of idiopathic pulmonary fibrosis (HCC)   Chronic hypoxic respiratory failure (HCC)   Shortness of breath   Healthcare-associated pneumonia     Allergies  Allergen Reactions   Amiodarone  Other (See Comments)     pulmonary fibrosis   Gabapentin  Swelling    edema    Immunization History  Administered Date(s) Administered  Pneumococcal Conjugate-13 12/09/2016    Past Medical History:  Diagnosis Date   Asthmatic bronchitis    CHF (congestive heart failure) (HCC) dx'd 2016   Coronary atherosclerosis of native coronary artery    a. s/p CABG 2005 Dr Dusty     Hyperlipidemia    Hypertension    Hypertensive heart disease    Kidney stones    Mixed hyperlipidemia    Nephrolithiasis 06/07/2015   Paroxysmal atrial fibrillation (HCC)    a. s/p Maze in 2005 b. recurrence in March 2016 with >8sec posttermination pauses s/p PPM implant     Pneumonia 1950   double   Presence of permanent cardiac pacemaker    Restrictive lung disease 03/03/2018   Sleep apnea    tested; mask ordered; couldn't afford it; will get it now (09/04/2015)   TIA (transient ischemic attack)    Type II diabetes mellitus (HCC)    Uterine cancer [facility])    age 70 with partial hysterectomy    Tobacco History: Social History   Tobacco Use  Smoking Status Never   Passive exposure: Never  Smokeless Tobacco Never   Counseling given: Not Answered   Continue to not smoke  Outpatient Encounter Medications as of 04/07/2024  Medication Sig   apixaban  (ELIQUIS ) 5 MG TABS tablet TAKE ONE TABLET TWICE DAILY   Biotin 1000 MCG tablet Take 1,000 mcg by mouth daily.   Chlorpheniramine Maleate (ALLERGY RELIEF PO) Take 2 tablets by mouth as needed (allergies).   Coenzyme Q10 (CO Q-10) 200 MG CAPS Take 200 mg by mouth daily.   Evolocumab  (REPATHA  SURECLICK) 140 MG/ML SOAJ Inject 140 mg into the skin every 14 (fourteen) days.   FARXIGA 10 MG TABS tablet Take 10 mg by mouth daily.   furosemide  (LASIX ) 40 MG tablet Take 1 tablet (40 mg total) by mouth 2 (two) times daily.   HUMALOG  MIX 75/25 KWIKPEN (75-25) 100 UNIT/ML KwikPen Inject 40 Units into the skin in the  morning, at noon, and at bedtime.   metolazone (ZAROXOLYN) 2.5 MG tablet Take 2.5 mg by mouth daily as needed (weight gain > 5lbs).   metoprolol  succinate (TOPROL -XL) 50 MG 24 hr tablet Take 1 tablet (50 mg total) by mouth daily.   OXYGEN  Inhale 2-3 L into the lungs continuous.   predniSONE  (DELTASONE ) 10 MG tablet Take 1 tablet (10 mg total) by mouth daily with breakfast.   benzonatate  (TESSALON ) 100 MG capsule Take 1 capsule (100 mg total) by mouth 2 (two) times daily. (Patient not taking: Reported on 04/07/2024)   guaiFENesin -dextromethorphan  (ROBITUSSIN DM) 100-10 MG/5ML syrup Take 5 mLs by mouth every 4 (four) hours as needed for cough. (Patient not taking: Reported on 04/07/2024)   ipratropium-albuterol  (DUONEB) 0.5-2.5 (3) MG/3ML SOLN Use 1 vial (3 mLs) by nebulization 4 (four) times daily. (Patient not taking: Reported on 04/07/2024)   No facility-administered encounter medications on file as of 04/07/2024.     Review of Systems  Review of Systems  N/a Physical Exam  BP (!) 142/71   Pulse 73   Temp 98.7 F (37.1 C) (Oral)   Ht 5' 3 (1.6 m) Comment: per patient  Wt 194 lb (88 kg) Comment: per patient  SpO2 96% Comment: on RA  BMI 34.37 kg/m   Wt Readings from Last 5 Encounters:  04/07/24 194 lb (88 kg)  12/29/23 204 lb (92.5 kg)  12/28/23 202 lb 9.6 oz (91.9 kg)  11/25/23 200 lb 12.8 oz (91.1 kg)  10/26/23 207 lb 10.8 oz (  94.2 kg)    BMI Readings from Last 5 Encounters:  04/07/24 34.37 kg/m  12/29/23 36.14 kg/m  12/28/23 35.33 kg/m  11/25/23 35.57 kg/m  10/26/23 36.79 kg/m     Physical Exam General: In wheelchair no distress Eyes: No icterus Neck: No JVP upright Pulmonary: Work of breathing, fine crackles in the bases, otherwise clear Cardiovascular: Regular rate rate and rhythm, no murmur, trace edema improved from prior   Assessment & Plan:   Pulmonary hypertension: Suspect this is multifactorial.  Longstanding evidence of left-sided structural  disease or group 2 disease including mitral valve stenosis dating back to 2018, dilated left atrium in 2020, and ongoing dilated left atrium more recently 2023.  There is been RV dysfunction since at least 11/2018 including right atrial pressure elevation at that time.  Other contributors include group 3 disease given her interstitial lung disease on CT scan.  High concern for undiagnosed hypoxemia given significant interstitial changes.   Untreated sleep apnea likely contributing.  Encouraged to resume CPAP therapy.  No known history of VTE.  Low suspicion for Group 1 disease.  Overall, not good candidate for pulmonary vasodilators given group 2 disease.  Her right heart catheterization 02/2022 was relatively reassuring but also consistent with the above with elevated wedge, PVR 2.93 mildly elevated.  Discussed at length risk of systemic pulm vasodilators given structural heart disease and interstitial lung disease.  Given her PVR mildly elevated, no role for Tyvaso.  Chronic hypoxemic respiratory failure: Related ILD, waxes and wanes but usually on 2 L.  Stable on 2 L.  Worsens with volume overload.  Currently appears relatively euvolemic.  Interstitial lung disease: On CT scan, worse 2023 compared to 2020.  HSP versus NSIP in my opinion.  Possibly related to prior amiodarone  exposure.  Consider antifibrotic's versus immunosuppressants in the future.  Repeat CT scan 11/2021.  PFTs 2023 largely unchanged compared to 2020.  Notably PFTs in 2021 seem spurious with multiple values much higher than these test that preceded and performed after that test in 2021.  Inflammatory markers flat 08/2021.  Think biopsy would be helpful but given her tenuous cardiovascular, pulmonary, and renal disease this is high risk and not recommended at this time.  She seems at peace at this time but we do not have really any options for further evaluation or treatment.  Sarcoidosis possible.  But without biopsy would not pursue  treatment.  PET scan could be informative, decrease or increase posttest probability of sarcoidosis but without biopsy would not change treatment decisions.  She does not want to pursue biopsy as she does not want to be put to sleep which has been told its likely she would not wake up from anesthesia.  Repeat CT scan 8/25 will be to CTA PE protocol shows stability of changes.  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  Feels better on steroids, currently prednisone  10 mg daily.  She is declined steroid sparing agents.  She has declined antifibrotic's.  We discussed continuing prednisone  dose for now and that in the near future start to decrease and find the lowest dose tolerable.  Chronic hypercarbic respiratory failure: Compliance report reviewed, using every night, on average nearly 7 hours.  Also using with naps during the day.  She need to continue this.  CO2 remains elevated.  She is at high risk of decompensation, hospitalization, and death if NIPPV is not used.  Stressed the importance of using  NIPPV at night and when she sleeps otherwise.  Continue Breztri.  Return in about 3 months (around 07/06/2024) for f/u Dr. Annella.   Dana JONELLE Annella, MD 04/07/2024  "

## 2024-05-04 ENCOUNTER — Ambulatory Visit (HOSPITAL_BASED_OUTPATIENT_CLINIC_OR_DEPARTMENT_OTHER): Admitting: Cardiovascular Disease

## 2024-05-31 ENCOUNTER — Encounter

## 2024-08-30 ENCOUNTER — Encounter

## 2024-11-29 ENCOUNTER — Encounter

## 2025-02-28 ENCOUNTER — Encounter

## 2025-05-30 ENCOUNTER — Encounter
